# Patient Record
Sex: Male | Born: 1985
Health system: Southern US, Community
[De-identification: ages and names within clinical notes are randomized; demographics above are authoritative.]

## PROBLEM LIST (undated history)

## (undated) DIAGNOSIS — E669 Obesity, unspecified: Secondary | ICD-10-CM

## (undated) DIAGNOSIS — E7800 Pure hypercholesterolemia, unspecified: Secondary | ICD-10-CM

## (undated) DIAGNOSIS — G4733 Obstructive sleep apnea (adult) (pediatric): Secondary | ICD-10-CM

## (undated) DIAGNOSIS — F1910 Other psychoactive substance abuse, uncomplicated: Secondary | ICD-10-CM

## (undated) DIAGNOSIS — M1990 Unspecified osteoarthritis, unspecified site: Secondary | ICD-10-CM

## (undated) DIAGNOSIS — I10 Essential (primary) hypertension: Secondary | ICD-10-CM

## (undated) DIAGNOSIS — I509 Heart failure, unspecified: Secondary | ICD-10-CM

## (undated) DIAGNOSIS — J42 Unspecified chronic bronchitis: Secondary | ICD-10-CM

## (undated) DIAGNOSIS — Z9989 Dependence on other enabling machines and devices: Secondary | ICD-10-CM

## (undated) DIAGNOSIS — J189 Pneumonia, unspecified organism: Secondary | ICD-10-CM

## (undated) DIAGNOSIS — Z720 Tobacco use: Secondary | ICD-10-CM

## (undated) HISTORY — PX: NO PAST SURGERIES: SHX2092

## (undated) HISTORY — PX: OTHER SURGICAL HISTORY: SHX169

---
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
## 2004-01-04 ENCOUNTER — Emergency Department (HOSPITAL_COMMUNITY): Admission: EM | Admit: 2004-01-04 | Discharge: 2004-01-04 | Payer: Self-pay | Admitting: Emergency Medicine

## 2009-05-02 ENCOUNTER — Emergency Department (HOSPITAL_COMMUNITY): Admission: EM | Admit: 2009-05-02 | Discharge: 2009-05-02 | Payer: Self-pay | Admitting: Emergency Medicine

## 2009-05-04 ENCOUNTER — Emergency Department (HOSPITAL_COMMUNITY): Admission: EM | Admit: 2009-05-04 | Discharge: 2009-05-04 | Payer: Self-pay | Admitting: Emergency Medicine

## 2009-08-14 ENCOUNTER — Ambulatory Visit (HOSPITAL_BASED_OUTPATIENT_CLINIC_OR_DEPARTMENT_OTHER): Admission: RE | Admit: 2009-08-14 | Discharge: 2009-08-14 | Payer: Self-pay | Admitting: Internal Medicine

## 2009-08-22 ENCOUNTER — Ambulatory Visit: Payer: Self-pay | Admitting: Internal Medicine

## 2009-09-17 ENCOUNTER — Emergency Department (HOSPITAL_COMMUNITY): Admission: EM | Admit: 2009-09-17 | Discharge: 2009-09-17 | Payer: Self-pay | Admitting: Family Medicine

## 2010-04-14 ENCOUNTER — Emergency Department (HOSPITAL_COMMUNITY): Payer: Medicaid Other

## 2010-04-14 ENCOUNTER — Emergency Department (HOSPITAL_COMMUNITY)
Admission: EM | Admit: 2010-04-14 | Discharge: 2010-04-14 | Disposition: A | Discharge status: Home / Self Care | Payer: Medicaid Other | Attending: Emergency Medicine | Admitting: Emergency Medicine

## 2010-04-14 DIAGNOSIS — I10 Essential (primary) hypertension: Secondary | ICD-10-CM | POA: Insufficient documentation

## 2010-04-14 DIAGNOSIS — J40 Bronchitis, not specified as acute or chronic: Secondary | ICD-10-CM | POA: Insufficient documentation

## 2010-04-14 DIAGNOSIS — R062 Wheezing: Secondary | ICD-10-CM | POA: Insufficient documentation

## 2010-04-14 DIAGNOSIS — R059 Cough, unspecified: Secondary | ICD-10-CM | POA: Insufficient documentation

## 2010-04-14 DIAGNOSIS — R05 Cough: Secondary | ICD-10-CM | POA: Insufficient documentation

## 2011-09-25 DIAGNOSIS — J189 Pneumonia, unspecified organism: Secondary | ICD-10-CM

## 2011-09-25 HISTORY — DX: Pneumonia, unspecified organism: J18.9

## 2011-09-27 ENCOUNTER — Other Ambulatory Visit: Payer: Self-pay

## 2011-09-27 ENCOUNTER — Inpatient Hospital Stay (HOSPITAL_COMMUNITY)
Admission: EM | Admit: 2011-09-27 | Discharge: 2011-10-02 | DRG: 189 | Disposition: A | Discharge status: Home / Self Care | Payer: Medicaid Other | Attending: Internal Medicine | Admitting: Internal Medicine

## 2011-09-27 ENCOUNTER — Encounter (HOSPITAL_COMMUNITY): Payer: Self-pay

## 2011-09-27 ENCOUNTER — Emergency Department (HOSPITAL_COMMUNITY): Payer: Medicaid Other

## 2011-09-27 DIAGNOSIS — J9620 Acute and chronic respiratory failure, unspecified whether with hypoxia or hypercapnia: Principal | ICD-10-CM | POA: Diagnosis present

## 2011-09-27 DIAGNOSIS — F1910 Other psychoactive substance abuse, uncomplicated: Secondary | ICD-10-CM

## 2011-09-27 DIAGNOSIS — F17200 Nicotine dependence, unspecified, uncomplicated: Secondary | ICD-10-CM | POA: Diagnosis present

## 2011-09-27 DIAGNOSIS — R209 Unspecified disturbances of skin sensation: Secondary | ICD-10-CM | POA: Diagnosis present

## 2011-09-27 DIAGNOSIS — R0902 Hypoxemia: Secondary | ICD-10-CM

## 2011-09-27 DIAGNOSIS — D72829 Elevated white blood cell count, unspecified: Secondary | ICD-10-CM | POA: Diagnosis present

## 2011-09-27 DIAGNOSIS — Z6841 Body Mass Index (BMI) 40.0 and over, adult: Secondary | ICD-10-CM

## 2011-09-27 DIAGNOSIS — D751 Secondary polycythemia: Secondary | ICD-10-CM | POA: Diagnosis present

## 2011-09-27 DIAGNOSIS — E669 Obesity, unspecified: Secondary | ICD-10-CM

## 2011-09-27 DIAGNOSIS — J9622 Acute and chronic respiratory failure with hypercapnia: Secondary | ICD-10-CM

## 2011-09-27 DIAGNOSIS — I10 Essential (primary) hypertension: Secondary | ICD-10-CM | POA: Diagnosis present

## 2011-09-27 DIAGNOSIS — J209 Acute bronchitis, unspecified: Secondary | ICD-10-CM

## 2011-09-27 DIAGNOSIS — J189 Pneumonia, unspecified organism: Secondary | ICD-10-CM | POA: Diagnosis present

## 2011-09-27 DIAGNOSIS — R0602 Shortness of breath: Secondary | ICD-10-CM

## 2011-09-27 DIAGNOSIS — Z91199 Patient's noncompliance with other medical treatment and regimen due to unspecified reason: Secondary | ICD-10-CM

## 2011-09-27 DIAGNOSIS — J9600 Acute respiratory failure, unspecified whether with hypoxia or hypercapnia: Secondary | ICD-10-CM

## 2011-09-27 DIAGNOSIS — Z720 Tobacco use: Secondary | ICD-10-CM

## 2011-09-27 DIAGNOSIS — M129 Arthropathy, unspecified: Secondary | ICD-10-CM | POA: Diagnosis present

## 2011-09-27 DIAGNOSIS — F1410 Cocaine abuse, uncomplicated: Secondary | ICD-10-CM | POA: Diagnosis present

## 2011-09-27 DIAGNOSIS — Z9989 Dependence on other enabling machines and devices: Secondary | ICD-10-CM

## 2011-09-27 DIAGNOSIS — G4733 Obstructive sleep apnea (adult) (pediatric): Secondary | ICD-10-CM | POA: Diagnosis present

## 2011-09-27 DIAGNOSIS — J9601 Acute respiratory failure with hypoxia: Secondary | ICD-10-CM

## 2011-09-27 DIAGNOSIS — Z9119 Patient's noncompliance with other medical treatment and regimen: Secondary | ICD-10-CM

## 2011-09-27 DIAGNOSIS — Z79899 Other long term (current) drug therapy: Secondary | ICD-10-CM

## 2011-09-27 HISTORY — DX: Dependence on other enabling machines and devices: Z99.89

## 2011-09-27 HISTORY — DX: Other psychoactive substance abuse, uncomplicated: F19.10

## 2011-09-27 HISTORY — DX: Essential (primary) hypertension: I10

## 2011-09-27 HISTORY — DX: Unspecified osteoarthritis, unspecified site: M19.90

## 2011-09-27 HISTORY — DX: Obesity, unspecified: E66.9

## 2011-09-27 HISTORY — DX: Obstructive sleep apnea (adult) (pediatric): G47.33

## 2011-09-27 HISTORY — DX: Tobacco use: Z72.0

## 2011-09-27 LAB — CBC WITH DIFFERENTIAL/PLATELET
Basophils Absolute: 0.1 10*3/uL (ref 0.0–0.1)
Basophils Relative: 1 % (ref 0–1)
Eosinophils Absolute: 0.1 10*3/uL (ref 0.0–0.7)
HCT: 60 % — ABNORMAL HIGH (ref 39.0–52.0)
Hemoglobin: 19.8 g/dL — ABNORMAL HIGH (ref 13.0–17.0)
MCH: 31.5 pg (ref 26.0–34.0)
MCHC: 33 g/dL (ref 30.0–36.0)
Monocytes Absolute: 1.5 10*3/uL — ABNORMAL HIGH (ref 0.1–1.0)
Monocytes Relative: 12 % (ref 3–12)
Neutro Abs: 8.7 10*3/uL — ABNORMAL HIGH (ref 1.7–7.7)
Neutrophils Relative %: 68 % (ref 43–77)
RDW: 14.1 % (ref 11.5–15.5)

## 2011-09-27 LAB — BASIC METABOLIC PANEL
BUN: 11 mg/dL (ref 6–23)
Calcium: 9.3 mg/dL (ref 8.4–10.5)
Creatinine, Ser: 1.21 mg/dL (ref 0.50–1.35)
GFR calc Af Amer: >90 mL/min (ref >90)
GFR calc non Af Amer: 80 mL/min — ABNORMAL LOW (ref >90)

## 2011-09-27 LAB — POCT I-STAT 3, ART BLOOD GAS (G3+)
Acid-Base Excess: 6 mmol/L — ABNORMAL HIGH (ref 0.0–2.0)
Acid-Base Excess: 7 mmol/L — ABNORMAL HIGH (ref 0.0–2.0)
O2 Saturation: 84 %
TCO2: 39 mmol/L (ref 0–100)
TCO2: 40 mmol/L (ref 0–100)
pCO2 arterial: 72.2 mmHg (ref 35.0–45.0)
pH, Arterial: 7.306 — ABNORMAL LOW (ref 7.350–7.450)

## 2011-09-27 LAB — TROPONIN I: Troponin I: <0.3 ng/mL (ref <0.30)

## 2011-09-27 MED ORDER — DEXTROSE 5 % IV SOLN
1.0000 g | Freq: Once | INTRAVENOUS | Status: AC
  Administered 2011-09-27: 1 g via INTRAVENOUS
  Filled 2011-09-27: qty 10

## 2011-09-27 MED ORDER — IPRATROPIUM BROMIDE 0.02 % IN SOLN
0.5000 mg | Freq: Once | RESPIRATORY_TRACT | Status: AC
  Administered 2011-09-27: 0.5 mg via RESPIRATORY_TRACT
  Filled 2011-09-27: qty 2.5

## 2011-09-27 MED ORDER — ALBUTEROL SULFATE (5 MG/ML) 0.5% IN NEBU
5.0000 mg | INHALATION_SOLUTION | Freq: Once | RESPIRATORY_TRACT | Status: AC
  Administered 2011-09-27: 5 mg via RESPIRATORY_TRACT
  Filled 2011-09-27: qty 1

## 2011-09-27 MED ORDER — SODIUM CHLORIDE 0.9 % IV BOLUS (SEPSIS)
1000.0000 mL | Freq: Once | INTRAVENOUS | Status: AC
  Administered 2011-09-27: 1000 mL via INTRAVENOUS

## 2011-09-27 MED ORDER — DEXTROSE 5 % IV SOLN
500.0000 mg | Freq: Once | INTRAVENOUS | Status: AC
  Administered 2011-09-27: 500 mg via INTRAVENOUS
  Filled 2011-09-27: qty 500

## 2011-09-27 MED ORDER — IOHEXOL 350 MG/ML SOLN
100.0000 mL | Freq: Once | INTRAVENOUS | Status: AC | PRN
  Administered 2011-09-27: 100 mL via INTRAVENOUS

## 2011-09-27 MED ORDER — PREDNISONE 20 MG PO TABS
60.0000 mg | ORAL_TABLET | Freq: Once | ORAL | Status: AC
  Administered 2011-09-27: 60 mg via ORAL
  Filled 2011-09-27: qty 3

## 2011-09-27 MED ORDER — ALBUTEROL SULFATE (5 MG/ML) 0.5% IN NEBU
5.0000 mg | INHALATION_SOLUTION | RESPIRATORY_TRACT | Status: AC
  Administered 2011-09-27: 5 mg via RESPIRATORY_TRACT
  Filled 2011-09-27: qty 1

## 2011-09-27 NOTE — ED Notes (Signed)
Pt noted to be diaphoretic, vital WNL

## 2011-09-27 NOTE — ED Notes (Signed)
Orvan Falconer, Internal Medicine MD at bedside, phlebotomy drawing blood cultures

## 2011-09-27 NOTE — ED Provider Notes (Signed)
History     CSN: [id number]  Arrival date & time 09/27/11  1304   First MD Initiated Contact with Patient 09/27/11 1340      Chief Complaint  Patient presents with  . Shortness of Breath  . Hypertension    (Consider location/radiation/quality/duration/timing/severity/associated sxs/prior treatment) HPI Comments: Patient presents with cough chest congestion and shortness of breath has been going on for the last 2-3 days. He states the cough has been going on for about 2 weeks. Indeed he worsens then. He's noticed that his breathing is getting worse of the last 2 days. He has been taking albuterol inhalers but recently ran out of his inhaler this morning. He was previously on blood pressure medicine but had stopped taking it for about 2 months and just recently started taking it again about a week ago. He states he has subjective fever yesterday but denies any fever today. Denies any pain in his chest. Denies any leg pain or swelling. Denies any pleuritic-type pain. Denies any nausea vomiting or diarrhea.  Patient is a 26 y.o. male presenting with shortness of breath and hypertension.  Shortness of Breath  Associated symptoms include a fever, rhinorrhea, cough and shortness of breath. Pertinent negatives include no chest pain.  Hypertension Associated symptoms include shortness of breath. Pertinent negatives include no chest pain, no abdominal pain and no headaches.    Past Medical History  Diagnosis Date  . Arthritis   . Bronchitis   . Hypertension     History reviewed. No pertinent past surgical history.  No family history on file.  History  Substance Use Topics  . Smoking status: Former Games developer  . Smokeless tobacco: Not on file  . Alcohol Use: Yes     rarely      Review of Systems  Constitutional: Positive for fever and fatigue. Negative for chills and diaphoresis.  HENT: Positive for congestion and rhinorrhea. Negative for sneezing.   Eyes: Negative.   Respiratory:  Positive for cough and shortness of breath. Negative for chest tightness.   Cardiovascular: Negative for chest pain and leg swelling.  Gastrointestinal: Negative for nausea, vomiting, abdominal pain, diarrhea and blood in stool.  Genitourinary: Negative for frequency, hematuria, flank pain and difficulty urinating.  Musculoskeletal: Negative for back pain and arthralgias.  Skin: Negative for rash.  Neurological: Negative for dizziness, speech difficulty, weakness, numbness and headaches.    Allergies  Review of patient's allergies indicates no known allergies.  Home Medications   Current Outpatient Rx  Name Route Sig Dispense Refill  . AMITRIPTYLINE HCL 25 MG PO TABS Oral Take 25 mg by mouth at bedtime.    Marland Kitchen LISINOPRIL-HYDROCHLOROTHIAZIDE 20-12.5 MG PO TABS Oral Take 1 tablet by mouth daily.    Marland Kitchen METOPROLOL SUCCINATE ER 25 MG PO TB24 Oral Take 25 mg by mouth daily.      BP 149/100  Pulse 103  Temp 98.8 F (37.1 C) (Oral)  Resp 18  SpO2 93%  Physical Exam  Constitutional: He is oriented to person, place, and time. He appears well-developed and well-nourished.       The patient's initial oxygen saturation is 86% on room air in the mid 90s on nasal cannula 4 L per minute  HENT:  Head: Normocephalic and atraumatic.  Eyes: Pupils are equal, round, and reactive to light.  Neck: Normal range of motion. Neck supple.  Cardiovascular: Normal rate, regular rhythm and normal heart sounds.   Pulmonary/Chest: Effort normal. No respiratory distress. He has wheezes. He has no  rales. He exhibits no tenderness.       He has decreased breath sounds bilaterally with rhonchi and expiratory wheezing bilaterally  Abdominal: Soft. Bowel sounds are normal. There is no tenderness. There is no rebound and no guarding.  Musculoskeletal: Normal range of motion. He exhibits no edema and no tenderness.  Lymphadenopathy:    He has no cervical adenopathy.  Neurological: He is alert and oriented to person,  place, and time.  Skin: Skin is warm and dry. No rash noted.  Psychiatric: He has a normal mood and affect.    ED Course  Procedures (including critical care time)  Results for orders placed during the hospital encounter of 09/27/11  CBC WITH DIFFERENTIAL      Component Value Range   WBC 12.8 (*) 4.0 - 10.5 K/uL   RBC 6.28 (*) 4.22 - 5.81 MIL/uL   Hemoglobin 19.8 (*) 13.0 - 17.0 g/dL   HCT 09.8 (*) 11.9 - 14.7 %   MCV 95.5  78.0 - 100.0 fL   MCH 31.5  26.0 - 34.0 pg   MCHC 33.0  30.0 - 36.0 g/dL   RDW 82.9  56.2 - 13.0 %   Platelets 166  150 - 400 K/uL   Neutrophils Relative 68  43 - 77 %   Neutro Abs 8.7 (*) 1.7 - 7.7 K/uL   Lymphocytes Relative 19  12 - 46 %   Lymphs Abs 2.5  0.7 - 4.0 K/uL   Monocytes Relative 12  3 - 12 %   Monocytes Absolute 1.5 (*) 0.1 - 1.0 K/uL   Eosinophils Relative 1  0 - 5 %   Eosinophils Absolute 0.1  0.0 - 0.7 K/uL   Basophils Relative 1  0 - 1 %   Basophils Absolute 0.1  0.0 - 0.1 K/uL  BASIC METABOLIC PANEL      Component Value Range   Sodium 138  135 - 145 mEq/L   Potassium 4.9  3.5 - 5.1 mEq/L   Chloride 98  96 - 112 mEq/L   CO2 33 (*) 19 - 32 mEq/L   Glucose, Bld 107 (*) 70 - 99 mg/dL   BUN 11  6 - 23 mg/dL   Creatinine, Ser 8.65  0.50 - 1.35 mg/dL   Calcium 9.3  8.4 - 78.4 mg/dL   GFR calc non Af Amer 80 (*) >90 mL/min   GFR calc Af Amer >90  >90 mL/min  TROPONIN I      Component Value Range   Troponin I <0.30  <0.30 ng/mL   Dg Chest 2 View  09/27/2011  *RADIOLOGY REPORT*  Clinical Data: Respiratory distress.  High blood pressure.  Nausea and vomiting.  Weakness.  CHEST - 2 VIEW  Comparison: 04/14/2010.  Findings: Cardiopericardial silhouette upper limits of normal for projection.  No airspace disease.  No effusion.  Mediastinal contours appear within normal limits.  IMPRESSION: Borderline heart size.  No acute cardiopulmonary disease.   Original Report Authenticated By: Andreas Newport, M.D.       Date: 09/27/2011  Rate: 107   Rhythm: sinus tachycardia  QRS Axis: normal  Intervals: normal  ST/T Wave abnormalities: nonspecific ST/T changes  Conduction Disutrbances:none  Narrative Interpretation:   Old EKG Reviewed: none available   1. Shortness of breath       MDM  No evidence of pneumonia.  Pt improved after duoneb, but sats still drop into 70s off oxygen.  Will check CT chest to r/o PE.  Pt care turned  over to oncoming physician pending CT.  Pt will likely need to be admitted regardless given ongoing hypoxia.        Rolan Bucco, MD 09/27/11 [phone]

## 2011-09-27 NOTE — ED Notes (Addendum)
Per EMS, initial BP 220/140, wheezing to RUL, low O2 sats.  Pt placed on O2 4L via [place] with O2 sats increasing to 93%.  Per EMS, pt c/o recent cough and coughing up thick clear sputum.  Upon arrival to ED, O2 sats 67% on RA, 86% on 4L, Non-rebreather applied and sats to 99%.

## 2011-09-27 NOTE — ED Notes (Signed)
Patient transported to CT 

## 2011-09-27 NOTE — ED Notes (Signed)
Spoke with Douglas [name] re: O2 sat 88% on 6 L [place], will continue to monitor the pt, Respiratory to come to bedside to assess & complete ABG

## 2011-09-27 NOTE — H&P (Signed)
[facility] History and Physical  CAVAN BEARDEN  ZOX:[id number]  DOB: March 02, 1985   DOA: 09/27/2011   PCP:   Fleet Contras, [facility]   Chief Complaint:  Coughing for 2 weeks  HPI: Douglas [name] is an 26 y.o. male.   Morbidly obese African American gentleman with a history hypertension, asthma/bronchitis, obstructive sleep apnea on CPAP, presents to the emergency room with a two-week history of progressively worsening cough, or duct above clear to tan sputum, denies fever or chills. Denies any sick contacts. Denies any recent hospital or nursing home exposure.  In the emergency room patient required high flow oxygen to maintain his saturation, had a CT angiogram of the chest which revealed evidence of a pneumonia, a hospitalist service was called to assist with management.  She does a patient or use an inhaler for help with his breathing but does not didn't know the name of the inhaler. Denies recent use of an antibiotics.  He is sometimes noncompliant with his antihypertensive medication, and when that is so he tends to have lower extremity edema. He reports recent complaince.  Has been smoking regularly but says he is not quit as of 2 days ago.  Admits using cocaine 2 days ago, says this was a very first time.  Rewiew of Systems:  Other than noted above patient denies any problems on a complete review of  All systems negative except as marked bold or noted in the HPI;  Constitutional: Negative for malaise, fever and chills. ;  Eyes: Negative for eye pain, redness and discharge. ;  ENMT: Negative for ear pain, hoarseness, , sinus pressure and sore throat. ; nasal congestion Cardiovascular: Negative for chest pain, palpitations, diaphoresis, ;  Respiratory: Negative for , hemoptysis, and stridor. ;  Gastrointestinal: Negative for nausea, vomiting, diarrhea, constipation, abdominal pain, melena, blood in stool, hematemesis, jaundice and rectal bleeding. unusual  weight loss..   Genitourinary: Negative for frequency, dysuria, incontinence,flank pain and hematuria; Musculoskeletal: Negative for back pain and neck pain. Negative for swelling and trauma.;  Skin: . Negative for pruritus, rash, abrasions, bruising and skin lesion.; ulcerations Neuro: Negative for headache, lightheadedness and neck stiffness. Negative for weakness, altered level of consciousness , altered mental status, extremity weakness, burning feet, involuntary movement, seizure and syncope.  Psych: negative for anxiety, depression, insomnia, tearfulness, panic attacks, hallucinations, paranoia, suicidal or homicidal ideation    Past Medical History  Diagnosis Date  . Arthritis   . Bronchitis   . Hypertension   . Obesity   . OSA on CPAP   . Tobacco abuse   . Family history of asthma   . Substance abuse 8/31/213    cociane "first time use"    History reviewed. No pertinent past surgical history.  Medications:  HOME MEDS: Prior to Admission medications   Medication Sig Start Date End Date Taking? Authorizing Provider  amitriptyline (ELAVIL) 25 MG tablet Take 25 mg by mouth at bedtime.   Yes Historical Provider, MD  lisinopril-hydrochlorothiazide (PRINZIDE,ZESTORETIC) 20-12.5 MG per tablet Take 1 tablet by mouth daily.   Yes Historical Provider, MD  metoprolol succinate (TOPROL-XL) 25 MG 24 hr tablet Take 25 mg by mouth daily.   Yes Historical Provider, MD     Allergies:  No Known Allergies  Social History:   reports that he quit smoking 2 days ago. He does not have any smokeless tobacco history on file. He reports that he drinks about 7 ounces of alcohol per week. He reports that he  uses illicit drugs (Cocaine).  Family History: History reviewed. No pertinent family history.   Physical Exam: Filed Vitals:   09/27/11 1713 09/27/11 1745 09/27/11 1930 09/27/11 2000  BP: 153/110 137/97 147/93 151/102  Pulse: 88 95 96 95  Temp:      TempSrc:      Resp: 15 20 10  0    SpO2: 93% 90% 88% 92%   Blood pressure 151/102, pulse 95, temperature 98.8 F (37.1 C), temperature source Oral, resp. rate 0, SpO2 92.00%.  GEN:  Pleasant morbidly obese African American gentleman lying in the stretchee; tachypneic cooperative with exam PSYCH:  alert and oriented x4; does not appear anxious or depressed; affect is appropriate. HEENT: Mucous membranes pink and anicteric; PERRLA; EOM intact; no cervical lymphadenopathy nor thyromegaly or carotid bruit; no JVD; very thick neck Breasts:: Not examined CHEST WALL: No tenderness CHEST: Tachypneic, wheezing and rhonchi HEART: Regular rate and rhythm; no murmurs rubs or gallops BACK:; no CVA tenderness ABDOMEN: Obese, firm  non-tender; no masses, no organomegaly, normal abdominal bowel sounds;  no intertriginous candida. Rectal Exam: Not done EXTREMITIES: No bone or joint deformity;  no edema; no ulcerations. Genitalia: not examined PULSES: 2+ and symmetric SKIN: Normal hydration no rash or ulceration CNS: Cranial nerves 2-12 grossly intact no focal lateralizing neurologic deficit   Labs on Admission:  Basic Metabolic Panel:  Lab 09/27/11 9604 09/27/11 1355  NA -- 138  K 4.8 4.9  CL -- 98  CO2 -- 33*  GLUCOSE -- 107*  BUN -- 11  CREATININE -- 1.21  CALCIUM -- 9.3  MG 1.7 --  PHOS -- --   Liver Function Tests: No results found for this basename: AST:5,ALT:5,ALKPHOS:5,BILITOT:5,PROT:5,ALBUMIN:5 in the last 168 hours No results found for this basename: LIPASE:5,AMYLASE:5 in the last 168 hours No results found for this basename: AMMONIA:5 in the last 168 hours CBC:  Lab 09/27/11 1355  WBC 12.8*  NEUTROABS 8.7*  HGB 19.8*  HCT 60.0*  MCV 95.5  PLT 166   Cardiac Enzymes:  Lab 09/27/11 1356  CKTOTAL --  CKMB --  CKMBINDEX --  TROPONINI <0.30   BNP: No components found with this basename: POCBNP:5 CBG: No results found for this basename: GLUCAP:5 in the last 168 hours  Results for orders placed during  the hospital encounter of 09/27/11 (from the past 48 hour(s))  CBC WITH DIFFERENTIAL     Status: Abnormal   Collection Time   09/27/11  1:55 PM      Component Value Range Comment   WBC 12.8 (*) 4.0 - 10.5 K/uL    RBC 6.28 (*) 4.22 - 5.81 MIL/uL    Hemoglobin 19.8 (*) 13.0 - 17.0 g/dL    HCT 54.0 (*) 98.1 - 52.0 %    MCV 95.5  78.0 - 100.0 fL    MCH 31.5  26.0 - 34.0 pg    MCHC 33.0  30.0 - 36.0 g/dL    RDW 19.1  47.8 - 29.5 %    Platelets 166  150 - 400 K/uL    Neutrophils Relative 68  43 - 77 %    Neutro Abs 8.7 (*) 1.7 - 7.7 K/uL    Lymphocytes Relative 19  12 - 46 %    Lymphs Abs 2.5  0.7 - 4.0 K/uL    Monocytes Relative 12  3 - 12 %    Monocytes Absolute 1.5 (*) 0.1 - 1.0 K/uL    Eosinophils Relative 1  0 - 5 %  Eosinophils Absolute 0.1  0.0 - 0.7 K/uL    Basophils Relative 1  0 - 1 %    Basophils Absolute 0.1  0.0 - 0.1 K/uL   BASIC METABOLIC PANEL     Status: Abnormal   Collection Time   09/27/11  1:55 PM      Component Value Range Comment   Sodium 138  135 - 145 mEq/L    Potassium 4.9  3.5 - 5.1 mEq/L HEMOLYSIS AT THIS LEVEL MAY AFFECT RESULT   Chloride 98  96 - 112 mEq/L    CO2 33 (*) 19 - 32 mEq/L    Glucose, Bld 107 (*) 70 - 99 mg/dL    BUN 11  6 - 23 mg/dL    Creatinine, Ser 1.61  0.50 - 1.35 mg/dL    Calcium 9.3  8.4 - 09.6 mg/dL    GFR calc non Af Amer 80 (*) >90 mL/min    GFR calc Af Amer >90  >90 mL/min   TROPONIN I     Status: Normal   Collection Time   09/27/11  1:56 PM      Component Value Range Comment   Troponin I <0.30  <0.30 ng/mL   POTASSIUM     Status: Normal   Collection Time   09/27/11  4:46 PM      Component Value Range Comment   Potassium 4.8  3.5 - 5.1 mEq/L   MAGNESIUM     Status: Normal   Collection Time   09/27/11  4:46 PM      Component Value Range Comment   Magnesium 1.7  1.5 - 2.5 mg/dL   POCT I-STAT 3, BLOOD GAS (G3+)     Status: Abnormal   Collection Time   09/27/11  8:21 PM      Component Value Range Comment   pH, Arterial 7.310 (*)  7.350 - 7.450    pCO2 arterial 72.2 (*) 35.0 - 45.0 mmHg    pO2, Arterial 56.0 (*) 80.0 - 100.0 mmHg    Bicarbonate 36.4 (*) 20.0 - 24.0 mEq/L    TCO2 39  0 - 100 mmol/L    O2 Saturation 84.0      Acid-Base Excess 6.0 (*) 0.0 - 2.0 mmol/L    Collection site RADIAL, ALLEN'S TEST ACCEPTABLE      Drawn by Operator      Sample type ARTERIAL      Comment NOTIFIED PHYSICIAN        Radiological Exams on Admission: Dg Chest 2 View  09/27/2011  *RADIOLOGY REPORT*  Clinical Data: Respiratory distress.  High blood pressure.  Nausea and vomiting.  Weakness.  CHEST - 2 VIEW  Comparison: 04/14/2010.  Findings: Cardiopericardial silhouette upper limits of normal for projection.  No airspace disease.  No effusion.  Mediastinal contours appear within normal limits.  IMPRESSION: Borderline heart size.  No acute cardiopulmonary disease.   Original Report Authenticated By: Andreas Newport, M.D.    Ct Angio Chest Pe W/cm &/or Wo Cm  09/27/2011  *RADIOLOGY REPORT*  Clinical Data: Cough, short of breath, concern pulmonary embolism  CT ANGIOGRAPHY CHEST  Technique:  Multidetector CT imaging of the chest using the standard protocol during bolus administration of intravenous contrast. Multiplanar reconstructed images including MIPs were obtained and reviewed to evaluate the vascular anatomy.  Contrast: OMNIPAQUE IOHEXOL 350 MG/ML SOLN  Comparison: None  Findings: There are no filling defects with the pulmonary arteries to suggest acute pulmonary embolism.  No acute findings of the  aorta or great vessels.  No pericardial fluid.  Esophagus is normal.  No mediastinal lymphadenopathy.  Small amount residual thymus in the anterior mediastinum remains.  There is a branching nodular pattern in the right upper lobe (image 33).  There is a branching nodular pattern in the right lower lobe (image 60).  Similar findings the left lower lobe and lingula. Some of the largest nodules are in the medial lingula (image 46).  Limited view  of the upper abdomen is unremarkable. There is mild esophageal wall thickening distally.  Review of the skeleton is unremarkable.  IMPRESSION:  1.  Branching nodular pattern with the left and right upper and lower lobes most consistent with a multifocal infectious process. 2.  Recommend follow-up noncontrast thoracic CT after appropriate therapy to ensure resolution (6 to 8 weeks).  3.  No evidence of acute pulmonary embolism. 4.  Diffuse esophageal wall thickening could represent esophagitis.   Original Report Authenticated By: Genevive Bi, M.D.       Assessment/Plan Present on Admission:  .Acute respiratory failure .CAP (community acquired pneumonia) .Acute bronchitis .Substance abuse .OSA on CPAP, probable obesity hypoventilation syndrome  .Tobacco abuse .Obesity .Hypertension, uncontrolled  PLAN: Admit gentleman to step down unit for management of his CAP pneumonia, and respiratory support. Will also give nebulizers but will withhold steroids because of infection  Counsel on tobacco cessation; substance abuse; and weight management; Will avoid beta blockers for blood pressure control because of his wheezing and this cocaine use. Consider pulmonary consult if not improving. Will continue CPAP rather than BiPAP for the time being since his mentation is good.  Other plans as per orders.  Code Status: FULL CODE  Family Communication: Mother, no phone, f-Grandmother: [phone]  Disposition Plan:   Critical care time: 60 minutes.   Alonte Wulff Nocturnist [facility] Pager [phone]   09/27/2011, 8:51 PM

## 2011-09-27 NOTE — ED Notes (Signed)
Respiratory Therapy at bedside, pt vitals WNL, will continue to monitor, NAD

## 2011-09-27 NOTE — ED Notes (Signed)
Pt has had time to rest in bed.  Non-rebreather mask removed and [place] with 4L O2 applied.  O2 sats are stable at 98%.

## 2011-09-27 NOTE — ED Notes (Signed)
Spoke with Rhunette Croft, MD re: pt being diaphoretic, will continue to monitor O2 sat & breathing effort

## 2011-09-27 NOTE — ED Notes (Signed)
SaO2 87 to 88% on nasal cannula at 5 L/M.  Patient placed on a venturi mask at FiO2 0.50.  SaO2 is 94% with Venturi Mask.

## 2011-09-27 NOTE — ED Notes (Signed)
Spoke with Rhunette Croft, MD re: admin of antibiotics prior to blood cultures being drawn, awaiting blood cultures to be drawn prior to given Rocephin & Zithromax

## 2011-09-28 DIAGNOSIS — I517 Cardiomegaly: Secondary | ICD-10-CM

## 2011-09-28 DIAGNOSIS — J9620 Acute and chronic respiratory failure, unspecified whether with hypoxia or hypercapnia: Principal | ICD-10-CM

## 2011-09-28 DIAGNOSIS — J9600 Acute respiratory failure, unspecified whether with hypoxia or hypercapnia: Secondary | ICD-10-CM

## 2011-09-28 DIAGNOSIS — G4733 Obstructive sleep apnea (adult) (pediatric): Secondary | ICD-10-CM

## 2011-09-28 DIAGNOSIS — J209 Acute bronchitis, unspecified: Secondary | ICD-10-CM

## 2011-09-28 DIAGNOSIS — R0602 Shortness of breath: Secondary | ICD-10-CM

## 2011-09-28 LAB — CBC
MCH: 31.6 pg (ref 26.0–34.0)
MCHC: 32.2 g/dL (ref 30.0–36.0)
MCV: 98 fL (ref 78.0–100.0)
Platelets: 171 10*3/uL (ref 150–400)
RDW: 14.1 % (ref 11.5–15.5)

## 2011-09-28 LAB — BASIC METABOLIC PANEL
BUN: 9 mg/dL (ref 6–23)
CO2: 34 mEq/L — ABNORMAL HIGH (ref 19–32)
Calcium: 9 mg/dL (ref 8.4–10.5)
Creatinine, Ser: 0.95 mg/dL (ref 0.50–1.35)
Glucose, Bld: 103 mg/dL — ABNORMAL HIGH (ref 70–99)

## 2011-09-28 LAB — LEGIONELLA ANTIGEN, URINE

## 2011-09-28 LAB — RAPID URINE DRUG SCREEN, HOSP PERFORMED
Amphetamines: NOT DETECTED
Benzodiazepines: NOT DETECTED
Cocaine: POSITIVE — AB
Opiates: NOT DETECTED

## 2011-09-28 LAB — PRO B NATRIURETIC PEPTIDE: Pro B Natriuretic peptide (BNP): 751.5 pg/mL — ABNORMAL HIGH (ref 0–125)

## 2011-09-28 LAB — STREP PNEUMONIAE URINARY ANTIGEN: Strep Pneumo Urinary Antigen: NEGATIVE

## 2011-09-28 LAB — MRSA PCR SCREENING: MRSA by PCR: NEGATIVE

## 2011-09-28 MED ORDER — AMITRIPTYLINE HCL 25 MG PO TABS
25.0000 mg | ORAL_TABLET | Freq: Every day | ORAL | Status: DC
  Administered 2011-09-28 – 2011-10-01 (×5): 25 mg via ORAL
  Filled 2011-09-28 (×6): qty 1

## 2011-09-28 MED ORDER — SODIUM CHLORIDE 0.9 % IV SOLN
INTRAVENOUS | Status: DC
  Administered 2011-09-28: 100 mL via INTRAVENOUS
  Administered 2011-09-28: 22:00:00 via INTRAVENOUS

## 2011-09-28 MED ORDER — HYDRALAZINE HCL 20 MG/ML IJ SOLN
10.0000 mg | INTRAMUSCULAR | Status: DC | PRN
  Filled 2011-09-28: qty 0.5

## 2011-09-28 MED ORDER — BISACODYL 5 MG PO TBEC
5.0000 mg | DELAYED_RELEASE_TABLET | Freq: Every day | ORAL | Status: DC | PRN
  Filled 2011-09-28: qty 1

## 2011-09-28 MED ORDER — ALBUTEROL SULFATE (5 MG/ML) 0.5% IN NEBU: 2.5000 mg | INHALATION_SOLUTION | RESPIRATORY_TRACT | Status: DC | PRN

## 2011-09-28 MED ORDER — ONDANSETRON HCL 4 MG/2ML IJ SOLN: 4.0000 mg | INTRAMUSCULAR | Status: DC | PRN

## 2011-09-28 MED ORDER — CLONIDINE HCL 0.1 MG PO TABS
0.1000 mg | ORAL_TABLET | Freq: Three times a day (TID) | ORAL | Status: DC
  Administered 2011-09-28 – 2011-09-30 (×5): 0.1 mg via ORAL
  Filled 2011-09-28 (×7): qty 1

## 2011-09-28 MED ORDER — POTASSIUM CHLORIDE IN NACL 20-0.9 MEQ/L-% IV SOLN
INTRAVENOUS | Status: DC
  Administered 2011-09-28: 01:00:00 via INTRAVENOUS
  Filled 2011-09-28 (×3): qty 1000

## 2011-09-28 MED ORDER — LISINOPRIL 40 MG PO TABS
40.0000 mg | ORAL_TABLET | Freq: Every day | ORAL | Status: DC
  Administered 2011-09-28 – 2011-10-02 (×5): 40 mg via ORAL
  Filled 2011-09-28 (×6): qty 1

## 2011-09-28 MED ORDER — ENOXAPARIN SODIUM 100 MG/ML ~~LOC~~ SOLN
50.0000 mg | SUBCUTANEOUS | Status: DC
  Administered 2011-09-29: 50 mg via SUBCUTANEOUS
  Filled 2011-09-28 (×2): qty 1

## 2011-09-28 MED ORDER — DEXTROSE 5 % IV SOLN
1.0000 g | INTRAVENOUS | Status: DC
  Administered 2011-09-28 – 2011-10-01 (×4): 1 g via INTRAVENOUS
  Filled 2011-09-28 (×7): qty 10

## 2011-09-28 MED ORDER — IPRATROPIUM BROMIDE 0.02 % IN SOLN
0.5000 mg | RESPIRATORY_TRACT | Status: DC
  Administered 2011-09-28 (×4): 0.5 mg via RESPIRATORY_TRACT
  Filled 2011-09-28 (×3): qty 2.5

## 2011-09-28 MED ORDER — ALBUTEROL SULFATE (5 MG/ML) 0.5% IN NEBU
2.5000 mg | INHALATION_SOLUTION | Freq: Four times a day (QID) | RESPIRATORY_TRACT | Status: DC
  Administered 2011-09-28 – 2011-10-02 (×13): 2.5 mg via RESPIRATORY_TRACT
  Filled 2011-09-28 (×16): qty 0.5

## 2011-09-28 MED ORDER — IPRATROPIUM BROMIDE 0.02 % IN SOLN
0.5000 mg | RESPIRATORY_TRACT | Status: DC | PRN
  Filled 2011-09-28: qty 2.5

## 2011-09-28 MED ORDER — FLEET ENEMA 7-19 GM/118ML RE ENEM: 1.0000 | ENEMA | Freq: Every day | RECTAL | Status: DC | PRN

## 2011-09-28 MED ORDER — GUAIFENESIN ER 600 MG PO TB12
1200.0000 mg | ORAL_TABLET | Freq: Two times a day (BID) | ORAL | Status: DC
  Administered 2011-09-28 – 2011-10-02 (×10): 1200 mg via ORAL
  Filled 2011-09-28 (×11): qty 2

## 2011-09-28 MED ORDER — CLONIDINE HCL 0.1 MG PO TABS
0.1000 mg | ORAL_TABLET | Freq: Two times a day (BID) | ORAL | Status: DC
  Administered 2011-09-28 (×2): 0.1 mg via ORAL
  Filled 2011-09-28 (×3): qty 1

## 2011-09-28 MED ORDER — ACETAMINOPHEN 325 MG PO TABS
650.0000 mg | ORAL_TABLET | ORAL | Status: DC | PRN
  Administered 2011-09-29 – 2011-10-01 (×2): 650 mg via ORAL
  Filled 2011-09-28 (×2): qty 2

## 2011-09-28 MED ORDER — IPRATROPIUM BROMIDE 0.02 % IN SOLN
0.5000 mg | Freq: Four times a day (QID) | RESPIRATORY_TRACT | Status: DC
  Administered 2011-09-28 – 2011-10-02 (×13): 0.5 mg via RESPIRATORY_TRACT
  Filled 2011-09-28 (×16): qty 2.5

## 2011-09-28 MED ORDER — DEXTROSE 5 % IV SOLN: 500.0000 mg | INTRAVENOUS | Status: DC

## 2011-09-28 MED ORDER — ENOXAPARIN SODIUM 40 MG/0.4ML ~~LOC~~ SOLN
40.0000 mg | SUBCUTANEOUS | Status: DC
  Administered 2011-09-28: 40 mg via SUBCUTANEOUS
  Filled 2011-09-28: qty 0.4

## 2011-09-28 MED ORDER — ALBUTEROL SULFATE (5 MG/ML) 0.5% IN NEBU
2.5000 mg | INHALATION_SOLUTION | RESPIRATORY_TRACT | Status: DC
  Administered 2011-09-28 (×4): 2.5 mg via RESPIRATORY_TRACT
  Filled 2011-09-28 (×4): qty 0.5

## 2011-09-28 MED ORDER — DEXTROSE 5 % IV SOLN
500.0000 mg | INTRAVENOUS | Status: DC
  Administered 2011-09-29: 500 mg via INTRAVENOUS
  Filled 2011-09-28 (×2): qty 500

## 2011-09-28 NOTE — Progress Notes (Signed)
  Echocardiogram 2D Echocardiogram has been performed.  Douglas [name] 09/28/2011, 4:08 PM

## 2011-09-28 NOTE — Clinical Social Work Psychosocial (Signed)
     Clinical Social Work Department BRIEF PSYCHOSOCIAL ASSESSMENT 09/28/2011  Patient:  Douglas [name], Douglas [name]     Account Number:  [id number]     Admit date:  09/27/2011  Clinical Social Worker:  Margaree Mackintosh  Date/Time:  09/28/2011 11:00 AM  Referred by:  CSW  Date Referred:  09/28/2011 Referred for  Other - See comment   Other Referral:   Psychosocial Support.   Interview type:  Patient Other interview type:   With Mother, Father, and Sister present.    PSYCHOSOCIAL DATA Living Status:  FAMILY Admitted from facility:   Level of care:   Primary support name:   Primary support relationship to patient:   Degree of support available:    CURRENT CONCERNS Current Concerns  Other - See comment   Other Concerns:   Support.    SOCIAL WORK ASSESSMENT / PLAN Clinical Social Worker reviewed chart and staffed case during rounds.  CSW met with pt and family at bedside.  CSW introduced self, explained role, and provided support.  CSW provided opportunity for family to process difficult feelings-pt currently receiving a breathing treatment. Family shared that pt is "does not work" and has numerous members in pt's support system.   No needs identified at this time.  CSW updated RNCM.  CSW to sign off at this time.  Please re consult if needed.   Assessment/plan status:  No Further Intervention Required Other assessment/ plan:   Information/referral to community resources:   Potential HH.    PATIENTS/FAMILYS RESPONSE TO PLAN OF CARE: Pt currently recieving breathing treatment. Family was engaged and pleasant during conversation.  Family thanked CSW for intervention.

## 2011-09-28 NOTE — Progress Notes (Signed)
Pt to be transferred to room 2508.  Report called to Gerald Dexter, RN.  Will continue to monitor.

## 2011-09-28 NOTE — Progress Notes (Signed)
Pt placed on Nasal Auto CPAP. Pt wears at home but is not sure of settings.  O2 bleed in 10L now RT will continue to monitor and wean t/o night.

## 2011-09-28 NOTE — Progress Notes (Signed)
[facility] Progress Note [place] TEAM 1 - Stepdown/ICU TEAM   EASTYN DATTILO ZOX:[id number] DOB: [birth year] DOA: 09/27/2011 PCP: Dorrene German, MD  Brief narrative:  26 year old male patient with known severe obstructive sleep apnea confirmed on polysomnogram in 2011. He presented with a two-week history of progressive dyspnea and cough with productive sputum tan colored in nature. In the emergency department he underwent a CT of the chest which showed multilobar pneumonia but no evidence of PE. He was requiring noninvasive ventilatory support to maintain O2 saturations. Incidentally he admits to using cocaine for the first time 2 days prior. Because of the degree of respiratory failure requiring CPAP patient was admitted to the step down unit for further monitoring and treatment.  Assessment/Plan:  Acute respiratory failure with hypoxia 2/2: a) Acute bronchitis b) CAP (community acquired pneumonia)- multifocal changes on CT *Continue empiric antibiotics of Rocephin and Zithromax *Continue supportive care with oxygen, Lasix and nebulizer therapy *Make sure adequately hydrated  Acute and chronic respiratory failure with hypercapnia a) OSA on CPAP b) Obesity *2011 polysomnogram revealed severe sleep apnea with recommendations at that time for BiPAP. Unclear if patient on CPAP versus BiPAP at home but did not tolerate CPAP here *Continue BiPAP for now *No prior 2-D echocardiogram so we'll check to determine if has underlying cor pulmonale/right heart failure or pulmonary hypertension   Polycythemia secondary to hypoxia *Hemoglobin has decreased somewhat after hydration-baseline hemoglobin unknown at this time *Keep adequately hydrated and continue pharmacological DVT prophylaxis   Leukocytosis *Subtle decrease after admission and hydration - continue to monitor  Hypertension *Blood pressure well controlled - continue Catapres  Tobacco abuse/Substance abuse *Cessation  counseling  DVT prophylaxis: Lovenox subcutaneous daily Code Status: Full Family Communication: Directly with patient Disposition Plan: remain in step down  Consultants: None  Procedures: None  Antibiotics: Rocephin 9/3 >>> Zithromax 9/3 >>>  HPI/Subjective: Although he is awake he remains quite groggy but is able to answer questions. States breathing is better than prior to admission but not at baseline. No other complaints verbalized.   Objective: Blood pressure 152/88, pulse 75, temperature 97.8 F (36.6 C), temperature source Oral, resp. rate 19, height 5\' 4"  (1.626 m), weight 115.4 kg (254 lb 6.6 oz), SpO2 97.00%.  Intake/Output Summary (Last 24 hours) at 09/28/11 1250 Last data filed at 09/28/11 0900  Gross per 24 hour  Intake    800 ml  Output    400 ml  Net    400 ml     Exam: General: Mild respiratory distress, lethargic Lungs: Clear to auscultation bilaterally without wheezes or crackles, BiPAP Cardiovascular: Regular rate and rhythm without murmur gallop or rub normal S1 and S2, no peripheral edema Abdomen: Nontender, nondistended, soft, bowel sounds positive, no rebound, no ascites, no appreciable mass Musculoskeletal: No significant cyanosis, clubbing of extremities Neurological: Patient is sleepy but easily arousable and oriented, no focal neurological deficits appreciated  Data Reviewed: Basic Metabolic Panel:  Lab 09/28/11 8119 09/27/11 1646 09/27/11 1355  NA 140 -- 138  K 5.0 4.8 4.9  CL 99 -- 98  CO2 34* -- 33*  GLUCOSE 103* -- 107*  BUN 9 -- 11  CREATININE 0.95 -- 1.21  CALCIUM 9.0 -- 9.3  MG -- 1.7 --  PHOS -- -- --   CBC:  Lab 09/28/11 0720 09/27/11 1355  WBC 12.0* 12.8*  NEUTROABS -- 8.7*  HGB 18.6* 19.8*  HCT 57.7* 60.0*  MCV 98.0 95.5  PLT 171 166   Cardiac Enzymes:  Lab 09/27/11 1356  CKTOTAL --  CKMB --  CKMBINDEX --  TROPONINI <0.30   BNP (last 3 results)  Basename 09/28/11 0350  PROBNP 751.5*   CBG:  Lab  09/27/11 2355  GLUCAP 183*    Recent Results (from the past 240 hour(s))  MRSA PCR SCREENING     Status: Normal   Collection Time   09/28/11 12:20 AM      Component Value Range Status Comment   MRSA by PCR NEGATIVE  NEGATIVE Final      Studies:  Recent x-ray studies have been reviewed in detail by the Attending Physician  Scheduled Meds:  Reviewed in detail by the Attending Physician   Junious Silk, ANP [facility] Office  [phone] Pager [phone]  On-Call/Text Page:      [email or web address]      password [email or web address]  If 7PM-7AM, please contact night-coverage [email or web address] Password [email or web address] 09/28/2011, 12:50 PM   LOS: 1 day   I have personally examined this patient and reviewed the entire database. I have reviewed the above note, made any necessary editorial changes, and agree with its content.  Lonia Blood, MD [facility]

## 2011-09-28 NOTE — Progress Notes (Signed)
Non Invasive ventilation switched to BiPap due to decreasing sats on home type CPAP unit.  Patient tolerates well, will adjust pressures and FiO2 accordingly.

## 2011-09-28 NOTE — Progress Notes (Signed)
Unit RT at bedside assessing patient and Cpap machine

## 2011-09-28 NOTE — Progress Notes (Signed)
Pt sats dropping into 70's aand 80's on cpap. RT called to evaluate.

## 2011-09-29 LAB — CBC
MCH: 31.7 pg (ref 26.0–34.0)
MCHC: 31.3 g/dL (ref 30.0–36.0)
RDW: 14.2 % (ref 11.5–15.5)

## 2011-09-29 LAB — EXPECTORATED SPUTUM ASSESSMENT W GRAM STAIN, RFLX TO RESP C

## 2011-09-29 MED ORDER — AZITHROMYCIN 500 MG PO TABS
500.0000 mg | ORAL_TABLET | Freq: Every day | ORAL | Status: DC
  Administered 2011-09-29 – 2011-10-01 (×3): 500 mg via ORAL
  Filled 2011-09-29 (×4): qty 1

## 2011-09-29 MED ORDER — ENOXAPARIN SODIUM 60 MG/0.6ML ~~LOC~~ SOLN
50.0000 mg | SUBCUTANEOUS | Status: DC
  Administered 2011-09-29 – 2011-10-02 (×4): 50 mg via SUBCUTANEOUS
  Filled 2011-09-29 (×4): qty 0.6

## 2011-09-29 NOTE — Progress Notes (Signed)
Pt saturation dropped into 80's.  Upon assessment pt was  sleeping.  Pt requested to go back onto BIPAP while sleeping.  Pt was placed back on BIPAP.  Respiratory was contacted.  Nurse will continue to monitor.

## 2011-09-29 NOTE — Care Management Note (Signed)
  Page 1 of 1   09/29/2011     2:19:44 PM   CARE MANAGEMENT NOTE 09/29/2011  Patient:  Douglas [name], Douglas [name]   Account Number:  [id number]  Date Initiated:  09/28/2011  Documentation initiated by:  [facility]  Subjective/Objective Assessment:   Admitted to [place] with pneumonia - requiring Cpap.     DC Planning Services  CM consult      Comments:  ContactAngus, Amini Sister [phone]  09/29/11 1358 Wilkes Potvin RN MSN CCM Pt has home O2 and BiPAP through [employer].  Per pt, he is currently living with his dad and his assigned PCP is Dr. Fleet Contras.

## 2011-09-29 NOTE — Progress Notes (Signed)
PHARMACIST - PHYSICIAN COMMUNICATION DR:   Sharon Seller CONCERNING: Antibiotic IV to Oral Route Change Policy  RECOMMENDATION: This patient is receiving Azithromycin by the intravenous route.  Based on criteria approved by the Pharmacy and Therapeutics Committee, the antibiotic(s) is/are being converted to the equivalent oral dose form(s).   DESCRIPTION: These criteria include:  Patient being treated for a respiratory tract infection, urinary tract infection, or cellulitis  The patient is not neutropenic and does not exhibit a GI malabsorption state  The patient is eating (either orally or via tube) and/or has been taking other orally administered medications for a least 24 hours  The patient is improving clinically and has a Tmax < 100.5  If you have questions about this conversion, please contact the Pharmacy Department  []   [phone] )  [facility] [x]   [phone] )  [facility]  []   [phone] )  [facility] []   [phone] )  [facility]    Georgina Pillion, PharmD, BCPS Clinical Pharmacist Pager: [phone] 09/29/2011 3:51 PM

## 2011-09-29 NOTE — Progress Notes (Signed)
[facility] Progress Note [place] TEAM 1 - Stepdown/ICU TEAM   Naseem Varden Caridi JXB:[id number] DOB: [birth year] DOA: 09/27/2011 PCP: No primary provider on file.  Brief narrative:  26 year old male patient with known severe obstructive sleep apnea confirmed on polysomnogram in 2011. He presented with a two-week history of progressive dyspnea and cough with productive sputum tan colored in nature. In the emergency department he underwent a CT of the chest which showed multilobar pneumonia but no evidence of PE. He was requiring noninvasive ventilatory support to maintain O2 saturations. Incidentally he admits to using cocaine for the first time 2 days prior. Because of the degree of respiratory failure requiring CPAP patient was admitted to the step down unit for further monitoring and treatment.  Assessment/Plan:  Acute respiratory failure with hypoxia 2/2: a) Acute bronchitis b) CAP (community acquired pneumonia)- multifocal changes on CT *Cough has improved. Did not tolerate weaning of nasal cannula oxygen to 3 L per minute-this was associated with low oxygen saturations and dyspnea *Continue empiric antibiotics of Rocephin and Zithromax *Continue supportive care with oxygen, Lasix and nebulizer therapy *Make sure adequately hydrated  Acute and chronic respiratory failure with hypercapnia a) OSA on CPAP b) Obesity *2011 polysomnogram revealed severe sleep apnea with recommendations at that time for BiPAP. The patient is unclear whether he is on CPAP versus BiPAP at home -he also cannot remember home health agency-we'll ask case management to assist *Continue BiPAP for now *No prior 2-D echocardiogram so we'll check to determine if has underlying cor pulmonale/right heart failure or pulmonary hypertension   Polycythemia secondary to hypoxia *Hemoglobin has decreased somewhat after hydration-baseline hemoglobin unknown at this time *Keep adequately hydrated and continue  pharmacological DVT prophylaxis   Leukocytosis *Subtle decrease after admission and hydration - continue to monitor  Hypertension *Blood pressure well controlled - continue Catapres  Tobacco abuse/Substance abuse *Cessation counseling  DVT prophylaxis: Lovenox subcutaneous daily Code Status: Full Family Communication: Directly with patient Disposition Plan: remain in step down since requiring nocturnal BiPAP  Consultants: None  Procedures: None  Antibiotics: Rocephin 9/3 >>> Zithromax 9/3 >>>  HPI/Subjective: Much more alert today.  Unclear if on CPAP or BiPAP at home. Unable to recall the name of the home health agency. Endorsed shortness of breath when attempts were made to wean oxygen   Objective: Blood pressure 133/88, pulse 71, temperature 98.1 F (36.7 C), temperature source Oral, resp. rate 20, height 5\' 5"  (1.651 m), weight 117.209 kg (258 lb 6.4 oz), SpO2 93.00%.  Intake/Output Summary (Last 24 hours) at 09/29/11 1254 Last data filed at 09/29/11 1200  Gross per 24 hour  Intake   1850 ml  Output    800 ml  Net   1050 ml     Exam: General: Mild respiratory distress, lethargic Lungs: Clear to auscultation bilaterally without wheezes or crackles, BiPAP Cardiovascular: Regular rate and rhythm without murmur gallop or rub normal S1 and S2, no peripheral edema Abdomen: Nontender, nondistended, soft, bowel sounds positive, no rebound, no ascites, no appreciable mass Musculoskeletal: No significant cyanosis, clubbing of extremities Neurological: Patient is alert and oriented x3, no focal neurological deficits appreciated  Data Reviewed: Basic Metabolic Panel:  Lab 09/28/11 1308 09/27/11 1646 09/27/11 1355  NA 140 -- 138  K 5.0 4.8 4.9  CL 99 -- 98  CO2 34* -- 33*  GLUCOSE 103* -- 107*  BUN 9 -- 11  CREATININE 0.95 -- 1.21  CALCIUM 9.0 -- 9.3  MG -- 1.7 --  PHOS -- -- --  CBC:  Lab 09/29/11 0420 09/28/11 0720 09/27/11 1355  WBC 10.2 12.0* 12.8*    NEUTROABS -- -- 8.7*  HGB 17.7* 18.6* 19.8*  HCT 56.6* 57.7* 60.0*  MCV 101.3* 98.0 95.5  PLT 159 171 166   Cardiac Enzymes:  Lab 09/27/11 1356  CKTOTAL --  CKMB --  CKMBINDEX --  TROPONINI <0.30   BNP (last 3 results)  Basename 09/28/11 0350  PROBNP 751.5*   CBG:  Lab 09/27/11 2355  GLUCAP 183*    Recent Results (from the past 240 hour(s))  CULTURE, BLOOD (ROUTINE X 2)     Status: Normal (Preliminary result)   Collection Time   09/27/11  8:27 PM      Component Value Range Status Comment   Specimen Description BLOOD HAND LEFT   Final    Special Requests BOTTLES DRAWN AEROBIC ONLY 7CC   Final    Culture  Setup Time 09/28/2011 01:26   Final    Culture     Final    Value:        BLOOD CULTURE RECEIVED NO GROWTH TO DATE CULTURE WILL BE HELD FOR 5 DAYS BEFORE ISSUING A FINAL NEGATIVE REPORT   Report Status PENDING   Incomplete   CULTURE, BLOOD (ROUTINE X 2)     Status: Normal (Preliminary result)   Collection Time   09/27/11  8:52 PM      Component Value Range Status Comment   Specimen Description BLOOD LEFT HAND   Final    Special Requests BOTTLES DRAWN AEROBIC ONLY 5CC   Final    Culture  Setup Time 09/28/2011 01:25   Final    Culture     Final    Value:        BLOOD CULTURE RECEIVED NO GROWTH TO DATE CULTURE WILL BE HELD FOR 5 DAYS BEFORE ISSUING A FINAL NEGATIVE REPORT   Report Status PENDING   Incomplete   MRSA PCR SCREENING     Status: Normal   Collection Time   09/28/11 12:20 AM      Component Value Range Status Comment   MRSA by PCR NEGATIVE  NEGATIVE Final   CULTURE, EXPECTORATED SPUTUM-ASSESSMENT     Status: Normal   Collection Time   09/29/11 10:43 AM      Component Value Range Status Comment   Specimen Description SPUTUM   Final    Special Requests NONE   Final    Sputum evaluation     Final    Value: MICROSCOPIC FINDINGS SUGGEST THAT THIS SPECIMEN IS NOT REPRESENTATIVE OF LOWER RESPIRATORY SECRETIONS. PLEASE RECOLLECT.     CALLED TO B MCMILLAN ON 2500  09/29/11 1125 BY K SCHULTZ   Report Status 09/29/2011 FINAL   Final      Studies:  Recent x-ray studies have been reviewed in detail by the Attending Physician  Scheduled Meds:  Reviewed in detail by the Attending Physician   Junious Silk, ANP [facility] Office  [phone] Pager [phone]  On-Call/Text Page:      [email or web address]      password [email or web address]  If 7PM-7AM, please contact night-coverage [email or web address] Password [email or web address] 09/29/2011, 12:54 PM   LOS: 2 days   I have reviewed the chart, examined the patient and discussed the plan with the patient who is anxious to go home. I have modified the above note and agree with it.   Calvert Cantor, MD [phone]

## 2011-09-30 MED ORDER — HYDRALAZINE HCL 20 MG/ML IJ SOLN
10.0000 mg | Freq: Four times a day (QID) | INTRAMUSCULAR | Status: DC | PRN
  Administered 2011-09-30: 10 mg via INTRAVENOUS
  Filled 2011-09-30: qty 0.5

## 2011-09-30 MED ORDER — HYDRALAZINE HCL 20 MG/ML IJ SOLN
20.0000 mg | Freq: Four times a day (QID) | INTRAMUSCULAR | Status: DC | PRN
  Administered 2011-10-01 (×3): 20 mg via INTRAVENOUS
  Filled 2011-09-30 (×3): qty 1

## 2011-09-30 MED ORDER — HYDROCHLOROTHIAZIDE 12.5 MG PO CAPS
12.5000 mg | ORAL_CAPSULE | Freq: Every day | ORAL | Status: DC
  Administered 2011-09-30 – 2011-10-02 (×3): 12.5 mg via ORAL
  Filled 2011-09-30 (×3): qty 1

## 2011-09-30 MED ORDER — METOPROLOL SUCCINATE ER 25 MG PO TB24
25.0000 mg | ORAL_TABLET | Freq: Every day | ORAL | Status: DC
  Administered 2011-09-30 – 2011-10-02 (×3): 25 mg via ORAL
  Filled 2011-09-30 (×3): qty 1

## 2011-09-30 MED ORDER — BIOTENE DRY MOUTH MT LIQD
15.0000 mL | Freq: Two times a day (BID) | OROMUCOSAL | Status: DC
  Administered 2011-09-30 – 2011-10-01 (×4): 15 mL via OROMUCOSAL

## 2011-09-30 MED ORDER — HYDRALAZINE HCL 20 MG/ML IJ SOLN
10.0000 mg | Freq: Once | INTRAMUSCULAR | Status: DC
  Filled 2011-09-30: qty 0.5

## 2011-09-30 NOTE — Progress Notes (Signed)
Pt was transported to 6N28 at approximately 1600.  Nurse called report to [facility] and provided clarity about pt recent BP diastolic trends and interventions.  Pt was alert and oriented, 3 L [place], NS IVF infusing at 10, transported by way of wheelchair.  Nurse contacted family to inform of transfer. All pt belongings were transported with him during transfer.  At the time of transport there were no questions nor concerns that were unanswered.  Vitals prior to transfer are documented in doc flow sheets.

## 2011-09-30 NOTE — Progress Notes (Addendum)
1516:  Contacted MD to inform of BP since hydralazine administration.  Per MD; pt can be transferred now to Med Surg.   1330:  Nurse contacted MD to update on diastolic BP >100.  Pt is asymptomatic.  MD instructed nurse to administer PRN Hydralazine.  Nurse administered as ordered and will continue to monitor.

## 2011-09-30 NOTE — Progress Notes (Signed)
[facility] Progress Note [place] TEAM 1 - Stepdown/ICU TEAM   Douglas [name] WUX:[id number] DOB: [birth year] DOA: 09/27/2011 PCP: No primary provider on file.  Brief narrative:  26 year old male patient with known severe obstructive sleep apnea confirmed on polysomnogram in 2011. He presented with a two-week history of progressive dyspnea and cough with productive sputum tan colored in nature. In the emergency department he underwent a CT of the chest which showed multilobar pneumonia but no evidence of PE. He was requiring noninvasive ventilatory support to maintain O2 saturations. Incidentally he admits to using cocaine for the first time 2 days prior. Because of the degree of respiratory failure requiring CPAP patient was admitted to the step down unit for further monitoring and treatment.  Assessment/Plan:  Acute respiratory failure with hypoxia 2/2: a) Acute bronchitis b) ?CAP (community acquired pneumonia)- multifocal changes on CT *Cough has improved *Continues to require daytime oxygen which is not his baseline *Continue empiric antibiotics of Rocephin and Zithromax *Continue supportive care with oxygen and nebulizer therapy *Mobilize - attempt to wean O2  Acute and chronic respiratory failure with hypercapnia a) OSA on CPAP b) Obesity *2011 polysomnogram revealed severe sleep apnea with recommendations at that time for BiPAP. The patient is unclear whether he is on CPAP versus BiPAP at home -he also cannot remember home health agency-we'll ask case management to assist *Continue BiPAP for now QHS *2-D echocardiogram this admission demonstrates normal systolic function and moderate LVH but more importantly right ventricle was moderately dilated right atrium was mildly dilated consistent with sequelae of sleep apnea.   Polycythemia secondary to hypoxia *Hemoglobin has decreased somewhat after hydration-baseline hemoglobin unknown at this time *Keep adequately hydrated  and continue pharmacological DVT prophylaxis  Fingertip numbness *No true focal neurological deficits on exam *Old laboratory data reviewed and previous B12 level low normal so we'll repeat an anemia panel *Suspect patient may have peripheral neuropathy related to B12 deficiency   Leukocytosis *Subtle decrease after admission and hydration - continue to monitor  Hypertension *Blood pressure not well controlled including diastolic blood pressure *Plan to continue home lisinopril but add back hydrochlorothiazide and KVO IV fluid *Will discontinue Catapres patch that was started this admission and resume home metoprolol *Provide when necessary hydralazine  Tobacco abuse/Substance abuse *Cessation counseling  DVT prophylaxis: Lovenox subcutaneous daily Code Status: Full Family Communication: Directly with patient Disposition Plan: transfer to medical floor - continue to wean to room air O2 in prep for d/c home in 48+hrs? Consultants: None  Procedures: None  Antibiotics: Rocephin 9/3 >>> Zithromax 9/3 >>>  HPI/Subjective: Primary complaint at this point is related to numbness at tips of fingers. No other neurological symptoms. No other complaints.   Objective: Blood pressure 152/102, pulse 70, temperature 98 F (36.7 C), temperature source Oral, resp. rate 8, height 5\' 5"  (1.651 m), weight 118.7 kg (261 lb 11 oz), SpO2 91.00%.  Intake/Output Summary (Last 24 hours) at 09/30/11 1254 Last data filed at 09/30/11 1200  Gross per 24 hour  Intake   1480 ml  Output   1850 ml  Net   -370 ml     Exam: General: Mild respiratory distress but alert Lungs: Clear to auscultation bilaterally without wheezes or crackles, daytime oxygen averaging between 3 and 4 L per minute Cardiovascular: Regular rate and rhythm without murmur gallop or rub normal S1 and S2, no peripheral edema Abdomen: Nontender, nondistended, soft, bowel sounds positive, no rebound, no ascites, no appreciable  mass Musculoskeletal: No significant cyanosis, clubbing  of extremities Neurological: Patient is alert and oriented x3, no focal neurological deficits appreciated  Data Reviewed: Basic Metabolic Panel:  Lab 09/28/11 1610 09/27/11 1646 09/27/11 1355  NA 140 -- 138  K 5.0 4.8 4.9  CL 99 -- 98  CO2 34* -- 33*  GLUCOSE 103* -- 107*  BUN 9 -- 11  CREATININE 0.95 -- 1.21  CALCIUM 9.0 -- 9.3  MG -- 1.7 --  PHOS -- -- --   CBC:  Lab 09/29/11 0420 09/28/11 0720 09/27/11 1355  WBC 10.2 12.0* 12.8*  NEUTROABS -- -- 8.7*  HGB 17.7* 18.6* 19.8*  HCT 56.6* 57.7* 60.0*  MCV 101.3* 98.0 95.5  PLT 159 171 166   Cardiac Enzymes:  Lab 09/27/11 1356  CKTOTAL --  CKMB --  CKMBINDEX --  TROPONINI <0.30   BNP (last 3 results)  Basename 09/28/11 0350  PROBNP 751.5*   CBG:  Lab 09/27/11 2355  GLUCAP 183*    Recent Results (from the past 240 hour(s))  CULTURE, BLOOD (ROUTINE X 2)     Status: Normal (Preliminary result)   Collection Time   09/27/11  8:27 PM      Component Value Range Status Comment   Specimen Description BLOOD HAND LEFT   Final    Special Requests BOTTLES DRAWN AEROBIC ONLY 7CC   Final    Culture  Setup Time 09/28/2011 01:26   Final    Culture     Final    Value:        BLOOD CULTURE RECEIVED NO GROWTH TO DATE CULTURE WILL BE HELD FOR 5 DAYS BEFORE ISSUING A FINAL NEGATIVE REPORT   Report Status PENDING   Incomplete   CULTURE, BLOOD (ROUTINE X 2)     Status: Normal (Preliminary result)   Collection Time   09/27/11  8:52 PM      Component Value Range Status Comment   Specimen Description BLOOD LEFT HAND   Final    Special Requests BOTTLES DRAWN AEROBIC ONLY 5CC   Final    Culture  Setup Time 09/28/2011 01:25   Final    Culture     Final    Value:        BLOOD CULTURE RECEIVED NO GROWTH TO DATE CULTURE WILL BE HELD FOR 5 DAYS BEFORE ISSUING A FINAL NEGATIVE REPORT   Report Status PENDING   Incomplete   MRSA PCR SCREENING     Status: Normal   Collection Time    09/28/11 12:20 AM      Component Value Range Status Comment   MRSA by PCR NEGATIVE  NEGATIVE Final   CULTURE, EXPECTORATED SPUTUM-ASSESSMENT     Status: Normal   Collection Time   09/29/11 10:43 AM      Component Value Range Status Comment   Specimen Description SPUTUM   Final    Special Requests NONE   Final    Sputum evaluation     Final    Value: MICROSCOPIC FINDINGS SUGGEST THAT THIS SPECIMEN IS NOT REPRESENTATIVE OF LOWER RESPIRATORY SECRETIONS. PLEASE RECOLLECT.     CALLED TO B MCMILLAN ON 2500 09/29/11 1125 BY K SCHULTZ   Report Status 09/29/2011 FINAL   Final      Studies:  Recent x-ray studies have been reviewed in detail by the Attending Physician  Scheduled Meds:  Reviewed in detail by the Attending Physician   Junious Silk, ANP [facility] Office  [phone] Pager [phone]  On-Call/Text Page:      [email or web address]  password [email or web address]  If 7PM-7AM, please contact night-coverage [email or web address] Password [email or web address] 09/30/2011, 12:54 PM   LOS: 3 days   I have personally examined this patient and reviewed the entire database. I have reviewed the above note, made any necessary editorial changes, and agree with its content.  Lonia Blood, MD [facility]

## 2011-09-30 NOTE — Progress Notes (Signed)
Contacted MD regarding pt diastolic trend in recent BP's.  MD placed orders, nurse will administer orders as specified. Nurse will continue to monitor.

## 2011-10-01 DIAGNOSIS — I10 Essential (primary) hypertension: Secondary | ICD-10-CM

## 2011-10-01 LAB — BASIC METABOLIC PANEL
CO2: 39 mEq/L — ABNORMAL HIGH (ref 19–32)
Calcium: 9.5 mg/dL (ref 8.4–10.5)
Creatinine, Ser: 0.88 mg/dL (ref 0.50–1.35)
GFR calc Af Amer: >90 mL/min (ref >90)
GFR calc non Af Amer: >90 mL/min (ref >90)
Sodium: 139 mEq/L (ref 135–145)

## 2011-10-01 LAB — CBC
MCV: 97.3 fL (ref 78.0–100.0)
Platelets: 183 10*3/uL (ref 150–400)
RBC: 5.57 MIL/uL (ref 4.22–5.81)
RDW: 13.8 % (ref 11.5–15.5)
WBC: 9.4 10*3/uL (ref 4.0–10.5)

## 2011-10-01 NOTE — Progress Notes (Signed)
Pt wants to put himself on and off machuine and says he knows how to do so.  i told him he can call anytime for [place] to come back if he has any problems.

## 2011-10-01 NOTE — Progress Notes (Signed)
Placed pt. On Cpap via full face mask. Auto titrate settings (max-20.00 min- 13.00) per pt. Comformt.  Pt. O2 Saturation is dropping and maintaining in the mid 80;s.  RT added 2 lpm O2 due to desaturation episodes.

## 2011-10-01 NOTE — Progress Notes (Signed)
[facility] Progress Note    Douglas [name] JXB:[id number] DOB: 1985/08/30 DOA: 09/27/2011 PCP: No primary provider on file.  Brief narrative:  26 year old male patient with known severe obstructive sleep apnea confirmed on polysomnogram in 2011. He presented with a two-week history of progressive dyspnea and cough with productive sputum tan colored in nature. In the emergency department he underwent a CT of the chest which showed multilobar pneumonia but no evidence of PE. He was requiring noninvasive ventilatory support to maintain O2 saturations. Incidentally he admits to using cocaine for the first time 2 days prior. Because of the degree of respiratory failure requiring CPAP patient was admitted to the step down unit for further monitoring and treatment.  Assessment/Plan:  Acute respiratory failure with hypoxia 2/2: a) Acute bronchitis b) ?CAP (community acquired pneumonia)- multifocal changes on CT *Cough has improved *Continues to require daytime oxygen which is not his baseline *Continue empiric antibiotics of Rocephin and Zithromax *Continue supportive care with oxygen and nebulizer therapy *Mobilize - attempt to wean O2  Acute and chronic respiratory failure with hypercapnia a) OSA on CPAP b) Obesity *2011 polysomnogram revealed severe sleep apnea with recommendations at that time for BiPAP. The patient is unclear whether he is on CPAP versus BiPAP at home -he also cannot remember home health agency-we'll ask case management to assist *Continue BiPAP for now QHS *2-D echocardiogram this admission demonstrates normal systolic function and moderate LVH but more importantly right ventricle was moderately dilated right atrium was mildly dilated consistent with sequelae of sleep apnea.   Polycythemia secondary to hypoxia *Hemoglobin has decreased somewhat after hydration-baseline hemoglobin unknown at this time *Keep adequately hydrated and continue pharmacological DVT  prophylaxis  Fingertip numbness *No true focal neurological deficits on exam *Old laboratory data reviewed and previous B12 level low normal so we'll repeat an anemia panel *Suspect patient may have peripheral neuropathy related to B12 deficiency   Leukocytosis *Subtle decrease after admission and hydration - continue to monitor  Hypertension *Blood pressure not well controlled including diastolic blood pressure *Plan to continue home lisinopril but add back hydrochlorothiazide and KVO IV fluid *Will discontinue Catapres patch that was started this admission and resume home metoprolol *Provide when necessary hydralazine  Tobacco abuse/Substance abuse *Cessation counseling  DVT prophylaxis: Lovenox subcutaneous daily Code Status: Full Family Communication: Directly with patient Disposition Plan: transfer to medical floor - continue to wean to room air O2 in prep for d/c home in 48+hrs? Consultants: None  Procedures: None  Antibiotics: Rocephin 9/3 >>> Zithromax 9/3 >>>  HPI/Subjective: Primary complaint at this point is related to numbness at tips of fingers. No other neurological symptoms. No other complaints.   Objective: Blood pressure 158/92, pulse 86, temperature 98.1 F (36.7 C), temperature source Oral, resp. rate 18, height 5\' 5"  (1.651 m), weight 120.203 kg (265 lb), SpO2 94.00%.  Intake/Output Summary (Last 24 hours) at 10/01/11 2137 Last data filed at 10/01/11 1830  Gross per 24 hour  Intake    615 ml  Output      0 ml  Net    615 ml     Exam: General: Mild respiratory distress but alert Lungs: Clear to auscultation bilaterally without wheezes or crackles, daytime oxygen averaging between 3 and 4 L per minute Cardiovascular: Regular rate and rhythm without murmur gallop or rub normal S1 and S2, no peripheral edema Abdomen: Nontender, nondistended, soft, bowel sounds positive, no rebound, no ascites, no appreciable mass Musculoskeletal: No significant  cyanosis, clubbing of extremities Neurological:  Patient is alert and oriented x3, no focal neurological deficits appreciated  Data Reviewed: Basic Metabolic Panel:  Lab 10/01/11 9562 09/28/11 0350 09/27/11 1646 09/27/11 1355  NA 139 140 -- 138  K 3.8 5.0 4.8 4.9  CL 95* 99 -- 98  CO2 39* 34* -- 33*  GLUCOSE 86 103* -- 107*  BUN 9 9 -- 11  CREATININE 0.88 0.95 -- 1.21  CALCIUM 9.5 9.0 -- 9.3  MG -- -- 1.7 --  PHOS -- -- -- --   CBC:  Lab 10/01/11 0500 09/29/11 0420 09/28/11 0720 09/27/11 1355  WBC 9.4 10.2 12.0* 12.8*  NEUTROABS -- -- -- 8.7*  HGB 17.5* 17.7* 18.6* 19.8*  HCT 54.2* 56.6* 57.7* 60.0*  MCV 97.3 101.3* 98.0 95.5  PLT 183 159 171 166   Cardiac Enzymes:  Lab 09/27/11 1356  CKTOTAL --  CKMB --  CKMBINDEX --  TROPONINI <0.30   BNP (last 3 results)  Basename 09/28/11 0350  PROBNP 751.5*   CBG:  Lab 09/27/11 2355  GLUCAP 183*    Recent Results (from the past 240 hour(s))  CULTURE, BLOOD (ROUTINE X 2)     Status: Normal (Preliminary result)   Collection Time   09/27/11  8:27 PM      Component Value Range Status Comment   Specimen Description BLOOD HAND LEFT   Final    Special Requests BOTTLES DRAWN AEROBIC ONLY 7CC   Final    Culture  Setup Time 09/28/2011 01:26   Final    Culture     Final    Value:        BLOOD CULTURE RECEIVED NO GROWTH TO DATE CULTURE WILL BE HELD FOR 5 DAYS BEFORE ISSUING A FINAL NEGATIVE REPORT   Report Status PENDING   Incomplete   CULTURE, BLOOD (ROUTINE X 2)     Status: Normal (Preliminary result)   Collection Time   09/27/11  8:52 PM      Component Value Range Status Comment   Specimen Description BLOOD LEFT HAND   Final    Special Requests BOTTLES DRAWN AEROBIC ONLY 5CC   Final    Culture  Setup Time 09/28/2011 01:25   Final    Culture     Final    Value:        BLOOD CULTURE RECEIVED NO GROWTH TO DATE CULTURE WILL BE HELD FOR 5 DAYS BEFORE ISSUING A FINAL NEGATIVE REPORT   Report Status PENDING   Incomplete   MRSA PCR  SCREENING     Status: Normal   Collection Time   09/28/11 12:20 AM      Component Value Range Status Comment   MRSA by PCR NEGATIVE  NEGATIVE Final   CULTURE, EXPECTORATED SPUTUM-ASSESSMENT     Status: Normal   Collection Time   09/29/11 10:43 AM      Component Value Range Status Comment   Specimen Description SPUTUM   Final    Special Requests NONE   Final    Sputum evaluation     Final    Value: MICROSCOPIC FINDINGS SUGGEST THAT THIS SPECIMEN IS NOT REPRESENTATIVE OF LOWER RESPIRATORY SECRETIONS. PLEASE RECOLLECT.     CALLED TO B MCMILLAN ON 2500 09/29/11 1125 BY K SCHULTZ   Report Status 09/29/2011 FINAL   Final     Kathlen Mody, MD [facility] On-Call/Text Page:      [email or web address]      password [email or web address]  If 7PM-7AM, please contact night-coverage [email or web address] Password [facility] 10/01/2011, 9:37 PM  LOS: 4 days

## 2011-10-02 DIAGNOSIS — R0902 Hypoxemia: Secondary | ICD-10-CM

## 2011-10-02 DIAGNOSIS — J189 Pneumonia, unspecified organism: Secondary | ICD-10-CM

## 2011-10-02 MED ORDER — MOXIFLOXACIN HCL 400 MG PO TABS: 400.0000 mg | ORAL_TABLET | Freq: Every day | ORAL | Status: AC

## 2011-10-02 MED ORDER — ALBUTEROL SULFATE (5 MG/ML) 0.5% IN NEBU: 2.5000 mg | INHALATION_SOLUTION | RESPIRATORY_TRACT | Status: DC | PRN

## 2011-10-02 MED ORDER — METOPROLOL SUCCINATE ER 50 MG PO TB24: 50.0000 mg | ORAL_TABLET | Freq: Every day | ORAL | Status: DC

## 2011-10-02 MED ORDER — GUAIFENESIN ER 600 MG PO TB12: 1200.0000 mg | ORAL_TABLET | Freq: Two times a day (BID) | ORAL | Status: AC

## 2011-10-02 MED ORDER — METHYLPREDNISOLONE 4 MG PO KIT: PACK | ORAL | Status: AC

## 2011-10-02 MED ORDER — HYDROCHLOROTHIAZIDE 12.5 MG PO CAPS: 12.5000 mg | ORAL_CAPSULE | Freq: Every day | ORAL | Status: DC

## 2011-10-02 MED ORDER — BISACODYL 5 MG PO TBEC: 5.0000 mg | DELAYED_RELEASE_TABLET | Freq: Every day | ORAL | Status: AC | PRN

## 2011-10-02 MED ORDER — LISINOPRIL 40 MG PO TABS: 40.0000 mg | ORAL_TABLET | Freq: Every day | ORAL | Status: DC

## 2011-10-02 MED ORDER — IPRATROPIUM BROMIDE 0.02 % IN SOLN: 0.5000 mg | Freq: Four times a day (QID) | RESPIRATORY_TRACT | Status: DC

## 2011-10-02 NOTE — Discharge Summary (Signed)
Physician Discharge Summary  Douglas [name] YQM:[id number] DOB: [birth year] DOA: 09/27/2011  PCP: No primary provider on file.  Admit date: 09/27/2011 Discharge date: 10/02/2011  Recommendations for Outpatient Follow-up:  CT CHEST in 6 to 8 weeks to evaluate for resolution of the pneumonia.   Discharge Diagnoses:  Principal Problem:  *Acute respiratory failure with hypoxia Active Problems:  Hypertension  Obesity  OSA on CPAP  Tobacco abuse  Substance abuse  CAP (community acquired pneumonia)- multifocal changes on CT  Acute bronchitis  Acute and chronic respiratory failure with hypercapnia  Leukocytosis  Polycythemia secondary to hypoxia   Discharge Condition:stable  Diet recommendation: low sodium diet  Filed Weights   10/01/11 0416 10/01/11 0500 10/02/11 0621  Weight: 118.842 kg (262 lb) 120.203 kg (265 lb) 117.799 kg (259 lb 11.2 oz)    History of present illness:   26 year old male patient with known severe obstructive sleep apnea confirmed on polysomnogram in 2011. He presented with a two-week history of progressive dyspnea and cough with productive sputum tan colored in nature. In the emergency department he underwent a CT of the chest which showed multilobar pneumonia but no evidence of PE. He was requiring noninvasive ventilatory support to maintain O2 saturations. Incidentally he admits to using cocaine for the first time 2 days prior. Because of the degree of respiratory failure requiring CPAP patient was admitted to the step down unit for further monitoring and treatment.  Hospital Course:  Acute respiratory failure with hypoxia 2/2:   a) ?CAP (community acquired pneumonia)- multifocal changes on CT  *Cough has improved  Received 5 days of iv antibiotics and will be discharged on a week of oral antibiotics to complete the course.  Continue with nebs as needed at home. He also has some exp wheezing,. He is tobacco smoker and appears to have a component of COPD .  Will discharge him on medrol dose pack.  Recommend checking  A repeat CT chest in 6 to 8 months for resolution of the abnormal findings.   Acute and chronic respiratory failure with hypercapnia  a) OSA on CPAP  b) Obesity  *2011 polysomnogram revealed severe sleep apnea with recommendations at that time for BiPAP. *2-D echocardiogram this admission demonstrates normal systolic function and moderate LVH but more importantly right ventricle was moderately dilated right atrium was mildly dilated consistent with sequelae of sleep apnea. Recommend following up with pulmonology as outpatient with referral from PCP.  Polycythemia secondary to hypoxia  *Hemoglobin has decreased somewhat after hydration-baseline hemoglobin unknown at this time  RECOMMEND adequate hydration.  Fingertip numbness  *No true focal neurological deficits on exam  *Old laboratory data reviewed and previous B12 level low normal   Leukocytosis  Resolved.  Hypertension  *Blood pressure not well controlled including diastolic blood pressure  *Will discontinue Catapres patch that was started this admission and resume home metoprolol  Increased the dose of metoprolol and lisinopril. Resume home dose of HCTZ.  Tobacco abuse/Substance abuse  *Cessation counseling   Procedures:  Ct chest with contrast  Consultations:  none  Discharge Exam: Filed Vitals:   10/01/11 2146 10/02/11 0157 10/02/11 0621 10/02/11 1040  BP: 173/96 157/98 156/99 152/90  Pulse: 90 104 101 105  Temp: 98.7 F (37.1 C) 98.2 F (36.8 C) 97.7 F (36.5 C) 97.9 F (36.6 C)  TempSrc: Oral     Resp: 18 19 19 18   Height:      Weight:   117.799 kg (259 lb 11.2 oz)   SpO2: 95%  98% 100% 91%    General: NOT IN DISTRESS, all dressed up and ready to go home. Lungs: Clear to auscultation bilaterally with crackles, scattered esp wheezing present.   Cardiovascular: Regular rate and rhythm without murmur gallop or rub normal S1 and S2, no peripheral edema   Abdomen: Nontender, nondistended, soft, bowel sounds positive, no rebound, no ascites, no appreciable mass  Musculoskeletal: No significant cyanosis, clubbing of extremities  Neurological: Patient is alert and oriented x3, no focal neurological deficits appreciated *  Discharge Instructions  Discharge Orders    Future Orders Please Complete By Expires   Diet - low sodium heart healthy      Discharge instructions      Comments:   Follow up with PCP as recommended.   Activity as tolerated - No restrictions        Medication List  As of 10/02/2011  2:16 PM   STOP taking these medications         lisinopril-hydrochlorothiazide 20-12.5 MG per tablet         TAKE these medications         albuterol (5 MG/ML) 0.5% nebulizer solution   Commonly known as: PROVENTIL   Take 0.5 mLs (2.5 mg total) by nebulization every 2 (two) hours as needed for wheezing.      amitriptyline 25 MG tablet   Commonly known as: ELAVIL   Take 25 mg by mouth at bedtime.      bisacodyl 5 MG EC tablet   Commonly known as: DULCOLAX   Take 1 tablet (5 mg total) by mouth daily as needed.      guaiFENesin 600 MG 12 hr tablet   Commonly known as: MUCINEX   Take 2 tablets (1,200 mg total) by mouth 2 (two) times daily.      hydrochlorothiazide 12.5 MG capsule   Commonly known as: MICROZIDE   Take 1 capsule (12.5 mg total) by mouth daily.      ipratropium 0.02 % nebulizer solution   Commonly known as: ATROVENT   Take 2.5 mLs (0.5 mg total) by nebulization every 6 (six) hours.      lisinopril 40 MG tablet   Commonly known as: PRINIVIL,ZESTRIL   Take 1 tablet (40 mg total) by mouth daily.      methylPREDNISolone 4 MG tablet   Commonly known as: MEDROL DOSEPAK   follow package directions      metoprolol succinate 50 MG 24 hr tablet   Commonly known as: TOPROL-XL   Take 1 tablet (50 mg total) by mouth daily. Take with or immediately following a meal.      moxifloxacin 400 MG tablet   Commonly known as:  AVELOX   Take 1 tablet (400 mg total) by mouth daily.              The results of significant diagnostics from this hospitalization (including imaging, microbiology, ancillary and laboratory) are listed below for reference.    Significant Diagnostic Studies: Dg Chest 2 View  09/27/2011  *RADIOLOGY REPORT*  Clinical Data: Respiratory distress.  High blood pressure.  Nausea and vomiting.  Weakness.  CHEST - 2 VIEW  Comparison: 04/14/2010.  Findings: Cardiopericardial silhouette upper limits of normal for projection.  No airspace disease.  No effusion.  Mediastinal contours appear within normal limits.  IMPRESSION: Borderline heart size.  No acute cardiopulmonary disease.   Original Report Authenticated By: Andreas Newport, M.D.    Ct Angio Chest Pe W/cm &/or Wo Cm  09/27/2011  *RADIOLOGY  REPORT*  Clinical Data: Cough, short of breath, concern pulmonary embolism  CT ANGIOGRAPHY CHEST  Technique:  Multidetector CT imaging of the chest using the standard protocol during bolus administration of intravenous contrast. Multiplanar reconstructed images including MIPs were obtained and reviewed to evaluate the vascular anatomy.  Contrast: OMNIPAQUE IOHEXOL 350 MG/ML SOLN  Comparison: None  Findings: There are no filling defects with the pulmonary arteries to suggest acute pulmonary embolism.  No acute findings of the aorta or great vessels.  No pericardial fluid.  Esophagus is normal.  No mediastinal lymphadenopathy.  Small amount residual thymus in the anterior mediastinum remains.  There is a branching nodular pattern in the right upper lobe (image 33).  There is a branching nodular pattern in the right lower lobe (image 60).  Similar findings the left lower lobe and lingula. Some of the largest nodules are in the medial lingula (image 46).  Limited view of the upper abdomen is unremarkable. There is mild esophageal wall thickening distally.  Review of the skeleton is unremarkable.  IMPRESSION:  1.   Branching nodular pattern with the left and right upper and lower lobes most consistent with a multifocal infectious process. 2.  Recommend follow-up noncontrast thoracic CT after appropriate therapy to ensure resolution (6 to 8 weeks).  3.  No evidence of acute pulmonary embolism. 4.  Diffuse esophageal wall thickening could represent esophagitis.   Original Report Authenticated By: Genevive Bi, M.D.     Microbiology: Recent Results (from the past 240 hour(s))  CULTURE, BLOOD (ROUTINE X 2)     Status: Normal (Preliminary result)   Collection Time   09/27/11  8:27 PM      Component Value Range Status Comment   Specimen Description BLOOD HAND LEFT   Final    Special Requests BOTTLES DRAWN AEROBIC ONLY 7CC   Final    Culture  Setup Time 09/28/2011 01:26   Final    Culture     Final    Value:        BLOOD CULTURE RECEIVED NO GROWTH TO DATE CULTURE WILL BE HELD FOR 5 DAYS BEFORE ISSUING A FINAL NEGATIVE REPORT   Report Status PENDING   Incomplete   CULTURE, BLOOD (ROUTINE X 2)     Status: Normal (Preliminary result)   Collection Time   09/27/11  8:52 PM      Component Value Range Status Comment   Specimen Description BLOOD LEFT HAND   Final    Special Requests BOTTLES DRAWN AEROBIC ONLY 5CC   Final    Culture  Setup Time 09/28/2011 01:25   Final    Culture     Final    Value:        BLOOD CULTURE RECEIVED NO GROWTH TO DATE CULTURE WILL BE HELD FOR 5 DAYS BEFORE ISSUING A FINAL NEGATIVE REPORT   Report Status PENDING   Incomplete   MRSA PCR SCREENING     Status: Normal   Collection Time   09/28/11 12:20 AM      Component Value Range Status Comment   MRSA by PCR NEGATIVE  NEGATIVE Final   CULTURE, EXPECTORATED SPUTUM-ASSESSMENT     Status: Normal   Collection Time   09/29/11 10:43 AM      Component Value Range Status Comment   Specimen Description SPUTUM   Final    Special Requests NONE   Final    Sputum evaluation     Final    Value: MICROSCOPIC FINDINGS SUGGEST THAT THIS  SPECIMEN IS NOT  REPRESENTATIVE OF LOWER RESPIRATORY SECRETIONS. PLEASE RECOLLECT.     CALLED TO B MCMILLAN ON 2500 09/29/11 1125 BY K SCHULTZ   Report Status 09/29/2011 FINAL   Final      Labs: Basic Metabolic Panel:  Lab 10/01/11 8657 09/28/11 0350 09/27/11 1646 09/27/11 1355  NA 139 140 -- 138  K 3.8 5.0 4.8 4.9  CL 95* 99 -- 98  CO2 39* 34* -- 33*  GLUCOSE 86 103* -- 107*  BUN 9 9 -- 11  CREATININE 0.88 0.95 -- 1.21  CALCIUM 9.5 9.0 -- 9.3  MG -- -- 1.7 --  PHOS -- -- -- --   Liver Function Tests: No results found for this basename: AST:5,ALT:5,ALKPHOS:5,BILITOT:5,PROT:5,ALBUMIN:5 in the last 168 hours No results found for this basename: LIPASE:5,AMYLASE:5 in the last 168 hours No results found for this basename: AMMONIA:5 in the last 168 hours CBC:  Lab 10/01/11 0500 09/29/11 0420 09/28/11 0720 09/27/11 1355  WBC 9.4 10.2 12.0* 12.8*  NEUTROABS -- -- -- 8.7*  HGB 17.5* 17.7* 18.6* 19.8*  HCT 54.2* 56.6* 57.7* 60.0*  MCV 97.3 101.3* 98.0 95.5  PLT 183 159 171 166   Cardiac Enzymes:  Lab 09/27/11 1356  CKTOTAL --  CKMB --  CKMBINDEX --  TROPONINI <0.30   BNP: BNP (last 3 results)  Basename 09/28/11 0350  PROBNP 751.5*   CBG:  Lab 09/27/11 2355  GLUCAP 183*    Time coordinating discharge: 68 minutes  Signed:  Kamryn Gauthier  [facility] 10/02/2011, 2:16 PM

## 2011-10-02 NOTE — Progress Notes (Signed)
Assessment unchanged.  Scripts were given per MD order.  All questions pertaining to D/C we answered.  Pt was D/C'd on foot per pt request and accompanied by NT  Laikynn Pollio L .

## 2011-10-02 NOTE — Progress Notes (Signed)
Referral reviewed and spoke to Unit RN, pt has Bipap from home. No CM needs identified. Isidoro Donning RN CCM Case Mgmt phone [phone]

## 2011-10-04 LAB — CULTURE, BLOOD (ROUTINE X 2)
Culture: NO GROWTH
Culture: NO GROWTH

## 2011-10-11 LAB — MISCELLANEOUS TEST

## 2013-10-31 ENCOUNTER — Encounter (HOSPITAL_COMMUNITY): Payer: Self-pay | Admitting: Emergency Medicine

## 2013-10-31 ENCOUNTER — Emergency Department (HOSPITAL_COMMUNITY): Payer: Medicaid Other

## 2013-10-31 ENCOUNTER — Inpatient Hospital Stay (HOSPITAL_COMMUNITY)
Admission: EM | Admit: 2013-10-31 | Discharge: 2013-11-05 | DRG: 292 | Disposition: A | Discharge status: Home / Self Care | Payer: Medicaid Other | Attending: Internal Medicine | Admitting: Internal Medicine

## 2013-10-31 ENCOUNTER — Inpatient Hospital Stay (HOSPITAL_COMMUNITY): Payer: Medicaid Other

## 2013-10-31 DIAGNOSIS — I272 Other secondary pulmonary hypertension: Secondary | ICD-10-CM | POA: Diagnosis present

## 2013-10-31 DIAGNOSIS — E875 Hyperkalemia: Secondary | ICD-10-CM | POA: Diagnosis present

## 2013-10-31 DIAGNOSIS — F17210 Nicotine dependence, cigarettes, uncomplicated: Secondary | ICD-10-CM | POA: Diagnosis present

## 2013-10-31 DIAGNOSIS — Z6841 Body Mass Index (BMI) 40.0 and over, adult: Secondary | ICD-10-CM

## 2013-10-31 DIAGNOSIS — D72829 Elevated white blood cell count, unspecified: Secondary | ICD-10-CM

## 2013-10-31 DIAGNOSIS — Z9981 Dependence on supplemental oxygen: Secondary | ICD-10-CM

## 2013-10-31 DIAGNOSIS — Z8249 Family history of ischemic heart disease and other diseases of the circulatory system: Secondary | ICD-10-CM | POA: Diagnosis not present

## 2013-10-31 DIAGNOSIS — D751 Secondary polycythemia: Secondary | ICD-10-CM | POA: Diagnosis present

## 2013-10-31 DIAGNOSIS — E662 Morbid (severe) obesity with alveolar hypoventilation: Secondary | ICD-10-CM | POA: Diagnosis present

## 2013-10-31 DIAGNOSIS — I5031 Acute diastolic (congestive) heart failure: Principal | ICD-10-CM | POA: Diagnosis present

## 2013-10-31 DIAGNOSIS — E669 Obesity, unspecified: Secondary | ICD-10-CM

## 2013-10-31 DIAGNOSIS — J9611 Chronic respiratory failure with hypoxia: Secondary | ICD-10-CM | POA: Diagnosis present

## 2013-10-31 DIAGNOSIS — Z9114 Patient's other noncompliance with medication regimen: Secondary | ICD-10-CM | POA: Diagnosis present

## 2013-10-31 DIAGNOSIS — J449 Chronic obstructive pulmonary disease, unspecified: Secondary | ICD-10-CM | POA: Diagnosis present

## 2013-10-31 DIAGNOSIS — N189 Chronic kidney disease, unspecified: Secondary | ICD-10-CM | POA: Diagnosis present

## 2013-10-31 DIAGNOSIS — R609 Edema, unspecified: Secondary | ICD-10-CM | POA: Diagnosis present

## 2013-10-31 DIAGNOSIS — Z720 Tobacco use: Secondary | ICD-10-CM

## 2013-10-31 DIAGNOSIS — M1990 Unspecified osteoarthritis, unspecified site: Secondary | ICD-10-CM | POA: Diagnosis present

## 2013-10-31 DIAGNOSIS — I129 Hypertensive chronic kidney disease with stage 1 through stage 4 chronic kidney disease, or unspecified chronic kidney disease: Secondary | ICD-10-CM | POA: Diagnosis present

## 2013-10-31 DIAGNOSIS — E8809 Other disorders of plasma-protein metabolism, not elsewhere classified: Secondary | ICD-10-CM | POA: Diagnosis present

## 2013-10-31 DIAGNOSIS — Z79899 Other long term (current) drug therapy: Secondary | ICD-10-CM

## 2013-10-31 DIAGNOSIS — R601 Generalized edema: Secondary | ICD-10-CM

## 2013-10-31 DIAGNOSIS — F1910 Other psychoactive substance abuse, uncomplicated: Secondary | ICD-10-CM

## 2013-10-31 DIAGNOSIS — J9601 Acute respiratory failure with hypoxia: Secondary | ICD-10-CM

## 2013-10-31 DIAGNOSIS — Z9119 Patient's noncompliance with other medical treatment and regimen: Secondary | ICD-10-CM | POA: Diagnosis present

## 2013-10-31 DIAGNOSIS — N179 Acute kidney failure, unspecified: Secondary | ICD-10-CM

## 2013-10-31 DIAGNOSIS — F1410 Cocaine abuse, uncomplicated: Secondary | ICD-10-CM | POA: Diagnosis present

## 2013-10-31 DIAGNOSIS — R809 Proteinuria, unspecified: Secondary | ICD-10-CM | POA: Diagnosis present

## 2013-10-31 DIAGNOSIS — Z9989 Dependence on other enabling machines and devices: Secondary | ICD-10-CM

## 2013-10-31 DIAGNOSIS — I2781 Cor pulmonale (chronic): Secondary | ICD-10-CM | POA: Diagnosis present

## 2013-10-31 DIAGNOSIS — I10 Essential (primary) hypertension: Secondary | ICD-10-CM

## 2013-10-31 DIAGNOSIS — E780 Pure hypercholesterolemia: Secondary | ICD-10-CM | POA: Diagnosis present

## 2013-10-31 DIAGNOSIS — J9622 Acute and chronic respiratory failure with hypercapnia: Secondary | ICD-10-CM

## 2013-10-31 DIAGNOSIS — G4733 Obstructive sleep apnea (adult) (pediatric): Secondary | ICD-10-CM | POA: Diagnosis present

## 2013-10-31 HISTORY — DX: Heart failure, unspecified: I50.9

## 2013-10-31 HISTORY — DX: Pneumonia, unspecified organism: J18.9

## 2013-10-31 HISTORY — DX: Unspecified chronic bronchitis: J42

## 2013-10-31 HISTORY — DX: Pure hypercholesterolemia, unspecified: E78.00

## 2013-10-31 LAB — CBC WITH DIFFERENTIAL/PLATELET
BASOS ABS: 0 10*3/uL (ref 0.0–0.1)
BASOS PCT: 0 % (ref 0–1)
EOS PCT: 2 % (ref 0–5)
Eosinophils Absolute: 0.2 10*3/uL (ref 0.0–0.7)
HCT: 61.2 % — ABNORMAL HIGH (ref 39.0–52.0)
Hemoglobin: 18.3 g/dL — ABNORMAL HIGH (ref 13.0–17.0)
Lymphocytes Relative: 21 % (ref 12–46)
Lymphs Abs: 2.5 10*3/uL (ref 0.7–4.0)
MCH: 28.5 pg (ref 26.0–34.0)
MCHC: 29.9 g/dL — ABNORMAL LOW (ref 30.0–36.0)
MCV: 95.5 fL (ref 78.0–100.0)
Monocytes Absolute: 1.3 10*3/uL — ABNORMAL HIGH (ref 0.1–1.0)
Monocytes Relative: 11 % (ref 3–12)
NEUTROS ABS: 7.7 10*3/uL (ref 1.7–7.7)
Neutrophils Relative %: 65 % (ref 43–77)
PLATELETS: 185 10*3/uL (ref 150–400)
RBC: 6.41 MIL/uL — ABNORMAL HIGH (ref 4.22–5.81)
RDW: 15.3 % (ref 11.5–15.5)
WBC: 11.9 10*3/uL — AB (ref 4.0–10.5)

## 2013-10-31 LAB — URINALYSIS, ROUTINE W REFLEX MICROSCOPIC
Bilirubin Urine: NEGATIVE
Glucose, UA: NEGATIVE mg/dL
HGB URINE DIPSTICK: NEGATIVE
Ketones, ur: NEGATIVE mg/dL
LEUKOCYTES UA: NEGATIVE
NITRITE: NEGATIVE
PROTEIN: 30 mg/dL — AB
SPECIFIC GRAVITY, URINE: 1.005 (ref 1.005–1.030)
Urobilinogen, UA: 0.2 mg/dL (ref 0.0–1.0)
pH: 6.5 (ref 5.0–8.0)

## 2013-10-31 LAB — BASIC METABOLIC PANEL
Anion gap: 6 (ref 5–15)
BUN: 17 mg/dL (ref 6–23)
CALCIUM: 8.6 mg/dL (ref 8.4–10.5)
CO2: 43 mEq/L (ref 19–32)
Chloride: 92 mEq/L — ABNORMAL LOW (ref 96–112)
Creatinine, Ser: 1.34 mg/dL (ref 0.50–1.35)
GFR, EST AFRICAN AMERICAN: 81 mL/min — AB (ref >90)
GFR, EST NON AFRICAN AMERICAN: 70 mL/min — AB (ref >90)
Glucose, Bld: 122 mg/dL — ABNORMAL HIGH (ref 70–99)
POTASSIUM: 4.7 meq/L (ref 3.7–5.3)
SODIUM: 141 meq/L (ref 137–147)

## 2013-10-31 LAB — COMPREHENSIVE METABOLIC PANEL
ALBUMIN: 2.4 g/dL — AB (ref 3.5–5.2)
ALT: 31 U/L (ref 0–53)
AST: 42 U/L — AB (ref 0–37)
Alkaline Phosphatase: 46 U/L (ref 39–117)
Anion gap: 8 (ref 5–15)
BUN: 19 mg/dL (ref 6–23)
CALCIUM: 8.6 mg/dL (ref 8.4–10.5)
CO2: 35 meq/L — AB (ref 19–32)
Chloride: 94 mEq/L — ABNORMAL LOW (ref 96–112)
Creatinine, Ser: 1.46 mg/dL — ABNORMAL HIGH (ref 0.50–1.35)
GFR calc Af Amer: 73 mL/min — ABNORMAL LOW (ref >90)
GFR, EST NON AFRICAN AMERICAN: 63 mL/min — AB (ref >90)
Glucose, Bld: 115 mg/dL — ABNORMAL HIGH (ref 70–99)
Potassium: 5.4 mEq/L — ABNORMAL HIGH (ref 3.7–5.3)
Sodium: 137 mEq/L (ref 137–147)
Total Bilirubin: 0.7 mg/dL (ref 0.3–1.2)
Total Protein: 5.5 g/dL — ABNORMAL LOW (ref 6.0–8.3)

## 2013-10-31 LAB — URINE MICROSCOPIC-ADD ON

## 2013-10-31 LAB — CREATININE, URINE, RANDOM: Creatinine, Urine: 61.02 mg/dL

## 2013-10-31 LAB — PRO B NATRIURETIC PEPTIDE: PRO B NATRI PEPTIDE: 2527 pg/mL — AB (ref 0–125)

## 2013-10-31 LAB — PROTEIN, URINE, RANDOM: Total Protein, Urine: 148.2 mg/dL

## 2013-10-31 LAB — I-STAT TROPONIN, ED: TROPONIN I, POC: 0.03 ng/mL (ref 0.00–0.08)

## 2013-10-31 MED ORDER — PNEUMOCOCCAL VAC POLYVALENT 25 MCG/0.5ML IJ INJ
0.5000 mL | INJECTION | INTRAMUSCULAR | Status: AC
  Administered 2013-11-01: 0.5 mL via INTRAMUSCULAR
  Filled 2013-10-31: qty 0.5

## 2013-10-31 MED ORDER — ENOXAPARIN SODIUM 40 MG/0.4ML ~~LOC~~ SOLN
40.0000 mg | SUBCUTANEOUS | Status: DC
  Administered 2013-10-31 – 2013-11-04 (×5): 40 mg via SUBCUTANEOUS
  Filled 2013-10-31 (×6): qty 0.4

## 2013-10-31 MED ORDER — METOPROLOL SUCCINATE ER 50 MG PO TB24: 50.0000 mg | ORAL_TABLET | Freq: Every day | ORAL | Status: DC

## 2013-10-31 MED ORDER — FUROSEMIDE 10 MG/ML IJ SOLN
60.0000 mg | Freq: Two times a day (BID) | INTRAMUSCULAR | Status: DC
Start: 2013-10-31 — End: 2013-11-01
  Administered 2013-10-31: 60 mg via INTRAVENOUS
  Filled 2013-10-31 (×3): qty 6

## 2013-10-31 MED ORDER — ONDANSETRON HCL 4 MG PO TABS: 4.0000 mg | ORAL_TABLET | Freq: Four times a day (QID) | ORAL | Status: DC | PRN

## 2013-10-31 MED ORDER — FUROSEMIDE 10 MG/ML IJ SOLN
60.0000 mg | Freq: Once | INTRAMUSCULAR | Status: AC
Start: 2013-10-31 — End: 2013-10-31
  Administered 2013-10-31: 60 mg via INTRAVENOUS
  Filled 2013-10-31: qty 6

## 2013-10-31 MED ORDER — ACETAMINOPHEN 650 MG RE SUPP: 650.0000 mg | Freq: Four times a day (QID) | RECTAL | Status: DC | PRN

## 2013-10-31 MED ORDER — ONDANSETRON HCL 4 MG/2ML IJ SOLN: 4.0000 mg | Freq: Four times a day (QID) | INTRAMUSCULAR | Status: DC | PRN

## 2013-10-31 MED ORDER — INFLUENZA VAC SPLIT QUAD 0.5 ML IM SUSY
0.5000 mL | PREFILLED_SYRINGE | INTRAMUSCULAR | Status: AC
  Administered 2013-11-01: 0.5 mL via INTRAMUSCULAR
  Filled 2013-10-31: qty 0.5

## 2013-10-31 MED ORDER — ALBUTEROL SULFATE (2.5 MG/3ML) 0.083% IN NEBU
5.0000 mg | INHALATION_SOLUTION | Freq: Once | RESPIRATORY_TRACT | Status: AC
  Administered 2013-10-31: 5 mg via RESPIRATORY_TRACT
  Filled 2013-10-31: qty 6

## 2013-10-31 MED ORDER — ACETAMINOPHEN 325 MG PO TABS: 650.0000 mg | ORAL_TABLET | Freq: Four times a day (QID) | ORAL | Status: DC | PRN

## 2013-10-31 MED ORDER — ALBUTEROL SULFATE (2.5 MG/3ML) 0.083% IN NEBU: 2.5000 mg | INHALATION_SOLUTION | Freq: Four times a day (QID) | RESPIRATORY_TRACT | Status: DC | PRN

## 2013-10-31 NOTE — ED Notes (Signed)
Respiratory at bedside.

## 2013-10-31 NOTE — ED Provider Notes (Signed)
CSN: [id number]     Arrival date & time 10/31/13  1112 History   First MD Initiated Contact with Patient 10/31/13 1124     Chief Complaint  Patient presents with  . Abdominal Pain  . abdominal swelling   . Eye Problem     (Consider location/radiation/quality/duration/timing/severity/associated sxs/prior Treatment) HPI Douglas [name] is a 28 y.o. morbidly obese male with history of obstructive sleep apnea, hypertension, COPD, who presents to emergency department with complaint of swelling. States for the last month he has been waking up every morning with swelling of his legs, abdomen, face. He states that approximately 3 weeks ago he went to his primary care Dr. because he had flulike symptoms, he was placed on antibiotic and was given a "fluid pill". Patient states that he only took it for about a week which all but his Dr. given him. States swelling did not improve much. States it's beginning to get worse. He does report some shortness of breath on exertion, denies any chest pain. She denies any pain in his abdomen. No pain in his legs. He states everything just feels tight. Patient has severe obstructive sleep apnea, wear CPAP daily. History of COPD, hypertension, tobacco abuse.   Past Medical History  Diagnosis Date  . Arthritis   . Bronchitis   . Hypertension   . Obesity   . OSA on CPAP   . Tobacco abuse   . Family history of asthma   . Substance abuse 8/31/213    cociane "first time use"   History reviewed. No pertinent past surgical history. No family history on file. History  Substance Use Topics  . Smoking status: Current Every Day Smoker -- 0.30 packs/day for 7 years    Types: Cigarettes  . Smokeless tobacco: Not on file  . Alcohol Use: 7.0 oz/week    14 drink(s) per week     Comment: rarely    Review of Systems  Constitutional: Negative for fever and chills.  Respiratory: Positive for shortness of breath. Negative for cough and chest tightness.    Cardiovascular: Positive for leg swelling. Negative for chest pain and palpitations.  Gastrointestinal: Negative for nausea, vomiting, abdominal pain, diarrhea and abdominal distention.  Genitourinary: Negative for dysuria, urgency, frequency and hematuria.  Musculoskeletal: Negative for arthralgias, myalgias, neck pain and neck stiffness.  Skin: Negative for rash.  Allergic/Immunologic: Negative for immunocompromised state.  Neurological: Negative for dizziness, weakness, light-headedness, numbness and headaches.  All other systems reviewed and are negative.     Allergies  Review of patient's allergies indicates no known allergies.  Home Medications   Prior to Admission medications   Medication Sig Start Date End Date Taking? Authorizing Provider  albuterol (PROVENTIL HFA;VENTOLIN HFA) 108 (90 BASE) MCG/ACT inhaler Inhale into the lungs every 6 (six) hours as needed for wheezing or shortness of breath.   Yes Historical Provider, MD  lisinopril-hydrochlorothiazide (PRINZIDE,ZESTORETIC) 20-12.5 MG per tablet Take 1 tablet by mouth daily.   Yes Historical Provider, MD  albuterol (PROVENTIL) (5 MG/ML) 0.5% nebulizer solution Take 0.5 mLs (2.5 mg total) by nebulization every 2 (two) hours as needed for wheezing. 10/02/11 10/01/12  Hosie Poisson, MD  hydrochlorothiazide (MICROZIDE) 12.5 MG capsule Take 1 capsule (12.5 mg total) by mouth daily. 10/02/11 10/01/12  Hosie Poisson, MD  ipratropium (ATROVENT) 0.02 % nebulizer solution Take 2.5 mLs (0.5 mg total) by nebulization every 6 (six) hours. 10/02/11 10/01/12  Hosie Poisson, MD  lisinopril (PRINIVIL,ZESTRIL) 40 MG tablet Take 1 tablet (40 mg total)  by mouth daily. 10/02/11 10/01/12  Hosie Poisson, MD  metoprolol succinate (TOPROL-XL) 50 MG 24 hr tablet Take 1 tablet (50 mg total) by mouth daily. Take with or immediately following a meal. 10/02/11 10/01/12  Hosie Poisson, MD   BP 143/93  Pulse 96  Temp(Src) 98 F (36.7 C) (Oral)  Resp 20  Ht 5\' 5"  (1.651 m)  Wt  270 lb (122.471 kg)  BMI 44.93 kg/m2  SpO2 97% Physical Exam  Nursing note and vitals reviewed. Constitutional: He appears well-developed and well-nourished. No distress.  HENT:  Head: Normocephalic and atraumatic.  Eyes: Conjunctivae are normal.  Neck: Neck supple.  Cardiovascular: Normal rate, regular rhythm and normal heart sounds.   Pulmonary/Chest: Effort normal. No respiratory distress. He has wheezes. He has no rales.  Expiratory wheezes bilaterally  Abdominal: Soft. Bowel sounds are normal. He exhibits no distension. There is no tenderness. There is no rebound.  Pitting edema up to the umbilicus  Musculoskeletal: He exhibits no edema.  3+ pitting edema of lower extremities bilaterally  Neurological: He is alert.  Skin: Skin is warm and dry.    ED Course  Procedures (including critical care time) Labs Review Labs Reviewed  CBC WITH DIFFERENTIAL - Abnormal; Notable for the following:    WBC 11.9 (*)    RBC 6.41 (*)    Hemoglobin 18.3 (*)    HCT 61.2 (*)    MCHC 29.9 (*)    Monocytes Absolute 1.3 (*)    All other components within normal limits  COMPREHENSIVE METABOLIC PANEL - Abnormal; Notable for the following:    Potassium 5.4 (*)    Chloride 94 (*)    CO2 35 (*)    Glucose, Bld 115 (*)    Creatinine, Ser 1.46 (*)    Total Protein 5.5 (*)    Albumin 2.4 (*)    AST 42 (*)    GFR calc non Af Amer 63 (*)    GFR calc Af Amer 73 (*)    All other components within normal limits  PRO B NATRIURETIC PEPTIDE - Abnormal; Notable for the following:    Pro B Natriuretic peptide (BNP) 2527.0 (*)    All other components within normal limits  I-STAT TROPOININ, ED    Imaging Review Dg Chest 2 View  10/31/2013   CLINICAL DATA:  Acute chest pain.  EXAM: CHEST  2 VIEW  COMPARISON:  September 27, 2011.  FINDINGS: The heart size and mediastinal contours are within normal limits. Both lungs are clear. No pneumothorax or pleural effusion is noted. The visualized skeletal  structures are unremarkable.  IMPRESSION: No acute cardiopulmonary abnormality seen.   Electronically Signed   By: Sabino Dick M.D.   On: 10/31/2013 13:27     EKG Interpretation None      MDM   Final diagnoses:  Anasarca    Patient with swelling lower extremities, abdomen, face. Echo in 2013 showed normal left ventricular function, EF of 50-55%, moderately dilated right ventricle, moderate LVH. Will get labs, chest x-ray, Lasix IV ordered. Patient was noted to be hypoxic in the room, with oxygen saturation in the low 80s. Patient states he is supposed to be on home oxygen, but unable to tell me, should states he has not been wearing it daily. Is a very poor historian  1:55 PM Patient received 60 mg of Lasix IV. He has now had an output of 1100 mL of urine. Also check patient's weight is up 5 kg in the last month.  He is feeling better. Chest x-ray negative. Patient did have some wheezing, neb treatment ordered. Oxygen saturation is now up to low 90s on 2 L. Will admit for further diuresis.   Filed Vitals:   10/31/13 1230 10/31/13 1245 10/31/13 1300 10/31/13 1325  BP: 144/99 137/91 124/95 137/95  Pulse: 94 92 88 91  Temp:      TempSrc:      Resp: 26 16 15 15   Height:      Weight:      SpO2: 92% 95% 95% 90%      Renold Genta, PA-C 10/31/13 1632

## 2013-10-31 NOTE — ED Notes (Signed)
Hospitalist at bedside 

## 2013-10-31 NOTE — Progress Notes (Signed)
Notified Baltazar Najjar, NP that pt having abdominal cramps when he lies down. Will continue to monitor pt. Ranelle Oyster, RN

## 2013-10-31 NOTE — ED Notes (Signed)
Patient states abdominal pain and swelling.  Unsure of date of onset.  Patient complains of bilateral eye swelling also.

## 2013-10-31 NOTE — ED Notes (Signed)
Tatyana at bedside

## 2013-10-31 NOTE — Progress Notes (Signed)
[name] [id number] Code Status: full Admission Data: 10/31/2013 4:23 PM Attending Provider: Verdell Carmine DGL:OVFIEPP,IRJJO A, MD Consults/ Treatment Team:    Douglas [name] is a 28 y.o. male patient admitted from ED awake, alert - oriented  X 3 - no acute distress noted.  VSS - Blood pressure 158/97, pulse 88, temperature 98 F (36.7 C), temperature source Oral, resp. rate 18, height 5\' 5"  (1.651 m), weight 133.947 kg (295 lb 4.8 oz), SpO2 88.00%.    IV in place, occlusive dsg intact without redness.  Orientation to room, and floor completed with information packet given to patient/family.  Patient declined safety video at this time.  Admission INP armband ID verified with patient/family, and in place.   SR up x 2, fall assessment complete, with patient and family able to verbalize understanding of risk associated with falls, and verbalized understanding to call nsg before up out of bed.  Call light within reach, patient able to voice, and demonstrate understanding.  Skin, clean-dry- intact without evidence of bruising, or skin tears.   No evidence of skin break down noted on exam.     Will cont to eval and treat per MD orders.  [name], RN 10/31/2013 4:23 PM

## 2013-10-31 NOTE — Progress Notes (Signed)
Pt states that the abdominal cramps are gone now. Gave pt heating packs to put on his abdomen. Will continue to monitor pt. Ranelle Oyster, RN

## 2013-10-31 NOTE — Progress Notes (Signed)
CRITICAL VALUE ALERT  Critical value received:  CO2 43  Date of notification:  10/31/13  Time of notification:  2116  Critical value read back:Yes.    Nurse who received alert:  Ranelle Oyster, RN  MD notified (1st page): Baltazar Najjar, NP   Time of first page: 2124  MD notified (2nd page): Baltazar Najjar, NP  Time of second page:2139.  Paged Baltazar Najjar, NP again at 2200.   Responding MD: Baltazar Najjar, NP  Time MD responded:  2233, no new orders given.

## 2013-10-31 NOTE — Progress Notes (Signed)
Report received from [place] ,[place] for admission to [phone]

## 2013-10-31 NOTE — ED Notes (Signed)
ATTEMPTED TO CALL REPORT

## 2013-10-31 NOTE — ED Notes (Signed)
Patient transported to X-ray 

## 2013-10-31 NOTE — H&P (Addendum)
[facility] History and Physical  Douglas [name] LFY:[id number] DOB: [birth year] DOA: 10/31/2013  Referring physician: EDP PCP: Philis Fendt, MD   Chief Complaint: swelling of abd/puffiness around eyes  HPI: Douglas [name] is a 28 y.o. male with PMH of HTN, Morbid obesity, Severe OSA, ho non compliance, former smoker presents to the ER with the above complaints. He is a very poor historian and reports long standing leg swelling, which he has noticed for weeks to months, mostly when he doesn't take his BP meds per patient report. In the last few days he also noticed swelling of his abdominal wall and puffiness around his eyes. He reports for the last month he has been compliant with his medications but has been unable to use his CPAP for 61month due to need for a water chamber. In addition, also has noticed some dyspnea on exertion for few weeks, he quit smoking 23month ago. In ER, noted to have AKi, mild hyperkalemia, hypoalbuminemia, elevated BNP, and polycythemia   Review of Systems: positives bolded Constitutional:  No weight loss, night sweats, Fevers, chills, fatigue.  HEENT:  No headaches, Difficulty swallowing,Tooth/dental problems,Sore throat,  No sneezing, itching, ear ache, nasal congestion, post nasal drip,  Cardio-vascular:  No chest pain, Orthopnea, PND, swelling in lower extremities, anasarca, dizziness, palpitations  GI:  No heartburn, indigestion, abdominal pain, nausea, vomiting, diarrhea, change in bowel habits, loss of appetite  Resp:  No shortness of breath with exertion or at rest. No excess mucus, no productive cough, No non-productive cough, No coughing up of blood.No change in color of mucus.No wheezing.No chest wall deformity  Skin:  no rash or lesions.  GU:  no dysuria, change in color of urine, no urgency or frequency. No flank pain.  Musculoskeletal:  No joint pain or swelling. No decreased range of motion. No back pain.  Psych:  No  change in mood or affect. No depression or anxiety. No memory loss.   Past Medical History  Diagnosis Date  . Arthritis   . Bronchitis   . Hypertension   . Obesity   . OSA on CPAP   . Tobacco abuse   . Family history of asthma   . Substance abuse 8/31/213    cociane "first time use"   History reviewed. No pertinent past surgical history. Social History:  reports that he has been smoking Cigarettes.  He has a 2.1 pack-year smoking history. He does not have any smokeless tobacco history on file. He reports that he drinks about 7 ounces of alcohol per week. He reports that he uses illicit drugs (Cocaine).  No Known Allergies  No family history on file.   Prior to Admission medications   Medication Sig Start Date End Date Taking? Authorizing Provider  albuterol (PROVENTIL HFA;VENTOLIN HFA) 108 (90 BASE) MCG/ACT inhaler Inhale into the lungs every 6 (six) hours as needed for wheezing or shortness of breath.   Yes Historical Provider, MD  lisinopril-hydrochlorothiazide (PRINZIDE,ZESTORETIC) 20-12.5 MG per tablet Take 1 tablet by mouth daily.   Yes Historical Provider, MD  albuterol (PROVENTIL) (5 MG/ML) 0.5% nebulizer solution Take 0.5 mLs (2.5 mg total) by nebulization every 2 (two) hours as needed for wheezing. 10/02/11 10/01/12  Hosie Poisson, MD  hydrochlorothiazide (MICROZIDE) 12.5 MG capsule Take 1 capsule (12.5 mg total) by mouth daily. 10/02/11 10/01/12  Hosie Poisson, MD  ipratropium (ATROVENT) 0.02 % nebulizer solution Take 2.5 mLs (0.5 mg total) by nebulization every 6 (six) hours. 10/02/11 10/01/12  Hosie Poisson, MD  lisinopril (  PRINIVIL,ZESTRIL) 40 MG tablet Take 1 tablet (40 mg total) by mouth daily. 10/02/11 10/01/12  Hosie Poisson, MD  metoprolol succinate (TOPROL-XL) 50 MG 24 hr tablet Take 1 tablet (50 mg total) by mouth daily. Take with or immediately following a meal. 10/02/11 10/01/12  Hosie Poisson, MD   Physical Exam: Filed Vitals:   10/31/13 1230 10/31/13 1245 10/31/13 1300 10/31/13 1325    BP: 144/99 137/91 124/95 137/95  Pulse: 94 92 88 91  Temp:      TempSrc:      Resp: 26 16 15 15   Height:      Weight:      SpO2: 92% 95% 95% 90%    Wt Readings from Last 3 Encounters:  10/31/13 122.471 kg (270 lb)  10/02/11 117.799 kg (259 lb 11.2 oz)    General:  Appears calm and comfortable Eyes: PERRL, normal lids, irises & conjunctiva ENT: grossly normal lips & tongue Neck: no LAD, masses or thyromegaly Cardiovascular: RRR, no m/r/g. No LE edema. Respiratory: CTA bilaterally, no w/r/r. Normal respiratory effort. Abdomen: soft, nt, nd, BS present, abd wall edema-pitting Skin: no rash or induration seen on limited exam Musculoskeletal: grossly normal tone BUE/BLE, 1-2plus edema Psychiatric: grossly normal mood and affect, speech fluent and appropriate Neurologic: grossly non-focal.          Labs on Admission:  Basic Metabolic Panel:  Recent Labs Lab 10/31/13 1200  NA 137  K 5.4*  CL 94*  CO2 35*  GLUCOSE 115*  BUN 19  CREATININE 1.46*  CALCIUM 8.6   Liver Function Tests:  Recent Labs Lab 10/31/13 1200  AST 42*  ALT 31  ALKPHOS 46  BILITOT 0.7  PROT 5.5*  ALBUMIN 2.4*   No results found for this basename: LIPASE, AMYLASE,  in the last 168 hours No results found for this basename: AMMONIA,  in the last 168 hours CBC:  Recent Labs Lab 10/31/13 1200  WBC 11.9*  NEUTROABS 7.7  HGB 18.3*  HCT 61.2*  MCV 95.5  PLT 185   Cardiac Enzymes: No results found for this basename: CKTOTAL, CKMB, CKMBINDEX, TROPONINI,  in the last 168 hours  BNP (last 3 results)  Recent Labs  10/31/13 1200  PROBNP 2527.0*   CBG: No results found for this basename: GLUCAP,  in the last 168 hours  Radiological Exams on Admission: Dg Chest 2 View  10/31/2013   CLINICAL DATA:  Acute chest pain.  EXAM: CHEST  2 VIEW  COMPARISON:  September 27, 2011.  FINDINGS: The heart size and mediastinal contours are within normal limits. Both lungs are clear. No pneumothorax or  pleural effusion is noted. The visualized skeletal structures are unremarkable.  IMPRESSION: No acute cardiopulmonary abnormality seen.   Electronically Signed   By: Sabino Dick M.D.   On: 10/31/2013 13:27    EKG: Independently reviewed. NSR, RVH  Assessment/Plan    1. Anasarca -suspect related to R heart failure-ECHO 2013 with Dilated RV, LVH and EF of 50%, repeat ECHO -check UA, also spot Urine Protein and creatinine to eval for nephrotic range proteinuria -check Renal US -IV lasix 60mg  q12 -monitor I/Os, weights  2. Hypertension -hold ACE and HCTZ due to AKI -will likely need ACE resumed   3. Severe OSA on CPAP -Needs help with getting his CPAP machine at home  4. Polycythemia secondary to chronic hypoxia  5. Morbid obesity -needs life style modification  6. Hypoalbuminemia -check SPEP, UPEP, Also check UA and r/o nephrotic range proteinuria  7. Mild hyperkalemia -expect this to correct with diuresis -bmet this afternoon  DVT proph: lovenox  Code Status: Full Code  Family Communication:none at bedside Disposition Plan: inpatient  Time spent: 39min  [facility] Pager [phone]

## 2013-11-01 ENCOUNTER — Encounter (HOSPITAL_COMMUNITY): Payer: Self-pay | Admitting: Cardiology

## 2013-11-01 ENCOUNTER — Inpatient Hospital Stay (HOSPITAL_COMMUNITY): Payer: Medicaid Other

## 2013-11-01 DIAGNOSIS — R601 Generalized edema: Secondary | ICD-10-CM

## 2013-11-01 DIAGNOSIS — I5031 Acute diastolic (congestive) heart failure: Principal | ICD-10-CM

## 2013-11-01 DIAGNOSIS — E8809 Other disorders of plasma-protein metabolism, not elsewhere classified: Secondary | ICD-10-CM

## 2013-11-01 DIAGNOSIS — D751 Secondary polycythemia: Secondary | ICD-10-CM

## 2013-11-01 DIAGNOSIS — R809 Proteinuria, unspecified: Secondary | ICD-10-CM

## 2013-11-01 DIAGNOSIS — I10 Essential (primary) hypertension: Secondary | ICD-10-CM

## 2013-11-01 DIAGNOSIS — I270 Primary pulmonary hypertension: Secondary | ICD-10-CM

## 2013-11-01 DIAGNOSIS — I379 Nonrheumatic pulmonary valve disorder, unspecified: Secondary | ICD-10-CM

## 2013-11-01 LAB — BASIC METABOLIC PANEL
Anion gap: 8 (ref 5–15)
BUN: 15 mg/dL (ref 6–23)
CHLORIDE: 92 meq/L — AB (ref 96–112)
CO2: 41 mEq/L (ref 19–32)
Calcium: 8.4 mg/dL (ref 8.4–10.5)
Creatinine, Ser: 1.3 mg/dL (ref 0.50–1.35)
GFR calc Af Amer: 84 mL/min — ABNORMAL LOW (ref >90)
GFR, EST NON AFRICAN AMERICAN: 72 mL/min — AB (ref >90)
GLUCOSE: 98 mg/dL (ref 70–99)
Potassium: 4.3 mEq/L (ref 3.7–5.3)
Sodium: 141 mEq/L (ref 137–147)

## 2013-11-01 LAB — RAPID URINE DRUG SCREEN, HOSP PERFORMED
AMPHETAMINES: NOT DETECTED
Barbiturates: NOT DETECTED
Benzodiazepines: NOT DETECTED
Cocaine: POSITIVE — AB
OPIATES: NOT DETECTED
TETRAHYDROCANNABINOL: NOT DETECTED

## 2013-11-01 MED ORDER — TECHNETIUM TO 99M ALBUMIN AGGREGATED
6.0000 | Freq: Once | INTRAVENOUS | Status: AC | PRN
Start: 2013-11-01 — End: 2013-11-01
  Administered 2013-11-01: 6 via INTRAVENOUS

## 2013-11-01 MED ORDER — TECHNETIUM TC 99M DIETHYLENETRIAME-PENTAACETIC ACID: 40.0000 | Freq: Once | INTRAVENOUS | Status: AC | PRN

## 2013-11-01 MED ORDER — METOPROLOL TARTRATE 12.5 MG HALF TABLET
12.5000 mg | ORAL_TABLET | Freq: Two times a day (BID) | ORAL | Status: DC
  Administered 2013-11-01 – 2013-11-03 (×6): 12.5 mg via ORAL
  Filled 2013-11-01 (×8): qty 1

## 2013-11-01 MED ORDER — FUROSEMIDE 10 MG/ML IJ SOLN
60.0000 mg | Freq: Two times a day (BID) | INTRAMUSCULAR | Status: DC
  Administered 2013-11-01 (×2): 60 mg via INTRAVENOUS
  Filled 2013-11-01 (×3): qty 6

## 2013-11-01 NOTE — Consult Note (Signed)
Primary cardiologist: new  HPI: 28 year old male with past medical history of pulmonary hypertension, obstructive sleep apnea, obesity and substance abuse for evaluation of congestive heart failure. Patient had not taken any of his blood pressure medications in approximately one year. He resumed these in August. He presented with complaints of increased dyspnea on exertion, pedal edema, increased abdominal girth and facial swelling. No chest pain, palpitations or syncope. He has not used his CPAP and continues to smoke. He used cocaine approximately one month ago. Echocardiogram shows normal LV function, severe left ventricular hypertrophy, right atrial/right ventricular enlargement and findings suggestive of cor pulmonale.  Medications Prior to Admission  Medication Sig Dispense Refill  . albuterol (PROVENTIL HFA;VENTOLIN HFA) 108 (90 BASE) MCG/ACT inhaler Inhale 1-2 puffs into the lungs every 6 (six) hours as needed for wheezing or shortness of breath.       . lisinopril-hydrochlorothiazide (PRINZIDE,ZESTORETIC) 20-12.5 MG per tablet Take 1 tablet by mouth daily.        No Known Allergies  Past Medical History  Diagnosis Date  . Hypertension   . Obesity   . Tobacco abuse   . Substance abuse 8/31/213    cociane "first time use"  . High cholesterol   . CHF (congestive heart failure)   . Chronic bronchitis   . OSA on CPAP   . Arthritis     "hands, feet" (10/31/2013)  . Pneumonia 09/2011    Past Surgical History  Procedure Laterality Date  . No past surgeries      History   Social History  . Marital Status: Single    Spouse Name: N/A    Number of Children: 2  . Years of Education: N/A   Occupational History  .     Social History Main Topics  . Smoking status: Current Every Day Smoker -- 0.50 packs/day for 9 years    Types: Cigarettes  . Smokeless tobacco: Never Used  . Alcohol Use: 0.6 oz/week    1 Cans of beer per week  . Drug Use: Yes    Special: Cocaine   Comment: 10/31/2013 "might use cocaine once/month"  . Sexual Activity: No   Other Topics Concern  . Not on file   Social History Narrative  . No narrative on file    Family History  Problem Relation Age of Onset  . Heart disease Mother     Pacemaker    ROS:  Cough productive of white sputum but no fevers or chills, hemoptysis, dysphasia, odynophagia, melena, hematochezia, dysuria, hematuria, rash, seizure activity, orthopnea, PND, pedal edema, claudication. Remaining systems are negative.  Physical Exam:   Blood pressure 155/98, pulse 89, temperature 98.2 F (36.8 C), temperature source Axillary, resp. rate 18, height '5\' 5"'  (1.651 m), weight 295 lb 6.4 oz (133.993 kg), SpO2 97.00%.  General:  Well developed/obese in NAD Skin warm/dry, tattoos Patient not depressed No peripheral clubbing Back-normal HEENT-normal/normal eyelids Neck supple/normal carotid upstroke bilaterally; no bruits; no JVD; no thyromegaly chest - CTA/ normal expansion CV - RRR/normal S1 and S2; no murmurs, rubs or gallops;  PMI nondisplaced Abdomen -NT/ND, no HSM, no mass, + bowel sounds, no bruit 2+ femoral pulses, no bruits Ext-1+ edema, no chords, 2+ DP Neuro-grossly nonfocal  ECG Sinus rhythm, right axis deviation, nonspecific ST changes  Results for orders placed during the hospital encounter of 10/31/13 (from the past 48 hour(s))  CBC WITH DIFFERENTIAL     Status: Abnormal   Collection Time    10/31/13 12:00 PM  Result Value Ref Range   WBC 11.9 (*) 4.0 - 10.5 K/uL   RBC 6.41 (*) 4.22 - 5.81 MIL/uL   Hemoglobin 18.3 (*) 13.0 - 17.0 g/dL   HCT 61.2 (*) 39.0 - 52.0 %   MCV 95.5  78.0 - 100.0 fL   MCH 28.5  26.0 - 34.0 pg   MCHC 29.9 (*) 30.0 - 36.0 g/dL   RDW 15.3  11.5 - 15.5 %   Platelets 185  150 - 400 K/uL   Neutrophils Relative % 65  43 - 77 %   Neutro Abs 7.7  1.7 - 7.7 K/uL   Lymphocytes Relative 21  12 - 46 %   Lymphs Abs 2.5  0.7 - 4.0 K/uL   Monocytes Relative 11  3 - 12 %     Monocytes Absolute 1.3 (*) 0.1 - 1.0 K/uL   Eosinophils Relative 2  0 - 5 %   Eosinophils Absolute 0.2  0.0 - 0.7 K/uL   Basophils Relative 0  0 - 1 %   Basophils Absolute 0.0  0.0 - 0.1 K/uL  COMPREHENSIVE METABOLIC PANEL     Status: Abnormal   Collection Time    10/31/13 12:00 PM      Result Value Ref Range   Sodium 137  137 - 147 mEq/L   Potassium 5.4 (*) 3.7 - 5.3 mEq/L   Chloride 94 (*) 96 - 112 mEq/L   CO2 35 (*) 19 - 32 mEq/L   Glucose, Bld 115 (*) 70 - 99 mg/dL   BUN 19  6 - 23 mg/dL   Creatinine, Ser 1.46 (*) 0.50 - 1.35 mg/dL   Calcium 8.6  8.4 - 10.5 mg/dL   Total Protein 5.5 (*) 6.0 - 8.3 g/dL   Albumin 2.4 (*) 3.5 - 5.2 g/dL   AST 42 (*) 0 - 37 U/L   Comment: HEMOLYSIS AT THIS LEVEL MAY AFFECT RESULT   ALT 31  0 - 53 U/L   Alkaline Phosphatase 46  39 - 117 U/L   Total Bilirubin 0.7  0.3 - 1.2 mg/dL   GFR calc non Af Amer 63 (*) >90 mL/min   GFR calc Af Amer 73 (*) >90 mL/min   Comment: (NOTE)     The eGFR has been calculated using the CKD EPI equation.     This calculation has not been validated in all clinical situations.     eGFR's persistently <90 mL/min signify possible Chronic Kidney     Disease.   Anion gap 8  5 - 15  PRO B NATRIURETIC PEPTIDE     Status: Abnormal   Collection Time    10/31/13 12:00 PM      Result Value Ref Range   Pro B Natriuretic peptide (BNP) 2527.0 (*) 0 - 125 pg/mL  I-STAT TROPOININ, ED     Status: None   Collection Time    10/31/13 12:12 PM      Result Value Ref Range   Troponin i, poc 0.03  0.00 - 0.08 ng/mL   Comment 3            Comment: Due to the release kinetics of cTnI,     a negative result within the first hours     of the onset of symptoms does not rule out     myocardial infarction with certainty.     If myocardial infarction is still suspected,     repeat the test at appropriate intervals.  URINALYSIS, ROUTINE W REFLEX  MICROSCOPIC     Status: Abnormal   Collection Time    10/31/13  2:26 PM      Result Value  Ref Range   Color, Urine YELLOW  YELLOW   APPearance CLEAR  CLEAR   Specific Gravity, Urine 1.005  1.005 - 1.030   pH 6.5  5.0 - 8.0   Glucose, UA NEGATIVE  NEGATIVE mg/dL   Hgb urine dipstick NEGATIVE  NEGATIVE   Bilirubin Urine NEGATIVE  NEGATIVE   Ketones, ur NEGATIVE  NEGATIVE mg/dL   Protein, ur 30 (*) NEGATIVE mg/dL   Urobilinogen, UA 0.2  0.0 - 1.0 mg/dL   Nitrite NEGATIVE  NEGATIVE   Leukocytes, UA NEGATIVE  NEGATIVE  URINE MICROSCOPIC-ADD ON     Status: None   Collection Time    10/31/13  2:26 PM      Result Value Ref Range   Squamous Epithelial / LPF RARE  RARE   WBC, UA 0-2  <3 WBC/hpf   RBC / HPF 0-2  <3 RBC/hpf   Bacteria, UA RARE  RARE  PROTEIN, URINE, RANDOM     Status: None   Collection Time    10/31/13  6:14 PM      Result Value Ref Range   Total Protein, Urine 148.2     Comment: NO NORMAL RANGE ESTABLISHED FOR THIS TEST  CREATININE, URINE, RANDOM     Status: None   Collection Time    10/31/13  6:14 PM      Result Value Ref Range   Creatinine, Urine 17.61    BASIC METABOLIC PANEL     Status: Abnormal   Collection Time    10/31/13  8:30 PM      Result Value Ref Range   Sodium 141  137 - 147 mEq/L   Potassium 4.7  3.7 - 5.3 mEq/L   Comment: HEMOLYSIS AT THIS LEVEL MAY AFFECT RESULT   Chloride 92 (*) 96 - 112 mEq/L   CO2 43 (*) 19 - 32 mEq/L   Comment: CRITICAL RESULT CALLED TO, READ BACK BY AND VERIFIED WITH:     J THATCHER,RN [id number] 10/31/13 WBOND   Glucose, Bld 122 (*) 70 - 99 mg/dL   BUN 17  6 - 23 mg/dL   Creatinine, Ser 1.34  0.50 - 1.35 mg/dL   Calcium 8.6  8.4 - 10.5 mg/dL   GFR calc non Af Amer 70 (*) >90 mL/min   GFR calc Af Amer 81 (*) >90 mL/min   Comment: (NOTE)     The eGFR has been calculated using the CKD EPI equation.     This calculation has not been validated in all clinical situations.     eGFR's persistently <90 mL/min signify possible Chronic Kidney     Disease.   Anion gap 6  5 - 15  BASIC METABOLIC PANEL     Status: Abnormal     Collection Time    11/01/13  5:00 AM      Result Value Ref Range   Sodium 141  137 - 147 mEq/L   Potassium 4.3  3.7 - 5.3 mEq/L   Chloride 92 (*) 96 - 112 mEq/L   CO2 41 (*) 19 - 32 mEq/L   Comment: CRITICAL RESULT CALLED TO, READ BACK BY AND VERIFIED WITH:     L'ESPERANCE RN [id number] 11/01/13 E.GADDY   Glucose, Bld 98  70 - 99 mg/dL   BUN 15  6 - 23 mg/dL   Creatinine, Ser 1.30  0.50 -  1.35 mg/dL   Calcium 8.4  8.4 - 10.5 mg/dL   GFR calc non Af Amer 72 (*) >90 mL/min   GFR calc Af Amer 84 (*) >90 mL/min   Comment: (NOTE)     The eGFR has been calculated using the CKD EPI equation.     This calculation has not been validated in all clinical situations.     eGFR's persistently <90 mL/min signify possible Chronic Kidney     Disease.   Anion gap 8  5 - 15    Dg Chest 2 View  10/31/2013   CLINICAL DATA:  Acute chest pain.  EXAM: CHEST  2 VIEW  COMPARISON:  September 27, 2011.  FINDINGS: The heart size and mediastinal contours are within normal limits. Both lungs are clear. No pneumothorax or pleural effusion is noted. The visualized skeletal structures are unremarkable.  IMPRESSION: No acute cardiopulmonary abnormality seen.   Electronically Signed   By: Sabino Dick M.D.   On: 10/31/2013 13:27   US Renal  10/31/2013   CLINICAL DATA:  Elevated creatinine.  Hypertension, obesity, CHF.  EXAM: RENAL/URINARY TRACT ULTRASOUND COMPLETE  COMPARISON:  None.  FINDINGS: Right Kidney:  Length: 12.0 cm in sagittal length. Right kidney is diffusely increased in echogenicity, compared to the adjacent liver. Cortical thickness appears within normal limits. Negative for hydronephrosis. No renal mass is identified.  Left Kidney:  Length: 12.6 cm in sagittal length. The cortex of the left kidney is diffusely increased in echogenicity compared to the adjacent spleen. Cortical thickness appears preserved. Negative for hydronephrosis. No renal mass is identified.  Bladder:  Appears normal for degree of bladder  distention. Calculated bladder volume is 227 cc.  IMPRESSION: Markedly increased echogenicity of both kidneys. This finding can be seen in the setting of chronic medical renal disease.  Negative for hydronephrosis.   Electronically Signed   By: Curlene Dolphin M.D.   On: 10/31/2013 18:44   Echocardiogram shows normal LV function, severe left ventricular hypertrophy, moderate right atrial and right ventricular enlargement and findings suggestive of cor pulmonale.  Assessment/Plan 1 Pulmonary hypertension/right-sided heart failure-patient presents with predominantly right-sided heart failure. His pulmonary hypertension is most likely secondary to obesity hypoventilation syndrome, severe obstructive sleep apnea, and tobacco abuse. He also has severe hypertension and there is most likely a contribution from pulmonary venous hypertension. Long discussion today concerning need for weight loss, compliance with home oxygen and compliance with CPAP. His prognosis will be poor otherwise. Would continue Lasix 60 mg IV twice a day and follow renal function closely. Note the patient had a CTA in September of 2013 that showed no pulmonary embolus. Would perform VQ scan for completeness to exclude chronic thromboembolic disease. Lower extremity Dopplers are pending. 2 hypertension-patient has been noncompliant with medications. I stressed importance of compliance. Continue metoprolol for now. Given baseline renal insufficiency he may benefit from an ACE inhibitor in the future once renal function stable on diuretic regimen. 3 Polycythemia-most likely related to chronic hypoxia from obstructive sleep apnea and obesity hypoventilation syndrome. 4 acute kidney failure-follow renal function closely with diuresis. Patient is also being evaluated for nephrotic syndrome. 5 history of tobacco abuse and cocaine use-patient counseled on discontinuing. 6 Morbid obesity-patient counseled on importance of weight loss.  Patient may  benefit from being followed in CHF clinic in the future. Once medical regimen stable consideration could be given to right heart catheterization.  Kirk Ruths MD 11/01/2013, 12:54 PM

## 2013-11-01 NOTE — Progress Notes (Signed)
Utilization review completed.  

## 2013-11-01 NOTE — Progress Notes (Signed)
Pt has voided. Pt has been education and 24hr urine collection has been started.

## 2013-11-01 NOTE — Progress Notes (Signed)
CRITICAL VALUE ALERT  Critical value received:  C02 41  Date of notification:  11/01/13  Time of notification:  0731  Critical value read back:Yes.    Nurse who received alert: Merrily Pew  MD notified (1st page):  Dr. Sloan Leiter  Time of first page:  0732  MD notified (2nd page):  Time of second page:  Responding MD:  Dr. Sloan Leiter  Time MD responded: [phone]

## 2013-11-01 NOTE — Progress Notes (Signed)
  Echocardiogram 2D Echocardiogram has been performed.  Clorine Swing 11/01/2013, 10:51 AM

## 2013-11-01 NOTE — Care Management Note (Signed)
    Page 1 of 2   11/05/2013     6:40:07 PM CARE MANAGEMENT NOTE 11/05/2013  Patient:  Douglas [name], Douglas [name]   Account Number:  [id number]  Date Initiated:  11/01/2013  Documentation initiated by:  Tomi Bamberger  Subjective/Objective Assessment:   dx anasarca , osa  admit- lives with sister. pta indep.  patient has cpap at home and uses oxygen at night only.     Action/Plan:   Anticipated DC Date:  11/05/2013   Anticipated DC Plan:  [place]  CM consult      Choice offered to / List presented to:             Status of service:  Completed, signed off Medicare Important Message given?  NO (If response is "NO", the following Medicare IM given date fields will be blank) Date Medicare IM given:   Medicare IM given by:   Date Additional Medicare IM given:   Additional Medicare IM given by:    Discharge Disposition:  HOME/SELF CARE  Per UR Regulation:  Reviewed for med. necessity/level of care/duration of stay  If discussed at Box of Stay Meetings, dates discussed:    Comments:  11/05/13 Ken Caryl, BSN [phone] patient did not qualify for home continous oxygen, his sats stayed in the 90's per Staff RN.  NCM informed patient again to call [employer] about his watershed, he will need to purchase another watershed from them for his cpap per [employer].  11/04/2013 1320 Received call from [place], [employer] rep and pt has nocturnal oxygen at 1L and Bipap at home. States pt will need to be continuous at home for portable. Will need to requalify pt for continuous oxygen for home to receive a portable tank at dc. NCM spoke to pt and feels he does need oxygen continuously. Jonnie Finner RN CCM Jonnie Finner RN CCM Case Mgmt phone [phone]  11/04/2013 1300 NCM left message for [employer] rep for portable tank for home. Jonnie Finner RN CCM Case Mgmt phone [phone]  11/01/13 [place], BSN [phone] NCM spoke with patient,  and he states he has no problem affording his medications at dc.  Patient has CPAP through [place]. He states he needs another watershed.  NCM called [facility], spoke with Jeneen Rinks and he states medicaid does not cover the watershed device on the cpap, patient will need to call the 800 number on the device and speak with customer service and he will have to purchase this through [facility].  Jeneen Rinks with [employer] states that patient may need a script from Dr. Jeanie Cooks as well but not sure.  Patient states he will call the number and purchase it from [employer].

## 2013-11-01 NOTE — Progress Notes (Addendum)
PROGRESS NOTE  Douglas [name] HQI:[id number] DOB: 05-Apr-1985 DOA: 10/31/2013 PCP: Philis Fendt, MD  HPI/Subjective: The patient is a 28 yo male with a history of hypertension, morbid obesity, and severe OSA who was admitted for anasarca.    On exam on 10/9, the patient states he is feeling better and reports decreased swelling in his legs and abdomen.    Assessment/Plan: Anasarca  - Suspected related to R heart failure and/or Nephrotic syndrome (see below) - Patient reports swelling has decreased.  Patient was net -3739 on 10/8.   - Maintain negative fluid balance. Continue IV lasix 60mg  q12 hours - Monitor I/Os, and daily weights   Right Heart Failure - Last ECHO 2013 with Dilated RV, LVH and EF of 50% - Repeat ECHO -  Possible cor pulmonale.  Results listed below.Will get VQ Scan/Lower ext Dopplers - Consulted Cardiology. - Heart Healthy diet.  Possible Nephrotic Syndrome - UA shows proteinuria.  Spot Urine Protein is 148.2 and Urine Creatinine shows 61.02. - Renal US showed increased echogenicity - suggestive of chronic medical renal disease - See hypoalbuminemia workup below.  Hypertension  - elevated.  On high dose lasix. - Held ACE and HCTZ due to AKI  - Will start low dose metoprolol and monitor.  Severe OSA on CPAP  - CPAP ordered QHS - Needs help with getting his CPAP machine at home   Polycythemia secondary to chronic hypoxia   Morbid obesity  - Needs life style modification - education.  - Consult dietician.  Hypoalbuminemia  - SPEP, UPEP - pending.   - UA shows proteinuria.  Spot Urine Protein is 148.2 and Urine Creatinine shows 61.02. - Collect 24 hour urine protein and 24 hour urine creatinine. - Nutrition consult requested.  Mild hyperkalemia  - 5.4 on admission.  Corrected with diuresis.  4.3 on 10/9.    History of Cocaine use. -UDS pending.  DVT Prophylaxis:  Lovenox  Code Status: Full Family Communication: No family at bedside.   Patient is awake and alert and understands the plan. Disposition Plan: Remain inpatient.   Procedures:  2D Echo  Venous Doppler with US   Objective: Filed Vitals:   10/31/13 1547 10/31/13 1613 10/31/13 2214 11/01/13 0531  BP: 129/78 158/97 156/95 155/98  Pulse: 88 88 91 89  Temp:  98 F (36.7 C) 99.2 F (37.3 C) 98.2 F (36.8 C)  TempSrc:  Oral Oral Axillary  Resp: 17 18 18 18   Height:  5\' 5"  (1.651 m)    Weight:  133.947 kg (295 lb 4.8 oz)  133.993 kg (295 lb 6.4 oz)  SpO2: 96% 88% 97% 97%    Intake/Output Summary (Last 24 hours) at 11/01/13 1204 Last data filed at 11/01/13 1106  Gross per 24 hour  Intake    634 ml  Output   4075 ml  Net  -3441 ml   Filed Weights   10/31/13 1119 10/31/13 1613 11/01/13 0531  Weight: 122.471 kg (270 lb) 133.947 kg (295 lb 4.8 oz) 133.993 kg (295 lb 6.4 oz)    Exam: General: Morbidly obese male in NAD, tearful on exam, appears stated age  HEENT:  Anicteic Sclera, MMM.  Neck is supple, no masses, JVD present with hepatojugular reflux.  Cardiovascular: RRR, S1 S2 auscultated, no rubs, murmurs or gallops.   Respiratory: Diminished breath sounds bilaterally with equal chest rise  Abdomen: Obese, Soft, nontender, nondistended, + bowel sounds  Extremities: Warm dry without cyanosis or clubbing.  1+ pedal edema.  2+  distal pulses.  Neuro: AAOx3, cranial nerves grossly intact. Strength 5/5 in upper and lower extremities  Psych: Normal affect and demeanor with intact judgement and insight  Data Reviewed: Basic Metabolic Panel:  Recent Labs Lab 10/31/13 1200 10/31/13 2030 11/01/13 0500  NA 137 141 141  K 5.4* 4.7 4.3  CL 94* 92* 92*  CO2 35* 43* 41*  GLUCOSE 115* 122* 98  BUN 19 17 15   CREATININE 1.46* 1.34 1.30  CALCIUM 8.6 8.6 8.4   Liver Function Tests:  Recent Labs Lab 10/31/13 1200  AST 42*  ALT 31  ALKPHOS 46  BILITOT 0.7  PROT 5.5*  ALBUMIN 2.4*   CBC:  Recent Labs Lab 10/31/13 1200  WBC 11.9*  NEUTROABS  7.7  HGB 18.3*  HCT 61.2*  MCV 95.5  PLT 185   BNP (last 3 results)  Recent Labs  10/31/13 1200  PROBNP 2527.0*   Studies:  2D Echo Study Conclusions:  - Left ventricle: The cavity size was normal. Wall thickness was increased in a pattern of severe LVH. Systolic function was normal. The estimated ejection fraction was in the range of 55% to 60%. - Right ventricle: The cavity size was moderately dilated. - Right atrium: The atrium was moderately dilated. - Atrial septum: No defect or patent foramen ovale was identified. - Impressions: RV dilatation with septal flattening suggests cor pulmonale and significantly elevated PA pressure  Impressions: - RV dilatation with septal flattening suggests cor pulmonale and significantly elevated PA pressure     Dg Chest 2 View  10/31/2013   CLINICAL DATA:  Acute chest pain.  EXAM: CHEST  2 VIEW  COMPARISON:  September 27, 2011.  FINDINGS: The heart size and mediastinal contours are within normal limits. Both lungs are clear. No pneumothorax or pleural effusion is noted. The visualized skeletal structures are unremarkable.  IMPRESSION: No acute cardiopulmonary abnormality seen.   Electronically Signed   By: Sabino Dick M.D.   On: 10/31/2013 13:27   US Renal  10/31/2013   CLINICAL DATA:  Elevated creatinine.  Hypertension, obesity, CHF.  EXAM: RENAL/URINARY TRACT ULTRASOUND COMPLETE  COMPARISON:  None.  FINDINGS: Right Kidney:  Length: 12.0 cm in sagittal length. Right kidney is diffusely increased in echogenicity, compared to the adjacent liver. Cortical thickness appears within normal limits. Negative for hydronephrosis. No renal mass is identified.  Left Kidney:  Length: 12.6 cm in sagittal length. The cortex of the left kidney is diffusely increased in echogenicity compared to the adjacent spleen. Cortical thickness appears preserved. Negative for hydronephrosis. No renal mass is identified.  Bladder:  Appears normal for degree of bladder  distention. Calculated bladder volume is 227 cc.  IMPRESSION: Markedly increased echogenicity of both kidneys. This finding can be seen in the setting of chronic medical renal disease.  Negative for hydronephrosis.   Electronically Signed   By: Curlene Dolphin M.D.   On: 10/31/2013 18:44    Scheduled Meds: . enoxaparin (LOVENOX) injection  40 mg Subcutaneous Q24H  . furosemide  60 mg Intravenous Q12H  . Influenza vac split quadrivalent PF  0.5 mL Intramuscular Tomorrow-1000  . metoprolol tartrate  12.5 mg Oral BID  . pneumococcal 23 valent vaccine  0.5 mL Intramuscular Tomorrow-1000   Continuous Infusions:   Principal Problem:   Anasarca Active Problems:   Hypertension   OSA on CPAP   Tobacco abuse   Polycythemia secondary to hypoxia   Morbid obesity   Hypoalbuminemia   Acute diastolic heart failure   Proteinuria  Rockwell Germany, PA-S Imogene Burn, PA-C  [facility] Pager [phone]. If 7PM-7AM, please contact night-coverage at [email or web address], password [facility] 11/01/2013, 12:04 PM  LOS: 1 day   Attending Patient was seen, examined,treatment plan was discussed with the  Advance Practice Provider.  I have directly reviewed the clinical findings, lab, imaging studies and management of this patient in detail. I have made the necessary changes to the above noted documentation, and agree with the documentation, as recorded by the Advance Practice Provider.   In short-obese male-with pul HTN presenting with anasarca-likely secondary to Right heart failure. Rest as above  Nena Alexander MD [facility].

## 2013-11-02 DIAGNOSIS — F1910 Other psychoactive substance abuse, uncomplicated: Secondary | ICD-10-CM

## 2013-11-02 DIAGNOSIS — N179 Acute kidney failure, unspecified: Secondary | ICD-10-CM

## 2013-11-02 DIAGNOSIS — R609 Edema, unspecified: Secondary | ICD-10-CM

## 2013-11-02 DIAGNOSIS — G4733 Obstructive sleep apnea (adult) (pediatric): Secondary | ICD-10-CM

## 2013-11-02 DIAGNOSIS — J9601 Acute respiratory failure with hypoxia: Secondary | ICD-10-CM

## 2013-11-02 LAB — PROTEIN, URINE, 24 HOUR
COLLECTION INTERVAL-UPROT: 24 h
Protein, 24H Urine: 2808 mg/d — ABNORMAL HIGH (ref 50–100)
Protein, Urine: 72 mg/dL
URINE TOTAL VOLUME-UPROT: 3900 mL

## 2013-11-02 LAB — BASIC METABOLIC PANEL
ANION GAP: 10 (ref 5–15)
BUN: 18 mg/dL (ref 6–23)
CALCIUM: 9.1 mg/dL (ref 8.4–10.5)
CO2: 39 mEq/L — ABNORMAL HIGH (ref 19–32)
Chloride: 89 mEq/L — ABNORMAL LOW (ref 96–112)
Creatinine, Ser: 1.42 mg/dL — ABNORMAL HIGH (ref 0.50–1.35)
GFR calc non Af Amer: 65 mL/min — ABNORMAL LOW (ref >90)
GFR, EST AFRICAN AMERICAN: 76 mL/min — AB (ref >90)
Glucose, Bld: 101 mg/dL — ABNORMAL HIGH (ref 70–99)
Potassium: 4.1 mEq/L (ref 3.7–5.3)
Sodium: 138 mEq/L (ref 137–147)

## 2013-11-02 LAB — CBC
HEMATOCRIT: 63.9 % — AB (ref 39.0–52.0)
Hemoglobin: 19.7 g/dL — ABNORMAL HIGH (ref 13.0–17.0)
MCH: 28.6 pg (ref 26.0–34.0)
MCHC: 30.8 g/dL (ref 30.0–36.0)
MCV: 92.6 fL (ref 78.0–100.0)
PLATELETS: 188 10*3/uL (ref 150–400)
RBC: 6.9 MIL/uL — ABNORMAL HIGH (ref 4.22–5.81)
RDW: 15.5 % (ref 11.5–15.5)
WBC: 12.4 10*3/uL — AB (ref 4.0–10.5)

## 2013-11-02 LAB — CREATININE, URINE, 24 HOUR
CREATININE 24H UR: 1654 mg/d (ref 800–2000)
CREATININE, URINE: 42.42 mg/dL
Collection Interval-UCRE24: 24 hours
URINE TOTAL VOLUME-UCRE24: 3900 mL

## 2013-11-02 MED ORDER — ASPIRIN EC 325 MG PO TBEC
325.0000 mg | DELAYED_RELEASE_TABLET | Freq: Every day | ORAL | Status: DC
  Administered 2013-11-02 – 2013-11-05 (×4): 325 mg via ORAL
  Filled 2013-11-02 (×4): qty 1

## 2013-11-02 MED ORDER — FUROSEMIDE 10 MG/ML IJ SOLN
40.0000 mg | Freq: Two times a day (BID) | INTRAMUSCULAR | Status: DC
  Administered 2013-11-02 – 2013-11-04 (×6): 40 mg via INTRAVENOUS
  Filled 2013-11-02 (×7): qty 4

## 2013-11-02 MED ORDER — HYDROCODONE-ACETAMINOPHEN 5-325 MG PO TABS
2.0000 | ORAL_TABLET | Freq: Once | ORAL | Status: AC
  Administered 2013-11-02: 2 via ORAL
  Filled 2013-11-02: qty 2

## 2013-11-02 NOTE — Progress Notes (Signed)
Notified Rogue Bussing, NP that pt's left upper arm is swollen where pt received flu and pneumonia vaccines yesterday. Pt rating pain as a 10 with soreness. NP put in order for Vicodin x 1dose. Will continue to monitor pt. Ranelle Oyster, RN

## 2013-11-02 NOTE — ED Provider Notes (Signed)
Medical screening examination/treatment/procedure(s) were conducted as a shared visit with non-physician practitioner(s) and myself.  I personally evaluated the patient during the encounter.   EKG Interpretation   Date/Time:  Thursday October 31 2013 13:28:00 EDT Ventricular Rate:  91 PR Interval:  156 QRS Duration: 79 QT Interval:  382 QTC Calculation: 470 R Axis:   -172 Text Interpretation:  Sinus rhythm Probable right ventricular hypertrophy  Borderline prolonged QT interval Confirmed by [name]  MD, Ovid Curd  [phone]) on 10/31/2013 2:07:16 PM     Patient with shortness of breath. Appears to be CHF exacerbation. Weight is up. It is mostly on home oxygen also. Will admit to internal medicine for his hypoxia  Douglas [name]. [name], MD 11/02/13 1030

## 2013-11-02 NOTE — Progress Notes (Signed)
Pt stated that he is able to put himself on CPAP at night. He has our machine at bedside. RT filled humidity chamber with water and made sure machine was operating correctly. RN aware. RT will continue to monitor.

## 2013-11-02 NOTE — Progress Notes (Signed)
PATIENT DETAILS Name: Douglas [name] Age: 28 y.o. Sex: male Date of Birth: 1985-12-27 Admit Date: 10/31/2013 Admitting Physician Domenic Polite, MD SJG:[id number],UTMLY A, MD  Subjective: No major complaints  Assessment/Plan: Principal Problem:   Anasarca - Secondary to right heart failure- may have a small component of nephrotic syndrome as well. - Admitted and started on IV Lasix, weight decreased to 279 pounds (295 pounds on admission), negative balance of around 9 L so far. - Cardiology following, because of slight bump in creatinine-IV Lasix decreased to 40 mg twice a day. Await 24 urine protein  Active Problems: Cor pulmonale with pulmonary hypertension in the setting of OSA, obesity/hypoventilation syndrome - I have counseled the importance of losing weight, and compliance with CPAP - IV Lasix as above, VQ scan negative for pulmonary embolism.  Proteinuria - Await 24 urine protein. Renal ultrasound showed evidence of chronic medical disease. - Could have focal segmental glomerulosclerosis secondary to obesity  Polycythemia - Chronic issue-suspect secondary to chronic hypoxia - Will start aspirin- Monitor closely may need therapeutic phlebotomy  Hypertension - Controlled with metoprolol and IV Lasix.  Obstructive sleep apnea - Continue with CPAP  Morbid obesity - Counseled regarding importance of weight loss  History of chronic hypoxic respiratory failure - Secondary to obesity hypoventilation syndrome-on home O2 1-2 L per minute  Cocaine use - Urine drug screen positive for cocaine-already on metoprolol-continue cautiously. - Counseled extensively  Noncompliance with CPAP   Disposition: Remain inpatient  DVT Prophylaxis: Prophylactic Lovenox  Code Status: Full code   Family Communication None at bedside  Procedures:  None  CONSULTS:  cardiology  Time spent 40 minutes-which includes 50% of the time with face-to-face with patient/  family and coordinating care related to the above assessment and plan.    MEDICATIONS: Scheduled Meds: . enoxaparin (LOVENOX) injection  40 mg Subcutaneous Q24H  . furosemide  40 mg Intravenous Q12H  . metoprolol tartrate  12.5 mg Oral BID   Continuous Infusions:  PRN Meds:.acetaminophen, acetaminophen, albuterol, ondansetron (ZOFRAN) IV, ondansetron  Antibiotics: Anti-infectives   None       PHYSICAL EXAM: Vital signs in last 24 hours: Filed Vitals:   11/01/13 2224 11/02/13 0537 11/02/13 0641 11/02/13 1537  BP: 152/95 133/96  140/89  Pulse: 81 85  80  Temp: 98.8 F (37.1 C) 98.2 F (36.8 C)    TempSrc: Oral Axillary    Resp: 17 20  20   Height:      Weight:   126.916 kg (279 lb 12.8 oz)   SpO2: 90% 95%  92%    Weight change: 4.445 kg (9 lb 12.8 oz) Filed Weights   10/31/13 1613 11/01/13 0531 11/02/13 0641  Weight: 133.947 kg (295 lb 4.8 oz) 133.993 kg (295 lb 6.4 oz) 126.916 kg (279 lb 12.8 oz)   Body mass index is 46.56 kg/(m^2).   Gen Exam: Awake and alert with clear speech.   Neck: Supple, No JVD.   Chest: B/L Clear.   CVS: S1 S2 Regular, no murmurs.  Abdomen: soft, BS +, non tender, non distended.  Extremities: +edema, lower extremities warm to touch. Neurologic: Non Focal.   Skin: No Rash.   Wounds: N/A.    Intake/Output from previous day:  Intake/Output Summary (Last 24 hours) at 11/02/13 1549 Last data filed at 11/02/13 0641  Gross per 24 hour  Intake      0 ml  Output   2650 ml  Net  -2650 ml  LAB RESULTS: CBC  Recent Labs Lab 10/31/13 1200 11/02/13 0333  WBC 11.9* 12.4*  HGB 18.3* 19.7*  HCT 61.2* 63.9*  PLT 185 188  MCV 95.5 92.6  MCH 28.5 28.6  MCHC 29.9* 30.8  RDW 15.3 15.5  LYMPHSABS 2.5  --   MONOABS 1.3*  --   EOSABS 0.2  --   BASOSABS 0.0  --     Chemistries   Recent Labs Lab 10/31/13 1200 10/31/13 2030 11/01/13 0500 11/02/13 0333  NA 137 141 141 138  K 5.4* 4.7 4.3 4.1  CL 94* 92* 92* 89*  CO2 35* 43*  41* 39*  GLUCOSE 115* 122* 98 101*  BUN 19 17 15 18   CREATININE 1.46* 1.34 1.30 1.42*  CALCIUM 8.6 8.6 8.4 9.1    CBG: No results found for this basename: GLUCAP,  in the last 168 hours  GFR Estimated Creatinine Clearance: 94.4 ml/min (by C-G formula based on Cr of 1.42).  Coagulation profile No results found for this basename: INR, PROTIME,  in the last 168 hours  Cardiac Enzymes No results found for this basename: CK, CKMB, TROPONINI, MYOGLOBIN,  in the last 168 hours  No components found with this basename: POCBNP,  No results found for this basename: DDIMER,  in the last 72 hours No results found for this basename: HGBA1C,  in the last 72 hours No results found for this basename: CHOL, HDL, LDLCALC, TRIG, CHOLHDL, LDLDIRECT,  in the last 72 hours No results found for this basename: TSH, T4TOTAL, FREET3, T3FREE, THYROIDAB,  in the last 72 hours No results found for this basename: VITAMINB12, FOLATE, FERRITIN, TIBC, IRON, RETICCTPCT,  in the last 72 hours No results found for this basename: LIPASE, AMYLASE,  in the last 72 hours  Urine Studies No results found for this basename: UACOL, UAPR, USPG, UPH, UTP, UGL, UKET, UBIL, UHGB, UNIT, UROB, ULEU, UEPI, UWBC, URBC, UBAC, CAST, CRYS, UCOM, BILUA,  in the last 72 hours  MICROBIOLOGY: No results found for this or any previous visit (from the past 240 hour(s)).  RADIOLOGY STUDIES/RESULTS: Dg Chest 2 View  10/31/2013   CLINICAL DATA:  Acute chest pain.  EXAM: CHEST  2 VIEW  COMPARISON:  September 27, 2011.  FINDINGS: The heart size and mediastinal contours are within normal limits. Both lungs are clear. No pneumothorax or pleural effusion is noted. The visualized skeletal structures are unremarkable.  IMPRESSION: No acute cardiopulmonary abnormality seen.   Electronically Signed   By: Sabino Dick M.D.   On: 10/31/2013 13:27   US Renal  10/31/2013   CLINICAL DATA:  Elevated creatinine.  Hypertension, obesity, CHF.  EXAM:  RENAL/URINARY TRACT ULTRASOUND COMPLETE  COMPARISON:  None.  FINDINGS: Right Kidney:  Length: 12.0 cm in sagittal length. Right kidney is diffusely increased in echogenicity, compared to the adjacent liver. Cortical thickness appears within normal limits. Negative for hydronephrosis. No renal mass is identified.  Left Kidney:  Length: 12.6 cm in sagittal length. The cortex of the left kidney is diffusely increased in echogenicity compared to the adjacent spleen. Cortical thickness appears preserved. Negative for hydronephrosis. No renal mass is identified.  Bladder:  Appears normal for degree of bladder distention. Calculated bladder volume is 227 cc.  IMPRESSION: Markedly increased echogenicity of both kidneys. This finding can be seen in the setting of chronic medical renal disease.  Negative for hydronephrosis.   Electronically Signed   By: Curlene Dolphin M.D.   On: 10/31/2013 18:44   Nm Pulmonary Perf  And Vent  11/01/2013   CLINICAL DATA:  28 year old male with shortness of breath and chest pain.  EXAM: NUCLEAR MEDICINE VENTILATION - PERFUSION LUNG SCAN  TECHNIQUE: Ventilation images were obtained in multiple projections using inhaled aerosol technetium 99 M DTPA. Perfusion images were obtained in multiple projections after intravenous injection of Tc-35m MAA.  RADIOPHARMACEUTICALS:  Forty mCi Tc-59m DTPA aerosol and 6 mCi Tc-83m MAA  COMPARISON:  Chest x-ray 10/31/2013  FINDINGS: Ventilation: Areas of air trapping an anterior upper lobes bilaterally.  Perfusion: No wedge shaped peripheral perfusion defects to suggest acute pulmonary embolism.  IMPRESSION: 1. Negative for pulmonary embolism. The perfusion portion of the examination is normal. 2. Small areas of air trapping noted in the anterior aspect of the upper lobes bilaterally.   Electronically Signed   By: Jacqulynn Cadet M.D.   On: 11/01/2013 14:36    Oren Binet, MD  [facility] Pager:[phone]  If 7PM-7AM, please contact  night-coverage [email or web address] Password [email or web address] 11/02/2013, 3:49 PM   LOS: 2 days

## 2013-11-02 NOTE — Progress Notes (Signed)
Nutrition Brief Note  Consult received for education on CHF and obesity.  Pt out of room most of the day 10/9. Pt in bathroom at RD visit. RD came back to room and pt remains in the bathroom. Left pt reading materials to review on diet.  Will follow up on Monday for questions.   Wt Readings from Last 15 Encounters:  11/02/13 279 lb 12.8 oz (126.916 kg)  10/02/11 259 lb 11.2 oz (117.799 kg)    Body mass index is 46.56 kg/(m^2). Patient meets criteria for severe obesity, class III based on current BMI.   Current diet order is Heart Healthy. Labs and medications reviewed.   No nutrition interventions warranted at this time. If nutrition issues arise, please consult RD.   [place], [place], [place] Pager [phone] After Hours Pager

## 2013-11-02 NOTE — Progress Notes (Signed)
*  PRELIMINARY RESULTS* Vascular Ultrasound Lower extremity venous duplex has been completed.  Preliminary findings: no evidence of DVT.  Landry Mellow, RDMS, RVT  11/02/2013, 4:00 PM

## 2013-11-02 NOTE — Progress Notes (Signed)
Consulting cardiologist: Dr. Kirk Ruths  Subjective:   Patient resting this morning on CPAP.   Objective:   Temp:  [98.2 F (36.8 C)-98.8 F (37.1 C)] 98.2 F (36.8 C) (10/10 0537) Pulse Rate:  [81-98] 85 (10/10 0537) Resp:  [17-20] 20 (10/10 0537) BP: (119-152)/(82-96) 133/96 mmHg (10/10 0537) SpO2:  [90 %-98 %] 95 % (10/10 0537) Weight:  [279 lb 12.8 oz (126.916 kg)] 279 lb 12.8 oz (126.916 kg) (10/10 0641) Last BM Date: 11/01/13  Filed Weights   10/31/13 1613 11/01/13 0531 11/02/13 0641  Weight: 295 lb 4.8 oz (133.947 kg) 295 lb 6.4 oz (133.993 kg) 279 lb 12.8 oz (126.916 kg)    Intake/Output Summary (Last 24 hours) at 11/02/13 0847 Last data filed at 11/02/13 0641  Gross per 24 hour  Intake    498 ml  Output   6000 ml  Net  -5502 ml    Exam:  General: Morbidly obese.  Lungs: Decreased breath sounds, no rales.  Cardiac: RRR, indistinct PMI without gallop.  Abdomen: Protuberant/obese.  Extremities: Chronic appearing mild edema.   Lab Results:  Basic Metabolic Panel:  Recent Labs Lab 10/31/13 2030 11/01/13 0500 11/02/13 0333  NA 141 141 138  K 4.7 4.3 4.1  CL 92* 92* 89*  CO2 43* 41* 39*  GLUCOSE 122* 98 101*  BUN 17 15 18   CREATININE 1.34 1.30 1.42*  CALCIUM 8.6 8.4 9.1    Liver Function Tests:  Recent Labs Lab 10/31/13 1200  AST 42*  ALT 31  ALKPHOS 46  BILITOT 0.7  PROT 5.5*  ALBUMIN 2.4*    CBC:  Recent Labs Lab 10/31/13 1200 11/02/13 0333  WBC 11.9* 12.4*  HGB 18.3* 19.7*  HCT 61.2* 63.9*  MCV 95.5 92.6  PLT 185 188    Echocardiogram (11/01/13):  Study Conclusions  - Left ventricle: The cavity size was normal. Wall thickness was increased in a pattern of severe LVH. Systolic function was normal. The estimated ejection fraction was in the range of 55% to 60%. - Right ventricle: The cavity size was moderately dilated. - Right atrium: The atrium was moderately dilated. - Atrial septum: No defect or patent  foramen ovale was identified. - Impressions: RV dilatation with septal flattening suggests cor pulmonale and significantly elevated PA pressure  Impressions:  - RV dilatation with septal flattening suggests cor pulmonale and significantly elevated PA pressure  Imaging:  EXAM:  NUCLEAR MEDICINE VENTILATION - PERFUSION LUNG SCAN  TECHNIQUE:  Ventilation images were obtained in multiple projections using  inhaled aerosol technetium 99 M DTPA. Perfusion images were obtained  in multiple projections after intravenous injection of Tc-62m MAA.  RADIOPHARMACEUTICALS: Forty mCi Tc-40m DTPA aerosol and 6 mCi  Tc-80m MAA  COMPARISON: Chest x-ray 10/31/2013  FINDINGS:  Ventilation: Areas of air trapping an anterior upper lobes  bilaterally.  Perfusion: No wedge shaped peripheral perfusion defects to suggest  acute pulmonary embolism.  IMPRESSION:  1. Negative for pulmonary embolism. The perfusion portion of the  examination is normal.  2. Small areas of air trapping noted in the anterior aspect of the  upper lobes bilaterally.   Medications:   Scheduled Medications: . enoxaparin (LOVENOX) injection  40 mg Subcutaneous Q24H  . furosemide  60 mg Intravenous Q12H  . metoprolol tartrate  12.5 mg Oral BID      PRN Medications:  acetaminophen, acetaminophen, albuterol, ondansetron (ZOFRAN) IV, ondansetron   Assessment:   1. Cor pulmonale with pulmonary hypertension in the setting of OSA,  obesity/hypoventilation syndrome. 5 L out more than in last 24 hours on IV Lasix.  2. Noncompliance with medical therapy and CPAP.  3. Essential hypertension, blood pressure not controlled in the setting of medication noncompliance.  4. Acute on chronic renal insufficiency, creatinine up from 1.3 1.4.  5. Substance abuse including tobacco and cocaine. UDS positive for cocaine.   Plan/Discussion:    VQ scan negative for obvious chronic thromboembolic disease. He has had significant diuresis  so far, creatinine bumping up slightly. Reduce dose of Lasix for now. 24 hour urine collection noted to assess for nephrotic syndrome. Eventually consider adding ACE inhibitor or Norvasc once renal function stabilizes.   Satira Sark, M.D., F.A.C.C.

## 2013-11-03 LAB — BASIC METABOLIC PANEL
ANION GAP: 18 — AB (ref 5–15)
BUN: 15 mg/dL (ref 6–23)
CO2: 28 mEq/L (ref 19–32)
CREATININE: 1.12 mg/dL (ref 0.50–1.35)
Calcium: 9 mg/dL (ref 8.4–10.5)
Chloride: 90 mEq/L — ABNORMAL LOW (ref 96–112)
GFR calc non Af Amer: 87 mL/min — ABNORMAL LOW (ref >90)
Glucose, Bld: 99 mg/dL (ref 70–99)
Potassium: 5 mEq/L (ref 3.7–5.3)
Sodium: 136 mEq/L — ABNORMAL LOW (ref 137–147)

## 2013-11-03 LAB — CBC
HEMATOCRIT: 63.9 % — AB (ref 39.0–52.0)
Hemoglobin: 19.8 g/dL — ABNORMAL HIGH (ref 13.0–17.0)
MCH: 28.7 pg (ref 26.0–34.0)
MCHC: 31 g/dL (ref 30.0–36.0)
MCV: 92.5 fL (ref 78.0–100.0)
Platelets: 164 10*3/uL (ref 150–400)
RBC: 6.91 MIL/uL — ABNORMAL HIGH (ref 4.22–5.81)
RDW: 15.6 % — ABNORMAL HIGH (ref 11.5–15.5)
WBC: 12 10*3/uL — ABNORMAL HIGH (ref 4.0–10.5)

## 2013-11-03 MED ORDER — LISINOPRIL 5 MG PO TABS
5.0000 mg | ORAL_TABLET | Freq: Every day | ORAL | Status: DC
  Administered 2013-11-03 – 2013-11-05 (×3): 5 mg via ORAL
  Filled 2013-11-03 (×3): qty 1

## 2013-11-03 NOTE — Progress Notes (Signed)
Patient states that someone spoke with him earlier today about cutting back on his lasix. He states that he is not suppose to have another one today. RN stated to patient that he does have one due tonight and educated him on the benefits of taking it. Patient asked that I call NP to see if dose still needs to be given. RN spoke with NP Rogue Bussing about medication. NP stated that dose was just cut back yesterday but nothing new was added to the order today. Patient educated on the importance of taking the medication- he has agreed.

## 2013-11-03 NOTE — Progress Notes (Addendum)
PATIENT DETAILS Name: Douglas [name] Age: 28 y.o. Sex: male Date of Birth: 1985/06/19 Admit Date: 10/31/2013 Admitting Physician Domenic Polite, MD OMV:[id number],SJGGE A, MD  Subjective: No major complaints  Assessment/Plan: Principal Problem:   Anasarca - Secondary to right heart failure- may have a small component of nephrotic syndrome (?FSGS from obesity) as well-24 hour urine protein 2.8 gm - Admitted and started on IV Lasix, weight decreased to 277 pounds (295 pounds on admission), negative balance of around 10.7 L so far. - Cardiology following, on IV Lasix decreased to 40 mg twice a day.   Active Problems: Cor pulmonale with pulmonary hypertension in the setting of OSA, obesity/hypoventilation syndrome - I have counseled the importance of losing weight, and compliance with CPAP - IV Lasix as above, VQ scan negative for pulmonary embolism, Lower ext dopplers neg for DVT  ARF -secondary to IV lasix-dose adjusted-creatinine back to normal  Proteinuria -24 urine protein around 2.8 gm . Renal ultrasound showed evidence of chronic medical disease. Could have focal segmental glomerulosclerosis secondary to obesity. Will benefit from starting  ACEI-suspect renal evaluation can be done in the outpatient setting.Check HIV, ANA, Hep B/C for completeness.  Polycythemia - Chronic issue-suspect secondary to chronic hypoxia - Will start aspirin- Monitor closely may need therapeutic phlebotomy  Hypertension - Controlled with metoprolol and IV Lasix.  Obstructive sleep apnea - Continue with CPAP  Morbid obesity - Counseled regarding importance of weight loss  History of chronic hypoxic respiratory failure - Secondary to obesity hypoventilation syndrome-on home O2 1-2 L per minute  Cocaine use - Urine drug screen positive for cocaine-already on metoprolol-continue cautiously. - Counseled extensively  Noncompliance with CPAP   Disposition: Remain inpatient-suspect  home in am  DVT Prophylaxis: Prophylactic Lovenox  Code Status: Full code   Family Communication None at bedside  Procedures:  None  CONSULTS:  cardiology  MEDICATIONS: Scheduled Meds: . aspirin EC  325 mg Oral Daily  . enoxaparin (LOVENOX) injection  40 mg Subcutaneous Q24H  . furosemide  40 mg Intravenous Q12H  . metoprolol tartrate  12.5 mg Oral BID   Continuous Infusions:  PRN Meds:.acetaminophen, acetaminophen, albuterol, ondansetron (ZOFRAN) IV, ondansetron  Antibiotics: Anti-infectives   None       PHYSICAL EXAM: Vital signs in last 24 hours: Filed Vitals:   11/02/13 2348 11/03/13 0040 11/03/13 0500 11/03/13 0601  BP: 153/102 144/97  138/89  Pulse: 80   88  Temp:    98.2 F (36.8 C)  TempSrc:    Oral  Resp:    20  Height:      Weight:   126.055 kg (277 lb 14.4 oz)   SpO2:    93%    Weight change: -0.862 kg (-1 lb 14.4 oz) Filed Weights   11/01/13 0531 11/02/13 0641 11/03/13 0500  Weight: 133.993 kg (295 lb 6.4 oz) 126.916 kg (279 lb 12.8 oz) 126.055 kg (277 lb 14.4 oz)   Body mass index is 46.25 kg/(m^2).   Gen Exam: Awake and alert with clear speech.  Not in any distress Neck: Supple, No JVD.   Chest: B/L Clear.  No rales or rhonchi. CVS: S1 S2 Regular, no murmurs.  Abdomen: soft, BS +, non tender, non distended.  Extremities: +edema, lower extremities warm to touch. Neurologic: Non Focal.   Skin: No Rash.   Wounds: N/A.    Intake/Output from previous day:  Intake/Output Summary (Last 24 hours) at 11/03/13 1108 Last data filed at  11/03/13 0806  Gross per 24 hour  Intake    118 ml  Output   1600 ml  Net  -1482 ml     LAB RESULTS: CBC  Recent Labs Lab 10/31/13 1200 11/02/13 0333  WBC 11.9* 12.4*  HGB 18.3* 19.7*  HCT 61.2* 63.9*  PLT 185 188  MCV 95.5 92.6  MCH 28.5 28.6  MCHC 29.9* 30.8  RDW 15.3 15.5  LYMPHSABS 2.5  --   MONOABS 1.3*  --   EOSABS 0.2  --   BASOSABS 0.0  --     Chemistries   Recent Labs Lab  10/31/13 1200 10/31/13 2030 11/01/13 0500 11/02/13 0333 11/03/13 0520  NA 137 141 141 138 136*  K 5.4* 4.7 4.3 4.1 5.0  CL 94* 92* 92* 89* 90*  CO2 35* 43* 41* 39* 28  GLUCOSE 115* 122* 98 101* 99  BUN 19 17 15 18 15   CREATININE 1.46* 1.34 1.30 1.42* 1.12  CALCIUM 8.6 8.6 8.4 9.1 9.0    CBG: No results found for this basename: GLUCAP,  in the last 168 hours  GFR Estimated Creatinine Clearance: 119.1 ml/min (by C-G formula based on Cr of 1.12).  Coagulation profile No results found for this basename: INR, PROTIME,  in the last 168 hours  Cardiac Enzymes No results found for this basename: CK, CKMB, TROPONINI, MYOGLOBIN,  in the last 168 hours  No components found with this basename: POCBNP,  No results found for this basename: DDIMER,  in the last 72 hours No results found for this basename: HGBA1C,  in the last 72 hours No results found for this basename: CHOL, HDL, LDLCALC, TRIG, CHOLHDL, LDLDIRECT,  in the last 72 hours No results found for this basename: TSH, T4TOTAL, FREET3, T3FREE, THYROIDAB,  in the last 72 hours No results found for this basename: VITAMINB12, FOLATE, FERRITIN, TIBC, IRON, RETICCTPCT,  in the last 72 hours No results found for this basename: LIPASE, AMYLASE,  in the last 72 hours  Urine Studies No results found for this basename: UACOL, UAPR, USPG, UPH, UTP, UGL, UKET, UBIL, UHGB, UNIT, UROB, ULEU, UEPI, UWBC, URBC, UBAC, CAST, CRYS, UCOM, BILUA,  in the last 72 hours  MICROBIOLOGY: No results found for this or any previous visit (from the past 240 hour(s)).  RADIOLOGY STUDIES/RESULTS: Dg Chest 2 View  10/31/2013   CLINICAL DATA:  Acute chest pain.  EXAM: CHEST  2 VIEW  COMPARISON:  September 27, 2011.  FINDINGS: The heart size and mediastinal contours are within normal limits. Both lungs are clear. No pneumothorax or pleural effusion is noted. The visualized skeletal structures are unremarkable.  IMPRESSION: No acute cardiopulmonary abnormality seen.    Electronically Signed   By: Sabino Dick M.D.   On: 10/31/2013 13:27   US Renal  10/31/2013   CLINICAL DATA:  Elevated creatinine.  Hypertension, obesity, CHF.  EXAM: RENAL/URINARY TRACT ULTRASOUND COMPLETE  COMPARISON:  None.  FINDINGS: Right Kidney:  Length: 12.0 cm in sagittal length. Right kidney is diffusely increased in echogenicity, compared to the adjacent liver. Cortical thickness appears within normal limits. Negative for hydronephrosis. No renal mass is identified.  Left Kidney:  Length: 12.6 cm in sagittal length. The cortex of the left kidney is diffusely increased in echogenicity compared to the adjacent spleen. Cortical thickness appears preserved. Negative for hydronephrosis. No renal mass is identified.  Bladder:  Appears normal for degree of bladder distention. Calculated bladder volume is 227 cc.  IMPRESSION: Markedly increased echogenicity of both  kidneys. This finding can be seen in the setting of chronic medical renal disease.  Negative for hydronephrosis.   Electronically Signed   By: Curlene Dolphin M.D.   On: 10/31/2013 18:44   Nm Pulmonary Perf And Vent  11/01/2013   CLINICAL DATA:  28 year old male with shortness of breath and chest pain.  EXAM: NUCLEAR MEDICINE VENTILATION - PERFUSION LUNG SCAN  TECHNIQUE: Ventilation images were obtained in multiple projections using inhaled aerosol technetium 99 M DTPA. Perfusion images were obtained in multiple projections after intravenous injection of Tc-29m MAA.  RADIOPHARMACEUTICALS:  Forty mCi Tc-67m DTPA aerosol and 6 mCi Tc-83m MAA  COMPARISON:  Chest x-ray 10/31/2013  FINDINGS: Ventilation: Areas of air trapping an anterior upper lobes bilaterally.  Perfusion: No wedge shaped peripheral perfusion defects to suggest acute pulmonary embolism.  IMPRESSION: 1. Negative for pulmonary embolism. The perfusion portion of the examination is normal. 2. Small areas of air trapping noted in the anterior aspect of the upper lobes bilaterally.    Electronically Signed   By: Jacqulynn Cadet M.D.   On: 11/01/2013 14:36    Oren Binet, MD  [facility] Pager:[phone]  If 7PM-7AM, please contact night-coverage [email or web address] Password [email or web address] 11/03/2013, 11:08 AM   LOS: 3 days

## 2013-11-03 NOTE — Progress Notes (Signed)
Consulting cardiologist: Dr. Kirk Ruths  Subjective:   Awake this AM on CPAP. States tolerating. No chest pain.   Objective:   Temp:  [98.2 F (36.8 C)-98.5 F (36.9 C)] 98.2 F (36.8 C) (10/11 0601) Pulse Rate:  [77-88] 88 (10/11 0601) Resp:  [20] 20 (10/11 0601) BP: (136-153)/(89-102) 138/89 mmHg (10/11 0601) SpO2:  [92 %-96 %] 93 % (10/11 0601) Weight:  [277 lb 14.4 oz (126.055 kg)] 277 lb 14.4 oz (126.055 kg) (10/11 0500) Last BM Date: 11/01/13  Filed Weights   11/01/13 0531 11/02/13 0641 11/03/13 0500  Weight: 295 lb 6.4 oz (133.993 kg) 279 lb 12.8 oz (126.916 kg) 277 lb 14.4 oz (126.055 kg)    Intake/Output Summary (Last 24 hours) at 11/03/13 0717 Last data filed at 11/03/13 0630  Gross per 24 hour  Intake      0 ml  Output   1600 ml  Net  -1600 ml    Exam:  General: Morbidly obese.  Lungs: Decreased breath sounds, no rales.  Cardiac: RRR, indistinct PMI without gallop.  Abdomen: Protuberant/obese.  Extremities: Chronic appearing mild edema.   Lab Results:  Basic Metabolic Panel:  Recent Labs Lab 10/31/13 2030 11/01/13 0500 11/02/13 0333  NA 141 141 138  K 4.7 4.3 4.1  CL 92* 92* 89*  CO2 43* 41* 39*  GLUCOSE 122* 98 101*  BUN 17 15 18   CREATININE 1.34 1.30 1.42*  CALCIUM 8.6 8.4 9.1    Liver Function Tests:  Recent Labs Lab 10/31/13 1200  AST 42*  ALT 31  ALKPHOS 46  BILITOT 0.7  PROT 5.5*  ALBUMIN 2.4*    CBC:  Recent Labs Lab 10/31/13 1200 11/02/13 0333  WBC 11.9* 12.4*  HGB 18.3* 19.7*  HCT 61.2* 63.9*  MCV 95.5 92.6  PLT 185 188    Echocardiogram (11/01/13):  Study Conclusions  - Left ventricle: The cavity size was normal. Wall thickness was increased in a pattern of severe LVH. Systolic function was normal. The estimated ejection fraction was in the range of 55% to 60%. - Right ventricle: The cavity size was moderately dilated. - Right atrium: The atrium was moderately dilated. - Atrial septum: No  defect or patent foramen ovale was identified. - Impressions: RV dilatation with septal flattening suggests cor pulmonale and significantly elevated PA pressure  Impressions:  - RV dilatation with septal flattening suggests cor pulmonale and significantly elevated PA pressure  Imaging:  EXAM:  NUCLEAR MEDICINE VENTILATION - PERFUSION LUNG SCAN  TECHNIQUE:  Ventilation images were obtained in multiple projections using  inhaled aerosol technetium 99 M DTPA. Perfusion images were obtained  in multiple projections after intravenous injection of Tc-19m MAA.  RADIOPHARMACEUTICALS: Forty mCi Tc-26m DTPA aerosol and 6 mCi  Tc-2m MAA  COMPARISON: Chest x-ray 10/31/2013  FINDINGS:  Ventilation: Areas of air trapping an anterior upper lobes  bilaterally.  Perfusion: No wedge shaped peripheral perfusion defects to suggest  acute pulmonary embolism.  IMPRESSION:  1. Negative for pulmonary embolism. The perfusion portion of the  examination is normal.  2. Small areas of air trapping noted in the anterior aspect of the  upper lobes bilaterally.   Medications:   Scheduled Medications: . aspirin EC  325 mg Oral Daily  . enoxaparin (LOVENOX) injection  40 mg Subcutaneous Q24H  . furosemide  40 mg Intravenous Q12H  . metoprolol tartrate  12.5 mg Oral BID     PRN Medications: acetaminophen, acetaminophen, albuterol, ondansetron (ZOFRAN) IV, ondansetron   Assessment:  1. Cor pulmonale with pulmonary hypertension in the setting of OSA, obesity/hypoventilation syndrome. 1.6 L out more than in last 24 hours - IV Lasix cut back yesterday.  2. Noncompliance with medical therapy and CPAP.  3. Essential hypertension.  4. Acute on chronic renal insufficiency, creatinine up from 1.3 - 1.4.  5. Substance abuse including tobacco and cocaine. UDS positive for cocaine.   Plan/Discussion:    Continue Lasix at 40 mg IV BID for now. Weight down 2 more pounds - watch creatinine with mild  bump yesterday. May be nearing transition to PO. 24 hour urine collection noted to assess for nephrotic syndrome. Eventually consider adding ACE inhibitor or Norvasc once renal function stabilizes.   Satira Sark, M.D., F.A.C.C.

## 2013-11-04 DIAGNOSIS — E669 Obesity, unspecified: Secondary | ICD-10-CM

## 2013-11-04 LAB — PROTEIN ELECTROPHORESIS, SERUM
ALPHA-2-GLOBULIN: 10.5 % (ref 7.1–11.8)
Albumin ELP: 52.3 % — ABNORMAL LOW (ref 55.8–66.1)
Alpha-1-Globulin: 6.3 % — ABNORMAL HIGH (ref 2.9–4.9)
BETA 2: 5.7 % (ref 3.2–6.5)
Beta Globulin: 7.3 % — ABNORMAL HIGH (ref 4.7–7.2)
Gamma Globulin: 17.9 % (ref 11.1–18.8)
M-SPIKE, %: NOT DETECTED g/dL
Total Protein ELP: 6 g/dL (ref 6.0–8.3)

## 2013-11-04 LAB — BASIC METABOLIC PANEL
ANION GAP: 7 (ref 5–15)
BUN: 13 mg/dL (ref 6–23)
CALCIUM: 8.7 mg/dL (ref 8.4–10.5)
CO2: 41 mEq/L (ref 19–32)
Chloride: 90 mEq/L — ABNORMAL LOW (ref 96–112)
Creatinine, Ser: 1.24 mg/dL (ref 0.50–1.35)
GFR calc non Af Amer: 77 mL/min — ABNORMAL LOW (ref >90)
GFR, EST AFRICAN AMERICAN: 89 mL/min — AB (ref >90)
GLUCOSE: 86 mg/dL (ref 70–99)
POTASSIUM: 4.1 meq/L (ref 3.7–5.3)
SODIUM: 138 meq/L (ref 137–147)

## 2013-11-04 LAB — HIV ANTIBODY (ROUTINE TESTING W REFLEX): HIV: NONREACTIVE

## 2013-11-04 LAB — UIFE/LIGHT CHAINS/TP QN, 24-HR UR
ALBUMIN, U: DETECTED
ALPHA 2 UR: DETECTED — AB
Alpha 1, Urine: DETECTED — AB
Beta, Urine: DETECTED — AB
Gamma Globulin, Urine: DETECTED — AB
TOTAL PROTEIN, URINE-UPE24: 160 mg/dL — AB (ref 5–25)

## 2013-11-04 LAB — HEPATITIS B SURFACE ANTIGEN: Hepatitis B Surface Ag: NEGATIVE

## 2013-11-04 LAB — ANA: Anti Nuclear Antibody(ANA): NEGATIVE

## 2013-11-04 LAB — HEPATITIS C ANTIBODY (REFLEX): HCV Ab: NEGATIVE

## 2013-11-04 MED ORDER — AMLODIPINE BESYLATE 10 MG PO TABS
10.0000 mg | ORAL_TABLET | Freq: Every day | ORAL | Status: DC
  Administered 2013-11-04 – 2013-11-05 (×2): 10 mg via ORAL
  Filled 2013-11-04 (×2): qty 1

## 2013-11-04 NOTE — Progress Notes (Signed)
Refused labs this morning. States that lab may be able to come by later but not now. Will continue to monitor.

## 2013-11-04 NOTE — Progress Notes (Addendum)
CARE MANAGEMENT NOTE 11/04/2013  Patient:  Douglas [name], Douglas [name]   Account Number:  [id number]  Date Initiated:  11/01/2013  Documentation initiated by:  Tomi Bamberger  Subjective/Objective Assessment:   dx anasarca , osa  admit- lives with sister. pta indep.     Action/Plan:   Anticipated DC Date:  11/03/2013   Anticipated DC Plan:  [place]  CM consult      Choice offered to / List presented to:             Status of service:  In process, will continue to follow Medicare Important Message given?  NO (If response is "NO", the following Medicare IM given date fields will be blank) Date Medicare IM given:   Medicare IM given by:   Date Additional Medicare IM given:   Additional Medicare IM given by:    Discharge Disposition:    Per UR Regulation:  Reviewed for med. necessity/level of care/duration of stay  If discussed at [place] of Stay Meetings, dates discussed:    Comments:  11/04/2013 1320 Received call from [place], [employer] rep and pt has nocturnal oxygen at 1L and Bipap at home. States pt will need to be continuous at home for portable. Will need to requalify pt for continuous oxygen for home to receive a portable tank at dc. NCM spoke to pt and feels he does need oxygen continuously. Jonnie Finner RN CCM Jonnie Finner RN CCM Case Mgmt phone [phone]  11/04/2013 1300 NCM left message for [employer] rep for portable tank for home. Jonnie Finner RN CCM Case Mgmt phone [phone]  11/01/13 [place], BSN [phone] NCM spoke with patient, and he states he has no problem affording his medications at dc.  Patient has CPAP through [place]. He states he needs another watershed.  NCM called [facility], spoke with Jeneen Rinks and he states medicaid does not cover the watershed device on the cpap, patient will need to call the 800 number on the device and speak with customer service and he will have to purchase this through [place].  Jeneen Rinks with [facility]  states that patient may need a script from Dr. Jeanie Cooks as well but not sure.  Patient states he will call the number and purchase it from [place].

## 2013-11-04 NOTE — Progress Notes (Signed)
PATIENT DETAILS Name: Douglas [name] Age: 28 y.o. Sex: male Date of Birth: 09-08-1985 Admit Date: 10/31/2013 Admitting Physician Domenic Polite, MD ZOX:[id number],UJWJX A, MD  Subjective: Patient has no complaints.  Assessment/Plan: Principal Problem:   Anasarca - Secondary to right heart failure- may have a small component of nephrotic syndrome (?FSGS from obesity) as well-24 hour urine protein 2.8 gm - Admitted and started on IV Lasix, weight decreased to 273 pounds (295 pounds on admission), negative balance of around 12.2 L so far. -Significantly improved lower extremity edema. Very mild edema on 10/12, approx 1+.  - Cardiology following, on IV Lasix decreased to 40 mg twice a day.   Active Problems: Cor pulmonale with pulmonary hypertension in the setting of OSA, obesity/hypoventilation syndrome - I have counseled the importance of losing weight, and compliance with CPAP - IV Lasix as above, VQ scan negative for pulmonary embolism, Lower ext dopplers neg for DVT  ARF -secondary to IV lasix-dose adjusted-creatinine back to normal  Proteinuria -24 urine protein around 2.8 gm . Renal ultrasound showed evidence of chronic medical disease. Could have focal segmental glomerulosclerosis secondary to obesity.-HIV results- non-reactive, ANA and Hep B/C are negative. Started 5 mg lisinopril. Spoke with Dr Sheilah Pigeon reviewed his chart, no further workup recommended at this point apart from outpatient follow up with nephrology for further evaluation.  Polycythemia - Chronic issue-suspect secondary to chronic hypoxia - Continue with aspirin, discussed case with Dr. Inda Merlin- recommended therapeutic phlebotomy with removal of 250 cc of blood. Repeat CBC tomorrow.  Hypertension - Controlled with metoprolol and IV Lasix.  Obstructive sleep apnea - Continue with CPAP  Morbid obesity - Counseled regarding importance of weight loss  History of chronic hypoxic respiratory  failure - Secondary to obesity hypoventilation syndrome-on home O2 1-2 L per minute -Continue home O2 protocol on discharge  Cocaine use - Urine drug screen positive for cocaine-already on metoprolol-continue cautiously. - Counseled extensively  Noncompliance with CPAP   Disposition: Remain inpatient-suspect home in am on 10/13.  DVT Prophylaxis: Prophylactic Lovenox  Code Status: Full code   Family Communication None at bedside  Procedures:  None  CONSULTS:  cardiology  MEDICATIONS: Scheduled Meds: . amLODipine  10 mg Oral Daily  . aspirin EC  325 mg Oral Daily  . enoxaparin (LOVENOX) injection  40 mg Subcutaneous Q24H  . furosemide  40 mg Intravenous Q12H  . lisinopril  5 mg Oral Daily   Continuous Infusions:  PRN Meds:.acetaminophen, acetaminophen, albuterol, ondansetron (ZOFRAN) IV, ondansetron  Antibiotics: Anti-infectives   None       PHYSICAL EXAM: Vital signs in last 24 hours: Filed Vitals:   11/03/13 2148 11/03/13 2352 11/04/13 0330 11/04/13 0514  BP: 140/95   144/87  Pulse: 80 78  80  Temp: 98.5 F (36.9 C)   97.6 F (36.4 C)  TempSrc: Oral   Oral  Resp: 20 18  18   Height:      Weight:   123.832 kg (273 lb)   SpO2: 96% 97%  100%    Weight change: -2.223 kg (-4 lb 14.4 oz) Filed Weights   11/02/13 0641 11/03/13 0500 11/04/13 0330  Weight: 126.916 kg (279 lb 12.8 oz) 126.055 kg (277 lb 14.4 oz) 123.832 kg (273 lb)   Body mass index is 45.43 kg/(m^2).   Gen Exam: Awake and alert with clear speech.  Not in any distress Neck: Supple, No JVD.   Chest: B/L Clear.  No rales or rhonchi.  CVS: S1 S2 Regular, no murmurs.  Abdomen: soft, BS +, non tender, non distended.  Extremities: 1+edema, lower extremities warm to touch. Neurologic: Non Focal.   Skin: No Rash.   Wounds: N/A.    Intake/Output from previous day:  Intake/Output Summary (Last 24 hours) at 11/04/13 1136 Last data filed at 11/04/13 1100  Gross per 24 hour  Intake    740  ml  Output   2300 ml  Net  -1560 ml     LAB RESULTS: CBC  Recent Labs Lab 10/31/13 1200 11/02/13 0333 11/03/13 1000  WBC 11.9* 12.4* 12.0*  HGB 18.3* 19.7* 19.8*  HCT 61.2* 63.9* 63.9*  PLT 185 188 164  MCV 95.5 92.6 92.5  MCH 28.5 28.6 28.7  MCHC 29.9* 30.8 31.0  RDW 15.3 15.5 15.6*  LYMPHSABS 2.5  --   --   MONOABS 1.3*  --   --   EOSABS 0.2  --   --   BASOSABS 0.0  --   --     Chemistries   Recent Labs Lab 10/31/13 1200 10/31/13 2030 11/01/13 0500 11/02/13 0333 11/03/13 0520  NA 137 141 141 138 136*  K 5.4* 4.7 4.3 4.1 5.0  CL 94* 92* 92* 89* 90*  CO2 35* 43* 41* 39* 28  GLUCOSE 115* 122* 98 101* 99  BUN 19 17 15 18 15   CREATININE 1.46* 1.34 1.30 1.42* 1.12  CALCIUM 8.6 8.6 8.4 9.1 9.0    GFR Estimated Creatinine Clearance: 117.9 ml/min (by C-G formula based on Cr of 1.12).    RADIOLOGY STUDIES/RESULTS: Dg Chest 2 View  10/31/2013   CLINICAL DATA:  Acute chest pain.  EXAM: CHEST  2 VIEW  COMPARISON:  September 27, 2011.  FINDINGS: The heart size and mediastinal contours are within normal limits. Both lungs are clear. No pneumothorax or pleural effusion is noted. The visualized skeletal structures are unremarkable.  IMPRESSION: No acute cardiopulmonary abnormality seen.   Electronically Signed   By: Sabino Dick M.D.   On: 10/31/2013 13:27   US Renal  10/31/2013   CLINICAL DATA:  Elevated creatinine.  Hypertension, obesity, CHF.  EXAM: RENAL/URINARY TRACT ULTRASOUND COMPLETE  COMPARISON:  None.  FINDINGS: Right Kidney:  Length: 12.0 cm in sagittal length. Right kidney is diffusely increased in echogenicity, compared to the adjacent liver. Cortical thickness appears within normal limits. Negative for hydronephrosis. No renal mass is identified.  Left Kidney:  Length: 12.6 cm in sagittal length. The cortex of the left kidney is diffusely increased in echogenicity compared to the adjacent spleen. Cortical thickness appears preserved. Negative for hydronephrosis.  No renal mass is identified.  Bladder:  Appears normal for degree of bladder distention. Calculated bladder volume is 227 cc.  IMPRESSION: Markedly increased echogenicity of both kidneys. This finding can be seen in the setting of chronic medical renal disease.  Negative for hydronephrosis.   Electronically Signed   By: Curlene Dolphin M.D.   On: 10/31/2013 18:44   Nm Pulmonary Perf And Vent  11/01/2013   CLINICAL DATA:  28 year old male with shortness of breath and chest pain.  EXAM: NUCLEAR MEDICINE VENTILATION - PERFUSION LUNG SCAN  TECHNIQUE: Ventilation images were obtained in multiple projections using inhaled aerosol technetium 99 M DTPA. Perfusion images were obtained in multiple projections after intravenous injection of Tc-59m MAA.  RADIOPHARMACEUTICALS:  Forty mCi Tc-53m DTPA aerosol and 6 mCi Tc-53m MAA  COMPARISON:  Chest x-ray 10/31/2013  FINDINGS: Ventilation: Areas of air trapping an anterior upper lobes  bilaterally.  Perfusion: No wedge shaped peripheral perfusion defects to suggest acute pulmonary embolism.  IMPRESSION: 1. Negative for pulmonary embolism. The perfusion portion of the examination is normal. 2. Small areas of air trapping noted in the anterior aspect of the upper lobes bilaterally.   Electronically Signed   By: Jacqulynn Cadet M.D.   On: 11/01/2013 14:36    Darlyne Russian, MD  [facility] Pager:[phone]  If 7PM-7AM, please contact night-coverage [email or web address] Password [email or web address] 11/04/2013, 11:36 AM   LOS: 4 days   Attending  Patient was seen, examined,treatment plan was discussed with the  Advance Practice Provider.  I have directly reviewed the clinical findings, lab, imaging studies and management of this patient in detail. I have made the necessary changes to the above noted documentation, and agree with the documentation, as recorded by the Advance Practice Provider.   Anasarca from right-sided heart failure, and presumed focal segmental glomerular  sclerosis. Continue with IV Lasix. Developed significant polycythemia-after discussion with Dr. Hale Drone order therapeutic phlebotomy today. Await chemistries today-started on ACE inhibitor yesterday- follow potassium closely.  Nena Alexander MD [facility].

## 2013-11-04 NOTE — Progress Notes (Signed)
SATURATION QUALIFICATIONS: (This note is used to comply with regulatory documentation for home oxygen)  Patient Saturations on Room Air at Rest = 90%  Patient Saturations on Room Air while Ambulating = 89%  Patient Saturations on 2L Liters of oxygen while Ambulating = 93%  Please briefly explain why patient needs home oxygen:

## 2013-11-04 NOTE — Plan of Care (Signed)
Problem: Food- and Nutrition-Related Knowledge Deficit (NB-1.1) Goal: Nutrition education Formal process to instruct or train a patient/client in a skill or to impart knowledge to help patients/clients voluntarily manage or modify food choices and eating behavior to maintain or improve health. Outcome: Adequate for Discharge Nutrition Education Note  RD consulted for nutrition education regarding new onset CHF and weight loss.  RD provided "Low Sodium Nutrition Therapy" handout from the Academy of [employer]etics. Reviewed patient's dietary recall. Provided examples on ways to decrease sodium intake in diet. Discouraged intake of processed foods and use of salt shaker. Encouraged fresh fruits and vegetables as well as whole grain sources of carbohydrates to maximize fiber intake.   RD discussed why it is important for patient to adhere to diet recommendations, and emphasized the role of fluids, foods to avoid, and importance of weighing self daily.   RD provided "Weight Loss Tips" handout from the Academy of [employer] and [employer]etics. Emphasized the importance of serving sizes and provided examples of correct portions of common foods. Discussed importance of controlled and consistent intake throughout the day. Provided examples of ways to balance meals/snacks and encouraged intake of high-fiber, whole grain complex carbohydrates. Emphasized the importance of hydration with calorie-free beverages and limiting sugar-sweetened beverages. Encouraged pt to discuss physical activity options with physician. Teach back method used.  Expect fair to good compliance.  Body mass index is 45.43 kg/(m^2). Pt meets criteria for  based on current BMI.  Current diet order is Heart Healthy, patient is consuming approximately 100% of meals at this time. Labs and medications reviewed. No further nutrition interventions warranted at this time. RD contact information provided. If additional nutrition issues arise,  please re-consult RD.   Dorean Daniello A. Jimmye Norman, RD, LDN Pager: [phone] After hours Pager: [phone]

## 2013-11-04 NOTE — Progress Notes (Signed)
Patient ID: Douglas [name], male   DOB: 1985/07/20, 28 y.o.   MRN: [id number]    Consulting cardiologist: Dr. Kirk Ruths  Subjective:   No chest pain wearing CPAP  Stressed compliance issues    Objective:   Temp:  [97.6 F (36.4 C)-98.5 F (36.9 C)] 97.6 F (36.4 C) (10/12 0514) Pulse Rate:  [78-80] 80 (10/12 0514) Resp:  [18-20] 18 (10/12 0514) BP: (140-144)/(87-95) 144/87 mmHg (10/12 0514) SpO2:  [96 %-100 %] 100 % (10/12 0514) Weight:  [273 lb (123.832 kg)] 273 lb (123.832 kg) (10/12 0330) Last BM Date: 11/01/13  Filed Weights   11/02/13 0641 11/03/13 0500 11/04/13 0330  Weight: 279 lb 12.8 oz (126.916 kg) 277 lb 14.4 oz (126.055 kg) 273 lb (123.832 kg)    Intake/Output Summary (Last 24 hours) at 11/04/13 0926 Last data filed at 11/04/13 0900  Gross per 24 hour  Intake    740 ml  Output   2300 ml  Net  -1560 ml    Exam:  General: Morbidly obese.  Lungs: Decreased breath sounds, no rales.  Cardiac: RRR, indistinct PMI without gallop.  Abdomen: Protuberant/obese.  Extremities: Chronic appearing mild edema.   Lab Results:  Basic Metabolic Panel:  Recent Labs Lab 11/01/13 0500 11/02/13 0333 11/03/13 0520  NA 141 138 136*  K 4.3 4.1 5.0  CL 92* 89* 90*  CO2 41* 39* 28  GLUCOSE 98 101* 99  BUN 15 18 15   CREATININE 1.30 1.42* 1.12  CALCIUM 8.4 9.1 9.0    Liver Function Tests:  Recent Labs Lab 10/31/13 1200  AST 42*  ALT 31  ALKPHOS 46  BILITOT 0.7  PROT 5.5*  ALBUMIN 2.4*    CBC:  Recent Labs Lab 10/31/13 1200 11/02/13 0333 11/03/13 1000  WBC 11.9* 12.4* 12.0*  HGB 18.3* 19.7* 19.8*  HCT 61.2* 63.9* 63.9*  MCV 95.5 92.6 92.5  PLT 185 188 164    Echocardiogram (11/01/13):  Study Conclusions  - Left ventricle: The cavity size was normal. Wall thickness was increased in a pattern of severe LVH. Systolic function was normal. The estimated ejection fraction was in the range of 55% to 60%. - Right ventricle: The cavity  size was moderately dilated. - Right atrium: The atrium was moderately dilated. - Atrial septum: No defect or patent foramen ovale was identified. - Impressions: RV dilatation with septal flattening suggests cor pulmonale and significantly elevated PA pressure  Impressions:  - RV dilatation with septal flattening suggests cor pulmonale and significantly elevated PA pressure  Imaging:  EXAM:  NUCLEAR MEDICINE VENTILATION - PERFUSION LUNG SCAN  TECHNIQUE:  Ventilation images were obtained in multiple projections using  inhaled aerosol technetium 99 M DTPA. Perfusion images were obtained  in multiple projections after intravenous injection of Tc-56m MAA.  RADIOPHARMACEUTICALS: Forty mCi Tc-72m DTPA aerosol and 6 mCi  Tc-71m MAA  COMPARISON: Chest x-ray 10/31/2013  FINDINGS:  Ventilation: Areas of air trapping an anterior upper lobes  bilaterally.  Perfusion: No wedge shaped peripheral perfusion defects to suggest  acute pulmonary embolism.  IMPRESSION:  1. Negative for pulmonary embolism. The perfusion portion of the  examination is normal.  2. Small areas of air trapping noted in the anterior aspect of the  upper lobes bilaterally.   Medications:   Scheduled Medications: . amLODipine  10 mg Oral Daily  . aspirin EC  325 mg Oral Daily  . enoxaparin (LOVENOX) injection  40 mg Subcutaneous Q24H  . furosemide  40 mg Intravenous Q12H  .  lisinopril  5 mg Oral Daily     PRN Medications: acetaminophen, acetaminophen, albuterol, ondansetron (ZOFRAN) IV, ondansetron   Assessment:   1. Cor pulmonale with pulmonary hypertension in the setting of OSA, obesity/hypoventilation syndrome. 1.6 L out more than in last 24 hours - IV Lasix cut back yesterday.  2. Noncompliance with medical therapy and CPAP.  3. Essential hypertension.  4. Acute on chronic renal insufficiency, creatinine up from 1.3 - 1.4.  5. Substance abuse including tobacco and cocaine. UDS positive for  cocaine.   Plan/Discussion:    Continue Lasix at 40 mg IV BID for now. Weight down 1.5 liters more out  -  CR ok  Continue with diuresis    Jenkins Rouge

## 2013-11-05 LAB — BASIC METABOLIC PANEL
ANION GAP: 12 (ref 5–15)
BUN: 13 mg/dL (ref 6–23)
CO2: 33 mEq/L — ABNORMAL HIGH (ref 19–32)
Calcium: 8.9 mg/dL (ref 8.4–10.5)
Chloride: 91 mEq/L — ABNORMAL LOW (ref 96–112)
Creatinine, Ser: 1.05 mg/dL (ref 0.50–1.35)
Glucose, Bld: 85 mg/dL (ref 70–99)
POTASSIUM: 4.5 meq/L (ref 3.7–5.3)
Sodium: 136 mEq/L — ABNORMAL LOW (ref 137–147)

## 2013-11-05 LAB — CBC
HCT: 60.9 % — ABNORMAL HIGH (ref 39.0–52.0)
HEMOGLOBIN: 18.8 g/dL — AB (ref 13.0–17.0)
MCH: 28.4 pg (ref 26.0–34.0)
MCHC: 30.9 g/dL (ref 30.0–36.0)
MCV: 91.9 fL (ref 78.0–100.0)
PLATELETS: 164 10*3/uL (ref 150–400)
RBC: 6.63 MIL/uL — ABNORMAL HIGH (ref 4.22–5.81)
RDW: 15.6 % — AB (ref 11.5–15.5)
WBC: 8.4 10*3/uL (ref 4.0–10.5)

## 2013-11-05 MED ORDER — ALBUTEROL SULFATE HFA 108 (90 BASE) MCG/ACT IN AERS: 1.0000 | INHALATION_SPRAY | Freq: Four times a day (QID) | RESPIRATORY_TRACT | Status: DC | PRN

## 2013-11-05 MED ORDER — AMLODIPINE BESYLATE 10 MG PO TABS: 10.0000 mg | ORAL_TABLET | Freq: Every day | ORAL | Status: DC

## 2013-11-05 MED ORDER — ASPIRIN 325 MG PO TBEC: 325.0000 mg | DELAYED_RELEASE_TABLET | Freq: Every day | ORAL | Status: DC

## 2013-11-05 MED ORDER — LISINOPRIL 5 MG PO TABS
5.0000 mg | ORAL_TABLET | Freq: Every day | ORAL | Status: DC
Start: 2013-11-05 — End: 2014-03-25

## 2013-11-05 MED ORDER — FUROSEMIDE 40 MG PO TABS: 40.0000 mg | ORAL_TABLET | Freq: Two times a day (BID) | ORAL | Status: DC

## 2013-11-05 MED ORDER — FUROSEMIDE 40 MG PO TABS
40.0000 mg | ORAL_TABLET | Freq: Two times a day (BID) | ORAL | Status: DC
  Administered 2013-11-05: 40 mg via ORAL
  Filled 2013-11-05 (×4): qty 1

## 2013-11-05 NOTE — Progress Notes (Signed)
Heart Failure Navigator Consult Note  Presentation:  Douglas [name] s a 28 y.o. male with PMH of HTN, Morbid obesity, Severe OSA, ho non compliance, former smoker presents to the ER.  He is a very poor historian and reports long standing leg swelling, which he has noticed for weeks to months, mostly when he doesn't take his BP meds per patient report.   Past Medical History  Diagnosis Date  . Hypertension   . Obesity   . Tobacco abuse   . Substance abuse 8/31/213    cociane "first time use"  . High cholesterol   . CHF (congestive heart failure)   . Chronic bronchitis   . OSA on CPAP   . Arthritis     "hands, feet" (10/31/2013)  . Pneumonia 09/2011    History   Social History  . Marital Status: Single    Spouse Name: N/A    Number of Children: 2  . Years of Education: N/A   Occupational History  .     Social History Main Topics  . Smoking status: Current Every Day Smoker -- 0.50 packs/day for 9 years    Types: Cigarettes  . Smokeless tobacco: Never Used  . Alcohol Use: 0.6 oz/week    1 Cans of beer per week  . Drug Use: Yes    Special: Cocaine     Comment: 10/31/2013 "might use cocaine once/month"  . Sexual Activity: No   Other Topics Concern  . None   Social History Narrative  . None    ECHO:Study Conclusions--11/01/13  - Left ventricle: The cavity size was normal. Wall thickness was increased in a pattern of severe LVH. Systolic function was normal. The estimated ejection fraction was in the range of 55% to 60%. - Right ventricle: The cavity size was moderately dilated. - Right atrium: The atrium was moderately dilated. - Atrial septum: No defect or patent foramen ovale was identified. - Impressions: RV dilatation with septal flattening suggests cor pulmonale and significantly elevated PA pressure  Impressions:  - RV dilatation with septal flattening suggests cor pulmonale and significantly elevated PA pressure  Transthoracic echocardiography.  M-mode, complete 2D, spectral Doppler, and color Doppler. Birthdate: Patient birthdate: 07-09-1985. Age: Patient is 28 yr old. Sex: Gender: male. BMI: 49.2 kg/m^2. Blood pressure: 155/98 Patient status: Inpatient. Study date: Study date: 11/01/2013. Study time: 08:31 AM. Location: Echo laboratory.   BNP    Component Value Date/Time   PROBNP 2527.0* 10/31/2013 1200    Education Assessment and Provision:  Detailed education and instructions provided on heart failure disease management including the following:  Signs and symptoms of Heart Failure When to call the physician Importance of daily weights Low sodium diet Fluid restriction Medication management Anticipated future follow-up appointments  Patient education given on each of the above topics.  Patient acknowledges understanding and acceptance of all instructions.  I spoke briefly to Douglas [name] related to his HF as he is waiting to be discharged to home (his ride is here).  I did provide a scale -noted that he needed one in Cardiology note.  I reviewed the importance of daily weights and when to call the physician.  Douglas [name] does admit that he has issues related to affording his medications.   He says that he can get most meds today at discharge and the rest at the end of the week.   I reviewed his discharge medications and their actions (which would be considered necessary today).  He says that he can  get and begin those today.  I also clarified that he is to only take those medications listed on his discharge medication list --not those "BP" medications that he has remaining at home.    Education Materials:  "Living Better With Heart Failure" Booklet, Daily Weight Tracker Tool   High Risk Criteria for Readmission and/or Poor Patient Outcomes:    EF <30%- No 55-60%  2 or more admissions in 6 months- No  Difficult social situation- No  Demonstrates medication noncompliance- No   Barriers of Care:  Knowledge of  HF, Health Literacy, Compliance related to previous  Discharge Planning:   Plans to discharge to home.  He has a follow- up appt with Dr Jeanie Cooks within 1 week and Dr Johnsie Cancel.

## 2013-11-05 NOTE — Progress Notes (Signed)
Patient ID: KASEN ADDUCI, male   DOB: 09/17/1985, 28 y.o.   MRN: [id number] Patient ID: HAMZA EMPSON, male   DOB: [birth year], 27 y.o.   MRN: [id number]    Consulting cardiologist: Dr. Kirk Ruths  Subjective:   No chest pain wearing CPAP  Stressed compliance issues    Objective:   Temp:  [97.8 F (36.6 C)-98.5 F (36.9 C)] 97.8 F (36.6 C) (10/13 0524) Pulse Rate:  [77-102] 102 (10/13 0524) Resp:  [18-20] 18 (10/13 0524) BP: (110-146)/(67-95) 110/67 mmHg (10/13 0524) SpO2:  [90 %-95 %] 93 % (10/13 0524) Weight:  [268 lb 4.8 oz (121.7 kg)] 268 lb 4.8 oz (121.7 kg) (10/13 0524) Last BM Date: 11/01/13  Filed Weights   11/03/13 0500 11/04/13 0330 11/05/13 0524  Weight: 277 lb 14.4 oz (126.055 kg) 273 lb (123.832 kg) 268 lb 4.8 oz (121.7 kg)    Intake/Output Summary (Last 24 hours) at 11/05/13 0928 Last data filed at 11/05/13 0529  Gross per 24 hour  Intake    720 ml  Output   4275 ml  Net  -3555 ml    Exam:  General: Morbidly obese.  Lungs: Decreased breath sounds, no rales.  Cardiac: RRR, indistinct PMI without gallop.  Abdomen: Protuberant/obese.  Extremities: Chronic appearing mild edema.   Lab Results:  Basic Metabolic Panel:  Recent Labs Lab 11/03/13 0520 11/04/13 1018 11/05/13 0623  NA 136* 138 136*  K 5.0 4.1 4.5  CL 90* 90* 91*  CO2 28 41* 33*  GLUCOSE 99 86 85  BUN 15 13 13   CREATININE 1.12 1.24 1.05  CALCIUM 9.0 8.7 8.9    Liver Function Tests:  Recent Labs Lab 10/31/13 1200  AST 42*  ALT 31  ALKPHOS 46  BILITOT 0.7  PROT 5.5*  ALBUMIN 2.4*    CBC:  Recent Labs Lab 11/02/13 0333 11/03/13 1000 11/05/13 0623  WBC 12.4* 12.0* 8.4  HGB 19.7* 19.8* 18.8*  HCT 63.9* 63.9* 60.9*  MCV 92.6 92.5 91.9  PLT 188 164 164    Echocardiogram (11/01/13):  Study Conclusions  - Left ventricle: The cavity size was normal. Wall thickness was increased in a pattern of severe LVH. Systolic function was normal. The estimated  ejection fraction was in the range of 55% to 60%. - Right ventricle: The cavity size was moderately dilated. - Right atrium: The atrium was moderately dilated. - Atrial septum: No defect or patent foramen ovale was identified. - Impressions: RV dilatation with septal flattening suggests cor pulmonale and significantly elevated PA pressure  Impressions:  - RV dilatation with septal flattening suggests cor pulmonale and significantly elevated PA pressure  Imaging:  EXAM:  NUCLEAR MEDICINE VENTILATION - PERFUSION LUNG SCAN  TECHNIQUE:  Ventilation images were obtained in multiple projections using  inhaled aerosol technetium 99 M DTPA. Perfusion images were obtained  in multiple projections after intravenous injection of Tc-56m MAA.  RADIOPHARMACEUTICALS: Forty mCi Tc-32m DTPA aerosol and 6 mCi  Tc-45m MAA  COMPARISON: Chest x-ray 10/31/2013  FINDINGS:  Ventilation: Areas of air trapping an anterior upper lobes  bilaterally.  Perfusion: No wedge shaped peripheral perfusion defects to suggest  acute pulmonary embolism.  IMPRESSION:  1. Negative for pulmonary embolism. The perfusion portion of the  examination is normal.  2. Small areas of air trapping noted in the anterior aspect of the  upper lobes bilaterally.   Medications:   Scheduled Medications: . amLODipine  10 mg Oral Daily  . aspirin EC  325 mg Oral  Daily  . enoxaparin (LOVENOX) injection  40 mg Subcutaneous Q24H  . furosemide  40 mg Oral BID  . lisinopril  5 mg Oral Daily     PRN Medications: acetaminophen, acetaminophen, albuterol, ondansetron (ZOFRAN) IV, ondansetron   Assessment:   1. Cor pulmonale with pulmonary hypertension in the setting of OSA, obesity/hypoventilation syndrome. 1.6 L out more than in last 24 hours - IV Lasix cut back yesterday.  2. Noncompliance with medical therapy and CPAP.  3. Essential hypertension.  4. Acute on chronic renal insufficiency, creatinine up from 1.3 - 1.4.  5.  Substance abuse including tobacco and cocaine. UDS positive for cocaine.   Plan/Discussion:    Continue Lasix at 40 mg PO BID for now. Weight down 3.5 liters more out  -  CR ok   Ok to d/c home F/U pulmonary for CPAP ( needs a new machine) and secondary pulmonary hypertension.  Will forward Note to CHF clinic to see if he can get a home scale    Jenkins Rouge

## 2013-11-05 NOTE — Progress Notes (Signed)
Pt given discharge information, prescriptions, and heart failure packet.  PIV removed.  Pt taken to discharge location by wheelchair.

## 2013-11-05 NOTE — Discharge Summary (Signed)
Douglas [name] DJM:[id number] DOB: 1985/06/26 DOA: 10/31/2013  PCP: Philis Fendt, MD  Admit date: 10/31/2013 Discharge date: 11/05/2013  Time spent: 60 minutes  Recommendations for Outpatient Follow-up:  1. Note-patient needs a referral to nephrology for workup of presumed  2. Note-the patient will need followup with cardiology-for right-sided heart failure  3. Patient has significant polycythemia-likely secondary-may need periodic therapeutic phlebotomy, please refer to hematology.  4. Will need periodic followup with pulmonology for titration of CPAP.  5. Please check chemistries in 7 days  6. Please counsel regarding compliance to medications/O2/CPAP, and avoidance of cocaine  Discharge Diagnoses:  Principal Problem:   Anasarca Active Problems:   Hypertension   OSA on CPAP   Tobacco abuse   Polycythemia secondary to hypoxia   Morbid obesity   Hypoalbuminemia   Acute diastolic heart failure   Proteinuria   Pulmonary hypertension   Discharge Condition: Stable  Diet recommendation: Heart healthy, low sodium.  Filed Weights   11/03/13 0500 11/04/13 0330 11/05/13 0524  Weight: 126.055 kg (277 lb 14.4 oz) 123.832 kg (273 lb) 121.7 kg (268 lb 4.8 oz)    History of present illness:  Douglas [name] is an obese 28 yo AA with a history of obstructive sleep apnea, non-compliance, hypertension, with cocaine and tobacco use, who arrived on 10/8 to the ED due to swelling and tightness throughout his body and increased shortness of breath. He is a very poor historian and reports long standing leg swelling, which he has noticed for weeks to months, mostly when he doesn't take his BP meds per patient report. In the last few days he also noticed swelling of his abdominal wall and puffiness around his eyes. He reports for the last month he has been compliant with his medications but has been unable to use his CPAP for 77month due to need for a water chamber. In addition, also has noticed  some dyspnea on exertion for few weeks, he quit smoking  month ago.    Hospital Course:   Principal Problem:  Anasarca  - Secondary to right heart failure- may have a small component of nephrotic syndrome (?FSGS from obesity) as 24 hour urine protein with 2.8 gm  - Admitted and started on IV Lasix, weight decreased to 268 pounds (295 pounds on admission), negative balance of around 16.2 L so far.  -Significantly improved lower extremity edema. Mild edema on 10/13, approx 1+ - Cardiology consulted and followed patient during his stay here, current recommendations are to discharge on 40 mg of Lasix twice a day  Active Problems:  Cor pulmonale with pulmonary hypertension in the setting of OSA, obesity/hypoventilation syndrome -Evidenced by fluid accumulation in belly, extremities. Echo showed right ventricular dilatation with septal flattening, suggesting cor pulmonale and significantly elevated pulmonary artery pressure. -Counseled patient on importance of losing weight, and compliance with CPAP and oxygen -  VQ scan negative for pulmonary embolism, Lower ext dopplers neg for DVT -Cardiology recommendation: Follow-up with outpatient pulmonology to assess need for new cpap and to help with treatment of pulmonary hypertension.   ARF -secondary to IV lasix-dose adjusted-creatinine back to normal - Resolved, creatinine normal on day of discharge.  Proteinuria  -24 urine protein around 2.8 gm . Renal ultrasound showed evidence of chronic medical disease. Could have focal segmental glomerulosclerosis secondary to obesity.-HIV results- non-reactive, ANA and Hep B/C are negative. Started 5 mg lisinopril.Dr Sloan Leiter spoke with Dr Sheilah Pigeon reviewed his chart, no further workup recommended at this point apart from outpatient follow up  with nephrology for further evaluation. Will defer to PCP for referral to nephrology  Polycythemia  -Chronic issue-suspect secondary to chronic hypoxia  -  Therapeutic phlebotomy performed while inpatient, started on aspirin. -Follow-up with hematology to determine etiology and potential treatment. Will defer to PCP to refer to hematology  Hypertension  -Continue Lasix 40 mg BID, amlodipine and lisinopril on discharge on discharge. Follow-up with PCP in 1-2 weeks for further optimization  Obstructive sleep apnea  - Continue with CPAP home regimen upon discharge- case management range for CPAP water chamber.  Morbid obesity  - Counseled regarding importance of weight loss- specially in light of cor pulmonale and presumed focal segmental glomerulosclerosis from obesity  History of chronic hypoxic respiratory failure  -Secondary to obesity hypoventilation syndrome-on home O2 1-2 L per minute  -Continue home O2 protocol on discharge   Cocaine use  - Urine drug screen positive for cocaine- Counseled extensively   Noncompliance with CPAP  -Partially due to need for new water chamber -Case management will arrange prior to discharge   Procedures:  None  Consultations:  Cardiology  Discharge Exam: Filed Vitals:   11/05/13 0524  BP: 110/67  Pulse: 102  Temp: 97.8 F (36.6 C)  Resp: 18    Gen Exam: Obese, awake and alert with clear speech. Not in any distress. Cooperative Neck: Supple, No JVD.  Chest: B/L Clear. No rales or rhonchi.  CVS: S1 S2 Regular, no murmurs.  Abdomen: soft, BS +, non tender, non distended.  Extremities: 1+edema, 5/5 strength in extremities Neurologic: Non Focal.  Skin: No Rash.  Wounds: N/A.    Discharge Instructions  Discharge Instructions   (HEART FAILURE PATIENTS) Call MD:  Anytime you have any of the following symptoms: 1) 3 pound weight gain in 24 hours or 5 pounds in 1 week 2) shortness of breath, with or without a dry hacking cough 3) swelling in the hands, feet or stomach 4) if you have to sleep on extra pillows at night in order to breathe.    Complete by:  As directed      Diet - low sodium  heart healthy    Complete by:  As directed      Increase activity slowly    Complete by:  As directed           Current Discharge Medication List    START taking these medications   Details  amLODipine (NORVASC) 10 MG tablet Take 1 tablet (10 mg total) by mouth daily. Qty: 30 tablet, Refills: 0    aspirin EC 325 MG EC tablet Take 1 tablet (325 mg total) by mouth daily. Qty: 30 tablet, Refills: 0    furosemide (LASIX) 40 MG tablet Take 1 tablet (40 mg total) by mouth 2 (two) times daily. Qty: 60 tablet, Refills: 0    lisinopril (PRINIVIL,ZESTRIL) 5 MG tablet Take 1 tablet (5 mg total) by mouth daily. Qty: 30 tablet, Refills: 0      CONTINUE these medications which have CHANGED   Details  albuterol (PROVENTIL HFA;VENTOLIN HFA) 108 (90 BASE) MCG/ACT inhaler Inhale 1-2 puffs into the lungs every 6 (six) hours as needed for wheezing or shortness of breath. Qty: 1 Inhaler, Refills: 0      STOP taking these medications     lisinopril-hydrochlorothiazide (PRINZIDE,ZESTORETIC) 20-12.5 MG per tablet        No Known Allergies Follow-up Information   Follow up with AVBUERE,EDWIN A, MD. Schedule an appointment as soon as possible for a visit  in 1 week.   Specialty:  Internal Medicine   Contact information:   [place] [address] [phone]       Follow up with [facility]. Schedule an appointment as soon as possible for a visit in 2 weeks. (with a MD of your choice)    Contact information:   [place]  [address] [phone]       Follow up with Jenkins Rouge, MD. Schedule an appointment as soon as possible for a visit in 2 weeks.   Specialty:  Cardiology   Contact information:   [address] [place] [address] [phone]        The results of significant diagnostics from this hospitalization (including imaging, microbiology, ancillary and laboratory) are listed below for reference.    Significant Diagnostic  Studies: Dg Chest 2 View  10/31/2013   CLINICAL DATA:  Acute chest pain.  EXAM: CHEST  2 VIEW  COMPARISON:  September 27, 2011.  FINDINGS: The heart size and mediastinal contours are within normal limits. Both lungs are clear. No pneumothorax or pleural effusion is noted. The visualized skeletal structures are unremarkable.  IMPRESSION: No acute cardiopulmonary abnormality seen.   Electronically Signed   By: Sabino Dick M.D.   On: 10/31/2013 13:27   US Renal  10/31/2013   CLINICAL DATA:  Elevated creatinine.  Hypertension, obesity, CHF.  EXAM: RENAL/URINARY TRACT ULTRASOUND COMPLETE  COMPARISON:  None.  FINDINGS: Right Kidney:  Length: 12.0 cm in sagittal length. Right kidney is diffusely increased in echogenicity, compared to the adjacent liver. Cortical thickness appears within normal limits. Negative for hydronephrosis. No renal mass is identified.  Left Kidney:  Length: 12.6 cm in sagittal length. The cortex of the left kidney is diffusely increased in echogenicity compared to the adjacent spleen. Cortical thickness appears preserved. Negative for hydronephrosis. No renal mass is identified.  Bladder:  Appears normal for degree of bladder distention. Calculated bladder volume is 227 cc.  IMPRESSION: Markedly increased echogenicity of both kidneys. This finding can be seen in the setting of chronic medical renal disease.  Negative for hydronephrosis.   Electronically Signed   By: Curlene Dolphin M.D.   On: 10/31/2013 18:44   Nm Pulmonary Perf And Vent  11/01/2013   CLINICAL DATA:  28 year old male with shortness of breath and chest pain.  EXAM: NUCLEAR MEDICINE VENTILATION - PERFUSION LUNG SCAN  TECHNIQUE: Ventilation images were obtained in multiple projections using inhaled aerosol technetium 99 M DTPA. Perfusion images were obtained in multiple projections after intravenous injection of Tc-53m MAA.  RADIOPHARMACEUTICALS:  Forty mCi Tc-53m DTPA aerosol and 6 mCi Tc-80m MAA  COMPARISON:  Chest x-ray  10/31/2013  FINDINGS: Ventilation: Areas of air trapping an anterior upper lobes bilaterally.  Perfusion: No wedge shaped peripheral perfusion defects to suggest acute pulmonary embolism.  IMPRESSION: 1. Negative for pulmonary embolism. The perfusion portion of the examination is normal. 2. Small areas of air trapping noted in the anterior aspect of the upper lobes bilaterally.   Electronically Signed   By: Jacqulynn Cadet M.D.   On: 11/01/2013 14:36      Labs: Basic Metabolic Panel:  Recent Labs Lab 11/01/13 0500 11/02/13 0333 11/03/13 0520 11/04/13 1018 11/05/13 0623  NA 141 138 136* 138 136*  K 4.3 4.1 5.0 4.1 4.5  CL 92* 89* 90* 90* 91*  CO2 41* 39* 28 41* 33*  GLUCOSE 98 101* 99 86 85  BUN 15 18 15 13  13  CREATININE 1.30 1.42* 1.12 1.24 1.05  CALCIUM 8.4 9.1 9.0 8.7 8.9   Liver Function Tests:  Recent Labs Lab 10/31/13 1200  AST 42*  ALT 31  ALKPHOS 46  BILITOT 0.7  PROT 5.5*  ALBUMIN 2.4*   CBC:  Recent Labs Lab 10/31/13 1200 11/02/13 0333 11/03/13 1000 11/05/13 0623  WBC 11.9* 12.4* 12.0* 8.4  NEUTROABS 7.7  --   --   --   HGB 18.3* 19.7* 19.8* 18.8*  HCT 61.2* 63.9* 63.9* 60.9*  MCV 95.5 92.6 92.5 91.9  PLT 185 188 164 164   BNP: BNP (last 3 results)  Recent Labs  10/31/13 1200  PROBNP 2527.0*   SignedOren Binet  [facility] 11/05/2013, 12:02 PM

## 2013-11-05 NOTE — Clinical Social Work Note (Signed)
Patient declined to speak to CSW regarding SA Hx. Patient states that he has not used in over a month. Patient did accept treatment facility list. No other CSW needs noted. CSW signing off at this time.  Liz Beach MSW, [place], [place], [phone]

## 2013-11-05 NOTE — Progress Notes (Addendum)
Pt ambulated in the hallway with NT while on room air.  Oxygen sats 95% while resting and 92% while ambulating.  Will continue to monitor.

## 2013-11-15 ENCOUNTER — Encounter: Payer: Self-pay | Admitting: *Deleted

## 2013-11-26 ENCOUNTER — Encounter: Payer: Self-pay | Admitting: Physician Assistant

## 2013-11-26 ENCOUNTER — Ambulatory Visit (INDEPENDENT_AMBULATORY_CARE_PROVIDER_SITE_OTHER): Payer: Medicaid Other | Admitting: Physician Assistant

## 2013-11-26 VITALS — BP 100/70 | HR 95 | Ht 65.0 in | Wt 264.0 lb

## 2013-11-26 DIAGNOSIS — Z9989 Dependence on other enabling machines and devices: Secondary | ICD-10-CM

## 2013-11-26 DIAGNOSIS — F1910 Other psychoactive substance abuse, uncomplicated: Secondary | ICD-10-CM

## 2013-11-26 DIAGNOSIS — G4733 Obstructive sleep apnea (adult) (pediatric): Secondary | ICD-10-CM

## 2013-11-26 DIAGNOSIS — I2781 Cor pulmonale (chronic): Secondary | ICD-10-CM

## 2013-11-26 DIAGNOSIS — I10 Essential (primary) hypertension: Secondary | ICD-10-CM

## 2013-11-26 DIAGNOSIS — D751 Secondary polycythemia: Secondary | ICD-10-CM

## 2013-11-26 NOTE — Progress Notes (Signed)
Cardiology Office Note   Date:  11/26/2013   ID:  RUSH SALCE, DOB 1985-04-28, MRN [id number]  PCP:  Philis Fendt, MD  Cardiologist:  Dr. Kirk Ruths     History of Present Illness: LYNK MARTI is a 28 y.o. male with a history of pulmonary hypertension, OSA, obesity, substance abuse (+ cocaine, + cigs), HTN, CKD and non-adherence to medical Rx. Patient was admitted 10/8-10/13 with volume overload/anasarca secondary to right heart failure in the setting of cor pulmonale secondary to OSA, obesity/hypoventilation syndrome. He was diuresed with IV Lasix and lost 27 pounds. He was seen by cardiology. VQ scan was negative for chronic pulmonary embolism. Lower extremity Dopplers were negative for DVT. Patient had worsening creatinine with diuresis. This improved prior to discharge. Patient was noted have proteinuria. Workup was unremarkable.  He was started on ACEI and outpatient follow-up with nephrology was recommended. He was also noted to have polycythemia. He underwent therapeutic phlebotomy. Outpatient follow-up with hematology was recommended.  OP FU with pulmonary was also recommended for control of OSA.    He is here today with his sister. He admits to adherence to all his medications. He has stopped smoking cigarettes. He denies drug abuse. He has been feeling well. He denies chest pain or significant dyspnea. He has been walking daily. He is NYHA 2. He denies orthopnea, PND or edema. He denies syncope. His weights have been stable. He has a scale at home through [place].  Studies:  - Echo (10/15):  Severe LVH, EF 55-60%, moderate RVE with septal flattening suggesting cor pulmonale and significantly elevated PA pressure, mild RAE   Recent Labs/Images:  10/31/2013: ALT 31; Pro B Natriuretic peptide (BNP) 2527.0* 11/05/2013: BUN 13; Creatinine 1.05; Hemoglobin 18.8*; Potassium 4.5; Sodium 136*    Nm Pulmonary Perf And Vent  11/01/2013   IMPRESSION: 1. Negative  for pulmonary embolism. The perfusion portion of the examination is normal. 2. Small areas of air trapping noted in the anterior aspect of the upper lobes bilaterally.   Electronically Signed   By: Jacqulynn Cadet M.D.   On: 11/01/2013 14:36     Wt Readings from Last 3 Encounters:  11/26/13 264 lb (119.75 kg)  11/05/13 268 lb 4.8 oz (121.7 kg)  10/02/11 259 lb 11.2 oz (117.799 kg)     Past Medical History  Diagnosis Date  . Hypertension   . Obesity   . Tobacco abuse   . Substance abuse 8/31/213    cociane "first time use"  . High cholesterol   . CHF (congestive heart failure)   . Chronic bronchitis   . OSA on CPAP   . Arthritis     "hands, feet" (10/31/2013)  . Pneumonia 09/2011    Current Outpatient Prescriptions  Medication Sig Dispense Refill  . albuterol (PROVENTIL HFA;VENTOLIN HFA) 108 (90 BASE) MCG/ACT inhaler Inhale 1-2 puffs into the lungs every 6 (six) hours as needed for wheezing or shortness of breath. 1 Inhaler 0  . amLODipine (NORVASC) 10 MG tablet Take 1 tablet (10 mg total) by mouth daily. 30 tablet 0  . aspirin EC 325 MG EC tablet Take 1 tablet (325 mg total) by mouth daily. 30 tablet 0  . furosemide (LASIX) 40 MG tablet Take 1 tablet (40 mg total) by mouth 2 (two) times daily. 60 tablet 0  . lisinopril (PRINIVIL,ZESTRIL) 5 MG tablet Take 1 tablet (5 mg total) by mouth daily. 30 tablet 0   No current facility-administered medications for this  visit.     Allergies:   Review of patient's allergies indicates no known allergies.   Social History:  The patient  reports that he has been smoking Cigarettes.  He has a 4.5 pack-year smoking history. He has never used smokeless tobacco. He reports that he drinks about 0.6 oz of alcohol per week. He reports that he uses illicit drugs (Cocaine).   Family History:  The patient's family history includes Cancer in his paternal grandfather; Heart disease in his mother; Hypertension in his father. There is no history of Heart  attack or Stroke.   ROS:  Please see the history of present illness.       All other systems reviewed and negative.    PHYSICAL EXAM: VS:  BP 100/70 mmHg  Pulse 95  Ht 5\' 5"  (1.651 m)  Wt 264 lb (119.75 kg)  BMI 43.93 kg/m2 Well nourished, well developed, in no acute distress HEENT: normal Neck:  no JVD Cardiac:  normal S1, S2;  RRR; no murmur Lungs:   clear to auscultation bilaterally, no wheezing, rhonchi or rales Abd: soft, nontender, no hepatomegaly Ext:  no edema Skin: warm and dry Neuro:  CNs 2-12 intact, no focal abnormalities noted  EKG:  NSR, right axis deviation, nonspecific ST-T wave changes      ASSESSMENT AND PLAN:  1.  Cor pulmonale:  Volume remains stable. His primary care physician has recently checked a BMET. I will obtain his results. Continue current dose of Lasix. 2.  OSA (obstructive sleep apnea):  He has an element of obesity hypoventilation syndrome. He has pulmonary hypertension in the setting of cor pulmonale and sleep apnea.      -  Referred to pulmonology.     -  Continue CPAP. 3.  Essential hypertension:  Controlled. 4.  Polycythemia secondary to hypoxia:  Follow-up with primary care. Consider referral to hematology. 5.  Substance abuse:  He admits to cessation. 6.  Proteinuria:  Follow-up with primary care. Consider follow-up with nephrology.  Disposition:   FU with me in one month   Signed, Versie Starks, MHS 11/26/2013 4:09 PM    [facility] [place] [place], [place], [place]  [address] Phone: [phone]; Fax: [phone]

## 2013-11-26 NOTE — Patient Instructions (Signed)
Your physician recommends that you continue on your current medications as directed. Please refer to the Current Medication list given to you today.  Please have your pcp draw your lab and the the results to our office. Please take the written script you were given today with you  You have been referred to Pulmonology [place] or [place]  Your physician recommends that you schedule a follow-up appointment in: 1 month with [facility]

## 2013-12-27 ENCOUNTER — Ambulatory Visit (INDEPENDENT_AMBULATORY_CARE_PROVIDER_SITE_OTHER): Payer: Medicaid Other | Admitting: Physician Assistant

## 2013-12-27 ENCOUNTER — Encounter: Payer: Self-pay | Admitting: Physician Assistant

## 2013-12-27 VITALS — BP 148/102 | HR 99 | Ht 65.0 in | Wt 288.8 lb

## 2013-12-27 DIAGNOSIS — G4730 Sleep apnea, unspecified: Secondary | ICD-10-CM

## 2013-12-27 DIAGNOSIS — N189 Chronic kidney disease, unspecified: Secondary | ICD-10-CM

## 2013-12-27 DIAGNOSIS — F1910 Other psychoactive substance abuse, uncomplicated: Secondary | ICD-10-CM

## 2013-12-27 DIAGNOSIS — D751 Secondary polycythemia: Secondary | ICD-10-CM

## 2013-12-27 DIAGNOSIS — I10 Essential (primary) hypertension: Secondary | ICD-10-CM

## 2013-12-27 DIAGNOSIS — I2781 Cor pulmonale (chronic): Secondary | ICD-10-CM

## 2013-12-27 MED ORDER — FUROSEMIDE 40 MG PO TABS: 40.0000 mg | ORAL_TABLET | Freq: Two times a day (BID) | ORAL | Status: DC

## 2013-12-27 NOTE — Patient Instructions (Addendum)
LAB WORK TODAY; BMET, CBC  REPEAT BMET IN 1 WEEK  [facility] WAS CALLED TO HAVE A NEW RX FILLED FOR THE LASIX 40 MG TWICE DAILY PER SCOTT WEAVER, [facility]  Your physician recommends that you schedule a follow-up appointment in: [place]

## 2013-12-27 NOTE — Progress Notes (Addendum)
Cardiology Office Note   Date:  12/27/2013   ID:  Douglas [name], DOB May 04, 1985, MRN [id number]  PCP:  Philis Fendt, MD  Cardiologist:  Dr. Kirk Ruths     History of Present Illness: Douglas [name] is a 28 y.o. male with a history of pulmonary hypertension, OSA, obesity, substance abuse (+ cocaine, + cigs), HTN, CKD and non-adherence to medical Rx. Patient was admitted 10/2013 with volume overload/anasarca secondary to right heart failure in the setting of cor pulmonale secondary to OSA, obesity/hypoventilation syndrome. He was diuresed with IV Lasix and lost 27 pounds.  VQ scan was negative for chronic pulmonary embolism. Lower extremity Dopplers were negative for DVT.  Patient was noted have proteinuria. Workup was unremarkable.  He was started on ACEI and outpatient follow-up with nephrology was recommended. He was also noted to have polycythemia. He underwent therapeutic phlebotomy. Outpatient follow-up with hematology was recommended.  OP FU with pulmonary was also recommended for control of OSA.    I saw him 11/3 in FU. He was referred to Pulmonary.    He returns for FU.  He has not been referred to nephrology or hematology. He ran out of his medications about 10-15 days ago. He has not taken anything yet today. He did get his medications refilled and started back on them yesterday. He does tell me that he feels better after resuming his medications last night. His weight is up about 20 pounds. He has not been able to get his BiPAP repaired. He sees pulmonology in 2 weeks. He denies chest pain or significant dyspnea. He is NYHA 2. He sleeps on 2 pillows chronically. He denies LE edema. He denies syncope. He denies cough.  Studies:  - Echo (10/15):  Severe LVH, EF 55-60%, moderate RVE with septal flattening suggesting cor pulmonale and significantly elevated PA pressure, mild RAE   Recent Labs/Images:  10/31/2013: ALT 31; Pro B Natriuretic peptide (BNP)  2527.0* 11/05/2013: BUN 13; Creatinine 1.05; Hemoglobin 18.8*; Potassium 4.5; Sodium 136*    Nm Pulmonary Perf And Vent  11/01/2013   IMPRESSION: 1. Negative for pulmonary embolism. The perfusion portion of the examination is normal. 2. Small areas of air trapping noted in the anterior aspect of the upper lobes bilaterally.   Electronically Signed   By: Jacqulynn Cadet M.D.   On: 11/01/2013 14:36     Wt Readings from Last 3 Encounters:  12/27/13 288 lb 12.8 oz (130.999 kg)  11/26/13 264 lb (119.75 kg)  11/05/13 268 lb 4.8 oz (121.7 kg)     Past Medical History  Diagnosis Date  . Hypertension   . Obesity   . Tobacco abuse   . Substance abuse 8/31/213    cociane "first time use"  . High cholesterol   . CHF (congestive heart failure)   . Chronic bronchitis   . OSA on CPAP   . Arthritis     "hands, feet" (10/31/2013)  . Pneumonia 09/2011    Current Outpatient Prescriptions  Medication Sig Dispense Refill  . albuterol (PROVENTIL HFA;VENTOLIN HFA) 108 (90 BASE) MCG/ACT inhaler Inhale 1-2 puffs into the lungs every 6 (six) hours as needed for wheezing or shortness of breath. 1 Inhaler 0  . amLODipine (NORVASC) 10 MG tablet Take 1 tablet (10 mg total) by mouth daily. 30 tablet 0  . aspirin EC 325 MG EC tablet Take 1 tablet (325 mg total) by mouth daily. 30 tablet 0  . furosemide (LASIX) 40 MG tablet Take 1 tablet (40  mg total) by mouth 2 (two) times daily. 60 tablet 0  . lisinopril (PRINIVIL,ZESTRIL) 5 MG tablet Take 1 tablet (5 mg total) by mouth daily. 30 tablet 0   No current facility-administered medications for this visit.     Allergies:   Review of patient's allergies indicates no known allergies.   Social History:  The patient  reports that he quit smoking about 8 weeks ago. His smoking use included Cigarettes. He has a 4.5 pack-year smoking history. He has never used smokeless tobacco. He reports that he does not drink alcohol or use illicit drugs.   Family History:  The  patient's family history includes Cancer in his paternal grandfather; Heart disease in his mother; Hypertension in his father. There is no history of Heart attack or Stroke.   ROS:  Please see the history of present illness.        All other systems reviewed and negative.    PHYSICAL EXAM: VS:  BP 148/102 mmHg  Pulse 99  Ht 5\' 5"  (1.651 m)  Wt 288 lb 12.8 oz (130.999 kg)  BMI 48.06 kg/m2 Well nourished, well developed, in no acute distress HEENT: normal Neck:  I cannot appreciate JVD Cardiac:  normal S1, S2;  RRR; no murmur Lungs:    Decreased breath sounds bilaterally, no wheezing, rhonchi or rales Abd: soft, nontender, no hepatomegaly Ext:  no edema Skin: warm and dry Neuro:  CNs 2-12 intact, no focal abnormalities noted  EKG:  NSR, HR 99, right axis deviation, nonspecific ST-T wave changes      ASSESSMENT AND PLAN:  1.  Cor pulmonale:  He ran out of his medications for about 2 weeks. His weight is up 20 pounds. However, he does not report significant dyspnea or LE edema. He does feel better on Lasix 40 mg twice a day. However, his prescription was refilled yesterday for 40 mg once a day.    -  Change Lasix back to 40 mg twice a day.    -  Check BMET today. Repeat in 1 week. 2.  OSA (obstructive sleep apnea):  Follow-up with pulmonology as planned. 3.  Essential hypertension:  Uncontrolled.  Medications were just resumed yesterday.    -  Continue current Rx.  Plan early FU.  4.  Polycythemia secondary to hypoxia:      -  Check CBC today.    -  Consider referral to hematology. 5.  Substance abuse:   He admits to cessation. 6.  Proteinuria:  Check BMET today.  Consider referral to Nephrology.  Disposition:   FU me in 2 weeks.    Signed, Versie Starks, MHS 12/27/2013 11:52 AM    [facility] [place] [place], [place], [place]  [address] Phone: [phone]; Fax: [phone]

## 2013-12-29 LAB — CBC
Hemoglobin: 17.7 g/dL — ABNORMAL HIGH (ref 13.0–17.0)
MCHC: 29.4 g/dL — ABNORMAL LOW (ref 30.0–36.0)
MCV: 90.7 fl (ref 78.0–100.0)
Platelets: 180 10*3/uL (ref 150.0–400.0)
RBC: 6.65 Mil/uL — ABNORMAL HIGH (ref 4.22–5.81)
RDW: 16 % — AB (ref 11.5–15.5)
WBC: 6.9 10*3/uL (ref 4.0–10.5)

## 2013-12-29 LAB — BASIC METABOLIC PANEL
BUN: 13 mg/dL (ref 6–23)
CHLORIDE: 97 meq/L (ref 96–112)
CO2: 35 meq/L — AB (ref 19–32)
CREATININE: 1.1 mg/dL (ref 0.4–1.5)
Calcium: 8.9 mg/dL (ref 8.4–10.5)
GFR: 98.79 mL/min (ref >60.00)
Glucose, Bld: 84 mg/dL (ref 70–99)
Potassium: 4.6 mEq/L (ref 3.5–5.1)
Sodium: 140 mEq/L (ref 135–145)

## 2013-12-30 ENCOUNTER — Telehealth: Payer: Self-pay | Admitting: Physician Assistant

## 2013-12-30 NOTE — Telephone Encounter (Signed)
New problem   Pt returning call. Please call pt.

## 2013-12-30 NOTE — Telephone Encounter (Signed)
Patient called with lab results.

## 2014-01-09 ENCOUNTER — Encounter: Payer: Self-pay | Admitting: Pulmonary Disease

## 2014-01-09 ENCOUNTER — Ambulatory Visit (INDEPENDENT_AMBULATORY_CARE_PROVIDER_SITE_OTHER): Payer: Medicaid Other | Admitting: Pulmonary Disease

## 2014-01-09 VITALS — BP 154/82 | HR 97 | Temp 98.8°F | Ht 65.0 in | Wt 291.4 lb

## 2014-01-09 DIAGNOSIS — Z720 Tobacco use: Secondary | ICD-10-CM

## 2014-01-09 DIAGNOSIS — G4733 Obstructive sleep apnea (adult) (pediatric): Secondary | ICD-10-CM

## 2014-01-09 DIAGNOSIS — E662 Morbid (severe) obesity with alveolar hypoventilation: Secondary | ICD-10-CM

## 2014-01-09 NOTE — Progress Notes (Signed)
Chief Complaint  Patient presents with  . Follow-up    Referred by Dr Kathlen Mody. Sleep Study 2011. Has CPAP(not using-broken) and wears 1-2L qhs. Pt arrived on RA with sats at 76%. Placed on 4L O2- recovered to 97%. Pt maintained at 94% on 3L. Epworth Score: 20    History of Present Illness: Douglas [name] is a 28 y.o. male for evaluation of sleep problems.  He has history of severe OSA.  He has been using CPAP but his machine broke.  He also uses oxygen at night.  He was seen recently by cardiology for cor pulmonale.  He was advised to have further pulmonary assessment.  On arrival to office today his oxygen level was 76% at rest on room air.  This improved with addition of 4 liters oxygen.  W/o CPAP his sleep is very disrupted.  He snores, and wakes up because he stops breathing.  He gets dry mouth, and has to use the bathroom frequently at night.  He gets occasional cough and clear sputum.  He denies chest pain.  He gets swelling in his legs.  His breathing and sleep were better when he lost weight, but got worse once he regained weight.  He goes to sleep at 10 pm.  He falls asleep quickly.  He wakes up several times to use the bathroom.  He gets out of bed at noon.  He feels tired in the morning.  He gets morning headache.  He does not use anything to help him fall sleep or stay awake.  He denies sleep walking, sleep talking, bruxism, or nightmares.  There is no history of restless legs.  He denies sleep hallucinations, sleep paralysis, or cataplexy.  The Epworth score is 20 out of 24.  Tests: PSG 08/14/09 >> AHI 105.4, SaO2 low 56%, BiPAP 21/17 cm H2O Echo 11/01/13 >> severe LVH, EF 55 to 60%, mod RA/RV dilation V/Q scan 11/01/13 >> upper lobe airtrapping b/l  PMHx >> HTN, CKD, Substance abuse, HLD  Corbitt D Bacigalupi  has past surgical history that includes No past surgeries.  Prior to Admission medications   Medication Sig Start Date End Date Taking? Authorizing Provider   albuterol (PROVENTIL HFA;VENTOLIN HFA) 108 (90 BASE) MCG/ACT inhaler Inhale 1-2 puffs into the lungs every 6 (six) hours as needed for wheezing or shortness of breath. 11/05/13  Yes Shanker Kristeen Mans, MD  amLODipine (NORVASC) 10 MG tablet Take 1 tablet (10 mg total) by mouth daily. 11/05/13  Yes Shanker Kristeen Mans, MD  aspirin EC 325 MG EC tablet Take 1 tablet (325 mg total) by mouth daily. 11/05/13  Yes Shanker Kristeen Mans, MD  furosemide (LASIX) 40 MG tablet Take 1 tablet (40 mg total) by mouth 2 (two) times daily. 12/27/13  Yes Scott T Kathlen Mody, PA-C  lisinopril (PRINIVIL,ZESTRIL) 5 MG tablet Take 1 tablet (5 mg total) by mouth daily. 11/05/13  Yes Shanker Kristeen Mans, MD    No Known Allergies  His family history includes Cancer in his paternal grandfather; Heart disease in his mother; Hypertension in his father. There is no history of Heart attack or Stroke.  He  reports that he quit smoking about 2 months ago. His smoking use included Cigarettes. He has a 4.5 pack-year smoking history. He has never used smokeless tobacco. He reports that he uses illicit drugs (Cocaine). He reports that he does not drink alcohol.   Review of Systems  Constitutional: Positive for unexpected weight change. Negative for fever.  HENT: Negative for  congestion, dental problem, ear pain, nosebleeds, postnasal drip, rhinorrhea, sinus pressure, sneezing, sore throat and trouble swallowing.   Eyes: Negative for redness and itching.  Respiratory: Negative for cough, chest tightness, shortness of breath and wheezing.   Cardiovascular: Negative for palpitations and leg swelling.  Gastrointestinal: Negative for nausea and vomiting.  Genitourinary: Negative for dysuria.  Musculoskeletal: Negative for joint swelling.  Skin: Positive for rash ( itching).  Neurological: Negative for headaches.  Hematological: Does not bruise/bleed easily.  Psychiatric/Behavioral: Negative for dysphoric mood. The patient is not nervous/anxious.      Physical Exam: Blood pressure 154/82, pulse 97, temperature 98.8 F (37.1 C), temperature source Oral, height 5\' 5"  (1.651 m), weight 291 lb 6.4 oz (132.178 kg), SpO2 94 %. Body mass index is 48.49 kg/(m^2).  General - No distress ENT - No sinus tenderness, no oral exudate, no LAN, no thyromegaly, TM clear, pupils equal/reactive, MP 4 Cardiac - s1s2 regular, no murmur, pulses symmetric Chest - No wheeze/rales/dullness, good air entry, normal respiratory excursion Back - No focal tenderness Abd - Soft, non-tender, no organomegaly, + bowel sounds Ext - 1+ edema Neuro - Normal strength, cranial nerves intact Skin - No rashes Psych - Normal mood, and behavior   CMP Latest Ref Rng 12/27/2013 11/05/2013 11/04/2013  Glucose 70 - 99 mg/dL 84 85 86  BUN 6 - 23 mg/dL 13 13 13   Creatinine 0.4 - 1.5 mg/dL 1.1 1.05 1.24  Sodium 135 - 145 mEq/L 140 136(L) 138  Potassium 3.5 - 5.1 mEq/L 4.6 4.5 4.1  Chloride 96 - 112 mEq/L 97 91(L) 90(L)  CO2 19 - 32 mEq/L 35(H) 33(H) 41(HH)  Calcium 8.4 - 10.5 mg/dL 8.9 8.9 8.7  Total Protein 6.0 - 8.3 g/dL - - -  Total Bilirubin 0.3 - 1.2 mg/dL - - -  Alkaline Phos 39 - 117 U/L - - -  AST 0 - 37 U/L - - -  ALT 0 - 53 U/L - - -    CBC Latest Ref Rng 12/27/2013 11/05/2013 11/03/2013  WBC 4.0 - 10.5 K/uL 6.9 8.4 12.0(H)  Hemoglobin 13.0 - 17.0 g/dL 17.7(H) 18.8(H) 19.8(H)  Hematocrit 39.0 - 52.0 % 59.8 Repeated and verified X2.(H) 60.9(H) 63.9(H)  Platelets 150.0 - 400.0 K/uL 180.0 164 164    BNP (last 3 results)  Recent Labs  10/31/13 1200  PROBNP 2527.0*    ABG    Component Value Date/Time   PHART 7.306* 09/27/2011 2259   PCO2ART 75.7* 09/27/2011 2259   PO2ART 69.0* 09/27/2011 2259   HCO3 37.8* 09/27/2011 2259   TCO2 40 09/27/2011 2259   O2SAT 91.0 09/27/2011 2259      Discussion: He is morbidly obese.  He has hx of severe sleep apnea.  He has daytime hypoxemia, previous blood gas showed elevated PaCO2, polycythemia and recent BMET  showed elevated HCO3.  He has history of tobacco abuse and intermittent cocaine use.  He has cor pulmonale likely related to OSA/OHS, and possible obstructive lung disease.  Assessment/plan:  Obstructive sleep apnea. Plan: - will arrange for CPAP titration study, and have him start study using supplemental oxygen.   - transition to BiPAP if needed  Obesity hypoventilation syndrome. Plan: - he will need to use 4 liters supplemental oxygen 24/7 for now  Hx of tobacco abuse. Plan: - will arrange for PFT's to assess for obstructive lung disease  Morbid obesity. Discussed how his weight is affecting his breathing and his health. Plan: - discussed options to assist with weight  loss  Cocaine abuse. Plan: - explained the absolute need to not using illicit substances   [name], M.D. Pager [phone]

## 2014-01-09 NOTE — Progress Notes (Deleted)
   Subjective:    Patient ID: Douglas [name], male    DOB: [birth year], 28 y.o.   MRN: [id number]  HPI    Review of Systems  Constitutional: Positive for unexpected weight change. Negative for fever.  HENT: Negative for congestion, dental problem, ear pain, nosebleeds, postnasal drip, rhinorrhea, sinus pressure, sneezing, sore throat and trouble swallowing.   Eyes: Negative for redness and itching.  Respiratory: Negative for cough, chest tightness, shortness of breath and wheezing.   Cardiovascular: Negative for palpitations and leg swelling.  Gastrointestinal: Negative for nausea and vomiting.  Genitourinary: Negative for dysuria.  Musculoskeletal: Negative for joint swelling.  Skin: Positive for rash ( itching).  Neurological: Negative for headaches.  Hematological: Does not bruise/bleed easily.  Psychiatric/Behavioral: Negative for dysphoric mood. The patient is not nervous/anxious.        Objective:   Physical Exam        Assessment & Plan:

## 2014-01-09 NOTE — Patient Instructions (Signed)
Use 4 liters oxygen 24 hours per day Will arrange CPAP titration sleep study Will arrange for breathing test (PFT) Follow up in 3 months

## 2014-01-27 ENCOUNTER — Telehealth: Payer: Self-pay

## 2014-01-27 ENCOUNTER — Telehealth: Payer: Self-pay | Admitting: *Deleted

## 2014-01-27 ENCOUNTER — Emergency Department (HOSPITAL_COMMUNITY): Payer: Medicaid Other

## 2014-01-27 ENCOUNTER — Encounter (HOSPITAL_COMMUNITY): Payer: Self-pay | Admitting: Cardiology

## 2014-01-27 ENCOUNTER — Emergency Department (HOSPITAL_COMMUNITY)
Admission: EM | Admit: 2014-01-27 | Discharge: 2014-01-27 | Disposition: A | Discharge status: Home / Self Care | Payer: Medicaid Other | Attending: Emergency Medicine | Admitting: Emergency Medicine

## 2014-01-27 DIAGNOSIS — M19041 Primary osteoarthritis, right hand: Secondary | ICD-10-CM | POA: Insufficient documentation

## 2014-01-27 DIAGNOSIS — Z79899 Other long term (current) drug therapy: Secondary | ICD-10-CM | POA: Diagnosis not present

## 2014-01-27 DIAGNOSIS — G4733 Obstructive sleep apnea (adult) (pediatric): Secondary | ICD-10-CM | POA: Insufficient documentation

## 2014-01-27 DIAGNOSIS — Z8639 Personal history of other endocrine, nutritional and metabolic disease: Secondary | ICD-10-CM | POA: Insufficient documentation

## 2014-01-27 DIAGNOSIS — E669 Obesity, unspecified: Secondary | ICD-10-CM | POA: Diagnosis not present

## 2014-01-27 DIAGNOSIS — R609 Edema, unspecified: Secondary | ICD-10-CM | POA: Insufficient documentation

## 2014-01-27 DIAGNOSIS — Z8709 Personal history of other diseases of the respiratory system: Secondary | ICD-10-CM | POA: Diagnosis not present

## 2014-01-27 DIAGNOSIS — R109 Unspecified abdominal pain: Secondary | ICD-10-CM | POA: Diagnosis present

## 2014-01-27 DIAGNOSIS — Z9981 Dependence on supplemental oxygen: Secondary | ICD-10-CM | POA: Insufficient documentation

## 2014-01-27 DIAGNOSIS — M19079 Primary osteoarthritis, unspecified ankle and foot: Secondary | ICD-10-CM | POA: Diagnosis not present

## 2014-01-27 DIAGNOSIS — I509 Heart failure, unspecified: Secondary | ICD-10-CM | POA: Diagnosis not present

## 2014-01-27 DIAGNOSIS — I2781 Cor pulmonale (chronic): Secondary | ICD-10-CM | POA: Diagnosis not present

## 2014-01-27 DIAGNOSIS — Z7982 Long term (current) use of aspirin: Secondary | ICD-10-CM | POA: Insufficient documentation

## 2014-01-27 DIAGNOSIS — I10 Essential (primary) hypertension: Secondary | ICD-10-CM | POA: Diagnosis not present

## 2014-01-27 DIAGNOSIS — Z87891 Personal history of nicotine dependence: Secondary | ICD-10-CM | POA: Diagnosis not present

## 2014-01-27 DIAGNOSIS — R0602 Shortness of breath: Secondary | ICD-10-CM | POA: Insufficient documentation

## 2014-01-27 DIAGNOSIS — Z8701 Personal history of pneumonia (recurrent): Secondary | ICD-10-CM | POA: Insufficient documentation

## 2014-01-27 DIAGNOSIS — M19042 Primary osteoarthritis, left hand: Secondary | ICD-10-CM | POA: Insufficient documentation

## 2014-01-27 LAB — CBC
HEMATOCRIT: 61.6 % — AB (ref 39.0–52.0)
Hemoglobin: 17.8 g/dL — ABNORMAL HIGH (ref 13.0–17.0)
MCH: 25.8 pg — AB (ref 26.0–34.0)
MCHC: 28.9 g/dL — ABNORMAL LOW (ref 30.0–36.0)
MCV: 89.3 fL (ref 78.0–100.0)
Platelets: 140 10*3/uL — ABNORMAL LOW (ref 150–400)
RBC: 6.9 MIL/uL — ABNORMAL HIGH (ref 4.22–5.81)
RDW: 15.9 % — ABNORMAL HIGH (ref 11.5–15.5)
WBC: 8.8 10*3/uL (ref 4.0–10.5)

## 2014-01-27 LAB — BASIC METABOLIC PANEL
Anion gap: 7 (ref 5–15)
BUN: 12 mg/dL (ref 6–23)
CO2: 40 mmol/L (ref 19–32)
Calcium: 8.5 mg/dL (ref 8.4–10.5)
Chloride: 92 mEq/L — ABNORMAL LOW (ref 96–112)
Creatinine, Ser: 1.11 mg/dL (ref 0.50–1.35)
GFR, EST NON AFRICAN AMERICAN: 88 mL/min — AB (ref >90)
GLUCOSE: 68 mg/dL — AB (ref 70–99)
Potassium: 4.3 mmol/L (ref 3.5–5.1)
SODIUM: 139 mmol/L (ref 135–145)

## 2014-01-27 LAB — BRAIN NATRIURETIC PEPTIDE: B NATRIURETIC PEPTIDE 5: 290.1 pg/mL — AB (ref 0.0–100.0)

## 2014-01-27 MED ORDER — FUROSEMIDE 10 MG/ML IJ SOLN
40.0000 mg | Freq: Once | INTRAMUSCULAR | Status: AC
  Administered 2014-01-27: 40 mg via INTRAVENOUS
  Filled 2014-01-27: qty 4

## 2014-01-27 MED ORDER — FUROSEMIDE 40 MG PO TABS: 40.0000 mg | ORAL_TABLET | Freq: Two times a day (BID) | ORAL | Status: DC

## 2014-01-27 NOTE — Telephone Encounter (Signed)
This patient walked in the office today, with symptoms of abdominal swelling and feeling "tight" in abdomen. Was here 12/04 to see Richardson Dopp. Lasix was increased to 40 mg bid, however patient is only taking one daily. Slight Sob, no lower extrem edema. Patient does not know his weight. BP 120/96, pulse 76. Per DOD Dr. Meda Coffee, he can either increase Lasix to 40 mg bid, or go directly to ED for evaluation. He has chosen to go the ED.

## 2014-01-27 NOTE — Telephone Encounter (Signed)
Pt came in office to drop off our oxygen tank.  Also c/o swelling in abdomen for past 2 weeks.  Denies increase in sob.  No swelling noted in feet and legs.  Pt states he is wearing oxygen most all the time.  Still not wearing cpap until he has cpap titration until 02/05/14.  Advised pt that Dr Halford Chessman is not in office until 02/05/14 and we could work him in to see another physician.  Pt states he is going to see if his cardiologist will see him today.  Advised pt to call PCP office.  Pt verbalized understanding.

## 2014-01-27 NOTE — ED Notes (Signed)
Pt reports lower abd swelling that started a couple of days ago. Pt with some SOB also. Pt placed on 3L [place] at triage. Sats increased to 96%.

## 2014-01-27 NOTE — ED Provider Notes (Signed)
CSN: [id number]     Arrival date & time 01/27/14  1230 History   First MD Initiated Contact with Patient 01/27/14 1244     Chief Complaint  Patient presents with  . Abdominal Pain     (Consider location/radiation/quality/duration/timing/severity/associated sxs/prior Treatment) Patient is a 29 y.o. male presenting with abdominal pain and shortness of breath. The history is provided by the patient.  Abdominal Pain Associated symptoms: shortness of breath   Associated symptoms: no chest pain, no chills, no cough, no fever and no vomiting   Shortness of Breath Severity:  Mild Onset quality:  Gradual Timing:  Constant Progression:  Worsening Chronicity:  Recurrent Context: not URI   Relieved by:  Nothing Worsened by:  Nothing tried Associated symptoms: no abdominal pain, no chest pain, no cough, no fever and no vomiting     Past Medical History  Diagnosis Date  . Hypertension   . Obesity   . Tobacco abuse   . Substance abuse 8/31/213    cociane "first time use"  . High cholesterol   . CHF (congestive heart failure)   . Chronic bronchitis   . OSA on CPAP   . Arthritis     "hands, feet" (10/31/2013)  . Pneumonia 09/2011   Past Surgical History  Procedure Laterality Date  . No past surgeries     Family History  Problem Relation Age of Onset  . Heart disease Mother     Pacemaker  . Heart attack Neg Hx   . Stroke Neg Hx   . Hypertension Father   . Cancer Paternal Grandfather    History  Substance Use Topics  . Smoking status: Former Smoker -- 0.50 packs/day for 9 years    Types: Cigarettes    Quit date: 10/31/2013  . Smokeless tobacco: Never Used  . Alcohol Use: No    Review of Systems  Constitutional: Negative for fever and chills.  Respiratory: Positive for shortness of breath. Negative for cough.   Cardiovascular: Negative for chest pain and leg swelling.  Gastrointestinal: Negative for vomiting and abdominal pain.  All other systems reviewed and are  negative.     Allergies  Review of patient's allergies indicates no known allergies.  Home Medications   Prior to Admission medications   Medication Sig Start Date End Date Taking? Authorizing Provider  albuterol (PROVENTIL HFA;VENTOLIN HFA) 108 (90 BASE) MCG/ACT inhaler Inhale 1-2 puffs into the lungs every 6 (six) hours as needed for wheezing or shortness of breath. 11/05/13   Shanker Kristeen Mans, MD  amLODipine (NORVASC) 10 MG tablet Take 1 tablet (10 mg total) by mouth daily. 11/05/13   Shanker Kristeen Mans, MD  aspirin EC 325 MG EC tablet Take 1 tablet (325 mg total) by mouth daily. 11/05/13   Shanker Kristeen Mans, MD  furosemide (LASIX) 40 MG tablet Take 1 tablet (40 mg total) by mouth 2 (two) times daily. 12/27/13   Liliane Shi, PA-C  lisinopril (PRINIVIL,ZESTRIL) 5 MG tablet Take 1 tablet (5 mg total) by mouth daily. 11/05/13   Shanker Kristeen Mans, MD   BP 131/93 mmHg  Pulse 87  Temp(Src) 98.5 F (36.9 C) (Oral)  Resp 14  SpO2 99% Physical Exam  Constitutional: He is oriented to person, place, and time. He appears well-developed and well-nourished. No distress.  HENT:  Head: Normocephalic and atraumatic.  Mouth/Throat: No oropharyngeal exudate.  Eyes: EOM are normal. Pupils are equal, round, and reactive to light.  Neck: Normal range of motion. Neck supple.  Cardiovascular:  Normal rate and regular rhythm.  Exam reveals no friction rub.   No murmur heard. Pulmonary/Chest: Effort normal and breath sounds normal. No respiratory distress. He has no wheezes. He has no rales.  Abdominal: He exhibits no distension. There is no tenderness. There is no rebound.  Musculoskeletal: Normal range of motion. He exhibits edema. Tenderness: 1+ pitting.  Neurological: He is alert and oriented to person, place, and time. No cranial nerve deficit. He exhibits normal muscle tone.  Skin: No rash noted. He is not diaphoretic.  Nursing note and vitals reviewed.   ED Course  Procedures (including  critical care time) Labs Review Labs Reviewed  CBC - Abnormal; Notable for the following:    RBC 6.90 (*)    Hemoglobin 17.8 (*)    HCT 61.6 (*)    MCH 25.8 (*)    MCHC 28.9 (*)    RDW 15.9 (*)    Platelets 140 (*)    All other components within normal limits  BASIC METABOLIC PANEL - Abnormal; Notable for the following:    Chloride 92 (*)    CO2 40 (*)    Glucose, Bld 68 (*)    GFR calc non Af Amer 88 (*)    All other components within normal limits  BRAIN NATRIURETIC PEPTIDE - Abnormal; Notable for the following:    B Natriuretic Peptide 290.1 (*)    All other components within normal limits    Imaging Review Dg Chest 2 View  01/27/2014   CLINICAL DATA:  Shortness of breath.  EXAM: CHEST  2 VIEW  COMPARISON:  PA and lateral chest 10/31/2013.  CT chest 09/27/2011.  FINDINGS: There is cardiomegaly without pulmonary edema. The lungs are clear. No pneumothorax or pleural effusion.  IMPRESSION: Cardiomegaly without acute disease.   Electronically Signed   By: Inge Rise M.D.   On: 01/27/2014 13:45     EKG Interpretation None      MDM   Final diagnoses:  Shortness of breath  Cor pulmonale    38M here today with leg and abdominal swelling. Hx of cor pulmonale, on 2 L of oxygen chronically. Noticed worsening swelling in legs and abdomen so went to his PCP's office. PCP wasn't in the clinic, so he presented to the ED. Noted to be hypoxic here, but that was on room air. Wears O2 all the time. States recently normal 2L of oxygen hasn't felt like enough, so he's turned it to 3 or 4 which helped. Also one month ago was supposed to increase his lasix to BID, but that wasn't written on the Rx bottle, so he has still only been taking one at a time. BNP ok. CXR shows cardiomegaly, but no effusions. O2 sats ok here.  Patient appears well, stable vitals and normal CXR for him. Given 40 mg of lasix and instructed to take BID lasix. Stable for discharge.  Evelina Bucy, MD 01/27/14  [phone]

## 2014-01-27 NOTE — ED Notes (Signed)
Lab called w/critical value, co2 of 40,.  Dr Mingo Amber notified

## 2014-01-27 NOTE — Discharge Instructions (Signed)
Please take your Lasix twice daily. Please continue to wear your oxygen therapy.  Shortness of Breath Shortness of breath means you have trouble breathing. It could also mean that you have a medical problem. You should get immediate medical care for shortness of breath. CAUSES   Not enough oxygen in the air such as with high altitudes or a smoke-filled room.  Certain lung diseases, infections, or problems.  Heart disease or conditions, such as angina or heart failure.  Low red blood cells (anemia).  Poor physical fitness, which can cause shortness of breath when you exercise.  Chest or back injuries or stiffness.  Being overweight.  Smoking.  Anxiety, which can make you feel like you are not getting enough air. DIAGNOSIS  Serious medical problems can often be found during your physical exam. Tests may also be done to determine why you are having shortness of breath. Tests may include:  Chest X-rays.  Lung function tests.  Blood tests.  An electrocardiogram (ECG).  An ambulatory electrocardiogram. An ambulatory ECG records your heartbeat patterns over a 24-hour period.  Exercise testing.  A transthoracic echocardiogram (TTE). During echocardiography, sound waves are used to evaluate how blood flows through your heart.  A transesophageal echocardiogram (TEE).  Imaging scans. Your health care provider may not be able to find a cause for your shortness of breath after your exam. In this case, it is important to have a follow-up exam with your health care provider as directed.  TREATMENT  Treatment for shortness of breath depends on the cause of your symptoms and can vary greatly. HOME CARE INSTRUCTIONS   Do not smoke. Smoking is a common cause of shortness of breath. If you smoke, ask for help to quit.  Avoid being around chemicals or things that may bother your breathing, such as paint fumes and dust.  Rest as needed. Slowly resume your usual activities.  If  medicines were prescribed, take them as directed for the full length of time directed. This includes oxygen and any inhaled medicines.  Keep all follow-up appointments as directed by your health care provider. SEEK MEDICAL CARE IF:   Your condition does not improve in the time expected.  You have a hard time doing your normal activities even with rest.  You have any new symptoms. SEEK IMMEDIATE MEDICAL CARE IF:   Your shortness of breath gets worse.  You feel light-headed, faint, or develop a cough not controlled with medicines.  You start coughing up blood.  You have pain with breathing.  You have chest pain or pain in your arms, shoulders, or abdomen.  You have a fever.  You are unable to walk up stairs or exercise the way you normally do. MAKE SURE YOU:  Understand these instructions.  Will watch your condition.  Will get help right away if you are not doing well or get worse. Document Released: 10/05/2000 Document Revised: 01/15/2013 Document Reviewed: 03/28/2011 [facility] Patient Information 2015 [place], [place]. This information is not intended to replace advice given to you by your health care provider. Make sure you discuss any questions you have with your health care provider.

## 2014-02-05 ENCOUNTER — Ambulatory Visit (HOSPITAL_BASED_OUTPATIENT_CLINIC_OR_DEPARTMENT_OTHER): Payer: Medicaid Other | Attending: Internal Medicine | Admitting: Radiology

## 2014-02-05 VITALS — Ht 65.0 in | Wt 270.0 lb

## 2014-02-05 DIAGNOSIS — Z720 Tobacco use: Secondary | ICD-10-CM | POA: Insufficient documentation

## 2014-02-05 DIAGNOSIS — E662 Morbid (severe) obesity with alveolar hypoventilation: Secondary | ICD-10-CM | POA: Insufficient documentation

## 2014-02-05 DIAGNOSIS — G4733 Obstructive sleep apnea (adult) (pediatric): Secondary | ICD-10-CM | POA: Diagnosis not present

## 2014-02-09 ENCOUNTER — Telehealth: Payer: Self-pay | Admitting: Pulmonary Disease

## 2014-02-09 ENCOUNTER — Ambulatory Visit (HOSPITAL_BASED_OUTPATIENT_CLINIC_OR_DEPARTMENT_OTHER): Payer: Medicaid Other | Admitting: Pulmonary Disease

## 2014-02-09 DIAGNOSIS — Z9989 Dependence on other enabling machines and devices: Principal | ICD-10-CM

## 2014-02-09 DIAGNOSIS — G4733 Obstructive sleep apnea (adult) (pediatric): Secondary | ICD-10-CM

## 2014-02-09 NOTE — Telephone Encounter (Signed)
Split 02/05/14 >> AHI 78.2, SaO2 low 52%.  CPAP 13 cm H2O >> AHI 5.4, still had low SaO2 >> centrals with higher CPAP/EPAP.  Will have my nurse inform pt that sleep study shows severe obstructive sleep apnea.  He needs to be set up with CPAP 13 cm H2O with heated humidity and mask of choice.  He needs to use 5 liters oxygen at night with CPAP.  Please arrange for ONO with patient using CPAP 13 cm H2O and 5 liters supplemental oxygen.    He needs to continue wearing 4 to 5 liters oxygen during the day.  Please arrange for ROV 2 months after setting up CPAP.

## 2014-02-09 NOTE — Sleep Study (Signed)
[place]  NAME: Douglas [name] DATE OF BIRTH:  Oct 24, 1985 MEDICAL RECORD NUMBER [id number]  LOCATION: [facility]  PHYSICIAN: [name], M.D. DATE OF STUDY: 02/05/2014  SLEEP STUDY TYPE: Split night sleep study               REFERRING PHYSICIAN: Philis Fendt, MD  INDICATION FOR STUDY:  Douglas [name] is a 29 y.o. male who presents to the sleep lab for evaluation of hypersomnia with obstructive sleep apnea.  He reports snoring, sleep disruption, apnea, and daytime sleepiness.  He has hx of severe OSA, polycythemia, and hypercapnia.  He had sleep study from 08/14/09 showing AHI of 105.4 and SaO2 low of 56%.  EPWORTH SLEEPINESS SCORE: 16. HEIGHT: 5\' 5"  (165.1 cm)  WEIGHT: 270 lb (122.471 kg)    Body mass index is 44.93 kg/(m^2).  NECK SIZE: 18.5 in.  MEDICATIONS:  Current Outpatient Prescriptions on File Prior to Visit  Medication Sig Dispense Refill  . albuterol (PROVENTIL HFA;VENTOLIN HFA) 108 (90 BASE) MCG/ACT inhaler Inhale 1-2 puffs into the lungs every 6 (six) hours as needed for wheezing or shortness of breath. 1 Inhaler 0  . amLODipine (NORVASC) 10 MG tablet Take 1 tablet (10 mg total) by mouth daily. 30 tablet 0  . aspirin EC 325 MG EC tablet Take 1 tablet (325 mg total) by mouth daily. 30 tablet 0  . furosemide (LASIX) 40 MG tablet Take 1 tablet (40 mg total) by mouth 2 (two) times daily. 60 tablet 6  . lisinopril (PRINIVIL,ZESTRIL) 5 MG tablet Take 1 tablet (5 mg total) by mouth daily. 30 tablet 0   No current facility-administered medications on file prior to visit.    SLEEP ARCHITECTURE:  Diagnostic portion: Total recording time: 132 minutes.  Total sleep time was: 123.5 minutes.  Sleep efficiency: 93.6%.  Sleep latency: 1.5 minutes.  REM latency: 82.5 minutes.  Stage N1: 9.7%.  Stage N2: 83%.  Stage N3: 0%.  Stage R:  7.3%.  Supine sleep: 123.5 minutes.  Non-supine sleep: 0 minutes.  Titration portion: Total  recording time: 244 minutes.  Total sleep time was: 232 minutes.  Sleep efficiency: 95.1%.  Sleep latency: 4.5 minutes.  REM latency: 53.5 minutes.  Stage N1: 2.4%.  Stage N2: 65.1%.  Stage N3: 0%.  Stage R:  32.5%.  Supine sleep: 232 minutes.  Non-supine sleep: 0 minutes  CARDIAC DATA:  Average heart rate: 98 beats per minute. Rhythm strip: sinus rhythm with PACs.  RESPIRATORY DATA: Average respiratory rate: 25. Snoring: loud.  Diagnostic portion: Average AHI: 78.2.   Apnea index: 6.3.  Hypopnea index: 71.9. Obstructive apnea index: 6.3.  Central apnea index: 0.  Mixed apnea index: 0. REM AHI: 60.  NREM AHI: 79.7. Supine AHI: 78.2. Non-supine AHI: N/A.  Titration portion: He was started on CPAP 5 an increased to 15 cm H2O.  He was also tried on BiPAP 17/13 and increased to 23/19 cm H2O.  With CPAP or EPAP settings above 13 cm H2O he developed central apneas, and there was no improvement for this with transition from CPAP to BiPAP.  With CPAP at 13 cm H2O he appeared to have good control of his obstructive sleep apnea with an AHI of 5.4.  At this pressure setting he was observed in REM sleep and supine sleep.  MOVEMENT/PARASOMNIA:  Periodic limb movement: 0.  Period limb movements with arousals: 0. Restroom trips: 0.  OXYGEN DATA:  Baseline oxygenation: 95%. Lowest SaO2:  52%. Time spent below SaO2 90%: 108.6 minutes. Supplemental oxygen used: 3 liters.  He has baseline daytime hypoxemia.  He started the study using 2 liters oxygen.  He had good control of his sleep apnea with CPAP of 13 cm H2O.  He had continued oxygen desaturation in spite of good control of his sleep apnea with CPAP.  He was increased to 3 liters oxygen with CPAP.  He did not have any improvement in his oxygenation with BiPAP.  IMPRESSION/ RECOMMENDATION:   This study shows severe obstructive sleep apnea with an AHI of 78.2 and SaO2 low of 52%.  He had significant oxygen desaturation in the absence of other  respiratory events.  This is consistent with his history of sleep related hypoxia/hypoventilation.  He appeared to have optimal control of his obstructive sleep apnea with CPAP 13 cm H2O.  At this pressure setting he was observed in REM sleep and supine sleep.  He was fitted for a Fisher Paykel medium size Simplus full face mask.  He developed central apneas with CPAP/EPAP settings above 13 cm H2O, and this was associated with more sleep disruption.  He had continued oxygen desaturation in spite of being on adequate CPAP therapy to control his sleep apnea for greater than 5 minutes.  His oxygenation was not improved with trial on BiPAP.  He was titrated up to 3 liters oxygen with some improvement, but still had oxygen desaturation.  I would recommend that the patient be started on CPAP 13 cm H2O with 5 liters oxygen at night.  He should then have an overnight oximetry as an outpatient to determine if he needs further adjustments to his set up.    [name], M.D. Diplomate, Tax adviser of Sleep Medicine  ELECTRONICALLY SIGNED ON:  02/09/2014, 11:07 AM [place] PH: [phone]   FX: [phone] [place]

## 2014-02-13 NOTE — Telephone Encounter (Signed)
Results have been explained to patient, pt expressed understanding. Orders placed for ONO and CPAP start. Pt also aware to continue wearing O2 at night and during daytime. Recall entered for appt in 2 months. Nothing further needed.

## 2014-02-17 ENCOUNTER — Telehealth: Payer: Self-pay | Admitting: Pulmonary Disease

## 2014-02-17 NOTE — Telephone Encounter (Signed)
Spoke with pt, wants to know when he will have his ONO study performed.  I advised pt that the ONO won't be scheduled until he has his cpap machine, which he has not received yet.  I also advised him that his DME homecare company will contact him to set up a time for him to get his cpap and that we don't have those in the office here.  Forwarding to pcc's to follow up on, as pt has not heard anything about his cpap yet.  Thank you!

## 2014-02-17 NOTE — Telephone Encounter (Signed)
Order given to ahc Sally E Ottinger ° °

## 2014-03-18 ENCOUNTER — Telehealth: Payer: Self-pay | Admitting: Pulmonary Disease

## 2014-03-18 NOTE — Telephone Encounter (Signed)
ONO with 5 liters 02/27/14 >> test time 58 min.  Basal SpO2 66%, low SpO2 50%.  Spent 55 min with SpO2 < 88%.  Will have my nurse call to schedule next available ROV with either me or [name] [name] to discuss his regimen for OSA, OHS, and chronic respiratory failure.    Concerned that if he is not able to get appropriate set up at home, that he might need hospital admission to assess for tracheostomy and nocturnal vent therapy.

## 2014-03-18 NOTE — Telephone Encounter (Signed)
Results have been explained to patient, pt expressed understanding. Scheduled for OV with VS 03/19/14 at 3pm. Nothing further needed.

## 2014-03-19 ENCOUNTER — Inpatient Hospital Stay (HOSPITAL_COMMUNITY)
Admission: AD | Admit: 2014-03-19 | Discharge: 2014-03-25 | DRG: 175 | Disposition: A | Discharge status: Home Health | Payer: Medicaid Other | Source: Ambulatory Visit | Attending: Pulmonary Disease | Admitting: Pulmonary Disease

## 2014-03-19 ENCOUNTER — Ambulatory Visit (INDEPENDENT_AMBULATORY_CARE_PROVIDER_SITE_OTHER): Payer: Medicaid Other | Admitting: Pulmonary Disease

## 2014-03-19 ENCOUNTER — Ambulatory Visit (HOSPITAL_COMMUNITY)
Admission: RE | Admit: 2014-03-19 | Discharge: 2014-03-19 | Disposition: A | Discharge status: Home / Self Care | Payer: Medicaid Other | Source: Ambulatory Visit | Attending: Pulmonary Disease | Admitting: Pulmonary Disease

## 2014-03-19 ENCOUNTER — Encounter: Payer: Self-pay | Admitting: Pulmonary Disease

## 2014-03-19 ENCOUNTER — Encounter (HOSPITAL_COMMUNITY): Payer: Self-pay

## 2014-03-19 VITALS — BP 132/84 | HR 94 | Temp 98.0°F | Ht 64.0 in | Wt 333.8 lb

## 2014-03-19 DIAGNOSIS — Z87891 Personal history of nicotine dependence: Secondary | ICD-10-CM | POA: Diagnosis not present

## 2014-03-19 DIAGNOSIS — D751 Secondary polycythemia: Secondary | ICD-10-CM

## 2014-03-19 DIAGNOSIS — F1490 Cocaine use, unspecified, uncomplicated: Secondary | ICD-10-CM | POA: Diagnosis present

## 2014-03-19 DIAGNOSIS — Z9989 Dependence on other enabling machines and devices: Secondary | ICD-10-CM

## 2014-03-19 DIAGNOSIS — M1990 Unspecified osteoarthritis, unspecified site: Secondary | ICD-10-CM | POA: Diagnosis present

## 2014-03-19 DIAGNOSIS — Z7982 Long term (current) use of aspirin: Secondary | ICD-10-CM

## 2014-03-19 DIAGNOSIS — E662 Morbid (severe) obesity with alveolar hypoventilation: Secondary | ICD-10-CM

## 2014-03-19 DIAGNOSIS — I5033 Acute on chronic diastolic (congestive) heart failure: Secondary | ICD-10-CM | POA: Diagnosis present

## 2014-03-19 DIAGNOSIS — N390 Urinary tract infection, site not specified: Secondary | ICD-10-CM | POA: Diagnosis present

## 2014-03-19 DIAGNOSIS — D72829 Elevated white blood cell count, unspecified: Secondary | ICD-10-CM

## 2014-03-19 DIAGNOSIS — I5031 Acute diastolic (congestive) heart failure: Secondary | ICD-10-CM

## 2014-03-19 DIAGNOSIS — J9621 Acute and chronic respiratory failure with hypoxia: Secondary | ICD-10-CM | POA: Diagnosis present

## 2014-03-19 DIAGNOSIS — E669 Obesity, unspecified: Secondary | ICD-10-CM

## 2014-03-19 DIAGNOSIS — J9622 Acute and chronic respiratory failure with hypercapnia: Secondary | ICD-10-CM | POA: Diagnosis present

## 2014-03-19 DIAGNOSIS — I2609 Other pulmonary embolism with acute cor pulmonale: Secondary | ICD-10-CM

## 2014-03-19 DIAGNOSIS — G9340 Encephalopathy, unspecified: Secondary | ICD-10-CM | POA: Diagnosis present

## 2014-03-19 DIAGNOSIS — E780 Pure hypercholesterolemia: Secondary | ICD-10-CM | POA: Diagnosis present

## 2014-03-19 DIAGNOSIS — Z9119 Patient's noncompliance with other medical treatment and regimen: Secondary | ICD-10-CM | POA: Diagnosis present

## 2014-03-19 DIAGNOSIS — E875 Hyperkalemia: Secondary | ICD-10-CM | POA: Diagnosis not present

## 2014-03-19 DIAGNOSIS — I272 Pulmonary hypertension, unspecified: Secondary | ICD-10-CM

## 2014-03-19 DIAGNOSIS — J9601 Acute respiratory failure with hypoxia: Secondary | ICD-10-CM

## 2014-03-19 DIAGNOSIS — Z720 Tobacco use: Secondary | ICD-10-CM

## 2014-03-19 DIAGNOSIS — I10 Essential (primary) hypertension: Secondary | ICD-10-CM | POA: Diagnosis present

## 2014-03-19 DIAGNOSIS — I2781 Cor pulmonale (chronic): Secondary | ICD-10-CM | POA: Diagnosis present

## 2014-03-19 DIAGNOSIS — Z79899 Other long term (current) drug therapy: Secondary | ICD-10-CM | POA: Diagnosis not present

## 2014-03-19 DIAGNOSIS — F1910 Other psychoactive substance abuse, uncomplicated: Secondary | ICD-10-CM

## 2014-03-19 DIAGNOSIS — Z6841 Body Mass Index (BMI) 40.0 and over, adult: Secondary | ICD-10-CM

## 2014-03-19 DIAGNOSIS — E8809 Other disorders of plasma-protein metabolism, not elsewhere classified: Secondary | ICD-10-CM

## 2014-03-19 DIAGNOSIS — R809 Proteinuria, unspecified: Secondary | ICD-10-CM

## 2014-03-19 DIAGNOSIS — G4733 Obstructive sleep apnea (adult) (pediatric): Secondary | ICD-10-CM

## 2014-03-19 DIAGNOSIS — R601 Generalized edema: Secondary | ICD-10-CM

## 2014-03-19 LAB — BRAIN NATRIURETIC PEPTIDE: B NATRIURETIC PEPTIDE 5: 329 pg/mL — AB (ref 0.0–100.0)

## 2014-03-19 LAB — COMPREHENSIVE METABOLIC PANEL
ALT: 304 U/L — AB (ref 0–53)
AST: 115 U/L — AB (ref 0–37)
Albumin: 2.5 g/dL — ABNORMAL LOW (ref 3.5–5.2)
Alkaline Phosphatase: 86 U/L (ref 39–117)
Anion gap: 10 (ref 5–15)
BUN: 21 mg/dL (ref 6–23)
CALCIUM: 8.2 mg/dL — AB (ref 8.4–10.5)
CO2: 39 mmol/L — AB (ref 19–32)
Chloride: 87 mmol/L — ABNORMAL LOW (ref 96–112)
Creatinine, Ser: 1.15 mg/dL (ref 0.50–1.35)
GFR calc Af Amer: >90 mL/min (ref >90)
GFR calc non Af Amer: 84 mL/min — ABNORMAL LOW (ref >90)
Glucose, Bld: 81 mg/dL (ref 70–99)
Potassium: 4.5 mmol/L (ref 3.5–5.1)
Sodium: 136 mmol/L (ref 135–145)
Total Bilirubin: 2.4 mg/dL — ABNORMAL HIGH (ref 0.3–1.2)
Total Protein: 5.9 g/dL — ABNORMAL LOW (ref 6.0–8.3)

## 2014-03-19 LAB — CBC WITH DIFFERENTIAL/PLATELET
Basophils Absolute: 0 10*3/uL (ref 0.0–0.1)
Basophils Relative: 0 % (ref 0–1)
Eosinophils Absolute: 0.1 10*3/uL (ref 0.0–0.7)
Eosinophils Relative: 1 % (ref 0–5)
HCT: 60.1 % — ABNORMAL HIGH (ref 39.0–52.0)
Hemoglobin: 16 g/dL (ref 13.0–17.0)
LYMPHS PCT: 29 % (ref 12–46)
Lymphs Abs: 2.7 10*3/uL (ref 0.7–4.0)
MCH: 25 pg — AB (ref 26.0–34.0)
MCHC: 26.6 g/dL — AB (ref 30.0–36.0)
MCV: 93.9 fL (ref 78.0–100.0)
MONO ABS: 1.5 10*3/uL — AB (ref 0.1–1.0)
Monocytes Relative: 15 % — ABNORMAL HIGH (ref 3–12)
Neutro Abs: 5.3 10*3/uL (ref 1.7–7.7)
Neutrophils Relative %: 55 % (ref 43–77)
PLATELETS: 134 10*3/uL — AB (ref 150–400)
RBC: 6.4 MIL/uL — ABNORMAL HIGH (ref 4.22–5.81)
RDW: 18.3 % — AB (ref 11.5–15.5)
WBC: 9.6 10*3/uL (ref 4.0–10.5)

## 2014-03-19 LAB — BLOOD GAS, ARTERIAL
ACID-BASE EXCESS: 13.5 mmol/L — AB (ref 0.0–2.0)
BICARBONATE: 43.2 meq/L — AB (ref 20.0–24.0)
DRAWN BY: 307971
O2 Content: 4 L/min
O2 SAT: 91.7 %
PATIENT TEMPERATURE: 98.6
TCO2: 37.1 mmol/L (ref 0–100)
pCO2 arterial: 76.1 mmHg (ref 35.0–45.0)
pH, Arterial: 7.373 (ref 7.350–7.450)
pO2, Arterial: 69.4 mmHg — ABNORMAL LOW (ref 80.0–100.0)

## 2014-03-19 LAB — URINE MICROSCOPIC-ADD ON

## 2014-03-19 LAB — TSH: TSH: 1.532 u[IU]/mL (ref 0.350–4.500)

## 2014-03-19 LAB — URINALYSIS, ROUTINE W REFLEX MICROSCOPIC
Glucose, UA: NEGATIVE mg/dL
Ketones, ur: >80 mg/dL — AB
NITRITE: POSITIVE — AB
PH: 5.5 (ref 5.0–8.0)
Protein, ur: 100 mg/dL — AB
Specific Gravity, Urine: 1.022 (ref 1.005–1.030)
Urobilinogen, UA: 2 mg/dL — ABNORMAL HIGH (ref 0.0–1.0)

## 2014-03-19 LAB — RAPID URINE DRUG SCREEN, HOSP PERFORMED
Amphetamines: NOT DETECTED
BARBITURATES: NOT DETECTED
Benzodiazepines: NOT DETECTED
COCAINE: NOT DETECTED
Opiates: NOT DETECTED
Tetrahydrocannabinol: NOT DETECTED

## 2014-03-19 LAB — TROPONIN I
Troponin I: 0.13 ng/mL — ABNORMAL HIGH (ref <0.031)
Troponin I: 0.17 ng/mL — ABNORMAL HIGH (ref <0.031)

## 2014-03-19 LAB — MRSA PCR SCREENING: MRSA BY PCR: NEGATIVE

## 2014-03-19 LAB — MAGNESIUM: MAGNESIUM: 1.4 mg/dL — AB (ref 1.5–2.5)

## 2014-03-19 MED ORDER — ACETAMINOPHEN 325 MG PO TABS: 650.0000 mg | ORAL_TABLET | Freq: Four times a day (QID) | ORAL | Status: DC | PRN

## 2014-03-19 MED ORDER — ASPIRIN 81 MG PO CHEW
81.0000 mg | CHEWABLE_TABLET | Freq: Every day | ORAL | Status: DC
  Administered 2014-03-19 – 2014-03-25 (×7): 81 mg via ORAL
  Filled 2014-03-19 (×7): qty 1

## 2014-03-19 MED ORDER — CHLORHEXIDINE GLUCONATE 0.12 % MT SOLN
15.0000 mL | Freq: Two times a day (BID) | OROMUCOSAL | Status: DC
  Administered 2014-03-19 – 2014-03-25 (×12): 15 mL via OROMUCOSAL
  Filled 2014-03-19 (×15): qty 15

## 2014-03-19 MED ORDER — PANTOPRAZOLE SODIUM 40 MG PO TBEC
40.0000 mg | DELAYED_RELEASE_TABLET | Freq: Every day | ORAL | Status: DC
  Administered 2014-03-19 – 2014-03-25 (×7): 40 mg via ORAL
  Filled 2014-03-19 (×7): qty 1

## 2014-03-19 MED ORDER — AMLODIPINE BESYLATE 10 MG PO TABS
10.0000 mg | ORAL_TABLET | Freq: Every day | ORAL | Status: DC
  Administered 2014-03-20 – 2014-03-25 (×6): 10 mg via ORAL
  Filled 2014-03-19 (×7): qty 1

## 2014-03-19 MED ORDER — FUROSEMIDE 10 MG/ML IJ SOLN
40.0000 mg | Freq: Two times a day (BID) | INTRAMUSCULAR | Status: DC
  Administered 2014-03-19 – 2014-03-20 (×2): 40 mg via INTRAVENOUS
  Filled 2014-03-19 (×2): qty 4

## 2014-03-19 MED ORDER — CETYLPYRIDINIUM CHLORIDE 0.05 % MT LIQD
7.0000 mL | Freq: Two times a day (BID) | OROMUCOSAL | Status: DC
  Administered 2014-03-20 – 2014-03-25 (×7): 7 mL via OROMUCOSAL

## 2014-03-19 MED ORDER — HEPARIN SODIUM (PORCINE) 5000 UNIT/ML IJ SOLN
5000.0000 [IU] | Freq: Three times a day (TID) | INTRAMUSCULAR | Status: DC
  Administered 2014-03-20 – 2014-03-25 (×16): 5000 [IU] via SUBCUTANEOUS
  Filled 2014-03-19 (×20): qty 1

## 2014-03-19 NOTE — H&P (Signed)
PULMONARY / CRITICAL CARE MEDICINE   Name: Douglas [name] MRN: [id number] DOB: 09-Feb-1985    ADMISSION DATE:  03/19/2014  CHIEF COMPLAINT:  Hypoxia  INITIAL PRESENTATION:  29 yo male former smoker sent from pulmonary office with mental status change, hypoxia, wt gain from acute cor pulmonale in setting of OSA/OHS.  STUDIES:    SIGNIFICANT EVENTS: 12/24 Admit   HISTORY OF PRESENT ILLNESS:   29 yo male followed in pulmonary office for OSA/OHS.  He has not been able to get set up with CPAP/BiPAP.  I am not sure how much he has been using his oxygen.  His family member reports that he has been very confused and sleeping all the time.  He has gained about 45 lbs over the past 2 months.  He is getting more leg swelling and feels like his abdomen is tight.  He gets winded with minimal activity.  He denies chest pain, fever, or cough.    PAST MEDICAL HISTORY :   has a past medical history of Hypertension; Obesity; Tobacco abuse; Substance abuse (8/31/213); High cholesterol; CHF (congestive heart failure); Chronic bronchitis; OSA on CPAP; Arthritis; and Pneumonia (09/2011).  has past surgical history that includes No past surgeries. Prior to Admission medications   Medication Sig Start Date End Date Taking? Authorizing Provider  albuterol (PROVENTIL HFA;VENTOLIN HFA) 108 (90 BASE) MCG/ACT inhaler Inhale 1-2 puffs into the lungs every 6 (six) hours as needed for wheezing or shortness of breath. 11/05/13   Shanker Kristeen Mans, MD  amLODipine (NORVASC) 10 MG tablet Take 1 tablet (10 mg total) by mouth daily. 11/05/13   Shanker Kristeen Mans, MD  aspirin EC 325 MG EC tablet Take 1 tablet (325 mg total) by mouth daily. Patient not taking: Reported on 03/19/2014 11/05/13   Jonetta Osgood, MD  furosemide (LASIX) 40 MG tablet Take 1 tablet (40 mg total) by mouth 2 (two) times daily. 01/27/14   Evelina Bucy, MD  lisinopril (PRINIVIL,ZESTRIL) 5 MG tablet Take 1 tablet (5 mg total) by mouth daily. 11/05/13    Shanker Kristeen Mans, MD   No Known Allergies  FAMILY HISTORY:  indicated that his mother is deceased. He indicated that his father is deceased.  SOCIAL HISTORY:  reports that he quit smoking about 4 months ago. His smoking use included Cigarettes. He has a 4.5 pack-year smoking history. He has never used smokeless tobacco. He reports that he uses illicit drugs (Cocaine). He reports that he does not drink alcohol.  REVIEW OF SYSTEMS:   Negative except above.  SUBJECTIVE:   VITAL SIGNS: Temp:  [98 F (36.7 C)] 98 F (36.7 C) (02/24 1514) Pulse Rate:  [71-94] 94 (02/24 1515) BP: (132)/(84) 132/84 mmHg (02/24 1514) SpO2:  [71 %-95 %] 95 % (02/24 1515) Weight:  [333 lb 12.8 oz (151.411 kg)] 333 lb 12.8 oz (151.411 kg) (02/24 1514) INTAKE / OUTPUT: No intake or output data in the 24 hours ending 03/19/14 1703  PHYSICAL EXAMINATION: General:  Somnolent Neuro:  Confused prior to applying oxygen, difficulty with memory, moves extremities HEENT:  Pupils reactive, MP 4, no LAN Cardiovascular:  Regular, no murmur Lungs:  Decreased breath sounds, basilar faint crackles Abdomen:  Obese, firm, non tender, decreased bowel sounds Musculoskeletal:  2+ edema Skin:  Venous stasis changes  LABS:  CBC No results for input(s): WBC, HGB, HCT, PLT in the last 168 hours. Coag's No results for input(s): APTT, INR in the last 168 hours. BMET No results for input(s): NA, K,  CL, CO2, BUN, CREATININE, GLUCOSE in the last 168 hours. Electrolytes No results for input(s): CALCIUM, MG, PHOS in the last 168 hours. Sepsis Markers No results for input(s): LATICACIDVEN, PROCALCITON, O2SATVEN in the last 168 hours. ABG No results for input(s): PHART, PCO2ART, PO2ART in the last 168 hours. Liver Enzymes No results for input(s): AST, ALT, ALKPHOS, BILITOT, ALBUMIN in the last 168 hours. Cardiac Enzymes No results for input(s): TROPONINI, PROBNP in the last 168 hours. Glucose No results for input(s):  GLUCAP in the last 168 hours.  Imaging No results found.  Labs, CXR, ECG pending  ASSESSMENT / PLAN:  PULMONARY A: Acute cor pulmonale 2nd to untx OSA/OHS. P:   Admit to ICU Supplemental oxygen to keep SpO2 > 90% BiPAP qhs and prn F/u CXR, ABG  CARDIOVASCULAR A:  Acute on chronic diastolic CHF. P:  Lasix IV  Check cardiac enzymes, BNP, ECG Continue amlodipine  RENAL A:   No acute issues. P:   Check BMET, U/A  GASTROINTESTINAL A:   Morbid obesity P:   Heart healthy diet  HEMATOLOGIC A:   No acute issues. P:  Check CBC SQ heparin for DVT prevention  INFECTIOUS A:   No evidence for infection. P:   Monitor clinically  ENDOCRINE A:   No acute issues. P:   Check blood sugar on BMET Check TSH  NEUROLOGIC A:   Acute encephalopathy 2nd to hypoxic respiratory failure. P:   Monitor   [name], MD [place] 03/19/2014, 5:03 PM Pager:  [phone] After 3pm call: [phone]

## 2014-03-19 NOTE — Progress Notes (Signed)
Chief Complaint  Patient presents with  . Follow-up    Review ONO. Pt O2 ranging 68-71% at rest upon arrival- placed on 2 liters O2, recovered to 95%    History of Present Illness: Douglas [name] is a 29 y.o. male with chronic respiratory failure from OSA, OHS, cor pulmonale, and diastolic CHF.  He presented to office with hypoxia.  He was confused and had trouble maintaining alertness.  His family reports he can't remember anything, he has been swelling, and his abdomen is getting blown up.  TESTS: PSG 08/14/09 >> AHI 105.4, SaO2 low 56%, BiPAP 21/17 cm H2O Echo 11/01/13 >> severe LVH, EF 55 to 60%, mod RA/RV dilation V/Q scan 11/01/13 >> upper lobe airtrapping b/l Split 02/05/14 >> AHI 78.2, SaO2 low 52%. CPAP 13 cm H2O >> AHI 5.4, still had low SaO2 >> centrals with higher CPAP/EPAP. ONO with 5 liters 02/27/14 >> test time 58 min. Basal SpO2 66%, low SpO2 50%. Spent 55 min with SpO2 < 88%.  Past medical hx, Past surgical hx, Medications, Allergies, Family hx, Social hx all reviewed.   Physical Exam: Blood pressure 132/84, pulse 94, temperature 98 F (36.7 C), temperature source Oral, height 5\' 4"  (1.626 m), weight 333 lb 12.8 oz (151.411 kg), SpO2 95 %. Body mass index is 57.27 kg/(m^2).  General - ill appearing ENT - No sinus tenderness, no oral exudate, no LAN, MP 4 Cardiac - s1s2 regular, no murmur Chest - decreased BS, faint basilar crackles Back - No focal tenderness Abd - firm, decreased BS Ext - 2+ edema Neuro - Normal strength, CN intact, poor focus/memory, somnolent Skin - venous stasis changes Psych - somnolent   Assessment/Plan:  Acute cor pulmonale. Plan: - admit to hospital >> see hospital admission note for further details  Obstructive sleep apnea. Obesity hypoventilation syndrome. Morbid obesity Diastolic heart failure   Douglas Mires, MD [place] [facility]Critical [facility]/Sleep Pager:  [phone]

## 2014-03-20 DIAGNOSIS — I509 Heart failure, unspecified: Secondary | ICD-10-CM

## 2014-03-20 DIAGNOSIS — J9622 Acute and chronic respiratory failure with hypercapnia: Secondary | ICD-10-CM

## 2014-03-20 DIAGNOSIS — J9601 Acute respiratory failure with hypoxia: Secondary | ICD-10-CM

## 2014-03-20 DIAGNOSIS — I319 Disease of pericardium, unspecified: Secondary | ICD-10-CM

## 2014-03-20 LAB — CBC
HCT: 61 % — ABNORMAL HIGH (ref 39.0–52.0)
HEMOGLOBIN: 15.7 g/dL (ref 13.0–17.0)
MCH: 24.5 pg — ABNORMAL LOW (ref 26.0–34.0)
MCHC: 25.7 g/dL — ABNORMAL LOW (ref 30.0–36.0)
MCV: 95.3 fL (ref 78.0–100.0)
Platelets: 133 10*3/uL — ABNORMAL LOW (ref 150–400)
RBC: 6.4 MIL/uL — AB (ref 4.22–5.81)
RDW: 18.4 % — ABNORMAL HIGH (ref 11.5–15.5)
WBC: 7.6 10*3/uL (ref 4.0–10.5)

## 2014-03-20 LAB — BASIC METABOLIC PANEL
Anion gap: 8 (ref 5–15)
BUN: 19 mg/dL (ref 6–23)
CHLORIDE: 82 mmol/L — AB (ref 96–112)
CO2: 44 mmol/L (ref 19–32)
Calcium: 8.3 mg/dL — ABNORMAL LOW (ref 8.4–10.5)
Creatinine, Ser: 1.12 mg/dL (ref 0.50–1.35)
GFR calc non Af Amer: 87 mL/min — ABNORMAL LOW (ref >90)
Glucose, Bld: 101 mg/dL — ABNORMAL HIGH (ref 70–99)
POTASSIUM: 4 mmol/L (ref 3.5–5.1)
Sodium: 134 mmol/L — ABNORMAL LOW (ref 135–145)

## 2014-03-20 LAB — TROPONIN I: Troponin I: 0.15 ng/mL — ABNORMAL HIGH (ref <0.031)

## 2014-03-20 MED ORDER — CEFTRIAXONE SODIUM 1 G IJ SOLR
1.0000 g | INTRAMUSCULAR | Status: DC
  Administered 2014-03-20 – 2014-03-23 (×4): 1 g via INTRAVENOUS
  Filled 2014-03-20 (×4): qty 10

## 2014-03-20 MED ORDER — POTASSIUM CHLORIDE CRYS ER 20 MEQ PO TBCR
40.0000 meq | EXTENDED_RELEASE_TABLET | Freq: Two times a day (BID) | ORAL | Status: DC
  Administered 2014-03-20 – 2014-03-23 (×8): 40 meq via ORAL
  Filled 2014-03-20 (×10): qty 2

## 2014-03-20 MED ORDER — FUROSEMIDE 10 MG/ML IJ SOLN
80.0000 mg | Freq: Three times a day (TID) | INTRAMUSCULAR | Status: DC
  Administered 2014-03-20: 40 mg via INTRAVENOUS
  Administered 2014-03-20 – 2014-03-24 (×13): 80 mg via INTRAVENOUS
  Filled 2014-03-20 (×18): qty 8

## 2014-03-20 MED ORDER — MAGNESIUM SULFATE 2 GM/50ML IV SOLN
2.0000 g | Freq: Once | INTRAVENOUS | Status: AC
  Administered 2014-03-20: 2 g via INTRAVENOUS
  Filled 2014-03-20: qty 50

## 2014-03-20 NOTE — Care Management Note (Signed)
CARE MANAGEMENT NOTE 03/20/2014  Patient:  Douglas [name], Douglas [name]   Account Number:  [id number]  Date Initiated:  03/20/2014  Documentation initiated by:  DAVIS,RHONDA  Subjective/Objective Assessment:   29 yo male former smoker sent from pulmonary office with mental status change, hypoxia, wt gain from acute cor pulmonale in setting of OSA/OHS.     Action/Plan:   home when stable may nee c -pap or bipap   Anticipated DC Date:  03/23/2014   Anticipated DC Plan:  [place]  In-house referral  NA      DC Planning Services  NA      [facility] Choice  NA   Choice offered to / List presented to:  NA   DME arranged  NA      DME agency  NA     [place] arranged  NA      [place] agency  NA   Status of service:  In process, will continue to follow Medicare Important Message given?   (If response is "NO", the following Medicare IM given date fields will be blank) Date Medicare IM given:   Medicare IM given by:   Date Additional Medicare IM given:   Additional Medicare IM given by:    Discharge Disposition:    Per UR Regulation:  Reviewed for med. necessity/level of care/duration of stay  If discussed at [place] of Stay Meetings, dates discussed:    Comments:  Feb. 25 2016/Rhonda L. Rosana Hoes, RN, BSN, CCM. Case Management Page [phone] No discharge needs present of time of review.

## 2014-03-20 NOTE — Progress Notes (Signed)
ANTIBIOTIC CONSULT NOTE - INITIAL  Pharmacy Consult for Rocephin Indication: UTI  No Known Allergies  Patient Measurements:   Total body weight 151.4kg  Vital Signs: Temp: 98.5 F (36.9 C) (02/25 0800) Temp Source: Oral (02/25 0800) BP: 122/84 mmHg (02/25 0600) Pulse Rate: 80 (02/25 0600) Intake/Output from previous day: 02/24 0701 - 02/25 0700 In: 240 [P.O.:240] Out: 900 [Urine:900] Intake/Output from this shift:    Labs:  Recent Labs  03/19/14 1645 03/20/14 0403  WBC 9.6 7.6  HGB 16.0 15.7  PLT 134* 133*  CREATININE 1.15 1.12   Estimated Creatinine Clearance: 131.1 mL/min (by C-G formula based on Cr of 1.12). No results for input(s): VANCOTROUGH, VANCOPEAK, VANCORANDOM, GENTTROUGH, GENTPEAK, GENTRANDOM, TOBRATROUGH, TOBRAPEAK, TOBRARND, AMIKACINPEAK, AMIKACINTROU, AMIKACIN in the last 72 hours.   Microbiology: Recent Results (from the past 720 hour(s))  MRSA PCR Screening     Status: None   Collection Time: 03/19/14  4:40 PM  Result Value Ref Range Status   MRSA by PCR NEGATIVE NEGATIVE Final    Comment:        The GeneXpert MRSA Assay (FDA approved for NASAL specimens only), is one component of a comprehensive MRSA colonization surveillance program. It is not intended to diagnose MRSA infection nor to guide or monitor treatment for MRSA infections.    Medical History: Past Medical History  Diagnosis Date  . Hypertension   . Obesity   . Tobacco abuse   . Substance abuse 8/31/213    cociane "first time use"  . High cholesterol   . CHF (congestive heart failure)   . Chronic bronchitis   . OSA on CPAP   . Arthritis     "hands, feet" (10/31/2013)  . Pneumonia 09/2011   Medications:  Scheduled:  . amLODipine  10 mg Oral Daily  . antiseptic oral rinse  7 mL Mouth Rinse q12n4p  . aspirin  81 mg Oral Daily  . cefTRIAXone (ROCEPHIN)  IV  1 g Intravenous Q24H  . chlorhexidine  15 mL Mouth Rinse BID  . furosemide  80 mg Intravenous 3 times per day   . heparin subcutaneous  5,000 Units Subcutaneous 3 times per day  . magnesium sulfate 1 - 4 g bolus IVPB  2 g Intravenous Once  . pantoprazole  40 mg Oral Daily  . potassium chloride  40 mEq Oral BID   Anti-infectives    Start     Dose/Rate Route Frequency Ordered Stop   03/20/14 1000  cefTRIAXone (ROCEPHIN) 1 g in dextrose 5 % 50 mL IVPB     1 g 100 mL/hr over 30 Minutes Intravenous Every 24 hours 03/20/14 0840       Assessment: 70 yoM, direct admit 2/24 from [place] office with hypoxia, acute Cor pulmonale with OSA/OHS. Weight gain of 45 lb over 2 months with LE edema. Stopped tobacco ~ 4 months ago & states cocaine use.  U/A positive for nitrites, moderate leukocytosis  Begin Rocephin for probable UTI, no urine culture ordered  Goal of Therapy:  Antibiotic, dose and schedule appropriate for treatment, renal function  Plan:   Rocephin 1gm q24  Minda Ditto PharmD Pager [phone] 03/20/2014, 9:02 AM

## 2014-03-20 NOTE — Progress Notes (Signed)
Douglas [name] is Bipap dependent when he is not awake. It is unclear whether he has a CPAP/BIPAP at home. He states he reclines  with legs stretched out on the sofa at home most of the day.

## 2014-03-20 NOTE — Progress Notes (Signed)
PULMONARY / CRITICAL CARE MEDICINE   Name: Douglas [name] MRN: [id number] DOB: 04-18-1985    ADMISSION DATE:  03/19/2014  CHIEF COMPLAINT:  Hypoxia  INITIAL PRESENTATION:  29 y/o male former smoker sent from pulmonary office with mental status change, hypoxia, wt gain from acute cor pulmonale in setting of OSA/OHS.  STUDIES:    SIGNIFICANT EVENTS: 2/24 Admit w decompensated OSA/OHS    SUBJECTIVE: Pt reports feeling much better after use of BiPAP overnight.  Indicates he does not have a machine at home anymore, has missed several doses of lasix and only intermittently wears his oxygen.   VITAL SIGNS: Temp:  [97.4 F (36.3 C)-99 F (37.2 C)] 98.5 F (36.9 C) (02/25 0800) Pulse Rate:  [71-105] 80 (02/25 0600) Resp:  [7-24] 7 (02/25 0600) BP: (107-140)/(66-97) 122/84 mmHg (02/25 0600) SpO2:  [71 %-99 %] 99 % (02/25 0600) Weight:  [333 lb 12.8 oz (151.411 kg)] 333 lb 12.8 oz (151.411 kg) (02/24 1514)   INTAKE / OUTPUT:  Intake/Output Summary (Last 24 hours) at 03/20/14 0819 Last data filed at 03/19/14 2100  Gross per 24 hour  Intake    240 ml  Output    900 ml  Net   -660 ml    PHYSICAL EXAMINATION: General:  Morbidly obese male in NAD Neuro:  AAOx4, speech clear, MAE  HEENT:  Pupils reactive, no LAN Cardiovascular:  Regular, no murmur Lungs:  Even/non-labored, decreased breath sounds, basilar faint crackles Abdomen:  Obese, firm, non tender, decreased bowel sounds Musculoskeletal:  2+ edema Skin:  Venous stasis changes  LABS:  CBC  Recent Labs Lab 03/19/14 1645 03/20/14 0403  WBC 9.6 7.6  HGB 16.0 15.7  HCT 60.1* 61.0*  PLT 134* 133*   BMET  Recent Labs Lab 03/19/14 1645 03/20/14 0403  NA 136 134*  K 4.5 4.0  CL 87* 82*  CO2 39* 44*  BUN 21 19  CREATININE 1.15 1.12  GLUCOSE 81 101*   Electrolytes  Recent Labs Lab 03/19/14 1645 03/20/14 0403  CALCIUM 8.2* 8.3*  MG 1.4*  --    ABG  Recent Labs Lab 03/19/14 1748  PHART 7.373   PCO2ART 76.1*  PO2ART 69.4*   Liver Enzymes  Recent Labs Lab 03/19/14 1645  AST 115*  ALT 304*  ALKPHOS 86  BILITOT 2.4*  ALBUMIN 2.5*   Cardiac Enzymes  Recent Labs Lab 03/19/14 1645 03/19/14 2216 03/20/14 0401  TROPONINI 0.13* 0.17* 0.15*   Glucose No results for input(s): GLUCAP in the last 168 hours.  Imaging No results found.  Labs, CXR, ECG pending  ASSESSMENT / PLAN:  PULMONARY A: Acute cor pulmonale 2nd to untx OSA/OHS. Acute on Chronic Hypercarbic Respiratory Failure P:   Supplemental oxygen to keep SpO2 > 90% BiPAP qhs and prn F/u CXR, ABG Change to SDU status Will need home BiPAP arranged prior to discharge  CARDIOVASCULAR A:  Acute on chronic diastolic CHF - elevated BNP, minimal rise in troponin, EKG with RV hypertrophy P:  Lasix IV, 40 BID Continue amlodipine Stress compliance with home regimen  RENAL A:   No acute issues. P:   Trend BMET  GASTROINTESTINAL A:   Morbid obesity P:   Heart healthy diet  HEMATOLOGIC A:   No acute issues. P:  Follow CBC SQ heparin for DVT prevention  INFECTIOUS A:   UTI  P:   UA 2/25 >> nitrite pos, mod leuks, 21-50 wbc, many bacteria  Rocephin 2/25, D1/x   ENDOCRINE A:   No  acute issues. P:   Follow blood sugar on BMET Check TSH  NEUROLOGIC A:   Acute encephalopathy 2nd to hypoxic respiratory failure - resolving  P:   Monitor / supportive care   Noe Gens, NP-C [facility] Pgr: [phone] or [phone]

## 2014-03-20 NOTE — Progress Notes (Signed)
Pt sitting in cardiac chair. o2 sat 68 ,skin sweaty,slow to arouse. Awoke in few seconds,o2 sat increased to 89 on 3l nasl cannula.

## 2014-03-20 NOTE — Progress Notes (Signed)
  Echocardiogram 2D Echocardiogram has been performed.  Diamond Nickel 03/20/2014, 3:31 PM

## 2014-03-20 NOTE — Progress Notes (Signed)
VASCULAR LAB PRELIMINARY  PRELIMINARY  PRELIMINARY  PRELIMINARY  Bilateral lower extremity venous Dopplers completed.    Preliminary report:  There is no DVT or SVT noted in the bilateral lower extremities.  There is interstitial fluid noted throughout the bilateral lower extremities.  Waveforms are pulsatile, suggestive of fluid overload.  Koltyn Kelsay, RVT 03/20/2014, 9:24 AM

## 2014-03-20 NOTE — Plan of Care (Signed)
Problem: Food- and Nutrition-Related Knowledge Deficit (NB-1.1) Goal: Nutrition education Formal process to instruct or train a patient/client in a skill or to impart knowledge to help patients/clients voluntarily manage or modify food choices and eating behavior to maintain or improve health. Outcome: Completed/Met Date Met:  03/20/14 RD consulted     Wt Readings from Last 3 Encounters:  03/20/14 332 lb 0.2 oz (150.6 kg)  03/19/14 333 lb 12.8 oz (151.411 kg)  02/05/14 270 lb (122.471 kg)   Pt has significant weight gain in past 2 months due to fluid accumulation.  Pt reports no changes in appetite prior to admission.    Dietetic Intern provided "Low Sodium Nutrition Therapy" handout from the Academy of [employer]etics.  Pt states he reads nutrition labels and stays away from as much sodium as he can.  Discussed a few food that might have hidden sodium. Clarified meats that are low in sodium and which meat to avoid.    Pt states his problem is with sugary drinks and he drinks a lot through out the day.  Recommended cutting back on intake of fluids.  Pt states he will just drink water for a while when he gets home.    Expect good compliance.   Body mass index is 55.25 kg/(m^2). Pt meets the criteria for Obesity Class III based on body mass index.   Pt currently on Heart Healthy diet and consuming 100% of meals.  Labs and charts reviewed. No further nutrition interventions warranted at this time.  Please re-consult RD if further nutrition issues arise.    Douglas Picker MS Dietetic Intern Pager Number [phone]

## 2014-03-21 DIAGNOSIS — I5031 Acute diastolic (congestive) heart failure: Secondary | ICD-10-CM

## 2014-03-21 LAB — BASIC METABOLIC PANEL
Anion gap: 11 (ref 5–15)
BUN: 15 mg/dL (ref 6–23)
CALCIUM: 8.5 mg/dL (ref 8.4–10.5)
CO2: 45 mmol/L (ref 19–32)
Chloride: 85 mmol/L — ABNORMAL LOW (ref 96–112)
Creatinine, Ser: 1.04 mg/dL (ref 0.50–1.35)
Glucose, Bld: 74 mg/dL (ref 70–99)
Potassium: 4.4 mmol/L (ref 3.5–5.1)
SODIUM: 141 mmol/L (ref 135–145)

## 2014-03-21 LAB — CBC
HCT: 63.8 % — ABNORMAL HIGH (ref 39.0–52.0)
Hemoglobin: 16.6 g/dL (ref 13.0–17.0)
MCH: 25 pg — AB (ref 26.0–34.0)
MCHC: 26 g/dL — ABNORMAL LOW (ref 30.0–36.0)
MCV: 95.9 fL (ref 78.0–100.0)
Platelets: 143 10*3/uL — ABNORMAL LOW (ref 150–400)
RBC: 6.65 MIL/uL — ABNORMAL HIGH (ref 4.22–5.81)
RDW: 18.4 % — ABNORMAL HIGH (ref 11.5–15.5)
WBC: 5.5 10*3/uL (ref 4.0–10.5)

## 2014-03-21 LAB — PHOSPHORUS: Phosphorus: 3.9 mg/dL (ref 2.3–4.6)

## 2014-03-21 LAB — BRAIN NATRIURETIC PEPTIDE: B NATRIURETIC PEPTIDE 5: 163.7 pg/mL — AB (ref 0.0–100.0)

## 2014-03-21 LAB — MAGNESIUM: Magnesium: 1.5 mg/dL (ref 1.5–2.5)

## 2014-03-21 MED ORDER — MAGNESIUM SULFATE 2 GM/50ML IV SOLN
2.0000 g | Freq: Once | INTRAVENOUS | Status: AC
  Administered 2014-03-21: 2 g via INTRAVENOUS
  Filled 2014-03-21: qty 50

## 2014-03-21 NOTE — Progress Notes (Signed)
PULMONARY / CRITICAL CARE MEDICINE   Name: Douglas [name] MRN: [id number] DOB: 1985-07-25    ADMISSION DATE:  03/19/2014  CHIEF COMPLAINT:  Hypoxia  INITIAL PRESENTATION:  29 y/o male former smoker sent from pulmonary office with mental status change, hypoxia, wt gain from acute cor pulmonale in setting of OSA/OHS.  STUDIES:    SIGNIFICANT EVENTS: 2/24  Admit w decompensated OSA/OHS 2/25  Improved. Indicates he does not have a machine at home anymore, has missed several doses of lasix and only intermittently wears his oxygen.     SUBJECTIVE:  No acute complaints.  Worries about getting a new machine at home.   VITAL SIGNS: Temp:  [97.6 F (36.4 C)-98.6 F (37 C)] 98.3 F (36.8 C) (02/26 0800) Pulse Rate:  [75-94] 91 (02/26 0610) Resp:  [0-23] 16 (02/26 0610) BP: (108-138)/(61-94) 121/88 mmHg (02/26 0610) SpO2:  [77 %-99 %] 96 % (02/26 0610) Weight:  [332 lb 0.2 oz (150.6 kg)] 332 lb 0.2 oz (150.6 kg) (02/25 1100)   INTAKE / OUTPUT:  Intake/Output Summary (Last 24 hours) at 03/21/14 1043 Last data filed at 03/21/14 0931  Gross per 24 hour  Intake   1110 ml  Output   6555 ml  Net  -5445 ml    PHYSICAL EXAMINATION: General:  Morbidly obese male in NAD Neuro:  AAOx4, speech clear, MAE  HEENT:  Pupils reactive, no LAN Cardiovascular:  Regular, no murmur Lungs:  Even/non-labored, decreased breath sounds Abdomen:  Obese, firm, non tender, decreased bowel sounds Musculoskeletal:  2+ edema Skin:  Venous stasis changes  LABS:  CBC  Recent Labs Lab 03/19/14 1645 03/20/14 0403 03/21/14 0400  WBC 9.6 7.6 5.5  HGB 16.0 15.7 16.6  HCT 60.1* 61.0* 63.8*  PLT 134* 133* 143*   BMET  Recent Labs Lab 03/19/14 1645 03/20/14 0403 03/21/14 0400  NA 136 134* 141  K 4.5 4.0 4.4  CL 87* 82* 85*  CO2 39* 44* 45*  BUN 21 19 15   CREATININE 1.15 1.12 1.04  GLUCOSE 81 101* 74   Electrolytes  Recent Labs Lab 03/19/14 1645 03/20/14 0403 03/21/14 0400   CALCIUM 8.2* 8.3* 8.5  MG 1.4*  --  1.5  PHOS  --   --  3.9   ABG  Recent Labs Lab 03/19/14 1748  PHART 7.373  PCO2ART 76.1*  PO2ART 69.4*   Liver Enzymes  Recent Labs Lab 03/19/14 1645  AST 115*  ALT 304*  ALKPHOS 86  BILITOT 2.4*  ALBUMIN 2.5*   Cardiac Enzymes  Recent Labs Lab 03/19/14 1645 03/19/14 2216 03/20/14 0401  TROPONINI 0.13* 0.17* 0.15*   Glucose No results for input(s): GLUCAP in the last 168 hours.  Imaging No results found.    ASSESSMENT / PLAN:  PULMONARY A: Acute cor pulmonale 2nd to untx OSA/OHS. Acute on Chronic Hypercarbic Respiratory Failure P:   Supplemental oxygen to keep SpO2 > 90% BiPAP qhs and prn Transfer to floor Will need home BiPAP arranged prior to discharge  CARDIOVASCULAR A:  Acute on chronic diastolic CHF - elevated BNP, minimal rise in troponin, EKG with RV hypertrophy P:  Lasix IV, 80 mg Q8  Consider change to PO 2/27  Continue amlodipine Stress compliance with home regimen ASA  RENAL A:   No acute issues. P:   Trend BMET  GASTROINTESTINAL A:   Morbid obesity P:   Heart healthy diet PPI  HEMATOLOGIC A:   No acute issues. P:  Follow CBC SQ heparin for DVT prevention  Follow up LE Duplex >>  INFECTIOUS A:   UTI  P:   UA 2/25 >> nitrite pos, mod leuks, 21-50 wbc, many bacteria  Rocephin 2/25, D2/x   ENDOCRINE A:   No acute issues. P:   Follow blood sugar on BMET Check TSH  NEUROLOGIC A:   Acute encephalopathy 2nd to hypoxic respiratory failure - resolving  P:   Monitor / supportive care Mobilize / PT efforts    GLOBAL:  Transfer to medical floor.  Arrange home bipap.  Will need ambulatory O2 assessment.  Final lasix dosing for home to be determined.  Await LE doppler.  Continue ABX for UTI.   Noe Gens, NP-C [facility] Pgr: [phone] or [phone]

## 2014-03-22 DIAGNOSIS — N390 Urinary tract infection, site not specified: Secondary | ICD-10-CM

## 2014-03-22 LAB — CBC
HCT: 60.9 % — ABNORMAL HIGH (ref 39.0–52.0)
HEMOGLOBIN: 16.3 g/dL (ref 13.0–17.0)
MCH: 25 pg — ABNORMAL LOW (ref 26.0–34.0)
MCHC: 26.8 g/dL — ABNORMAL LOW (ref 30.0–36.0)
MCV: 93.4 fL (ref 78.0–100.0)
Platelets: 124 10*3/uL — ABNORMAL LOW (ref 150–400)
RBC: 6.52 MIL/uL — AB (ref 4.22–5.81)
RDW: 18.2 % — ABNORMAL HIGH (ref 11.5–15.5)
WBC: 9.1 10*3/uL (ref 4.0–10.5)

## 2014-03-22 LAB — BASIC METABOLIC PANEL
Anion gap: 10 (ref 5–15)
BUN: 14 mg/dL (ref 6–23)
CHLORIDE: 83 mmol/L — AB (ref 96–112)
CO2: 44 mmol/L — AB (ref 19–32)
Calcium: 8.9 mg/dL (ref 8.4–10.5)
Creatinine, Ser: 1.08 mg/dL (ref 0.50–1.35)
GFR calc Af Amer: >90 mL/min (ref >90)
Glucose, Bld: 82 mg/dL (ref 70–99)
Potassium: 4.4 mmol/L (ref 3.5–5.1)
Sodium: 137 mmol/L (ref 135–145)

## 2014-03-22 NOTE — Progress Notes (Signed)
PULMONARY / CRITICAL CARE MEDICINE   Name: Douglas [name] MRN: [id number] DOB: February 14, 1985    ADMISSION DATE:  03/19/2014  CHIEF COMPLAINT:  Hypoxia  INITIAL PRESENTATION:  29 y/o male former smoker sent from pulmonary office with mental status change, hypoxia, wt gain from acute cor pulmonale in setting of OSA/OHS.  STUDIES:    SIGNIFICANT EVENTS: 2/24  Admit w decompensated OSA/OHS 2/25  Improved. Indicates he does not have a machine at home anymore, has missed several doses of lasix and only intermittently wears his oxygen.     SUBJECTIVE:   C/o pressure on bilevel being too low. No mask issues  VITAL SIGNS: Temp:  [98.1 F (36.7 C)-98.5 F (36.9 C)] 98.1 F (36.7 C) (02/27 0705) Pulse Rate:  [83-96] 83 (02/27 1359) Resp:  [16-20] 20 (02/27 1359) BP: (133-140)/(83-93) 133/83 mmHg (02/27 1359) SpO2:  [91 %-100 %] 100 % (02/27 1359) Weight:  [130.5 kg (287 lb 11.2 oz)-134.718 kg (297 lb)] 130.5 kg (287 lb 11.2 oz) (02/27 0705)   INTAKE / OUTPUT:  Intake/Output Summary (Last 24 hours) at 03/22/14 1421 Last data filed at 03/22/14 0917  Gross per 24 hour  Intake    480 ml  Output      0 ml  Net    480 ml    PHYSICAL EXAMINATION: General:  Morbidly obese male in NAD Neuro:  Alert, moves all 4  HEENT:  Nose without purulence,  No skin breakdown or pressure necrosis from cpap mask Cardiovascular:  Regular, no murmur Lungs:  Decreased depth of inspiration, no wheezing Abdomen:  Obese, firm, non tender, decreased bowel sounds Musculoskeletal:  2+ edema Skin:  Venous stasis changes  LABS:  CBC  Recent Labs Lab 03/20/14 0403 03/21/14 0400 03/22/14 0431  WBC 7.6 5.5 9.1  HGB 15.7 16.6 16.3  HCT 61.0* 63.8* 60.9*  PLT 133* 143* 124*   BMET  Recent Labs Lab 03/20/14 0403 03/21/14 0400 03/22/14 0431  NA 134* 141 137  K 4.0 4.4 4.4  CL 82* 85* 83*  CO2 44* 45* 44*  BUN 19 15 14   CREATININE 1.12 1.04 1.08  GLUCOSE 101* 74 82    Electrolytes  Recent Labs Lab 03/19/14 1645 03/20/14 0403 03/21/14 0400 03/22/14 0431  CALCIUM 8.2* 8.3* 8.5 8.9  MG 1.4*  --  1.5  --   PHOS  --   --  3.9  --    ABG  Recent Labs Lab 03/19/14 1748  PHART 7.373  PCO2ART 76.1*  PO2ART 69.4*   Liver Enzymes  Recent Labs Lab 03/19/14 1645  AST 115*  ALT 304*  ALKPHOS 86  BILITOT 2.4*  ALBUMIN 2.5*   Cardiac Enzymes  Recent Labs Lab 03/19/14 1645 03/19/14 2216 03/20/14 0401  TROPONINI 0.13* 0.17* 0.15*   Glucose No results for input(s): GLUCAP in the last 168 hours.  Imaging No results found.    ASSESSMENT / PLAN:  PULMONARY A: Acute cor pulmonale 2nd to untx OSA/OHS. Acute on Chronic Hypercarbic Respiratory Failure P:   Will put his bilevel device on auto to see if this is more comfortable.  CARDIOVASCULAR A:  Acute on chronic diastolic CHF - elevated BNP, minimal rise in troponin, EKG with RV hypertrophy P:  -continue aggressive diuresis while in hospital.  Renal function is stable today.   INFECTIOUS A:   UTI  P:   UA 2/25 >> nitrite pos, mod leuks, 21-50 wbc, many bacteria  Rocephin 2/25, D2/x but no urine culture ever sent.  ?  d/c soon.

## 2014-03-23 LAB — BASIC METABOLIC PANEL
Anion gap: 10 (ref 5–15)
BUN: 12 mg/dL (ref 6–23)
CHLORIDE: 83 mmol/L — AB (ref 96–112)
CO2: 43 mmol/L (ref 19–32)
Calcium: 9.3 mg/dL (ref 8.4–10.5)
Creatinine, Ser: 1.02 mg/dL (ref 0.50–1.35)
GFR calc Af Amer: >90 mL/min (ref >90)
GFR calc non Af Amer: >90 mL/min (ref >90)
Glucose, Bld: 75 mg/dL (ref 70–99)
Potassium: 4.7 mmol/L (ref 3.5–5.1)
SODIUM: 136 mmol/L (ref 135–145)

## 2014-03-23 MED ORDER — SULFAMETHOXAZOLE-TRIMETHOPRIM 800-160 MG PO TABS
1.0000 | ORAL_TABLET | Freq: Two times a day (BID) | ORAL | Status: DC
  Administered 2014-03-23 – 2014-03-25 (×5): 1 via ORAL
  Filled 2014-03-23 (×6): qty 1

## 2014-03-23 NOTE — Clinical Social Work Psychosocial (Signed)
Clinical Social Work Department BRIEF PSYCHOSOCIAL ASSESSMENT 03/23/2014  Patient:  Douglas [name], Douglas [name]     Account Number:  [id number]     [place] date:  03/19/2014  Clinical Social Worker:  Dede Query, CLINICAL SOCIAL WORKER  Date/Time:  03/23/2014 04:44 PM  Referred by:  Physician  Date Referred:  03/23/2014  Other Referral:   psychosocial needs   Interview type:  Patient Other interview type:    PSYCHOSOCIAL DATA Living Status:  FAMILY Admitted from facility:   Level of care:   Primary support name:  Aleshia Primary support relationship to patient:  SIBLING Degree of support available:   high/she is supportive    CURRENT CONCERNS  Other Concerns:    SOCIAL WORK ASSESSMENT / PLAN CSW met with pt at bedside.  CSW introduced herself and described role of CSW.  CSW prompted pt to discuss history and needs.  CSW provided supportive listening and helped pt explore services he may need when he discharges to home. CSW assessed that pt has difficulty with medication compliance in home due to not having a system in place. CSW will ask Md to order in home social work/medical services for pt when he discharges.  CSW provided information and application for [employer] transportation services to help pt get to his medical appointment as he currently has no transportation.  CSW explored with pt whether he feels safe in his home environment.   Assessment/plan status:  No Further Intervention Required Other assessment/ plan:   Information/referral to community resources:    PATIENT'S/FAMILY'S RESPONSE TO PLAN OF CARE: Pt stated that he came to the hospital due to his breathing and fluid buildup.  Pt stated that he had a cpap machine at home but didn't take it seriously so he didn't use it and now knows this has led to his hospitilization.  Pt also stated that he has trouble with organizing his medications and this also has led to his hospitalizations.  Pt greatful to have some in home RN and CSW  services when discharged home to help with managing his medications. Pt stated that the cpap he had at home is broken and hospital staff are assisting him with obtaining a new one. Pt stated that he is safe in his home environment but it is "hard to live with my dad when I am used to living on my own.".  Pt applied to housing 6 months ago and looks forward to being on his own again.    Dede Query, LCSW [place] Worker - Weekend Coverage cell #: [phone]

## 2014-03-23 NOTE — Progress Notes (Signed)
ANTIBIOTIC CONSULT NOTE - follow up  Pharmacy Consult for Rocephin Indication: UTI  No Known Allergies  Patient Measurements: Height: 5' 4.5" (163.8 cm) Weight: 287 lb 11.2 oz (130.5 kg) IBW/kg (Calculated) : 60.35 Total body weight 151.4kg  Vital Signs:   Intake/Output from previous day: 02/27 0701 - 02/28 0700 In: 480 [P.O.:480] Out: -  Intake/Output from this shift:    Labs:  Recent Labs  03/21/14 0400 03/22/14 0431 03/23/14 0725  WBC 5.5 9.1  --   HGB 16.6 16.3  --   PLT 143* 124*  --   CREATININE 1.04 1.08 1.02   Estimated Creatinine Clearance: 132.4 mL/min (by C-G formula based on Cr of 1.02). No results for input(s): VANCOTROUGH, VANCOPEAK, VANCORANDOM, GENTTROUGH, GENTPEAK, GENTRANDOM, TOBRATROUGH, TOBRAPEAK, TOBRARND, AMIKACINPEAK, AMIKACINTROU, AMIKACIN in the last 72 hours.   Microbiology: Recent Results (from the past 720 hour(s))  MRSA PCR Screening     Status: None   Collection Time: 03/19/14  4:40 PM  Result Value Ref Range Status   MRSA by PCR NEGATIVE NEGATIVE Final    Comment:        The GeneXpert MRSA Assay (FDA approved for NASAL specimens only), is one component of a comprehensive MRSA colonization surveillance program. It is not intended to diagnose MRSA infection nor to guide or monitor treatment for MRSA infections.    Medical History: Past Medical History  Diagnosis Date  . Hypertension   . Obesity   . Tobacco abuse   . Substance abuse 8/31/213    cociane "first time use"  . High cholesterol   . CHF (congestive heart failure)   . Chronic bronchitis   . OSA on CPAP   . Arthritis     "hands, feet" (10/31/2013)  . Pneumonia 09/2011   Medications:  Scheduled:  . amLODipine  10 mg Oral Daily  . antiseptic oral rinse  7 mL Mouth Rinse q12n4p  . aspirin  81 mg Oral Daily  . cefTRIAXone (ROCEPHIN)  IV  1 g Intravenous Q24H  . chlorhexidine  15 mL Mouth Rinse BID  . furosemide  80 mg Intravenous 3 times per day  . heparin  subcutaneous  5,000 Units Subcutaneous 3 times per day  . pantoprazole  40 mg Oral Daily  . potassium chloride  40 mEq Oral BID   Anti-infectives    Start     Dose/Rate Route Frequency Ordered Stop   03/20/14 1000  cefTRIAXone (ROCEPHIN) 1 g in dextrose 5 % 50 mL IVPB     1 g 100 mL/hr over 30 Minutes Intravenous Every 24 hours 03/20/14 0840       Assessment: 66 yoM, direct admit 2/24 from [place] office with hypoxia, acute Cor pulmonale with OSA/OHS. Weight gain of 45 lb over 2 months with LE edema. Stopped tobacco ~ 4 months ago & states cocaine use.  U/A positive for nitrites, moderate leukocytosis  Begin Rocephin for probable UTI, no urine culture ordered  Goal of Therapy:  Antibiotic, dose and schedule appropriate for treatment, renal function  Plan:   Continue Rocephin 1gm q24  CrCl >113mls/min, pharmacy will follow at a distance  [place] 03/23/2014, 11:26 AM Pager [phone]

## 2014-03-23 NOTE — Progress Notes (Signed)
Clarified with Respiratory therapist Lattie Haw [id number] order. Lattie Haw said patient can be on med surg floor if bipap machine is the black box., which is what the patient is on.

## 2014-03-23 NOTE — Progress Notes (Addendum)
Offered pt assistance with bipap.  Pt stated he is not ready for bed at this time and can/will place it on himself when ready.  Bipap settings are on autobilevel mode, minimum epap=5cm, max ipap=20cm h2o with 5l O2 bleedin.  Sterile water added to humidity chamber.  Pt was advised that RT is available all night should he need further assistance.

## 2014-03-23 NOTE — Progress Notes (Signed)
PULMONARY / CRITICAL CARE MEDICINE   Name: Douglas [name] MRN: [id number] DOB: 1985/03/12    ADMISSION DATE:  03/19/2014  CHIEF COMPLAINT:  Hypoxia  INITIAL PRESENTATION:  29 y/o male former smoker sent from pulmonary office with mental status change, hypoxia, wt gain from acute cor pulmonale in setting of OSA/OHS.  STUDIES:    SIGNIFICANT EVENTS: 2/24  Admit w decompensated OSA/OHS 2/25  Improved. Indicates he does not have a machine at home anymore, has missed several doses of lasix and only intermittently wears his oxygen.     SUBJECTIVE:    Feels better on PP device with higher setting.  Continues to go to BR a lot, but I/O data is inaccurate.  VITAL SIGNS: Temp:  [87 F (30.6 C)-98.2 F (36.8 C)] 98.2 F (36.8 C) (02/27 2035) Pulse Rate:  [16-90] 90 (02/27 2035) Resp:  [20] 20 (02/27 2035) BP: (138)/(94) 138/94 mmHg (02/27 2035) SpO2:  [93 %-98 %] 93 % (02/27 2035)   INTAKE / OUTPUT:  Intake/Output Summary (Last 24 hours) at 03/23/14 1359 Last data filed at 03/22/14 1700  Gross per 24 hour  Intake    240 ml  Output      0 ml  Net    240 ml    PHYSICAL EXAMINATION: General:  Morbidly obese male in NAD Neuro:  Alert, moves all 4  HEENT:  Nose without purulence,  No skin breakdown or pressure necrosis from cpap mask Cardiovascular:  Regular, no murmur Lungs:  Decreased depth of inspiration, no wheezing Abdomen:  Obese, firm, non tender, decreased bowel sounds Musculoskeletal:  2+ edema Skin:  Venous stasis changes  LABS:   BMET  Recent Labs Lab 03/21/14 0400 03/22/14 0431 03/23/14 0725  NA 141 137 136  K 4.4 4.4 4.7  CL 85* 83* 83*  CO2 45* 44* 43*  BUN 15 14 12   CREATININE 1.04 1.08 1.02  GLUCOSE 74 82 75     ASSESSMENT / PLAN:  PULMONARY A: Acute cor pulmonale 2nd to untx OSA/OHS. Acute on Chronic Hypercarbic Respiratory Failure P:   -pt more comfortable on auto bipap. -continue aggressive diuresis, monitor  lytes  CARDIOVASCULAR A:  Acute on chronic diastolic CHF - elevated BNP, minimal rise in troponin, EKG with RV hypertrophy P:     INFECTIOUS A:   UTI  P:   -change to po bactrim

## 2014-03-24 DIAGNOSIS — J9601 Acute respiratory failure with hypoxia: Secondary | ICD-10-CM

## 2014-03-24 LAB — BASIC METABOLIC PANEL
ANION GAP: 7 (ref 5–15)
BUN: 11 mg/dL (ref 6–23)
CALCIUM: 8.9 mg/dL (ref 8.4–10.5)
CO2: 40 mmol/L — AB (ref 19–32)
Chloride: 88 mmol/L — ABNORMAL LOW (ref 96–112)
Creatinine, Ser: 1.09 mg/dL (ref 0.50–1.35)
GFR calc Af Amer: >90 mL/min (ref >90)
GFR calc non Af Amer: 90 mL/min — ABNORMAL LOW (ref >90)
Glucose, Bld: 85 mg/dL (ref 70–99)
Potassium: 5.9 mmol/L — ABNORMAL HIGH (ref 3.5–5.1)
Sodium: 135 mmol/L (ref 135–145)

## 2014-03-24 MED ORDER — FUROSEMIDE 10 MG/ML IJ SOLN
80.0000 mg | Freq: Two times a day (BID) | INTRAMUSCULAR | Status: DC
  Administered 2014-03-25: 80 mg via INTRAVENOUS
  Filled 2014-03-24 (×3): qty 8

## 2014-03-24 NOTE — Progress Notes (Signed)
PULMONARY / CRITICAL CARE MEDICINE   Name: Douglas [name] MRN: [id number] DOB: May 07, 1985    ADMISSION DATE:  03/19/2014  CHIEF COMPLAINT:  Hypoxia  INITIAL PRESENTATION:  29 y/o male former smoker sent from pulmonary office with mental status change, hypoxia, wt gain from acute cor pulmonale in setting of OSA/OHS.  STUDIES:    SIGNIFICANT EVENTS: 2/24  Admit w decompensated OSA/OHS 2/25  Improved. Indicates he does not have a machine at home anymore, has missed several doses of lasix and only intermittently wears his oxygen.     SUBJECTIVE:    Feels better on PP device with higher setting VITAL SIGNS: Temp:  [96.6 F (35.9 C)-98.5 F (36.9 C)] 98.5 F (36.9 C) (02/29 1454) Pulse Rate:  [85-100] 85 (02/29 1454) Resp:  [16-20] 18 (02/29 1454) BP: (131-134)/(69-88) 133/69 mmHg (02/29 1454) SpO2:  [89 %-98 %] 89 % (02/29 1454) Weight:  [121.8 kg (268 lb 8.3 oz)] 121.8 kg (268 lb 8.3 oz) (02/29 0549)   INTAKE / OUTPUT:  Intake/Output Summary (Last 24 hours) at 03/24/14 1456 Last data filed at 03/23/14 2018  Gross per 24 hour  Intake    480 ml  Output    200 ml  Net    280 ml    PHYSICAL EXAMINATION: General:  Morbidly obese male in NAD Neuro:  Alert, moves all 4  HEENT:  Nose without purulence,  No skin breakdown or pressure necrosis from cpap mask Cardiovascular:  Regular, no murmur Lungs:  Decreased depth of inspiration, no wheezing Abdomen:  Obese, firm, non tender, decreased bowel sounds Musculoskeletal:  2+ edema Skin:  Venous stasis changes  LABS:   BMET  Recent Labs Lab 03/22/14 0431 03/23/14 0725 03/24/14 0448  NA 137 136 135  K 4.4 4.7 5.9*  CL 83* 83* 88*  CO2 44* 43* 40*  BUN 14 12 11   CREATININE 1.08 1.02 1.09  GLUCOSE 82 75 85     ASSESSMENT / PLAN:   Acute cor pulmonale 2nd to untx OSA/OHS. Acute on Chronic Hypercarbic Respiratory Failure P:   -pt more comfortable on auto bipap. -continue aggressive diuresis, monitor  lytes -can't d/c until we have a BIPAP for him -needs walking oximetry prior to dc   Acute on chronic diastolic CHF - elevated BNP, minimal rise in troponin, EKG with RV hypertrophy P:  Cont lasix   Hyperkalemia  P: D/c K Decrease lasix F/u chem in am   UTI  P:   -change to po bactrim, complete 2 more days    Still working on dc needs. Concerned about living situation. Currently trying to set up BIPAP for home. Can't go home until this is done.   Erick Colace ACNP-BC [place] Pager # [phone] OR # [phone] if no answer   Attending Note:  I have examined patient, reviewed labs, studies and notes. I have discussed the case with Jerrye Bushy, and I agree with the data and plans as amended above.  Baltazar Apo, MD, PhD 03/24/2014, 5:30 PM [facility] [phone] or if no answer [phone]

## 2014-03-24 NOTE — Care Management Note (Signed)
CARE MANAGEMENT NOTE 03/24/2014  Patient:  Douglas [name], Douglas [name]   Account Number:  [id number]  Date Initiated:  03/20/2014  Documentation initiated by:  DAVIS,RHONDA  Subjective/Objective Assessment:   29 yo male former smoker sent from pulmonary office with mental status change, hypoxia, wt gain from acute cor pulmonale in setting of OSA/OHS.     Action/Plan:   home when stable may nee c -pap or bipap   Anticipated DC Date:  03/25/2014   Anticipated DC Plan:  [place]  In-house referral  NA      DC Planning Services  CM consult      [facility] Choice  HOME HEALTH   Choice offered to / List presented to:  C-1 Patient   DME arranged  BIPAP      DME agency  [place] arranged  [place] RN      [place].   Status of service:  In process, will continue to follow Medicare Important Message given?   (If response is "NO", the following Medicare IM given date fields will be blank) Date Medicare IM given:   Medicare IM given by:   Date Additional Medicare IM given:   Additional Medicare IM given by:    Discharge Disposition:    Per UR Regulation:  Reviewed for med. necessity/level of care/duration of stay  If discussed at [place] of Stay Meetings, dates discussed:    Comments:  03/24/14 Marney Doctor RN,BSN,NCM Spoke with Arbie Cookey from [place] again who states they will deliver pt a new bipap tomorrow here to the hospital. Pt and Laurey Arrow NP informed.  CM will continue to follow. Referral given to [facility] for [facility] services.  03/24/14 Marney Doctor RN,BSN,NCM [phone] Pt with Bipap at home prior to admission.  Per pt bipap is not working.  Bipap is through [employer].  This CM spoke with Counselling psychologist who states that pt has had 2 bipaps and now that this one is broken they will not be fixing it for him.  Pt states that he has only had one machine and when he took this one in to be fixed  [place] states they  can't fix it because it is bug infested.  Pt states he has moved since the bipap was bug infested so where he lives now there are no bugs.  [employer] branch manager called back to see what else can be done to fix his current bipap.  Arbie Cookey Programmer, multimedia) is calling her manager to figure out what else can be done.  CM will continue to follow.  Feb. 25 2016/Rhonda L. Rosana Hoes, RN, BSN, CCM. Case Management [place] [phone] No discharge needs present of time of review.

## 2014-03-24 NOTE — Care Management Note (Signed)
CARE MANAGEMENT NOTE 03/24/2014  Patient:  Douglas [name], Douglas [name]   Account Number:  [id number]  Date Initiated:  03/20/2014  Documentation initiated by:  DAVIS,RHONDA  Subjective/Objective Assessment:   29 yo male former smoker sent from pulmonary office with mental status change, hypoxia, wt gain from acute cor pulmonale in setting of OSA/OHS.     Action/Plan:   home when stable may nee c -pap or bipap   Anticipated DC Date:  03/25/2014   Anticipated DC Plan:  [place]  In-house referral  NA      DC Planning Services  CM consult      [facility] Choice  HOME HEALTH   Choice offered to / List presented to:  C-1 Patient   DME arranged  BIPAP      DME agency  [place] arranged  [place] RN      [place].   Status of service:  In process, will continue to follow Medicare Important Message given?   (If response is "NO", the following Medicare IM given date fields will be blank) Date Medicare IM given:   Medicare IM given by:   Date Additional Medicare IM given:   Additional Medicare IM given by:    Discharge Disposition:    Per UR Regulation:  Reviewed for med. necessity/level of care/duration of stay  If discussed at [place] of Stay Meetings, dates discussed:    Comments:  03/24/14 Marney Doctor RN,BSN,NCM [phone] Pt with Bipap at home prior to admission.  Per pt bipap is not working.  Bipap is through [employer].  This CM spoke with Counselling psychologist who states that pt has had 2 bipaps and now that this one is broken they will not be fixing it for him.  Pt states that he has only had one machine and when he took this one in to be fixed  [place] states they can't fix it because it is bug infested.  Pt states he has moved since the bipap was bug infested so where he lives now there are no bugs.  [employer] branch manager called back to see what else can be done to fix his current bipap.  Arbie Cookey  Programmer, multimedia) is calling her manager to figure out what else can be done.  CM will continue to follow.  Feb. 25 2016/Rhonda L. Rosana Hoes, RN, BSN, CCM. Case Management [place] [phone] No discharge needs present of time of review.

## 2014-03-24 NOTE — Progress Notes (Addendum)
CRITICAL VALUE ALERT  Critical value received:  CO2 40  Date of notification:  03/24/2014  Time of notification:  0521  Critical value read back:Yes.    Nurse who received alert:  Hortencia Conradi RN  MD notified (1st page): Nelda Marseille  Time of first page: 0539  MD notified (2nd page):  Time of second page:  Responding MD:  Nelda Marseille  Time MD responded: [phone]

## 2014-03-25 DIAGNOSIS — R601 Generalized edema: Secondary | ICD-10-CM

## 2014-03-25 LAB — BASIC METABOLIC PANEL
ANION GAP: 10 (ref 5–15)
BUN: 11 mg/dL (ref 6–23)
CO2: 40 mmol/L (ref 19–32)
Calcium: 9.2 mg/dL (ref 8.4–10.5)
Chloride: 86 mmol/L — ABNORMAL LOW (ref 96–112)
Creatinine, Ser: 1.43 mg/dL — ABNORMAL HIGH (ref 0.50–1.35)
GFR calc Af Amer: 75 mL/min — ABNORMAL LOW (ref >90)
GFR, EST NON AFRICAN AMERICAN: 65 mL/min — AB (ref >90)
GLUCOSE: 86 mg/dL (ref 70–99)
Potassium: 4.4 mmol/L (ref 3.5–5.1)
Sodium: 136 mmol/L (ref 135–145)

## 2014-03-25 MED ORDER — SULFAMETHOXAZOLE-TRIMETHOPRIM 800-160 MG PO TABS: 1.0000 | ORAL_TABLET | Freq: Two times a day (BID) | ORAL | Status: DC

## 2014-03-25 NOTE — Progress Notes (Signed)
RT went to place patient on CPAP. Patient stated that he would self administer when ready .

## 2014-03-25 NOTE — Progress Notes (Signed)
Patient d/c home,stable,denies and and SOB. Went home with his equipments BIPAP and oxygen. Discharge instructions given, verbalized understanding,teach back utilized.

## 2014-03-25 NOTE — Progress Notes (Signed)
Lab called patient's CO2 level was 40, the same as yesterday's results,encouraged patient to use  BIPAP in the rrom. RN willcontinue to monitor.

## 2014-03-25 NOTE — Care Management Note (Signed)
CARE MANAGEMENT NOTE 03/25/2014  Patient:  Douglas [name], Douglas [name]   Account Number:  [id number]  Date Initiated:  03/20/2014  Documentation initiated by:  DAVIS,RHONDA  Subjective/Objective Assessment:   29 yo male former smoker sent from pulmonary office with mental status change, hypoxia, wt gain from acute cor pulmonale in setting of OSA/OHS.     Action/Plan:   home when stable may nee c -pap or bipap   Anticipated DC Date:  03/25/2014   Anticipated DC Plan:  [place]  In-house referral  NA      DC Planning Services  CM consult      [facility] Choice  HOME HEALTH   Choice offered to / List presented to:  C-1 Patient   DME arranged  BIPAP      DME agency  [place] arranged  [place] RN      [place].   Status of service:  In process, will continue to follow Medicare Important Message given?   (If response is "NO", the following Medicare IM given date fields will be blank) Date Medicare IM given:   Medicare IM given by:   Date Additional Medicare IM given:   Additional Medicare IM given by:    Discharge Disposition:    Per UR Regulation:  Reviewed for med. necessity/level of care/duration of stay  If discussed at [place] of Stay Meetings, dates discussed:    Comments:  03/25/14 Marney Doctor RN,BSN,NCM Pt is requesting a portable 02 tank to get home.  Pt has current 02 orders with [employer] so they will deliver a portable 02 tank with the bipap machine today. No new order or qualifying saturations need to be sent per Arbie Cookey Programmer, multimedia).  03/24/14 Marney Doctor RN,BSN,NCM Spoke with Arbie Cookey from [place] again who states they will deliver pt a new bipap tomorrow here to the hospital. Pt and Laurey Arrow NP informed.  CM will continue to follow. Referral given to [facility] for [facility] services.  03/24/14 Marney Doctor RN,BSN,NCM [phone] Pt with Bipap at home prior to admission.  Per pt bipap is not working.  Bipap is  through [employer].  This CM spoke with Counselling psychologist who states that pt has had 2 bipaps and now that this one is broken they will not be fixing it for him.  Pt states that he has only had one machine and when he took this one in to be fixed  [place] states they can't fix it because it is bug infested.  Pt states he has moved since the bipap was bug infested so where he lives now there are no bugs.  [employer] branch manager called back to see what else can be done to fix his current bipap.  Arbie Cookey Programmer, multimedia) is calling her manager to figure out what else can be done.  CM will continue to follow.  Feb. 25 2016/Rhonda L. Rosana Hoes, RN, BSN, CCM. Case Management [place] [phone] No discharge needs present of time of review.

## 2014-03-25 NOTE — Discharge Summary (Signed)
Physician Discharge Summary       Patient ID: Douglas [name] MRN: [id number] DOB/AGE: 10-21-1985 29 y.o.  Admit date: 03/19/2014 Discharge date: 03/25/2014  Discharge Diagnoses:  Acute on chronic hypercarbic respiratory failure Acute cor pulmonale due to poorly controlled OSA/OHS Decompensated diastolic dysfunction  UTI  Detailed Hospital Course:  29 year old male w/ known h/o OHS and OSA. Was admitted 2/24 from our office w/ cc: confusion and 45 lb weight gain over 2 months. Reported increased LE swelling and abd bloating. Admitted for decompensated OHS/OSA/cor pulmonale. Treated in usual fashion which included: IV diuresis and supplemental oxygen. We determined one of the reasons he was not compliant w/ his BIPAP is that his no longer was working correctly. We made adjustments while in the hospital and also determined that he did best w/ auto-titration so a new auto-titration device was ordered. At time of discharge he is able to ambulate in the hall w/ out desaturation on room air. He is now ready for d/c w/ plan as outlined below.    Discharge Plan by active problems   Acute cor pulmonale 2nd to untx OSA/OHS. Acute on Chronic Hypercarbic Respiratory Failure Plan:  Home w/ O2 at 4 liters w/ activity and 5 liters bleed into BIPAP Resume home lasix Cont norvasc   Acute on chronic diastolic CHF - elevated BNP, minimal rise in troponin, EKG with RV hypertrophy Plan:  Cont lasix  Hold ace-i given rise in creatinine. Will need this re-addressed in cards clinic  UTI  P:  -change to po bactrim, complete 1 more days    Discharge Exam: BP 120/90 mmHg  Pulse 98  Temp(Src) 98.3 F (36.8 C) (Oral)  Resp 18  Ht 5' 4.5" (1.638 m)  Wt 121.11 kg (267 lb)  BMI 45.14 kg/m2  SpO2 96%  General: Morbidly obese male in NAD Neuro: Alert, moves all 4  HEENT: Nose without purulence, No skin breakdown or pressure necrosis from cpap mask Cardiovascular: Regular, no  murmur Lungs: Decreased depth of inspiration, no wheezing Abdomen: Obese, firm, non tender, decreased bowel sounds Musculoskeletal: 2+ edema Skin: Venous stasis changes  Labs at discharge Lab Results  Component Value Date   CREATININE 1.43* 03/25/2014   BUN 11 03/25/2014   NA 136 03/25/2014   K 4.4 03/25/2014   CL 86* 03/25/2014   CO2 40* 03/25/2014   Lab Results  Component Value Date   WBC 9.1 03/22/2014   HGB 16.3 03/22/2014   HCT 60.9* 03/22/2014   MCV 93.4 03/22/2014   PLT 124* 03/22/2014   Lab Results  Component Value Date   ALT 304* 03/19/2014   AST 115* 03/19/2014   ALKPHOS 86 03/19/2014   BILITOT 2.4* 03/19/2014   No results found for: INR, PROTIME  Current radiology studies No results found.  Disposition:  01-Home or Self Care      Discharge Instructions    For home use only DME oxygen    Complete by:  As directed   4 liters w/ exertion and 5 liters at night w/ bipap  Mode or (Route):  Nasal cannula  Frequency:  Continuous (stationary and portable oxygen unit needed)  Oxygen conserving device:  No  Oxygen delivery system:  Gas            Medication List    STOP taking these medications        lisinopril 5 MG tablet  Commonly known as:  PRINIVIL,ZESTRIL      TAKE these medications  albuterol 108 (90 BASE) MCG/ACT inhaler  Commonly known as:  PROVENTIL HFA;VENTOLIN HFA  Inhale 1-2 puffs into the lungs every 6 (six) hours as needed for wheezing or shortness of breath.     amLODipine 10 MG tablet  Commonly known as:  NORVASC  Take 1 tablet (10 mg total) by mouth daily.     aspirin 325 MG EC tablet  Take 1 tablet (325 mg total) by mouth daily.     furosemide 40 MG tablet  Commonly known as:  LASIX  Take 1 tablet (40 mg total) by mouth 2 (two) times daily.     sulfamethoxazole-trimethoprim 800-160 MG per tablet  Commonly known as:  BACTRIM DS,SEPTRA DS  Take 1 tablet by mouth every 12 (twelve) hours.       Follow-up  Information    Follow up with [name],[name], NP On 04/07/2014.   Specialty:  Nurse Practitioner   Why:  1130 am    Contact information:   [address] [place] [address] [phone]       Follow up with Richardson Dopp, PA-C On 04/02/2014.   Specialty:  Physician Assistant   Why:  240pm    Contact information:   [address] [address] [phone]       Discharged Condition: good  Physician Statement:   The Patient was personally examined, the discharge assessment and plan has been personally reviewed and I agree with ACNP Babcock's assessment and plan. > 30 minutes of time have been dedicated to discharge assessment, planning and discharge instructions.   SignedMarni Griffon 03/25/2014, 11:42 AM   Baltazar Apo, MD, PhD 03/26/2014, 9:10 AM [facility] [phone] or if no answer [phone]

## 2014-04-02 ENCOUNTER — Encounter: Payer: Self-pay | Admitting: Physician Assistant

## 2014-04-02 ENCOUNTER — Ambulatory Visit (INDEPENDENT_AMBULATORY_CARE_PROVIDER_SITE_OTHER): Payer: Medicaid Other | Admitting: Physician Assistant

## 2014-04-02 VITALS — BP 101/58 | HR 83 | Ht 64.5 in | Wt 267.0 lb

## 2014-04-02 DIAGNOSIS — I270 Primary pulmonary hypertension: Secondary | ICD-10-CM

## 2014-04-02 DIAGNOSIS — I5032 Chronic diastolic (congestive) heart failure: Secondary | ICD-10-CM | POA: Diagnosis not present

## 2014-04-02 DIAGNOSIS — I10 Essential (primary) hypertension: Secondary | ICD-10-CM | POA: Diagnosis not present

## 2014-04-02 DIAGNOSIS — I2781 Cor pulmonale (chronic): Secondary | ICD-10-CM

## 2014-04-02 DIAGNOSIS — G4733 Obstructive sleep apnea (adult) (pediatric): Secondary | ICD-10-CM

## 2014-04-02 DIAGNOSIS — I272 Pulmonary hypertension, unspecified: Secondary | ICD-10-CM

## 2014-04-02 DIAGNOSIS — Z9989 Dependence on other enabling machines and devices: Secondary | ICD-10-CM

## 2014-04-02 DIAGNOSIS — R7989 Other specified abnormal findings of blood chemistry: Secondary | ICD-10-CM

## 2014-04-02 DIAGNOSIS — R778 Other specified abnormalities of plasma proteins: Secondary | ICD-10-CM

## 2014-04-02 NOTE — Progress Notes (Addendum)
Cardiology Office Note   Date:  04/02/2014   ID:  Douglas [name], DOB July 04, 1985, MRN [id number]  PCP:  Philis Fendt, MD  Cardiologist:  Dr. Kirk Ruths     Chief Complaint  Patient presents with  . HIGH BLOOD PRESSURE     History of Present Illness: Douglas [name] is a 29 y.o. male with a hx of pulmonary hypertension, OSA, obesity, substance abuse (+ cocaine, + cigs), HTN, CKD and non-adherence to medical Rx.   Admitted 10/2013 with volume overload/anasarca secondary to right heart failure in the setting of cor pulmonale secondary to OSA, obesity/hypoventilation syndrome. He was diuresed with IV Lasix and lost 27 pounds. VQ scan was negative for chronic pulmonary embolism. Lower extremity Dopplers were negative for DVT. Patient was noted have proteinuria. Workup was unremarkable. He was started on ACEI and outpatient follow-up with nephrology was recommended. He was also noted to have polycythemia. He underwent therapeutic phlebotomy. Outpatient follow-up with hematology was recommended. OP FU with pulmonary was also recommended for control of OSA.   Last seen here 12/2013.  He had not been referred to nephrology or hematology. He had run out of his medications.  He was to FU in 2 weeks.  He has not been seen since.    Admitted 2/24-3/1 for hypoxic respiratory failure and acute encephalopathy due to decompensated cor pulmonale/diastolic CHF from untreated OHS/OSA.   He was diuresed and followed by CCM service.  He had been off BiPap.  Adjustments were made to his device as it was not working properly.  ACEI was held due to increased creatinine.  He had minimal rise in troponins felt to be from demand ischemia.  Echo demonstrated normal LVF, no RWMA, mod reduced RVSF.      Doing well since DC. The patient denies chest pain, shortness of breath, syncope, orthopnea, PND or significant pedal edema. Wearing BiPap nightly.  He is NYHA 2-2b.  Weights stable since DC.      Studies: - Echo (10/15): Severe LVH, EF 55-60%, moderate RVE with septal flattening suggesting cor pulmonale and significantly elevated PA pressure, mild RAE  Echo 03/20/14 - Mild LVH. EF 60-65%.  Wall motion was normal - Left atrium: The atrium was mildly dilated. - Right ventricle: The cavity size was moderately dilated. Wall thickness was normal. Systolic function was moderately reduced. - Right atrium: The atrium was severely dilated. - Pericardium, extracardiac: A trivial pericardial effusion was identified. There was no evidence of hemodynamic compromise.  Impressions: - When compared to 10/2013, right sided pressures have increased. Right ventricle remains dilated.   Past Medical History  Diagnosis Date  . Hypertension   . Obesity   . Tobacco abuse   . Substance abuse 8/31/213    cociane "first time use"  . High cholesterol   . CHF (congestive heart failure)   . Chronic bronchitis   . OSA on CPAP   . Arthritis     "hands, feet" (10/31/2013)  . Pneumonia 09/2011    Past Surgical History  Procedure Laterality Date  . No past surgeries       Current Outpatient Prescriptions  Medication Sig Dispense Refill  . albuterol (PROVENTIL HFA;VENTOLIN HFA) 108 (90 BASE) MCG/ACT inhaler Inhale 1-2 puffs into the lungs every 6 (six) hours as needed for wheezing or shortness of breath. 1 Inhaler 0  . amLODipine (NORVASC) 10 MG tablet Take 1 tablet (10 mg total) by mouth daily. 30 tablet 0  . aspirin EC 325 MG EC  tablet Take 1 tablet (325 mg total) by mouth daily. 30 tablet 0  . furosemide (LASIX) 40 MG tablet Take 1 tablet (40 mg total) by mouth 2 (two) times daily. 60 tablet 6   No current facility-administered medications for this visit.    Allergies:   Review of patient's allergies indicates no known allergies.    Social History:  The patient  reports that he quit smoking about 5 months ago. His smoking use included Cigarettes. He has a 4.5 pack-year smoking history.  He has never used smokeless tobacco. He reports that he uses illicit drugs (Cocaine). He reports that he does not drink alcohol.   Family History:  The patient's family history includes Cancer in his paternal grandfather; Heart disease in his mother; Hypertension in his father. There is no history of Heart attack or Stroke.    ROS:   Please see the history of present illness.   Review of Systems  Constitution: Negative for fever.  HENT: Positive for sore throat.   Respiratory: Positive for cough.        Non-productive  All other systems reviewed and are negative.    PHYSICAL EXAM: VS:  BP 101/58 mmHg  Pulse 83  Ht 5' 4.5" (1.638 m)  Wt 267 lb (121.11 kg)  BMI 45.14 kg/m2    Wt Readings from Last 3 Encounters:  04/02/14 267 lb (121.11 kg)  03/25/14 267 lb (121.11 kg)  03/19/14 333 lb 12.8 oz (151.411 kg)     GEN: Well nourished, well developed, in no acute distress HEENT: OP pink; poor dentition, no adenopathy Neck: no JVD, no masses Cardiac:  Normal S1/S2, RRR; no murmur, no rubs or gallops, trace edema  Respiratory:  Decreased breath sounds bilaterally, no wheezing, rhonchi or rales. GI: soft, nontender, nondistended, + BS MS: no deformity or atrophy Skin: warm and dry  Neuro:  CNs II-XII intact, Strength and sensation are intact Psych: Normal affect   EKG:  EKG is ordered today.  It demonstrates:   NSR, HR 83, rightward axis, RVH, no change from prior tracing.   Recent Labs: 10/31/2013: Pro B Natriuretic peptide (BNP) 2527.0* 03/19/2014: ALT 304*; TSH 1.532 03/21/2014: B Natriuretic Peptide 163.7*; Magnesium 1.5 03/22/2014: Hemoglobin 16.3; Platelets 124* 03/25/2014: BUN 11; Creatinine 1.43*; Potassium 4.4; Sodium 136    Recent Labs  10/31/13 1212 03/19/14 1645 03/19/14 2216 03/20/14 0401  TROPONINI  --  0.13* 0.17* 0.15*  TROPIPOC 0.03  --   --   --     Lipid Panel No results found for: CHOL, TRIG, HDL, CHOLHDL, VLDL, LDLCALC, LDLDIRECT    ASSESSMENT AND  PLAN:  Chronic diastolic CHF (congestive heart failure):  Volume stable.  Continue current dose of diuretic.  Check BMET today.  He is now off ACEI.  BP too low to resume.  Consider in the future.   Cor pulmonale:  As noted, volume stable.  Check BMET today.  Pulmonary hypertension:  FU with pulmonary.  OSA on CPAP:  FU with Pulmonary.   Essential hypertension:   BP controlled.   Elevated troponin:  There was minimal elevation in Troponins with a flat trend.  Echo demonstrated no RWMA.  I am not convinced he needs a stress test for risk stratification..  I will review with Dr. Kirk Ruths.    Current medicines are reviewed at length with the patient today.  The patient does not have concerns regarding medicines.  The following changes have been made:  no change  Labs/ tests ordered  today include:   Orders Placed This Encounter  Procedures  . Basic Metabolic Panel (BMET)  . Basic Metabolic Panel (BMET)  . EKG 12-Lead    Disposition:   FU with Dr. Kirk Ruths or me in 1 month.    Signed, Versie Starks, MHS 04/02/2014 2:59 PM    [facility] [address], [place], [place]  [address] Phone: [phone]; Fax: [phone]    ADDENDUM: Reviewed with Dr. Kirk Ruths.  Nuclear stress test is not recommended. Richardson Dopp, PA-C   04/04/2014 1:33 PM

## 2014-04-02 NOTE — Patient Instructions (Signed)
LAB WORK TODAY; BMET  FOLLOW UP WITH DR. CRENSHAW IN 1 MONTH IN THE [facility] OFFICE IF NOT AVAILABLE THEN WITH SCOTT WEAVER, PAC IN 1 MONTH AT THE [address] OFFICE

## 2014-04-03 ENCOUNTER — Other Ambulatory Visit: Payer: Medicaid Other

## 2014-04-07 ENCOUNTER — Other Ambulatory Visit: Payer: Medicaid Other

## 2014-04-07 ENCOUNTER — Encounter: Payer: Self-pay | Admitting: Adult Health

## 2014-04-07 ENCOUNTER — Ambulatory Visit (INDEPENDENT_AMBULATORY_CARE_PROVIDER_SITE_OTHER): Payer: Medicaid Other | Admitting: Adult Health

## 2014-04-07 VITALS — BP 126/86 | HR 92 | Temp 98.1°F | Ht 64.0 in | Wt 270.0 lb

## 2014-04-07 DIAGNOSIS — J9611 Chronic respiratory failure with hypoxia: Secondary | ICD-10-CM

## 2014-04-07 DIAGNOSIS — I2781 Cor pulmonale (chronic): Secondary | ICD-10-CM

## 2014-04-07 DIAGNOSIS — Z9989 Dependence on other enabling machines and devices: Secondary | ICD-10-CM

## 2014-04-07 DIAGNOSIS — G4733 Obstructive sleep apnea (adult) (pediatric): Secondary | ICD-10-CM

## 2014-04-07 NOTE — Patient Instructions (Signed)
BIPAP download.  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  Wear BIPAP At bedtime -all night  Wear Oxygen 4l/m with activity .  follow up Dr. Halford Chessman  In 6- 8 weeks and As needed

## 2014-04-07 NOTE — Progress Notes (Signed)
Chief Complaint  Patient presents with  . Follow-up    Hosp f/u; feels better since d/c; no SOB/cough/chest tightness    History of Present Illness: Douglas [name] is a 29 y.o. male with chronic respiratory failure from OSA, OHS, cor pulmonale, and diastolic CHF.  TESTS: PSG 08/14/09 >> AHI 105.4, SaO2 low 56%, BiPAP 21/17 cm H2O Echo 11/01/13 >> severe LVH, EF 55 to 60%, mod RA/RV dilation V/Q scan 11/01/13 >> upper lobe airtrapping b/l Split 02/05/14 >> AHI 78.2, SaO2 low 52%. CPAP 13 cm H2O >> AHI 5.4, still had low SaO2 >> centrals with higher CPAP/EPAP. ONO with 5 liters 02/27/14 >> test time 58 min. Basal SpO2 66%, low SpO2 50%. Spent 55 min with SpO2 < 88%.  04/07/2014 [facility] follow up  Returns for follow up from recent hosp stay.  Admitted 2/24-3/1 for acute on chronic hyercarbic/hypoxic RF with acute cor pulmonale due to poorly controlled OHS/OSA. +deconmpensated Diastolic Dysfunction.  Was not compliant with BIPAP PTA , wt was up 45lbs.  tx w/ aggressive diuresis , and BIPAP  Says he is feeling better with less dyspnea  Wt is down 60lbs since admission.  Says he is wearing BIPAP .  No chest pain,orthopnea, edema , fever. Or hemoptysis .     ROS:  Constitutional:   No  weight loss, night sweats,  Fevers, chills, + fatigue, or  lassitude.  HEENT:   No headaches,  Difficulty swallowing,  Tooth/dental problems, or  Sore throat,                No sneezing, itching, ear ache, nasal congestion, post nasal drip,   CV:  No chest pain,  Orthopnea, PND, swelling in lower extremities, anasarca, dizziness, palpitations, syncope.   GI  No heartburn, indigestion, abdominal pain, nausea, vomiting, diarrhea, change in bowel habits, loss of appetite, bloody stools.   Resp:  .  No chest wall deformity  Skin: no rash or lesions.  GU: no dysuria, change in color of urine, no urgency or frequency.  No flank pain, no hematuria   MS:  No joint pain or swelling.  No decreased  range of motion.  No back pain.  Psych:  No change in mood or affect. No depression or anxiety.  No memory loss.     EXAM :  GEN: A/Ox3; pleasant , NAD, obese  HEENT:  [place]/AT,  EACs-clear, TMs-wnl, NOSE-clear, THROAT-clear, no lesions, no postnasal drip or exudate noted.   NECK:  Supple w/ fair ROM; no JVD; normal carotid impulses w/o bruits; no thyromegaly or nodules palpated; no lymphadenopathy.  RESP  Decreased BS ,  w/o, wheezes/ rales/ or rhonchi.no accessory muscle use, no dullness to percussion  CARD:  RRR, no m/r/g  , tr-1+ peripheral edema, pulses intact, no cyanosis or clubbing.  GI:   Soft & nt; nml bowel sounds; no organomegaly or masses detected.  Musco: Warm bil, no deformities or joint swelling noted.   Neuro: alert, no focal deficits noted.    Skin: Warm, no lesions or rashes

## 2014-04-10 ENCOUNTER — Telehealth: Payer: Self-pay | Admitting: Cardiology

## 2014-04-10 NOTE — Telephone Encounter (Signed)
I left a message with Douglas [name] that that would be great to keep an eye on his fluid status.  I gave the verbal order.

## 2014-04-10 NOTE — Telephone Encounter (Signed)
New message        Pt is starting to pick up fluid in his legs again   currently on fluid pill   The nurse would like to know if she can have some more orders to go out and see the pt for a few more weeks

## 2014-04-14 NOTE — Assessment & Plan Note (Signed)
BIPAP download.  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  Wear BIPAP At bedtime -all night  Wear Oxygen 4l/m with activity .  follow up Dr. Halford Chessman  In 6- 8 weeks and As needed

## 2014-04-14 NOTE — Assessment & Plan Note (Signed)
Order sent for portable concentrator evaluation .   Wear Oxygen 4l/m with activity .  follow up Dr. Halford Chessman  In 6- 8 weeks and As needed

## 2014-04-14 NOTE — Assessment & Plan Note (Signed)
Recent decompensated -resolved with diureiss  Encouraged on medication and BIPAP compliance

## 2014-04-15 ENCOUNTER — Encounter: Payer: Self-pay | Admitting: Pulmonary Disease

## 2014-04-16 ENCOUNTER — Telehealth: Payer: Self-pay | Admitting: Cardiology

## 2014-04-16 NOTE — Telephone Encounter (Signed)
okay

## 2014-04-16 NOTE — Telephone Encounter (Signed)
Douglas [name] called wanting Dr. Stanford Breed to know that the pt was unable to be seen this week and home care will resume on next week. Pt denied any issues. Please call if need to  Thanks

## 2014-04-29 ENCOUNTER — Telehealth: Payer: Self-pay | Admitting: Cardiology

## 2014-04-29 NOTE — Telephone Encounter (Signed)
Agree with plan as outlined Haddy Mullinax  

## 2014-04-29 NOTE — Telephone Encounter (Addendum)
Beth, [facility] w/ [facility] called in to report on visit w/ pt this afternoon.   Pt wears 4L o2 PRN for SOB (usually w/ exertion). She reports pt sats were in low 80s at rest. -> 89-90% on 4L O2. Pt denies feeling short of breath.  Nurse noted cough & congestion on assessment, adventitious lung sounds.  She reports weight OK, no findings indicative of fluid overload.   I advised ER if worsening SOB, f/u today or soonest available w/ PCP or Pulmonology - he is followed by Dr. Halford Chessman at [facility].  He sees Best boy next week at [employer].

## 2014-05-05 ENCOUNTER — Encounter: Payer: Medicaid Other | Admitting: Physician Assistant

## 2014-05-05 NOTE — Progress Notes (Signed)
This encounter was created in error - please disregard.

## 2014-05-05 NOTE — Progress Notes (Deleted)
Cardiology Office Note   Date:  05/05/2014   ID:  Douglas [name], DOB 24-Jul-1985, MRN [id number]  PCP:  Douglas Fendt, MD  Cardiologist:  Dr. Kirk Ruths     Chief Complaint  Patient presents with  . Congestive Heart Failure    Follow up     History of Present Illness: Douglas [name] is a 29 y.o. male with a hx of pulmonary hypertension, OSA, obesity, substance abuse (+ cocaine, + cigs), HTN, CKD and non-adherence to medical Rx.   Admitted 10/2013 with volume overload/anasarca secondary to right sided HF in the setting of cor pulmonale secondary to OSA, obesity/hypoventilation syndrome. VQ scan was negative for chronic pulmonary embolism. Lower extremity Dopplers were negative for DVT. Patient was noted have proteinuria. Workup was unremarkable. He was started on ACEI and outpatient follow-up with nephrology was recommended. He was also noted to have polycythemia. He underwent therapeutic phlebotomy. Outpatient follow-up with hematology was recommended. OP FU with pulmonary was also recommended for control of OSA. He has never seen Nephrology or Hematology.    Admitted 2/24-3/1 for hypoxic respiratory failure and acute encephalopathy due to decompensated cor pulmonale/diastolic CHF from untreated OHS/OSA. He was diuresed and followed by CCM service. He had been off BiPap. Adjustments were made to his device as it was not working properly. ACEI was held due to increased creatinine. He had minimal rise in troponins felt to be from demand ischemia. Echo demonstrated normal LVF, no RWMA, mod reduced RVSF.No further workup was recommended.    Last seen by me 04/02/14.  He was overall stable.  He returns for FU.  ***   Studies/Reports Reviewed Today:  Echo 03/20/14 - Mild LVH. EF 60-65%. Wall motion was normal - mild LAE - Mod RVE.  Mod reduced RVSF.  - Severe RAE. - Trivial Eff  Impressions: When compared to 10/2013, right sided pressures have increased.  Right ventricle remains dilated    Past Medical History  Diagnosis Date  . Hypertension   . Obesity   . Tobacco abuse   . Substance abuse 8/31/213    cociane "first time use"  . High cholesterol   . CHF (congestive heart failure)   . Chronic bronchitis   . OSA on CPAP   . Arthritis     "hands, feet" (10/31/2013)  . Pneumonia 09/2011    Past Surgical History  Procedure Laterality Date  . No past surgeries       Current Outpatient Prescriptions  Medication Sig Dispense Refill  . albuterol (PROVENTIL HFA;VENTOLIN HFA) 108 (90 BASE) MCG/ACT inhaler Inhale 1-2 puffs into the lungs every 6 (six) hours as needed for wheezing or shortness of breath. 1 Inhaler 0  . amLODipine (NORVASC) 10 MG tablet Take 1 tablet (10 mg total) by mouth daily. 30 tablet 0  . aspirin EC 325 MG EC tablet Take 1 tablet (325 mg total) by mouth daily. 30 tablet 0  . furosemide (LASIX) 40 MG tablet Take 1 tablet (40 mg total) by mouth 2 (two) times daily. 60 tablet 6   No current facility-administered medications for this visit.    Allergies:   Review of patient's allergies indicates no known allergies.    Social History:  The patient  reports that he quit smoking about 6 months ago. His smoking use included Cigarettes. He has a 4.5 pack-year smoking history. He has never used smokeless tobacco. He reports that he uses illicit drugs (Cocaine). He reports that he does not drink alcohol.  Family History:  The patient's ***family history includes Cancer in his paternal grandfather; Heart disease in his mother; Hypertension in his father. There is no history of Heart attack or Stroke.    ROS:   Please see the history of present illness.   ROS    PHYSICAL EXAM: VS:  There were no vitals taken for this visit.    Wt Readings from Last 3 Encounters:  04/07/14 270 lb (122.471 kg)  04/02/14 267 lb (121.11 kg)  03/25/14 267 lb (121.11 kg)     GEN: Well nourished, well developed, in no acute  distress HEENT: normal Neck: *** JVD, ***carotid bruits, no masses Cardiac:  Normal S1/S2, ***RRR; *** murmur ***, *** no rubs or gallops, {NUMBERS; 1+ TO 4+, TRACE/RARE:14493} edema  Respiratory:  ***clear to auscultation bilaterally, no wheezing, rhonchi or rales. GI: ***soft, nontender, nondistended, + BS MS: no deformity or atrophy Skin: warm and dry  Neuro:  CNs II-XII intact, Strength and sensation are intact Psych: Normal affect   EKG:  EKG {ACTION; IS/IS TLX:72620355} ordered today.  It demonstrates:   ***   Recent Labs: 10/31/2013: Pro B Natriuretic peptide (BNP) 2527.0* 03/19/2014: ALT 304*; TSH 1.532 03/21/2014: B Natriuretic Peptide 163.7*; Magnesium 1.5 03/22/2014: Hemoglobin 16.3; Platelets 124* 03/25/2014: BUN 11; Creatinine 1.43*; Potassium 4.4; Sodium 136    Lipid Panel No results found for: CHOL, TRIG, HDL, CHOLHDL, VLDL, LDLCALC, LDLDIRECT    ASSESSMENT AND PLAN:  Cor pulmonale  Chronic diastolic CHF (congestive heart failure)  Pulmonary hypertension  OSA on CPAP  Essential hypertension  CKD (chronic kidney disease), unspecified stage ***   Current medicines are reviewed at length with the patient today.  The patient {ACTIONS; HAS/DOES NOT HAVE:19233} concerns regarding medicines.  The following changes have been made:  {PLAN; NO CHANGE:13088:s}  Labs/ tests ordered today include: *** No orders of the defined types were placed in this encounter.     Disposition:   FU with ***   Signed, Richardson Dopp, PA-C, MHS 05/05/2014 8:13 AM    [facility] [place], [place], [place]  [address] Phone: [phone]; Fax: [phone]

## 2014-05-12 ENCOUNTER — Telehealth: Payer: Self-pay | Admitting: Cardiology

## 2014-05-12 NOTE — Telephone Encounter (Signed)
Needs paov with Richardson Dopp Otherwise plan as outlined. Kirk Ruths

## 2014-05-12 NOTE — Telephone Encounter (Signed)
Beth, [facility] from [facility] called to notify on patient condition.  She notes weight up 10 lbs since last week, pt at 276 lbs. He reported to her 2 missed doses of Lasix over weekend.  Pt missed appt w/ Richardson Dopp - did not specify a reason. I asked pt if he could come to a new appt if offered, he states yes.  No noted change in shortness of breath, but pt is O2 dependent - Beth notes this is hard to assess. He does have observable swelling in abdomen & LE's.  Advised I would defer to Dr. Stanford Breed, see if pt needs to be seen in office soon;  suggested making up for 2 missed Lasix doses & getting BMET but will check w/ Dr. Stanford Breed for [place].

## 2014-05-12 NOTE — Telephone Encounter (Signed)
Spoke with beth, Aware of dr Jacalyn Lefevre recommendations.  Message sent to admin for scheduling.

## 2014-05-22 ENCOUNTER — Encounter: Payer: Self-pay | Admitting: Adult Health

## 2014-05-22 ENCOUNTER — Telehealth: Payer: Self-pay | Admitting: Cardiology

## 2014-05-22 NOTE — Telephone Encounter (Signed)
New Message    [facility] nurse is wanted to know if home health care is needed in the home anymore. Please give nurse a call.

## 2014-06-02 ENCOUNTER — Ambulatory Visit: Payer: Medicaid Other | Admitting: Pulmonary Disease

## 2014-06-04 NOTE — Telephone Encounter (Signed)
Error

## 2014-06-08 NOTE — Progress Notes (Signed)
Cardiology Office Note   Date:  06/09/2014   ID:  Douglas Madura., DOB Oct 01, 1985, MRN [id number]  PCP:  Philis Fendt, MD  Cardiologist:  Dr. Kirk Ruths     Chief Complaint  Patient presents with  . Congestive Heart Failure     History of Present Illness: Douglas [name]. is a 29 y.o. male with a hx of pulmonary hypertension, OSA, obesity, substance abuse (+ cocaine, + cigs), HTN, CKD and non-adherence to medical Rx.   Admitted 10/2013 with volume overload/anasarca secondary to right heart failure in the setting of cor pulmonale secondary to OSA, obesity/hypoventilation syndrome. He was diuresed with IV Lasix and lost 27 pounds. VQ scan was negative for chronic pulmonary embolism. Lower extremity Dopplers were negative for DVT. Patient was noted have proteinuria. Workup was unremarkable. He was started on ACEI and outpatient follow-up with nephrology was recommended. He was also noted to have polycythemia. He underwent therapeutic phlebotomy. Outpatient follow-up with hematology was recommended. OP FU with pulmonary was also recommended for control of OSA.   Admitted 02/2014 for hypoxic respiratory failure and acute encephalopathy due to decompensated cor pulmonale/diastolic CHF from untreated OHS/OSA.   He was diuresed and followed by CCM service.  He had been off BiPap.  Adjustments were made to his device as it was not working properly.  ACEI was held due to increased creatinine.  He had minimal rise in troponins felt to be from demand ischemia.  Echo demonstrated normal LVF, no RWMA, mod reduced RVSF.      Last seen here 04/02/14.  I reviewed his case with Dr. Kirk Ruths.  We did not feel that he needed a nuclear stress test.  He returns for FU.    The patient tells me that over the last couple weeks he's started to feel more confused. He says that his home health agency recently contacted him and told him that his CPAP may be leaking. He denies increasing  shortness of breath. He denies chest pain. His biggest complaint is significant fatigue. He states "I am sleeping a lot." He denies orthopnea or LE edema. He denies coughing or wheezing. He denies syncope. His sister is with him today and she notes that his mentation is slowed.   Studies: - Echo (10/15): Severe LVH, EF 55-60%, moderate RVE with septal flattening suggesting cor pulmonale and significantly elevated PA pressure, mild RAE  Echo 03/20/14 - Mild LVH. EF 60-65%.  Wall motion was normal - Left atrium: The atrium was mildly dilated. - Right ventricle: The cavity size was moderately dilated. Wall thickness was normal. Systolic function was moderately reduced. - Right atrium: The atrium was severely dilated. - Pericardium, extracardiac: A trivial pericardial effusion was identified. There was no evidence of hemodynamic compromise.  Impressions: - When compared to 10/2013, right sided pressures have increased. Right ventricle remains dilated.   Past Medical History  Diagnosis Date  . Hypertension   . Obesity   . Tobacco abuse   . Substance abuse 8/31/213    cociane "first time use"  . High cholesterol   . CHF (congestive heart failure)   . Chronic bronchitis   . OSA on CPAP   . Arthritis     "hands, feet" (10/31/2013)  . Pneumonia 09/2011    Past Surgical History  Procedure Laterality Date  . No past surgeries       Current Outpatient Prescriptions  Medication Sig Dispense Refill  . albuterol (PROVENTIL HFA;VENTOLIN HFA) 108 (90 BASE) MCG/ACT  inhaler Inhale 1-2 puffs into the lungs every 6 (six) hours as needed for wheezing or shortness of breath. 1 Inhaler 0  . amLODipine (NORVASC) 10 MG tablet Take 1 tablet (10 mg total) by mouth daily. 30 tablet 0  . aspirin EC 325 MG EC tablet Take 1 tablet (325 mg total) by mouth daily. 30 tablet 0  . furosemide (LASIX) 40 MG tablet Take 1 tablet (40 mg total) by mouth 2 (two) times daily. 60 tablet 6   No current  facility-administered medications for this visit.    Allergies:   Review of patient's allergies indicates no known allergies.    Social History:  The patient  reports that he quit smoking about 7 months ago. His smoking use included Cigarettes. He has a 4.5 pack-year smoking history. He has never used smokeless tobacco. He reports that he uses illicit drugs (Cocaine). He reports that he does not drink alcohol.   Family History:  The patient's family history includes Cancer in his paternal grandfather; Heart disease in his mother; Hypertension in his father. There is no history of Heart attack or Stroke.    ROS:   Please see the history of present illness.   Review of Systems  Constitution: Positive for chills, decreased appetite, diaphoresis, malaise/fatigue and weight gain.  Cardiovascular: Positive for dyspnea on exertion.  Neurological: Positive for dizziness.  All other systems reviewed and are negative.    PHYSICAL EXAM: VS:  BP 130/82 mmHg  Pulse 103  Ht 5\' 4"  (1.626 m)  Wt 287 lb 1.9 oz (130.237 kg)  BMI 49.26 kg/m2  SpO2 83%    Wt Readings from Last 3 Encounters:  06/09/14 287 lb 1.9 oz (130.237 kg)  04/07/14 270 lb (122.471 kg)  04/02/14 267 lb (121.11 kg)     GEN: Well nourished, well developed, in no acute distress Neck: no JVD, no masses Cardiac:  Normal S1/S2, RRR; no murmur, no rubs or gallops, very trace bilateral LE edema  Respiratory:  Decreased breath sounds bilaterally, no wheezing, rhonchi or rales. GI: soft, nontender, nondistended, + BS MS: no deformity or atrophy Skin: warm and dry  Neuro:  Speech is slowed and he is difficult to understand at times; no focal deficits.  He is AAOx3. Psych: Normal affect   EKG:  EKG is ordered today.  It demonstrates:   Sinus tachycardia, right axis deviation, HR 103, no change from prior tracing   Recent Labs: 10/31/2013: Pro B Natriuretic peptide (BNP) 2527.0* 03/19/2014: ALT 304*; TSH 1.532 03/21/2014: B  Natriuretic Peptide 163.7*; Magnesium 1.5 03/22/2014: Hemoglobin 16.3; Platelets 124* 03/25/2014: BUN 11; Creatinine 1.43*; Potassium 4.4; Sodium 136    Lipid Panel No results found for: CHOL, TRIG, HDL, CHOLHDL, VLDL, LDLCALC, LDLDIRECT    ASSESSMENT AND PLAN:  Acute Hypoxic Respiratory Failure in the setting of OSA/OHS, Pulmonary HTN, Cor Pulmonale: I am concerned that his hypoxia is being driven be inadequate BiPAP.  He was 83% in our office on 4 LPM.  He is having symptoms of encephalopathy and has presented to the hospital like this in the past.  I called Dr. Halford Chessman to discuss his case.  He questions if the patient has significant hypercarbia. We are concerned that the patient's respiratory drive may be depressed enough that he would experience apnea. Therefore, we have recommended admission to the hospital. He will be sent to the emergency room now to be seen by the ER physician and then admitted by the hospitalist service.  I discussed this with  the patient. His sister, who is with him today, will drive him to the emergency room now.  Chronic diastolic CHF (congestive heart failure):   His weight is up. He does not look particularly volume overloaded. I suspect he will require diuresis as well. Our service can certainly follow along if needed.  Essential hypertension:   Controlled.   Current medicines are reviewed at length with the patient today.  Any concerns are as outlined above.  The following changes have been made:    None    Labs/ tests ordered today include:   No orders of the defined types were placed in this encounter.    Disposition:   Patient will go to the ED now for admission.   Signed, Versie Starks, MHS 06/09/2014 10:11 AM    [facility] [address], [place], [place]  [address] Phone: [phone]; Fax: [phone]

## 2014-06-09 ENCOUNTER — Encounter (HOSPITAL_COMMUNITY): Payer: Self-pay | Admitting: Emergency Medicine

## 2014-06-09 ENCOUNTER — Ambulatory Visit (INDEPENDENT_AMBULATORY_CARE_PROVIDER_SITE_OTHER): Payer: Medicaid Other | Admitting: Physician Assistant

## 2014-06-09 ENCOUNTER — Inpatient Hospital Stay (HOSPITAL_COMMUNITY)
Admission: EM | Admit: 2014-06-09 | Discharge: 2014-06-12 | DRG: 291 | Disposition: A | Discharge status: Home / Self Care | Payer: Medicaid Other | Attending: Internal Medicine | Admitting: Internal Medicine

## 2014-06-09 ENCOUNTER — Emergency Department (HOSPITAL_COMMUNITY): Payer: Medicaid Other

## 2014-06-09 ENCOUNTER — Encounter: Payer: Self-pay | Admitting: Physician Assistant

## 2014-06-09 VITALS — BP 130/82 | HR 103 | Ht 64.0 in | Wt 287.1 lb

## 2014-06-09 DIAGNOSIS — E780 Pure hypercholesterolemia: Secondary | ICD-10-CM | POA: Diagnosis present

## 2014-06-09 DIAGNOSIS — I10 Essential (primary) hypertension: Secondary | ICD-10-CM

## 2014-06-09 DIAGNOSIS — Z8701 Personal history of pneumonia (recurrent): Secondary | ICD-10-CM | POA: Diagnosis not present

## 2014-06-09 DIAGNOSIS — G9340 Encephalopathy, unspecified: Secondary | ICD-10-CM | POA: Diagnosis present

## 2014-06-09 DIAGNOSIS — I270 Primary pulmonary hypertension: Secondary | ICD-10-CM | POA: Diagnosis not present

## 2014-06-09 DIAGNOSIS — M1990 Unspecified osteoarthritis, unspecified site: Secondary | ICD-10-CM | POA: Diagnosis present

## 2014-06-09 DIAGNOSIS — J9601 Acute respiratory failure with hypoxia: Secondary | ICD-10-CM | POA: Diagnosis not present

## 2014-06-09 DIAGNOSIS — Z7982 Long term (current) use of aspirin: Secondary | ICD-10-CM

## 2014-06-09 DIAGNOSIS — J9622 Acute and chronic respiratory failure with hypercapnia: Secondary | ICD-10-CM | POA: Diagnosis present

## 2014-06-09 DIAGNOSIS — R0902 Hypoxemia: Secondary | ICD-10-CM | POA: Diagnosis not present

## 2014-06-09 DIAGNOSIS — Z6841 Body Mass Index (BMI) 40.0 and over, adult: Secondary | ICD-10-CM | POA: Diagnosis not present

## 2014-06-09 DIAGNOSIS — Z9981 Dependence on supplemental oxygen: Secondary | ICD-10-CM

## 2014-06-09 DIAGNOSIS — I272 Other secondary pulmonary hypertension: Secondary | ICD-10-CM | POA: Diagnosis present

## 2014-06-09 DIAGNOSIS — R002 Palpitations: Secondary | ICD-10-CM | POA: Diagnosis present

## 2014-06-09 DIAGNOSIS — I5032 Chronic diastolic (congestive) heart failure: Secondary | ICD-10-CM

## 2014-06-09 DIAGNOSIS — J9602 Acute respiratory failure with hypercapnia: Secondary | ICD-10-CM

## 2014-06-09 DIAGNOSIS — G4733 Obstructive sleep apnea (adult) (pediatric): Secondary | ICD-10-CM

## 2014-06-09 DIAGNOSIS — I248 Other forms of acute ischemic heart disease: Secondary | ICD-10-CM | POA: Diagnosis present

## 2014-06-09 DIAGNOSIS — E662 Morbid (severe) obesity with alveolar hypoventilation: Secondary | ICD-10-CM | POA: Diagnosis present

## 2014-06-09 DIAGNOSIS — F1910 Other psychoactive substance abuse, uncomplicated: Secondary | ICD-10-CM | POA: Diagnosis present

## 2014-06-09 DIAGNOSIS — I5033 Acute on chronic diastolic (congestive) heart failure: Secondary | ICD-10-CM | POA: Diagnosis present

## 2014-06-09 DIAGNOSIS — J811 Chronic pulmonary edema: Secondary | ICD-10-CM

## 2014-06-09 DIAGNOSIS — I2781 Cor pulmonale (chronic): Secondary | ICD-10-CM

## 2014-06-09 DIAGNOSIS — D751 Secondary polycythemia: Secondary | ICD-10-CM | POA: Diagnosis present

## 2014-06-09 DIAGNOSIS — J42 Unspecified chronic bronchitis: Secondary | ICD-10-CM | POA: Diagnosis present

## 2014-06-09 DIAGNOSIS — Z87891 Personal history of nicotine dependence: Secondary | ICD-10-CM | POA: Diagnosis not present

## 2014-06-09 DIAGNOSIS — J9621 Acute and chronic respiratory failure with hypoxia: Secondary | ICD-10-CM | POA: Diagnosis present

## 2014-06-09 DIAGNOSIS — Z9989 Dependence on other enabling machines and devices: Secondary | ICD-10-CM

## 2014-06-09 LAB — BASIC METABOLIC PANEL
ANION GAP: 9 (ref 5–15)
BUN: 14 mg/dL (ref 6–20)
CALCIUM: 8.7 mg/dL — AB (ref 8.9–10.3)
CHLORIDE: 85 mmol/L — AB (ref 101–111)
CO2: 42 mmol/L — ABNORMAL HIGH (ref 22–32)
CREATININE: 1.16 mg/dL (ref 0.61–1.24)
GFR calc non Af Amer: >60 mL/min (ref >60)
Glucose, Bld: 93 mg/dL (ref 65–99)
Potassium: 4.5 mmol/L (ref 3.5–5.1)
SODIUM: 136 mmol/L (ref 135–145)

## 2014-06-09 LAB — CBC
HEMATOCRIT: 65 % — AB (ref 39.0–52.0)
HEMOGLOBIN: 18 g/dL — AB (ref 13.0–17.0)
MCH: 25.4 pg — ABNORMAL LOW (ref 26.0–34.0)
MCHC: 27.7 g/dL — ABNORMAL LOW (ref 30.0–36.0)
MCV: 91.8 fL (ref 78.0–100.0)
Platelets: 192 10*3/uL (ref 150–400)
RBC: 7.08 MIL/uL — AB (ref 4.22–5.81)
RDW: 19.9 % — ABNORMAL HIGH (ref 11.5–15.5)
WBC: 8.6 10*3/uL (ref 4.0–10.5)

## 2014-06-09 LAB — RAPID URINE DRUG SCREEN, HOSP PERFORMED
AMPHETAMINES: NOT DETECTED
Barbiturates: NOT DETECTED
Benzodiazepines: NOT DETECTED
Cocaine: NOT DETECTED
Opiates: NOT DETECTED
Tetrahydrocannabinol: NOT DETECTED

## 2014-06-09 LAB — HEPATIC FUNCTION PANEL
ALBUMIN: 2.5 g/dL — AB (ref 3.5–5.0)
ALT: 16 U/L — ABNORMAL LOW (ref 17–63)
AST: 21 U/L (ref 15–41)
Alkaline Phosphatase: 55 U/L (ref 38–126)
BILIRUBIN TOTAL: 1.1 mg/dL (ref 0.3–1.2)
Bilirubin, Direct: 0.3 mg/dL (ref 0.1–0.5)
Indirect Bilirubin: 0.8 mg/dL (ref 0.3–0.9)
TOTAL PROTEIN: 5.9 g/dL — AB (ref 6.5–8.1)

## 2014-06-09 LAB — I-STAT ARTERIAL BLOOD GAS, ED
Acid-Base Excess: 13 mmol/L — ABNORMAL HIGH (ref 0.0–2.0)
Bicarbonate: 45.9 mEq/L — ABNORMAL HIGH (ref 20.0–24.0)
O2 SAT: 92 %
PO2 ART: 73 mmHg — AB (ref 80.0–100.0)
Patient temperature: 98.6
TCO2: 48 mmol/L (ref 0–100)
pCO2 arterial: 85 mmHg (ref 35.0–45.0)
pH, Arterial: 7.34 — ABNORMAL LOW (ref 7.350–7.450)

## 2014-06-09 LAB — URINALYSIS, ROUTINE W REFLEX MICROSCOPIC
GLUCOSE, UA: NEGATIVE mg/dL
KETONES UR: NEGATIVE mg/dL
Leukocytes, UA: NEGATIVE
Nitrite: NEGATIVE
Specific Gravity, Urine: 1.025 (ref 1.005–1.030)
Urobilinogen, UA: 1 mg/dL (ref 0.0–1.0)
pH: 5.5 (ref 5.0–8.0)

## 2014-06-09 LAB — D-DIMER, QUANTITATIVE (NOT AT ARMC): D-Dimer, Quant: <0.27 ug/mL-FEU (ref 0.00–0.48)

## 2014-06-09 LAB — URINE MICROSCOPIC-ADD ON

## 2014-06-09 LAB — TROPONIN I: Troponin I: 0.03 ng/mL (ref <0.031)

## 2014-06-09 LAB — TSH: TSH: 1.44 u[IU]/mL (ref 0.350–4.500)

## 2014-06-09 LAB — AMMONIA: AMMONIA: 78 umol/L — AB (ref 9–35)

## 2014-06-09 LAB — BRAIN NATRIURETIC PEPTIDE: B Natriuretic Peptide: 347.5 pg/mL — ABNORMAL HIGH (ref 0.0–100.0)

## 2014-06-09 MED ORDER — SODIUM CHLORIDE 0.9 % IJ SOLN
3.0000 mL | Freq: Two times a day (BID) | INTRAMUSCULAR | Status: DC
  Administered 2014-06-09 – 2014-06-12 (×5): 3 mL via INTRAVENOUS

## 2014-06-09 MED ORDER — ACETAMINOPHEN 650 MG RE SUPP: 650.0000 mg | Freq: Four times a day (QID) | RECTAL | Status: DC | PRN

## 2014-06-09 MED ORDER — ONDANSETRON HCL 4 MG/2ML IJ SOLN: 4.0000 mg | Freq: Four times a day (QID) | INTRAMUSCULAR | Status: DC | PRN

## 2014-06-09 MED ORDER — ONDANSETRON HCL 4 MG PO TABS: 4.0000 mg | ORAL_TABLET | Freq: Four times a day (QID) | ORAL | Status: DC | PRN

## 2014-06-09 MED ORDER — FUROSEMIDE 10 MG/ML IJ SOLN
40.0000 mg | Freq: Two times a day (BID) | INTRAMUSCULAR | Status: AC
  Administered 2014-06-09 – 2014-06-10 (×2): 40 mg via INTRAVENOUS

## 2014-06-09 MED ORDER — ACETAMINOPHEN 325 MG PO TABS: 650.0000 mg | ORAL_TABLET | Freq: Four times a day (QID) | ORAL | Status: DC | PRN

## 2014-06-09 MED ORDER — ENOXAPARIN SODIUM 40 MG/0.4ML ~~LOC~~ SOLN
40.0000 mg | SUBCUTANEOUS | Status: DC
  Administered 2014-06-09 – 2014-06-11 (×3): 40 mg via SUBCUTANEOUS
  Filled 2014-06-09 (×5): qty 0.4

## 2014-06-09 MED ORDER — ASPIRIN EC 325 MG PO TBEC
325.0000 mg | DELAYED_RELEASE_TABLET | Freq: Every day | ORAL | Status: DC
  Administered 2014-06-09 – 2014-06-12 (×4): 325 mg via ORAL
  Filled 2014-06-09 (×5): qty 1

## 2014-06-09 MED ORDER — ALBUTEROL SULFATE (2.5 MG/3ML) 0.083% IN NEBU
2.5000 mg | INHALATION_SOLUTION | Freq: Four times a day (QID) | RESPIRATORY_TRACT | Status: DC | PRN
Start: 2014-06-09 — End: 2014-06-12

## 2014-06-09 NOTE — ED Provider Notes (Signed)
CSN: [id number]     Arrival date & time 06/09/14  1102 History   First MD Initiated Contact with Patient 06/09/14 1126     Chief Complaint  Patient presents with  . Respiratory Distress     (Consider location/radiation/quality/duration/timing/severity/associated sxs/prior Treatment) HPI complains of shortness of breath and generalized weakness for 2 weeks, progressively worsening. Nothing makes symptoms better or worse. Patient seen at pulmonary office this morning. Sent here for evaluation and admission. Noted to be hypoxic on his home oxygen of 4 L nasal cannula this morning while at pulmonary office. Denies pain denies cough denies fever no other associated symptoms  Past Medical History  Diagnosis Date  . Hypertension   . Obesity   . Tobacco abuse   . Substance abuse 8/31/213    cociane "first time use"  . High cholesterol   . CHF (congestive heart failure)   . Chronic bronchitis   . OSA on CPAP   . Arthritis     "hands, feet" (10/31/2013)  . Pneumonia 09/2011   Past Surgical History  Procedure Laterality Date  . No past surgeries     Family History  Problem Relation Age of Onset  . Heart disease Mother     Pacemaker  . Heart attack Neg Hx   . Stroke Neg Hx   . Hypertension Father   . Cancer Paternal Grandfather    History  Substance Use Topics  . Smoking status: Former Smoker -- 0.50 packs/day for 9 years    Types: Cigarettes    Quit date: 10/31/2013  . Smokeless tobacco: Never Used  . Alcohol Use: No  No drug use   Review of Systems  Respiratory: Positive for shortness of breath.   Neurological: Positive for weakness.       Generalized weakness      Allergies  Review of patient's allergies indicates no known allergies.  Home Medications   Prior to Admission medications   Medication Sig Start Date End Date Taking? Authorizing Provider  albuterol (PROVENTIL HFA;VENTOLIN HFA) 108 (90 BASE) MCG/ACT inhaler Inhale 1-2 puffs into the lungs every 6 (six)  hours as needed for wheezing or shortness of breath. 11/05/13   Shanker Kristeen Mans, MD  amLODipine (NORVASC) 10 MG tablet Take 1 tablet (10 mg total) by mouth daily. 11/05/13   Shanker Kristeen Mans, MD  aspirin EC 325 MG EC tablet Take 1 tablet (325 mg total) by mouth daily. 11/05/13   Shanker Kristeen Mans, MD  furosemide (LASIX) 40 MG tablet Take 1 tablet (40 mg total) by mouth 2 (two) times daily. 01/27/14   Evelina Bucy, MD   BP 123/87 mmHg  Pulse 97  Temp(Src) 98.8 F (37.1 C) (Oral)  Resp 18  Ht 5\' 4"  (1.626 m)  Wt 287 lb 2 oz (130.239 kg)  BMI 49.26 kg/m2  SpO2 95% Physical Exam  Constitutional:  Chronically ill-appearing  HENT:  Head: Normocephalic and atraumatic.  Eyes: Conjunctivae are normal. Pupils are equal, round, and reactive to light.  Neck: Neck supple. No tracheal deviation present. No thyromegaly present.  Cardiovascular: Normal rate and regular rhythm.   No murmur heard. Pulmonary/Chest: Effort normal and breath sounds normal.  Abdominal: Soft. Bowel sounds are normal. He exhibits no distension. There is no tenderness.  Musculoskeletal: Normal range of motion. He exhibits no edema or tenderness.  Neurological: He is alert. Coordination normal.  Skin: Skin is warm and dry. No rash noted.  Psychiatric: He has a normal mood and affect.  Nursing note  and vitals reviewed.   ED Course  Procedures (including critical care time) Labs Review Labs Reviewed  BASIC METABOLIC PANEL  CBC  TROPONIN I    Imaging Review No results found.   EKG Interpretation   Date/Time:  Monday Jun 09 2014 11:30:46 EDT Ventricular Rate:  94 PR Interval:  167 QRS Duration: 83 QT Interval:  374 QTC Calculation: 468 R Axis:   166 Text Interpretation:  Sinus rhythm Low voltage, precordial leads Right  ventricular hypertrophy Borderline T abnormalities, diffuse leads No  significant change since last tracing Confirmed by Winfred Leeds  MD, Adalyna Godbee  [phone]) on 06/09/2014 11:40:00 AM     chest  x-ray viewed by me . Results for orders placed or performed during the hospital encounter of 03/88/82  Basic metabolic panel  (if pt has PMH of COPD)  Result Value Ref Range   Sodium 136 135 - 145 mmol/L   Potassium 4.5 3.5 - 5.1 mmol/L   Chloride 85 (L) 101 - 111 mmol/L   CO2 42 (H) 22 - 32 mmol/L   Glucose, Bld 93 65 - 99 mg/dL   BUN 14 6 - 20 mg/dL   Creatinine, Ser 1.16 0.61 - 1.24 mg/dL   Calcium 8.7 (L) 8.9 - 10.3 mg/dL   GFR calc non Af Amer >60 >60 mL/min   GFR calc Af Amer >60 >60 mL/min   Anion gap 9 5 - 15  CBC  (if pt has PMH of COPD)  Result Value Ref Range   WBC 8.6 4.0 - 10.5 K/uL   RBC 7.08 (H) 4.22 - 5.81 MIL/uL   Hemoglobin 18.0 (H) 13.0 - 17.0 g/dL   HCT 65.0 (H) 39.0 - 52.0 %   MCV 91.8 78.0 - 100.0 fL   MCH 25.4 (L) 26.0 - 34.0 pg   MCHC 27.7 (L) 30.0 - 36.0 g/dL   RDW 19.9 (H) 11.5 - 15.5 %   Platelets 192 150 - 400 K/uL  Troponin I  (if patient has PMH of COPD)  Result Value Ref Range   Troponin I <0.03 <0.031 ng/mL  D-dimer, quantitative  Result Value Ref Range   D-Dimer, Quant <0.27 0.00 - 0.48 ug/mL-FEU  I-Stat arterial blood gas, ED  Result Value Ref Range   pH, Arterial 7.340 (L) 7.350 - 7.450   pCO2 arterial 85.0 (HH) 35.0 - 45.0 mmHg   pO2, Arterial 73.0 (L) 80.0 - 100.0 mmHg   Bicarbonate 45.9 (H) 20.0 - 24.0 mEq/L   TCO2 48 0 - 100 mmol/L   O2 Saturation 92.0 %   Acid-Base Excess 13.0 (H) 0.0 - 2.0 mmol/L   Patient temperature 98.6 F    Collection site RADIAL, ALLEN'S TEST ACCEPTABLE    Drawn by Operator    Sample type ARTERIAL    Comment MD NOTIFIED, REPEAT TEST    Dg Chest 2 View (if Patient Has Fever And/or Copd)  06/09/2014   CLINICAL DATA:  Respiratory distress.  Shortness of breath.  EXAM: CHEST  2 VIEW  COMPARISON:  03/19/2014  FINDINGS: Bilateral diffuse interstitial thickening. No pleural effusion or pneumothorax. No focal consolidation. Stable cardiomegaly. Enlargement of the central pulmonary vasculature. No acute osseous  abnormality.  IMPRESSION: Findings most consistent with mild CHF.   Electronically Signed   By: Kathreen Devoid   On: 06/09/2014 12:00     1:10 PM patient is resting comfortably on oxygen 5 L via nasal cannula. ABG was obtained on 5 L oxygen  MDM  Patient does have increased oxygen  requirement. I spoke with Dr. Carles Collet who will arrange for inpatient stay. Patient not exhibiting signs of acute encephalopathy. Respiratory failure appears to be chronic from today's blood gas  Diagnosis #1 hypoxemia  #2 congestive heart failure  Final diagnoses:  None        Orlie Dakin, MD 06/09/14 1313

## 2014-06-09 NOTE — Patient Instructions (Signed)
YOU HAVE BEEN INSTRUCTED TO GO TO [facility] ED FOR ADMISSION

## 2014-06-09 NOTE — Progress Notes (Signed)
BiPAP/CPAP is set up in pt's room, RT informed pt to call for RT when ready to go on for the evening

## 2014-06-09 NOTE — ED Notes (Addendum)
Pt continues to remove nasal cannula bc says burning; respiratory called to add humidity. Pt's sats drop to 78% when removed; pt encouraged to place back in and informed going to help him.

## 2014-06-09 NOTE — ED Notes (Signed)
Patient transported to X-ray 

## 2014-06-09 NOTE — ED Notes (Signed)
Ready for transport, preparing to leave the ED for 3E; Pulmonary MD's came in room and requested to assess prior to transporting to floor.

## 2014-06-09 NOTE — Consult Note (Signed)
Name: Douglas [name]. MRN: [id number] DOB: [birth year]    ADMISSION DATE:  06/09/2014 CONSULTATION DATE:  5/16  REFERRING MD :  Dr. Carles Collet [facility]  CHIEF COMPLAINT:  Dyspnea  BRIEF PATIENT DESCRIPTION: 29 year old male with PMH CHF and OSA/OHS (VS patient) presented [facility] ED 5/16 with somnolence and dyspnea. Found to by hypoxemic in ED. Weights have been increased recently with no acute findings on CXR. Started on nasal canula O2, lasix and admitted to [facility]. PCCM consulted for further eval.   SIGNIFICANT EVENTS  2/24-3/1 admitted for acute on chronic hyercarbic/hypoxic RF with acute cor pulmonale due to poorly controlled OHS/OSA, dCHF.  STUDIES:  PSG 08/14/09 >> AHI 105.4, SaO2 low 56%, BiPAP 21/17 cm H2O Echo 11/01/13 >> severe LVH, EF 55 to 60%, mod RA/RV dilation V/Q scan 11/01/13 >> upper lobe airtrapping b/l Split 02/05/14 >> AHI 78.2, SaO2 low 52%. CPAP 13 cm H2O >> AHI 5.4, still had low SaO2 >> centrals with higher CPAP/EPAP. ONO with 5 liters 02/27/14 >> test time 58 min. Basal SpO2 66%, low SpO2 50%. Spent 55 min with SpO2 < 88%.  HISTORY OF PRESENT ILLNESS:  29 year old male with PMH as below, which is significant for dCHF, chronic respiratory failure due to OSA/PHS (VS patient, on home O2, BiPAP), HTN, and substance abuse. He was recently admitted to [facility] for decompensated OHS/OSA, and diastolic CHF exacerbation. Since that time he has followed up in pulmonary office and was doing well. He had significant weight loss and reports compliance with his BiPAP, which he had not been in the past. 5/16 he presented to cardiology office for routine OV and was found to be hypoxemic (83%) and was somnolent. He was sent to emergency department for further evaluation. In ED he was found to be somnolent and hypoxemic. CXR with vascular congestion. He was placed on low flow nasal cannula with improvement of sats into high 90s. Patient reported that he does not weigh himself or follow a diet. ABG with  slight resp acidosis. PCCM consulted for further eval.   PAST MEDICAL HISTORY :   has a past medical history of Hypertension; Obesity; Tobacco abuse; Substance abuse (8/31/213); High cholesterol; CHF (congestive heart failure); Chronic bronchitis; OSA on CPAP; Arthritis; and Pneumonia (09/2011).  has past surgical history that includes No past surgeries. Prior to Admission medications   Medication Sig Start Date End Date Taking? Authorizing Provider  albuterol (PROVENTIL HFA;VENTOLIN HFA) 108 (90 BASE) MCG/ACT inhaler Inhale 1-2 puffs into the lungs every 6 (six) hours as needed for wheezing or shortness of breath. 11/05/13  Yes Shanker Kristeen Mans, MD  amLODipine (NORVASC) 10 MG tablet Take 1 tablet (10 mg total) by mouth daily. 11/05/13  Yes Shanker Kristeen Mans, MD  aspirin EC 325 MG EC tablet Take 1 tablet (325 mg total) by mouth daily. 11/05/13  Yes Shanker Kristeen Mans, MD  furosemide (LASIX) 40 MG tablet Take 1 tablet (40 mg total) by mouth 2 (two) times daily. 01/27/14  Yes Evelina Bucy, MD   No Known Allergies  FAMILY HISTORY:  family history includes Cancer in his paternal grandfather; Heart disease in his mother; Hypertension in his father. There is no history of Heart attack or Stroke. SOCIAL HISTORY:  reports that he quit smoking about 7 months ago. His smoking use included Cigarettes. He has a 4.5 pack-year smoking history. He has never used smokeless tobacco. He reports that he uses illicit drugs (Cocaine). He reports that he does not drink  alcohol.  REVIEW OF SYSTEMS:   Bolds are positive  Constitutional: weight loss, gain, night sweats, Fevers, chills, fatigue .  HEENT: headaches, Sore throat, sneezing, nasal congestion, post nasal drip, Difficulty swallowing, Tooth/dental problems, visual complaints visual changes, ear ache CV:  chest pain, radiates: ,Orthopnea, PND, swelling in lower extremities, dizziness, palpitations, syncope.  GI  heartburn, indigestion, abdominal pain, nausea,  vomiting, diarrhea, change in bowel habits, loss of appetite, bloody stools.  Resp: cough, productive: , hemoptysis, dyspnea, chest pain, pleuritic.  Skin: rash or itching or icterus GU: dysuria, change in color of urine, urgency or frequency. flank pain, hematuria  MS: joint pain or swelling. decreased range of motion  Psych: change in mood or affect. depression or anxiety.  Neuro: difficulty with speech, weakness, numbness, ataxia   SUBJECTIVE:   VITAL SIGNS: Temp:  [98.8 F (37.1 C)] 98.8 F (37.1 C) (05/16 1119) Pulse Rate:  [89-103] 94 (05/16 1400) Resp:  [18-19] 19 (05/16 1400) BP: (106-130)/(58-87) 106/75 mmHg (05/16 1400) SpO2:  [83 %-96 %] 96 % (05/16 1400) FiO2 (%):  [40 %] 40 % (05/16 1127) Weight:  [130.237 kg (287 lb 1.9 oz)-130.239 kg (287 lb 2 oz)] 130.239 kg (287 lb 2 oz) (05/16 1119)  PHYSICAL EXAMINATION: General:  Obese male in NAD Neuro:  Alert, oriented, non-focal HEENT:  [place]/AT, no JVD noted, PERRL Cardiovascular:  RRR, no MRG Lungs:  Clear bilateral breath sounds Abdomen:  Soft, non-tender, obese Musculoskeletal:  No acute deformity or ROM limitation. Trace edema. Skin:  Grossly intact   Recent Labs Lab 06/09/14 1143  NA 136  K 4.5  CL 85*  CO2 42*  BUN 14  CREATININE 1.16  GLUCOSE 93    Recent Labs Lab 06/09/14 1143  HGB 18.0*  HCT 65.0*  WBC 8.6  PLT 192   Dg Chest 2 View (if Patient Has Fever And/or Copd)  06/09/2014   CLINICAL DATA:  Respiratory distress.  Shortness of breath.  EXAM: CHEST  2 VIEW  COMPARISON:  03/19/2014  FINDINGS: Bilateral diffuse interstitial thickening. No pleural effusion or pneumothorax. No focal consolidation. Stable cardiomegaly. Enlargement of the central pulmonary vasculature. No acute osseous abnormality.  IMPRESSION: Findings most consistent with mild CHF.   Electronically Signed   By: Kathreen Devoid   On: 06/09/2014 12:00    ASSESSMENT / PLAN:  Acute on chronic respiratory failure. Multifactorial in  setting of below OSA/OHS acute decompensation Acute on chronic diastolic CHF - Supplemental O2 to keep SpO2 88-94% (on 4 L/min at home at rest) - BiPAP QHS as at home - Agree with aggressive diuresis - Daily weights, strict I&O - Incentive spirometry - Consider echocardiogram to r/o pericarditis with low voltage ECG - Will need pulmonary eval as outpatient for re-titration of BiPAP (Dr. Halford Chessman)  Georgann Housekeeper, AGACNP-BC [facility] Pulmonology/Critical Care Pager [phone] or [phone]  06/09/2014 2:53 PM    STAFF NOTE: Linwood Dibbles, MD FACP have personally reviewed patient's available data, including medical history, events of note, physical examination and test results as part of my evaluation. I have discussed with resident/NP and other care providers such as pharmacist, RN and RRT. In addition, I personally evaluated patient and elicited key findings of: pt now awake, coarse BS, crackles, pcxr c/w edema, lasix needed to neg balance, abg reviewed, no need BIPAP at this stage, follow for resp drive or somnolence, will need noctrunal BIPAP, will need follow up with Dr Halford Chessman and possible restudy of titration adequacy, will need repeat pcxr in  am , low voltage on ecg, assess echo for pericardial effusion, O 2 to sats 90% goal  Lavon Paganini. Titus Mould, MD, [place] Pgr: [facility] 06/09/2014 3:45 PM

## 2014-06-09 NOTE — H&P (Signed)
History and Physical  Douglas D Lightsey Jr. ZDG:[id number] DOB: 1985/09/11 DOA: 06/09/2014   PCP: Philis Fendt, MD  Referring Physician: ED/ Dr. Winfred Leeds  Chief Complaint: Dyspnea and somnolence  HPI:  29 year old male with history of cor pulmonale and chronic CO2 retention, OHS, and hypertension presented to his cardiologist office for routine visit today. Apparently, the patient was noted to be hypoxemic in the office with action saturation of 83%. As a result, the patient was sent to the emergency department for evaluation. The patient is not a good historian. This history is supplemented by the patient's sister who is at the bedside. Apparently, for the past 1-2 weeks she has noted that the patient has had some worsening confusion and somnolence. The patient is still able to perform all his activities of daily living. However, she notes that he has been sleeping more and more during the daytime, but he is easily arousable and able to carry on a conversation. The patient was discharged from the hospital on 03/25/2014 after suffering acute respiratory failure from cor pulmonale exacerbation. The patient does not weigh himself daily, nor does he follow a strict diet. Presently, the patient complains of some mild shortness of breath, but denies any chest pain, coughing, hemoptysis, fevers, chills, nausea, vomiting, diarrhea, abdominal pain, dysuria, hematuria, hematochezia. He does note some increasing abdominal girth. He denies any PND or orthopnea. The patient denies any illegal drug use. He denies any tobacco use. In the emergency department, the patient was noted to have oxygen saturation in the low 80s. ABG on 5 L showed 7.34/85/73/45. The patient was placed on 5 L nasal cannula with improvement of his oxygen saturation to 95-97 percent.  BMP was essentially unremarkable as well as his CBC. Chest x-ray showed some mild increased pulmonary vascular congestion. D-dimer was negative. EKG  showed sinus rhythm with diffusely flattened T waves.  Assessment/Plan: Acute respiratory with failure with hypoxemia and hypercarbia. -Multifactorial including hypercarbia causing hypoventilation to the point of causing hypoxemia as well as poor compliance with CPAP, cor pulmonale, and OHS.  -Clinically, the patient does not appear to be floridly fluid overloaded (no peripheral edema, no JVD), but his weights are of questionable accuracy--there has been wide variations.  -03/25/2014 weight 267 pounds  -06/09/2014 weight in cardiology office 287 pounds  -I have consulted pulmonary for an opinion  -give pt lasix 40mg  IV bid x 2 doses and reassess in am 5/17--patient is normally on Lasix 40 mg po bid -Bronchodilators when necessary shortness of breath Acute encephalopathy -Likely due to his hypercarbia and hypoxemia -Chek UA and urine culture -Urine drug screen -Ammonia -TSH -He is awake and alert and conversant during my examination  -finish cycling troponins -No focal neurologic deficits Hypertension  -Hold amlodipine as BP is soft  Cor pulmonale  -Again, question accuracy of his weights  -Not floridly fluid overloaded on exam  -03/20/2014 echo cardiac event with EF 60-55 percent, moderate decreased RV function  -Daily weights  -Furosemide as discussed above Polycythemia  -Secondary to his cor pulmonale and hypoventilation/hypoxemia OSA/OHS -place back on CPAP at night     Past Medical History  Diagnosis Date  . Hypertension   . Obesity   . Tobacco abuse   . Substance abuse 8/31/213    cociane "first time use"  . High cholesterol   . CHF (congestive heart failure)   . Chronic bronchitis   . OSA on CPAP   . Arthritis     "hands, feet" (  10/31/2013)  . Pneumonia 09/2011   Past Surgical History  Procedure Laterality Date  . No past surgeries     Social History:  reports that he quit smoking about 7 months ago. His smoking use included Cigarettes. He has a 4.5  pack-year smoking history. He has never used smokeless tobacco. He reports that he uses illicit drugs (Cocaine). He reports that he does not drink alcohol.   Family History  Problem Relation Age of Onset  . Heart disease Mother     Pacemaker  . Heart attack Neg Hx   . Stroke Neg Hx   . Hypertension Father   . Cancer Paternal Grandfather      No Known Allergies    Prior to Admission medications   Medication Sig Start Date End Date Taking? Authorizing Provider  albuterol (PROVENTIL HFA;VENTOLIN HFA) 108 (90 BASE) MCG/ACT inhaler Inhale 1-2 puffs into the lungs every 6 (six) hours as needed for wheezing or shortness of breath. 11/05/13  Yes Shanker Kristeen Mans, MD  amLODipine (NORVASC) 10 MG tablet Take 1 tablet (10 mg total) by mouth daily. 11/05/13  Yes Shanker Kristeen Mans, MD  aspirin EC 325 MG EC tablet Take 1 tablet (325 mg total) by mouth daily. 11/05/13  Yes Shanker Kristeen Mans, MD  furosemide (LASIX) 40 MG tablet Take 1 tablet (40 mg total) by mouth 2 (two) times daily. 01/27/14  Yes Evelina Bucy, MD    Review of Systems:  Constitutional:  No weight loss, night sweats, Fevers, chills, fatigue.  Head&Eyes: No headache.  No vision loss.  No eye pain or scotoma ENT:  No Difficulty swallowing,Tooth/dental problems,Sore throat,  No ear ache, post nasal drip,  Cardio-vascular:  No chest pain, Orthopnea, PND, swelling in lower extremities,  dizziness, palpitations  GI:  No  abdominal pain, nausea, vomiting, diarrhea, loss of appetite, hematochezia, melena, heartburn, indigestion, Resp:  No shortness of breath with exertion or at rest. No cough. No coughing up of blood .No wheezing.No chest wall deformity  Skin:  no rash or lesions.  GU:  no dysuria, change in color of urine, no urgency or frequency. No flank pain.  Musculoskeletal:  No joint pain or swelling. No decreased range of motion. No back pain.  Psych:  No change in mood or affect. No depression or  anxiety. Neurologic: No headache, no dysesthesia, no focal weakness, no vision loss. No syncope  Physical Exam: Filed Vitals:   06/09/14 1127 06/09/14 1230 06/09/14 1233 06/09/14 1300  BP:    113/58  Pulse:  91 91 89  Temp:      TempSrc:      Resp:    18  Height:      Weight:      SpO2: 92% 92% 86% 96%   General:  A&O x 3, NAD, nontoxic, pleasant/cooperative Head/Eye: No conjunctival hemorrhage, no icterus, [place]/AT, No nystagmus ENT:  No icterus,  No thrush, good dentition, no pharyngeal exudate Neck:  No masses, no lymphadenpathy, no bruits CV:  RRR, no rub, no gallop, no S3 Lung:  Bibasilar crackles. No wheezing Abdomen: soft/NT, +BS, nondistended, no peritoneal signs Ext: No cyanosis, No rashes, No petechiae, No lymphangitis, trace LE edema Neuro: CNII-XII intact, strength 4/5 in bilateral upper and lower extremities, no dysmetria  Labs on Admission:  Basic Metabolic Panel:  Recent Labs Lab 06/09/14 1143  NA 136  K 4.5  CL 85*  CO2 42*  GLUCOSE 93  BUN 14  CREATININE 1.16  CALCIUM 8.7*   Liver  Function Tests: No results for input(s): AST, ALT, ALKPHOS, BILITOT, PROT, ALBUMIN in the last 168 hours. No results for input(s): LIPASE, AMYLASE in the last 168 hours. No results for input(s): AMMONIA in the last 168 hours. CBC:  Recent Labs Lab 06/09/14 1143  WBC 8.6  HGB 18.0*  HCT 65.0*  MCV 91.8  PLT 192   Cardiac Enzymes:  Recent Labs Lab 06/09/14 1143  TROPONINI <0.03   BNP: Invalid input(s): POCBNP CBG: No results for input(s): GLUCAP in the last 168 hours.  Radiological Exams on Admission: Dg Chest 2 View (if Patient Has Fever And/or Copd)  06/09/2014   CLINICAL DATA:  Respiratory distress.  Shortness of breath.  EXAM: CHEST  2 VIEW  COMPARISON:  03/19/2014  FINDINGS: Bilateral diffuse interstitial thickening. No pleural effusion or pneumothorax. No focal consolidation. Stable cardiomegaly. Enlargement of the central pulmonary vasculature. No  acute osseous abnormality.  IMPRESSION: Findings most consistent with mild CHF.   Electronically Signed   By: Kathreen Devoid   On: 06/09/2014 12:00    EKG: Independently reviewed. Sinus rhythm, flat T waves diffusely.    Time spent:60 minutes Code Status:   FULL Family Communication:   Sister updated at bedside   Douglas Reitano, DO  [facility] Pager [phone]  If 7PM-7AM, please contact night-coverage [email or web address] Password [email or web address] 06/09/2014, 1:31 PM

## 2014-06-09 NOTE — Progress Notes (Signed)
Placed pt on cpap for the night, pt tolerating well at this time, informed pt to call for RT if needs help during the night

## 2014-06-09 NOTE — ED Notes (Addendum)
Went to MD this morning for regular appointment; at MD office oxygen saturation was 84% on his usual home O2 of 4L. Patient denies SOB or changes with breathing; just feels weak and tired. Currently arrives to ED on 4L home tank at 88%; increased to 94% on 5L. Denies pain. Denies any changes in health recently.

## 2014-06-10 ENCOUNTER — Encounter (HOSPITAL_COMMUNITY): Payer: Self-pay | Admitting: General Practice

## 2014-06-10 LAB — BASIC METABOLIC PANEL
ANION GAP: 14 (ref 5–15)
BUN: 15 mg/dL (ref 6–20)
CHLORIDE: 84 mmol/L — AB (ref 101–111)
CO2: 36 mmol/L — ABNORMAL HIGH (ref 22–32)
Calcium: 8.8 mg/dL — ABNORMAL LOW (ref 8.9–10.3)
Creatinine, Ser: 1.06 mg/dL (ref 0.61–1.24)
GFR calc non Af Amer: >60 mL/min (ref >60)
Glucose, Bld: 81 mg/dL (ref 65–99)
POTASSIUM: 5 mmol/L (ref 3.5–5.1)
Sodium: 134 mmol/L — ABNORMAL LOW (ref 135–145)

## 2014-06-10 LAB — URINE CULTURE
Colony Count: NO GROWTH
Culture: NO GROWTH

## 2014-06-10 LAB — HIV ANTIBODY (ROUTINE TESTING W REFLEX): HIV Screen 4th Generation wRfx: NONREACTIVE

## 2014-06-10 LAB — TROPONIN I: Troponin I: 0.05 ng/mL — ABNORMAL HIGH (ref <0.031)

## 2014-06-10 LAB — CBC
HCT: 62.1 % — ABNORMAL HIGH (ref 39.0–52.0)
Hemoglobin: 16.8 g/dL (ref 13.0–17.0)
MCH: 25.1 pg — AB (ref 26.0–34.0)
MCHC: 27.1 g/dL — AB (ref 30.0–36.0)
MCV: 92.8 fL (ref 78.0–100.0)
PLATELETS: 212 10*3/uL (ref 150–400)
RBC: 6.69 MIL/uL — ABNORMAL HIGH (ref 4.22–5.81)
RDW: 19.9 % — ABNORMAL HIGH (ref 11.5–15.5)
WBC: 8.2 10*3/uL (ref 4.0–10.5)

## 2014-06-10 MED ORDER — FUROSEMIDE 10 MG/ML IJ SOLN
40.0000 mg | Freq: Two times a day (BID) | INTRAMUSCULAR | Status: DC
  Administered 2014-06-10 – 2014-06-11 (×2): 40 mg via INTRAVENOUS
  Filled 2014-06-10 (×3): qty 4

## 2014-06-10 NOTE — Progress Notes (Signed)
[facility] PROGRESS NOTE  Douglas [name]. WUJ:[id number] DOB: 10/27/1985 DOA: 06/09/2014 PCP: Philis Fendt, MD  Brief narrative: 29 year old male with PMH CHF and OSA/OHS (VS patient) presented [facility] ED 5/16 with somnolence and dyspnea. Found to by hypoxemic in ED  Assessment/Plan: Active Problems:   OSA on CPAP/ Pulmonary hypertension/ Cor pulmonale/ Acute respiratory failure with hypoxia and hypercarbia - Pulmonology on board and assisting with management. - Continue current Lasix regimen. Patient improving on current regimen  Code Status: Full Family Communication: Discussed directly with patient Disposition Plan: Pending improvement in respiratory condition   Consultants:  Pulmonology  Procedures:  None  Antibiotics:  None  HPI/Subjective: Pt has no new complaints. No acute issues reported overnight. Reports his breathing is improved and he was able to sleep well  Objective: Filed Vitals:   06/10/14 1417  BP: 119/74  Pulse: 94  Temp: 98.4 F (36.9 C)  Resp: 20    Intake/Output Summary (Last 24 hours) at 06/10/14 1458 Last data filed at 06/10/14 1245  Gross per 24 hour  Intake   1120 ml  Output   1875 ml  Net   -755 ml   Filed Weights   06/09/14 1119 06/09/14 1522 06/10/14 0540  Weight: 287 lb 2 oz (130.239 kg) 285 lb 11.2 oz (129.593 kg) 284 lb 14.4 oz (129.23 kg)    Exam:   General:  Pt in NAD, alert and awake  Cardiovascular: rrr, no mrg  Respiratory: decreased breath sounds at bases, equal chest rise, breathing comfortably with nasal cannula on supplemental oxygen  Abdomen: Soft, obese, no guarding  Musculoskeletal: Normal muscle tone bilaterally   Data Reviewed: Basic Metabolic Panel:  Recent Labs Lab 06/09/14 1143 06/10/14 0259  NA 136 134*  K 4.5 5.0  CL 85* 84*  CO2 42* 36*  GLUCOSE 93 81  BUN 14 15  CREATININE 1.16 1.06  CALCIUM 8.7* 8.8*   Liver Function Tests:  Recent Labs Lab 06/09/14 1413  AST 21   ALT 16*  ALKPHOS 55  BILITOT 1.1  PROT 5.9*  ALBUMIN 2.5*   No results for input(s): LIPASE, AMYLASE in the last 168 hours.  Recent Labs Lab 06/09/14 1413  AMMONIA 78*   CBC:  Recent Labs Lab 06/09/14 1143 06/10/14 0259  WBC 8.6 8.2  HGB 18.0* 16.8  HCT 65.0* 62.1*  MCV 91.8 92.8  PLT 192 212   Cardiac Enzymes:  Recent Labs Lab 06/09/14 1143 06/09/14 1710 06/09/14 2323  TROPONINI <0.03 0.03 0.05*   BNP (last 3 results)  Recent Labs  03/19/14 1645 03/21/14 0400 06/09/14 1133  BNP 329.0* 163.7* 347.5*    ProBNP (last 3 results)  Recent Labs  10/31/13 1200  PROBNP 2527.0*    CBG: No results for input(s): GLUCAP in the last 168 hours.  Recent Results (from the past 240 hour(s))  Urine culture     Status: None   Collection Time: 06/09/14  3:27 PM  Result Value Ref Range Status   Specimen Description URINE, RANDOM  Final   Special Requests NONE  Final   Colony Count NO GROWTH Performed at [facility]   Final   Culture NO GROWTH Performed at [facility]   Final   Report Status 06/10/2014 FINAL  Final     Studies: Dg Chest 2 View (if Patient Has Fever And/or Copd)  06/09/2014   CLINICAL DATA:  Respiratory distress.  Shortness of breath.  EXAM: CHEST  2 VIEW  COMPARISON:  03/19/2014  FINDINGS: Bilateral diffuse interstitial thickening. No pleural effusion or pneumothorax. No focal consolidation. Stable cardiomegaly. Enlargement of the central pulmonary vasculature. No acute osseous abnormality.  IMPRESSION: Findings most consistent with mild CHF.   Electronically Signed   By: Kathreen Devoid   On: 06/09/2014 12:00    Scheduled Meds: . aspirin EC  325 mg Oral Daily  . enoxaparin (LOVENOX) injection  40 mg Subcutaneous Q24H  . furosemide  40 mg Intravenous BID  . sodium chloride  3 mL Intravenous Q12H   Continuous Infusions:    Time spent: > 30 minutes    Velvet Bathe  [facility] Pager [phone] If 7PM-7AM, please  contact night-coverage at [email or web address], password [facility] 06/10/2014, 2:58 PM  LOS: 1 day

## 2014-06-10 NOTE — Care Management Note (Signed)
Case Management Note  Patient Details  Name: Douglas [name]. MRN: [id number] Date of Birth: September 24, 1985  Subjective/Objective:                 Pt admitted on 06/09/14 with acute hypoxic respiratory failure related to CHF exacerbation.  PTA, pt resides at home with sister.  He is on chronic home oxygen at 4L/[place].    Action/Plan: Will follow for discharge planning as pt progresses.    Expected Discharge Date:                  Expected Discharge Plan:  [place]  In-House Referral:     Discharge planning Services  CM Consult  Post Acute Care Choice:    Choice offered to:     DME Arranged:    DME Agency:     HH Arranged:    [place] Agency:     Status of Service:  In process, will continue to follow  Medicare Important Message Given:    Date Medicare IM Given:    Medicare IM give by:    Date Additional Medicare IM Given:    Additional Medicare Important Message give by:     If discussed at [place] of Stay Meetings, dates discussed:    Additional Comments:  Ella Bodo, RN 06/10/2014, 4:21 PM Phone [phone]

## 2014-06-10 NOTE — Progress Notes (Signed)
Name: Douglas [name]. MRN: [id number] DOB: 1985/01/31    ADMISSION DATE:  06/09/2014 CONSULTATION DATE:  5/16  REFERRING MD :  Dr. Carles Collet TRH  CHIEF COMPLAINT:  Dyspnea  BRIEF PATIENT DESCRIPTION: 29 year old male with PMH CHF and OSA/OHS (VS patient) presented [facility] ED 5/16 with somnolence and dyspnea. Found to by hypoxemic in ED. Weights have been increased recently with no acute findings on CXR. Started on nasal canula O2, lasix and admitted to [facility]. PCCM consulted for further eval.   SIGNIFICANT EVENTS  2/24-3/1 admitted for acute on chronic hyercarbic/hypoxic RF with acute cor pulmonale due to poorly controlled OHS/OSA, dCHF.  STUDIES:  PSG 08/14/09 >> AHI 105.4, SaO2 low 56%, BiPAP 21/17 cm H2O Echo 11/01/13 >> severe LVH, EF 55 to 60%, mod RA/RV dilation V/Q scan 11/01/13 >> upper lobe airtrapping b/l Split 02/05/14 >> AHI 78.2, SaO2 low 52%. CPAP 13 cm H2O >> AHI 5.4, still had low SaO2 >> centrals with higher CPAP/EPAP. ONO with 5 liters 02/27/14 >> test time 58 min. Basal SpO2 66%, low SpO2 50%. Spent 55 min with SpO2 < 88%.  SUBJECTIVE:   VITAL SIGNS: Temp:  [97.9 F (36.6 C)-98.8 F (37.1 C)] 97.9 F (36.6 C) (05/17 0540) Pulse Rate:  [87-99] 96 (05/17 0540) Resp:  [16-21] 21 (05/17 0540) BP: (106-136)/(58-96) 120/73 mmHg (05/17 0540) SpO2:  [86 %-99 %] 95 % (05/17 0540) FiO2 (%):  [40 %] 40 % (05/16 1127) Weight:  [129.23 kg (284 lb 14.4 oz)-130.239 kg (287 lb 2 oz)] 129.23 kg (284 lb 14.4 oz) (05/17 0540)  PHYSICAL EXAMINATION: General:  Obese male in NAD Neuro:  Alert, oriented, non-focal Head: [place]/AT EENT:  No JVD noted, PERRL Cardiovascular:  RRR, no MRG Lungs:  Clear bilateral breath sounds, wearing BiPAP for nap Abdomen:  Soft, non-tender, obese Musculoskeletal:  No acute deformity or ROM limitation. Trace edema Skin:  Grossly intact  Recent Labs Lab 06/09/14 1143 06/10/14 0259  NA 136 134*  K 4.5 5.0  CL 85* 84*  CO2 42* 36*  BUN 14 15    CREATININE 1.16 1.06  GLUCOSE 93 81   Recent Labs Lab 06/09/14 1143 06/10/14 0259  HGB 18.0* 16.8  HCT 65.0* 62.1*  WBC 8.6 8.2  PLT 192 212   Dg Chest 2 View (if Patient Has Fever And/or Copd)  06/09/2014   CLINICAL DATA:  Respiratory distress.  Shortness of breath.  EXAM: CHEST  2 VIEW  COMPARISON:  03/19/2014  FINDINGS: Bilateral diffuse interstitial thickening. No pleural effusion or pneumothorax. No focal consolidation. Stable cardiomegaly. Enlargement of the central pulmonary vasculature. No acute osseous abnormality.  IMPRESSION: Findings most consistent with mild CHF.   Electronically Signed   By: Kathreen Devoid   On: 06/09/2014 12:00   ASSESSMENT / PLAN:  Acute on chronic respiratory failure. Multifactorial in setting of below OSA/OHS acute decompensation Acute on chronic diastolic CHF - Supplemental O2 to keep SpO2 88-94% (on 4 L/min at home at rest) - BiPAP QHS as at home - CXR in AM - Diurese again today, IV lasix 40mg  q 12 hours. (40 PO BID at home) - Daily weights, strict I&O - Incentive spirometry - Consider echocardiogram to r/o pericarditis with low voltage ECG - Will need pulmonary eval as outpatient for re-titration of BiPAP (Dr. Halford Chessman)  Elevated troponin - per cards.  Georgann Housekeeper, AGACNP-BC [place] [facility] Pager [phone] or [phone]  06/10/2014 2:62 AM  29 year old male with CHF, pulmonary  HTN, OSA and OHV presenting to PCCM with hypoxemia and respiratory failure.  I reviewed the CXR myself Clear evidence of pulmonary edema.  Discussed with patient and NP.  Hypoxemia: secondary to CHF and pulmonary HTN.  - Diureses as able.  - Supplemental O2.  - Titrate O2 for sat of 88-92%.  - Treat CHF.  Pulmonary edema: due to CHF.  - Aggressive diureses as ordered.  - Treat PHTN.  Pulmonary HTN: secondary to obesity hypoventilation.  - Nutrition consult.  - Advised regarding wt loss.  - No indication for pulmonary vascular  dilatation for now given that it is secondary pulmonary HTN.  Hypercarbia: due to obesity.  - Wt loss.  OSA and hypercarbia:  - Nocturnal BiPAP and O2.  Patient seen and examined, agree with above note.  I dictated the care and orders written for this patient under my direction.  Rush Farmer, MD [phone]

## 2014-06-11 ENCOUNTER — Inpatient Hospital Stay (HOSPITAL_COMMUNITY): Payer: Medicaid Other

## 2014-06-11 DIAGNOSIS — I270 Primary pulmonary hypertension: Secondary | ICD-10-CM

## 2014-06-11 LAB — CBC
HCT: 60.5 % — ABNORMAL HIGH (ref 39.0–52.0)
HEMOGLOBIN: 16.4 g/dL (ref 13.0–17.0)
MCH: 25.3 pg — ABNORMAL LOW (ref 26.0–34.0)
MCHC: 27.1 g/dL — ABNORMAL LOW (ref 30.0–36.0)
MCV: 93.2 fL (ref 78.0–100.0)
Platelets: 181 10*3/uL (ref 150–400)
RBC: 6.49 MIL/uL — ABNORMAL HIGH (ref 4.22–5.81)
RDW: 19.2 % — ABNORMAL HIGH (ref 11.5–15.5)
WBC: 6.8 10*3/uL (ref 4.0–10.5)

## 2014-06-11 LAB — BASIC METABOLIC PANEL
ANION GAP: 10 (ref 5–15)
BUN: 15 mg/dL (ref 6–20)
CO2: 43 mmol/L — AB (ref 22–32)
CREATININE: 1.08 mg/dL (ref 0.61–1.24)
Calcium: 8.5 mg/dL — ABNORMAL LOW (ref 8.9–10.3)
Chloride: 83 mmol/L — ABNORMAL LOW (ref 101–111)
GFR calc Af Amer: >60 mL/min (ref >60)
GFR calc non Af Amer: >60 mL/min (ref >60)
Glucose, Bld: 86 mg/dL (ref 65–99)
Potassium: 5 mmol/L (ref 3.5–5.1)
SODIUM: 136 mmol/L (ref 135–145)

## 2014-06-11 LAB — GLUCOSE, CAPILLARY: GLUCOSE-CAPILLARY: 93 mg/dL (ref 65–99)

## 2014-06-11 LAB — TROPONIN I: TROPONIN I: 0.03 ng/mL (ref <0.031)

## 2014-06-11 MED ORDER — FUROSEMIDE 10 MG/ML IJ SOLN
20.0000 mg | Freq: Two times a day (BID) | INTRAMUSCULAR | Status: DC
  Administered 2014-06-11 – 2014-06-12 (×2): 20 mg via INTRAVENOUS
  Filled 2014-06-11 (×2): qty 2

## 2014-06-11 NOTE — Progress Notes (Signed)
Name: Douglas [name]. MRN: [id number] DOB: [birth year]    ADMISSION DATE:  06/09/2014 CONSULTATION DATE:  5/16  REFERRING MD :  Dr. Carles Collet TRH  CHIEF COMPLAINT:  Dyspnea  BRIEF PATIENT DESCRIPTION: 29 year old male with PMH CHF and OSA/OHS (VS patient) presented [facility] ED 5/16 with somnolence and dyspnea. Found to by hypoxemic in ED. Weights have been increased recently with no acute findings on CXR. Started on nasal canula O2, lasix and admitted to [facility]. PCCM consulted for further eval.   SIGNIFICANT EVENTS  2/24-3/1 admitted for acute on chronic hyercarbic/hypoxic RF with acute cor pulmonale due to poorly controlled OHS/OSA, dCHF.  STUDIES:  PSG 08/14/09 >> AHI 105.4, SaO2 low 56%, BiPAP 21/17 cm H2O Echo 11/01/13 >> severe LVH, EF 55 to 60%, mod RA/RV dilation V/Q scan 11/01/13 >> upper lobe airtrapping b/l Split 02/05/14 >> AHI 78.2, SaO2 low 52%. CPAP 13 cm H2O >> AHI 5.4, still had low SaO2 >> centrals with higher CPAP/EPAP. ONO with 5 liters 02/27/14 >> test time 58 min. Basal SpO2 66%, low SpO2 50%. Spent 55 min with SpO2 < 88%.     SUBJECTIVE:  Pt reports he feels better, close to going home.   VITAL SIGNS: Temp:  [97.7 F (36.5 C)-98.7 F (37.1 C)] 98.7 F (37.1 C) (05/18 0840) Pulse Rate:  [81-103] 92 (05/18 0840) Resp:  [18-20] 18 (05/18 0840) BP: (112-119)/(70-77) 117/73 mmHg (05/18 0840) SpO2:  [93 %-96 %] 96 % (05/18 0840) Weight:  [283 lb 8 oz (128.595 kg)] 283 lb 8 oz (128.595 kg) (05/18 0707)  PHYSICAL EXAMINATION: General:  Obese male in NAD Neuro:  Alert, oriented, non-focal Head: [place]/AT EENT:  No JVD noted, PERRL Cardiovascular:  RRR, no MRG Lungs:  Resp's even/non-labored, lungs clear bilaterally Abdomen:  Soft, non-tender, obese Musculoskeletal:  No acute deformity or ROM limitation. Trace edema Skin:  Grossly intact  Recent Labs Lab 06/09/14 1143 06/10/14 0259 06/11/14 0757  NA 136 134* 136  K 4.5 5.0 5.0  CL 85* 84* 83*  CO2 42* 36* 43*   BUN 14 15 15   CREATININE 1.16 1.06 1.08  GLUCOSE 93 81 86    Recent Labs Lab 06/09/14 1143 06/10/14 0259 06/11/14 0757  HGB 18.0* 16.8 16.4  HCT 65.0* 62.1* 60.5*  WBC 8.6 8.2 PENDING  PLT 192 212 PENDING   Dg Chest 2 View (if Patient Has Fever And/or Copd)  06/09/2014   CLINICAL DATA:  Respiratory distress.  Shortness of breath.  EXAM: CHEST  2 VIEW  COMPARISON:  03/19/2014  FINDINGS: Bilateral diffuse interstitial thickening. No pleural effusion or pneumothorax. No focal consolidation. Stable cardiomegaly. Enlargement of the central pulmonary vasculature. No acute osseous abnormality.  IMPRESSION: Findings most consistent with mild CHF.   Electronically Signed   By: Kathreen Devoid   On: 06/09/2014 12:00   Dg Chest Port 1 View  06/11/2014   CLINICAL DATA:  Pulmonary edema.  EXAM: PORTABLE CHEST - 1 VIEW  COMPARISON:  06/09/2014.  02/06/2014.  01/27/2014.  FINDINGS: Mediastinum hilar structures are stable. Stable cardiomegaly. Stable mild interstitial prominence. These findings are consistent with mild congestive heart failure. Slight nodularity in the upper lobes noted most likely overlapping shadows. No pleural effusion or pneumothorax. Left costophrenic angle not imaged.  IMPRESSION: 1. Stable severe cardiomegaly. 2. Stable mild bilateral interstitial prominence suggesting mild CHF.   Electronically Signed   By: Marcello Moores  Register   On: 06/11/2014 07:51     ASSESSMENT / PLAN:  Acute  on chronic respiratory failure - multifactorial in setting of decompensated CHF, OSA/OHS OSA/OHS acute decompensation Acute on chronic diastolic CHF Secondary Pulmonary Hypertension   Plan: - Supplemental O2 to keep SpO2 88-94% (on 4 L/min at home at rest, 5L at night) - BiPAP QHS as at home - CXR in AM - Diurese again today, IV lasix 40mg  q 12 hours. (40 PO BID at home) - Daily weights, strict I&O - Incentive spirometry - Consider echocardiogram to r/o pericardial effusion with low voltage ECG -  Follow up with Dr. Halford Chessman as arranged, will need to consider re-titration of BiPAP (Dr. Halford Chessman) [facility] follow up arranged with Pulmonary    Elevated troponin   Plan: - per cards.   PCCM will be available PRN.  Please call if new needs arise.   Noe Gens, NP-C [place] [facility] Pgr: [phone] or [phone]  06/11/2014 51:70 AM  29 year old male with CHF, pulmonary HTN, OSA and OHV presenting to PCCM with hypoxemia and respiratory failure.  I reviewed the CXR myself Clear evidence of pulmonary edema.  Discussed with patient and NP.  Hypoxemia: secondary to CHF and pulmonary HTN. - Diureses with lasix 40 mg IV BID. - Supplemental O2. - Titrate O2 for sat of 88-92%. - Treat CHF.  Pulmonary edema: due to CHF. - Aggressive diureses as ordered.  Pulmonary HTN: secondary to obesity hypoventilation. - Nutrition consult. - Advised regarding wt loss. - No indication for pulmonary vascular dilatation for now given that it is secondary pulmonary HTN.  Hypercarbia: due to obesity. - Wt loss.  OSA and hypercarbia: - Nocturnal CPAP and O2.  Discussed with NP, need to continue diureses, maybe worthwhile to have cardiology evaluate patient and repeat an echo but will defer to primary.  PCCM will be available PRN, please call back if needed.  Patient seen and examined, agree with above note. I dictated the care and orders written for this patient under my direction.  Rush Farmer, MD [phone]

## 2014-06-11 NOTE — Progress Notes (Signed)
[facility]                                                                              Patient Demographics  Douglas [name], is a 29 y.o. male, DOB - [birth year], FXT:[id number]  Admit date - 06/09/2014   Admitting Physician Orson Eva, MD  Outpatient Primary MD for the patient is Philis Fendt, MD  LOS - 2   Chief Complaint  Patient presents with  . Respiratory Distress      HPI on 06/09/2014 by [name] 29 year old male with history of cor pulmonale and chronic CO2 retention, OHS, and hypertension presented to his cardiologist office for routine visit today. Apparently, the patient was noted to be hypoxemic in the office with action saturation of 83%. As a result, the patient was sent to the emergency department for evaluation. The patient is not a good historian. This history is supplemented by the patient's sister who is at the bedside. Apparently, for the past 1-2 weeks she has noted that the patient has had some worsening confusion and somnolence. The patient is still able to perform all his activities of daily living. However, she notes that he has been sleeping more and more during the daytime, but he is easily arousable and able to carry on a conversation. The patient was discharged from the hospital on 03/25/2014 after suffering acute respiratory failure from cor pulmonale exacerbation. The patient does not weigh himself daily, nor does he follow a strict diet. Presently, the patient complains of some mild shortness of breath, but denies any chest pain, coughing, hemoptysis, fevers, chills, nausea, vomiting, diarrhea, abdominal pain, dysuria, hematuria, hematochezia. He does note some increasing abdominal girth. He denies any PND or orthopnea. The patient denies any illegal drug use. He denies any tobacco use. In the emergency department, the patient was noted to have oxygen saturation in the low 80s. ABG on 5 L showed 7.34/85/73/45. The patient was placed on 5 L nasal  cannula with improvement of his oxygen saturation to 95-97 percent. BMP was essentially unremarkable as well as his CBC. Chest x-ray showed some mild increased pulmonary vascular congestion. D-dimer was negative. EKG showed sinus rhythm with diffusely flattened T waves.  Assessment & Plan   Acute on chronic respiratory failure -Factorial in failure decompensated CHF, obstructive sleep apnea, OHS -Pulmonology consulted and appreciated -Continue supplemental oxygen  Acute on chronic diastolic heart failure -Continue diuresis -Monitor intake and output, daily weights -Echocardiogram 03/20/2014 showed EF of 60-65%  OSA/OHS, acute decompensation -Continue supplemental oxygen, 4 L during the day, 5 L at night -Continue CPAP QHS  Elevated troponin -Likely secondary to demand ischemia -No complaints of chest pain -Troponins trending downward  Pulmonary hypertension -Secondary to obesity and OHS -Patient to discuss with his primary care physician (modifications including weight loss and diet -Patient will need to follow-up with pulmonology as an outpatient  Code Status: Full  Family Communication: None at bedside  Disposition Plan: Admitted, will continue diuresis. Likely discharge within the next 24-48 hours.  Time Spent in minutes   30 minutes  Procedures  None  Consults   Pulmonology  DVT Prophylaxis  Lovenox  Lab Results  Component Value Date  PLT 181 06/11/2014    Medications  Scheduled Meds: . aspirin EC  325 mg Oral Daily  . enoxaparin (LOVENOX) injection  40 mg Subcutaneous Q24H  . furosemide  20 mg Intravenous BID  . sodium chloride  3 mL Intravenous Q12H   Continuous Infusions:  PRN Meds:.acetaminophen **OR** acetaminophen, albuterol, ondansetron **OR** ondansetron (ZOFRAN) IV  Antibiotics    Anti-infectives    None      Subjective:   Douglas [name] seen and examined today.  Patient feels his breathing is back at baseline. He denies any chest  pain. He does state he has been up walking and has not felt any shortness of breath. Patient denies any chest pain, dizziness, headache, abdominal pain.  Objective:   Filed Vitals:   06/10/14 2120 06/11/14 0153 06/11/14 0707 06/11/14 0840  BP: 112/75 115/70 115/77 117/73  Pulse: 81 95 103 92  Temp: 98.1 F (36.7 C) 97.7 F (36.5 C) 97.8 F (36.6 C) 98.7 F (37.1 C)  TempSrc: Oral Axillary Oral Oral  Resp: 20 20 18 18   Height:      Weight:   128.595 kg (283 lb 8 oz)   SpO2: 96% 95% 93% 96%    Wt Readings from Last 3 Encounters:  06/11/14 128.595 kg (283 lb 8 oz)  06/09/14 130.237 kg (287 lb 1.9 oz)  04/07/14 122.471 kg (270 lb)     Intake/Output Summary (Last 24 hours) at 06/11/14 1257 Last data filed at 06/11/14 1100  Gross per 24 hour  Intake   1208 ml  Output   2100 ml  Net   -892 ml    Exam  General: Well developed, well nourished, NAD, appears stated age  HEENT: NCAT, mucous membranes moist.   Cardiovascular: S1 S2 auscultated, no rubs, murmurs or gallops. Regular rate and rhythm.  Respiratory: Clear to auscultation bilaterally with equal chest rise  Abdomen: Soft, obese, nontender, nondistended, + bowel sounds  Extremities: warm dry without cyanosis clubbing or edema  Neuro: AAOx3, nonfocal  Psych: Normal affect and demeanor with intact judgement and insight    Data Review   Micro Results Recent Results (from the past 240 hour(s))  Urine culture     Status: None   Collection Time: 06/09/14  3:27 PM  Result Value Ref Range Status   Specimen Description URINE, RANDOM  Final   Special Requests NONE  Final   Colony Count NO GROWTH Performed at [facility]   Final   Culture NO GROWTH Performed at [facility]   Final   Report Status 06/10/2014 FINAL  Final    Radiology Reports Dg Chest 2 View (if Patient Has Fever And/or Copd)  06/09/2014   CLINICAL DATA:  Respiratory distress.  Shortness of breath.  EXAM: CHEST  2 VIEW   COMPARISON:  03/19/2014  FINDINGS: Bilateral diffuse interstitial thickening. No pleural effusion or pneumothorax. No focal consolidation. Stable cardiomegaly. Enlargement of the central pulmonary vasculature. No acute osseous abnormality.  IMPRESSION: Findings most consistent with mild CHF.   Electronically Signed   By: Kathreen Devoid   On: 06/09/2014 12:00   Dg Chest Port 1 View  06/11/2014   CLINICAL DATA:  Pulmonary edema.  EXAM: PORTABLE CHEST - 1 VIEW  COMPARISON:  06/09/2014.  02/06/2014.  01/27/2014.  FINDINGS: Mediastinum hilar structures are stable. Stable cardiomegaly. Stable mild interstitial prominence. These findings are consistent with mild congestive heart failure. Slight nodularity in the upper lobes noted most likely overlapping shadows. No pleural effusion or  pneumothorax. Left costophrenic angle not imaged.  IMPRESSION: 1. Stable severe cardiomegaly. 2. Stable mild bilateral interstitial prominence suggesting mild CHF.   Electronically Signed   By: Marcello Moores  Register   On: 06/11/2014 07:51    CBC  Recent Labs Lab 06/09/14 1143 06/10/14 0259 06/11/14 0757  WBC 8.6 8.2 6.8  HGB 18.0* 16.8 16.4  HCT 65.0* 62.1* 60.5*  PLT 192 212 181  MCV 91.8 92.8 93.2  MCH 25.4* 25.1* 25.3*  MCHC 27.7* 27.1* 27.1*  RDW 19.9* 19.9* 19.2*    Chemistries   Recent Labs Lab 06/09/14 1143 06/09/14 1413 06/10/14 0259 06/11/14 0757  NA 136  --  134* 136  K 4.5  --  5.0 5.0  CL 85*  --  84* 83*  CO2 42*  --  36* 43*  GLUCOSE 93  --  81 86  BUN 14  --  15 15  CREATININE 1.16  --  1.06 1.08  CALCIUM 8.7*  --  8.8* 8.5*  AST  --  21  --   --   ALT  --  16*  --   --   ALKPHOS  --  55  --   --   BILITOT  --  1.1  --   --    ------------------------------------------------------------------------------------------------------------------ estimated creatinine clearance is 123.1 mL/min (by C-G formula based on Cr of  1.08). ------------------------------------------------------------------------------------------------------------------ No results for input(s): HGBA1C in the last 72 hours. ------------------------------------------------------------------------------------------------------------------ No results for input(s): CHOL, HDL, LDLCALC, TRIG, CHOLHDL, LDLDIRECT in the last 72 hours. ------------------------------------------------------------------------------------------------------------------  Recent Labs  06/09/14 1413  TSH 1.440   ------------------------------------------------------------------------------------------------------------------ No results for input(s): VITAMINB12, FOLATE, FERRITIN, TIBC, IRON, RETICCTPCT in the last 72 hours.  Coagulation profile No results for input(s): INR, PROTIME in the last 168 hours.   Recent Labs  06/09/14 1133  DDIMER <0.27    Cardiac Enzymes  Recent Labs Lab 06/09/14 1710 06/09/14 2323 06/11/14 0757  TROPONINI 0.03 0.05* 0.03   ------------------------------------------------------------------------------------------------------------------ Invalid input(s): POCBNP    Destani Wamser D.O. on 06/11/2014 at 12:57 PM  Between 7am to 7pm - Pager - [phone]  After 7pm go to [email or web address] - password [email or web address]  And look for the night coverage person covering for me after hours  [facility] Office  [phone]

## 2014-06-11 NOTE — Progress Notes (Signed)
Alert oriented, denies pain or shortness of breath, patient on 4L oxygen, SR on the monitor. Will continue to monitor patient.

## 2014-06-12 MED ORDER — AMLODIPINE BESYLATE 5 MG PO TABS: 5.0000 mg | ORAL_TABLET | Freq: Every day | ORAL | Status: DC

## 2014-06-12 NOTE — Discharge Summary (Signed)
Physician Discharge Summary  Douglas [name]. [name] DOB: 09-29-1985 DOA: 06/09/2014  PCP: Philis Fendt, MD  Admit date: 06/09/2014 Discharge date: 06/12/2014  Time spent: 45 minutes  Recommendations for Outpatient Follow-up:  Patient will be discharged to home.  Patient will need to follow up with primary care provider within one week of discharge.  He will also need to follow up with Dr. Halford Chessman, pulmonology.  Patient should continue medications as prescribed.  Patient should follow a heart healthy diet.  Discharge Diagnoses:  Acute on chronic respiratory failure Acute on chronic diastolic heart failure OSA/OHS, acute decompensation Elevated troponin Pulmonary hypertension  Discharge Condition: Stable  Diet recommendation: Heart healthy  Filed Weights   06/10/14 0540 06/11/14 0707 06/12/14 0451  Weight: 129.23 kg (284 lb 14.4 oz) 128.595 kg (283 lb 8 oz) 128.8 kg (283 lb 15.2 oz)    History of present illness:  on 06/09/2014 by [name] 29 year old male with history of cor pulmonale and chronic CO2 retention, OHS, and hypertension presented to his cardiologist office for routine visit today. Apparently, the patient was noted to be hypoxemic in the office with action saturation of 83%. As a result, the patient was sent to the emergency department for evaluation. The patient is not a good historian. This history is supplemented by the patient's sister who is at the bedside. Apparently, for the past 1-2 weeks she has noted that the patient has had some worsening confusion and somnolence. The patient is still able to perform all his activities of daily living. However, she notes that he has been sleeping more and more during the daytime, but he is easily arousable and able to carry on a conversation. The patient was discharged from the hospital on 03/25/2014 after suffering acute respiratory failure from cor pulmonale exacerbation. The patient does not weigh himself daily,  nor does he follow a strict diet. Presently, the patient complains of some mild shortness of breath, but denies any chest pain, coughing, hemoptysis, fevers, chills, nausea, vomiting, diarrhea, abdominal pain, dysuria, hematuria, hematochezia. He does note some increasing abdominal girth. He denies any PND or orthopnea. The patient denies any illegal drug use. He denies any tobacco use. In the emergency department, the patient was noted to have oxygen saturation in the low 80s. ABG on 5 L showed 7.34/85/73/45. The patient was placed on 5 L nasal cannula with improvement of his oxygen saturation to 95-97 percent. BMP was essentially unremarkable as well as his CBC. Chest x-ray showed some mild increased pulmonary vascular congestion. D-dimer was negative. EKG showed sinus rhythm with diffusely flattened T waves.  Hospital Course:  Acute on chronic respiratory failure -Factorial in failure decompensated CHF, obstructive sleep apnea, OHS -Appears to be back at baseline -Pulmonology consulted and appreciated -Continue supplemental oxygen  Acute on chronic diastolic heart failure -Continue diuresis -Monitor intake and output, daily weights -Urine output 1970mL over past 24 hours -Echocardiogram 03/20/2014 showed EF of 60-65%  OSA/OHS, acute decompensation -Continue supplemental oxygen, 4 L during the day, 5 L at night -Continue CPAP QHS   Elevated troponin -Likely secondary to demand ischemia -No complaints of chest pain -Troponins trending downward  Pulmonary hypertension -Secondary to obesity and OHS -Patient to discuss with his primary care physician (modifications including weight loss and diet) -Patient will need to follow-up with pulmonology as an outpatient  Procedures  None  Consults  Pulmonology  Discharge Exam: Filed Vitals:   06/12/14 1011  BP: 130/64  Pulse: 96  Temp: 98.9 F (  37.2 C)  Resp: 20   Exam  General: Well developed, well nourished, No  distress  HEENT: NCAT, mucous membranes moist.   Cardiovascular: S1 S2 auscultated, no rubs, murmurs or gallops. Regular rate and rhythm.  Respiratory: Clear to auscultation  Abdomen: Soft, obese, nontender, nondistended, + bowel sounds  Extremities: warm dry without cyanosis clubbing or edema  Neuro: AAOx3, nonfocal  Psych: Normal affect and demeanor  Discharge Instructions      Discharge Instructions    Discharge instructions    Complete by:  As directed   Patient will be discharged to home.  Patient will need to follow up with primary care provider within one week of discharge.  He will also need to follow up with Dr. Halford Chessman, pulmonology.  Patient should continue medications as prescribed.  Patient should follow a heart healthy diet.            Medication List    TAKE these medications        albuterol 108 (90 BASE) MCG/ACT inhaler  Commonly known as:  PROVENTIL HFA;VENTOLIN HFA  Inhale 1-2 puffs into the lungs every 6 (six) hours as needed for wheezing or shortness of breath.     amLODipine 5 MG tablet  Commonly known as:  NORVASC  Take 1 tablet (5 mg total) by mouth daily.     aspirin 325 MG EC tablet  Take 1 tablet (325 mg total) by mouth daily.     furosemide 40 MG tablet  Commonly known as:  LASIX  Take 1 tablet (40 mg total) by mouth 2 (two) times daily.       No Known Allergies Follow-up Information    Follow up with [name],[name], NP On 06/19/2014.   Specialty:  Nurse Practitioner   Why:  Appt at 10:45 for hospital follow up    Contact information:   [address] [place] [address] [phone]       Follow up with [name], MD On 07/29/2014.   Specialty:  Pulmonary Disease   Why:  Appt at 10:45    Contact information:   [address] [place] [address] [phone]       Follow up with [name], MD. Schedule an appointment as soon as possible for a visit in 1 week.   Specialty:  Internal Medicine   Why:  Hospital  followup   Contact information:   [place] [address] [phone]        The results of significant diagnostics from this hospitalization (including imaging, microbiology, ancillary and laboratory) are listed below for reference.    Significant Diagnostic Studies: Dg Chest 2 View (if Patient Has Fever And/or Copd)  06/09/2014   CLINICAL DATA:  Respiratory distress.  Shortness of breath.  EXAM: CHEST  2 VIEW  COMPARISON:  03/19/2014  FINDINGS: Bilateral diffuse interstitial thickening. No pleural effusion or pneumothorax. No focal consolidation. Stable cardiomegaly. Enlargement of the central pulmonary vasculature. No acute osseous abnormality.  IMPRESSION: Findings most consistent with mild CHF.   Electronically Signed   By: Kathreen Devoid   On: 06/09/2014 12:00   Dg Chest Port 1 View  06/11/2014   CLINICAL DATA:  Pulmonary edema.  EXAM: PORTABLE CHEST - 1 VIEW  COMPARISON:  06/09/2014.  02/06/2014.  01/27/2014.  FINDINGS: Mediastinum hilar structures are stable. Stable cardiomegaly. Stable mild interstitial prominence. These findings are consistent with mild congestive heart failure. Slight nodularity in the upper lobes noted most likely overlapping shadows. No pleural effusion  or pneumothorax. Left costophrenic angle not imaged.  IMPRESSION: 1. Stable severe cardiomegaly. 2. Stable mild bilateral interstitial prominence suggesting mild CHF.   Electronically Signed   By: Marcello Moores  Register   On: 06/11/2014 07:51    Microbiology: Recent Results (from the past 240 hour(s))  Urine culture     Status: None   Collection Time: 06/09/14  3:27 PM  Result Value Ref Range Status   Specimen Description URINE, RANDOM  Final   Special Requests NONE  Final   Colony Count NO GROWTH Performed at [facility]   Final   Culture NO GROWTH Performed at [facility]   Final   Report Status 06/10/2014 FINAL  Final     Labs: Basic Metabolic Panel:  Recent Labs Lab  06/09/14 1143 06/10/14 0259 06/11/14 0757  NA 136 134* 136  K 4.5 5.0 5.0  CL 85* 84* 83*  CO2 42* 36* 43*  GLUCOSE 93 81 86  BUN 14 15 15   CREATININE 1.16 1.06 1.08  CALCIUM 8.7* 8.8* 8.5*   Liver Function Tests:  Recent Labs Lab 06/09/14 1413  AST 21  ALT 16*  ALKPHOS 55  BILITOT 1.1  PROT 5.9*  ALBUMIN 2.5*   No results for input(s): LIPASE, AMYLASE in the last 168 hours.  Recent Labs Lab 06/09/14 1413  AMMONIA 78*   CBC:  Recent Labs Lab 06/09/14 1143 06/10/14 0259 06/11/14 0757  WBC 8.6 8.2 6.8  HGB 18.0* 16.8 16.4  HCT 65.0* 62.1* 60.5*  MCV 91.8 92.8 93.2  PLT 192 212 181   Cardiac Enzymes:  Recent Labs Lab 06/09/14 1143 06/09/14 1710 06/09/14 2323 06/11/14 0757  TROPONINI <0.03 0.03 0.05* 0.03   BNP: BNP (last 3 results)  Recent Labs  03/19/14 1645 03/21/14 0400 06/09/14 1133  BNP 329.0* 163.7* 347.5*    ProBNP (last 3 results)  Recent Labs  10/31/13 1200  PROBNP 2527.0*    CBG:  Recent Labs Lab 06/11/14 0656  GLUCAP 93       Signed:  Analisia Kingsford  [facility] 06/12/2014, 10:12 AM

## 2014-06-12 NOTE — Progress Notes (Addendum)
Pt was asleep in recliner at initial visit to put on CPAP. RN woke patient to move to bed and pt told RT he wasn't ready for CPAP. He asked RT to come back at 2330. RT returned and Pt stated he was not ready. RT left and was called by RN at 2345 stating Pt was not ready to be put on. RT explained it would be about 30 minutes before they could return. Upon returning at [place] pt seemed upset. PT was placed on CPAP 5cm H2O (home setting) with 4L O2 bled in. Upon reviewing MD notes it appears patient should be wearing BIPAP QHS 21/17. This is not the setting pt stated. RT will return to question pt and make change. Water added to humidity chamber. Pt resting when RT left. RT will monitor.

## 2014-06-12 NOTE — Progress Notes (Signed)
Reviewed all discharge instructions, including meds and prescription - pt stated he has all meds at home.  Aware prescription called to pharmacy when he needs refill for Norvasc.

## 2014-06-12 NOTE — Progress Notes (Signed)
Telemetry monitor d/c'd per MD order.  CCMD notified and pt informed

## 2014-06-12 NOTE — Discharge Instructions (Signed)
Acute Respiratory Failure °Respiratory failure is when your lungs are not working well and your breathing (respiratory) system fails. When respiratory failure occurs, it is difficult for your lungs to get enough oxygen, get rid of carbon dioxide, or both. Respiratory failure can be life threatening.  °Respiratory failure can be acute or chronic. Acute respiratory failure is sudden, severe, and requires emergency medical treatment. Chronic respiratory failure is less severe, happens over time, and requires ongoing treatment.  °WHAT ARE THE CAUSES OF ACUTE RESPIRATORY FAILURE?  °Any problem affecting the heart or lungs can cause acute respiratory failure. Some of these causes include the following: °· Chronic bronchitis and emphysema (COPD).   °· Blood clot going to a lung (pulmonary embolism).   °· Having water in the lungs caused by heart failure, lung injury, or infection (pulmonary edema).   °· Collapsed lung (pneumothorax).   °· Pneumonia.   °· Pulmonary fibrosis.   °· Obesity.   °· Asthma.   °· Heart failure.   °· Any type of trauma to the chest that can make breathing difficult.   °· Nerve or muscle diseases making chest movements difficult. °WHAT SYMPTOMS SHOULD YOU WATCH FOR?  °If you have any of these signs or symptoms, you should seek immediate medical care:  °· You have shortness of breath (dyspnea) with or without activity.   °· You have rapid, fast breathing (tachypnea).   °· You are wheezing. °· You are unable to say more than a few words without having to catch your breath. °· You find it very difficult to function normally. °· You have a fast heart rate.   °· You have a bluish color to your finger or toe nail beds.   °· You have confusion or drowsiness or both.   °HOW WILL MY ACUTE RESPIRATORY FAILURE BE TREATED?  °Treatment of acute respiratory failure depends on the cause of the respiratory failure. Usually, you will stay in the intensive care unit so your breathing can be watched closely. Treatment  can include the following: °· Oxygen. Oxygen can be delivered through the following: °¨ Nasal cannula. This is small tubing that goes in your nose to give you oxygen. °¨ Face mask. A face mask covers your nose and mouth to give you oxygen. °· Medicine. Different medicines can be given to help with breathing. These can include: °¨ Nebulizers. Nebulizers deliver medicines to open the air passages (bronchodilators). These medicines help to open or relax the airways in the lungs so you can breathe better. They can also help loosen mucus from your lungs. °¨ Diuretics. Diuretic medicines can help you breathe better by getting rid of extra water in your body. °¨ Steroids. Steroid medicines can help decrease swelling (inflammation) in your lungs. °¨ Antibiotics. °· Chest tube. If you have a collapsed lung (pneumothorax), a chest tube is placed to help reinflate the lung. °· Non-invasive positive pressure ventilation (NPPV). This is a tight-fitting mask that goes over your nose and mouth. The mask has tubing that is attached to a machine. The machine blows air into the tubing, which helps to keep the tiny air sacs (alveoli) in your lungs open. This machine allows you to breathe on your own. °· Ventilator. A ventilator is a breathing machine. When on a ventilator, a breathing tube is put into the lungs. A ventilator is used when you can no longer breathe well enough on your own. You may have low oxygen levels or high carbon dioxide (CO2) levels in your blood. When you are on a ventilator, sedation and pain medicines are given to make you sleep   so your lungs can heal. Document Released: 01/15/2013 Document Revised: 05/27/2013 Document Reviewed: 01/15/2013 [facility] Patient Information 2015 [place], [place]. This information is not intended to replace advice given to you by your health care provider. Make sure you discuss any questions you have with your health care provider.     Work Note: To whom it concern:   Mr.  Douglas [name]  was admitted to the Hospital from 06/09/2014 to 06/12/2014 and was discharged on 06/12/2014. Mr./ Ms. Douglas [name] should be excused from work starting5/16/2016 and may return to work without any restrictions on 06/16/2014.  Call [facility] office [phone] for any questions.  Sincerely, Cristal Ford, DO [facility] 06/12/2014, 10:13 AM

## 2014-06-19 ENCOUNTER — Inpatient Hospital Stay: Payer: Medicaid Other | Admitting: Adult Health

## 2014-06-30 ENCOUNTER — Encounter: Payer: Self-pay | Admitting: Adult Health

## 2014-06-30 ENCOUNTER — Ambulatory Visit (INDEPENDENT_AMBULATORY_CARE_PROVIDER_SITE_OTHER): Payer: Medicaid Other | Admitting: Adult Health

## 2014-06-30 VITALS — BP 126/70 | HR 99 | Temp 98.3°F | Ht 65.0 in | Wt 296.8 lb

## 2014-06-30 DIAGNOSIS — J9601 Acute respiratory failure with hypoxia: Secondary | ICD-10-CM | POA: Diagnosis not present

## 2014-06-30 DIAGNOSIS — G4733 Obstructive sleep apnea (adult) (pediatric): Secondary | ICD-10-CM

## 2014-06-30 DIAGNOSIS — I272 Pulmonary hypertension, unspecified: Secondary | ICD-10-CM

## 2014-06-30 DIAGNOSIS — Z9989 Dependence on other enabling machines and devices: Secondary | ICD-10-CM

## 2014-06-30 DIAGNOSIS — J9622 Acute and chronic respiratory failure with hypercapnia: Secondary | ICD-10-CM

## 2014-06-30 DIAGNOSIS — I270 Primary pulmonary hypertension: Secondary | ICD-10-CM | POA: Diagnosis not present

## 2014-06-30 DIAGNOSIS — J9602 Acute respiratory failure with hypercapnia: Secondary | ICD-10-CM

## 2014-06-30 NOTE — Assessment & Plan Note (Signed)
BIPAP download.  Work on [employer] loss Will call [employer] to check on Oxygen tanks   Wear BIPAP At bedtime -all night  Follow up Dr. Halford Chessman  In 6- 8 weeks and As needed   Please contact office for sooner follow up if symptoms do not improve or worsen or seek emergency care

## 2014-06-30 NOTE — Patient Instructions (Signed)
BIPAP download.  Work on [employer] loss Will call [employer] to check on Oxygen tanks   Wear BIPAP At bedtime -all night  Follow up Dr. Halford Chessman  In 6- 8 weeks and As needed   Please contact office for sooner follow up if symptoms do not improve or worsen or seek emergency care

## 2014-06-30 NOTE — Progress Notes (Signed)
Chief Complaint  Patient presents with  . Follow-up    Hosp f/u; feels better since d/c; no SOB/cough/chest tightness    History of Present Illness: Douglas [name] is a 30 y.o. male with chronic respiratory failure from OSA, OHS, cor pulmonale, and diastolic CHF.  TESTS: PSG 08/14/09 >> AHI 105.4, SaO2 low 56%, BiPAP 21/17 cm H2O Echo 11/01/13 >> severe LVH, EF 55 to 60%, mod RA/RV dilation V/Q scan 11/01/13 >> upper lobe airtrapping b/l Split 02/05/14 >> AHI 78.2, SaO2 low 52%. CPAP 13 cm H2O >> AHI 5.4, still had low SaO2 >> centrals with higher CPAP/EPAP. ONO with 5 liters 02/27/14 >> test time 58 min. Basal SpO2 66%, low SpO2 50%. Spent 55 min with SpO2 < 88%.  04/07/2014 [facility] follow up  Returns for follow up from recent hosp stay.  Admitted 2/24-3/1 for acute on chronic hyercarbic/hypoxic RF with acute cor pulmonale due to poorly controlled OHS/OSA. +deconmpensated Diastolic Dysfunction.  Was not compliant with BIPAP PTA , wt was up 45lbs.  tx w/ aggressive diuresis , and BIPAP  Says he is feeling better with less dyspnea  Wt is down 60lbs since admission.  Says he is wearing BIPAP .  No chest pain,orthopnea, edema , fever. Or hemoptysis .  >>BIPAP download   06/30/2014 [facility] follow up  Patient presents for a post hospital follow-up Patient was recently readmitted May 16 through May 19 for acute on chronic hypoxic and hypercarbic respiratory failure with decompensated diastolic heart failure. Patient was diuresed with improvement. Continued on oxygen 4 L during the daytime of 5 L at nighttime with his BiPAP machine. He says he is improved with decreased dyspnea.  Today did not bring his O2 with him, says he is out of portable tanks.  Sats very low at 65% on RA. Placed on 4l/m of O2 with sats up to 92% . We discussed O2 compliance and dangers of hypoxia.  We callled [employer] with delivery of O2 to office so he could have tank to go home with and to have  additional tanks delivered to him.  Says he wears BIPAP everynight Download requested.  Feels rested with no sign daytime sleepiness or confusion.  No chest pain, orthopnea, fever or hemoptysis       ROS:  Constitutional:   No  weight loss, night sweats,  Fevers, chills, + fatigue, or  lassitude.  HEENT:   No headaches,  Difficulty swallowing,  Tooth/dental problems, or  Sore throat,                No sneezing, itching, ear ache, nasal congestion, post nasal drip,   CV:  No chest pain,  Orthopnea, PND, swelling in lower extremities, anasarca, dizziness, palpitations, syncope.   GI  No heartburn, indigestion, abdominal pain, nausea, vomiting, diarrhea, change in bowel habits, loss of appetite, bloody stools.   Resp:  .  No chest wall deformity  Skin: no rash or lesions.  GU: no dysuria, change in color of urine, no urgency or frequency.  No flank pain, no hematuria   MS:  No joint pain or swelling.  No decreased range of motion.  No back pain.  Psych:  No change in mood or affect. No depression or anxiety.  No memory loss.     EXAM :  GEN: A/Ox3; pleasant , NAD, obese  HEENT:  [place]/AT,  EACs-clear, TMs-wnl, NOSE-clear, THROAT-clear, no lesions, no postnasal drip or exudate noted.   NECK:  Supple w/ fair  ROM; no JVD; normal carotid impulses w/o bruits; no thyromegaly or nodules palpated; no lymphadenopathy.  RESP  Decreased BS ,  w/o, wheezes/ rales/ or rhonchi.no accessory muscle use, no dullness to percussion  CARD:  RRR, no m/r/g  , tr-1+ peripheral edema, pulses intact, no cyanosis or clubbing.  GI:   Soft & nt; nml bowel sounds; no organomegaly or masses detected.  Musco: Warm bil, no deformities or joint swelling noted.   Neuro: alert, no focal deficits noted.    Skin: Warm, no lesions or rashes

## 2014-06-30 NOTE — Assessment & Plan Note (Signed)
O2 noncompliance-pt encouraged on O2 usage.  DME contacted for support.  Pt to wear 4l/m rest and 5l/m At bedtime

## 2014-07-01 NOTE — Progress Notes (Signed)
Reviewed and agree with assessment/plan. 

## 2014-07-20 ENCOUNTER — Encounter: Payer: Self-pay | Admitting: Pulmonary Disease

## 2014-07-29 ENCOUNTER — Encounter: Payer: Self-pay | Admitting: Pulmonary Disease

## 2014-07-29 ENCOUNTER — Ambulatory Visit (INDEPENDENT_AMBULATORY_CARE_PROVIDER_SITE_OTHER): Payer: Medicaid Other | Admitting: Pulmonary Disease

## 2014-07-29 VITALS — BP 122/82 | HR 96 | Ht 64.0 in | Wt 290.0 lb

## 2014-07-29 DIAGNOSIS — G4733 Obstructive sleep apnea (adult) (pediatric): Secondary | ICD-10-CM | POA: Diagnosis not present

## 2014-07-29 DIAGNOSIS — E662 Morbid (severe) obesity with alveolar hypoventilation: Secondary | ICD-10-CM | POA: Diagnosis not present

## 2014-07-29 DIAGNOSIS — I270 Primary pulmonary hypertension: Secondary | ICD-10-CM | POA: Diagnosis not present

## 2014-07-29 DIAGNOSIS — I2781 Cor pulmonale (chronic): Secondary | ICD-10-CM

## 2014-07-29 DIAGNOSIS — I272 Pulmonary hypertension, unspecified: Secondary | ICD-10-CM

## 2014-07-29 DIAGNOSIS — Z9989 Dependence on other enabling machines and devices: Secondary | ICD-10-CM

## 2014-07-29 NOTE — Patient Instructions (Signed)
Use oxygen 24 hours per day Use BiPAP machine whenever you are asleep, and use this with your oxygen when asleep Will try to arrange for more portable home oxygen set up Follow up in 6 months

## 2014-07-29 NOTE — Progress Notes (Signed)
Chief Complaint  Patient presents with  . Follow-up    Wears BiPAP nightly. States that he is still waking up with puffy eyes and some stomach pains in AM.  Denies any breathing issues. Pt did not bring O2 with him today, O2 sats 68% on RA, recovered to 93% on 3L.     History of Present Illness: Douglas [name]. is a 29 y.o. male former smoker with chronic respiratory failure from OSA, OHS, cor pulmonale, and diastolic CHF.  He has been trying to use BiPAP on a more regular basis.  He cut his hair and shaved his beard >> his mask fits better.  He did not have his oxygen with him today.  He says it is difficult for him to carry bigger tanks.  His SpO2 was 68% on room air >> improved with 3 liters oxygen.  He denies chest pain, cough, or leg swelling.  TESTS: PSG 08/14/09 >> AHI 105.4, SaO2 low 56%, BiPAP 21/17 cm H2O Echo 11/01/13 >> severe LVH, EF 55 to 60%, mod RA/RV dilation V/Q scan 11/01/13 >> upper lobe airtrapping b/l Split 02/05/14 >> AHI 78.2, SaO2 low 52%. CPAP 13 cm H2O >> AHI 5.4, still had low SaO2 >> centrals with higher CPAP/EPAP. ONO with 5 liters 02/27/14 >> test time 58 min. Basal SpO2 66%, low SpO2 50%. Spent 55 min with SpO2 < 88%. Echo 03/20/14 >> mild LVH, EF 60 to 59%, mod RV systolic dysfx  Past medical hx >> HTN, HLD  Past surgical hx, Medications, Allergies, Family hx, Social hx all reviewed.   Physical Exam: Blood pressure 132/84, pulse 94, temperature 98 F (36.7 C), temperature source Oral, height 5\' 4"  (1.626 m), weight 333 lb 12.8 oz (151.411 kg), SpO2 95 %. Body mass index is 49.75 kg/(m^2).  General - no distress ENT - No sinus tenderness, no oral exudate, no LAN, MP 4 Cardiac - s1s2 regular, no murmur Chest - no wheeze/crackles Back - No focal tenderness Abd - soft, non tender Ext - no edema Neuro - Normal strength Skin - venous stasis changes Psych - pleasant   Assessment/Plan:  Obstructive sleep apnea. Plan: - he is using BiPAP  21/17 cm  - advised him to use BiPAP whenever he is asleep  Obesity hypoventilation syndrome. Plan: - explained importance of using oxygen 24/7 - he is to continue 3 liters oxygen - will try to arrange for more portable oxygen set up - safe driving practices were discussed  Morbid obesity Plan: - reviewed importance of weight loss and discussed techniques to help with this  Diastolic heart failure Plan: - f/u with PCP and cardiology   [name], MD [place] Pager:  [phone]

## 2014-09-10 ENCOUNTER — Telehealth: Payer: Self-pay | Admitting: Cardiology

## 2014-09-10 NOTE — Telephone Encounter (Signed)
Spoke to patient.  He reports weight up, fluid in legs and ankles. No dyspnea beyond "normal" per patient.  He reports gap loss of medicaid, was w/o medications also for about 1 week. PCP recently renewed meds.  Was instructed at that time to take furosemide BID, 80mg  in AM & 40mg  in PM x 2 days, then resume 40mg  BID.  He is currently taking 40mg  BID. Advised extra 40mg  x next 2 days, call on Friday if still problems or new concerns.   He would like to be seen in office if not better - advised we could do PA OV - can discuss Friday.  Advised if he runs low on meds to contact us to prevent interruption to daily administration. Pt voiced understanding.

## 2014-09-10 NOTE — Telephone Encounter (Signed)
Msg sent to scheduling

## 2014-09-10 NOTE — Telephone Encounter (Signed)
New problem    Pt stated he has fluid around his heart and need to speak to nurse.

## 2014-09-10 NOTE — Telephone Encounter (Signed)
Needs PAOV Kirk [place]s

## 2014-09-13 ENCOUNTER — Inpatient Hospital Stay (HOSPITAL_COMMUNITY)
Admission: EM | Admit: 2014-09-13 | Discharge: 2014-09-21 | DRG: 291 | Disposition: A | Discharge status: Home Health | Payer: Medicaid Other | Attending: Internal Medicine | Admitting: Internal Medicine

## 2014-09-13 ENCOUNTER — Encounter (HOSPITAL_COMMUNITY): Payer: Self-pay | Admitting: Vascular Surgery

## 2014-09-13 ENCOUNTER — Emergency Department (HOSPITAL_COMMUNITY): Payer: Medicaid Other

## 2014-09-13 DIAGNOSIS — N182 Chronic kidney disease, stage 2 (mild): Secondary | ICD-10-CM | POA: Diagnosis present

## 2014-09-13 DIAGNOSIS — J9602 Acute respiratory failure with hypercapnia: Secondary | ICD-10-CM

## 2014-09-13 DIAGNOSIS — E662 Morbid (severe) obesity with alveolar hypoventilation: Secondary | ICD-10-CM | POA: Diagnosis present

## 2014-09-13 DIAGNOSIS — E872 Acidosis: Secondary | ICD-10-CM | POA: Diagnosis present

## 2014-09-13 DIAGNOSIS — G4733 Obstructive sleep apnea (adult) (pediatric): Secondary | ICD-10-CM | POA: Diagnosis not present

## 2014-09-13 DIAGNOSIS — Z9114 Patient's other noncompliance with medication regimen: Secondary | ICD-10-CM | POA: Diagnosis present

## 2014-09-13 DIAGNOSIS — Z9981 Dependence on supplemental oxygen: Secondary | ICD-10-CM

## 2014-09-13 DIAGNOSIS — I272 Other secondary pulmonary hypertension: Secondary | ICD-10-CM | POA: Diagnosis present

## 2014-09-13 DIAGNOSIS — E669 Obesity, unspecified: Secondary | ICD-10-CM | POA: Diagnosis not present

## 2014-09-13 DIAGNOSIS — E876 Hypokalemia: Secondary | ICD-10-CM | POA: Diagnosis present

## 2014-09-13 DIAGNOSIS — H1030 Unspecified acute conjunctivitis, unspecified eye: Secondary | ICD-10-CM | POA: Diagnosis present

## 2014-09-13 DIAGNOSIS — I5030 Unspecified diastolic (congestive) heart failure: Secondary | ICD-10-CM

## 2014-09-13 DIAGNOSIS — Z8249 Family history of ischemic heart disease and other diseases of the circulatory system: Secondary | ICD-10-CM | POA: Diagnosis not present

## 2014-09-13 DIAGNOSIS — R601 Generalized edema: Secondary | ICD-10-CM | POA: Diagnosis not present

## 2014-09-13 DIAGNOSIS — Z7982 Long term (current) use of aspirin: Secondary | ICD-10-CM

## 2014-09-13 DIAGNOSIS — Z6841 Body Mass Index (BMI) 40.0 and over, adult: Secondary | ICD-10-CM | POA: Diagnosis not present

## 2014-09-13 DIAGNOSIS — J449 Chronic obstructive pulmonary disease, unspecified: Secondary | ICD-10-CM | POA: Diagnosis present

## 2014-09-13 DIAGNOSIS — I2781 Cor pulmonale (chronic): Secondary | ICD-10-CM | POA: Diagnosis present

## 2014-09-13 DIAGNOSIS — J9622 Acute and chronic respiratory failure with hypercapnia: Secondary | ICD-10-CM | POA: Diagnosis present

## 2014-09-13 DIAGNOSIS — I10 Essential (primary) hypertension: Secondary | ICD-10-CM

## 2014-09-13 DIAGNOSIS — J9621 Acute and chronic respiratory failure with hypoxia: Secondary | ICD-10-CM | POA: Diagnosis present

## 2014-09-13 DIAGNOSIS — Z79899 Other long term (current) drug therapy: Secondary | ICD-10-CM | POA: Diagnosis not present

## 2014-09-13 DIAGNOSIS — Z87891 Personal history of nicotine dependence: Secondary | ICD-10-CM | POA: Diagnosis not present

## 2014-09-13 DIAGNOSIS — I5033 Acute on chronic diastolic (congestive) heart failure: Secondary | ICD-10-CM | POA: Diagnosis present

## 2014-09-13 DIAGNOSIS — I129 Hypertensive chronic kidney disease with stage 1 through stage 4 chronic kidney disease, or unspecified chronic kidney disease: Secondary | ICD-10-CM | POA: Diagnosis present

## 2014-09-13 DIAGNOSIS — J9601 Acute respiratory failure with hypoxia: Secondary | ICD-10-CM | POA: Diagnosis not present

## 2014-09-13 DIAGNOSIS — E780 Pure hypercholesterolemia: Secondary | ICD-10-CM | POA: Diagnosis present

## 2014-09-13 DIAGNOSIS — R32 Unspecified urinary incontinence: Secondary | ICD-10-CM | POA: Diagnosis not present

## 2014-09-13 DIAGNOSIS — I5032 Chronic diastolic (congestive) heart failure: Secondary | ICD-10-CM | POA: Diagnosis present

## 2014-09-13 LAB — CBC WITH DIFFERENTIAL/PLATELET
Basophils Absolute: 0 10*3/uL (ref 0.0–0.1)
Basophils Relative: 0 % (ref 0–1)
EOS PCT: 1 % (ref 0–5)
Eosinophils Absolute: 0.1 10*3/uL (ref 0.0–0.7)
HCT: 54.6 % — ABNORMAL HIGH (ref 39.0–52.0)
Hemoglobin: 14.5 g/dL (ref 13.0–17.0)
LYMPHS ABS: 1.4 10*3/uL (ref 0.7–4.0)
LYMPHS PCT: 23 % (ref 12–46)
MCH: 25 pg — AB (ref 26.0–34.0)
MCHC: 26.6 g/dL — AB (ref 30.0–36.0)
MCV: 94.3 fL (ref 78.0–100.0)
MONO ABS: 1 10*3/uL (ref 0.1–1.0)
Monocytes Relative: 17 % — ABNORMAL HIGH (ref 3–12)
Neutro Abs: 3.5 10*3/uL (ref 1.7–7.7)
Neutrophils Relative %: 59 % (ref 43–77)
PLATELETS: 195 10*3/uL (ref 150–400)
RBC: 5.79 MIL/uL (ref 4.22–5.81)
RDW: 20.4 % — AB (ref 11.5–15.5)
WBC: 6 10*3/uL (ref 4.0–10.5)

## 2014-09-13 LAB — BASIC METABOLIC PANEL
Anion gap: 9 (ref 5–15)
BUN: 9 mg/dL (ref 6–20)
CALCIUM: 8.2 mg/dL — AB (ref 8.9–10.3)
CO2: 48 mmol/L — ABNORMAL HIGH (ref 22–32)
CREATININE: 0.98 mg/dL (ref 0.61–1.24)
Chloride: 79 mmol/L — ABNORMAL LOW (ref 101–111)
GFR calc Af Amer: >60 mL/min (ref >60)
GLUCOSE: 85 mg/dL (ref 65–99)
Potassium: 5.1 mmol/L (ref 3.5–5.1)
Sodium: 136 mmol/L (ref 135–145)

## 2014-09-13 LAB — BRAIN NATRIURETIC PEPTIDE: B Natriuretic Peptide: 364.6 pg/mL — ABNORMAL HIGH (ref 0.0–100.0)

## 2014-09-13 MED ORDER — LISINOPRIL 2.5 MG PO TABS
2.5000 mg | ORAL_TABLET | Freq: Every day | ORAL | Status: DC
  Administered 2014-09-14 – 2014-09-21 (×8): 2.5 mg via ORAL
  Filled 2014-09-13 (×9): qty 1

## 2014-09-13 MED ORDER — FUROSEMIDE 10 MG/ML IJ SOLN
80.0000 mg | Freq: Once | INTRAMUSCULAR | Status: DC
  Filled 2014-09-13: qty 8

## 2014-09-13 MED ORDER — POTASSIUM CHLORIDE CRYS ER 10 MEQ PO TBCR
10.0000 meq | EXTENDED_RELEASE_TABLET | Freq: Every day | ORAL | Status: DC
  Filled 2014-09-13: qty 1

## 2014-09-13 MED ORDER — AMLODIPINE BESYLATE 5 MG PO TABS
5.0000 mg | ORAL_TABLET | Freq: Every day | ORAL | Status: DC
  Filled 2014-09-13: qty 1

## 2014-09-13 MED ORDER — ONDANSETRON HCL 4 MG/2ML IJ SOLN: 4.0000 mg | Freq: Four times a day (QID) | INTRAMUSCULAR | Status: DC | PRN

## 2014-09-13 MED ORDER — FUROSEMIDE 10 MG/ML IJ SOLN
80.0000 mg | Freq: Two times a day (BID) | INTRAMUSCULAR | Status: DC
  Administered 2014-09-14 (×2): 80 mg via INTRAVENOUS
  Filled 2014-09-13 (×4): qty 8

## 2014-09-13 MED ORDER — FUROSEMIDE 10 MG/ML IJ SOLN
40.0000 mg | Freq: Once | INTRAMUSCULAR | Status: AC
  Administered 2014-09-13: 40 mg via INTRAVENOUS

## 2014-09-13 MED ORDER — SODIUM CHLORIDE 0.9 % IV SOLN
250.0000 mL | INTRAVENOUS | Status: DC | PRN
  Administered 2014-09-16: 250 mL via INTRAVENOUS

## 2014-09-13 MED ORDER — ENOXAPARIN SODIUM 40 MG/0.4ML ~~LOC~~ SOLN
40.0000 mg | SUBCUTANEOUS | Status: DC
  Administered 2014-09-14 – 2014-09-18 (×4): 40 mg via SUBCUTANEOUS
  Filled 2014-09-13 (×6): qty 0.4

## 2014-09-13 MED ORDER — ACETAMINOPHEN 325 MG PO TABS
650.0000 mg | ORAL_TABLET | ORAL | Status: DC | PRN
  Administered 2014-09-15 – 2014-09-19 (×2): 650 mg via ORAL
  Filled 2014-09-13 (×2): qty 2

## 2014-09-13 MED ORDER — FUROSEMIDE 10 MG/ML IJ SOLN
40.0000 mg | Freq: Once | INTRAMUSCULAR | Status: AC
  Administered 2014-09-13: 40 mg via INTRAMUSCULAR

## 2014-09-13 MED ORDER — MORPHINE SULFATE (PF) 2 MG/ML IV SOLN: 1.0000 mg | INTRAVENOUS | Status: DC | PRN

## 2014-09-13 MED ORDER — CARVEDILOL 3.125 MG PO TABS
3.1250 mg | ORAL_TABLET | Freq: Two times a day (BID) | ORAL | Status: DC
  Administered 2014-09-14 – 2014-09-21 (×15): 3.125 mg via ORAL
  Filled 2014-09-13 (×17): qty 1

## 2014-09-13 MED ORDER — SODIUM CHLORIDE 0.9 % IJ SOLN: 3.0000 mL | INTRAMUSCULAR | Status: DC | PRN

## 2014-09-13 MED ORDER — SODIUM CHLORIDE 0.9 % IJ SOLN
3.0000 mL | Freq: Two times a day (BID) | INTRAMUSCULAR | Status: DC
  Administered 2014-09-13 – 2014-09-20 (×12): 3 mL via INTRAVENOUS

## 2014-09-13 MED ORDER — ASPIRIN EC 325 MG PO TBEC
325.0000 mg | DELAYED_RELEASE_TABLET | Freq: Every day | ORAL | Status: DC
  Administered 2014-09-14 – 2014-09-17 (×4): 325 mg via ORAL
  Filled 2014-09-13 (×5): qty 1

## 2014-09-13 NOTE — H&P (Addendum)
[facility] Admission History and Physical       Douglas [name]. FMB:[id number] DOB: 07/26/1985 DOA: 09/13/2014  Referring physician: EDP PCP: Philis Fendt, MD  Specialists:   Chief Complaint:  Increased SOB and Swelling of Both Legs  HPI: Douglas [name]. is a 29 y.o. male with a history of Chronic Diastolic CHF EF = 70-17%, Chronic Respiratory Failure on 3 liters NCO2 continuously, HTN, OSA who presents to the ED with complaints of worsening SOB and Edema extending from both of his legs into his ABD over the past 3 days.  He reports not taking his Lasix Rx x 1 week, and he restarted a few days ago.   He denies any Chest pain or Cough or Fevers or Chills.   EMS was called to his home and he was found to have hypoxia to 88% and his oxygen was increased to 4 liters with an increase in his O2 sats to 92%.   He was found to have Vascular Congestion and Cardiomegaly on Chest X-Ray and was administered 80 mg of IV Lasix x 1 and referred for medical admission   Review of Systems:  Constitutional: No Weight Loss, No Weight Gain, Night Sweats, Fevers, Chills, Dizziness, Light Headedness, Fatigue, or Generalized Weakness HEENT: No Headaches, Difficulty Swallowing,Tooth/Dental Problems,Sore Throat,  No Sneezing, Rhinitis, Ear Ache, Nasal Congestion, or Post Nasal Drip,  Cardio-vascular:  No Chest pain, +Orthopnea, PND, +Edema in Lower Extremities, Anasarca, Dizziness, Palpitations  Resp: +Dyspnea, No DOE, No Productive Cough, No Non-Productive Cough, No Hemoptysis, No Wheezing.    GI: No Heartburn, Indigestion, Abdominal Pain, Nausea, Vomiting, Diarrhea, Constipation, Hematemesis, Hematochezia, Melena, Change in Bowel Habits,  Loss of Appetite  GU: No Dysuria, No Change in Color of Urine, No Urgency or Urinary Frequency, No Flank pain.  Musculoskeletal: No Joint Pain or Swelling, No Decreased Range of Motion, No Back Pain.  Neurologic: No Syncope, No Seizures, Muscle Weakness,  Paresthesia, Vision Disturbance or Loss, No Diplopia, No Vertigo, No Difficulty Walking,  Skin: No Rash or Lesions. Psych: No Change in Mood or Affect, No Depression or Anxiety, No Memory loss, No Confusion, or Hallucinations   Past Medical History  Diagnosis Date  . Hypertension   . Obesity   . Tobacco abuse   . Substance abuse 8/31/213    cociane "first time use"  . High cholesterol   . CHF (congestive heart failure)   . Chronic bronchitis   . OSA on CPAP   . Arthritis     "hands, feet" (10/31/2013)  . Pneumonia 09/2011     Past Surgical History  Procedure Laterality Date  . No past surgeries    . Tubes in ears        Prior to Admission medications   Medication Sig Start Date End Date Taking? Authorizing Provider  albuterol (PROVENTIL HFA;VENTOLIN HFA) 108 (90 BASE) MCG/ACT inhaler Inhale 1-2 puffs into the lungs every 6 (six) hours as needed for wheezing or shortness of breath. 11/05/13  Yes Shanker Kristeen Mans, MD  amLODipine (NORVASC) 5 MG tablet Take 1 tablet (5 mg total) by mouth daily. 06/12/14  Yes Maryann Mikhail, DO  furosemide (LASIX) 40 MG tablet Take 1 tablet (40 mg total) by mouth 2 (two) times daily. 01/27/14  Yes Evelina Bucy, MD  aspirin EC 325 MG EC tablet Take 1 tablet (325 mg total) by mouth daily. 11/05/13   Shanker Kristeen Mans, MD     No Known Allergies    Social History:  reports that he quit smoking about 10 months ago. His smoking use included Cigarettes. He has a 4.5 pack-year smoking history. He has never used smokeless tobacco. He reports that he uses illicit drugs (Cocaine). He reports that he does not drink alcohol.     Family History  Problem Relation Age of Onset  . Heart disease Mother     Pacemaker  . Heart attack Neg Hx   . Stroke Neg Hx   . Hypertension Father   . Cancer Paternal Grandfather        Physical Exam:  GEN:  Pleasant Morbidly Obese  29 y.o. African American male examined and in no acute distress; cooperative with  exam Filed Vitals:   09/13/14 2030 09/13/14 2115 09/13/14 2120 09/13/14 2123  BP: 113/68 117/78    Pulse: 108 98    Temp:      TempSrc:      Resp: 15 16    SpO2: 91% 90% 92% 98%   Blood pressure 117/78, pulse 98, temperature 98.1 F (36.7 C), temperature source Oral, resp. rate 16, SpO2 98 %. PSYCH: He is alert and oriented x4; does not appear anxious does not appear depressed; affect is normal HEENT: Normocephalic and Atraumatic, Mucous membranes pink; PERRLA; EOM intact; Fundi:  Benign;  No scleral icterus, Nares: Patent, Oropharynx: Clear, Fair Dentition,    Neck:  FROM, No Cervical Lymphadenopathy nor Thyromegaly or Carotid Bruit; No JVD; Breasts:: Not examined CHEST WALL: No tenderness CHEST: Normal respiration, clear to auscultation bilaterally HEART: Regular rate and rhythm; no murmurs rubs or gallops BACK: No kyphosis or scoliosis; No CVA tenderness ABDOMEN: Positive Bowel Sounds, Obese, Soft Non-Tender, No Rebound or Guarding; No Masses, No Organomegaly. Rectal Exam: Not done EXTREMITIES: No Cyanosis, Clubbing, 2+Edema to BLEs; No Ulcerations. Genitalia: not examined PULSES: 2+ and symmetric SKIN: Normal hydration no rash or ulceration CNS:  Alert and Oriented x 4, No Focal Deficits Vascular: pulses palpable throughout    Labs on Admission:  Basic Metabolic Panel:  Recent Labs Lab 09/13/14 1856  NA 136  K 5.1  CL 79*  CO2 48*  GLUCOSE 85  BUN 9  CREATININE 0.98  CALCIUM 8.2*   Liver Function Tests: No results for input(s): AST, ALT, ALKPHOS, BILITOT, PROT, ALBUMIN in the last 168 hours. No results for input(s): LIPASE, AMYLASE in the last 168 hours. No results for input(s): AMMONIA in the last 168 hours. CBC:  Recent Labs Lab 09/13/14 1856  WBC 6.0  NEUTROABS 3.5  HGB 14.5  HCT 54.6*  MCV 94.3  PLT 195   Cardiac Enzymes: No results for input(s): CKTOTAL, CKMB, CKMBINDEX, TROPONINI in the last 168 hours.  BNP (last 3 results)  Recent Labs   03/21/14 0400 06/09/14 1133 09/13/14 1856  BNP 163.7* 347.5* 364.6*    ProBNP (last 3 results)  Recent Labs  10/31/13 1200  PROBNP 2527.0*    CBG: No results for input(s): GLUCAP in the last 168 hours.  Radiological Exams on Admission: Dg Chest 2 View  09/13/2014   CLINICAL DATA:  Acute onset of shortness of breath and lower extremity and abdominal soft tissue swelling. Initial encounter.  EXAM: CHEST  2 VIEW  COMPARISON:  Chest radiograph performed 06/11/2014  FINDINGS: The lungs are well-aerated. Vascular congestion is noted, with mildly increased interstitial markings, raising question for mild interstitial edema. There is no evidence of pleural effusion or pneumothorax.  The heart is enlarged.  No acute osseous abnormalities are seen.  IMPRESSION: Vascular congestion and cardiomegaly,  with mildly increased interstitial markings, raising question for mild interstitial edema.   Electronically Signed   By: Garald Balding M.D.   On: 09/13/2014 20:03     EKG: Independently reviewed. Sinus Tachycardia Rate =102, No acute S-T Changes   Assessment/Plan:   29 y.o. male with  Principal Problem:   1.    Acute on chronic diastolic CHF (congestive heart failure), NYHA class 3   Acute CHF Protocol and Diurese with IV Lasix 80 mg   2D ECHO done in  03/20/2014   Active Problems:   2.    Acute on chronic respiratory failure with hypoxia   NCO2 at 4 liters     3.    Hypertension   Continue Amlodipine and Lasix Rx   Monitor BPs     4.     High cholesterol    Not on cholesterol       5.     OSA treated with BiPAP   BIPAP qhs     6.    DVT Prophylaxis   Lovenox          Code Status:     FULL CODE        Family Communication:    No Family Present    Disposition Plan:    Inpatient  Status        Time spent:  [facility] Pager [phone]   If [place] Please Contact the Day Rounding Team MD for [facility]  If 7PM-7AM,  Please Contact Night-Floor Coverage  [email or web address] Password [email or web address] 09/13/2014, 9:30 PM     ADDENDUM:   Patient was seen and examined on 09/13/2014

## 2014-09-13 NOTE — ED Provider Notes (Signed)
CSN: [id number]     Arrival date & time 09/13/14  19 History   First MD Initiated Contact with Patient 09/13/14 1558     Chief Complaint  Patient presents with  . Shortness of Breath  . Congestive Heart Failure     (Consider location/radiation/quality/duration/timing/severity/associated sxs/prior Treatment) Patient is a 29 y.o. male presenting with shortness of breath and CHF. The history is provided by the patient (the pt states he missed his lasix for one week. he is back on his lasix but he has increased swelling in legs and abd).  Shortness of Breath Severity:  Mild Onset quality:  Gradual Timing:  Constant Progression:  Unchanged Chronicity:  Recurrent Context: not animal exposure   Associated symptoms: no abdominal pain, no chest pain, no cough, no headaches and no rash   Congestive Heart Failure Associated symptoms include shortness of breath. Pertinent negatives include no chest pain, no abdominal pain and no headaches.    Past Medical History  Diagnosis Date  . Hypertension   . Obesity   . Tobacco abuse   . Substance abuse 8/31/213    cociane "first time use"  . High cholesterol   . CHF (congestive heart failure)   . Chronic bronchitis   . OSA on CPAP   . Arthritis     "hands, feet" (10/31/2013)  . Pneumonia 09/2011   Past Surgical History  Procedure Laterality Date  . No past surgeries    . Tubes in ears     Family History  Problem Relation Age of Onset  . Heart disease Mother     Pacemaker  . Heart attack Neg Hx   . Stroke Neg Hx   . Hypertension Father   . Cancer Paternal Grandfather    Social History  Substance Use Topics  . Smoking status: Former Smoker -- 0.50 packs/day for 9 years    Types: Cigarettes    Quit date: 10/31/2013  . Smokeless tobacco: Never Used  . Alcohol Use: No    Review of Systems  Constitutional: Negative for appetite change and fatigue.  HENT: Negative for congestion, ear discharge and sinus pressure.   Eyes: Negative  for discharge.  Respiratory: Positive for shortness of breath. Negative for cough.   Cardiovascular: Negative for chest pain.  Gastrointestinal: Negative for abdominal pain and diarrhea.  Genitourinary: Negative for frequency and hematuria.  Musculoskeletal: Negative for back pain.       Edema in legs and abd  Skin: Negative for rash.  Neurological: Negative for seizures and headaches.  Psychiatric/Behavioral: Negative for hallucinations.      Allergies  Review of patient's allergies indicates no known allergies.  Home Medications   Prior to Admission medications   Medication Sig Start Date End Date Taking? Authorizing Provider  albuterol (PROVENTIL HFA;VENTOLIN HFA) 108 (90 BASE) MCG/ACT inhaler Inhale 1-2 puffs into the lungs every 6 (six) hours as needed for wheezing or shortness of breath. 11/05/13  Yes Shanker Kristeen Mans, MD  amLODipine (NORVASC) 5 MG tablet Take 1 tablet (5 mg total) by mouth daily. 06/12/14  Yes Maryann Mikhail, DO  furosemide (LASIX) 40 MG tablet Take 1 tablet (40 mg total) by mouth 2 (two) times daily. 01/27/14  Yes Evelina Bucy, MD  aspirin EC 325 MG EC tablet Take 1 tablet (325 mg total) by mouth daily. 11/05/13   Shanker Kristeen Mans, MD   BP 109/80 mmHg  Pulse 102  Temp(Src) 98.1 F (36.7 C) (Oral)  Resp 12  SpO2 88% Physical Exam  Constitutional: He is oriented to person, place, and time. He appears well-developed.  HENT:  Head: Normocephalic.  Eyes: Conjunctivae and EOM are normal. No scleral icterus.  Neck: Neck supple. No thyromegaly present.  Cardiovascular: Normal rate and regular rhythm.  Exam reveals no gallop and no friction rub.   No murmur heard. Pulmonary/Chest: No stridor. He has no wheezes. He has no rales. He exhibits no tenderness.  Abdominal: He exhibits no distension. There is no tenderness. There is no rebound.  Edema up to umbilicus  Musculoskeletal: He exhibits no edema.  3 plus edema through out is legs  Lymphadenopathy:    He  has no cervical adenopathy.  Neurological: He is oriented to person, place, and time. He exhibits normal muscle tone. Coordination normal.  Skin: No rash noted. No erythema.  Psychiatric: He has a normal mood and affect. His behavior is normal.    ED Course  Procedures (including critical care time) Labs Review Labs Reviewed  CBC WITH DIFFERENTIAL/PLATELET - Abnormal; Notable for the following:    HCT 54.6 (*)    MCH 25.0 (*)    MCHC 26.6 (*)    RDW 20.4 (*)    Monocytes Relative 17 (*)    All other components within normal limits  BASIC METABOLIC PANEL - Abnormal; Notable for the following:    Chloride 79 (*)    CO2 48 (*)    Calcium 8.2 (*)    All other components within normal limits  BRAIN NATRIURETIC PEPTIDE - Abnormal; Notable for the following:    B Natriuretic Peptide 364.6 (*)    All other components within normal limits    Imaging Review Dg Chest 2 View  09/13/2014   CLINICAL DATA:  Acute onset of shortness of breath and lower extremity and abdominal soft tissue swelling. Initial encounter.  EXAM: CHEST  2 VIEW  COMPARISON:  Chest radiograph performed 06/11/2014  FINDINGS: The lungs are well-aerated. Vascular congestion is noted, with mildly increased interstitial markings, raising question for mild interstitial edema. There is no evidence of pleural effusion or pneumothorax.  The heart is enlarged.  No acute osseous abnormalities are seen.  IMPRESSION: Vascular congestion and cardiomegaly, with mildly increased interstitial markings, raising question for mild interstitial edema.   Electronically Signed   By: Garald Balding M.D.   On: 09/13/2014 20:03   I have personally reviewed and evaluated these images and lab results as part of my medical decision-making.   EKG Interpretation   Date/Time:  Saturday September 13 2014 16:02:29 EDT Ventricular Rate:  102 PR Interval:  165 QRS Duration: 73 QT Interval:  364 QTC Calculation: 474 R Axis:   150 Text Interpretation:   Sinus tachycardia Right axis deviation Low voltage,  precordial leads Borderline prolonged QT interval Baseline wander in  lead(s) V1 V2 Confirmed by Favio Moder  MD, Broadus John ([id number]) on 09/13/2014 4:42:21  PM Also confirmed by Thia Olesen  MD, Broadus John [phone])  on 09/13/2014 8:21:01 PM      MDM   Final diagnoses:  Diastolic congestive heart failure, unspecified congestive heart failure chronicity    Anasarca,  Admit to diuresis   Milton Ferguson, MD 09/13/14 2049

## 2014-09-13 NOTE — Progress Notes (Signed)
Pt was c/o s/s of a eye infection wanted to see a eye doctor for this, thanks, Arvella Nigh RN

## 2014-09-13 NOTE — ED Notes (Signed)
IV team at bedside 

## 2014-09-13 NOTE — ED Notes (Signed)
Pt report to the ED via [facility] from home for eval of increased SOB and fluid retention. Pt has a hx of CHF and has been non-complaint with his Lasix per parents. Pt was found to be 84% on RA he was placed on 4 L and his sats improved to 91%. 12 lead en route unremarkable. Pt denies any CP at this time. Pt A&Ox4, resp e/u, and skin warm and dry.

## 2014-09-13 NOTE — ED Notes (Addendum)
2 RNs attempted to obtain IV access on patient. Both unsuccessful. IV team paged.

## 2014-09-14 LAB — BASIC METABOLIC PANEL
ANION GAP: 10 (ref 5–15)
BUN: 9 mg/dL (ref 6–20)
CHLORIDE: 79 mmol/L — AB (ref 101–111)
CO2: 48 mmol/L — AB (ref 22–32)
Calcium: 8.4 mg/dL — ABNORMAL LOW (ref 8.9–10.3)
Creatinine, Ser: 1.02 mg/dL (ref 0.61–1.24)
GFR calc non Af Amer: >60 mL/min (ref >60)
Glucose, Bld: 95 mg/dL (ref 65–99)
Potassium: 3.4 mmol/L — ABNORMAL LOW (ref 3.5–5.1)
Sodium: 137 mmol/L (ref 135–145)

## 2014-09-14 MED ORDER — POTASSIUM CHLORIDE CRYS ER 20 MEQ PO TBCR
40.0000 meq | EXTENDED_RELEASE_TABLET | Freq: Once | ORAL | Status: AC
  Administered 2014-09-14: 40 meq via ORAL

## 2014-09-14 MED ORDER — ERYTHROMYCIN 5 MG/GM OP OINT
TOPICAL_OINTMENT | Freq: Two times a day (BID) | OPHTHALMIC | Status: DC
Start: 2014-09-14 — End: 2014-09-14
  Filled 2014-09-14: qty 3.5

## 2014-09-14 MED ORDER — CIPROFLOXACIN HCL 0.3 % OP SOLN
1.0000 [drp] | OPHTHALMIC | Status: DC
  Administered 2014-09-14 – 2014-09-20 (×35): 1 [drp] via OPHTHALMIC
  Filled 2014-09-14 (×2): qty 2.5

## 2014-09-14 MED ORDER — ALBUTEROL SULFATE (2.5 MG/3ML) 0.083% IN NEBU
2.5000 mg | INHALATION_SOLUTION | Freq: Four times a day (QID) | RESPIRATORY_TRACT | Status: DC | PRN
Start: 2014-09-14 — End: 2014-09-21
  Administered 2014-09-14: 2.5 mg via RESPIRATORY_TRACT
  Filled 2014-09-14: qty 3

## 2014-09-14 NOTE — Progress Notes (Signed)
Utilization Review Completed.Douglas [name] T8/21/2016  

## 2014-09-14 NOTE — Progress Notes (Signed)
Placed patient on CPAP 5cmH20 with 7L O2 bleed in via large full face mask.  Patient is tolerating well at this time.

## 2014-09-14 NOTE — Progress Notes (Signed)
[facility] PROGRESS NOTE  Douglas [name]. KDX:[id number] DOB: 08/11/1985 DOA: 09/13/2014 PCP: Philis Fendt, MD  Assessment/Plan: Acute on chronic diastolic CHF (congestive heart failure), NYHA class 3; Last ECHO Ef 65 %. Presents with SOB, chest x ray with vascular congestion, mild interstitial edema. Was not taking lasix for 1 week. He ran out of lasix.  Urine out put 1.05L. Weight: 140. His usual weight is 265 pounds.  Continue with lasix 80 mg IV BID.   Acute on chronic respiratory failure with hypoxia; Secondary to HF, cor pulmonale, OSA.   Hypertension; will hold Norvasc to avoid hypotension.   OSA treated with BiPAP;  Hypokalemia; replaced.   Conjunctivitis: start ciprofloxacin eye drop.    Code Status: Full code.  Family Communication: care discussed with patient.  Disposition Plan: remain inpatient for treatment of HF.    Consultants:  none  Procedures:  none  Antibiotics:  none  HPI/Subjective: Breathing better. He ran out of lasix for 1 week.  He is complaining of bilateral eye drainage, blurry vision in am.   Objective: Filed Vitals:   09/14/14 0548  BP: 120/83  Pulse: 95  Temp: 98.8 F (37.1 C)  Resp: 20    Intake/Output Summary (Last 24 hours) at 09/14/14 0854 Last data filed at 09/13/14 2328  Gross per 24 hour  Intake      3 ml  Output   1050 ml  Net  -1047 ml   Filed Weights   09/13/14 2151 09/14/14 0548  Weight: 140.706 kg (310 lb 3.2 oz) 140.252 kg (309 lb 3.2 oz)    Exam:   General:  Alert in no distress.   Cardiovascular: S 1, S 2 RRR  Respiratory: Bilateral crackles, no wheezing.   Abdomen: BS present, soft, nt  Musculoskeletal: plus 2 edema.   Data Reviewed: Basic Metabolic Panel:  Recent Labs Lab 09/13/14 1856 09/14/14 0414  NA 136 137  K 5.1 3.4*  CL 79* 79*  CO2 48* 48*  GLUCOSE 85 95  BUN 9 9  CREATININE 0.98 1.02  CALCIUM 8.2* 8.4*   Liver Function Tests: No results for input(s):  AST, ALT, ALKPHOS, BILITOT, PROT, ALBUMIN in the last 168 hours. No results for input(s): LIPASE, AMYLASE in the last 168 hours. No results for input(s): AMMONIA in the last 168 hours. CBC:  Recent Labs Lab 09/13/14 1856  WBC 6.0  NEUTROABS 3.5  HGB 14.5  HCT 54.6*  MCV 94.3  PLT 195   Cardiac Enzymes: No results for input(s): CKTOTAL, CKMB, CKMBINDEX, TROPONINI in the last 168 hours. BNP (last 3 results)  Recent Labs  03/21/14 0400 06/09/14 1133 09/13/14 1856  BNP 163.7* 347.5* 364.6*    ProBNP (last 3 results)  Recent Labs  10/31/13 1200  PROBNP 2527.0*    CBG: No results for input(s): GLUCAP in the last 168 hours.  No results found for this or any previous visit (from the past 240 hour(s)).   Studies: Dg Chest 2 View  09/13/2014   CLINICAL DATA:  Acute onset of shortness of breath and lower extremity and abdominal soft tissue swelling. Initial encounter.  EXAM: CHEST  2 VIEW  COMPARISON:  Chest radiograph performed 06/11/2014  FINDINGS: The lungs are well-aerated. Vascular congestion is noted, with mildly increased interstitial markings, raising question for mild interstitial edema. There is no evidence of pleural effusion or pneumothorax.  The heart is enlarged.  No acute osseous abnormalities are seen.  IMPRESSION: Vascular congestion and cardiomegaly, with mildly increased interstitial  markings, raising question for mild interstitial edema.   Electronically Signed   By: Garald Balding M.D.   On: 09/13/2014 20:03    Scheduled Meds: . amLODipine  5 mg Oral Daily  . aspirin EC  325 mg Oral Daily  . carvedilol  3.125 mg Oral BID WC  . enoxaparin (LOVENOX) injection  40 mg Subcutaneous Q24H  . erythromycin   Both Eyes BID  . furosemide  80 mg Intravenous Q12H  . lisinopril  2.5 mg Oral Daily  . potassium chloride  10 mEq Oral Daily  . sodium chloride  3 mL Intravenous Q12H   Continuous Infusions:   Principal Problem:   Acute on chronic diastolic CHF  (congestive heart failure), NYHA class 3 Active Problems:   Hypertension   OSA treated with BiPAP   Acute on chronic respiratory failure with hypoxia   High cholesterol    Time spent: 35 minutes.     Niel Hummer A  [facility] Pager [phone]. If 7PM-7AM, please contact night-coverage at [email or web address], password [facility] 09/14/2014, 8:54 AM  LOS: 1 day

## 2014-09-15 DIAGNOSIS — J9621 Acute and chronic respiratory failure with hypoxia: Secondary | ICD-10-CM

## 2014-09-15 DIAGNOSIS — I5033 Acute on chronic diastolic (congestive) heart failure: Principal | ICD-10-CM

## 2014-09-15 LAB — CBC
HEMATOCRIT: 56.9 % — AB (ref 39.0–52.0)
HEMOGLOBIN: 14.8 g/dL (ref 13.0–17.0)
MCH: 25 pg — AB (ref 26.0–34.0)
MCHC: 26 g/dL — ABNORMAL LOW (ref 30.0–36.0)
MCV: 96.3 fL (ref 78.0–100.0)
Platelets: 245 10*3/uL (ref 150–400)
RBC: 5.91 MIL/uL — AB (ref 4.22–5.81)
RDW: 20.3 % — ABNORMAL HIGH (ref 11.5–15.5)
WBC: 6.1 10*3/uL (ref 4.0–10.5)

## 2014-09-15 LAB — BASIC METABOLIC PANEL
ANION GAP: 10 (ref 5–15)
BUN: 12 mg/dL (ref 6–20)
CHLORIDE: 80 mmol/L — AB (ref 101–111)
CO2: 44 mmol/L — AB (ref 22–32)
Calcium: 8.1 mg/dL — ABNORMAL LOW (ref 8.9–10.3)
Creatinine, Ser: 1.1 mg/dL (ref 0.61–1.24)
GFR calc non Af Amer: >60 mL/min (ref >60)
Glucose, Bld: 95 mg/dL (ref 65–99)
Potassium: 4.2 mmol/L (ref 3.5–5.1)
Sodium: 134 mmol/L — ABNORMAL LOW (ref 135–145)

## 2014-09-15 MED ORDER — FUROSEMIDE 10 MG/ML IJ SOLN
80.0000 mg | Freq: Three times a day (TID) | INTRAMUSCULAR | Status: DC
  Administered 2014-09-15 – 2014-09-16 (×3): 80 mg via INTRAVENOUS
  Filled 2014-09-15 (×4): qty 8

## 2014-09-15 NOTE — Progress Notes (Signed)
[facility] PROGRESS NOTE  [name] [id number] DOB: 07-Jun-1985 DOA: 09/13/2014 PCP: Philis Fendt, MD  Brief narrative 29 year old morbidly obese male with history of diastolic CHF, OSA on CPAP, hypertension, chronic respiratory failure on 3 L. Nasal cannula at home, presented to the ED with worsening shortness of breath and anasarca in the setting of acute on chronic diastolic CHF. Patient was hypoxic when EMS arrived at home satting at 88% on his home O2 and oxygen was increased to 4 L (O2 sat improved to 92%). Chest X it done in the ED showed vascular congestion with cardiomegaly.  Assessment/Plan: Acute on chronic respiratory failure secondary to acute on chronic diastolic CHF (NYHA class III) Patient on IV Lasix 80 mg twice daily without much improvement. He still is also about 40 pounds above his baseline weight. Increase IV Lasix dose to 80 mg 3 times a day. Will consult cardiology if not diuresing adequately. Monitor strict I/O and daily weight. -Continue aspirin, Coreg and lisinopril.  Acute on chronic hypoxic respiratory failure Due to combination of acute diastolic CHF, sleep apnea and cor pulmonale. Monitor with diuresis.  OSA Continue nighttime CPAP  Hypokalemia Replenished  Acute conjunctivitis Appears to have improved. Continue Cipro eyedrops  Diet: Heart healthy Code Status: Full code Family Communication: None at bedside Disposition Plan: Continue telemetry monitoring   Consultants:  None  Procedures:  None  Antibiotics:  None  HPI/Subjective: Patient seen and examined. He reports his breathing to be slightly better.  Objective: Filed Vitals:   09/15/14 1024  BP: 127/83  Pulse: 100  Temp:   Resp:     Intake/Output Summary (Last 24 hours) at 09/15/14 1324 Last data filed at 09/15/14 1024  Gross per 24 hour  Intake   1615 ml  Output   1350 ml  Net    265 ml   Filed Weights   09/13/14 2151 09/14/14 0548 09/15/14  0649  Weight: 140.706 kg (310 lb 3.2 oz) 140.252 kg (309 lb 3.2 oz) 140.203 kg (309 lb 1.5 oz)    Exam:   General: Middle aged morbidly obese male in no acute distress  HEENT: No pallor, moist oral mucosa,  Cardiovascular: S1 and S2, no murmurs rub or gallop  Respiratory: Diminished bibasilar breath sounds  Abdomen: pitting abdominal wall edema nontender, nondistended  Musculoskeletal: 2+ pitting edema upto the thighs  Data Reviewed: Basic Metabolic Panel:  Recent Labs Lab 09/13/14 1856 09/14/14 0414 09/15/14 0340  NA 136 137 134*  K 5.1 3.4* 4.2  CL 79* 79* 80*  CO2 48* 48* 44*  GLUCOSE 85 95 95  BUN 9 9 12   CREATININE 0.98 1.02 1.10  CALCIUM 8.2* 8.4* 8.1*   Liver Function Tests: No results for input(s): AST, ALT, ALKPHOS, BILITOT, PROT, ALBUMIN in the last 168 hours. No results for input(s): LIPASE, AMYLASE in the last 168 hours. No results for input(s): AMMONIA in the last 168 hours. CBC:  Recent Labs Lab 09/13/14 1856 09/15/14 0340  WBC 6.0 6.1  NEUTROABS 3.5  --   HGB 14.5 14.8  HCT 54.6* 56.9*  MCV 94.3 96.3  PLT 195 245   Cardiac Enzymes: No results for input(s): CKTOTAL, CKMB, CKMBINDEX, TROPONINI in the last 168 hours. BNP (last 3 results)  Recent Labs  03/21/14 0400 06/09/14 1133 09/13/14 1856  BNP 163.7* 347.5* 364.6*    ProBNP (last 3 results)  Recent Labs  10/31/13 1200  PROBNP 2527.0*    CBG: No results for input(s): GLUCAP in the  last 168 hours.  No results found for this or any previous visit (from the past 240 hour(s)).   Studies: Dg Chest 2 View  09/13/2014   CLINICAL DATA:  Acute onset of shortness of breath and lower extremity and abdominal soft tissue swelling. Initial encounter.  EXAM: CHEST  2 VIEW  COMPARISON:  Chest radiograph performed 06/11/2014  FINDINGS: The lungs are well-aerated. Vascular congestion is noted, with mildly increased interstitial markings, raising question for mild interstitial edema. There  is no evidence of pleural effusion or pneumothorax.  The heart is enlarged.  No acute osseous abnormalities are seen.  IMPRESSION: Vascular congestion and cardiomegaly, with mildly increased interstitial markings, raising question for mild interstitial edema.   Electronically Signed   By: Garald Balding M.D.   On: 09/13/2014 20:03    Scheduled Meds: . aspirin EC  325 mg Oral Daily  . carvedilol  3.125 mg Oral BID WC  . ciprofloxacin  1 drop Both Eyes Q2H while awake  . enoxaparin (LOVENOX) injection  40 mg Subcutaneous Q24H  . furosemide  80 mg Intravenous 3 times per day  . lisinopril  2.5 mg Oral Daily  . sodium chloride  3 mL Intravenous Q12H   Continuous Infusions:     Time spent: [place] [place] [facility] Pager [phone] If 7PM-7AM, please contact night-coverage at [email or web address], password [facility] 09/15/2014, 1:24 PM  LOS: 2 days

## 2014-09-15 NOTE — Progress Notes (Signed)
Patient refused CPAP at this time.  Unit set up at bedside.  Patient states he will call if he decides to use the CPAP tonight.

## 2014-09-15 NOTE — Care Management Note (Signed)
Case Management Note  Patient Details  Name: Douglas [name]. MRN: [id number] Date of Birth: February 05, 1985  Subjective/Objective:  Admitted with CHF                  Action/Plan: Patient lives at home with his father. He states that he eats a heart health diet, does the cooking and does not eat out often except when his kids come by. PCP - Philis Fendt, MD / pharmacy of choice is [facility] and does not have any problems getting his medication except at times he does not have any money to cover the cost of his prescriptions. Insurance provider is Medicaid with prescription drug coverage ( cost of prescriptions are usually $3.00 or less). CM asked patient could his father help with the cost- pt not sure if he can but he said that he is on Disability and is working out a plan to have his medication at all times. CM also informed patient to notify his PCP is he does not have his medication. He has scales but do not weight himself daily but knows that he needs to. Very little physical activity, patient states that he is tired and sleeps a lot but wants to exercise. He has home 02 and use it every night. He does not have a car and is trying to get approved for [employer]. Difficult situation, patient could benefit from a Disease Management program for ongoing teaching/ education about his disease process. CM will continue to follow for DCP.  Expected Discharge Date:   possibly 09/19/2014               Expected Discharge Plan:  Home/Self Care  Discharge planning Services  CM Consult  Status of Service:  In process, will continue to follow  Sherrilyn Rist [phone] 09/15/2014, 10:48 AM

## 2014-09-15 NOTE — Progress Notes (Signed)
Patient can definitely benefit from a Disease Management Program for CHF; [place] choice offered, patient chose [facility]. Mary with [facility] called for arrangements. Attending MD please enter the face to face document for [facility] services. Mindi Slicker [facility] [phone]

## 2014-09-16 DIAGNOSIS — R601 Generalized edema: Secondary | ICD-10-CM

## 2014-09-16 LAB — BLOOD GAS, ARTERIAL
ACID-BASE EXCESS: 24.9 mmol/L — AB (ref 0.0–2.0)
BICARBONATE: 52.3 meq/L — AB (ref 20.0–24.0)
Drawn by: 441371
O2 CONTENT: 3 L/min
O2 Saturation: 89.9 %
PATIENT TEMPERATURE: 97.8
PCO2 ART: 100 mmHg — AB (ref 35.0–45.0)
PH ART: 7.335 — AB (ref 7.350–7.450)
PO2 ART: 64.6 mmHg — AB (ref 80.0–100.0)
TCO2: 55.4 mmol/L (ref 0–100)

## 2014-09-16 LAB — BASIC METABOLIC PANEL
BUN: 12 mg/dL (ref 6–20)
CHLORIDE: 78 mmol/L — AB (ref 101–111)
CREATININE: 1.18 mg/dL (ref 0.61–1.24)
Calcium: 8.7 mg/dL — ABNORMAL LOW (ref 8.9–10.3)
GFR calc Af Amer: >60 mL/min (ref >60)
GFR calc non Af Amer: >60 mL/min (ref >60)
Glucose, Bld: 90 mg/dL (ref 65–99)
Potassium: 3.3 mmol/L — ABNORMAL LOW (ref 3.5–5.1)
Sodium: 137 mmol/L (ref 135–145)

## 2014-09-16 LAB — MRSA PCR SCREENING: MRSA by PCR: NEGATIVE

## 2014-09-16 LAB — MAGNESIUM: MAGNESIUM: 1.4 mg/dL — AB (ref 1.7–2.4)

## 2014-09-16 MED ORDER — FUROSEMIDE 10 MG/ML IJ SOLN
12.0000 mg/h | INTRAMUSCULAR | Status: DC
  Administered 2014-09-16: 10 mg/h via INTRAVENOUS
  Administered 2014-09-17 – 2014-09-20 (×3): 12 mg/h via INTRAVENOUS
  Filled 2014-09-16 (×10): qty 25

## 2014-09-16 MED ORDER — POTASSIUM CHLORIDE CRYS ER 20 MEQ PO TBCR
40.0000 meq | EXTENDED_RELEASE_TABLET | Freq: Every day | ORAL | Status: DC
  Administered 2014-09-16: 40 meq via ORAL
  Filled 2014-09-16: qty 2

## 2014-09-16 MED ORDER — ACETAZOLAMIDE 250 MG PO TABS
250.0000 mg | ORAL_TABLET | Freq: Two times a day (BID) | ORAL | Status: DC
  Administered 2014-09-16 – 2014-09-20 (×9): 250 mg via ORAL
  Filled 2014-09-16 (×13): qty 1

## 2014-09-16 MED ORDER — POTASSIUM CHLORIDE CRYS ER 20 MEQ PO TBCR
40.0000 meq | EXTENDED_RELEASE_TABLET | Freq: Three times a day (TID) | ORAL | Status: AC
  Administered 2014-09-16 (×2): 40 meq via ORAL
  Filled 2014-09-16 (×2): qty 2

## 2014-09-16 NOTE — Progress Notes (Signed)
After receiving results of ABG, MD placed orders to place pt on continuous BiPap, and transfer pt to [facility] Room 18.  Attempted to call sister, and message stated that her phone was not accepting calls at this time.  Was able to get in touch with father of pt to inform him of pt's change in condition and room change.

## 2014-09-16 NOTE — Progress Notes (Signed)
[facility] PROGRESS NOTE  [name] [id number] DOB: 1985-08-08 DOA: 09/13/2014 PCP: Philis Fendt, MD  Brief narrative 29 year old morbidly obese male with history of diastolic CHF, OSA on CPAP, hypertension, chronic respiratory failure on 3 L Nasal cannula at home, presented to the ED with worsening shortness of breath and anasarca in the setting of acute on chronic diastolic CHF. Patient was hypoxic when EMS arrived at home satting at 88% on his home O2 and oxygen was increased to 4 L (O2 sat improved to 92%). Chest X it done in the ED showed vascular congestion with cardiomegaly.  Assessment/Plan: Acute on chronic respiratory failure secondary to acute on chronic diastolic CHF (NYHA class III) -Showed good diuresis after Lasix dose increased to 80 mg 3 times a day. ( almost 2.5 L output past 24 hrs). Still is 40 pounds above his baseline weight. -co2 level increasing and >50 in am lab. Heart failure team consulted for further management. Monitor strict I/O and daily weight. -Continue aspirin, Coreg and lisinopril. Replenished low k.    OSA Continue nighttime CPAP  Hypokalemia Replenished  Acute conjunctivitis Improving. Continue Cipro eyedrops  Diet: Heart healthy Code Status: Full code Family Communication: None at bedside Disposition Plan: Continue telemetry monitoring   Consultants:  None  Procedures:  None  Antibiotics:  None  HPI/Subjective: Patient seen and examined. Refused CPAP last night stating he was not comfortable using the mask. Reports breathing to be better.  Objective: Filed Vitals:   09/16/14 1001  BP: 122/69  Pulse: 98  Temp:   Resp:     Intake/Output Summary (Last 24 hours) at 09/16/14 1112 Last data filed at 09/16/14 1001  Gross per 24 hour  Intake   2203 ml  Output   2525 ml  Net   -322 ml   Filed Weights   09/14/14 0548 09/15/14 0649 09/16/14 0553  Weight: 140.252 kg (309 lb 3.2 oz) 140.203 kg (309 lb  1.5 oz) 139.753 kg (308 lb 1.6 oz)    Exam:   General: no acute distress  HEENT:  moist oral mucosa,  Cardiovascular: S1 and S2, no murmurs rub or gallop  Respiratory: Diminished bibasilar breath sounds  Abdomen: pitting abdominal wall edema,  nontender, nondistended  Musculoskeletal: 2+ pitting edema upto the thighs  Data Reviewed: Basic Metabolic Panel:  Recent Labs Lab 09/13/14 1856 09/14/14 0414 09/15/14 0340 09/16/14 0353  NA 136 137 134* 137  K 5.1 3.4* 4.2 3.3*  CL 79* 79* 80* 78*  CO2 48* 48* 44* >50*  GLUCOSE 85 95 95 90  BUN 9 9 12 12   CREATININE 0.98 1.02 1.10 1.18  CALCIUM 8.2* 8.4* 8.1* 8.7*   Liver Function Tests: No results for input(s): AST, ALT, ALKPHOS, BILITOT, PROT, ALBUMIN in the last 168 hours. No results for input(s): LIPASE, AMYLASE in the last 168 hours. No results for input(s): AMMONIA in the last 168 hours. CBC:  Recent Labs Lab 09/13/14 1856 09/15/14 0340  WBC 6.0 6.1  NEUTROABS 3.5  --   HGB 14.5 14.8  HCT 54.6* 56.9*  MCV 94.3 96.3  PLT 195 245   Cardiac Enzymes: No results for input(s): CKTOTAL, CKMB, CKMBINDEX, TROPONINI in the last 168 hours. BNP (last 3 results)  Recent Labs  03/21/14 0400 06/09/14 1133 09/13/14 1856  BNP 163.7* 347.5* 364.6*    ProBNP (last 3 results)  Recent Labs  10/31/13 1200  PROBNP 2527.0*    CBG: No results for input(s): GLUCAP in the last 168 hours.  No results found for this or any previous visit (from the past 240 hour(s)).   Studies: No results found.  Scheduled Meds: . aspirin EC  325 mg Oral Daily  . carvedilol  3.125 mg Oral BID WC  . ciprofloxacin  1 drop Both Eyes Q2H while awake  . enoxaparin (LOVENOX) injection  40 mg Subcutaneous Q24H  . furosemide  80 mg Intravenous 3 times per day  . lisinopril  2.5 mg Oral Daily  . potassium chloride  40 mEq Oral Daily  . sodium chloride  3 mL Intravenous Q12H   Continuous Infusions:     Time spent: [place] [place] [facility] Pager [phone] If 7PM-7AM, please contact night-coverage at [email or web address], password [facility] 09/16/2014, 11:12 AM  LOS: 3 days

## 2014-09-16 NOTE — Consult Note (Signed)
Advanced Heart Failure Team Consult Note  Referring Physician: Dhungel Primary Physician: Avbuere Primary Cardiologist:  Stanford Breed  Reason for Consultation: Diastolic CHF, Increase SOB and Edema  HPI:    Douglas [name]. is a 29 y.o. male with a hx of Diastolic CHF EF 80-99%, pulmonary HTN with cor pulmonale, OSA, obesity, hx of substance abuse, HTN, CKD, and non-adherence to medical Rx.  He was last seen by his general cardiologist 06/09/14. It was noted that his weight was up to 287 from 270 in March and he had worsening hypoxia with 83% on 4 lpm of 02.  He was admitted the hospital for acute hypoxia.  His discharge weight was 283.  Follow ups with pulmonary showed him not using his chronic 02, resulting in 02 sats of 65% and 68% on room air, corrected to low 90s with 4 lpm.   He spoke with Dr Lonia Skinner office on 09/10/14 c/o fluid in legs and ankle and worsening dyspnea.  He was told to increase his Lasix from 40 mg BID to 80 mg AM and 40 mg PM for 2 days and to follow up via phone on Friday. He continued to have problems and called [facility] to transport him to the hospital on 09/13/14.  Pertinent admit vitals and labs include weight of 310, BNP 364.6, Cr 0.98, K 5.1, and CXR with vascular congestion and mildly increase interstitial markings consistent with interstitial edema.  He was referred to the HF team by Dr [name] for assistance with HF management.  He is a difficult historian and gives only terse one word answers unless prompted multiple times. He states his fluid has been increasing over the past few weeks.  He states he has been taking all of his medications as directed, despite telling the Admitting MD he had been off of his lasix x 1 week and only recently restarted.  He says he is not currently SOB or having CP.  He denies lightheadedness or dizziness. He states he walked the halls earlier without difficulty.  He refused CPAP last night because the mask if different from his at  home. He denies any substance abuse, and states the last time he used cocaine was last year. He is on chronic 02 at 4 lpm.  Unimpressive output so far on 80 mg IV lasix TID. Pt also has ++ oral fluid intake.  Weight stable over past several days only down 1 lb. Overall deficit only 0.744 ml this admission.  Echo 03/20/14 EF 60-65%, RV moderately dilated with moderately reduced systolic function. D-shaped septum. "right sided pressures have increased"  No PFTs found. No Cath found.  Review of Systems: [y] = yes, [ ]  = no   General: Weight gain [y]; Weight loss [ ] ; Anorexia [ ] ; Fatigue [y]; Fever [ ] ; Chills [ ] ; Weakness [ ]   Cardiac: Chest pain/pressure [ ] ; Resting SOB [ ] ; Exertional SOB [y]; Orthopnea [y]; Pedal Edema [y]; Palpitations [ ] ; Syncope [ ] ; Presyncope [ ] ; Paroxysmal nocturnal dyspnea[ ]   Pulmonary: Cough [ ] ; Wheezing[ ] ; Hemoptysis[ ] ; Sputum [ ] ; Snoring [ ]   GI: Vomiting[ ] ; Dysphagia[ ] ; Melena[ ] ; Hematochezia [ ] ; Heartburn[ ] ; Abdominal pain [ ] ; Constipation [ ] ; Diarrhea [ ] ; BRBPR [ ]   GU: Hematuria[ ] ; Dysuria [ ] ; Nocturia[ ]   Vascular: Pain in legs with walking [ ] ; Pain in feet with lying flat [ ] ; Non-healing sores [ ] ; Stroke [ ] ; TIA [ ] ; Slurred speech [ ] ;  Neuro: Headaches[ ] ;  Vertigo[ ] ; Seizures[ ] ; Paresthesias[ ] ;Blurred vision [ ] ; Diplopia [ ] ; Vision changes [ ]   Ortho/Skin: Arthritis [ ] ; Joint pain [ ] ; Muscle pain [ ] ; Joint swelling [ ] ; Back Pain [ ] ; Rash [ ]   Psych: Depression[ ] ; Anxiety[ ]   Heme: Bleeding problems [ ] ; Clotting disorders [ ] ; Anemia [ ]   Endocrine: Diabetes [ ] ; Thyroid dysfunction[ ]   Home Medications Prior to Admission medications   Medication Sig Start Date End Date Taking? Authorizing Provider  albuterol (PROVENTIL HFA;VENTOLIN HFA) 108 (90 BASE) MCG/ACT inhaler Inhale 1-2 puffs into the lungs every 6 (six) hours as needed for wheezing or shortness of breath. 11/05/13  Yes Shanker Kristeen Mans, MD  amLODipine (NORVASC)  5 MG tablet Take 1 tablet (5 mg total) by mouth daily. 06/12/14  Yes Maryann Mikhail, DO  furosemide (LASIX) 40 MG tablet Take 1 tablet (40 mg total) by mouth 2 (two) times daily. 01/27/14  Yes Evelina Bucy, MD  aspirin EC 325 MG EC tablet Take 1 tablet (325 mg total) by mouth daily. 11/05/13   Shanker Kristeen Mans, MD    Past Medical History: Past Medical History  Diagnosis Date  . Hypertension   . Obesity   . Tobacco abuse   . Substance abuse 8/31/213    cociane "first time use"  . High cholesterol   . CHF (congestive heart failure)   . Chronic bronchitis   . OSA on CPAP   . Arthritis     "hands, feet" (10/31/2013)  . Pneumonia 09/2011    Past Surgical History: Past Surgical History  Procedure Laterality Date  . No past surgeries    . Tubes in ears      Family History: Family History  Problem Relation Age of Onset  . Heart disease Mother     Pacemaker  . Heart attack Neg Hx   . Stroke Neg Hx   . Hypertension Father   . Cancer Paternal Grandfather     Social History: Social History   Social History  . Marital Status: Single    Spouse Name: N/A  . Number of Children: 2  . Years of Education: N/A   Occupational History  . N/A    Social History Main Topics  . Smoking status: Former Smoker -- 0.50 packs/day for 9 years    Types: Cigarettes    Quit date: 10/31/2013  . Smokeless tobacco: Never Used  . Alcohol Use: No  . Drug Use: Yes    Special: Cocaine     Comment: 10/31/2013 "might use cocaine once/month" - Has not used since 2015.   Marland Kitchen Sexual Activity: No   Other Topics Concern  . None   Social History Narrative    Allergies:  No Known Allergies  Objective:    Vital Signs:   Temp:  [97.7 F (36.5 C)-98 F (36.7 C)] 97.8 F (36.6 C) (08/23 0553) Pulse Rate:  [88-98] 98 (08/23 1001) Resp:  [18] 18 (08/23 0553) BP: (101-126)/(55-82) 122/69 mmHg (08/23 1001) SpO2:  [94 %-96 %] 95 % (08/23 0553) Weight:  [308 lb 1.6 oz (139.753 kg)] 308 lb 1.6 oz  (139.753 kg) (08/23 0553) Last BM Date: 09/15/14  Weight change: Filed Weights   09/14/14 0548 09/15/14 0649 09/16/14 0553  Weight: 309 lb 3.2 oz (140.252 kg) 309 lb 1.5 oz (140.203 kg) 308 lb 1.6 oz (139.753 kg)    Intake/Output:   Intake/Output Summary (Last 24 hours) at 09/16/14 1033 Last data filed at 09/16/14 1001  Gross  per 24 hour  Intake   2203 ml  Output   2525 ml  Net   -322 ml     Physical Exam: General:  Snoring loudly on bed with apparent apneic episodes, awakens easily. NAD HEENT: [place] in place Neck: supple. JVP to ear . Carotids 2+ bilat; no bruits. No lymphadenopathy or thryomegaly appreciated. Cor: PMI nondisplaced. Regular rate & rhythm. No rubs, gallops or murmurs. Lungs: Diminished with crackles to mid lung. Abdomen: Obese, nontender,  ++distended and mildly tight. No HSM appreciated. No bruits or masses. +BS Extremities: no cyanosis, clubbing, rash. 2-3 tight edema to thighs.  Neuro: alert & orientedx3, cranial nerves grossly intact. moves all 4 extremities w/o difficulty. Affect flat  Telemetry: NSR to tach- 90-100s  Labs: Basic Metabolic Panel:  Recent Labs Lab 09/13/14 1856 09/14/14 0414 09/15/14 0340 09/16/14 0353  NA 136 137 134* 137  K 5.1 3.4* 4.2 3.3*  CL 79* 79* 80* 78*  CO2 48* 48* 44* >50*  GLUCOSE 85 95 95 90  BUN 9 9 12 12   CREATININE 0.98 1.02 1.10 1.18  CALCIUM 8.2* 8.4* 8.1* 8.7*    Liver Function Tests: No results for input(s): AST, ALT, ALKPHOS, BILITOT, PROT, ALBUMIN in the last 168 hours. No results for input(s): LIPASE, AMYLASE in the last 168 hours. No results for input(s): AMMONIA in the last 168 hours.  CBC:  Recent Labs Lab 09/13/14 1856 09/15/14 0340  WBC 6.0 6.1  NEUTROABS 3.5  --   HGB 14.5 14.8  HCT 54.6* 56.9*  MCV 94.3 96.3  PLT 195 245    Cardiac Enzymes: No results for input(s): CKTOTAL, CKMB, CKMBINDEX, TROPONINI in the last 168 hours.  BNP: BNP (last 3 results)  Recent Labs   03/21/14 0400 06/09/14 1133 09/13/14 1856  BNP 163.7* 347.5* 364.6*    ProBNP (last 3 results)  Recent Labs  10/31/13 1200  PROBNP 2527.0*     CBG: No results for input(s): GLUCAP in the last 168 hours.  Coagulation Studies: No results for input(s): LABPROT, INR in the last 72 hours.  Other results: EKG: Sinus tach 99, RAD, borderline prolonged QT (QTc 475)  09/15/14  Imaging:  No results found.   Medications:     Current Medications: . aspirin EC  325 mg Oral Daily  . carvedilol  3.125 mg Oral BID WC  . ciprofloxacin  1 drop Both Eyes Q2H while awake  . enoxaparin (LOVENOX) injection  40 mg Subcutaneous Q24H  . furosemide  80 mg Intravenous 3 times per day  . lisinopril  2.5 mg Oral Daily  . potassium chloride  40 mEq Oral Daily  . sodium chloride  3 mL Intravenous Q12H     Infusions:      Assessment/Plan   1. Acute on chronic diastolic CHF - Likely in the setting of medical, fluid, and dietary non-compliance - Is significantly volume overloaded on exam. - Switch from TID lasix to continuous infusion starting at 12 mg/hr - Continue current meds otherwise. - Increase K supp. Check Mg. - Educated on importance of fluid restriction.  1800L limit added to diet order 2. Pulmonary HTN/ Cor Pulmonale -Sees Dr. Halford Chessman - No PFTs noted - Chronic 02 at home - CPAP at night.  Refused due to different mask.  Encouraged to try tonight and stressed importance. 3. CKD stage 2 - Follow closely with diuresis 4. Hypokalemia - Will increase K supp with lasix gtt 5. HTN - Stable currently 6. Morbid obesity 7. Substance abuse -  No cocaine since 2015 - Former smoker, No ETOH. 8. Medical non compliance  Dispo: Will need several days of aggressive diuresis.  End of week would likely be earliest d/c time if responds well to diuresis. PT to assess.  Length of Stay: 3  Shirley Friar PA-C 09/16/2014, 10:33 AM  Advanced Heart Failure Team Pager [phone]  (M-F; 7a - 4p)  Please contact [facility] for night-coverage after hours (4p -7a ) and weekends on [email or web address]  Patient seen with PA, agree with the above note.  Severe OHS/OSA.  He is very drowsy/lethargic currently but awakens. HCO3 > 50, suspect PaCO2 is very high.   - ABG now => suspect he will need Bipap.   Cor pulmonale from OHS/OSA.  He is very volume overloaded with JVD, has not diuresed well.  Agree with Lasix gtt at 12 mg/hr and would add acetazolamide 250 mg bid.   Suspect, as above, that he needs to be on Bipap and moved to step-down.   [name] 09/16/2014 1:03 PM

## 2014-09-17 DIAGNOSIS — J9622 Acute and chronic respiratory failure with hypercapnia: Secondary | ICD-10-CM

## 2014-09-17 DIAGNOSIS — G4733 Obstructive sleep apnea (adult) (pediatric): Secondary | ICD-10-CM

## 2014-09-17 LAB — BASIC METABOLIC PANEL
Anion gap: 13 (ref 5–15)
BUN: 12 mg/dL (ref 6–20)
CHLORIDE: 81 mmol/L — AB (ref 101–111)
CO2: 42 mmol/L — AB (ref 22–32)
CREATININE: 1.07 mg/dL (ref 0.61–1.24)
Calcium: 8.9 mg/dL (ref 8.9–10.3)
GFR calc Af Amer: >60 mL/min (ref >60)
GFR calc non Af Amer: >60 mL/min (ref >60)
GLUCOSE: 80 mg/dL (ref 65–99)
POTASSIUM: 4.9 mmol/L (ref 3.5–5.1)
Sodium: 136 mmol/L (ref 135–145)

## 2014-09-17 LAB — RAPID URINE DRUG SCREEN, HOSP PERFORMED
AMPHETAMINES: NOT DETECTED
BARBITURATES: NOT DETECTED
BENZODIAZEPINES: NOT DETECTED
COCAINE: NOT DETECTED
Opiates: NOT DETECTED
Tetrahydrocannabinol: NOT DETECTED

## 2014-09-17 LAB — POCT I-STAT 3, ART BLOOD GAS (G3+)
Acid-Base Excess: 18 mmol/L — ABNORMAL HIGH (ref 0.0–2.0)
BICARBONATE: 51 meq/L — AB (ref 20.0–24.0)
O2 Saturation: 94 %
PCO2 ART: 89 mmHg — AB (ref 35.0–45.0)
PO2 ART: 79 mmHg — AB (ref 80.0–100.0)
TCO2: >50 mmol/L (ref 0–100)
pH, Arterial: 7.366 (ref 7.350–7.450)

## 2014-09-17 LAB — MAGNESIUM: MAGNESIUM: 1.5 mg/dL — AB (ref 1.7–2.4)

## 2014-09-17 MED ORDER — MAGNESIUM SULFATE 4 GM/100ML IV SOLN
4.0000 g | Freq: Once | INTRAVENOUS | Status: AC
  Administered 2014-09-17: 4 g via INTRAVENOUS
  Filled 2014-09-17: qty 100

## 2014-09-17 MED ORDER — METOLAZONE 2.5 MG PO TABS: 2.5000 mg | ORAL_TABLET | Freq: Once | ORAL | Status: DC

## 2014-09-17 MED ORDER — POTASSIUM CHLORIDE CRYS ER 20 MEQ PO TBCR
20.0000 meq | EXTENDED_RELEASE_TABLET | Freq: Three times a day (TID) | ORAL | Status: DC
  Administered 2014-09-17 (×3): 20 meq via ORAL
  Filled 2014-09-17 (×3): qty 1

## 2014-09-17 NOTE — Progress Notes (Signed)
[facility] PROGRESS NOTE  Douglas [name]. RJJ:[id number] DOB: 30-Mar-1985 DOA: 09/13/2014 PCP: Philis Fendt, MD  Brief narrative 29 year old morbidly obese male with history of diastolic CHF, OSA on CPAP, hypertension, chronic respiratory failure on 3 L Nasal cannula at home, presented to the ED with worsening shortness of breath and anasarca in the setting of acute on chronic diastolic CHF. Patient was hypoxic when EMS arrived at home satting at 88% on his home O2 and oxygen was increased to 4 L (O2 sat improved to 92%). Chest X ray done in the ED showed vascular congestion with cardiomegaly. Hospital course prolonged due to anasarca and hypercarbic respiratory failure. Pt became lethargic with hypercarbia on 8/23 . Heart failure team consulted, who placed him on lasix drip and acetazolamide and transferred pt to step down on BiPAP.   Assessment/Plan: Acute on chronic respiratory failure secondary to acute on chronic diastolic CHF (NYHA class III) with hypercarbia and AMS Now on lasix drip + acetazolamide. Diuresing well. Has lost 6-7 lbs already. contineu current regimen. Patient alert and oriented today . repat BMET nd ABG pending. Will place back on BiPAP if still hypercapnic. -appreciate heart failure team follow up. Monitor strict I/O and daily weight. -Continue aspirin, Coreg and lisinopril. Replenish low k.    OSA/OHS Continue nighttime BiPAP. Sees pulmonary as outpt,. Reports being compliant. counseled improvtance of conitnuous o2 use and nightly BiPAP at home.   Hypokalemia Replenished  Acute conjunctivitis Resolved. . Continue Cipro eyedrops  Diet: Heart healthy Code Status: Full code Family Communication: None at bedside Disposition Plan: Continue stepdowen monitoring   Consultants:  Heart failure  Procedures:  None  Antibiotics:  None  HPI/Subjective: Patient seen and examined. Placed on BiPAP and transferred to stepdown for hypercapnic resp  failure and lethargy. Started on lasix drip. Was on BiPAP until this am. Diuresed well.  Objective: Filed Vitals:   09/17/14 0811  BP: 95/62  Pulse: 81  Temp: 97.9 F (36.6 C)  Resp: 15    Intake/Output Summary (Last 24 hours) at 09/17/14 0833 Last data filed at 09/17/14 0800  Gross per 24 hour  Intake 1002.87 ml  Output   2563 ml  Net -1560.13 ml   Filed Weights   09/15/14 0649 09/16/14 0553 09/17/14 0600  Weight: 140.203 kg (309 lb 1.5 oz) 139.753 kg (308 lb 1.6 oz) 136 kg (299 lb 13.2 oz)    Exam:   General: no acute distress  HEENT:  moist oral mucosa, JVD+  Cardiovascular: S1 and S2, no murmurs rub or gallop  Respiratory: Diminished bibasilar breath sounds  Abdomen: pitting abdominal wall edema ( Improved)   nontender, nondistended  Musculoskeletal: 2+ pitting edema upto the thighs  Data Reviewed: Basic Metabolic Panel:  Recent Labs Lab 09/13/14 1856 09/14/14 0414 09/15/14 0340 09/16/14 0353  NA 136 137 134* 137  K 5.1 3.4* 4.2 3.3*  CL 79* 79* 80* 78*  CO2 48* 48* 44* >50*  GLUCOSE 85 95 95 90  BUN 9 9 12 12   CREATININE 0.98 1.02 1.10 1.18  CALCIUM 8.2* 8.4* 8.1* 8.7*  MG  --   --   --  1.4*   Liver Function Tests: No results for input(s): AST, ALT, ALKPHOS, BILITOT, PROT, ALBUMIN in the last 168 hours. No results for input(s): LIPASE, AMYLASE in the last 168 hours. No results for input(s): AMMONIA in the last 168 hours. CBC:  Recent Labs Lab 09/13/14 1856 09/15/14 0340  WBC 6.0 6.1  NEUTROABS 3.5  --  HGB 14.5 14.8  HCT 54.6* 56.9*  MCV 94.3 96.3  PLT 195 245   Cardiac Enzymes: No results for input(s): CKTOTAL, CKMB, CKMBINDEX, TROPONINI in the last 168 hours. BNP (last 3 results)  Recent Labs  03/21/14 0400 06/09/14 1133 09/13/14 1856  BNP 163.7* 347.5* 364.6*    ProBNP (last 3 results)  Recent Labs  10/31/13 1200  PROBNP 2527.0*    CBG: No results for input(s): GLUCAP in the last 168 hours.  Recent Results  (from the past 240 hour(s))  MRSA PCR Screening     Status: None   Collection Time: 09/16/14  4:22 PM  Result Value Ref Range Status   MRSA by PCR NEGATIVE NEGATIVE Final    Comment:        The GeneXpert MRSA Assay (FDA approved for NASAL specimens only), is one component of a comprehensive MRSA colonization surveillance program. It is not intended to diagnose MRSA infection nor to guide or monitor treatment for MRSA infections.      Studies: No results found.  Scheduled Meds: . acetaZOLAMIDE  250 mg Oral BID  . aspirin EC  325 mg Oral Daily  . carvedilol  3.125 mg Oral BID WC  . ciprofloxacin  1 drop Both Eyes Q2H while awake  . enoxaparin (LOVENOX) injection  40 mg Subcutaneous Q24H  . lisinopril  2.5 mg Oral Daily  . sodium chloride  3 mL Intravenous Q12H   Continuous Infusions: . furosemide (LASIX) infusion 12 mg/hr (09/17/14 0800)      Time spent: [place], [place]  [facility] Pager [phone] If 7PM-7AM, please contact night-coverage at [email or web address], password [facility] 09/17/2014, 8:33 AM  LOS: 4 days

## 2014-09-17 NOTE — Progress Notes (Signed)
ABG done, CO2 down from previous gas with normal pH. Pt eating breakfast at this time therefore unable to go on Bipap now.

## 2014-09-17 NOTE — Progress Notes (Signed)
Attempted ABG. Pt refuses at this time.

## 2014-09-17 NOTE — Consult Note (Signed)
PULMONARY / CRITICAL CARE MEDICINE   Name: Douglas [name]. MRN: [id number] DOB: 02-Aug-1985    ADMISSION DATE:  09/13/2014 CONSULTATION DATE: 8/24  REFERRING MD :  Douglas [name]  CHIEF COMPLAINT: SOB  INITIAL PRESENTATION: SOB  STUDIES:    SIGNIFICANT EVENTS:    HISTORY OF PRESENT ILLNESS:  29 yo AAM with known OSA/cor pulmonale O2 and bipap dependent who ran out of lasix one week prior to admit . He has an EF 60-65%. He reports increased lower ext edema and was 310 lbs on admit, currently down 2 lbs with negative 3 litres since admit.  His weight was 290 in June 2016. He is on  bipap  dependent  With settings 21/17 with 3 l O2 bleed in. He is currently hypercarbic with normal PH but he should be on bipap any time he sleeps. He is working towards his baseline which is O2/Bipap dependent. He denies fever, chills , sweats, purulent sputum. WBC is 6.1, temp 97.1. PCCM asked to evaluate.   PAST MEDICAL HISTORY :   has a past medical history of Hypertension; Obesity; Tobacco abuse; Substance abuse (8/31/213); High cholesterol; CHF (congestive heart failure); Chronic bronchitis; OSA on CPAP; Arthritis; and Pneumonia (09/2011).  has past surgical history that includes No past surgeries and TUBES IN EARS. Prior to Admission medications   Medication Sig Start Date End Date Taking? Authorizing Provider  albuterol (PROVENTIL HFA;VENTOLIN HFA) 108 (90 BASE) MCG/ACT inhaler Inhale 1-2 puffs into the lungs every 6 (six) hours as needed for wheezing or shortness of breath. 11/05/13  Yes Douglas Kristeen Mans, MD  amLODipine (NORVASC) 5 MG tablet Take 1 tablet (5 mg total) by mouth daily. 06/12/14  Yes Douglas Mikhail, DO  furosemide (LASIX) 40 MG tablet Take 1 tablet (40 mg total) by mouth 2 (two) times daily. 01/27/14  Yes Douglas Bucy, MD  aspirin EC 325 MG EC tablet Take 1 tablet (325 mg total) by mouth daily. 11/05/13   Douglas Kristeen Mans, MD   No Known Allergies  FAMILY HISTORY:  indicated that his  mother is alive. He indicated that his father is alive.  SOCIAL HISTORY:  reports that he quit smoking about 10 months ago. His smoking use included Cigarettes. He has a 4.5 pack-year smoking history. He has never used smokeless tobacco. He reports that he uses illicit drugs (Cocaine). He reports that he does not drink alcohol.  REVIEW OF SYSTEMS:   10 point review of system taken, please see HPI for positives and negatives.   SUBJECTIVE:   VITAL SIGNS: Temp:  [97.4 F (36.3 C)-98.7 F (37.1 C)] 97.9 F (36.6 C) (08/24 0811) Pulse Rate:  [69-91] 81 (08/24 0811) Resp:  [11-18] 15 (08/24 0811) BP: (95-140)/(62-94) 95/62 mmHg (08/24 0811) SpO2:  [73 %-100 %] 100 % (08/24 0811) FiO2 (%):  [40 %] 40 % (08/24 0400) Weight:  [299 lb 13.2 oz (136 kg)] 299 lb 13.2 oz (136 kg) (08/24 0600) HEMODYNAMICS:   VENTILATOR SETTINGS: Vent Mode:  [-]  FiO2 (%):  [40 %] 40 % INTAKE / OUTPUT:  Intake/Output Summary (Last 24 hours) at 09/17/14 1206 Last data filed at 09/17/14 1129  Gross per 24 hour  Intake 587.87 ml  Output   3363 ml  Net -2775.13 ml    PHYSICAL EXAMINATION: General:  MOAAF awake and interactive Neuro:  Intact HEENT: Poor dentition  Cardiovascular: HSR RRR  Lungs:  Decreased air movement Abdomen:  Obsese, firm, + bs Musculoskeletal: intact Skin:  +++ le edema  LABS:  CBC  Recent Labs Lab 09/13/14 1856 09/15/14 0340  WBC 6.0 6.1  HGB 14.5 14.8  HCT 54.6* 56.9*  PLT 195 245   Coag's No results for input(s): APTT, INR in the last 168 hours. BMET  Recent Labs Lab 09/15/14 0340 09/16/14 0353 09/17/14 0750  NA 134* 137 136  K 4.2 3.3* 4.9  CL 80* 78* 81*  CO2 44* >50* 42*  BUN 12 12 12   CREATININE 1.10 1.18 1.07  GLUCOSE 95 90 80   Electrolytes  Recent Labs Lab 09/15/14 0340 09/16/14 0353 09/17/14 0750  CALCIUM 8.1* 8.7* 8.9  MG  --  1.4* 1.5*   Sepsis Markers No results for input(s): LATICACIDVEN, PROCALCITON, O2SATVEN in the last 168  hours. ABG  Recent Labs Lab 09/16/14 1345 09/17/14 0846  PHART 7.335* 7.366  PCO2ART 100* 89.0*  PO2ART 64.6* 79.0*   Liver Enzymes No results for input(s): AST, ALT, ALKPHOS, BILITOT, ALBUMIN in the last 168 hours. Cardiac Enzymes No results for input(s): TROPONINI, PROBNP in the last 168 hours. Glucose No results for input(s): GLUCAP in the last 168 hours.  Imaging No results found.   ASSESSMENT / PLAN:  PULMONARY OETT A: OSA/OHS  Cor pulmonale COPD PAH Hypercarbia with metabolic compensation  P:   Bipap nocturnal and when every asleep, 21/17 home settings O2 24/7 No narcotics or sedatives Aggressive diuresis as tolerated  CARDIOVASCULAR CVL A:  CHF, PAH P:  Diuresis as tolerated Establish dry weight   RENAL A: No acute issue P:     GASTROINTESTINAL A:  MO P:   Wt loss  HEMATOLOGIC A:   No acute issue P:    INFECTIOUS A:   No acute issue P:     ENDOCRINE A:   No acute issue P:     NEUROLOGIC A:  Intact despite high Pco2 P:   RASS goal: 1 No sedation Bipap when asleep Check UDS   FAMILY  - Updates: Pt up dated  - Inter-disciplinary family meet or Palliative Care meeting due by:  day 7   Intake/Output Summary (Last 24 hours) at 09/17/14 1212 Last data filed at 09/17/14 1129  Gross per 24 hour  Intake 587.87 ml  Output   3363 ml  Net -2775.13 ml     TODAY'S SUMMARY:  29 yo AAM with known OSA/cor pulmonale O2 and bipap dependent who ran out of lasix one week prior to admit . He has an EF 60-65%. He reports increased lower ext edema and was 310 lbs on admit, currently down 2 lbs with negative 3 litres since admit.  His weight was 290 in June 2016. He is on  bipap  dependent  With settings 21/17 with 3 l O2 bleed in. He is currently hypercarbic with normal PH but he should be on bipap any time he sleeps. He is working towards his baseline which is O2/Bipap dependent. He denies fever, chills , sweats, purulent sputum.  WBC is 6.1, temp 97.1. PCCM asked to evaluate.   Douglas [name] ACNP Douglas [name] PCCM Pager [phone] till 3 pm If no answer page [phone] 09/17/2014, 12:18 PM

## 2014-09-17 NOTE — Progress Notes (Signed)
Patient ID: [name], male   DOB: [birth year], 29 y.o.   MRN: [id number]   SUBJECTIVE: Patient sent to step down yesterday with marked hypercarbia, started on Bipap. Back on nasal cannula this morning.  Much more alert.  Good diuresis yesterday and weight down considerably.  I/Os not accurate due to incontinence.   Scheduled Meds: . acetaZOLAMIDE  250 mg Oral BID  . aspirin EC  325 mg Oral Daily  . carvedilol  3.125 mg Oral BID WC  . ciprofloxacin  1 drop Both Eyes Q2H while awake  . enoxaparin (LOVENOX) injection  40 mg Subcutaneous Q24H  . lisinopril  2.5 mg Oral Daily  . sodium chloride  3 mL Intravenous Q12H   Continuous Infusions: . furosemide (LASIX) infusion 12 mg/hr (09/17/14 0720)   PRN Meds:.sodium chloride, acetaminophen, albuterol, ondansetron (ZOFRAN) IV, sodium chloride     Filed Vitals:   09/17/14 0400 09/17/14 0500 09/17/14 0600 09/17/14 0609  BP: 105/73   125/90  Pulse: 73 69  85  Temp:      TempSrc:      Resp: 14 12    Height:      Weight:   299 lb 13.2 oz (136 kg)   SpO2: 97% 100%      Intake/Output Summary (Last 24 hours) at 09/17/14 0752 Last data filed at 09/17/14 0500  Gross per 24 hour  Intake 958.87 ml  Output   2163 ml  Net -1204.13 ml    LABS: Basic Metabolic Panel:  Recent Labs  09/15/14 0340 09/16/14 0353  NA 134* 137  K 4.2 3.3*  CL 80* 78*  CO2 44* >50*  GLUCOSE 95 90  BUN 12 12  CREATININE 1.10 1.18  CALCIUM 8.1* 8.7*  MG  --  1.4*   Liver Function Tests: No results for input(s): AST, ALT, ALKPHOS, BILITOT, PROT, ALBUMIN in the last 72 hours. No results for input(s): LIPASE, AMYLASE in the last 72 hours. CBC:  Recent Labs  09/15/14 0340  WBC 6.1  HGB 14.8  HCT 56.9*  MCV 96.3  PLT 245   Cardiac Enzymes: No results for input(s): CKTOTAL, CKMB, CKMBINDEX, TROPONINI in the last 72 hours. BNP: Invalid input(s): POCBNP D-Dimer: No results for input(s): DDIMER in the last 72 hours. Hemoglobin A1C: No  results for input(s): HGBA1C in the last 72 hours. Fasting Lipid Panel: No results for input(s): CHOL, HDL, LDLCALC, TRIG, CHOLHDL, LDLDIRECT in the last 72 hours. Thyroid Function Tests: No results for input(s): TSH, T4TOTAL, T3FREE, THYROIDAB in the last 72 hours.  Invalid input(s): FREET3 Anemia Panel: No results for input(s): VITAMINB12, FOLATE, FERRITIN, TIBC, IRON, RETICCTPCT in the last 72 hours.  RADIOLOGY: Dg Chest 2 View  09/13/2014   CLINICAL DATA:  Acute onset of shortness of breath and lower extremity and abdominal soft tissue swelling. Initial encounter.  EXAM: CHEST  2 VIEW  COMPARISON:  Chest radiograph performed 06/11/2014  FINDINGS: The lungs are well-aerated. Vascular congestion is noted, with mildly increased interstitial markings, raising question for mild interstitial edema. There is no evidence of pleural effusion or pneumothorax.  The heart is enlarged.  No acute osseous abnormalities are seen.  IMPRESSION: Vascular congestion and cardiomegaly, with mildly increased interstitial markings, raising question for mild interstitial edema.   Electronically Signed   By: Garald Balding M.D.   On: 09/13/2014 20:03    PHYSICAL EXAM General: NAD Neck: JVP 16 cm, no thyromegaly or thyroid nodule.  Lungs: Decreased breath sounds bilaterally.  CV:  Nondisplaced PMI.  Heart regular S1/S2, no S3/S4, no murmur.  2+ edema to knees.  No carotid bruit.  Normal pedal pulses.  Abdomen: Soft, nontender, no hepatosplenomegaly, no distention.  Neurologic: Alert and oriented x 3.  Psych: Normal affect. Extremities: No clubbing or cyanosis.   TELEMETRY: Reviewed telemetry pt in NSR  ASSESSMENT AND PLAN: 29 yo with OHS/OSA with OHS/OSA, morbid obesity, diastolic CHF with prominent RV failure was admitted with acute/chronic diastolic CHF. 1. Acute/chronic diastolic CHF with prominent RV failure: EF normal on last echo with moderate RV dilation/moderate RV systolic dysfunction.  He is markedly volume  overloaded.  Incontinent so I/Os inaccurate but weight down. Refused labs initially today.  - Will get labs now.  - Continue Lasix gtt at current rate + acetazolamide.  2. OHS/OSA: Marked hypercarbia yesterday with altered mental status => Bipap started.  On Bipap until this morning.  Much more alert.  Will repeat ABG today. Oxygen saturation in high 80s on nasal cannula.  May need to go back on Bipap, may need pulmonary appt.  3. CKD: Needs BMET today.   [name] 09/17/2014 8:02 AM

## 2014-09-18 LAB — BASIC METABOLIC PANEL
Anion gap: 6 (ref 5–15)
BUN: 12 mg/dL (ref 6–20)
CALCIUM: 8.9 mg/dL (ref 8.9–10.3)
CHLORIDE: 83 mmol/L — AB (ref 101–111)
CO2: 46 mmol/L — AB (ref 22–32)
CREATININE: 1.09 mg/dL (ref 0.61–1.24)
GFR calc non Af Amer: >60 mL/min (ref >60)
Glucose, Bld: 83 mg/dL (ref 65–99)
Potassium: 5.1 mmol/L (ref 3.5–5.1)
SODIUM: 135 mmol/L (ref 135–145)

## 2014-09-18 LAB — CBC
HCT: 57.6 % — ABNORMAL HIGH (ref 39.0–52.0)
Hemoglobin: 15.4 g/dL (ref 13.0–17.0)
MCH: 25.2 pg — AB (ref 26.0–34.0)
MCHC: 26.7 g/dL — ABNORMAL LOW (ref 30.0–36.0)
MCV: 94.1 fL (ref 78.0–100.0)
PLATELETS: 238 10*3/uL (ref 150–400)
RBC: 6.12 MIL/uL — AB (ref 4.22–5.81)
RDW: 20.4 % — AB (ref 11.5–15.5)
WBC: 5.7 10*3/uL (ref 4.0–10.5)

## 2014-09-18 LAB — MAGNESIUM: Magnesium: 1.9 mg/dL (ref 1.7–2.4)

## 2014-09-18 LAB — LIPID PANEL
CHOLESTEROL: 144 mg/dL (ref 0–200)
HDL: 35 mg/dL — ABNORMAL LOW (ref >40)
LDL CALC: 97 mg/dL (ref 0–99)
TRIGLYCERIDES: 62 mg/dL (ref <150)
Total CHOL/HDL Ratio: 4.1 RATIO
VLDL: 12 mg/dL (ref 0–40)

## 2014-09-18 MED ORDER — POTASSIUM CHLORIDE CRYS ER 20 MEQ PO TBCR
20.0000 meq | EXTENDED_RELEASE_TABLET | Freq: Two times a day (BID) | ORAL | Status: DC
  Administered 2014-09-18 – 2014-09-21 (×7): 20 meq via ORAL
  Filled 2014-09-18 (×7): qty 1

## 2014-09-18 MED ORDER — SODIUM CHLORIDE 0.9 % IJ SOLN
10.0000 mL | INTRAMUSCULAR | Status: DC | PRN
Start: 2014-09-18 — End: 2014-09-21

## 2014-09-18 MED ORDER — SODIUM CHLORIDE 0.9 % IJ SOLN
10.0000 mL | Freq: Two times a day (BID) | INTRAMUSCULAR | Status: DC
  Administered 2014-09-18 – 2014-09-20 (×3): 10 mL

## 2014-09-18 NOTE — Evaluation (Signed)
SATURATION QUALIFICATIONS: (This note is used to comply with regulatory documentation for home oxygen)  Patient Saturations on Room Air at Rest = 90%  Patient Saturations on Room Air while Ambulating = 81%  Patient Saturations on 6 Liters of oxygen while Ambulating = 95%  Patient Saturations on 4 Liters of oxygen while Ambulating = 90-93%  Please briefly explain why patient needs home oxygen: Patient requires home oxygen due to rapid desat during ambulation, as well as high level of oxygen necessary to stay >90% during ambulation.  Roanna Epley, SPT [phone] I have read, reviewed and agree with student's note.   [facility] [phone] (pager)

## 2014-09-18 NOTE — Progress Notes (Signed)
Patient ID: [name], male   DOB: [birth year], 29 y.o.   MRN: [id number]   SUBJECTIVE: Good diuresis again yesterday, weight down 10 lbs.  Difficulty getting blood draws per patient.   Scheduled Meds: . acetaZOLAMIDE  250 mg Oral BID  . carvedilol  3.125 mg Oral BID WC  . ciprofloxacin  1 drop Both Eyes Q2H while awake  . enoxaparin (LOVENOX) injection  40 mg Subcutaneous Q24H  . lisinopril  2.5 mg Oral Daily  . potassium chloride  20 mEq Oral BID  . sodium chloride  3 mL Intravenous Q12H   Continuous Infusions: . furosemide (LASIX) infusion 12 mg/hr (09/17/14 2000)   PRN Meds:.sodium chloride, acetaminophen, albuterol, ondansetron (ZOFRAN) IV, sodium chloride     Filed Vitals:   09/18/14 0400 09/18/14 0402 09/18/14 0500 09/18/14 0630  BP: 110/79 110/79  118/74  Pulse:  77  83  Temp:  98.3 F (36.8 C)    TempSrc:  Oral    Resp: 14 10    Height:      Weight:   289 lb 11 oz (131.4 kg)   SpO2:  95%      Intake/Output Summary (Last 24 hours) at 09/18/14 0734 Last data filed at 09/18/14 2841  Gross per 24 hour  Intake   2086 ml  Output   6575 ml  Net  -4489 ml    LABS: Basic Metabolic Panel:  Recent Labs  09/16/14 0353 09/17/14 0750 09/18/14 0514  NA 137 136 135  K 3.3* 4.9 5.1  CL 78* 81* 83*  CO2 >50* 42* 46*  GLUCOSE 90 80 83  BUN 12 12 12   CREATININE 1.18 1.07 1.09  CALCIUM 8.7* 8.9 8.9  MG 1.4* 1.5*  --    Liver Function Tests: No results for input(s): AST, ALT, ALKPHOS, BILITOT, PROT, ALBUMIN in the last 72 hours. No results for input(s): LIPASE, AMYLASE in the last 72 hours. CBC:  Recent Labs  09/18/14 0514  WBC 5.7  HGB 15.4  HCT 57.6*  MCV 94.1  PLT PENDING   Cardiac Enzymes: No results for input(s): CKTOTAL, CKMB, CKMBINDEX, TROPONINI in the last 72 hours. BNP: Invalid input(s): POCBNP D-Dimer: No results for input(s): DDIMER in the last 72 hours. Hemoglobin A1C: No results for input(s): HGBA1C in the last 72  hours. Fasting Lipid Panel:  Recent Labs  09/18/14 0514  CHOL 144  HDL 35*  LDLCALC 97  TRIG 62  CHOLHDL 4.1   Thyroid Function Tests: No results for input(s): TSH, T4TOTAL, T3FREE, THYROIDAB in the last 72 hours.  Invalid input(s): FREET3 Anemia Panel: No results for input(s): VITAMINB12, FOLATE, FERRITIN, TIBC, IRON, RETICCTPCT in the last 72 hours.  RADIOLOGY: Dg Chest 2 View  09/13/2014   CLINICAL DATA:  Acute onset of shortness of breath and lower extremity and abdominal soft tissue swelling. Initial encounter.  EXAM: CHEST  2 VIEW  COMPARISON:  Chest radiograph performed 06/11/2014  FINDINGS: The lungs are well-aerated. Vascular congestion is noted, with mildly increased interstitial markings, raising question for mild interstitial edema. There is no evidence of pleural effusion or pneumothorax.  The heart is enlarged.  No acute osseous abnormalities are seen.  IMPRESSION: Vascular congestion and cardiomegaly, with mildly increased interstitial markings, raising question for mild interstitial edema.   Electronically Signed   By: Garald Balding M.D.   On: 09/13/2014 20:03    PHYSICAL EXAM General: NAD Neck: JVP 16 cm, no thyromegaly or thyroid nodule.  Lungs: Decreased breath sounds  bilaterally.  CV: Nondisplaced PMI.  Heart regular S1/S2, no S3/S4, no murmur.  2+ edema to knees.  No carotid bruit.  Normal pedal pulses.  Abdomen: Soft, nontender, no hepatosplenomegaly, no distention.  Neurologic: Alert and oriented x 3.  Psych: Normal affect. Extremities: No clubbing or cyanosis.   TELEMETRY: Reviewed telemetry pt in NSR  ASSESSMENT AND PLAN: 29 yo with OHS/OSA, morbid obesity, diastolic CHF with prominent RV failure was admitted with acute/chronic diastolic CHF. 1. Acute/chronic diastolic CHF with prominent RV failure: EF normal on last echo with moderate RV dilation/moderate RV systolic dysfunction.  He remains volume overloaded though weight is coming down nicely with  diuresis.  - Continue Lasix gtt at current rate + acetazolamide.  2. OHS/OSA: Marked hypercarbia 8/23 with altered mental status => Bipap started.  Yesterday's ABG showed compensated respiratory acidosis.  Pulmonary has seen.   [name] 09/18/2014 7:34 AM

## 2014-09-18 NOTE — Progress Notes (Signed)
[facility] PROGRESS NOTE  Douglas [name]. QQV:[id number] DOB: [birth year] DOA: 09/13/2014 PCP: Philis Fendt, MD  Brief narrative 29 year old morbidly obese male with history of diastolic CHF, OSA/OHS on CPAP, hypertension, chronic respiratory failure on 3 L Nasal cannula at home, presented to the ED with worsening shortness of breath and anasarca in the setting of acute on chronic diastolic CHF. Patient was hypoxic when EMS arrived at home satting at 88% on his home O2 and oxygen was increased to 4 L (O2 sat improved to 92%). Chest X ray done in the ED showed vascular congestion with cardiomegaly. Hospital course prolonged due to anasarca and hypercarbic respiratory failure. Pt became lethargic with hypercarbia on 8/23 . Heart failure team consulted, who placed him on lasix drip and acetazolamide and transferred pt to step down on BiPAP.   Assessment/Plan: Acute on chronic respiratory failure secondary to acute on chronic diastolic CHF (NYHA class III) with hypercarbia and AMS Now on lasix drip + acetazolamide. Diuresing well. Has lost 16 LBS lbs already in 2 days ( still almost 25 lb over).  -Follow-up ABG showing compensated respiratory acidosis. -Appreciate heart failure team and pulmonary follow-up. Requiring 4-5 L oxygen at present. -Continue aspirin, Coreg and lisinopril. Replenish low k.    OSA/OHS Continue nighttime BiPAP (21/17 home settings). Sees pulmonary as outpt,. Reports being compliant. counseled improvtance of conitnuous o2 use and nightly BiPAP at home.   Hypokalemia Replenished  Acute conjunctivitis Resolved. . Continue Cipro eyedrops  Diet: Heart healthy Code Status: Full code Family Communication: None at bedside Disposition Plan: Continue stepdowen monitoring for today. Will transfer to telemetry if continues to improve.   Consultants:  Heart failure  Procedures:  None  Antibiotics:  None  HPI/Subjective: Patient seen and examined.  Tolerated BiPAP during the night. Has main alert and oriented and diuresing very well. (diuresed almost 7L past 24 hrs)  Objective: Filed Vitals:   09/18/14 0800  BP:   Pulse:   Temp: 97.7 F (36.5 C)  Resp:     Intake/Output Summary (Last 24 hours) at 09/18/14 1107 Last data filed at 09/18/14 0933  Gross per 24 hour  Intake   1434 ml  Output   7345 ml  Net  -5911 ml   Filed Weights   09/16/14 0553 09/17/14 0600 09/18/14 0500  Weight: 139.753 kg (308 lb 1.6 oz) 136 kg (299 lb 13.2 oz) 131.4 kg (289 lb 11 oz)    Exam:   General: no acute distress  HEENT:  moist oral mucosa,   Cardiovascular: S1 and S2, no murmurs rub or gallop  Respiratory: Improved bibasilar breath sounds  Abdomen: pitting abdominal wall edema ( Improved)   nontender, nondistended  Musculoskeletal: 2+ pitting edema (improving)  Data Reviewed: Basic Metabolic Panel:  Recent Labs Lab 09/14/14 0414 09/15/14 0340 09/16/14 0353 09/17/14 0750 09/18/14 0514  NA 137 134* 137 136 135  K 3.4* 4.2 3.3* 4.9 5.1  CL 79* 80* 78* 81* 83*  CO2 48* 44* >50* 42* 46*  GLUCOSE 95 95 90 80 83  BUN 9 12 12 12 12   CREATININE 1.02 1.10 1.18 1.07 1.09  CALCIUM 8.4* 8.1* 8.7* 8.9 8.9  MG  --   --  1.4* 1.5* 1.9   Liver Function Tests: No results for input(s): AST, ALT, ALKPHOS, BILITOT, PROT, ALBUMIN in the last 168 hours. No results for input(s): LIPASE, AMYLASE in the last 168 hours. No results for input(s): AMMONIA in the last 168 hours. CBC:  Recent Labs  Lab 09/13/14 1856 09/15/14 0340 09/18/14 0514  WBC 6.0 6.1 5.7  NEUTROABS 3.5  --   --   HGB 14.5 14.8 15.4  HCT 54.6* 56.9* 57.6*  MCV 94.3 96.3 94.1  PLT 195 245 238   Cardiac Enzymes: No results for input(s): CKTOTAL, CKMB, CKMBINDEX, TROPONINI in the last 168 hours. BNP (last 3 results)  Recent Labs  03/21/14 0400 06/09/14 1133 09/13/14 1856  BNP 163.7* 347.5* 364.6*    ProBNP (last 3 results)  Recent Labs  10/31/13 1200   PROBNP 2527.0*    CBG: No results for input(s): GLUCAP in the last 168 hours.  Recent Results (from the past 240 hour(s))  MRSA PCR Screening     Status: None   Collection Time: 09/16/14  4:22 PM  Result Value Ref Range Status   MRSA by PCR NEGATIVE NEGATIVE Final    Comment:        The GeneXpert MRSA Assay (FDA approved for NASAL specimens only), is one component of a comprehensive MRSA colonization surveillance program. It is not intended to diagnose MRSA infection nor to guide or monitor treatment for MRSA infections.      Studies: No results found.  Scheduled Meds: . acetaZOLAMIDE  250 mg Oral BID  . carvedilol  3.125 mg Oral BID WC  . ciprofloxacin  1 drop Both Eyes Q2H while awake  . enoxaparin (LOVENOX) injection  40 mg Subcutaneous Q24H  . lisinopril  2.5 mg Oral Daily  . potassium chloride  20 mEq Oral BID  . sodium chloride  3 mL Intravenous Q12H   Continuous Infusions: . furosemide (LASIX) infusion 12 mg/hr (09/17/14 2000)      Time spent: [place] Pager [phone] If 7PM-7AM, please contact night-coverage at [email or web address], password [facility] 09/18/2014, 11:07 AM  LOS: 5 days

## 2014-09-18 NOTE — Evaluation (Signed)
Physical Therapy Evaluation and Discharge Patient Details Name: Douglas [name]. MRN: [id number] DOB: October 08, 1985 Today's Date: 09/18/2014   History of Present Illness  29 y.o. male with a history of Chronic Diastolic CHF EF = 55-97%, Chronic Respiratory Failure on 3 liters O2 via [place] continuously, HTN, OSA who presents to the ED with complaints of worsening SOB and Edema extending from both of his legs into his ABD over the past 3 days. He reports not taking his Lasix Rx x 1 week, and he restarted a few days ago. He denies any Chest pain or Cough or Fevers or Chills. EMS was called to his home and he was found to have hypoxia to 88% and his oxygen was increased to 4 liters with an increase in his O2 sats to 92%.He was found to have Vascular Congestion and Cardiomegaly on Chest X-Ray.  Clinical Impression  Patient seated in recliner and agreeable to participate in PT today. Patient was able to ambulate and transfer as described below. See Vitals below to see pertinent vitals during session. At this time, patient is independent with all transfers and ambulation, he was educated on continuing exercise with nursing staff while in the hospital at least 2x each day as well as at home, and has no further PT needs. PT will sign off, thank you.    Follow Up Recommendations No PT follow up    Equipment Recommendations  None recommended by PT    Recommendations for Other Services       Precautions / Restrictions Restrictions Weight Bearing Restrictions: No      Mobility  Bed Mobility               General bed mobility comments: Patient found in recliner upon PT arrival.  Transfers Overall transfer level: Independent Equipment used: None             General transfer comment: Patient able to stand independently.  Ambulation/Gait Ambulation/Gait assistance: Independent Ambulation Distance (Feet): 550 Feet Assistive device: None Gait Pattern/deviations: WFL(Within Functional  Limits);Wide base of support   Gait velocity interpretation: at or above normal speed for age/gender General Gait Details: Patient able to walk independently. Feels he is at baseline. See vitals for O2 sats.  Stairs            Wheelchair Mobility    Modified Rankin (Stroke Patients Only)       Balance Overall balance assessment: Independent (Able to use urinal independently.)                                           Pertinent Vitals/Pain Pain Assessment: No/denies pain  Patient Saturations on Room Air at Rest = 90% Patient Saturations on Room Air while Ambulating = 81% Patient Saturations on 6 Liters of oxygen while Ambulating = 95% Patient Saturations on 4 Liters of oxygen while Ambulating = 90-93%      Home Living Family/patient expects to be discharged to:: Private residence Living Arrangements: Parent Available Help at Discharge: Family;Available 24 hours/day Type of Home: Apartment Home Access: Level entry     Home Layout: One level Home Equipment: Other (comment) (Home oxygen)      Prior Function Level of Independence: Independent               Hand Dominance        Extremity/Trunk Assessment  Lower Extremity Assessment: Overall WFL for tasks assessed         Communication   Communication: No difficulties  Cognition Arousal/Alertness: Awake/alert Behavior During Therapy: WFL for tasks assessed/performed Overall Cognitive Status: Within Functional Limits for tasks assessed                      General Comments      Exercises        Assessment/Plan    PT Assessment Patent does not need any further PT services  PT Diagnosis Other (comment) (Acute cardiopulmonary exacerbation.)   PT Problem List    PT Treatment Interventions     PT Goals (Current goals can be found in the Care Plan section) Acute Rehab PT Goals Patient Stated Goal: Go home, lose weight, discontinue oxygen use. PT  Goal Formulation: With patient Time For Goal Achievement: 10/02/14 Potential to Achieve Goals: Good    Frequency     Barriers to discharge        Co-evaluation               End of Session Equipment Utilized During Treatment: Oxygen Activity Tolerance: Patient tolerated treatment well Patient left: in chair;with call bell/phone within reach Nurse Communication: Mobility status         Time: 7262-0355 PT Time Calculation (min) (ACUTE ONLY): 26 min   Charges:   PT Evaluation $Initial PT Evaluation Tier I: 1 Procedure PT Treatments $Gait Training: 8-22 mins   PT G CodesRoanna Epley, SPT [phone] 09/18/2014, 10:59 AM  I have read, reviewed and agree with student's note.   [place] [phone] (pager)

## 2014-09-18 NOTE — Progress Notes (Signed)
Peripherally Inserted Central Catheter/Midline Placement  The IV Nurse has discussed with the patient and/or persons authorized to consent for the patient, the purpose of this procedure and the potential benefits and risks involved with this procedure.  The benefits include less needle sticks, lab draws from the catheter and patient may be discharged home with the catheter.  Risks include, but not limited to, infection, bleeding, blood clot (thrombus formation), and puncture of an artery; nerve damage and irregular heat beat.  Alternatives to this procedure were also discussed.  PICC/Midline Placement Documentation  PICC / Midline Double Lumen 09/18/14 PICC Right Brachial 42 cm 0 cm (Active)  Indication for Insertion or Continuance of Line Vasoactive infusions;Prolonged intravenous therapies;Limited venous access - need for IV therapy >5 days (PICC only) 09/18/2014  3:57 PM  Exposed Catheter (cm) 0 cm 09/18/2014  3:57 PM  Site Assessment Clean;Dry;Intact 09/18/2014  3:57 PM  Lumen #1 Status Flushed;Saline locked;Blood return noted 09/18/2014  3:57 PM  Lumen #2 Status Flushed;Saline locked;Blood return noted 09/18/2014  3:57 PM  Dressing Type Transparent 09/18/2014  3:57 PM  Dressing Status Clean;Dry;Antimicrobial disc in place;Intact 09/18/2014  3:57 PM  Line Care Connections checked and tightened 09/18/2014  3:57 PM  Line Adjustment (NICU/IV Team Only) No 09/18/2014  3:57 PM  Dressing Intervention New dressing 09/18/2014  3:57 PM  Dressing Change Due 09/25/14 09/18/2014  3:57 PM       Rolena Infante 09/18/2014, 3:59 PM

## 2014-09-18 NOTE — Consult Note (Addendum)
PULMONARY / CRITICAL CARE MEDICINE   Name: Douglas [name]. MRN: [id number] DOB: August 24, 1985    ADMISSION DATE:  09/13/2014 CONSULTATION DATE: 8/24  REFERRING MD :  [facility]  CHIEF COMPLAINT: SOB  INITIAL PRESENTATION: SOB  STUDIES:    SIGNIFICANT EVENTS:  SUBJECTIVE: no distress in chair, use BIPAP last night, was neg 4.4 liters  VITAL SIGNS: Temp:  [97.7 F (36.5 C)-98.8 F (37.1 C)] 97.7 F (36.5 C) (08/25 0800) Pulse Rate:  [77-87] 83 (08/25 0630) Resp:  [10-18] 10 (08/25 0402) BP: (110-122)/(64-85) 118/74 mmHg (08/25 0630) SpO2:  [94 %-100 %] 95 % (08/25 0402) Weight:  [131.4 kg (289 lb 11 oz)] 131.4 kg (289 lb 11 oz) (08/25 0500) HEMODYNAMICS:   VENTILATOR SETTINGS:   INTAKE / OUTPUT:  Intake/Output Summary (Last 24 hours) at 09/18/14 1029 Last data filed at 09/18/14 0933  Gross per 24 hour  Intake   1456 ml  Output   7345 ml  Net  -5889 ml    PHYSICAL EXAMINATION: General:  No distress in chair Neuro:  Intact nonfocal HEENT: jvd noted Cardiovascular: HSR RRR  Lungs:  Decreased air movement, less coarse Abdomen:  Obsese, firm, + bs Musculoskeletal: intact Skin:  +++ le edema  LABS:  CBC  Recent Labs Lab 09/13/14 1856 09/15/14 0340 09/18/14 0514  WBC 6.0 6.1 5.7  HGB 14.5 14.8 15.4  HCT 54.6* 56.9* 57.6*  PLT 195 245 238   Coag's No results for input(s): APTT, INR in the last 168 hours. BMET  Recent Labs Lab 09/16/14 0353 09/17/14 0750 09/18/14 0514  NA 137 136 135  K 3.3* 4.9 5.1  CL 78* 81* 83*  CO2 >50* 42* 46*  BUN 12 12 12   CREATININE 1.18 1.07 1.09  GLUCOSE 90 80 83   Electrolytes  Recent Labs Lab 09/16/14 0353 09/17/14 0750 09/18/14 0514  CALCIUM 8.7* 8.9 8.9  MG 1.4* 1.5* 1.9   Sepsis Markers No results for input(s): LATICACIDVEN, PROCALCITON, O2SATVEN in the last 168 hours. ABG  Recent Labs Lab 09/16/14 1345 09/17/14 0846  PHART 7.335* 7.366  PCO2ART 100* 89.0*  PO2ART 64.6* 79.0*   Liver  Enzymes No results for input(s): AST, ALT, ALKPHOS, BILITOT, ALBUMIN in the last 168 hours. Cardiac Enzymes No results for input(s): TROPONINI, PROBNP in the last 168 hours. Glucose No results for input(s): GLUCAP in the last 168 hours.  Imaging No results found.   ASSESSMENT / PLAN:  PULMONARY OETT A: OSA/OHS  Cor pulmonale COPD PAH Hypercarbia with metabolic compensation  P:   Bipap nocturnal and when every asleep, 21/17 home settings O2 24/7, 4 vs 5 liters baseline needed Was neg 4.4 liters and did well,continue neg balance per cards ABG reviewed, at baseline co2, no repeat needed May need diamox, follow bicarb daily, follow crt, may need to reduce lasix Last pcxr reviewed from 20Th, no repeat needed unless clinical change noted  CARDIOVASCULAR CVL A:  CHF, PAH P:  Diuresis as tolerated, was neg 4.4 liters, may need slight reduction  RENAL A: Mild K P:   Repeat K in am  May need slight reduction lasix, follow crt  FAMILY  - Updates: Pt up dated  - Inter-disciplinary family meet or Palliative Care meeting due by:  day 7   Intake/Output Summary (Last 24 hours) at 09/18/14 1029 Last data filed at 09/18/14 0933  Gross per 24 hour  Intake   1456 ml  Output   7345 ml  Net  -5889 ml  TODAY'S SUMMARY: 4.4 liters neg, maintain current bipap home settings   Lavon Paganini. Titus Mould, MD, FACP Pgr: [facility]   Will sign off , cal if needed Can follow up with Dr Halford Chessman upon DC

## 2014-09-19 DIAGNOSIS — E669 Obesity, unspecified: Secondary | ICD-10-CM

## 2014-09-19 LAB — BASIC METABOLIC PANEL
Anion gap: 6 (ref 5–15)
BUN: 13 mg/dL (ref 6–20)
CALCIUM: 9 mg/dL (ref 8.9–10.3)
CO2: 43 mmol/L — AB (ref 22–32)
CREATININE: 1.17 mg/dL (ref 0.61–1.24)
Chloride: 82 mmol/L — ABNORMAL LOW (ref 101–111)
GFR calc non Af Amer: >60 mL/min (ref >60)
GLUCOSE: 138 mg/dL — AB (ref 65–99)
Potassium: 4.1 mmol/L (ref 3.5–5.1)
Sodium: 131 mmol/L — ABNORMAL LOW (ref 135–145)

## 2014-09-19 LAB — CBC
HCT: 55.9 % — ABNORMAL HIGH (ref 39.0–52.0)
Hemoglobin: 15 g/dL (ref 13.0–17.0)
MCH: 24.8 pg — AB (ref 26.0–34.0)
MCHC: 26.8 g/dL — ABNORMAL LOW (ref 30.0–36.0)
MCV: 92.4 fL (ref 78.0–100.0)
PLATELETS: 229 10*3/uL (ref 150–400)
RBC: 6.05 MIL/uL — ABNORMAL HIGH (ref 4.22–5.81)
RDW: 19.8 % — AB (ref 11.5–15.5)
WBC: 5.6 10*3/uL (ref 4.0–10.5)

## 2014-09-19 LAB — MAGNESIUM: Magnesium: 1.8 mg/dL (ref 1.7–2.4)

## 2014-09-19 MED ORDER — POTASSIUM CHLORIDE CRYS ER 20 MEQ PO TBCR
20.0000 meq | EXTENDED_RELEASE_TABLET | Freq: Once | ORAL | Status: AC
  Administered 2014-09-19: 20 meq via ORAL
  Filled 2014-09-19: qty 1

## 2014-09-19 MED ORDER — ENOXAPARIN SODIUM 60 MG/0.6ML ~~LOC~~ SOLN
60.0000 mg | SUBCUTANEOUS | Status: DC
  Administered 2014-09-19 – 2014-09-20 (×2): 60 mg via SUBCUTANEOUS
  Filled 2014-09-19 (×3): qty 0.6

## 2014-09-19 MED ORDER — MAGNESIUM SULFATE 2 GM/50ML IV SOLN
2.0000 g | Freq: Once | INTRAVENOUS | Status: AC
  Administered 2014-09-19: 2 g via INTRAVENOUS
  Filled 2014-09-19: qty 50

## 2014-09-19 NOTE — Progress Notes (Signed)
[facility] PROGRESS NOTE  Douglas [name]. GGY:[id number] DOB: Jan 19, 1986 DOA: 09/13/2014 PCP: Philis Fendt, MD  Brief narrative 29 year old morbidly obese male with history of diastolic CHF, OSA/OHS on CPAP, hypertension, chronic respiratory failure on 3 L Nasal cannula at home, presented to the ED with worsening shortness of breath and anasarca in the setting of acute on chronic diastolic CHF. Patient was hypoxic when EMS arrived at home satting at 88% on his home O2 and oxygen was increased to 4 L (O2 sat improved to 92%). Chest X ray done in the ED showed vascular congestion with cardiomegaly. Hospital course prolonged due to anasarca and hypercarbic respiratory failure. Pt became lethargic with hypercarbia on 8/23 . Heart failure team consulted, who placed him on lasix drip and acetazolamide and transferred pt to step down on BiPAP.   Assessment/Plan: Acute on chronic respiratory failure secondary to acute on chronic diastolic CHF (NYHA class III) with hypercarbia and AMS Now on lasix drip + acetazolamide. Diuresing very well. Has lost 28 lbs lbs already in 3 days ( still almost 25 lb over). Could possibly switch to IV lasix today. -Follow-up ABG showing compensated respiratory acidosis. -Appreciate heart failure team and pulmonary follow-up. Requiring 4-5 L oxygen at present. -Continue aspirin, Coreg and lisinopril. Replenish low k.    OSA/OHS Continue nighttime BiPAP (21/17 home settings). Sees pulmonary as outpt,. Reports being compliant. counseled improvtance of conitnuous o2 use and nightly BiPAP at home.   Hypokalemia Replenished  Acute conjunctivitis Resolved with cipro eye drops.  Diet: Heart healthy Code Status: Full code Family Communication: None at bedside Disposition Plan: transfer to tele   Consultants:  Heart failure  Procedures:  None  Antibiotics:  None  HPI/Subjective: Patient seen and examined. Improving with  diuresis.  Objective: Filed Vitals:   09/19/14 0753  BP: 107/70  Pulse:   Temp: 98.3 F (36.8 C)  Resp: 14    Intake/Output Summary (Last 24 hours) at 09/19/14 0938 Last data filed at 09/19/14 0800  Gross per 24 hour  Intake    720 ml  Output   9100 ml  Net  -8380 ml   Filed Weights   09/17/14 0600 09/18/14 0500 09/19/14 0200  Weight: 136 kg (299 lb 13.2 oz) 131.4 kg (289 lb 11 oz) 126.145 kg (278 lb 1.6 oz)    Exam:   General: no acute distress  HEENT:  moist oral mucosa,   Cardiovascular: S1 and S2, no murmurs  Respiratory: Improved bibasilar breath sounds  Abdomen: pitting abdominal wall edema ( Improved)   nontender, nondistended  Musculoskeletal: 2+ pitting edema (improving)  Data Reviewed: Basic Metabolic Panel:  Recent Labs Lab 09/15/14 0340 09/16/14 0353 09/17/14 0750 09/18/14 0514 09/19/14 0600  NA 134* 137 136 135 131*  K 4.2 3.3* 4.9 5.1 4.1  CL 80* 78* 81* 83* 82*  CO2 44* >50* 42* 46* 43*  GLUCOSE 95 90 80 83 138*  BUN 12 12 12 12 13   CREATININE 1.10 1.18 1.07 1.09 1.17  CALCIUM 8.1* 8.7* 8.9 8.9 9.0  MG  --  1.4* 1.5* 1.9 1.8   Liver Function Tests: No results for input(s): AST, ALT, ALKPHOS, BILITOT, PROT, ALBUMIN in the last 168 hours. No results for input(s): LIPASE, AMYLASE in the last 168 hours. No results for input(s): AMMONIA in the last 168 hours. CBC:  Recent Labs Lab 09/13/14 1856 09/15/14 0340 09/18/14 0514 09/19/14 0600  WBC 6.0 6.1 5.7 5.6  NEUTROABS 3.5  --   --   --  HGB 14.5 14.8 15.4 15.0  HCT 54.6* 56.9* 57.6* 55.9*  MCV 94.3 96.3 94.1 92.4  PLT 195 245 238 229   Cardiac Enzymes: No results for input(s): CKTOTAL, CKMB, CKMBINDEX, TROPONINI in the last 168 hours. BNP (last 3 results)  Recent Labs  03/21/14 0400 06/09/14 1133 09/13/14 1856  BNP 163.7* 347.5* 364.6*    ProBNP (last 3 results)  Recent Labs  10/31/13 1200  PROBNP 2527.0*    CBG: No results for input(s): GLUCAP in the last  168 hours.  Recent Results (from the past 240 hour(s))  MRSA PCR Screening     Status: None   Collection Time: 09/16/14  4:22 PM  Result Value Ref Range Status   MRSA by PCR NEGATIVE NEGATIVE Final    Comment:        The GeneXpert MRSA Assay (FDA approved for NASAL specimens only), is one component of a comprehensive MRSA colonization surveillance program. It is not intended to diagnose MRSA infection nor to guide or monitor treatment for MRSA infections.      Studies: No results found.  Scheduled Meds: . acetaZOLAMIDE  250 mg Oral BID  . carvedilol  3.125 mg Oral BID WC  . ciprofloxacin  1 drop Both Eyes Q2H while awake  . enoxaparin (LOVENOX) injection  60 mg Subcutaneous Q24H  . lisinopril  2.5 mg Oral Daily  . magnesium sulfate 1 - 4 g bolus IVPB  2 g Intravenous Once  . potassium chloride  20 mEq Oral BID  . sodium chloride  10-40 mL Intracatheter Q12H  . sodium chloride  3 mL Intravenous Q12H   Continuous Infusions: . furosemide (LASIX) infusion 12 mg/hr (09/19/14 0401)      Time spent: [place], [place] [facility] Pager [phone] If 7PM-7AM, please contact night-coverage at [email or web address], password [facility] 09/19/2014, 9:38 AM  LOS: 6 days

## 2014-09-19 NOTE — Progress Notes (Addendum)
Patient ID: Douglas [name]., male   DOB: Mar 12, 1985, 29 y.o.   MRN: [id number]   SUBJECTIVE: Excellent diuresis yesterday, weight down another 11 lbs.   PICC placed yesterday.  CO2 46 -> 43.   Scheduled Meds: . acetaZOLAMIDE  250 mg Oral BID  . carvedilol  3.125 mg Oral BID WC  . ciprofloxacin  1 drop Both Eyes Q2H while awake  . enoxaparin (LOVENOX) injection  40 mg Subcutaneous Q24H  . lisinopril  2.5 mg Oral Daily  . potassium chloride  20 mEq Oral BID  . sodium chloride  10-40 mL Intracatheter Q12H  . sodium chloride  3 mL Intravenous Q12H   Continuous Infusions: . furosemide (LASIX) infusion 12 mg/hr (09/19/14 0401)   PRN Meds:.sodium chloride, acetaminophen, albuterol, ondansetron (ZOFRAN) IV, sodium chloride, sodium chloride     Filed Vitals:   09/18/14 2323 09/19/14 0030 09/19/14 0200 09/19/14 0359  BP: 111/67   120/84  Pulse:  82    Temp: 98 F (36.7 C)   98.3 F (36.8 C)  TempSrc: Oral   Oral  Resp: 16 14  14   Height:      Weight:   278 lb 1.6 oz (126.145 kg)   SpO2: 96% 100%  96%    Intake/Output Summary (Last 24 hours) at 09/19/14 0719 Last data filed at 09/19/14 0650  Gross per 24 hour  Intake    504 ml  Output   8750 ml  Net  -8246 ml    LABS: Basic Metabolic Panel:  Recent Labs  09/18/14 0514 09/19/14 0600  NA 135 131*  K 5.1 4.1  CL 83* 82*  CO2 46* 43*  GLUCOSE 83 138*  BUN 12 13  CREATININE 1.09 1.17  CALCIUM 8.9 9.0  MG 1.9 1.8   Liver Function Tests: No results for input(s): AST, ALT, ALKPHOS, BILITOT, PROT, ALBUMIN in the last 72 hours. No results for input(s): LIPASE, AMYLASE in the last 72 hours. CBC:  Recent Labs  09/18/14 0514 09/19/14 0600  WBC 5.7 5.6  HGB 15.4 15.0  HCT 57.6* 55.9*  MCV 94.1 92.4  PLT 238 229   Cardiac Enzymes: No results for input(s): CKTOTAL, CKMB, CKMBINDEX, TROPONINI in the last 72 hours. BNP: Invalid input(s): POCBNP D-Dimer: No results for input(s): DDIMER in the last 72  hours. Hemoglobin A1C: No results for input(s): HGBA1C in the last 72 hours. Fasting Lipid Panel:  Recent Labs  09/18/14 0514  CHOL 144  HDL 35*  LDLCALC 97  TRIG 62  CHOLHDL 4.1   Thyroid Function Tests: No results for input(s): TSH, T4TOTAL, T3FREE, THYROIDAB in the last 72 hours.  Invalid input(s): FREET3 Anemia Panel: No results for input(s): VITAMINB12, FOLATE, FERRITIN, TIBC, IRON, RETICCTPCT in the last 72 hours.  RADIOLOGY: Dg Chest 2 View  09/13/2014   CLINICAL DATA:  Acute onset of shortness of breath and lower extremity and abdominal soft tissue swelling. Initial encounter.  EXAM: CHEST  2 VIEW  COMPARISON:  Chest radiograph performed 06/11/2014  FINDINGS: The lungs are well-aerated. Vascular congestion is noted, with mildly increased interstitial markings, raising question for mild interstitial edema. There is no evidence of pleural effusion or pneumothorax.  The heart is enlarged.  No acute osseous abnormalities are seen.  IMPRESSION: Vascular congestion and cardiomegaly, with mildly increased interstitial markings, raising question for mild interstitial edema.   Electronically Signed   By: Garald Balding M.D.   On: 09/13/2014 20:03    PHYSICAL EXAM General: NAD Neck: JVP  14 cm, no thyromegaly or thyroid nodule. No carotid bruit. Lungs: Decreased bilaterally.  CV: Nondisplaced PMI.  Heart regular S1/S2, no S3/S4, no murmur appreciated.  1-2+ edema to knees.  Abdomen: Soft, NT, ND, no HSM. Neurologic: Alert and oriented x 3.  Psych: Normal affect. Extremities: No clubbing or cyanosis.  Pedal pulses 2+.  Moves all 4 extremities without difficulty.  Telemetry: NSR 80s  ASSESSMENT AND PLAN: 29 yo with OHS/OSA, morbid obesity, diastolic CHF with prominent RV failure was admitted with acute/chronic diastolic CHF. 1. Acute/chronic diastolic CHF with prominent RV failure: EF normal on last echo with moderate RV dilation/moderate RV systolic dysfunction.   - His fluid  status remains elevated although he continues to do well with diuresis.  - Remains ~ 10 lbs from baseline of 267-270lbs. - Continue Lasix gtt at current rate + acetazolamide.  2. OHS/OSA: Marked hypercarbia 8/23 with altered mental status => Bipap started.   - ABG 09/17/14 showed compensated respiratory acidosis.   - Pulmonary following   Dispo: May be stable to go home later this weekend vs Monday depending on his continued diuresis.  Shirley Friar PA-C 09/19/2014 7:19 AM  Patient seen with PA, agree with the above note.  He is diuresing very well.  Creatinine and HCO3 stable.  Continue current meds today, still volume overloaded.  He has PICC so we'll follow CVPs.   [name] 09/19/2014 1:40 PM

## 2014-09-20 LAB — BASIC METABOLIC PANEL
ANION GAP: 7 (ref 5–15)
BUN: 13 mg/dL (ref 6–20)
CALCIUM: 8.9 mg/dL (ref 8.9–10.3)
CO2: 40 mmol/L — ABNORMAL HIGH (ref 22–32)
CREATININE: 1.01 mg/dL (ref 0.61–1.24)
Chloride: 84 mmol/L — ABNORMAL LOW (ref 101–111)
Glucose, Bld: 180 mg/dL — ABNORMAL HIGH (ref 65–99)
Potassium: 3.9 mmol/L (ref 3.5–5.1)
Sodium: 131 mmol/L — ABNORMAL LOW (ref 135–145)

## 2014-09-20 LAB — CARBOXYHEMOGLOBIN
CARBOXYHEMOGLOBIN: 2.5 % — AB (ref 0.5–1.5)
Methemoglobin: 1.2 % (ref 0.0–1.5)
O2 SAT: 76.2 %
Total hemoglobin: 16.2 g/dL (ref 13.5–18.0)

## 2014-09-20 MED ORDER — FUROSEMIDE 80 MG PO TABS
80.0000 mg | ORAL_TABLET | Freq: Two times a day (BID) | ORAL | Status: DC
  Administered 2014-09-20 – 2014-09-21 (×2): 80 mg via ORAL
  Filled 2014-09-20 (×2): qty 1

## 2014-09-20 NOTE — Progress Notes (Signed)
[facility] PROGRESS NOTE  Douglas [name]. FAO:[id number] DOB: August 29, 1985 DOA: 09/13/2014 PCP: Philis Fendt, MD  Brief narrative 29 year old morbidly obese male with history of diastolic CHF, OSA/OHS on CPAP, hypertension, chronic respiratory failure on 3 L Nasal cannula at home, presented to the ED with worsening shortness of breath and anasarca in the setting of acute on chronic diastolic CHF. Patient was hypoxic when EMS arrived at home satting at 88% on his home O2 and oxygen was increased to 4 L (O2 sat improved to 92%). Chest X ray done in the ED showed vascular congestion with cardiomegaly. Hospital course prolonged due to anasarca and hypercarbic respiratory failure. Pt became lethargic with hypercarbia on 8/23 . Heart failure team consulted, who placed him on lasix drip and acetazolamide and transferred pt to step down on BiPAP.   Assessment/Plan: Acute on chronic respiratory failure secondary to acute on chronic diastolic CHF (NYHA class III) with hypercarbia and AMS - on lasix drip + acetazolamide. Diuresed very well and down to baseline weight ( lost almost 45 lbs since admission). Compensatory respiratory acidosis on follow-up ABG. -Appreciate heart failure team and pulmonary follow-up. Requiring 4-5 L oxygen at present. -Continue aspirin, Coreg and lisinopril. -Pending cardiology follow up today. Can possibly transition to IV Lasix and discharged him in next 24 hours.    OSA/OHS Continue nighttime BiPAP (21/17 home settings). Sees pulmonary as outpt,. Reports being compliant. counseled improvtance of conitnuous o2 use and nightly BiPAP at home.   Hypokalemia Replenished  Acute conjunctivitis Resolved with cipro eye drops.  Diet: Heart healthy Code Status: Full code Family Communication: None at bedside Disposition Plan: transfer to tele   Consultants:  Heart failure  Procedures:  None  Antibiotics:  None  HPI/Subjective: Patient seen and  examined. Diuresing very well.  Objective: Filed Vitals:   09/20/14 0900  BP: 115/65  Pulse: 87  Temp: 98.1 F (36.7 C)  Resp: 15    Intake/Output Summary (Last 24 hours) at 09/20/14 1240 Last data filed at 09/20/14 1100  Gross per 24 hour  Intake   1836 ml  Output   7725 ml  Net  -5889 ml   Filed Weights   09/18/14 0500 09/19/14 0200 09/20/14 0416  Weight: 131.4 kg (289 lb 11 oz) 126.145 kg (278 lb 1.6 oz) 118.978 kg (262 lb 4.8 oz)    Exam:   General: no acute distress  HEENT:  moist oral mucosa, no JVD  Cardiovascular: S1 and S2, no murmurs  Respiratory: Improved bibasilar breath sounds  Abdomen: improved abdominal wall pitting edema. nontender, nondistended  Musculoskeletal: 1+ pitting edema (improved)  Data Reviewed: Basic Metabolic Panel:  Recent Labs Lab 09/16/14 0353 09/17/14 0750 09/18/14 0514 09/19/14 0600 09/20/14 0306  NA 137 136 135 131* 131*  K 3.3* 4.9 5.1 4.1 3.9  CL 78* 81* 83* 82* 84*  CO2 >50* 42* 46* 43* 40*  GLUCOSE 90 80 83 138* 180*  BUN 12 12 12 13 13   CREATININE 1.18 1.07 1.09 1.17 1.01  CALCIUM 8.7* 8.9 8.9 9.0 8.9  MG 1.4* 1.5* 1.9 1.8  --    Liver Function Tests: No results for input(s): AST, ALT, ALKPHOS, BILITOT, PROT, ALBUMIN in the last 168 hours. No results for input(s): LIPASE, AMYLASE in the last 168 hours. No results for input(s): AMMONIA in the last 168 hours. CBC:  Recent Labs Lab 09/13/14 1856 09/15/14 0340 09/18/14 0514 09/19/14 0600  WBC 6.0 6.1 5.7 5.6  NEUTROABS 3.5  --   --   --  HGB 14.5 14.8 15.4 15.0  HCT 54.6* 56.9* 57.6* 55.9*  MCV 94.3 96.3 94.1 92.4  PLT 195 245 238 229   Cardiac Enzymes: No results for input(s): CKTOTAL, CKMB, CKMBINDEX, TROPONINI in the last 168 hours. BNP (last 3 results)  Recent Labs  03/21/14 0400 06/09/14 1133 09/13/14 1856  BNP 163.7* 347.5* 364.6*    ProBNP (last 3 results)  Recent Labs  10/31/13 1200  PROBNP 2527.0*    CBG: No results for  input(s): GLUCAP in the last 168 hours.  Recent Results (from the past 240 hour(s))  MRSA PCR Screening     Status: None   Collection Time: 09/16/14  4:22 PM  Result Value Ref Range Status   MRSA by PCR NEGATIVE NEGATIVE Final    Comment:        The GeneXpert MRSA Assay (FDA approved for NASAL specimens only), is one component of a comprehensive MRSA colonization surveillance program. It is not intended to diagnose MRSA infection nor to guide or monitor treatment for MRSA infections.      Studies: No results found.  Scheduled Meds: . acetaZOLAMIDE  250 mg Oral BID  . carvedilol  3.125 mg Oral BID WC  . ciprofloxacin  1 drop Both Eyes Q2H while awake  . enoxaparin (LOVENOX) injection  60 mg Subcutaneous Q24H  . lisinopril  2.5 mg Oral Daily  . potassium chloride  20 mEq Oral BID  . sodium chloride  10-40 mL Intracatheter Q12H  . sodium chloride  3 mL Intravenous Q12H   Continuous Infusions: . furosemide (LASIX) infusion 12 mg/hr (09/20/14 0800)      Time spent: [place], [place]  [facility] Pager [phone] If 7PM-7AM, please contact night-coverage at [email or web address], password [facility] 09/20/2014, 12:40 PM  LOS: 7 days

## 2014-09-20 NOTE — Progress Notes (Signed)
Patient asked to be put on BIPAP around 0030. RT placed patient on BIPAP at 0040. Patient is resting comfortably in chair. RT will continue to monitor.

## 2014-09-21 DIAGNOSIS — J9601 Acute respiratory failure with hypoxia: Secondary | ICD-10-CM

## 2014-09-21 LAB — BASIC METABOLIC PANEL
Anion gap: 7 (ref 5–15)
BUN: 15 mg/dL (ref 6–20)
CALCIUM: 9 mg/dL (ref 8.9–10.3)
CHLORIDE: 87 mmol/L — AB (ref 101–111)
CO2: 38 mmol/L — AB (ref 22–32)
CREATININE: 1.03 mg/dL (ref 0.61–1.24)
GFR calc non Af Amer: >60 mL/min (ref >60)
GLUCOSE: 79 mg/dL (ref 65–99)
Potassium: 3.9 mmol/L (ref 3.5–5.1)
Sodium: 132 mmol/L — ABNORMAL LOW (ref 135–145)

## 2014-09-21 LAB — MAGNESIUM: Magnesium: 2.1 mg/dL (ref 1.7–2.4)

## 2014-09-21 MED ORDER — FUROSEMIDE 40 MG PO TABS: 80.0000 mg | ORAL_TABLET | Freq: Two times a day (BID) | ORAL | Status: DC

## 2014-09-21 MED ORDER — CARVEDILOL 3.125 MG PO TABS: 3.1250 mg | ORAL_TABLET | Freq: Two times a day (BID) | ORAL | Status: DC

## 2014-09-21 MED ORDER — LISINOPRIL 2.5 MG PO TABS: 2.5000 mg | ORAL_TABLET | Freq: Every day | ORAL | Status: DC

## 2014-09-21 MED ORDER — ASPIRIN 325 MG PO TBEC: 325.0000 mg | DELAYED_RELEASE_TABLET | Freq: Every day | ORAL | Status: DC

## 2014-09-21 MED ORDER — POTASSIUM CHLORIDE CRYS ER 20 MEQ PO TBCR: 20.0000 meq | EXTENDED_RELEASE_TABLET | Freq: Every day | ORAL | Status: DC

## 2014-09-21 NOTE — Progress Notes (Signed)
DC orders received.  Patient stable with no S/S of distress.  Medication and discharge information reviewed with patient.  Patient DC home with family. [place], Ardeth Sportsman

## 2014-09-21 NOTE — Progress Notes (Signed)
RT placed patient on BIPAP. Patient is resting comfortably, RT will continue to monitor.

## 2014-09-21 NOTE — Discharge Summary (Signed)
Physician Discharge Summary  Douglas [name]. OFB:[id number] DOB: Nov 27, 1985 DOA: 09/13/2014  PCP: Philis Fendt, MD  Admit date: 09/13/2014 Discharge date: 09/21/2014  Time spent: 35 minutes  Recommendations for Outpatient Follow-up:  1. Discharge home with outpatient follow-up with PCP and cardiology/ Heart failure clinic.   Discharge weight:  257 pounds  Discharge Diagnoses:  Principal Problem:   Acute on chronic diastolic CHF (congestive heart failure), NYHA class 3   Active Problems:   Acute respiratory failure with hypoxia and hypercarbia   Hypertension   Obesity   OSA treated with BiPAP   Anasarca   Morbid obesity   High cholesterol   Discharge Condition: Fair  Diet recommendation: Heart healthy  Filed Weights   09/19/14 0200 09/20/14 0416 09/21/14 0323  Weight: 126.145 kg (278 lb 1.6 oz) 118.978 kg (262 lb 4.8 oz) 116.711 kg (257 lb 4.8 oz)    History of present illness:  Please refer to H&P for details, in brief,29 year old morbidly obese male with history of diastolic CHF, OSA/OHS on CPAP, hypertension, chronic respiratory failure on 3 L Nasal cannula at home, presented to the ED with worsening shortness of breath and anasarca in the setting of acute on chronic diastolic CHF. Patient was hypoxic when EMS arrived at home satting at 88% on his home O2 and oxygen was increased to 4 L (O2 sat improved to 92%). Chest X ray done in the ED showed vascular congestion with cardiomegaly. Hospital course prolonged due to anasarca and hypercarbic respiratory failure. Pt became lethargic with hypercarbia on 8/23 . Heart failure team consulted, who placed him on lasix drip and acetazolamide and transferred pt to step down on BiPAP.  Hospital Course:  Acute on chronic respiratory failure secondary to acute on chronic diastolic CHF (NYHA class III) with hypercarbia and AMS -Patient noted improved with IV Lasix and placed on IV Lasix drip along with acetazolamide, BiPAP  and transfer to stepdown unit.  -Diuresed very well and lost about 50 pounds since admission  -Appreciate heart failure team and pulmonary follow-up. Requiring 4-5 L oxygen at baseline. -Continue aspirin. -Added Coreg and lisinopril. -Patient clinically stable and can be discharged home. Will increase his Lasix to 80 mg twice daily. Has follow-up appointment with cardiology on 9/1. Heart failure instructions and dietary counseling provided.    OSA/OHS Continue nighttime BiPAP (21/17 home settings). Sees pulmonary as outpt,. Reports being compliant. counseled on importance of conitnuous o2 use and nightly BiPAP at home.   Hypokalemia Replenished  Acute conjunctivitis Resolved with cipro eye drops.  Diet: Heart healthy  Code Status: Full code Family Communication: None at bedside Disposition Plan: Home with outpatient follow-up   Consultants:  Heart failure  Procedures:  None  Antibiotics:  None    Discharge Exam: Filed Vitals:   09/21/14 0800  BP:   Pulse:   Temp:   Resp: 20     General: Middle aged obese male no acute distress  HEENT: moist oral mucosa, no JVD  Cardiovascular: S1 and S2, no murmurs  Respiratory: Breath sounds bilaterally  Abdomen: Resolved abdominal wall pitting edema. nontender, nondistended  Musculoskeletal: trace pitting edema bilaterally   CNS: Alert and oriented   Discharge Instructions    Current Discharge Medication List    START taking these medications   Details  carvedilol (COREG) 3.125 MG tablet Take 1 tablet (3.125 mg total) by mouth 2 (two) times daily with a meal. Qty: 60 tablet, Refills: 0    lisinopril (PRINIVIL,ZESTRIL) 2.5 MG tablet Take  1 tablet (2.5 mg total) by mouth daily. Qty: 30 tablet, Refills: 0    potassium chloride SA (K-DUR,KLOR-CON) 20 MEQ tablet Take 1 tablet (20 mEq total) by mouth daily. Qty: 30 tablet, Refills: 0      CONTINUE these medications which have CHANGED   Details  aspirin  325 MG EC tablet Take 1 tablet (325 mg total) by mouth daily. Qty: 30 tablet, Refills: 0    furosemide (LASIX) 40 MG tablet Take 2 tablets (80 mg total) by mouth 2 (two) times daily. Qty: 120 tablet, Refills: 3   Associated Diagnoses: Essential hypertension      CONTINUE these medications which have NOT CHANGED   Details  albuterol (PROVENTIL HFA;VENTOLIN HFA) 108 (90 BASE) MCG/ACT inhaler Inhale 1-2 puffs into the lungs every 6 (six) hours as needed for wheezing or shortness of breath. Qty: 1 Inhaler, Refills: 0      STOP taking these medications     amLODipine (NORVASC) 5 MG tablet        No Known Allergies Follow-up Information    Follow up with AVBUERE,EDWIN A, MD. Schedule an appointment as soon as possible for a visit in 1 week.   Specialty:  Internal Medicine   Contact information:   [address] [place] [address] [phone]       Follow up with Tarri Fuller, PA-C On 09/25/2014.   Specialties:  Physician Assistant, Radiology, Interventional Cardiology   Why:  at 9 AM   Contact information:   [address] [place] [address] [phone]        The results of significant diagnostics from this hospitalization (including imaging, microbiology, ancillary and laboratory) are listed below for reference.    Significant Diagnostic Studies: Dg Chest 2 View  09/13/2014   CLINICAL DATA:  Acute onset of shortness of breath and lower extremity and abdominal soft tissue swelling. Initial encounter.  EXAM: CHEST  2 VIEW  COMPARISON:  Chest radiograph performed 06/11/2014  FINDINGS: The lungs are well-aerated. Vascular congestion is noted, with mildly increased interstitial markings, raising question for mild interstitial edema. There is no evidence of pleural effusion or pneumothorax.  The heart is enlarged.  No acute osseous abnormalities are seen.  IMPRESSION: Vascular congestion and cardiomegaly, with mildly increased interstitial markings, raising  question for mild interstitial edema.   Electronically Signed   By: Garald Balding M.D.   On: 09/13/2014 20:03    Microbiology: Recent Results (from the past 240 hour(s))  MRSA PCR Screening     Status: None   Collection Time: 09/16/14  4:22 PM  Result Value Ref Range Status   MRSA by PCR NEGATIVE NEGATIVE Final    Comment:        The GeneXpert MRSA Assay (FDA approved for NASAL specimens only), is one component of a comprehensive MRSA colonization surveillance program. It is not intended to diagnose MRSA infection nor to guide or monitor treatment for MRSA infections.      Labs: Basic Metabolic Panel:  Recent Labs Lab 09/16/14 0353 09/17/14 0750 09/18/14 0514 09/19/14 0600 09/20/14 0306 09/21/14 0713  NA 137 136 135 131* 131* 132*  K 3.3* 4.9 5.1 4.1 3.9 3.9  CL 78* 81* 83* 82* 84* 87*  CO2 >50* 42* 46* 43* 40* 38*  GLUCOSE 90 80 83 138* 180* 79  BUN 12 12 12 13 13 15   CREATININE 1.18 1.07 1.09 1.17 1.01 1.03  CALCIUM 8.7* 8.9 8.9 9.0 8.9 9.0  MG 1.4* 1.5* 1.9  1.8  --  2.1   Liver Function Tests: No results for input(s): AST, ALT, ALKPHOS, BILITOT, PROT, ALBUMIN in the last 168 hours. No results for input(s): LIPASE, AMYLASE in the last 168 hours. No results for input(s): AMMONIA in the last 168 hours. CBC:  Recent Labs Lab 09/15/14 0340 09/18/14 0514 09/19/14 0600  WBC 6.1 5.7 5.6  HGB 14.8 15.4 15.0  HCT 56.9* 57.6* 55.9*  MCV 96.3 94.1 92.4  PLT 245 238 229   Cardiac Enzymes: No results for input(s): CKTOTAL, CKMB, CKMBINDEX, TROPONINI in the last 168 hours. BNP: BNP (last 3 results)  Recent Labs  03/21/14 0400 06/09/14 1133 09/13/14 1856  BNP 163.7* 347.5* 364.6*    ProBNP (last 3 results)  Recent Labs  10/31/13 1200  PROBNP 2527.0*    CBG: No results for input(s): GLUCAP in the last 168 hours.     SignedLouellen Molder  [facility] 09/21/2014, 8:07 AM

## 2014-09-21 NOTE — Discharge Instructions (Signed)

## 2014-09-25 ENCOUNTER — Ambulatory Visit (INDEPENDENT_AMBULATORY_CARE_PROVIDER_SITE_OTHER): Payer: Medicaid Other | Admitting: Physician Assistant

## 2014-09-25 ENCOUNTER — Encounter: Payer: Self-pay | Admitting: Physician Assistant

## 2014-09-25 VITALS — BP 148/98 | HR 80 | Ht 64.0 in | Wt 253.5 lb

## 2014-09-25 DIAGNOSIS — I5032 Chronic diastolic (congestive) heart failure: Secondary | ICD-10-CM | POA: Diagnosis not present

## 2014-09-25 DIAGNOSIS — I10 Essential (primary) hypertension: Secondary | ICD-10-CM

## 2014-09-25 DIAGNOSIS — G4733 Obstructive sleep apnea (adult) (pediatric): Secondary | ICD-10-CM

## 2014-09-25 DIAGNOSIS — E669 Obesity, unspecified: Secondary | ICD-10-CM

## 2014-09-25 DIAGNOSIS — I2781 Cor pulmonale (chronic): Secondary | ICD-10-CM

## 2014-09-25 NOTE — Patient Instructions (Signed)
Schedule follow up with Dr.Crenshaw in 4 weeks

## 2014-09-25 NOTE — Progress Notes (Signed)
Patient ID: Douglas [name]., male   DOB: May 12, 1985, 29 y.o.   MRN: [id number]    Date:  09/25/2014   ID:  Douglas [name]., DOB 1985/09/29, MRN [id number]  PCP:  Philis Fendt, MD  Primary Cardiologist:  Stanford Breed  Chief Complaint  Patient presents with  . Follow-up    fluid gain     History of Present Illness: Douglas [name]. is a 29 y.o. male with a hx of pulmonary hypertension, OSA, obesity, substance abuse (+ cocaine, + cigs), HTN, CKD and non-adherence to medical Rx.   Admitted 10/2013 with volume overload/anasarca secondary to right heart failure in the setting of cor pulmonale secondary to OSA, obesity/hypoventilation syndrome. He was diuresed with IV Lasix and lost 27 pounds. VQ scan was negative for chronic pulmonary embolism. Lower extremity Dopplers were negative for DVT. Patient was noted have proteinuria. Workup was unremarkable. He was started on ACEI and outpatient follow-up with nephrology was recommended. He was also noted to have polycythemia. He underwent therapeutic phlebotomy. Outpatient follow-up with hematology was recommended. OP FU with pulmonary was also recommended for control of OSA.   Admitted 02/2014 for hypoxic respiratory failure and acute encephalopathy due to decompensated cor pulmonale/diastolic CHF from untreated OHS/OSA. He was diuresed and followed by CCM service. He had been off BiPap. Adjustments were made to his device as it was not working properly. ACEI was held due to increased creatinine. He had minimal rise in troponins felt to be from demand ischemia. Echo demonstrated normal LVF, no RWMA, mod reduced RVSF.   Patient was discharged from the hospital on 09/21/2014 where he was treated for acute on chronic diastolic heart failure, acute respiratory failure with hypoxia and hypercarbia. His discharge weight was 257 pounds.  Today his weight is 253 pounds.  Patient reports she's feeling much better. He is breathing a lot  better.  He does have some lower extremity edema which is greater on the right than the left.  He said his weight before diuresis was over 300 pounds.  The patient currently denies nausea, vomiting, fever, chest pain, shortness of breath, orthopnea, dizziness, PND, cough, congestion, abdominal pain, hematochezia, melena, lower extremity edema, claudication.  Wt Readings from Last 3 Encounters:  09/25/14 253 lb 8 oz (114.987 kg)  09/21/14 257 lb 4.8 oz (116.711 kg)  07/29/14 290 lb (131.543 kg)     Past Medical History  Diagnosis Date  . Hypertension   . Obesity   . Tobacco abuse   . Substance abuse 8/31/213    cociane "first time use"  . High cholesterol   . CHF (congestive heart failure)   . Chronic bronchitis   . OSA on CPAP   . Arthritis     "hands, feet" (10/31/2013)  . Pneumonia 09/2011    Current Outpatient Prescriptions  Medication Sig Dispense Refill  . albuterol (PROVENTIL HFA;VENTOLIN HFA) 108 (90 BASE) MCG/ACT inhaler Inhale 1-2 puffs into the lungs every 6 (six) hours as needed for wheezing or shortness of breath. 1 Inhaler 0  . aspirin 325 MG EC tablet Take 1 tablet (325 mg total) by mouth daily. 30 tablet 0  . carvedilol (COREG) 3.125 MG tablet Take 1 tablet (3.125 mg total) by mouth 2 (two) times daily with a meal. 60 tablet 0  . furosemide (LASIX) 40 MG tablet Take 2 tablets (80 mg total) by mouth 2 (two) times daily. 120 tablet 3  . lisinopril (PRINIVIL,ZESTRIL) 2.5 MG tablet Take 1 tablet (2.5 mg total)  by mouth daily. 30 tablet 0  . potassium chloride SA (K-DUR,KLOR-CON) 20 MEQ tablet Take 1 tablet (20 mEq total) by mouth daily. 30 tablet 0   No current facility-administered medications for this visit.    Allergies:   No Known Allergies  Social History:  The patient  reports that he quit smoking about 10 months ago. His smoking use included Cigarettes. He has a 4.5 pack-year smoking history. He has never used smokeless tobacco. He reports that he uses illicit  drugs (Cocaine). He reports that he does not drink alcohol.   Family history:   Family History  Problem Relation Age of Onset  . Heart disease Mother     Pacemaker  . Heart attack Neg Hx   . Stroke Neg Hx   . Hypertension Father   . Cancer Paternal Grandfather     ROS:  Please see the history of present illness.  All other systems reviewed and negative.   PHYSICAL EXAM: VS:  BP 148/98 mmHg  Pulse 80  Ht 5\' 4"  (1.626 m)  Wt 253 lb 8 oz (114.987 kg)  BMI 43.49 kg/m2 Overly obese, well developed, in no acute distress HEENT: Pupils are equal round react to light accommodation extraocular movements are intact.  Neck: no JVDNo cervical lymphadenopathy. Cardiac: Regular rate and rhythm without murmurs rubs or gallops. Lungs:  clear to auscultation bilaterally, no wheezing, rhonchi or rales Abd: soft, albeit tender left lower quadrant, positive bowel sounds all quadrants, Ext: 1+ right greater than left lower extremity edema.  2+ radial and dorsalis pedis pulses. Skin: warm and dry Neuro:  Grossly normal, does not appear confused    ASSESSMENT AND PLAN:  Problem List Items Addressed This Visit    OSA treated with BiPAP   Obesity   Hypertension - Primary   Cor pulmonale   Chronic diastolic CHF (congestive heart failure)      Patient's here for posthospital follow-up weight is 253 pounds in the office which is stable from his discharge 3 days ago.  I tried to explain the importance of daily weight monitoring and low-sodium diet.  I told him twice that he should call the office if he gains 3 pounds in 24 hours or 5 pounds in a week.  He has not taken his blood pressure medications this morning he says he did not because he was coming here. I'm not going to make any changes.  His blood pressure the day before discharge was 115/65 on current regimen.   We'll bring him back in 30 days for follow-up since I don't think he has a very good understanding of how to manage his own  health.

## 2014-09-26 ENCOUNTER — Telehealth: Payer: Self-pay | Admitting: Physician Assistant

## 2014-09-26 NOTE — Telephone Encounter (Signed)
Per Answering Service: Needs an order for Compression Stockings.

## 2014-09-26 NOTE — Telephone Encounter (Signed)
Returned call to home health - no voice mail box set up - unable to leave message.

## 2014-09-26 NOTE — Telephone Encounter (Signed)
Home health needing order for compression stockings - what mmhg would you indicate?

## 2014-09-26 NOTE — Telephone Encounter (Signed)
20-19mmHg.  Thanks Ovid Curd!  Tarri Fuller, [facility]

## 2014-10-10 ENCOUNTER — Telehealth: Payer: Self-pay | Admitting: Cardiology

## 2014-10-10 MED ORDER — CARVEDILOL 3.125 MG PO TABS: 3.1250 mg | ORAL_TABLET | Freq: Two times a day (BID) | ORAL | Status: DC

## 2014-10-10 NOTE — Telephone Encounter (Signed)
New Prob   1. Which medications need to be refilled? Carvedilol 3.125 mg   2. Which pharmacy is medication to be sent to? [facility]  3. Do they need a 30 day or 90 day supply? 90 Day  4. Would they like a call back once the medication has been sent to the pharmacy? No

## 2014-10-10 NOTE — Telephone Encounter (Signed)
Rx(s) sent to pharmacy electronically.  

## 2014-10-14 NOTE — Telephone Encounter (Signed)
Left message for home health.

## 2014-10-15 ENCOUNTER — Inpatient Hospital Stay (HOSPITAL_COMMUNITY): Admission: RE | Admit: 2014-10-15 | Payer: Medicaid Other | Source: Ambulatory Visit

## 2014-10-30 ENCOUNTER — Ambulatory Visit (HOSPITAL_COMMUNITY)
Admission: RE | Admit: 2014-10-30 | Discharge: 2014-10-30 | Disposition: A | Discharge status: Home / Self Care | Payer: Medicaid Other | Source: Ambulatory Visit | Attending: Cardiology | Admitting: Cardiology

## 2014-10-30 VITALS — BP 160/100 | HR 80 | Wt 252.0 lb

## 2014-10-30 DIAGNOSIS — Z87891 Personal history of nicotine dependence: Secondary | ICD-10-CM | POA: Insufficient documentation

## 2014-10-30 DIAGNOSIS — I159 Secondary hypertension, unspecified: Secondary | ICD-10-CM

## 2014-10-30 DIAGNOSIS — I10 Essential (primary) hypertension: Secondary | ICD-10-CM | POA: Insufficient documentation

## 2014-10-30 DIAGNOSIS — G4733 Obstructive sleep apnea (adult) (pediatric): Secondary | ICD-10-CM | POA: Diagnosis not present

## 2014-10-30 DIAGNOSIS — Z8249 Family history of ischemic heart disease and other diseases of the circulatory system: Secondary | ICD-10-CM | POA: Diagnosis not present

## 2014-10-30 DIAGNOSIS — Z7982 Long term (current) use of aspirin: Secondary | ICD-10-CM | POA: Diagnosis not present

## 2014-10-30 DIAGNOSIS — Z79899 Other long term (current) drug therapy: Secondary | ICD-10-CM | POA: Insufficient documentation

## 2014-10-30 DIAGNOSIS — E7800 Pure hypercholesterolemia, unspecified: Secondary | ICD-10-CM | POA: Insufficient documentation

## 2014-10-30 DIAGNOSIS — I5032 Chronic diastolic (congestive) heart failure: Secondary | ICD-10-CM | POA: Diagnosis not present

## 2014-10-30 MED ORDER — LISINOPRIL 5 MG PO TABS: 5.0000 mg | ORAL_TABLET | Freq: Two times a day (BID) | ORAL | Status: DC

## 2014-10-30 NOTE — Progress Notes (Signed)
Advanced Heart Failure Medication Review by a Pharmacist  Does the patient  feel that his/her medications are working for him/her?  yes  Has the patient been experiencing any side effects to the medications prescribed?  no  Does the patient measure his/her own blood pressure or blood glucose at home?  no   Does the patient have any problems obtaining medications due to transportation or finances?   no  Understanding of regimen: good Understanding of indications: good Potential of compliance: good Patient understands to avoid NSAIDs. Patient understands to avoid decongestants.  Issues to address at subsequent visits: None   Pharmacist comments:  Douglas [name] is a 29 yo M presenting without a medication list. He reports excellent compliance with all of his medications. He did not have any specific medication-related questions or concerns for me at this time.   [name], PharmD, BCPS, CPP Clinical Pharmacist Pager: [phone] Phone: [phone] 10/30/2014 11:24 AM     Time with patient: 4 minutes Preparation and documentation time: 2 minutes Total time: 6 minutes

## 2014-10-30 NOTE — Patient Instructions (Signed)
Increase Lisinopril 5 mg twice a day  Labs and BP check next week  Follow up in 2 months with [name]  Do the following things EVERYDAY: 1. Weigh yourself in the morning before breakfast. Write it down and keep it in a log. 2. Take your medicines as prescribed 3. Eat low salt foods-Limit salt (sodium) to 2000 mg per day.  4. Stay as active as you can everyday 5. Limit all fluids for the day to less than 2 liters

## 2014-10-30 NOTE — Progress Notes (Addendum)
Patient ID: Douglas [name]., male   DOB: March 02, 1985, 29 y.o.   MRN: [id number] PCP: Primary Cardiologist:  HPI: 29 yo with OHS/OSA, morbid obesity, HTN, diastolic CHF with prominent RV failure.   Admitted to [facility] with volume overload. Diuresed with lasix drip and diamox. Transitioned to lasix 80 mg twice a day. Discharge weight 257 pounds.   He returns for post hospital follow up. Denies SOB/Orthopnea. Walking at home Weight at home 247-248 pounds. Using Bipap at nightly.   Taking all medications. Currently not working. Not smoking or drinking alcohol.   ECHO 03/20/2014 EF 60-65%  Labs 09/21/2014: K 3.9 Creatinine 1.03     ROS: All systems negative except as listed in HPI, PMH and Problem List.  SH:  Social History   Social History  . Marital Status: Single    Spouse Name: N/A  . Number of Children: 2  . Years of Education: N/A   Occupational History  . N/A    Social History Main Topics  . Smoking status: Former Smoker -- 0.50 packs/day for 9 years    Types: Cigarettes    Quit date: 10/31/2013  . Smokeless tobacco: Never Used  . Alcohol Use: No  . Drug Use: Yes    Special: Cocaine     Comment: 10/31/2013 "might use cocaine once/month" - Has not used since 2015.   Marland Kitchen Sexual Activity: No   Other Topics Concern  . Not on file   Social History Narrative    FH:  Family History  Problem Relation Age of Onset  . Heart disease Mother     Pacemaker  . Heart attack Neg Hx   . Stroke Neg Hx   . Hypertension Father   . Cancer Paternal Grandfather     Past Medical History  Diagnosis Date  . Hypertension   . Obesity   . Tobacco abuse   . Substance abuse 8/31/213    cociane "first time use"  . High cholesterol   . CHF (congestive heart failure)   . Chronic bronchitis   . OSA on CPAP   . Arthritis     "hands, feet" (10/31/2013)  . Pneumonia 09/2011    Current Outpatient Prescriptions  Medication Sig Dispense Refill  . albuterol (PROVENTIL HFA;VENTOLIN HFA)  108 (90 BASE) MCG/ACT inhaler Inhale 1-2 puffs into the lungs every 6 (six) hours as needed for wheezing or shortness of breath. 1 Inhaler 0  . aspirin 325 MG EC tablet Take 1 tablet (325 mg total) by mouth daily. 30 tablet 0  . carvedilol (COREG) 3.125 MG tablet Take 1 tablet (3.125 mg total) by mouth 2 (two) times daily with a meal. 60 tablet 11  . furosemide (LASIX) 40 MG tablet Take 2 tablets (80 mg total) by mouth 2 (two) times daily. 120 tablet 3  . lisinopril (PRINIVIL,ZESTRIL) 2.5 MG tablet Take 2.5 mg by mouth 2 (two) times daily.    . potassium chloride SA (K-DUR,KLOR-CON) 20 MEQ tablet Take 1 tablet (20 mEq total) by mouth daily. 30 tablet 0   No current facility-administered medications for this encounter.    Filed Vitals:   10/30/14 1117  BP: 160/100  Pulse: 80  Weight: 252 lb (114.306 kg)  SpO2: 97%    PHYSICAL EXAM:  General:  Well appearing. No resp difficulty. Ambulated in clinic without difficulty.  HEENT: normal Neck: supple. JVP 5-6 . Carotids 2+ bilaterally; no bruits. No lymphadenopathy or thryomegaly appreciated. Cor: PMI normal. Regular rate & rhythm. No rubs, gallops  or murmurs. Lungs: clear Abdomen: obese, soft, nontender, nondistended. No hepatosplenomegaly. No bruits or masses. Good bowel sounds. Extremities: no cyanosis, clubbing, rash, edema Neuro: alert & orientedx3, cranial nerves grossly intact. Moves all 4 extremities w/o difficulty. Affect pleasant.    ASSESSMENT & PLAN: 29 yo with OHS/OSA, morbid obesity, diastolic CHF with prominent RV failure.   1.Chronic Diastolic CHF with prominent RV failure: EF normal on last echo 02/2014 with moderate RV dilation/moderate RV systolic dysfunction.NYHA II.  Volume status stable. Continue lasix 80 mg twice a day. Provided with a scale and weight chart.  Reinforced daily weights, low salt diet, and limiting fluid intake to < 2 liters per day.   2. OHS/OSA: Continue Bipap nightly. Will need follow up with  pulmonary.   3. HTN- Elevated but just had medications.  Continue carvedilol at current dose. Increase lisinopril 5 mg twice a day. Check BMET next week with BP check.   Follow up in 2 months to reassess volume status.   CLEGG,[name] NP-C  11:36 AM  -

## 2014-11-06 ENCOUNTER — Telehealth (HOSPITAL_COMMUNITY): Payer: Self-pay

## 2014-11-06 ENCOUNTER — Other Ambulatory Visit (HOSPITAL_COMMUNITY): Payer: Medicaid Other

## 2014-11-06 NOTE — Telephone Encounter (Signed)
Pharmacy called,  Confirmed new Lisinopril dose

## 2014-11-11 ENCOUNTER — Telehealth (HOSPITAL_COMMUNITY): Payer: Self-pay

## 2014-11-11 NOTE — Telephone Encounter (Signed)
Returned call to [place].  LMTCB

## 2014-11-12 ENCOUNTER — Telehealth (HOSPITAL_COMMUNITY): Payer: Self-pay | Admitting: *Deleted

## 2014-11-12 ENCOUNTER — Other Ambulatory Visit (HOSPITAL_COMMUNITY): Payer: Medicaid Other

## 2014-11-12 NOTE — Telephone Encounter (Signed)
Douglas [name] called from the vocational center she is trying to get him a job Needs paper work filled out on how much he can lift and do.  She will fax over paper work for [place] to fill out and send back .

## 2014-11-12 NOTE — Telephone Encounter (Signed)
Lynder Parents called and left message to call her back  Tried to call no answer and left a message to call back

## 2014-11-14 ENCOUNTER — Telehealth (HOSPITAL_COMMUNITY): Payer: Self-pay

## 2014-11-14 NOTE — Telephone Encounter (Signed)
Patient calling to see if he missed his lab draw.  Missed lab appointment.  Patient request November the first of the month.

## 2014-11-23 NOTE — Progress Notes (Signed)
HPI: FU pulmonary hypertension, cor pulmonale, diastolic CHF, OSA, obesity, substance abuse (+ cocaine, + cigs), HTN, CKD and non-adherence to medical Rx. Also with h/o proteinuria and polycythemia. VQ 10/16 negative. Lower ext venous dopplers neg. Since last seen,   Studies: - Echo (10/15): Severe LVH, EF 55-60%, moderate RVE with septal flattening suggesting cor pulmonale and significantly elevated PA pressure, mild RAE  Echo 03/20/14 - Mild LVH. EF 60-65%. Wall motion was normal - Left atrium: The atrium was mildly dilated. - Right ventricle: The cavity size was moderately dilated. Wall thickness was normal. Systolic function was moderately reduced. - Right atrium: The atrium was severely dilated. - Pericardium, extracardiac: A trivial pericardial effusion was identified. There was no evidence of hemodynamic compromise.  Current Outpatient Prescriptions  Medication Sig Dispense Refill  . albuterol (PROVENTIL HFA;VENTOLIN HFA) 108 (90 BASE) MCG/ACT inhaler Inhale 1-2 puffs into the lungs every 6 (six) hours as needed for wheezing or shortness of breath. 1 Inhaler 0  . aspirin 325 MG EC tablet Take 1 tablet (325 mg total) by mouth daily. 30 tablet 0  . carvedilol (COREG) 3.125 MG tablet Take 1 tablet (3.125 mg total) by mouth 2 (two) times daily with a meal. 60 tablet 11  . furosemide (LASIX) 40 MG tablet Take 2 tablets (80 mg total) by mouth 2 (two) times daily. 120 tablet 3  . lisinopril (PRINIVIL,ZESTRIL) 5 MG tablet Take 1 tablet (5 mg total) by mouth 2 (two) times daily. 180 tablet 3  . potassium chloride SA (K-DUR,KLOR-CON) 20 MEQ tablet Take 1 tablet (20 mEq total) by mouth daily. 30 tablet 0   No current facility-administered medications for this visit.     Past Medical History  Diagnosis Date  . Hypertension   . Obesity   . Tobacco abuse   . Substance abuse 8/31/213    cociane "first time use"  . High cholesterol   . CHF (congestive heart failure)   . Chronic  bronchitis   . OSA on CPAP   . Arthritis     "hands, feet" (10/31/2013)  . Pneumonia 09/2011    Past Surgical History  Procedure Laterality Date  . No past surgeries    . Tubes in ears      Social History   Social History  . Marital Status: Single    Spouse Name: N/A  . Number of Children: 2  . Years of Education: N/A   Occupational History  . N/A    Social History Main Topics  . Smoking status: Former Smoker -- 0.50 packs/day for 9 years    Types: Cigarettes    Quit date: 10/31/2013  . Smokeless tobacco: Never Used  . Alcohol Use: No  . Drug Use: Yes    Special: Cocaine     Comment: 10/31/2013 "might use cocaine once/month" - Has not used since 2015.   Marland Kitchen Sexual Activity: No   Other Topics Concern  . Not on file   Social History Narrative    ROS: no fevers or chills, productive cough, hemoptysis, dysphasia, odynophagia, melena, hematochezia, dysuria, hematuria, rash, seizure activity, orthopnea, PND, pedal edema, claudication. Remaining systems are negative.  Physical Exam: Well-developed well-nourished in no acute distress.  Skin is warm and dry.  HEENT is normal.  Neck is supple.  Chest is clear to auscultation with normal expansion.  Cardiovascular exam is regular rate and rhythm.  Abdominal exam nontender or distended. No masses palpated. Extremities show no edema. neuro grossly intact  ECG     This encounter was created in error - please disregard.

## 2014-11-24 ENCOUNTER — Encounter: Payer: Medicaid Other | Admitting: Cardiology

## 2014-11-26 ENCOUNTER — Other Ambulatory Visit (HOSPITAL_COMMUNITY): Payer: Self-pay | Admitting: Cardiology

## 2014-11-26 ENCOUNTER — Ambulatory Visit (HOSPITAL_COMMUNITY)
Admission: RE | Admit: 2014-11-26 | Discharge: 2014-11-26 | Disposition: A | Discharge status: Home / Self Care | Payer: Medicaid Other | Source: Ambulatory Visit | Attending: Cardiology | Admitting: Cardiology

## 2014-11-26 VITALS — BP 156/108 | HR 76 | Wt 256.2 lb

## 2014-11-26 DIAGNOSIS — I272 Pulmonary hypertension, unspecified: Secondary | ICD-10-CM

## 2014-11-26 DIAGNOSIS — I445 Left posterior fascicular block: Secondary | ICD-10-CM | POA: Diagnosis not present

## 2014-11-26 DIAGNOSIS — I509 Heart failure, unspecified: Secondary | ICD-10-CM | POA: Diagnosis not present

## 2014-11-26 DIAGNOSIS — R9431 Abnormal electrocardiogram [ECG] [EKG]: Secondary | ICD-10-CM | POA: Diagnosis not present

## 2014-11-26 DIAGNOSIS — I5022 Chronic systolic (congestive) heart failure: Secondary | ICD-10-CM

## 2014-11-26 DIAGNOSIS — R42 Dizziness and giddiness: Secondary | ICD-10-CM

## 2014-11-26 DIAGNOSIS — R002 Palpitations: Secondary | ICD-10-CM

## 2014-11-26 LAB — BASIC METABOLIC PANEL
ANION GAP: 11 (ref 5–15)
BUN: 14 mg/dL (ref 6–20)
CALCIUM: 9.3 mg/dL (ref 8.9–10.3)
CO2: 29 mmol/L (ref 22–32)
Chloride: 97 mmol/L — ABNORMAL LOW (ref 101–111)
Creatinine, Ser: 1.08 mg/dL (ref 0.61–1.24)
GLUCOSE: 98 mg/dL (ref 65–99)
POTASSIUM: 4.2 mmol/L (ref 3.5–5.1)
SODIUM: 137 mmol/L (ref 135–145)

## 2014-11-26 NOTE — Addendum Note (Signed)
Encounter addended by: Kerry Dory, CMA on: 11/26/2014 11:53 AM<BR>     Documentation filed: Visit Diagnoses, Orders, Dx Association, Notes Section, Patient Instructions Section

## 2014-11-26 NOTE — Progress Notes (Addendum)
Pt presents to office for routine labs, however on 11/25/14 pt c/o an episode of increased dizzynes and chest flutters "pounding". Pt reports these symptoms are new for him and concerning  Per VO Dr.McLean EKG, vitals and 2 week event monitor Patient should be sure to take meds daily and keep follow up as scheduled

## 2014-11-26 NOTE — Addendum Note (Signed)
Encounter addended by: Kerry Dory, CMA on: 11/26/2014 11:36 AM<BR>     Documentation filed: Dx Association, Notes Section, Vitals Section, Orders

## 2014-11-26 NOTE — Patient Instructions (Signed)
Be sure to take your medications as prescribed EVERY DAY!  Your physician has recommended that you wear an event monitor. Event monitors are medical devices that record the heart's electrical activity. Doctors most often [place] these monitors to diagnose arrhythmias. Arrhythmias are problems with the speed or rhythm of the heartbeat. The monitor is a small, portable device. You can wear one while you do your normal daily activities. This is usually used to diagnose what is causing palpitations/syncope (passing out).  Your physician recommends that you schedule a follow-up appointment as scheduled next month

## 2014-11-27 ENCOUNTER — Ambulatory Visit (INDEPENDENT_AMBULATORY_CARE_PROVIDER_SITE_OTHER): Payer: Medicaid Other

## 2014-11-27 ENCOUNTER — Telehealth (HOSPITAL_COMMUNITY): Payer: Self-pay

## 2014-11-27 DIAGNOSIS — I272 Other secondary pulmonary hypertension: Secondary | ICD-10-CM | POA: Diagnosis not present

## 2014-11-27 DIAGNOSIS — R42 Dizziness and giddiness: Secondary | ICD-10-CM

## 2014-11-27 DIAGNOSIS — R002 Palpitations: Secondary | ICD-10-CM | POA: Diagnosis not present

## 2014-11-27 NOTE — Telephone Encounter (Signed)
Faxed form to [employer]

## 2014-12-29 NOTE — Progress Notes (Signed)
Patient ID: Douglas [name]., male   DOB: 1985-12-04, 29 y.o.   MRN: [id number] PCP: Primary Cardiologist:  HPI: Douglas [name] is a 29 yo with OHS/OSA, morbid obesity, HTN, diastolic CHF with prominent RV failure.   Admitted in August 2016  to [facility] with volume overload. Diuresed with lasix drip and diamox. Transitioned to lasix 80 mg twice a day. Discharge weight 257 pounds.   He returns for  follow up. Last visit lisinopril was increased to 5 mg twice daily. Denies SOB/Orthopnea. Does admit to mild dyspnea with steps. Not exercising. Weight at home trending to 270 pounds. Has gained 20 pounds in 2 months.  Using Bipap at nightly.  Taking all medications but was only taking lasix  40 mg twice a day.  Currently not working. Not smoking or drinking alcohol.   ECHO 03/20/2014 EF 60-65%  Labs 09/21/2014: K 3.9 Creatinine 1.03  Labs 11/26/2014: K 4.2 Creatinine 1.08   ROS: All systems negative except as listed in HPI, PMH and Problem List.  SH:  Social History   Social History  . Marital Status: Single    Spouse Name: N/A  . Number of Children: 2  . Years of Education: N/A   Occupational History  . N/A    Social History Main Topics  . Smoking status: Former Smoker -- 0.50 packs/day for 9 years    Types: Cigarettes    Quit date: 10/31/2013  . Smokeless tobacco: Never Used  . Alcohol Use: No  . Drug Use: Yes    Special: Cocaine     Comment: 10/31/2013 "might use cocaine once/month" - Has not used since 2015.   Marland Kitchen Sexual Activity: No   Other Topics Concern  . Not on file   Social History Narrative    FH:  Family History  Problem Relation Age of Onset  . Heart disease Mother     Pacemaker  . Heart attack Neg Hx   . Stroke Neg Hx   . Hypertension Father   . Cancer Paternal Grandfather     Past Medical History  Diagnosis Date  . Hypertension   . Obesity   . Tobacco abuse   . Substance abuse 8/31/213    cociane "first time use"  . High cholesterol   . CHF  (congestive heart failure)   . Chronic bronchitis   . OSA on CPAP   . Arthritis     "hands, feet" (10/31/2013)  . Pneumonia 09/2011    Current Outpatient Prescriptions  Medication Sig Dispense Refill  . albuterol (PROVENTIL HFA;VENTOLIN HFA) 108 (90 BASE) MCG/ACT inhaler Inhale 1-2 puffs into the lungs every 6 (six) hours as needed for wheezing or shortness of breath. 1 Inhaler 0  . aspirin 325 MG EC tablet Take 1 tablet (325 mg total) by mouth daily. 30 tablet 0  . carvedilol (COREG) 3.125 MG tablet Take 1 tablet (3.125 mg total) by mouth 2 (two) times daily with a meal. 60 tablet 11  . furosemide (LASIX) 40 MG tablet Take 2 tablets (80 mg total) by mouth 2 (two) times daily. 120 tablet 3  . lisinopril (PRINIVIL,ZESTRIL) 5 MG tablet Take 1 tablet (5 mg total) by mouth 2 (two) times daily. 180 tablet 3  . potassium chloride SA (K-DUR,KLOR-CON) 20 MEQ tablet Take 1 tablet (20 mEq total) by mouth daily. 30 tablet 0   No current facility-administered medications for this encounter.    Filed Vitals:   12/30/14 1105  BP: 122/94  Pulse: 94  Weight:  272 lb 6.4 oz (123.56 kg)  SpO2: 99%    PHYSICAL EXAM:  General:  Well appearing. No resp difficulty. Ambulated in clinic without difficulty.  HEENT: normal Neck: supple. JVP ~10 . Carotids 2+ bilaterally; no bruits. No lymphadenopathy or thryomegaly appreciated. Cor: PMI normal. Regular rate & rhythm. No rubs, gallops or murmurs. Lungs: clear Abdomen: obese, soft, nontender, + distended. No hepatosplenomegaly. No bruits or masses. Good bowel sounds. Extremities: no cyanosis, clubbing, rash, edema Neuro: alert & orientedx3, cranial nerves grossly intact. Moves all 4 extremities w/o difficulty. Affect pleasant.    ASSESSMENT & PLAN: 29 yo with OHS/OSA, morbid obesity, diastolic CHF with prominent RV failure.   1.Chronic Diastolic CHF with prominent RV failure: EF normal on last echo 02/2014 with moderate RV dilation/moderate RV systolic  dysfunction.NYHA II.   Volume status elelvated likely due to in part to increased fluid intake and he was only taking 40 mg of lasix twice a day instead of 80 mg twice a day. Weight up 10 to 15 pounds from baseline.  Today I have asked him to take lasix 80 mg twice a day.  Check BMET today.   Reinforced daily weights, low salt diet, and limiting fluid intake to < 2 liters per day.   2. OHS/OSA: Continue Bipap nightly. Will need follow up with pulmonary.   3. HTN-  Better but still a little high. Continue current regimen and increase lasix as above. 4. Morbid Obesity- Discussed portion control. Encouraged him to weight.    Follow up next week to reassess volume status.  Check BMET today and next week.   [name] Clegg NP-C  11:33 AM  -

## 2014-12-30 ENCOUNTER — Ambulatory Visit (HOSPITAL_COMMUNITY)
Admission: RE | Admit: 2014-12-30 | Discharge: 2014-12-30 | Disposition: A | Discharge status: Home / Self Care | Payer: Medicaid Other | Source: Ambulatory Visit | Attending: Cardiology | Admitting: Cardiology

## 2014-12-30 VITALS — BP 122/94 | HR 94 | Wt 272.4 lb

## 2014-12-30 DIAGNOSIS — I10 Essential (primary) hypertension: Secondary | ICD-10-CM

## 2014-12-30 DIAGNOSIS — I110 Hypertensive heart disease with heart failure: Secondary | ICD-10-CM | POA: Diagnosis not present

## 2014-12-30 DIAGNOSIS — I5032 Chronic diastolic (congestive) heart failure: Secondary | ICD-10-CM | POA: Diagnosis not present

## 2014-12-30 DIAGNOSIS — I509 Heart failure, unspecified: Secondary | ICD-10-CM | POA: Diagnosis not present

## 2014-12-30 DIAGNOSIS — G4733 Obstructive sleep apnea (adult) (pediatric): Secondary | ICD-10-CM | POA: Insufficient documentation

## 2014-12-30 LAB — BASIC METABOLIC PANEL
Anion gap: 9 (ref 5–15)
BUN: 17 mg/dL (ref 6–20)
CALCIUM: 8.9 mg/dL (ref 8.9–10.3)
CO2: 33 mmol/L — ABNORMAL HIGH (ref 22–32)
CREATININE: 1.05 mg/dL (ref 0.61–1.24)
Chloride: 97 mmol/L — ABNORMAL LOW (ref 101–111)
Glucose, Bld: 91 mg/dL (ref 65–99)
Potassium: 4.7 mmol/L (ref 3.5–5.1)
SODIUM: 139 mmol/L (ref 135–145)

## 2014-12-30 MED ORDER — FUROSEMIDE 40 MG PO TABS: 80.0000 mg | ORAL_TABLET | Freq: Two times a day (BID) | ORAL | Status: DC

## 2014-12-30 NOTE — Progress Notes (Signed)
Advanced Heart Failure Medication Review by a Pharmacist  Does the patient  feel that his/her medications are working for him/her?  yes  Has the patient been experiencing any side effects to the medications prescribed?  no  Does the patient measure his/her own blood pressure or blood glucose at home?  no   Does the patient have any problems obtaining medications due to transportation or finances?   no  Understanding of regimen: good Understanding of indications: good Potential of compliance: good Patient understands to avoid NSAIDs. Patient understands to avoid decongestants.  Issues to address at subsequent visits: None   Pharmacist comments:  Mr. Deshazer is a pleasant 29 yo M presenting without a medication list but with good recall of his regimen including dosages. He reports good compliance with his medications and did not have any specific medication-related questions or concerns for me at this time.   [name], PharmD, BCPS, CPP Clinical Pharmacist Pager: [phone] Phone: [phone] 12/30/2014 11:27 AM      Time with patient: 4 minutes Preparation and documentation time: 2 minutes Total time: 6 minutes

## 2014-12-30 NOTE — Patient Instructions (Signed)
INCREASE Lasix to 80 mg ( 2 tabs) twice a day  Labs today  Your physician recommends that you schedule a follow-up appointment in: 1 week in the NP clinic  Do the following things EVERYDAY: 1) Weigh yourself in the morning before breakfast. Write it down and keep it in a log. 2) Take your medicines as prescribed 3) Eat low salt foods-Limit salt (sodium) to 2000 mg per day.  4) Stay as active as you can everyday 5) Limit all fluids for the day to less than 2 liters 6)

## 2015-01-08 ENCOUNTER — Ambulatory Visit (HOSPITAL_COMMUNITY)
Admission: RE | Admit: 2015-01-08 | Discharge: 2015-01-08 | Disposition: A | Discharge status: Home / Self Care | Payer: Medicaid Other | Source: Ambulatory Visit | Attending: Internal Medicine | Admitting: Internal Medicine

## 2015-01-08 VITALS — BP 104/86 | HR 85 | Wt 267.8 lb

## 2015-01-08 DIAGNOSIS — G4733 Obstructive sleep apnea (adult) (pediatric): Secondary | ICD-10-CM | POA: Insufficient documentation

## 2015-01-08 DIAGNOSIS — E669 Obesity, unspecified: Secondary | ICD-10-CM

## 2015-01-08 DIAGNOSIS — I5032 Chronic diastolic (congestive) heart failure: Secondary | ICD-10-CM

## 2015-01-08 DIAGNOSIS — I10 Essential (primary) hypertension: Secondary | ICD-10-CM | POA: Insufficient documentation

## 2015-01-08 LAB — BASIC METABOLIC PANEL
Anion gap: 12 (ref 5–15)
BUN: 16 mg/dL (ref 6–20)
CALCIUM: 9.2 mg/dL (ref 8.9–10.3)
CHLORIDE: 92 mmol/L — AB (ref 101–111)
CO2: 33 mmol/L — AB (ref 22–32)
CREATININE: 1.18 mg/dL (ref 0.61–1.24)
GFR calc Af Amer: >60 mL/min (ref >60)
GFR calc non Af Amer: >60 mL/min (ref >60)
GLUCOSE: 88 mg/dL (ref 65–99)
Potassium: 5 mmol/L (ref 3.5–5.1)
Sodium: 137 mmol/L (ref 135–145)

## 2015-01-08 NOTE — Patient Instructions (Signed)
Follow up in 2 months.   Limits fluid to < 2 liters per day.   Do the following things EVERYDAY: 1) Weigh yourself in the morning before breakfast. Write it down and keep it in a log. 2) Take your medicines as prescribed 3) Eat low salt foods-Limit salt (sodium) to 2000 mg per day.  4) Stay as active as you can everyday 5) Limit all fluids for the day to less than 2 liters

## 2015-01-08 NOTE — Progress Notes (Signed)
Patient ID: [name], male   DOB: 03/28/1985, 29 y.o.   MRN: [id number]  PCP: Dr Rich Reining Primary Cardiologist:Dr Aundra Dubin   HPI: Mr Bywaters is a 29 yo with OHS/OSA, morbid obesity, HTN, diastolic CHF with prominent RV failure.   Admitted in August 2016  to [facility] with volume overload. Diuresed with lasix drip and diamox. Transitioned to lasix 80 mg twice a day. Discharge weight 257 pounds.   He returns for  follow up. Last visit lasix was increased to 80 mg twice a day. Overall feeling much better. Denies SOB/PND/Orthopnea. Weight at home 264-265 pounds.  Using Bipap at nightly.  Currently not working. Not smoking or drinking alcohol. Drinking > 2 liters of water.   ECHO 03/20/2014 EF 60-65%  Labs 09/21/2014: K 3.9 Creatinine 1.03  Labs 11/26/2014: K 4.2 Creatinine 1.08  Labs 12/30/2014: K 4.7 Creatinine 1.05   ROS: All systems negative except as listed in HPI, PMH and Problem List.  SH:  Social History   Social History  . Marital Status: Single    Spouse Name: N/A  . Number of Children: 2  . Years of Education: N/A   Occupational History  . N/A    Social History Main Topics  . Smoking status: Former Smoker -- 0.50 packs/day for 9 years    Types: Cigarettes    Quit date: 10/31/2013  . Smokeless tobacco: Never Used  . Alcohol Use: No  . Drug Use: Yes    Special: Cocaine     Comment: 10/31/2013 "might use cocaine once/month" - Has not used since 2015.   Marland Kitchen Sexual Activity: No   Other Topics Concern  . Not on file   Social History Narrative    FH:  Family History  Problem Relation Age of Onset  . Heart disease Mother     Pacemaker  . Heart attack Neg Hx   . Stroke Neg Hx   . Hypertension Father   . Cancer Paternal Grandfather     Past Medical History  Diagnosis Date  . Hypertension   . Obesity   . Tobacco abuse   . Substance abuse 8/31/213    cociane "first time use"  . High cholesterol   . CHF (congestive heart failure)   . Chronic bronchitis   .  OSA on CPAP   . Arthritis     "hands, feet" (10/31/2013)  . Pneumonia 09/2011    Current Outpatient Prescriptions  Medication Sig Dispense Refill  . albuterol (PROVENTIL HFA;VENTOLIN HFA) 108 (90 BASE) MCG/ACT inhaler Inhale 1-2 puffs into the lungs every 6 (six) hours as needed for wheezing or shortness of breath. 1 Inhaler 0  . aspirin 325 MG EC tablet Take 1 tablet (325 mg total) by mouth daily. 30 tablet 0  . carvedilol (COREG) 3.125 MG tablet Take 1 tablet (3.125 mg total) by mouth 2 (two) times daily with a meal. 60 tablet 11  . furosemide (LASIX) 40 MG tablet Take 2 tablets (80 mg total) by mouth 2 (two) times daily. 120 tablet 2  . lisinopril (PRINIVIL,ZESTRIL) 5 MG tablet Take 1 tablet (5 mg total) by mouth 2 (two) times daily. 180 tablet 3  . potassium chloride SA (K-DUR,KLOR-CON) 20 MEQ tablet Take 1 tablet (20 mEq total) by mouth daily. 30 tablet 0   No current facility-administered medications for this encounter.    Filed Vitals:   01/08/15 1458  BP: 104/86  Pulse: 85  Weight: 267 lb 12.8 oz (121.473 kg)  SpO2: 94%  PHYSICAL EXAM:  General:  Well appearing. No resp difficulty. Ambulated in clinic without difficulty.  HEENT: normal Neck: supple. JVP ~5-6 . Carotids 2+ bilaterally; no bruits. No lymphadenopathy or thryomegaly appreciated. Cor: PMI normal. Regular rate & rhythm. No rubs, gallops or murmurs. Lungs: clear Abdomen: obese, soft, nontender, nonistended. No hepatosplenomegaly. No bruits or masses. Good bowel sounds. Extremities: no cyanosis, clubbing, rash, edema Neuro: alert & orientedx3, cranial nerves grossly intact. Moves all 4 extremities w/o difficulty. Affect pleasant.    ASSESSMENT & PLAN: 29 yo with OHS/OSA, morbid obesity, diastolic CHF with prominent RV failure. with OHS/OSA, morbid obesity, diastolic CHF with prominent RV failure.   1.Chronic Diastolic CHF with prominent RV failure: EF normal on last echo 02/2014 with moderate RV dilation/moderate RV systolic dysfunction.NYHA II.   Volume status improved.  Continue lasix 80 mg twice a day.  Weight down 5 pounds. He needs to cut back the amount fluids he is drinking.  Check BMET today.---> K 5.0 Creatinine 1.18   Reinforced daily weights, low salt diet, and limiting fluid intake to < 2 liters per day.   2. OHS/OSA: Continue Bipap nightly. Will need follow up with pulmonary.   3. HTN-  Stable. Continue current regimen.  4. Morbid Obesity- Discussed portion control. Encouraged him to lose weight.    Follow up in 2 months.    [name] Clegg NP-C  3:14 PM  -

## 2015-01-08 NOTE — Progress Notes (Signed)
Advanced Heart Failure Medication Review by a Pharmacist  Does the patient  feel that his/her medications are working for him/her?  yes  Has the patient been experiencing any side effects to the medications prescribed?  no  Does the patient measure his/her own blood pressure or blood glucose at home?  no   Does the patient have any problems obtaining medications due to transportation or finances?   no  Understanding of regimen: good Understanding of indications: good Potential of compliance: good Patient understands to avoid NSAIDs. Patient understands to avoid decongestants.  Issues to address at subsequent visits: None   Pharmacist comments:  Douglas [name] is a pleasant 29 yo M presenting without a medication list. He reports good compliance with his medications and states that he has been taking his medications correctly since his last visit. He did not have any other specific medication-related questions or concerns for me at this time.   [name], PharmD, BCPS, CPP Clinical Pharmacist Pager: [phone] Phone: [phone] 01/08/2015 2:58 PM      Time with patient: 4 minutes Preparation and documentation time: 2 minutes Total time: 6 minutes

## 2015-01-29 ENCOUNTER — Ambulatory Visit: Payer: Medicaid Other | Admitting: Pulmonary Disease

## 2015-04-14 ENCOUNTER — Emergency Department (HOSPITAL_COMMUNITY): Payer: Medicaid Other

## 2015-04-14 ENCOUNTER — Inpatient Hospital Stay (HOSPITAL_COMMUNITY): Payer: Medicaid Other

## 2015-04-14 ENCOUNTER — Inpatient Hospital Stay (HOSPITAL_COMMUNITY)
Admission: EM | Admit: 2015-04-14 | Discharge: 2015-04-28 | DRG: 291 | Disposition: A | Discharge status: Home Health | Payer: Medicaid Other | Attending: Internal Medicine | Admitting: Internal Medicine

## 2015-04-14 ENCOUNTER — Inpatient Hospital Stay (HOSPITAL_COMMUNITY): Admission: RE | Admit: 2015-04-14 | Payer: Medicaid Other | Source: Ambulatory Visit

## 2015-04-14 ENCOUNTER — Encounter (HOSPITAL_COMMUNITY): Payer: Self-pay

## 2015-04-14 DIAGNOSIS — J9601 Acute respiratory failure with hypoxia: Secondary | ICD-10-CM

## 2015-04-14 DIAGNOSIS — Z452 Encounter for adjustment and management of vascular access device: Secondary | ICD-10-CM

## 2015-04-14 DIAGNOSIS — J9602 Acute respiratory failure with hypercapnia: Secondary | ICD-10-CM

## 2015-04-14 DIAGNOSIS — Z9119 Patient's noncompliance with other medical treatment and regimen: Secondary | ICD-10-CM

## 2015-04-14 DIAGNOSIS — D751 Secondary polycythemia: Secondary | ICD-10-CM | POA: Diagnosis present

## 2015-04-14 DIAGNOSIS — E7800 Pure hypercholesterolemia, unspecified: Secondary | ICD-10-CM | POA: Diagnosis present

## 2015-04-14 DIAGNOSIS — E7220 Disorder of urea cycle metabolism, unspecified: Secondary | ICD-10-CM | POA: Diagnosis present

## 2015-04-14 DIAGNOSIS — Z7982 Long term (current) use of aspirin: Secondary | ICD-10-CM | POA: Diagnosis not present

## 2015-04-14 DIAGNOSIS — Z79899 Other long term (current) drug therapy: Secondary | ICD-10-CM

## 2015-04-14 DIAGNOSIS — J690 Pneumonitis due to inhalation of food and vomit: Secondary | ICD-10-CM | POA: Insufficient documentation

## 2015-04-14 DIAGNOSIS — I272 Other secondary pulmonary hypertension: Secondary | ICD-10-CM | POA: Diagnosis present

## 2015-04-14 DIAGNOSIS — J9622 Acute and chronic respiratory failure with hypercapnia: Secondary | ICD-10-CM | POA: Diagnosis not present

## 2015-04-14 DIAGNOSIS — E662 Morbid (severe) obesity with alveolar hypoventilation: Secondary | ICD-10-CM | POA: Diagnosis present

## 2015-04-14 DIAGNOSIS — R739 Hyperglycemia, unspecified: Secondary | ICD-10-CM | POA: Diagnosis present

## 2015-04-14 DIAGNOSIS — E875 Hyperkalemia: Secondary | ICD-10-CM | POA: Diagnosis present

## 2015-04-14 DIAGNOSIS — K761 Chronic passive congestion of liver: Secondary | ICD-10-CM | POA: Diagnosis present

## 2015-04-14 DIAGNOSIS — I5033 Acute on chronic diastolic (congestive) heart failure: Secondary | ICD-10-CM

## 2015-04-14 DIAGNOSIS — Z87891 Personal history of nicotine dependence: Secondary | ICD-10-CM | POA: Diagnosis not present

## 2015-04-14 DIAGNOSIS — I110 Hypertensive heart disease with heart failure: Principal | ICD-10-CM | POA: Diagnosis present

## 2015-04-14 DIAGNOSIS — J9690 Respiratory failure, unspecified, unspecified whether with hypoxia or hypercapnia: Secondary | ICD-10-CM

## 2015-04-14 DIAGNOSIS — G4733 Obstructive sleep apnea (adult) (pediatric): Secondary | ICD-10-CM | POA: Diagnosis present

## 2015-04-14 DIAGNOSIS — Z6841 Body Mass Index (BMI) 40.0 and over, adult: Secondary | ICD-10-CM | POA: Diagnosis not present

## 2015-04-14 DIAGNOSIS — I5032 Chronic diastolic (congestive) heart failure: Secondary | ICD-10-CM | POA: Diagnosis present

## 2015-04-14 DIAGNOSIS — Z789 Other specified health status: Secondary | ICD-10-CM | POA: Insufficient documentation

## 2015-04-14 DIAGNOSIS — E876 Hypokalemia: Secondary | ICD-10-CM | POA: Diagnosis present

## 2015-04-14 DIAGNOSIS — Z8249 Family history of ischemic heart disease and other diseases of the circulatory system: Secondary | ICD-10-CM | POA: Diagnosis not present

## 2015-04-14 DIAGNOSIS — I2781 Cor pulmonale (chronic): Secondary | ICD-10-CM | POA: Diagnosis present

## 2015-04-14 DIAGNOSIS — G9340 Encephalopathy, unspecified: Secondary | ICD-10-CM | POA: Diagnosis not present

## 2015-04-14 DIAGNOSIS — J9621 Acute and chronic respiratory failure with hypoxia: Secondary | ICD-10-CM | POA: Diagnosis present

## 2015-04-14 DIAGNOSIS — J811 Chronic pulmonary edema: Secondary | ICD-10-CM

## 2015-04-14 DIAGNOSIS — I10 Essential (primary) hypertension: Secondary | ICD-10-CM | POA: Diagnosis present

## 2015-04-14 DIAGNOSIS — R42 Dizziness and giddiness: Secondary | ICD-10-CM | POA: Diagnosis present

## 2015-04-14 LAB — BASIC METABOLIC PANEL
ANION GAP: 5 (ref 5–15)
BUN: 19 mg/dL (ref 6–20)
CALCIUM: 8.2 mg/dL — AB (ref 8.9–10.3)
CO2: 42 mmol/L — AB (ref 22–32)
CREATININE: 1.16 mg/dL (ref 0.61–1.24)
Chloride: 89 mmol/L — ABNORMAL LOW (ref 101–111)
GFR calc Af Amer: >60 mL/min (ref >60)
GLUCOSE: 134 mg/dL — AB (ref 65–99)
Potassium: 5.3 mmol/L — ABNORMAL HIGH (ref 3.5–5.1)
Sodium: 136 mmol/L (ref 135–145)

## 2015-04-14 LAB — COMPREHENSIVE METABOLIC PANEL
ALT: 21 U/L (ref 17–63)
ANION GAP: 10 (ref 5–15)
AST: 31 U/L (ref 15–41)
Albumin: 2.8 g/dL — ABNORMAL LOW (ref 3.5–5.0)
Alkaline Phosphatase: 52 U/L (ref 38–126)
BUN: 17 mg/dL (ref 6–20)
CO2: 42 mmol/L — ABNORMAL HIGH (ref 22–32)
Calcium: 8.5 mg/dL — ABNORMAL LOW (ref 8.9–10.3)
Chloride: 86 mmol/L — ABNORMAL LOW (ref 101–111)
Creatinine, Ser: 1.12 mg/dL (ref 0.61–1.24)
Glucose, Bld: 111 mg/dL — ABNORMAL HIGH (ref 65–99)
POTASSIUM: 3.8 mmol/L (ref 3.5–5.1)
SODIUM: 138 mmol/L (ref 135–145)
TOTAL PROTEIN: 5.7 g/dL — AB (ref 6.5–8.1)
Total Bilirubin: 2 mg/dL — ABNORMAL HIGH (ref 0.3–1.2)

## 2015-04-14 LAB — URINALYSIS, ROUTINE W REFLEX MICROSCOPIC
Glucose, UA: NEGATIVE mg/dL
KETONES UR: NEGATIVE mg/dL
Leukocytes, UA: NEGATIVE
Nitrite: NEGATIVE
Protein, ur: >300 mg/dL — AB
SPECIFIC GRAVITY, URINE: 1.025 (ref 1.005–1.030)
pH: 5.5 (ref 5.0–8.0)

## 2015-04-14 LAB — BLOOD GAS, ARTERIAL
ACID-BASE EXCESS: 11.4 mmol/L — AB (ref 0.0–2.0)
ACID-BASE EXCESS: 14.7 mmol/L — AB (ref 0.0–2.0)
BICARBONATE: 42.7 meq/L — AB (ref 20.0–24.0)
Bicarbonate: 42.6 mEq/L — ABNORMAL HIGH (ref 20.0–24.0)
DELIVERY SYSTEMS: POSITIVE
DRAWN BY: 441261
Drawn by: 232811
Drawn by: 103701
EXPIRATORY PAP: 8
FIO2: 1
FIO2: 1
INSPIRATORY PAP: 20
LHR: 14 {breaths}/min
O2 CONTENT: 5 L/min
O2 SAT: 94.5 %
O2 SAT: 98 %
O2 SAT: 99.2 %
PATIENT TEMPERATURE: 98.6
PEEP/CPAP: 12 cmH2O
PH ART: 7.156 — AB (ref 7.350–7.450)
Patient temperature: 98.3
Patient temperature: 98.6
RATE: 20 resp/min
TCO2: 36.5 mmol/L (ref 0–100)
TCO2: 34.9 mmol/L (ref 0–100)
VT: 530 mL
pCO2 arterial: 84.5 mmHg (ref 35.0–45.0)
pCO2 arterial: 61.3 mmHg (ref 35.0–45.0)
pH, Arterial: 7.322 — ABNORMAL LOW (ref 7.350–7.450)
pH, Arterial: 7.457 — ABNORMAL HIGH (ref 7.350–7.450)
pO2, Arterial: 84.3 mmHg (ref 80.0–100.0)
pO2, Arterial: 152 mmHg — ABNORMAL HIGH (ref 80.0–100.0)
pO2, Arterial: 146 mmHg — ABNORMAL HIGH (ref 80.0–100.0)

## 2015-04-14 LAB — INFLUENZA PANEL BY PCR (TYPE A & B)
H1N1FLUPCR: NOT DETECTED
Influenza A By PCR: NEGATIVE
Influenza B By PCR: NEGATIVE

## 2015-04-14 LAB — RAPID URINE DRUG SCREEN, HOSP PERFORMED
AMPHETAMINES: NOT DETECTED
BARBITURATES: NOT DETECTED
BENZODIAZEPINES: NOT DETECTED
COCAINE: NOT DETECTED
OPIATES: NOT DETECTED
TETRAHYDROCANNABINOL: NOT DETECTED

## 2015-04-14 LAB — CBC
HCT: 64.6 % — ABNORMAL HIGH (ref 39.0–52.0)
HEMATOCRIT: 61.5 % — AB (ref 39.0–52.0)
HEMOGLOBIN: 17.1 g/dL — AB (ref 13.0–17.0)
Hemoglobin: 17.8 g/dL — ABNORMAL HIGH (ref 13.0–17.0)
MCH: 25.6 pg — AB (ref 26.0–34.0)
MCH: 26.3 pg (ref 26.0–34.0)
MCHC: 26.5 g/dL — AB (ref 30.0–36.0)
MCHC: 28.9 g/dL — ABNORMAL LOW (ref 30.0–36.0)
MCV: 96.6 fL (ref 78.0–100.0)
MCV: 90.8 fL (ref 78.0–100.0)
Platelets: 242 10*3/uL (ref 150–400)
Platelets: 193 10*3/uL (ref 150–400)
RBC: 6.69 MIL/uL — ABNORMAL HIGH (ref 4.22–5.81)
RBC: 6.77 MIL/uL — AB (ref 4.22–5.81)
RDW: 17.4 % — AB (ref 11.5–15.5)
RDW: 17 % — AB (ref 11.5–15.5)
WBC: 10.1 10*3/uL (ref 4.0–10.5)
WBC: 18.7 10*3/uL — AB (ref 4.0–10.5)

## 2015-04-14 LAB — MRSA PCR SCREENING: MRSA BY PCR: NEGATIVE

## 2015-04-14 LAB — I-STAT CG4 LACTIC ACID, ED: Lactic Acid, Venous: 1.21 mmol/L (ref 0.5–2.0)

## 2015-04-14 LAB — HEPATIC FUNCTION PANEL
ALT: 15 U/L — AB (ref 17–63)
AST: 39 U/L (ref 15–41)
Albumin: 2.9 g/dL — ABNORMAL LOW (ref 3.5–5.0)
Alkaline Phosphatase: 55 U/L (ref 38–126)
BILIRUBIN DIRECT: 0.5 mg/dL (ref 0.1–0.5)
BILIRUBIN INDIRECT: 0.4 mg/dL (ref 0.3–0.9)
BILIRUBIN TOTAL: 0.9 mg/dL (ref 0.3–1.2)
Total Protein: 5.8 g/dL — ABNORMAL LOW (ref 6.5–8.1)

## 2015-04-14 LAB — STREP PNEUMONIAE URINARY ANTIGEN: STREP PNEUMO URINARY ANTIGEN: NEGATIVE

## 2015-04-14 LAB — TROPONIN I
TROPONIN I: 0.04 ng/mL — AB (ref <0.031)
Troponin I: <0.03 ng/mL (ref <0.031)

## 2015-04-14 LAB — I-STAT TROPONIN, ED: Troponin i, poc: 0 ng/mL (ref 0.00–0.08)

## 2015-04-14 LAB — URINE MICROSCOPIC-ADD ON

## 2015-04-14 LAB — BRAIN NATRIURETIC PEPTIDE
B NATRIURETIC PEPTIDE 5: 203.3 pg/mL — AB (ref 0.0–100.0)
B Natriuretic Peptide: 342.4 pg/mL — ABNORMAL HIGH (ref 0.0–100.0)

## 2015-04-14 LAB — ETHANOL: Alcohol, Ethyl (B): <5 mg/dL (ref <5)

## 2015-04-14 LAB — CBG MONITORING, ED: GLUCOSE-CAPILLARY: 129 mg/dL — AB (ref 65–99)

## 2015-04-14 LAB — AMMONIA: Ammonia: 104 umol/L — ABNORMAL HIGH (ref 9–35)

## 2015-04-14 MED ORDER — SODIUM CHLORIDE 0.9 % IV SOLN
1.5000 g | Freq: Four times a day (QID) | INTRAVENOUS | Status: DC
  Administered 2015-04-14 – 2015-04-16 (×9): 1.5 g via INTRAVENOUS
  Filled 2015-04-14 (×9): qty 1.5

## 2015-04-14 MED ORDER — PANTOPRAZOLE SODIUM 40 MG IV SOLR
40.0000 mg | INTRAVENOUS | Status: DC
  Administered 2015-04-14 – 2015-04-16 (×3): 40 mg via INTRAVENOUS
  Filled 2015-04-14 (×3): qty 40

## 2015-04-14 MED ORDER — DEXTROSE 5 % IV SOLN
500.0000 mg | INTRAVENOUS | Status: DC
  Administered 2015-04-14 – 2015-04-16 (×3): 500 mg via INTRAVENOUS
  Filled 2015-04-14 (×3): qty 500

## 2015-04-14 MED ORDER — ONDANSETRON HCL 4 MG PO TABS: 4.0000 mg | ORAL_TABLET | Freq: Four times a day (QID) | ORAL | Status: DC | PRN

## 2015-04-14 MED ORDER — ACETAMINOPHEN 650 MG RE SUPP: 650.0000 mg | Freq: Four times a day (QID) | RECTAL | Status: DC | PRN

## 2015-04-14 MED ORDER — CARVEDILOL 3.125 MG PO TABS: 3.1250 mg | ORAL_TABLET | Freq: Two times a day (BID) | ORAL | Status: DC

## 2015-04-14 MED ORDER — FENTANYL CITRATE (PF) 100 MCG/2ML IJ SOLN: 50.0000 ug | INTRAMUSCULAR | Status: DC | PRN

## 2015-04-14 MED ORDER — MIDAZOLAM HCL 2 MG/2ML IJ SOLN
INTRAMUSCULAR | Status: AC
  Administered 2015-04-14: 2 mg
  Filled 2015-04-14: qty 2

## 2015-04-14 MED ORDER — ROCURONIUM BROMIDE 50 MG/5ML IV SOLN
1.0000 mg/kg | Freq: Once | INTRAVENOUS | Status: AC
  Administered 2015-04-14: 100 mg via INTRAVENOUS

## 2015-04-14 MED ORDER — SUCCINYLCHOLINE CHLORIDE 20 MG/ML IJ SOLN
150.0000 mg | Freq: Once | INTRAMUSCULAR | Status: AC
  Administered 2015-04-14: 150 mg via INTRAVENOUS
  Filled 2015-04-14: qty 7.5

## 2015-04-14 MED ORDER — SODIUM CHLORIDE 0.9% FLUSH
3.0000 mL | Freq: Two times a day (BID) | INTRAVENOUS | Status: DC
  Administered 2015-04-14 – 2015-04-28 (×27): 3 mL via INTRAVENOUS

## 2015-04-14 MED ORDER — ONDANSETRON HCL 4 MG/2ML IJ SOLN: 4.0000 mg | Freq: Four times a day (QID) | INTRAMUSCULAR | Status: DC | PRN

## 2015-04-14 MED ORDER — FUROSEMIDE 10 MG/ML IJ SOLN: 80.0000 mg | Freq: Two times a day (BID) | INTRAMUSCULAR | Status: DC

## 2015-04-14 MED ORDER — FUROSEMIDE 10 MG/ML IJ SOLN
40.0000 mg | Freq: Every day | INTRAMUSCULAR | Status: DC
  Administered 2015-04-14: 40 mg via INTRAVENOUS
  Administered 2015-04-14: 80 mg via INTRAVENOUS
  Filled 2015-04-14: qty 4

## 2015-04-14 MED ORDER — FUROSEMIDE 40 MG PO TABS: 80.0000 mg | ORAL_TABLET | Freq: Two times a day (BID) | ORAL | Status: DC

## 2015-04-14 MED ORDER — BUDESONIDE 0.5 MG/2ML IN SUSP
0.5000 mg | Freq: Two times a day (BID) | RESPIRATORY_TRACT | Status: DC
  Administered 2015-04-14 – 2015-04-16 (×4): 0.5 mg via RESPIRATORY_TRACT
  Filled 2015-04-14 (×4): qty 2

## 2015-04-14 MED ORDER — ASPIRIN EC 325 MG PO TBEC
325.0000 mg | DELAYED_RELEASE_TABLET | Freq: Every day | ORAL | Status: DC
  Administered 2015-04-14 – 2015-04-28 (×15): 325 mg via ORAL
  Filled 2015-04-14 (×16): qty 1

## 2015-04-14 MED ORDER — ACETAMINOPHEN 325 MG PO TABS
650.0000 mg | ORAL_TABLET | Freq: Four times a day (QID) | ORAL | Status: DC | PRN
  Administered 2015-04-18 – 2015-04-20 (×4): 650 mg via ORAL
  Filled 2015-04-14 (×6): qty 2

## 2015-04-14 MED ORDER — IPRATROPIUM-ALBUTEROL 0.5-2.5 (3) MG/3ML IN SOLN
3.0000 mL | RESPIRATORY_TRACT | Status: DC | PRN
Start: 2015-04-14 — End: 2015-04-16

## 2015-04-14 MED ORDER — FUROSEMIDE 10 MG/ML IJ SOLN
INTRAMUSCULAR | Status: AC
  Filled 2015-04-14: qty 8

## 2015-04-14 MED ORDER — MIDAZOLAM HCL 2 MG/2ML IJ SOLN
1.0000 mg | INTRAMUSCULAR | Status: DC | PRN
  Administered 2015-04-14: 2 mg via INTRAVENOUS
  Filled 2015-04-14: qty 2

## 2015-04-14 MED ORDER — LISINOPRIL 5 MG PO TABS: 5.0000 mg | ORAL_TABLET | Freq: Two times a day (BID) | ORAL | Status: DC

## 2015-04-14 MED ORDER — POTASSIUM CHLORIDE CRYS ER 20 MEQ PO TBCR: 20.0000 meq | EXTENDED_RELEASE_TABLET | Freq: Every day | ORAL | Status: DC

## 2015-04-14 MED ORDER — PROPOFOL 1000 MG/100ML IV EMUL
0.0000 ug/kg/min | INTRAVENOUS | Status: DC
  Administered 2015-04-14: 35 ug/kg/min via INTRAVENOUS
  Administered 2015-04-14: 10 ug/kg/min via INTRAVENOUS
  Administered 2015-04-14: 35 ug/kg/min via INTRAVENOUS
  Administered 2015-04-14: 50 ug/kg/min via INTRAVENOUS
  Administered 2015-04-15: 20 ug/kg/min via INTRAVENOUS
  Administered 2015-04-15: 25 ug/kg/min via INTRAVENOUS
  Administered 2015-04-15: 35 ug/kg/min via INTRAVENOUS
  Administered 2015-04-15 – 2015-04-16 (×3): 20 ug/kg/min via INTRAVENOUS
  Filled 2015-04-14 (×11): qty 100

## 2015-04-14 MED ORDER — LACTULOSE 10 GM/15ML PO SOLN
30.0000 g | Freq: Two times a day (BID) | ORAL | Status: DC
  Administered 2015-04-14 – 2015-04-24 (×10): 30 g
  Filled 2015-04-14 (×12): qty 45

## 2015-04-14 MED ORDER — ETOMIDATE 2 MG/ML IV SOLN
20.0000 mg | Freq: Once | INTRAVENOUS | Status: AC
  Administered 2015-04-14: 20 mg via INTRAVENOUS

## 2015-04-14 MED ORDER — FENTANYL CITRATE (PF) 2500 MCG/50ML IJ SOLN
25.0000 ug/h | INTRAMUSCULAR | Status: DC
  Administered 2015-04-14: 25 ug/h via INTRAVENOUS
  Administered 2015-04-15: 150 ug/h via INTRAVENOUS
  Administered 2015-04-16: 50 ug/h via INTRAVENOUS
  Filled 2015-04-14 (×3): qty 50

## 2015-04-14 MED ORDER — FENTANYL CITRATE (PF) 100 MCG/2ML IJ SOLN
50.0000 ug | INTRAMUSCULAR | Status: DC | PRN
  Administered 2015-04-15: 50 ug via INTRAVENOUS

## 2015-04-14 MED ORDER — IPRATROPIUM-ALBUTEROL 0.5-2.5 (3) MG/3ML IN SOLN
3.0000 mL | Freq: Four times a day (QID) | RESPIRATORY_TRACT | Status: DC
Start: 2015-04-14 — End: 2015-04-16
  Administered 2015-04-14 – 2015-04-16 (×7): 3 mL via RESPIRATORY_TRACT
  Filled 2015-04-14 (×8): qty 3

## 2015-04-14 MED ORDER — SODIUM CHLORIDE 0.9 % IV SOLN
INTRAVENOUS | Status: DC
  Administered 2015-04-14: 11:00:00 via INTRAVENOUS

## 2015-04-14 NOTE — ED Notes (Signed)
Bed: RESA Expected date:  Expected time:  Means of arrival:  Comments: EMS 30 yo male awoke with dizziness, nausea and vomiting, CHF

## 2015-04-14 NOTE — ED Provider Notes (Signed)
09:35- . I was called to the room because the patient's oxygen saturation suddenly dropped "to the 80s". His BiPAP, oxygen supplementation had been recently decreased from 100-80%. The oxygen was increased by the respiratory therapist and his saturation improved to 95%. The patient is currently obtunded. It is unclear how long he is been obtunded. Last blood gas was about 2 hours ago and showed hypercapnia with a fairly normal pH. He has had multiple prior ABGs, indicating hypercapnia. It is unclear how much is acute versus chronic. Will repeat blood gas at this time and consider intubation.  Oxygenation management by adjustment of BiPAP, review of records and evaluation indicated worsening respiratory failure.  10:25- ABG indicates acute hypercapneic respiratory failure. Will intubate.   CRITICAL CARE Performed by: Richarda Blade Total critical care time: 40 minutes Critical care time was exclusive of separately billable procedures and treating other patients. Critical care was necessary to treat or prevent imminent or life-threatening deterioration. Critical care was time spent personally by me on the following activities: development of treatment plan with patient and/or surrogate as well as nursing, discussions with consultants, evaluation of patient's response to treatment, examination of patient, obtaining history from patient or surrogate, ordering and performing treatments and interventions, ordering and review of laboratory studies, ordering and review of radiographic studies, pulse oximetry and re-evaluation of patient's condition.   INTUBATION Performed by: Richarda Blade  Required items: required blood products, implants, devices, and special equipment available Patient identity confirmed: provided demographic data and hospital-assigned identification number Time out: Immediately prior to procedure a "time out" was called to verify the correct patient, procedure, equipment, support  staff and site/side marked as required.  Indications: Hypercapnic respiratory failure   Intubation method: Glidescope Laryngoscopy   Preoxygenation: 100 %BVM  Sedatives: 20 mg Etomidate Paralytic: 150 mg Succinylcholine, and 122.5 mg  Rocuronium  Difficult intubation, for lack of  visualization of cords on initial visualization. Patient was bagged, for about 3 minutes to restore oxygenation. Suctioning performed several times. Clear mucus , with a little bit of blood. On second look, brief attempt with bougie then with good cord visualization the ET tube was placed, by me.   Tube Size: 7.5 cuffed  Post-procedure assessment: chest rise and ETCO2 monitor Breath sounds: L > R and absent over the epigastrium Tube secured with: ETT holder Chest x-ray interpreted by radiologist and me.  Chest x-ray findings: endotracheal tube in appropriate position  Patient tolerated the procedure well with no immediate complications.  Medications  aspirin EC tablet 325 mg (not administered)  sodium chloride flush (NS) 0.9 % injection 3 mL (3 mLs Intravenous Not Given 04/14/15 1124)  0.9 %  sodium chloride infusion ( Intravenous New Bag/Given 04/14/15 1045)  acetaminophen (TYLENOL) tablet 650 mg (not administered)    Or  acetaminophen (TYLENOL) suppository 650 mg (not administered)  ondansetron (ZOFRAN) injection 4 mg (not administered)  ipratropium-albuterol (DUONEB) 0.5-2.5 (3) MG/3ML nebulizer solution 3 mL (not administered)  furosemide (LASIX) injection 40 mg (40 mg Intravenous Given 04/14/15 1138)  propofol (DIPRIVAN) 1000 MG/100ML infusion (not administered)  fentaNYL (SUBLIMAZE) injection 50 mcg (not administered)  fentaNYL (SUBLIMAZE) injection 50 mcg (not administered)  pantoprazole (PROTONIX) injection 40 mg (40 mg Intravenous Given 04/14/15 1140)  etomidate (AMIDATE) injection 20 mg (20 mg Intravenous Given 04/14/15 1045)  succinylcholine (ANECTINE) injection 150 mg (150 mg Intravenous Given  04/14/15 1045)  rocuronium (ZEMURON) injection 122.5 mg (100 mg Intravenous Given 04/14/15 1101)    Patient Vitals for the past 24  hrs:  BP Temp Temp src Pulse Resp SpO2 Weight  04/14/15 1000 137/85 mmHg - - 66 17 100 % -  04/14/15 0945 115/71 mmHg - - 77 14 100 % -  04/14/15 0931 116/74 mmHg - - 84 (!) 28 (!) 86 % -  04/14/15 0915 110/72 mmHg - - 71 (!) 7 98 % -  04/14/15 0900 138/91 mmHg - - 83 18 (!) 88 % -  04/14/15 0845 115/77 mmHg - - 72 14 100 % -  04/14/15 0830 110/68 mmHg - - 72 14 100 % -  04/14/15 0815 118/75 mmHg - - 71 14 100 % -  04/14/15 0800 114/69 mmHg - - - 14 - -  04/14/15 0739 - - - - - - 270 lb (122.471 kg)  04/14/15 0730 107/66 mmHg - - 78 14 100 % -  04/14/15 0717 128/83 mmHg - - 92 15 94 % -  04/14/15 0715 128/83 mmHg - - 93 - (!) 69 % -  04/14/15 0700 117/72 mmHg - - 84 - 91 % -  04/14/15 0645 135/84 mmHg - - 79 16 94 % -  04/14/15 0642 123/87 mmHg - - 80 20 93 % -  04/14/15 0615 133/92 mmHg - - 82 21 91 % -  04/14/15 0543 125/90 mmHg 98.6 F (37 C) Oral 79 17 96 % -    Daleen Bo, MD 04/14/15 1551

## 2015-04-14 NOTE — ED Notes (Addendum)
Hospitalist at bedside 

## 2015-04-14 NOTE — ED Notes (Signed)
Pt BIB EMS. Woke up at 400 experiencing dizziness. Pt was not wearing cpap as prescribed. Nauseous when he got up. Vomited 3-4 times before calling EMS. Vomited 2x with EMS (described as clear liquid). 4mg  zofran IM.  Pt is on continual O2 at home 3L during day, 5L at night. 12 lead unremarkable per EMS. Denies SOB and chest pain.

## 2015-04-14 NOTE — ED Notes (Signed)
Pt's O2 sat dropped into 80s w/ 60% O2.  Pt increased back to 100% O2.

## 2015-04-14 NOTE — Progress Notes (Signed)
[facility] Progress Note Patient Name: Douglas [name]. DOB: 1986/01/19 MRN: [id number]   Date of Service  04/14/2015  HPI/Events of Note  Labs reviewed, camera check. Stable, no interventions at this time.   eICU Interventions          Laverle Hobby 04/14/2015, 7:49 PM

## 2015-04-14 NOTE — H&P (Addendum)
[facility] History and Physical  Douglas D Conklin Jr. [id number] DOB: [birth year] DOA: 04/14/2015  Referring physician: ER physician: Dr. Varney Biles and Dr. Christ Kick PCP: Philis Fendt, MD  Chief Complaint: nausea and dizziness   HPI:  30 -year-old male with past medical history of OHS/OSA, noncompliant with BiPAP, morbid obesity, HTN, diastolic CHF with prominent RV failure. He follows in heart failure clinic. He takes Lasix 80 mg twice daily per cardiology recommendation. He presented to [facility] with reports of feeling dizzy and nauseous and having vomiting earlier this morning. There was no mention of respiratory distress prior to admission. He is not able to provide any history at this point because he is lethargic. He is on BiPAP. No reports of fevers. No reports of diarrhea, blood in the stool, blood in the urine.   In ED, BP was 123/87, HR 82, RR 16-21, oxygen saturation 91% on BiPAP. ABG's initially pH 7.32, pCO2 84.5, pO2 84.3 and after on BiPAP pH 7.16, unable to get pCO2 and pO2 152. Blood owrk showed potassium of 5.3, CO2 42, normal lactic acid, normal Cr. He will be intubated and PCCM informed of pt status and need for vent management.  Assessment & Plan    Principal Problem:   Acute respiratory failure with hypoxia and hypercarbia (HCC) / OSA treated with BiPAP / Cor pulmonale (HCC) - - Likely combination of non compliance with BiPAP, possible CHF decompensation  ABG's initially pH 7.32, pCO2 84.5, pO2 84.3 and after on BiPAP pH 7.16, unable to get pCO2 and pO2 152 - Pt will be intubated - Appreciate PCCM involvement and vent management - Nebulizer treatment per PCCM. Currently order for duoneb every 4 hours as needed   Active Problems:   Acute encephalopathy - likely due to hypercarbia, non compliance with BiPAP, CHF - CT head with no acute intracranial findings  - Urine drug screen is pending, alcohol level is pending, ammonia level is  pending    Chronic diastolic CHF (congestive heart failure), NYHA class 3 ([place]) - Appreciate Cardiology input  - Started lasix 40 mg IV daily. His weight is 270 lbs, just yesterday in HF clinic reported weight by pt 264, 265 lbs - CXR showed vascular congestion and marked cardiomegaly  - Continue strict intake and output and daily weight - Currently hyperkalemic which will likely get better with Lasix IV.    Essential hypertension - BP stable, 137/85 - PO BP meds on hold as pt will be intubated     Hyperkalemia - Likely due to supplementation  - Started lasix 40 mg IV so will likely resolve with IV lasix     DVT prophylaxis:  - SCD's bilaterally   Radiological Exams on Admission: Ct Head Wo Contrast 04/14/2015  Small maxillary sinus retention cysts. Essentially aplastic mastoid air cells bilaterally. No intracranial mass, hemorrhage, or focal gray -white compartment lesion. Electronically Signed   By: Lowella Grip III M.D.   On: 04/14/2015 07:27   Dg Chest Portable 1 View 04/14/2015  Vascular congestion and marked cardiomegaly. Findings are similar to the 09/13/2014 examination. Electronically Signed   By: Andreas Newport M.D.   On: 04/14/2015 06:05    EKG: I have personally reviewed EKG. EKG shows sinus rhythm, left atrial enlargement   Code Status: Full Family Communication: Plan of care discussed with the patient  Disposition Plan: Admit for further evaluation, SDU, requires BiPAP  Leisa Lenz, MD  [facility] Pager [phone]  Time spent in minutes:  75 minutes  Review of Systems:  Pt lethargic, unable to obtain ROS  Past Medical History  Diagnosis Date  . Hypertension   . Obesity   . Tobacco abuse   . Substance abuse 8/31/213    cociane "first time use"  . High cholesterol   . CHF (congestive heart failure)   . Chronic bronchitis   . OSA on CPAP   . Arthritis     "hands, feet" (10/31/2013)  . Pneumonia 09/2011   Past Surgical History  Procedure  Laterality Date  . No past surgeries    . Tubes in ears     Social History:  reports that he quit smoking about 17 months ago. His smoking use included Cigarettes. He has a 4.5 pack-year smoking history. He has never used smokeless tobacco. He reports that he uses illicit drugs (Cocaine). He reports that he does not drink alcohol.  No Known Allergies  Family History:  Family History  Problem Relation Age of Onset  . Heart disease Mother     Pacemaker  . Heart attack Neg Hx   . Stroke Neg Hx   . Hypertension Father   . Cancer Paternal Grandfather      Prior to Admission medications   Medication Sig Start Date End Date Taking? Authorizing Provider  albuterol (PROVENTIL HFA;VENTOLIN HFA) 108 (90 BASE) MCG/ACT inhaler Inhale 1-2 puffs into the lungs every 6 (six) hours as needed for wheezing or shortness of breath. 11/05/13   Shanker Kristeen Mans, MD  aspirin 325 MG EC tablet Take 1 tablet (325 mg total) by mouth daily. 09/21/14   Nishant Dhungel, MD  carvedilol (COREG) 3.125 MG tablet Take 1 tablet (3.125 mg total) by mouth 2 (two) times daily with a meal. 10/10/14   Lelon Perla, MD  furosemide (LASIX) 40 MG tablet Take 2 tablets (80 mg total) by mouth 2 (two) times daily. 12/30/14   [name] D Clegg, NP  lisinopril (PRINIVIL,ZESTRIL) 5 MG tablet Take 1 tablet (5 mg total) by mouth 2 (two) times daily. 10/30/14   [name] D Clegg, NP  potassium chloride SA (K-DUR,KLOR-CON) 20 MEQ tablet Take 1 tablet (20 mEq total) by mouth daily. 09/21/14   Louellen Molder, MD   Physical Exam: Filed Vitals:   04/14/15 0700 04/14/15 0715 04/14/15 0717 04/14/15 0739  BP: 117/72 128/83 128/83   Pulse: 84 93 92   Temp:      TempSrc:      Resp:   15   Weight:    122.471 kg (270 lb)  SpO2: 91% 69% 94%     Physical Exam  Constitutional: Morbidly obese. No distress. On BiPAP HENT: Normocephalic. No tonsillar erythema or exudates Eyes: Conjunctivae are normal. No scleral icterus.  Neck: Normal ROM. No JVD. No  tracheal deviation. No thyromegaly.  CVS: tachycardic, S1/S2 appreciated.  Pulmonary: diminished, on BiPAP, no wheezing  Abdominal: Obese, Soft. BS +,  no distension, tenderness, rebound or guarding.  Musculoskeletal: Unable to test due to patient's altered mental status. No cyanosis and no tenderness.  Lymphadenopathy: No lymphadenopathy noted, cervical, inguinal. Neuro: Lethargic. No focal neurologic deficits. Skin: Skin is warm and dry. No rash noted.  No erythema. No pallor.  Psychiatric: Unable to test due to altered mental status   Labs on Admission:  Basic Metabolic Panel:  Recent Labs Lab 04/14/15 0608  NA 136  K 5.3*  CL 89*  CO2 42*  GLUCOSE 134*  BUN 19  CREATININE 1.16  CALCIUM 8.2*   Liver  Function Tests:  Recent Labs Lab 04/14/15 0608  AST 39  ALT 15*  ALKPHOS 55  BILITOT 0.9  PROT 5.8*  ALBUMIN 2.9*   No results for input(s): LIPASE, AMYLASE in the last 168 hours. No results for input(s): AMMONIA in the last 168 hours. CBC:  Recent Labs Lab 04/14/15 0608  WBC 10.1  HGB 17.1*  HCT 64.6*  MCV 96.6  PLT 242   Cardiac Enzymes: No results for input(s): CKTOTAL, CKMB, CKMBINDEX, TROPONINI in the last 168 hours. BNP: Invalid input(s): POCBNP CBG:  Recent Labs Lab 04/14/15 0609  GLUCAP 129*    If 7PM-7AM, please contact night-coverage [email or web address] Password [facility] 04/14/2015, 7:49 AM

## 2015-04-14 NOTE — ED Provider Notes (Signed)
CSN: [id number]     Arrival date & time 04/14/15  [id number] History   First MD Initiated Contact with Patient 04/14/15 [phone]     Chief Complaint  Patient presents with  . Emesis  . Dizziness     (Consider location/radiation/quality/duration/timing/severity/associated sxs/prior Treatment) HPI Comments: 30 -year-old male with past medical history of OHS/OSA, noncompliant with BiPAP, morbid obesity, HTN, diastolic CHF with prominent RV failure. Pt is a poor historian. Pt presented to the ER feeling dizzy and nauseous and having vomiting earlier this morning. He is unable to explain what he means by dizziness (lightheaded vs. Vertigo). He reports nausea and emesis associated with dizziness. Pt has no headache. Pt has no numbness, tingling. Pt is somnolent. Admits to not using his bipap. He has mild DIB. Also, pt is noted to be diophoretic.   ROS 10 Systems reviewed and are negative for acute change except as noted in the HPI.     Patient is a 30 y.o. male presenting with vomiting and dizziness. The history is provided by the patient.  Emesis Dizziness Associated symptoms: vomiting     Past Medical History  Diagnosis Date  . Hypertension   . Obesity   . Tobacco abuse   . Substance abuse 8/31/213    cociane "first time use"  . High cholesterol   . CHF (congestive heart failure)   . Chronic bronchitis   . OSA on CPAP   . Arthritis     "hands, feet" (10/31/2013)  . Pneumonia 09/2011   Past Surgical History  Procedure Laterality Date  . No past surgeries    . Tubes in ears     Family History  Problem Relation Age of Onset  . Heart disease Mother     Pacemaker  . Heart attack Neg Hx   . Stroke Neg Hx   . Hypertension Father   . Cancer Paternal Grandfather    Social History  Substance Use Topics  . Smoking status: Former Smoker -- 0.50 packs/day for 9 years    Types: Cigarettes    Quit date: 10/31/2013  . Smokeless tobacco: Never Used  . Alcohol Use: No    Review of  Systems  Gastrointestinal: Positive for vomiting.  Neurological: Positive for dizziness.      Allergies  Review of patient's allergies indicates no known allergies.  Home Medications   Prior to Admission medications   Medication Sig Start Date End Date Taking? Authorizing Provider  albuterol (PROVENTIL HFA;VENTOLIN HFA) 108 (90 BASE) MCG/ACT inhaler Inhale 1-2 puffs into the lungs every 6 (six) hours as needed for wheezing or shortness of breath. 11/05/13   Shanker Kristeen Mans, MD  aspirin 325 MG EC tablet Take 1 tablet (325 mg total) by mouth daily. 09/21/14   Nishant Dhungel, MD  carvedilol (COREG) 3.125 MG tablet Take 1 tablet (3.125 mg total) by mouth 2 (two) times daily with a meal. 10/10/14   Lelon Perla, MD  furosemide (LASIX) 40 MG tablet Take 2 tablets (80 mg total) by mouth 2 (two) times daily. 12/30/14   [name] D Clegg, NP  lisinopril (PRINIVIL,ZESTRIL) 5 MG tablet Take 1 tablet (5 mg total) by mouth 2 (two) times daily. 10/30/14   [name] D Clegg, NP  potassium chloride SA (K-DUR,KLOR-CON) 20 MEQ tablet Take 1 tablet (20 mEq total) by mouth daily. 09/21/14   Nishant Dhungel, MD   BP 135/84 mmHg  Pulse 79  Temp(Src) 98.6 F (37 C) (Oral)  Resp 16  SpO2 94% Physical Exam  Constitutional: He is oriented to person, place, and time. He appears well-developed.  HENT:  Head: Atraumatic.  Neck: Neck supple.  Cardiovascular: Normal rate.   Pulmonary/Chest: Effort normal. No respiratory distress. He has no wheezes. He has rales.  Abdominal: Soft. There is no tenderness.  Neurological: He is oriented to person, place, and time. No cranial nerve deficit. Coordination normal.  somnolent  Skin: Skin is warm. He is diaphoretic.  Nursing note and vitals reviewed.   ED Course  .Critical Care Performed by: Varney Biles Authorized by: Varney Biles Total critical care time: 40 minutes Critical care time was exclusive of separately billable procedures and treating other  patients. Critical care was necessary to treat or prevent imminent or life-threatening deterioration of the following conditions: respiratory failure. Critical care was time spent personally by me on the following activities: discussions with consultants, examination of patient, evaluation of patient's response to treatment, development of treatment plan with patient or surrogate, obtaining history from patient or surrogate, ordering and performing treatments and interventions, ordering and review of laboratory studies, pulse oximetry, ordering and review of radiographic studies, re-evaluation of patient's condition, review of old charts and ventilator management. Subsequent provider of critical care: I assumed direction of critical care for this patient from another provider of my specialty.   (including critical care time) Labs Review Labs Reviewed  BASIC METABOLIC PANEL - Abnormal; Notable for the following:    Potassium 5.3 (*)    Chloride 89 (*)    CO2 42 (*)    Glucose, Bld 134 (*)    Calcium 8.2 (*)    All other components within normal limits  CBC - Abnormal; Notable for the following:    RBC 6.69 (*)    Hemoglobin 17.1 (*)    HCT 64.6 (*)    MCH 25.6 (*)    MCHC 26.5 (*)    RDW 17.4 (*)    All other components within normal limits  BRAIN NATRIURETIC PEPTIDE - Abnormal; Notable for the following:    B Natriuretic Peptide 342.4 (*)    All other components within normal limits  HEPATIC FUNCTION PANEL - Abnormal; Notable for the following:    Total Protein 5.8 (*)    Albumin 2.9 (*)    ALT 15 (*)    All other components within normal limits  BLOOD GAS, ARTERIAL - Abnormal; Notable for the following:    pH, Arterial 7.322 (*)    pCO2 arterial 84.5 (*)    Bicarbonate 42.6 (*)    Acid-Base Excess 11.4 (*)    All other components within normal limits  CBG MONITORING, ED - Abnormal; Notable for the following:    Glucose-Capillary 129 (*)    All other components within normal  limits  CULTURE, BLOOD (SINGLE)  URINALYSIS, ROUTINE W REFLEX MICROSCOPIC (NOT AT [facility])  I-STAT CG4 LACTIC ACID, ED  Randolm Idol, ED    Imaging Review Dg Chest Portable 1 View  04/14/2015  CLINICAL DATA:  Awakened at 04:00 with nausea and dyspnea. EXAM: PORTABLE CHEST 1 VIEW COMPARISON:  09/13/2014 FINDINGS: There is marked unchanged cardiomegaly. There is moderate vascular and interstitial prominence. No alveolar consolidation. No large effusion. IMPRESSION: Vascular congestion and marked cardiomegaly. Findings are similar to the 09/13/2014 examination. Electronically Signed   By: Andreas Newport M.D.   On: 04/14/2015 06:05   I have personally reviewed and evaluated these images and lab results as part of my medical decision-making.   EKG Interpretation   Date/Time:  Tuesday April 14 2015 05:37:07 EDT Ventricular Rate:  78 PR Interval:  178 QRS Duration: 82 QT Interval:  391 QTC Calculation: 445 R Axis:   175 Text Interpretation:  Sinus rhythm Probable left atrial enlargement Right  ventricular hypertrophy Nonspecific T wave abnormality No significant  change since last tracing Confirmed by Kathrynn Humble, MD, Thelma Comp [phone]) on  04/14/2015 6:02:10 AM      MDM   Final diagnoses:  Acute on chronic diastolic congestive heart failure (HCC)  Acute hypercapnic respiratory failure (HCC)    Pt comes in with cc of dizziness, nausea and is noted to be confused, diophoretic.  DDx includes: ICH Stroke ACS Sepsis syndrome Infection - UTI/Pneumonia Electrolyte abnormality Drug overdose Hypercapnia Hypoxia   - CT head - r/o stroke, bleed. Might need MRI - ABG to evaluate for hypercapnia - CXR to eval for pulm edema/pleural effusion. - Basic labs ordered. EKg is reassuring currently. Not sure what to make of the diaphoresis at this time.  Clinical impression - CHF exacerbation and hypercapnia due to bipap. R/o stroke Keep a lose eye for cardiogenic shock due to pulm HTN. PE  to be considered as well if not getting better/ deteriorating.         Varney Biles, MD 04/15/15 [phone]

## 2015-04-14 NOTE — Progress Notes (Signed)
Pt transported on vent with 100% Fio2 to ICU from ED. Pt also sx well prior to transport.

## 2015-04-14 NOTE — Procedures (Signed)
Central Venous Catheter Insertion Procedure Note Douglas [name] [id number] January 04, 1986  Procedure: Insertion of Central Venous Catheter Indications: Assessment of intravascular volume, Drug and/or fluid administration and Frequent blood sampling  Procedure Details Consent: Risks of procedure as well as the alternatives and risks of each were explained to the (patient/caregiver).  Consent for procedure obtained. Time Out: Verified patient identification, verified procedure, site/side was marked, verified correct patient position, special equipment/implants available, medications/allergies/relevent history reviewed, required imaging and test results available.  Performed  real time [place] was used to ID and cannulate the vessel   Maximum sterile technique was used including antiseptics, cap, gloves, gown, hand hygiene, mask and sheet. Skin prep: Chlorhexidine; local anesthetic administered A antimicrobial bonded/coated triple lumen catheter was placed in the left internal jugular vein using the Seldinger technique.  Evaluation Blood flow good Complications: No apparent complications Patient did tolerate procedure well. Chest X-ray ordered to verify placement.  CXR: pending.  [name] 04/14/2015, 4:14 PM

## 2015-04-14 NOTE — ED Notes (Addendum)
Charlies Silvers MD notified of Pt's deterioration.  Gave verbal for In and Out cath. No other orders at this time.  Pt to remain Stepdown admission.

## 2015-04-14 NOTE — Consult Note (Signed)
PULMONARY / CRITICAL CARE MEDICINE   Name: Douglas [name]. MRN: [id number] DOB: Mar 17, 1985    ADMISSION DATE:  04/14/2015 CONSULTATION DATE:  3/21  REFERRING MD:  Charlies Silvers   CHIEF COMPLAINT:  Acute on chronic resp failure   HISTORY OF PRESENT ILLNESS:   This is a 30 year old male w/ chronic resp failure in setting of severe OSA/OHS. Has h/o BIPAP non-compliance. Followed at the heart failure clinic for severe secondary PAH, Cor Pulmonale & diastolic dysfxn. Marland Kitchen His father who is at bedside reports BIPAP not been used in about a month & that he had been more somnolent than usual. Also noted some confusion on 3/20. He awoke nauseated and dizzy at 0400 on 3/21. Called EMS who reported he was not using his BIPAP. At 0400 on 3/21 he awoke feeling dizzy and nauseated. Vomited 3-4 times. Called EMS. On arrival was more lethargic. Placed on NIPPV. In spite of attempts w/ NIPPV he became progressively lethargic and hypercarbic (as well as hypoxic). His PO2 dropped in 60s and PCO2 was to high to evaluate. Because of this he was intubated & PCCM asked to assume care.   PAST MEDICAL HISTORY :  He  has a past medical history of Hypertension; Obesity; Tobacco abuse; Substance abuse (8/31/213); High cholesterol; CHF (congestive heart failure); Chronic bronchitis; OSA on CPAP; Arthritis; and Pneumonia (09/2011).  PAST SURGICAL HISTORY: He  has past surgical history that includes No past surgeries and TUBES IN EARS.  No Known Allergies  No current facility-administered medications on file prior to encounter.   Current Outpatient Prescriptions on File Prior to Encounter  Medication Sig  . albuterol (PROVENTIL HFA;VENTOLIN HFA) 108 (90 BASE) MCG/ACT inhaler Inhale 1-2 puffs into the lungs every 6 (six) hours as needed for wheezing or shortness of breath.  Marland Kitchen aspirin 325 MG EC tablet Take 1 tablet (325 mg total) by mouth daily.  . carvedilol (COREG) 3.125 MG tablet Take 1 tablet (3.125 mg total) by mouth 2  (two) times daily with a meal.  . furosemide (LASIX) 40 MG tablet Take 2 tablets (80 mg total) by mouth 2 (two) times daily.  Marland Kitchen lisinopril (PRINIVIL,ZESTRIL) 5 MG tablet Take 1 tablet (5 mg total) by mouth 2 (two) times daily.  . potassium chloride SA (K-DUR,KLOR-CON) 20 MEQ tablet Take 1 tablet (20 mEq total) by mouth daily.    FAMILY HISTORY:  His indicated that his mother is alive. He indicated that his father is alive.   SOCIAL HISTORY: He  reports that he quit smoking about 17 months ago. His smoking use included Cigarettes. He has a 4.5 pack-year smoking history. He has never used smokeless tobacco. He reports that he uses illicit drugs (Cocaine). He reports that he does not drink alcohol.  REVIEW OF SYSTEMS:   Unable   SUBJECTIVE:  Critically ill on vent   VITAL SIGNS: BP 137/85 mmHg  Pulse 66  Temp(Src) 98.6 F (37 C) (Oral)  Resp 17  Wt 270 lb (122.471 kg)  SpO2 100%  HEMODYNAMICS:    VENTILATOR SETTINGS: Vent Mode:  [-]  FiO2 (%):  [60 %-100 %] 100 %  INTAKE / OUTPUT:    PHYSICAL EXAMINATION: General:  Obese aam sedated now on vent.  Neuro:  Unresponsive prior to intubation. On arrival did not have focal motor def  HEENT:  Neck is large and short. No JVD. MMM PERRL  Cardiovascular:  Rrr, no MRG  Lungs:  Exp wheeze. Decreased on right s/p intubation. Scattered rhonchi  Abdomen:  Obese, no OM, + bowel sounds Musculoskeletal:  Equal st and bulk  Skin:  Warm and dry, LE chronic venous changes.   LABS:  BMET  Recent Labs Lab 04/14/15 0608  NA 136  K 5.3*  CL 89*  CO2 42*  BUN 19  CREATININE 1.16  GLUCOSE 134*    Electrolytes  Recent Labs Lab 04/14/15 0608  CALCIUM 8.2*    CBC  Recent Labs Lab 04/14/15 0608  WBC 10.1  HGB 17.1*  HCT 64.6*  PLT 242    Coag's No results for input(s): APTT, INR in the last 168 hours.  Sepsis Markers  Recent Labs Lab 04/14/15 0619  LATICACIDVEN 1.21    ABG  Recent Labs Lab 04/14/15 0620  04/14/15 1000  PHART 7.322* 7.156*  PCO2ART 84.5* ABOVE REPORTABLE RANGE.RBV  PO2ART 84.3 152*    Liver Enzymes  Recent Labs Lab 04/14/15 0608  AST 39  ALT 15*  ALKPHOS 55  BILITOT 0.9  ALBUMIN 2.9*    Cardiac Enzymes No results for input(s): TROPONINI, PROBNP in the last 168 hours.  Glucose  Recent Labs Lab 04/14/15 0609  GLUCAP 129*    Imaging Ct Head Wo Contrast  04/14/2015  CLINICAL DATA:  Confusion and dizziness EXAM: CT HEAD WITHOUT CONTRAST TECHNIQUE: Contiguous axial images were obtained from the base of the skull through the vertex without intravenous contrast. COMPARISON:  None. FINDINGS: The ventricles are normal in size and configuration. There is no intracranial mass, hemorrhage, extra-axial fluid collection, or midline shift. Gray-white compartments appear normal. No acute infarct evident. The bony calvarium appears intact. Mastoid air cells are essentially aplastic bilaterally. No intraorbital lesions are evident. There are small retention cysts in the inferior right and left maxillary antra. IMPRESSION: Small maxillary sinus retention cysts. Essentially aplastic mastoid air cells bilaterally. No intracranial mass, hemorrhage, or focal gray -white compartment lesion. Electronically Signed   By: Lowella Grip III M.D.   On: 04/14/2015 07:27   Dg Chest Portable 1 View  04/14/2015  CLINICAL DATA:  Awakened at 04:00 with nausea and dyspnea. EXAM: PORTABLE CHEST 1 VIEW COMPARISON:  09/13/2014 FINDINGS: There is marked unchanged cardiomegaly. There is moderate vascular and interstitial prominence. No alveolar consolidation. No large effusion. IMPRESSION: Vascular congestion and marked cardiomegaly. Findings are similar to the 09/13/2014 examination. Electronically Signed   By: Andreas Newport M.D.   On: 04/14/2015 06:05     STUDIES:    CULTURES: Influenza PCR 3/21>>> Sputum culture 3/21>>> BC x 2 3/21>>>  ANTIBIOTICS: unasyn 3/31>>> azithro  3/21>>>  SIGNIFICANT EVENTS:   LINES/TUBES: OETT 3/21 (difficulty airway)>>>  DISCUSSION: This is a 30 year old male w/ sig h/o chronic resp failure in setting of severe OSA/OHS and associated secondary PAH, cor pulmonale and diastolic HF. His father who is at bedside reports BIPAP not been used in about a month & that he had been more somnolent than usual. Also noted some confusion on 3/20. He awoke nauseated and dizzy at 0400 on 3/21. Called EMS who reported he was not using his BIPAP. Became progressively lethargic and hypercarbic requiring intubation. This is most likely decompensated OHS/OSA/cor pulmonale w/ decompensated diastolic dysfxn, but W/ nausea and dizziness need to consider: possible viral process w/ subsequent aspiration, CAP, or atypical cardiac ischemia presentation. Will admit to ICU, pan culture, start empiric abx to cover for both aspiration as well as CAP. Will cont Diuresis as BP/BUN and creatinine allow.   ASSESSMENT / PLAN:  PULMONARY A: Acute on Chronic  Hypercarbic and Hypoxic respiratory failure H/o  OSA/OHS and cor pulmonale. Possible PNA DIFFICULT AIRWAY  BIPAP no-compliance  ->his presentation is not typical of decompensated OHS/OSA (was actually doing better the day before). P:   Full vent support  Adjust Peep/FIO2 PAD protocol  Sputum culture  See ID section  Cycle CEs and ck BNP   CARDIOVASCULAR A:  Secondary PAH Cor pulmonale Diastolic Heart Failure P:  Lasix as BP/UN and creatinine allow  BP control (goal <160) Negative volume status goal   Cycle CEs  RENAL A:   Mild hyperkalemia  P:   F/u chemistry  Strict I&O Neg volume status goal   GASTROINTESTINAL A:   Obesity  Nausea/vomiting Elevated ammonia ? Hepatic congestion  P:   PPI for SUP tubefeeds if requires ventilation > 24hrs  Ck Lipase Ck LFTs  HEMATOLOGIC A:   Polycythemia in setting of chronic hypoxia  P:  [place] heparin  Trend CBC   INFECTIOUS A:   RLL PNA vs  atx P:   Ck influenza panel  PCT algo  Sputum culture  BCX2 Start empiric coverage for aspiration and CAP  ENDOCRINE A:   Mild Hyperglycemia  P:   Trend glucose, start SSI if > 150 on repeat checks  NEUROLOGIC A:   Acute hypercarbic Encephalopathy.  P:   RASS goal: -2 PAD protocol  Lactulose for hyperammonemia   FAMILY  - Updates: pending   - Inter-disciplinary family meet or Palliative Care meeting due by: 3/28    Erick Colace ACNP-BC [place] Pager # [phone] OR # [phone] if no answer  04/14/2015, 11:09 AM

## 2015-04-14 NOTE — Progress Notes (Signed)
Pt transported from ED to CT and back on bipap 40% fio2.  Pt tolerated well without incident.  RT will continue to monitor pt.

## 2015-04-15 DIAGNOSIS — J9602 Acute respiratory failure with hypercapnia: Secondary | ICD-10-CM | POA: Insufficient documentation

## 2015-04-15 LAB — GLUCOSE, CAPILLARY
GLUCOSE-CAPILLARY: 103 mg/dL — AB (ref 65–99)
Glucose-Capillary: 86 mg/dL (ref 65–99)
Glucose-Capillary: 105 mg/dL — ABNORMAL HIGH (ref 65–99)
Glucose-Capillary: 97 mg/dL (ref 65–99)
Glucose-Capillary: 115 mg/dL — ABNORMAL HIGH (ref 65–99)

## 2015-04-15 LAB — BLOOD GAS, ARTERIAL
ACID-BASE EXCESS: 18.7 mmol/L — AB (ref 0.0–2.0)
Bicarbonate: 44.8 mEq/L — ABNORMAL HIGH (ref 20.0–24.0)
Drawn by: 11249
FIO2: 0.7
MECHVT: 530 mL
O2 Saturation: 99.5 %
PATIENT TEMPERATURE: 99.4
PCO2 ART: 49.2 mmHg — AB (ref 35.0–45.0)
PEEP: 12 cmH2O
PO2 ART: 158 mmHg — AB (ref 80.0–100.0)
RATE: 20 resp/min
TCO2: 36 mmol/L (ref 0–100)
pH, Arterial: 7.569 — ABNORMAL HIGH (ref 7.350–7.450)

## 2015-04-15 LAB — BASIC METABOLIC PANEL
Anion gap: 8 (ref 5–15)
BUN: 17 mg/dL (ref 6–20)
CALCIUM: 8.4 mg/dL — AB (ref 8.9–10.3)
CHLORIDE: 89 mmol/L — AB (ref 101–111)
CO2: 42 mmol/L — AB (ref 22–32)
CREATININE: 1.47 mg/dL — AB (ref 0.61–1.24)
GFR calc Af Amer: >60 mL/min (ref >60)
GFR calc non Af Amer: >60 mL/min (ref >60)
GLUCOSE: 88 mg/dL (ref 65–99)
Potassium: 4.4 mmol/L (ref 3.5–5.1)
Sodium: 139 mmol/L (ref 135–145)

## 2015-04-15 LAB — CBC
HCT: 62.5 % — ABNORMAL HIGH (ref 39.0–52.0)
HEMOGLOBIN: 17.4 g/dL — AB (ref 13.0–17.0)
MCH: 26 pg (ref 26.0–34.0)
MCHC: 27.8 g/dL — AB (ref 30.0–36.0)
MCV: 93.6 fL (ref 78.0–100.0)
PLATELETS: 194 10*3/uL (ref 150–400)
RBC: 6.68 MIL/uL — ABNORMAL HIGH (ref 4.22–5.81)
RDW: 17.6 % — ABNORMAL HIGH (ref 11.5–15.5)
WBC: 15.6 10*3/uL — ABNORMAL HIGH (ref 4.0–10.5)

## 2015-04-15 LAB — TROPONIN I: Troponin I: 0.06 ng/mL — ABNORMAL HIGH (ref <0.031)

## 2015-04-15 LAB — PHOSPHORUS: PHOSPHORUS: 1.3 mg/dL — AB (ref 2.5–4.6)

## 2015-04-15 LAB — PROCALCITONIN: PROCALCITONIN: 0.17 ng/mL

## 2015-04-15 LAB — MAGNESIUM: MAGNESIUM: 1.1 mg/dL — AB (ref 1.7–2.4)

## 2015-04-15 MED ORDER — VITAL 1.0 CAL PO LIQD
1000.0000 mL | ORAL | Status: DC
  Filled 2015-04-15: qty 1500

## 2015-04-15 MED ORDER — ANTISEPTIC ORAL RINSE SOLUTION (CORINZ)
7.0000 mL | Freq: Four times a day (QID) | OROMUCOSAL | Status: DC
  Administered 2015-04-15 – 2015-04-16 (×5): 7 mL via OROMUCOSAL

## 2015-04-15 MED ORDER — FUROSEMIDE 10 MG/ML IJ SOLN
40.0000 mg | Freq: Two times a day (BID) | INTRAMUSCULAR | Status: DC
  Administered 2015-04-15 – 2015-04-16 (×2): 40 mg via INTRAVENOUS
  Filled 2015-04-15 (×2): qty 4

## 2015-04-15 MED ORDER — HYDRALAZINE HCL 20 MG/ML IJ SOLN
10.0000 mg | INTRAMUSCULAR | Status: DC | PRN
Start: 2015-04-15 — End: 2015-04-16
  Administered 2015-04-15 – 2015-04-16 (×2): 10 mg via INTRAVENOUS
  Filled 2015-04-15 (×2): qty 1

## 2015-04-15 MED ORDER — CHLORHEXIDINE GLUCONATE 0.12% ORAL RINSE (MEDLINE KIT)
15.0000 mL | Freq: Two times a day (BID) | OROMUCOSAL | Status: DC
  Administered 2015-04-15 – 2015-04-16 (×4): 15 mL via OROMUCOSAL

## 2015-04-15 MED ORDER — POTASSIUM PHOSPHATES 15 MMOLE/5ML IV SOLN
10.0000 mmol | Freq: Once | INTRAVENOUS | Status: AC
  Administered 2015-04-15: 10 mmol via INTRAVENOUS
  Filled 2015-04-15: qty 3.33

## 2015-04-15 MED ORDER — INSULIN ASPART 100 UNIT/ML ~~LOC~~ SOLN: 0.0000 [IU] | SUBCUTANEOUS | Status: DC

## 2015-04-15 MED ORDER — MAGNESIUM SULFATE 2 GM/50ML IV SOLN
2.0000 g | Freq: Once | INTRAVENOUS | Status: AC
  Administered 2015-04-15: 2 g via INTRAVENOUS
  Filled 2015-04-15: qty 50

## 2015-04-15 MED ORDER — VITAL 1.0 CAL PO LIQD
237.0000 mL | ORAL | Status: DC
  Administered 2015-04-15 – 2015-04-16 (×2): 237 mL via ORAL
  Filled 2015-04-15 (×3): qty 237

## 2015-04-15 NOTE — Progress Notes (Signed)
Chart briefly reviewed. Admitted by [facility] M.D. on 04/14/15. Patient subsequently deteriorated and was intubated and admitted to ICU. Primary care taken over by CCM. TRH will sign off at this time. Please consult [place] for any further assistance.  Vernell Leep, MD, FACP, FHM. [facility] Pager [phone]  If 7PM-7AM, please contact night-coverage [email or web address] Password [email or web address] 04/15/2015, 1:00 PM

## 2015-04-15 NOTE — Progress Notes (Signed)
[place] Progress Note Patient Name: Douglas [name]. DOB: [birth year] MRN: [id number]   Date of Service  04/15/2015  HPI/Events of Note  Steadily increasing high blood pressure  eICU Interventions  Hydralazine IV when necessary     Intervention Category Intermediate Interventions: Hypertension - evaluation and management  Tera Partridge 04/15/2015, 10:36 PM

## 2015-04-15 NOTE — Progress Notes (Addendum)
Initial Nutrition Assessment  DOCUMENTATION CODES:   Obesity unspecified  INTERVENTION:  -Recommend Vital High Protein @ 53mL/hr increase by 10 every 12 hours to goal rate of 66mL/hr -Pro-Stat 34mL 5 times/day, each supplement provides 100 calories and 15g protein -Tubefeeding regimen, at goal, provides 1460 calories, 162g protein, 802cc free water -Monitor Mg, Phos, K for at least 3 days, MD replete as needed, pt is refeeding   NUTRITION DIAGNOSIS:   Inadequate oral intake related to inability to eat as evidenced by NPO status, other (see comment).  GOAL:   Provide needs based on ASPEN/SCCM guidelines  MONITOR:   Vent status, I & O's, Labs, Weight trends  REASON FOR ASSESSMENT:   Ventilator    ASSESSMENT:   30 -year-old male with past medical history of OHS/OSA, noncompliant with BiPAP, morbid obesity, HTN, diastolic CHF with prominent RV failure. He follows in heart failure clinic. He takes Lasix 80 mg twice daily per cardiology recommendation. He presented to [facility] long hospital with reports of feeling dizzy and nauseous and having vomiting earlier this morning.  Patient is currently intubated on ventilator support MV: 10.6 L/min Temp (24hrs), Avg:98.8 F (37.1 C), Min:97.7 F (36.5 C), Max:99.6 F (37.6 C)  Propofol: 11 ml/hr - 290 calories   Mr. Madson was intubated, no family at bedside. Presented to ED w/ nausea and vomiting, dizziness. Per chart, he has not used his BiPAP in more than 1 month. Currently he is being diuresed, on Empiric ABX to cover for aspiration PNA and CAP.  RD does not having TF MGMT privileges at this time. Currently he is receiving Vital 1.0 @ 75mL/hr  Nutrition-Focused physical exam completed. Findings are no fat depletion, no muscle depletion, and no edema.   Labs: Mg 1.1, Phos 1.3 Medications: Propofol drip, Fentanyl drip, Versed PRN, KPhos Runs  Diet Order:  Diet NPO time specified  Skin:  Reviewed, no issues  Last BM:   3/20  Height:   Ht Readings from Last 1 Encounters:  04/14/15 5\' 7"  (1.702 m)    Weight:   Wt Readings from Last 1 Encounters:  04/15/15 280 lb 3.3 oz (127.1 kg)    Ideal Body Weight:  67.27 kg  BMI:  Body mass index is 43.88 kg/(m^2).  Estimated Nutritional Needs:   Kcal:  1400-1800 calories  Protein:  150-190 grams  Fluid:  >/= 1.4L  EDUCATION NEEDS:   No education needs identified at this time  Satira Anis. Aira Sallade, MS, RD LDN After Hours/Weekend Pager [phone]

## 2015-04-15 NOTE — Progress Notes (Signed)
PULMONARY / CRITICAL CARE MEDICINE   Name: Douglas [name]. MRN: [id number] DOB: 1986-01-11    ADMISSION DATE:  04/14/2015 CONSULTATION DATE:  3/21  REFERRING MD:  Charlies Silvers   CHIEF COMPLAINT:  Acute on chronic resp failure   HISTORY OF PRESENT ILLNESS:   This is a 30 year old male w/ chronic resp failure in setting of severe OSA/OHS. Has h/o BIPAP non-compliance. Followed at the heart failure clinic for severe secondary PAH, Cor Pulmonale & diastolic dysfxn. Marland Kitchen His father who is at bedside reports BIPAP not been used in about a month & that he had been more somnolent than usual. Also noted some confusion on 3/20. He awoke nauseated and dizzy at 0400 on 3/21. Called EMS who reported he was not using his BIPAP. At 0400 on 3/21 he awoke feeling dizzy and nauseated. Vomited 3-4 times. Called EMS. On arrival was more lethargic. Placed on NIPPV. In spite of attempts w/ NIPPV he became progressively lethargic and hypercarbic (as well as hypoxic). His PO2 dropped in 60s and PCO2 was to high to evaluate. Because of this he was intubated & PCCM asked to assume care.   PAST MEDICAL HISTORY :  He  has a past medical history of Hypertension; Obesity; Tobacco abuse; Substance abuse (8/31/213); High cholesterol; CHF (congestive heart failure) ([place]); Chronic bronchitis ([place]); OSA on CPAP; Arthritis; and Pneumonia (09/2011).  PAST SURGICAL HISTORY: He  has past surgical history that includes No past surgeries and TUBES IN EARS.  No Known Allergies  No current facility-administered medications on file prior to encounter.   Current Outpatient Prescriptions on File Prior to Encounter  Medication Sig  . albuterol (PROVENTIL HFA;VENTOLIN HFA) 108 (90 BASE) MCG/ACT inhaler Inhale 1-2 puffs into the lungs every 6 (six) hours as needed for wheezing or shortness of breath.  Marland Kitchen aspirin 325 MG EC tablet Take 1 tablet (325 mg total) by mouth daily.  . carvedilol (COREG) 3.125 MG tablet Take 1 tablet (3.125 mg total)  by mouth 2 (two) times daily with a meal.  . furosemide (LASIX) 40 MG tablet Take 2 tablets (80 mg total) by mouth 2 (two) times daily.  Marland Kitchen lisinopril (PRINIVIL,ZESTRIL) 5 MG tablet Take 1 tablet (5 mg total) by mouth 2 (two) times daily.  . potassium chloride SA (K-DUR,KLOR-CON) 20 MEQ tablet Take 1 tablet (20 mEq total) by mouth daily.    FAMILY HISTORY:  His indicated that his mother is alive. He indicated that his father is alive.   SOCIAL HISTORY: He  reports that he quit smoking about 17 months ago. His smoking use included Cigarettes. He has a 4.5 pack-year smoking history. He has never used smokeless tobacco. He reports that he uses illicit drugs (Cocaine). He reports that he does not drink alcohol.  REVIEW OF SYSTEMS:   Unable   SUBJECTIVE:  Critically ill on vent   VITAL SIGNS: BP 129/64 mmHg  Pulse 90  Temp(Src) 99.4 F (37.4 C) (Oral)  Resp 20  Ht 5\' 7"  (1.702 m)  Wt 280 lb 3.3 oz (127.1 kg)  BMI 43.88 kg/m2  SpO2 97%  HEMODYNAMICS:    VENTILATOR SETTINGS: Vent Mode:  [-] PRVC FiO2 (%):  [60 %-100 %] 60 % Set Rate:  [14 bmp-20 bmp] 20 bmp Vt Set:  [530 mL] 530 mL PEEP:  [12 cmH20-14 cmH20] 12 cmH20 Plateau Pressure:  [24 cmH20-31 cmH20] 24 cmH20  INTAKE / OUTPUT: I/O last 3 completed shifts: In: 1552.8 [I.V.:982.8; NG/GT:120; IV Piggyback:450] Out: [id number];  Emesis/NG output:3800]  PHYSICAL EXAMINATION: General:  Obese aam sedated  on vent.  Neuro:  Opens eyes despite sedation HEENT:  Neck is large and short. No JVD. MMM PERRL  Cardiovascular:  Rrr, no MRG  Lungs: . Scattered rhonchi  Abdomen:  Obese, no OM, + bowel sounds Musculoskeletal:  Equal st and bulk  Skin:  Warm and dry, LE chronic venous changes.   LABS:  BMET  Recent Labs Lab 04/14/15 0608 04/14/15 1630 04/15/15 0307  NA 136 138 139  K 5.3* 3.8 4.4  CL 89* 86* 89*  CO2 42* 42* 42*  BUN 19 17 17   CREATININE 1.16 1.12 1.47*  GLUCOSE 134* 111* 88     Electrolytes  Recent Labs Lab 04/14/15 0608 04/14/15 1630 04/15/15 0307  CALCIUM 8.2* 8.5* 8.4*  MG  --   --  1.1*  PHOS  --   --  1.3*    CBC  Recent Labs Lab 04/14/15 0608 04/14/15 1645 04/15/15 0307  WBC 10.1 18.7* 15.6*  HGB 17.1* 17.8* 17.4*  HCT 64.6* 61.5* 62.5*  PLT 242 193 194    Coag's No results for input(s): APTT, INR in the last 168 hours.  Sepsis Markers  Recent Labs Lab 04/14/15 0619 04/15/15 0307  LATICACIDVEN 1.21  --   PROCALCITON  --  0.17    ABG  Recent Labs Lab 04/14/15 1000 04/14/15 1322 04/15/15 0500  PHART 7.156* 7.457* 7.569*  PCO2ART ABOVE REPORTABLE RANGE.RBV 61.3* 49.2*  PO2ART 152* 146* 158*    Liver Enzymes  Recent Labs Lab 04/14/15 0608 04/14/15 1630  AST 39 31  ALT 15* 21  ALKPHOS 55 52  BILITOT 0.9 2.0*  ALBUMIN 2.9* 2.8*    Cardiac Enzymes  Recent Labs Lab 04/14/15 1630 04/14/15 2139 04/15/15 0307  TROPONINI <0.03 0.04* 0.06*    Glucose  Recent Labs Lab 04/14/15 0609 04/15/15 0744  GLUCAP 129* 86    Imaging Dg Chest Port 1 View  04/14/2015  CLINICAL DATA:  Status post central line placement EXAM: PORTABLE CHEST 1 VIEW COMPARISON:  04/14/2015 FINDINGS: Cardiac shadow is stable. Endotracheal tube is again identified 5 cm above the carina. A nasogastric catheter is seen within the stomach. A new left jugular central line is noted in the proximal superior vena cava. No pneumothorax is noted. Diffuse infiltrates are noted throughout the right lung similar to that seen on the prior exam. The left lung remains predominantly clear. IMPRESSION: Stable right-sided infiltrate. Status post central line placement without pneumothorax. Electronically Signed   By: Inez Catalina M.D.   On: 04/14/2015 16:46   Dg Chest Portable 1 View  04/14/2015  CLINICAL DATA:  Respiratory distress EXAM: PORTABLE CHEST 1 VIEW COMPARISON:  04/14/2015 540 hours FINDINGS: Endotracheal tube placed. Tip is 2.5 cm from the carina.  NG tube tip is beyond the gastroesophageal junction. There is increasing shift of the mediastinum to the right with increasing opacity at the right base suggesting right lower lobe collapse. There is probably also partial collapse in the peripheral right middle lobe. Heart remains enlarged. Normal pulmonary vascularity. IMPRESSION: Endotracheal tube tip is 2.5 cm above the carina Right lower lobe collapse.  Partial right middle lobe collapse. Electronically Signed   By: Marybelle Killings M.D.   On: 04/14/2015 11:45     STUDIES:    CULTURES: Influenza PCR 3/21>>>neg Sputum culture 3/21>>> BC x 2 3/21>>>  ANTIBIOTICS: unasyn 3/31>>> azithro 3/21>>>  SIGNIFICANT EVENTS:   LINES/TUBES: OETT 3/21 (difficulty airway)>>> 3/21 rt i  j cvl>>  DISCUSSION: This is a 30 year old male w/ sig h/o chronic resp failure in setting of severe OSA/OHS and associated secondary PAH, cor pulmonale and diastolic HF. His father who is at bedside reports BIPAP not been used in about a month & that he had been more somnolent than usual. Also noted some confusion on 3/20. He awoke nauseated and dizzy at 0400 on 3/21. Called EMS who reported he was not using his BIPAP. Became progressively lethargic and hypercarbic requiring intubation. This is most likely decompensated OHS/OSA/cor pulmonale w/ decompensated diastolic dysfxn, but W/ nausea and dizziness need to consider: possible viral process w/ subsequent aspiration, CAP, or atypical cardiac ischemia presentation. Will admit to ICU, pan culture, start empiric abx to cover for both aspiration as well as CAP. Will cont Diuresis as BP/BUN and creatinine allow.  Filed Weights   04/14/15 0739 04/14/15 1105 04/15/15 0435  Weight: 270 lb (122.471 kg) 289 lb 14.5 oz (131.5 kg) 280 lb 3.3 oz (127.1 kg)   ASSESSMENT / PLAN:  PULMONARY A: Acute on Chronic Hypercarbic and Hypoxic respiratory failure H/o  OSA/OHS and cor pulmonale. Possible PNA DIFFICULT AIRWAY  BIPAP  no-compliance  ->his presentation is not typical of decompensated OHS/OSA (was actually doing better the day before). P:   Full vent support  Adjust Peep/FIO2 PAD protocol  Sputum culture  See ID section  Cycle CEs and ck BNP   CARDIOVASCULAR A:  Secondary PAH Cor pulmonale Diastolic Heart Failure P:  Lasix as BP/UN and creatinine allow  BP control (goal <160) Negative volume status goal    Intake/Output Summary (Last 24 hours) at 04/15/15 0810 Last data filed at 04/15/15 0800  Gross per 24 hour  Intake 1552.78 ml  Output   4660 ml  Net -3107.22 ml   Cycle CEs 0.04->0.06  RENAL  Recent Labs Lab 04/14/15 0608 04/14/15 1630 04/15/15 0307  K 5.3* 3.8 4.4     A:   Mild hyperkalemia  P:   F/u chemistry  Strict I&O Neg volume status goal   GASTROINTESTINAL A:   Obesity  Nausea/vomiting Elevated ammonia ? Hepatic congestion  P:   PPI for SUP tubefeeds if requires ventilation > 24hrs  Ck Lipase Ck LFTs  HEMATOLOGIC A:   Polycythemia in setting of chronic hypoxia  P:  [place] heparin  Trend CBC   INFECTIOUS A:   RLL PNA vs atx P:   Ck influenza panel  PCT algo  Sputum culture  BCX2 Start empiric coverage for aspiration and CAP  ENDOCRINE CBG (last 3)   Recent Labs  04/14/15 0609 04/15/15 0744  GLUCAP 129* 86     A:   Mild Hyperglycemia  P:   Trend glucose, start SSI if > 150 on repeat checks  NEUROLOGIC A:   Acute hypercarbic Encephalopathy.  P:   RASS goal: -2 PAD protocol  Lactulose for hyperammonemia   FAMILY  - Updates: pending   - Inter-disciplinary family meet or Palliative Care meeting due by: 3/28   Global: Sedate, diureses, start tube feeds 3/22.    Richardson Landry Minor ACNP Maryanna Shape PCCM Pager [phone] till 3 pm If no answer page [phone] 04/15/2015, 8:08 AM   ATTENDING NOTE: I have personally reviewed patient's available data, including medical history, events of note, physical examination and test results as part  of my evaluation. I have discussed with resident/NP  Steve Minor. .30 y.o. year old male, admitted for hypercapneic resp failure 2/2 non compliance with bipap/volume overload/asp pna.  With morbid obesity/OHS/CHFpEF. PE -- morbidly obese, intubated, sedated, less crackles, VSS, afebrile, less edema. Plan to cont vent support for now, not ready for wean, decrease lasix dose, cont abx pending cultures, start TF. Plan for SBT in am. May need NIV as BiPaP not working well anymore.  I spent 30 minutes of critical care time with this pt today. Family updated at bedside.     Code status : Full code.     Critical Care Time devoted to patient care services described in this note independent of APP time is 30 minutes.   Monica Becton, MD 04/15/2015,   7:03 PM [facility] Pager [phone] After 3 pm or if no answer, call [phone]

## 2015-04-16 ENCOUNTER — Inpatient Hospital Stay (HOSPITAL_COMMUNITY): Payer: Medicaid Other

## 2015-04-16 DIAGNOSIS — I5033 Acute on chronic diastolic (congestive) heart failure: Secondary | ICD-10-CM

## 2015-04-16 LAB — BLOOD GAS, ARTERIAL
Acid-Base Excess: 15.1 mmol/L — ABNORMAL HIGH (ref 0.0–2.0)
BICARBONATE: 39.5 meq/L — AB (ref 20.0–24.0)
Drawn by: 308601
FIO2: 0.4
O2 Saturation: 95.2 %
PATIENT TEMPERATURE: 99.3
PCO2 ART: 42.8 mmHg (ref 35.0–45.0)
PEEP: 8 cmH2O
RATE: 20 resp/min
TCO2: 31.4 mmol/L (ref 0–100)
VT: 530 mL
pH, Arterial: 7.574 — ABNORMAL HIGH (ref 7.350–7.450)
pO2, Arterial: 73.6 mmHg — ABNORMAL LOW (ref 80.0–100.0)

## 2015-04-16 LAB — CBC
HEMATOCRIT: 59.5 % — AB (ref 39.0–52.0)
HEMOGLOBIN: 17.7 g/dL — AB (ref 13.0–17.0)
MCH: 25.9 pg — ABNORMAL LOW (ref 26.0–34.0)
MCHC: 29.7 g/dL — ABNORMAL LOW (ref 30.0–36.0)
MCV: 87 fL (ref 78.0–100.0)
PLATELETS: 180 10*3/uL (ref 150–400)
RBC: 6.84 MIL/uL — ABNORMAL HIGH (ref 4.22–5.81)
RDW: 17.8 % — AB (ref 11.5–15.5)
WBC: 15.7 10*3/uL — AB (ref 4.0–10.5)

## 2015-04-16 LAB — GLUCOSE, CAPILLARY
GLUCOSE-CAPILLARY: 101 mg/dL — AB (ref 65–99)
GLUCOSE-CAPILLARY: 114 mg/dL — AB (ref 65–99)
Glucose-Capillary: 161 mg/dL — ABNORMAL HIGH (ref 65–99)

## 2015-04-16 LAB — LEGIONELLA PNEUMOPHILA SEROGP 1 UR AG: L. PNEUMOPHILA SEROGP 1 UR AG: NEGATIVE

## 2015-04-16 LAB — BASIC METABOLIC PANEL
Anion gap: 13 (ref 5–15)
BUN: 14 mg/dL (ref 6–20)
CALCIUM: 8.9 mg/dL (ref 8.9–10.3)
CO2: 35 mmol/L — ABNORMAL HIGH (ref 22–32)
CREATININE: 1.17 mg/dL (ref 0.61–1.24)
Chloride: 90 mmol/L — ABNORMAL LOW (ref 101–111)
Glucose, Bld: 104 mg/dL — ABNORMAL HIGH (ref 65–99)
Potassium: 3.3 mmol/L — ABNORMAL LOW (ref 3.5–5.1)
SODIUM: 138 mmol/L (ref 135–145)

## 2015-04-16 LAB — PROCALCITONIN: Procalcitonin: 0.12 ng/mL

## 2015-04-16 LAB — MAGNESIUM: Magnesium: 1.6 mg/dL — ABNORMAL LOW (ref 1.7–2.4)

## 2015-04-16 LAB — PHOSPHORUS: PHOSPHORUS: 3.2 mg/dL (ref 2.5–4.6)

## 2015-04-16 MED ORDER — FUROSEMIDE 40 MG PO TABS
80.0000 mg | ORAL_TABLET | Freq: Two times a day (BID) | ORAL | Status: DC
  Administered 2015-04-16 – 2015-04-17 (×3): 80 mg via ORAL
  Filled 2015-04-16 (×3): qty 2

## 2015-04-16 MED ORDER — POTASSIUM CHLORIDE 20 MEQ/15ML (10%) PO SOLN
20.0000 meq | ORAL | Status: DC
  Administered 2015-04-16: 20 meq
  Filled 2015-04-16: qty 15

## 2015-04-16 MED ORDER — CARVEDILOL 3.125 MG PO TABS
3.1250 mg | ORAL_TABLET | Freq: Two times a day (BID) | ORAL | Status: DC
  Administered 2015-04-16 – 2015-04-28 (×24): 3.125 mg via ORAL
  Filled 2015-04-16 (×24): qty 1

## 2015-04-16 MED ORDER — HYDRALAZINE HCL 20 MG/ML IJ SOLN
10.0000 mg | INTRAMUSCULAR | Status: DC | PRN
  Administered 2015-04-16 – 2015-04-24 (×4): 10 mg via INTRAVENOUS
  Filled 2015-04-16 (×4): qty 1

## 2015-04-16 MED ORDER — POTASSIUM CHLORIDE CRYS ER 20 MEQ PO TBCR
40.0000 meq | EXTENDED_RELEASE_TABLET | Freq: Every day | ORAL | Status: DC
  Administered 2015-04-16 – 2015-04-28 (×13): 40 meq via ORAL
  Filled 2015-04-16 (×14): qty 2

## 2015-04-16 MED ORDER — POTASSIUM CHLORIDE 20 MEQ/15ML (10%) PO SOLN
40.0000 meq | Freq: Every day | ORAL | Status: DC
Start: 2015-04-16 — End: 2015-04-16

## 2015-04-16 MED ORDER — AMPICILLIN-SULBACTAM SODIUM 3 (2-1) G IJ SOLR
3.0000 g | Freq: Four times a day (QID) | INTRAMUSCULAR | Status: DC
  Administered 2015-04-16 – 2015-04-17 (×3): 3 g via INTRAVENOUS
  Filled 2015-04-16 (×4): qty 3

## 2015-04-16 MED ORDER — CETYLPYRIDINIUM CHLORIDE 0.05 % MT LIQD
7.0000 mL | Freq: Two times a day (BID) | OROMUCOSAL | Status: DC
  Administered 2015-04-16 – 2015-04-26 (×17): 7 mL via OROMUCOSAL

## 2015-04-16 MED ORDER — POTASSIUM CHLORIDE CRYS ER 20 MEQ PO TBCR
20.0000 meq | EXTENDED_RELEASE_TABLET | Freq: Once | ORAL | Status: AC
  Administered 2015-04-16: 20 meq via ORAL
  Filled 2015-04-16: qty 1

## 2015-04-16 MED ORDER — MAGNESIUM SULFATE 4 GM/100ML IV SOLN
4.0000 g | Freq: Once | INTRAVENOUS | Status: AC
  Administered 2015-04-16: 4 g via INTRAVENOUS
  Filled 2015-04-16: qty 100

## 2015-04-16 NOTE — Progress Notes (Signed)
100 ml fentanyl infusion wasted in sink.

## 2015-04-16 NOTE — Progress Notes (Signed)
MD ask RT to placed pt on BIPAP after extubation for sats of 88% on 4LPM [place]. No distress noted at this time.

## 2015-04-16 NOTE — Progress Notes (Signed)
Rt went to do neb tx. Pt setting in chair on 5LPM [place] and requested to get back in bed with BIPAP for rest. Pt in no distress, vitals stable.

## 2015-04-16 NOTE — Procedures (Signed)
Extubation Procedure Note  Patient Details:   Name: Douglas [name]. DOB: 1985-09-02 MRN: [id number]   Airway Documentation:     Evaluation  O2 sats: stable throughout Complications: No apparent complications Patient did tolerate procedure well. Bilateral Breath Sounds: Clear, Diminished Suctioning: Oral, Airway Yes  [name] 04/16/2015, 9:38 AM

## 2015-04-16 NOTE — Progress Notes (Signed)
PULMONARY / CRITICAL CARE MEDICINE   Name: Douglas [name]. MRN: [id number] DOB: [birth year]    ADMISSION DATE:  04/14/2015 CONSULTATION DATE:  3/21  REFERRING MD:  Charlies Silvers   CHIEF COMPLAINT:  Acute on chronic resp failure   HISTORY OF PRESENT ILLNESS:   This is a 30 year old male w/ chronic resp failure in setting of severe OSA/OHS. Has h/o BIPAP non-compliance. Followed at the heart failure clinic for severe secondary PAH, Cor Pulmonale & diastolic dysfxn. Marland Kitchen His father who is at bedside reports BIPAP not been used in about a month & that he had been more somnolent than usual. Also noted some confusion on 3/20. He awoke nauseated and dizzy at 0400 on 3/21. Called EMS who reported he was not using his BIPAP. At 0400 on 3/21 he awoke feeling dizzy and nauseated. Vomited 3-4 times. Called EMS. On arrival was more lethargic. Placed on NIPPV. In spite of attempts w/ NIPPV he became progressively lethargic and hypercarbic (as well as hypoxic). His PO2 dropped in 60s and PCO2 was to high to evaluate. Because of this he was intubated & PCCM asked to assume care.    SUBJECTIVE:  Critically ill on vent   VITAL SIGNS: BP 143/89 mmHg  Pulse 109  Temp(Src) 99.1 F (37.3 C) (Oral)  Resp 17  Ht 5\' 7"  (1.702 m)  Wt 283 lb 1.1 oz (128.4 kg)  BMI 44.32 kg/m2  SpO2 90%  HEMODYNAMICS:    VENTILATOR SETTINGS: Vent Mode:  [-] BIPAP FiO2 (%):  [40 %-50 %] 40 % Set Rate:  [20 bmp] 20 bmp Vt Set:  [530 mL] 530 mL PEEP:  [5 cmH20-12 cmH20] 5 cmH20 Pressure Support:  [5 cmH20] 5 cmH20 Plateau Pressure:  [20 [id number] cmH20] 20 cmH20  INTAKE / OUTPUT: I/O last 3 completed shifts: In: 2315.6 [I.V.:1335.6; NG/GT:180; IV Piggyback:800] Out: [id number] [Urine:3280; Emesis/NG output:400]  PHYSICAL EXAMINATION: General:  Obese aam, no distress s/p extubation  Neuro:  Awake, alert, no focal def  HEENT:  Neck is large and short. No JVD. MMM PERRL  Cardiovascular:  Rrr, no MRG  Lungs: .clear and w/out  accessory use  Abdomen:  Obese, no OM, + bowel sounds Musculoskeletal:  Equal st and bulk  Skin:  Warm and dry, LE chronic venous changes.   LABS:  BMET  Recent Labs Lab 04/14/15 1630 04/15/15 0307 04/16/15 0400  NA 138 139 138  K 3.8 4.4 3.3*  CL 86* 89* 90*  CO2 42* 42* 35*  BUN 17 17 14   CREATININE 1.12 1.47* 1.17  GLUCOSE 111* 88 104*    Electrolytes  Recent Labs Lab 04/14/15 1630 04/15/15 0307 04/16/15 0400  CALCIUM 8.5* 8.4* 8.9  MG  --  1.1* 1.6*  PHOS  --  1.3* 3.2    CBC  Recent Labs Lab 04/14/15 1645 04/15/15 0307 04/16/15 0400  WBC 18.7* 15.6* 15.7*  HGB 17.8* 17.4* 17.7*  HCT 61.5* 62.5* 59.5*  PLT 193 194 180    Coag's No results for input(s): APTT, INR in the last 168 hours.  Sepsis Markers  Recent Labs Lab 04/14/15 0619 04/15/15 0307 04/16/15 0400  LATICACIDVEN 1.21  --   --   PROCALCITON  --  0.17 0.12    ABG  Recent Labs Lab 04/14/15 1322 04/15/15 0500 04/16/15 0345  PHART 7.457* 7.569* 7.574*  PCO2ART 61.3* 49.2* 42.8  PO2ART 146* 158* 73.6*    Liver Enzymes  Recent Labs Lab 04/14/15 0608 04/14/15 1630  AST 39  31  ALT 15* 21  ALKPHOS 55 52  BILITOT 0.9 2.0*  ALBUMIN 2.9* 2.8*    Cardiac Enzymes  Recent Labs Lab 04/14/15 1630 04/14/15 2139 04/15/15 0307  TROPONINI <0.03 0.04* 0.06*    Glucose  Recent Labs Lab 04/15/15 1157 04/15/15 1641 04/15/15 2022 04/15/15 2318 04/16/15 0350 04/16/15 0753  GLUCAP 105* 97 115* 103* 101* 114*    Imaging Dg Chest Port 1 View  04/16/2015  CLINICAL DATA:  Respiratory failure. EXAM: PORTABLE CHEST 1 VIEW COMPARISON:  04/14/2015. FINDINGS: Endotracheal tube, NG tube, left IJ line stable position. Mediastinum hilar structures are stable. Stable cardiomegaly. Persistent but improving bibasilar infiltrates. No pleural effusion or pneumothorax. IMPRESSION: 1.  Lines and tubes in stable position. 2.  Stable cardiomegaly. 3. Persistent but improving bibasilar  atelectasis and/or infiltrates. Electronically Signed   By: Marcello Moores  Register   On: 04/16/2015 07:11     STUDIES:    CULTURES: Influenza PCR 3/21>>>neg Sputum culture 3/21>>> BC x 2 3/21>>>  ANTIBIOTICS: unasyn 3/31>>> azithro 3/21>>>3/23  SIGNIFICANT EVENTS:   LINES/TUBES: OETT 3/21 (difficulty airway)>>>3/23 3/21 rt i j cvl>>  DISCUSSION: Acute on Chronic Hypercarbic and Hypoxic respiratory failure in setting of decompensated OHS/OSA w/ secondary PAH resulting in decompensated Cor Pulmonale and diastolic dysfxn. Clinically much improved. Extubated today. CXR looks better. Will place him back on his lasix oral, cont IV abx for now. Cont BIPAP (mandatory) HS and PRN. Will need CM assist as his BIPAP has not been working at home.   ASSESSMENT / PLAN:  Acute on Chronic Hypercarbic and Hypoxic respiratory failure in setting of decompensated OHS/OSA w/ secondary PAH resulting in decompensated Cor Pulmonale and diastolic dysfxn.  DIFFICULT AIRWAY  BIPAP no-compliance  ->extubated 3/23 Plan:   Resume lasix home dosing  OOB BIPAP at HS and rest Arrange for new home bipap  PNA vs atx Plan Cont unasyn for now.     Secondary PAH Cor pulmonale Diastolic Heart Failure Plan:  BP control   Obesity  Nausea/vomiting Elevated ammonia ? Hepatic congestion  Plan:   PPI for SUP Start diet this evening   Polycythemia in setting of chronic hypoxia  Plan:  [place] heparin  Trend CBC   Acute hypercarbic Encephalopathy-->resolved  Plan:   Stop all sedating meds  Lactulose for hyperammonemia   FAMILY  - Updates: pending   - Inter-disciplinary family meet or Palliative Care meeting due by: 3/28   Global: Sedate, diureses, start tube feeds 3/22.    Erick Colace ACNP-BC [place] Pager # [phone] [place] # [phone] if no answer   Code status : Full code.

## 2015-04-16 NOTE — Progress Notes (Signed)
Date:  April 17, 2015 Chart reviewed for concurrent status and case management needs. Will continue to follow patient for changes and needs: continues on full vent support [name], BSN, Therapist, sports, [place]   [phone]

## 2015-04-16 NOTE — Progress Notes (Addendum)
Pt removed from BIPAP to use Bedside toilet.  Pt on 6 LPM [place] and tolerating well at this time.  BIPAP will be placed back on Pt once he is back in bed.  RT to monitor and assess as needed.

## 2015-04-16 NOTE — Clinical Documentation Improvement (Signed)
Internal Medicine Critical Care  Please clarify if the following diagnosis, "Acute on Chronic Diastolic Congestive Heart Failure"  was:   Present at the time of admission (POA)  NOT present at the time of admission and it developed during the inpatient stay  Unable to clinically determine whether the condition was present on admission.  Unknown   Supporting Information:  Per ED physician note, treated with IV Lasix BNP 342.4   Please exercise your independent, professional judgment when responding. A specific answer is not anticipated or expected.   Thank You,  [place] [phone]

## 2015-04-16 NOTE — Progress Notes (Signed)
[facility] Progress Note Patient Name: Douglas [name]. DOB: [birth year] MRN: [id number]   Date of Service  04/16/2015  HPI/Events of Note  [name] check on patient with respiratory failure. Sleeping comfortably With BiPAP in place.  eICU Interventions  Continue close monitoring in the intensive care unit.     Intervention Category Major Interventions: Respiratory failure - evaluation and management  Tera Partridge 04/16/2015, 10:22 PM

## 2015-04-16 NOTE — Progress Notes (Signed)
[facility] Progress Note Patient Name: Douglas [name]. DOB: May 16, 1985 MRN: [id number]   Date of Service  04/16/2015  HPI/Events of Note  Notified by bedside nurse of ongoing hypertension. Patient started on Coreg Postextubation With first dose this evening. Previously ordered hydralazine  Which was discontinued today  After having received 2 doses.  eICU Interventions  1. Continue home Coreg. 2. Reorder hydralazine IV when necessary     Intervention Category Intermediate Interventions: Hypertension - evaluation and management  Tera Partridge 04/16/2015, 3:56 PM

## 2015-04-17 ENCOUNTER — Telehealth: Payer: Self-pay | Admitting: Pulmonary Disease

## 2015-04-17 DIAGNOSIS — G9340 Encephalopathy, unspecified: Secondary | ICD-10-CM

## 2015-04-17 LAB — BASIC METABOLIC PANEL WITH GFR
Anion gap: 8 (ref 5–15)
BUN: 13 mg/dL (ref 6–20)
CO2: 38 mmol/L — ABNORMAL HIGH (ref 22–32)
Calcium: 8.4 mg/dL — ABNORMAL LOW (ref 8.9–10.3)
Chloride: 92 mmol/L — ABNORMAL LOW (ref 101–111)
Creatinine, Ser: 1.1 mg/dL (ref 0.61–1.24)
GFR calc Af Amer: >60 mL/min
GFR calc non Af Amer: >60 mL/min
Glucose, Bld: 87 mg/dL (ref 65–99)
Potassium: 4 mmol/L (ref 3.5–5.1)
Sodium: 138 mmol/L (ref 135–145)

## 2015-04-17 LAB — CULTURE, RESPIRATORY W GRAM STAIN

## 2015-04-17 LAB — MAGNESIUM: Magnesium: 2 mg/dL (ref 1.7–2.4)

## 2015-04-17 LAB — CULTURE, RESPIRATORY: CULTURE: NORMAL

## 2015-04-17 LAB — PHOSPHORUS: Phosphorus: 4.2 mg/dL (ref 2.5–4.6)

## 2015-04-17 NOTE — Telephone Encounter (Signed)
Spoke with Arbie Cookey  She states that she faxed this already  She faxed to the wrong number  I gave her the correct number  Will await fax

## 2015-04-17 NOTE — Telephone Encounter (Signed)
Spoke with Arbie Cookey @ [facility]. She states that pt has history of non-compliance with BiPap. She will get download and send to office ASAP. Will forward to AD when received.

## 2015-04-17 NOTE — Progress Notes (Addendum)
During 0400 rounds Pt requesting to come off BIPAP for a while.  Pt placed on 6 LPM [place] and tolerating well at this time.  Pt to notify RT when he wants mask back on.  RT to monitor and assess as needed.

## 2015-04-17 NOTE — Discharge Summary (Signed)
Physician Discharge Summary       Patient ID: Douglas [name]. MRN: [id number] DOB/AGE: [birth year] 32 y.o.  Admit date: 04/14/2015 Discharge date: 04/28/2015  Discharge Diagnoses:  Acute on Chronic Hypercarbic and Hypoxic respiratory failure  Decompensated OHS/OSA  Secondary PAH  Decompensated Cor Pulmonale  Acute on chronic diastolic heart failure   DIFFICULT AIRWAY  BIPAP no-compliance  Secondary PAH Cor pulmonale Acute on Chronic Diastolic Heart Failure Obesity  Nausea/vomiting Elevated ammonia ? Hepatic congestion  Polycythemia in setting of chronic hypoxia  Acute hypercarbic Encephalopathy     Detailed Hospital Course:   30 year old male w/ chronic resp failure in setting of severe OSA/OHS. Has h/o BIPAP non-compliance. Followed at the heart failure clinic for severe secondary PAH, Cor Pulmonale & diastolic dysfxn.  His father who is at bedside reports BIPAP not been used in about a month & that he had been more somnolent than usual. Also noted some confusion on 3/20. He awoke nauseated and dizzy at 0400 on 3/21. Called EMS who reported he was not using his BIPAP. At 0400 on 3/21 he awoke feeling dizzy and nauseated. Vomited 3-4 times. Called EMS. On arrival was more lethargic. Placed on NIPPV. In spite of attempts w/ NIPPV he became progressively lethargic and hypercarbic (as well as hypoxic). His PO2 dropped in 60s and PCO2 was to high to evaluate. Because of this he was intubated & PCCM asked to assume care. He was admitted to the intensive care. Therapeutic interventions included: mechanical ventilation, IV diuresis, BP control and empiric antibiotics. Cardiac enzymes were negative. Sputum cultures showed only normal oropharyngeal flora. He was extubated on 3/23. He spent most of that day on NIPPV. We transitioned his lasix to oral. He was feeling better. As of 3/24 he was transitioning to PRN day-time BIPAP and HS mandatory. He advanced diet, and activity. Case  management was consulted to assist w/ ensuring his home BIPAP was working. We also contacted the home health agency [facility] so that we could get a down-load from his BIPAP machine. We also wondered to what extent his beard may be playing a role in his BIPAP issues. The rest of the hospital stay was spent ensuring BIPAP tolerance, appropriate medication regimen.  Tracheostomy placement was discussed at length and ENT consulted.  Pt initially agreed, but reconsidered as he got more comfortable with bipap.  He had his home bipap brought in and we ensured several nights of bipap compliance with his home machine.  F/u ABG after 2 nights of home bipap was stable (7.37 / 55.5 / 65.6 / 31.8).  He is much improved, has had extensive counseling regarding the importance of bipap compliance and the potential that he may still need trach placement in the future.  The patient was medically cleared 4/4 for discharge home with plans as above.      Discharge Plan by Active Problems   Acute on Chronic Hypercarbic and Hypoxic respiratory failure in setting of decompensated OHS/OSA w/ secondary PAH resulting in decompensated Cor Pulmonale and diastolic dysfxn.  DIFFICULT AIRWAY  BIPAP non-compliance - improved compliance while inpatient, extubated 3/23  Plan: Continue home lasix  Push physical activity, encouraged to walk daily BIPAP at HS and with daytime sleeping -- compliance has been extensively stressed  Outpatient follow up 4/12 with Pulmonary  Arrange for home health RT   PNA vs atx - favor atelectasis   Plan: Completed antibiotics while inpatient  Secondary PAH Cor pulmonale Acute on Chronic Diastolic Heart Failure  Plan:  BP control - continue home  Cardiology f/u arranged   Obesity  Nausea/vomiting - resolved  Elevated ammonia ? Hepatic congestion   Plan:  PPI for SUP Heart healthy diet for discharge  Polycythemia in setting of chronic hypoxia   Plan:  Intermittent monitoring  of CBC with PCP    Acute hypercarbic Encephalopathy - resolved   Plan:  Encourage home BiPAP compliance  Avoid sedating medications as able     Significant Hospital Tests/ Studies:  CULTURES: Influenza PCR 3/21 >> neg Sputum culture 3/21 >> NPF BC x 2 3/21 >> neg  ANTIBIOTICS: unasyn 3/21 >> 3/24 azithro 3/21 >> 3/23  SIGNIFICANT EVENTS: 3/25- back to NIMV 3/26- refusing NIMV, was neg 3.5 liters 3/27 - wore NIMV for 4 hours, refused to wear any longer  3/30 - wore NIMV for 7 hrs  LINES/TUBES: OETT 3/21 (difficulty airway) >> 3/23 3/21 rt i j cvl >> 3/27   Discharge Exam: BP 129/92 mmHg  Pulse 64  Temp(Src) 98.1 F (36.7 C) (Oral)  Resp 18  Ht 5\' 7"  (1.702 m)  Wt 259 lb 4.2 oz (117.6 kg)  BMI 40.60 kg/m2  SpO2 98%  General: obese male in NAD  Neuro: AAOx4, speech clear, MAE  HEENT:  Mm pink/moist, no jvd CV: s1s2 rrr, no m/r/g PULM: even/non-labored, lungs bilaterally distant but clear GI: obese/soft, bsx4 active  Extremities: warm/dry, no significant edema    Labs at discharge Lab Results  Component Value Date   CREATININE 0.94 04/26/2015   BUN 18 04/26/2015   NA 137 04/26/2015   K 4.0 04/26/2015   CL 97* 04/26/2015   CO2 31 04/26/2015   Lab Results  Component Value Date   WBC 5.6 04/26/2015   HGB 16.5 04/26/2015   HCT 55.6* 04/26/2015   MCV 86.1 04/26/2015   PLT 161 04/26/2015   Lab Results  Component Value Date   ALT 21 04/14/2015   AST 31 04/14/2015   ALKPHOS 52 04/14/2015   BILITOT 2.0* 04/14/2015   No results found for: INR, PROTIME  Current radiology studies No results found.  Disposition:  06-Home-Health Care Svc      Discharge Instructions    (HEART FAILURE PATIENTS) Call MD:  Anytime you have any of the following symptoms: 1) 3 pound weight gain in 24 hours or 5 pounds in 1 week 2) shortness of breath, with or without a dry hacking cough 3) swelling in the hands, feet or stomach 4) if you have to sleep on extra  pillows at night in order to breathe.    Complete by:  As directed      Call MD for:  difficulty breathing, headache or visual disturbances    Complete by:  As directed      Call MD for:  extreme fatigue    Complete by:  As directed      Call MD for:  persistant dizziness or light-headedness    Complete by:  As directed      Call MD for:  persistant nausea and vomiting    Complete by:  As directed      Call MD for:  severe uncontrolled pain    Complete by:  As directed      Call MD for:  temperature >100.4    Complete by:  As directed      Diet - low sodium heart healthy    Complete by:  As directed      Discharge instructions  Complete by:  As directed   1.  Review your medications carefully  2.  YOU MUST WEAR BIPAP EVERY NIGHT and DURING DAYTIME SLEEPING!  This will keep you out of the hospital and away from a tracheostomy.     Increase activity slowly    Complete by:  As directed             Medication List    TAKE these medications        albuterol 108 (90 Base) MCG/ACT inhaler  Commonly known as:  PROVENTIL HFA;VENTOLIN HFA  Inhale 1-2 puffs into the lungs every 6 (six) hours as needed for wheezing or shortness of breath.     aspirin 325 MG EC tablet  Take 1 tablet (325 mg total) by mouth daily.     carvedilol 3.125 MG tablet  Commonly known as:  COREG  Take 1 tablet (3.125 mg total) by mouth 2 (two) times daily with a meal.     furosemide 40 MG tablet  Commonly known as:  LASIX  Take 2 tablets (80 mg total) by mouth 2 (two) times daily.     lisinopril 5 MG tablet  Commonly known as:  PRINIVIL,ZESTRIL  Take 1 tablet (5 mg total) by mouth 2 (two) times daily.     potassium chloride SA 20 MEQ tablet  Commonly known as:  K-DUR,KLOR-CON  Take 1 tablet (20 mEq total) by mouth daily.       Follow-up Information    Follow up with Philis Fendt, MD. Schedule an appointment as soon as possible for a visit in 2 weeks.   Specialty:  Internal Medicine   Contact  information:   [address] [place] [address] [phone]       Follow up with Ermalinda Barrios, PA-C On 05/11/2015.   Specialty:  Cardiology   Why:  8:45am -- cardiology    Contact information:   [address] [place] [address] [phone]       Follow up with Darlina Sicilian, NP On 05/06/2015.   Specialty:  Pulmonary Disease   Why:  pulmonary -- 10:30am    Contact information:   [place] [address] [phone]       Discharged Condition: stable   Signed: Noe Gens, NP-C [facility] Pgr: [phone] or if no answer [phone] 04/28/2015, 11:12 AM    PCCM Attending Note: Patient seen and examined prior to discharge with nurse practitioner. I reviewed her discharge summary. Patient utilizes BiPAP again last night. ABG this morning appropriately compensated. Continuing to clinically improve. Counseled the patient on the need for adherence to his BiPAP nocturnally. Deferring tracheostomy placement for now. Patient has appropriate follow-ups.  I have spent a total of 17 minutes of time today and discharge planning myself.  Sonia Baller Ashok Cordia, M.D. [facility] Pager:  [phone] After 3pm or if no response, call [phone] 11:26 AM 04/28/2015

## 2015-04-17 NOTE — Telephone Encounter (Signed)
Pt is admitted at [facility], Intubated for resp fx. Extubated. Pt states he is using his Bipap BUT Bipap is NOT working. Per ER -- pt is not compliant.  Can we ask DME to check on pt's BiPaP? If it is working?   If bipap is working, can he get a NIV since pt claims it is not working?   Pt is still admitted at [facility].  We want a working bipap when he gets home if possible.  Thanks!  He needs a F/u with VS.   AD

## 2015-04-17 NOTE — Progress Notes (Signed)
PULMONARY / CRITICAL CARE MEDICINE   Name: Douglas [name]. MRN: [id number] DOB: 11-17-1985    ADMISSION DATE:  04/14/2015 CONSULTATION DATE:  3/21  REFERRING MD:  Charlies Silvers   CHIEF COMPLAINT:  Acute on chronic resp failure   HISTORY OF PRESENT ILLNESS:   This is a 30 year old male w/ chronic resp failure in setting of severe OSA/OHS. Has h/o BIPAP non-compliance. Followed at the heart failure clinic for severe secondary PAH, Cor Pulmonale & diastolic dysfxn. Marland Kitchen His father who is at bedside reports BIPAP not been used in about a month & that he had been more somnolent than usual. Also noted some confusion on 3/20. He awoke nauseated and dizzy at 0400 on 3/21. Called EMS who reported he was not using his BIPAP. At 0400 on 3/21 he awoke feeling dizzy and nauseated. Vomited 3-4 times. Called EMS. On arrival was more lethargic. Placed on NIPPV. In spite of attempts w/ NIPPV he became progressively lethargic and hypercarbic (as well as hypoxic). His PO2 dropped in 60s and PCO2 was to high to evaluate. Because of this he was intubated & PCCM asked to assume care.    SUBJECTIVE:  No distress. No complaints.   VITAL SIGNS: BP 132/90 mmHg  Pulse 81  Temp(Src) 98.7 F (37.1 C) (Oral)  Resp 23  Ht 5\' 7"  (1.702 m)  Wt 283 lb 1.1 oz (128.4 kg)  BMI 44.32 kg/m2  SpO2 99%  HEMODYNAMICS:    VENTILATOR SETTINGS: Vent Mode:  [-] BIPAP FiO2 (%):  [40 %] 40 % Set Rate:  [15 bmp] 15 bmp PEEP:  [5 cmH20-8 cmH20] 8 cmH20  INTAKE / OUTPUT: I/O last 3 completed shifts: In: 1846.2 [P.O.:440; I.V.:866.2; NG/GT:140; IV Piggyback:400] Out: [id number] [Urine:2570; Emesis/NG output:400; Stool:200]  PHYSICAL EXAMINATION: General:  Obese aam, no distress Neuro:  Awake, alert, no focal def  HEENT:  Neck is large and short. No JVD. MMM PERRL  Cardiovascular:  Rrr, no MRG  Lungs: .clear and w/out accessory use  Abdomen:  Obese, no OM, + bowel sounds Musculoskeletal:  Equal st and bulk  Skin:  Warm and  dry, LE chronic venous changes.   LABS:  BMET  Recent Labs Lab 04/15/15 0307 04/16/15 0400 04/17/15 0600  NA 139 138 138  K 4.4 3.3* 4.0  CL 89* 90* 92*  CO2 42* 35* 38*  BUN 17 14 13   CREATININE 1.47* 1.17 1.10  GLUCOSE 88 104* 87    Electrolytes  Recent Labs Lab 04/15/15 0307 04/16/15 0400 04/17/15 0600  CALCIUM 8.4* 8.9 8.4*  MG 1.1* 1.6* 2.0  PHOS 1.3* 3.2 4.2    CBC  Recent Labs Lab 04/14/15 1645 04/15/15 0307 04/16/15 0400  WBC 18.7* 15.6* 15.7*  HGB 17.8* 17.4* 17.7*  HCT 61.5* 62.5* 59.5*  PLT 193 194 180    Coag's No results for input(s): APTT, INR in the last 168 hours.  Sepsis Markers  Recent Labs Lab 04/14/15 0619 04/15/15 0307 04/16/15 0400  LATICACIDVEN 1.21  --   --   PROCALCITON  --  0.17 0.12    ABG  Recent Labs Lab 04/14/15 1322 04/15/15 0500 04/16/15 0345  PHART 7.457* 7.569* 7.574*  PCO2ART 61.3* 49.2* 42.8  PO2ART 146* 158* 73.6*    Liver Enzymes  Recent Labs Lab 04/14/15 0608 04/14/15 1630  AST 39 31  ALT 15* 21  ALKPHOS 55 52  BILITOT 0.9 2.0*  ALBUMIN 2.9* 2.8*    Cardiac Enzymes  Recent Labs Lab 04/14/15 1630  04/14/15 2139 04/15/15 0307  TROPONINI <0.03 0.04* 0.06*    Glucose  Recent Labs Lab 04/15/15 1641 04/15/15 2022 04/15/15 2318 04/16/15 0350 04/16/15 0753 04/16/15 1226  GLUCAP 97 115* 103* 101* 114* 161*    Imaging No results found.   STUDIES:    CULTURES: Influenza PCR 3/21>>>neg Sputum culture 3/21>>> NPF BC x 2 3/21>>>  ANTIBIOTICS: unasyn 3/21>>>3/24 azithro 3/21>>>3/23  SIGNIFICANT EVENTS:   LINES/TUBES: OETT 3/21 (difficulty airway)>>>3/23 3/21 rt i j cvl>>    ASSESSMENT / PLAN:  Acute on Chronic Hypercarbic and Hypoxic respiratory failure in setting of decompensated OHS/OSA w/ secondary PAH resulting in decompensated Cor Pulmonale and diastolic dysfxn.  DIFFICULT AIRWAY  BIPAP no-compliance  ->extubated 3/23 Plan:   Resumed lasix home dosing  and f/u chemistry  OOB and ambulate BIPAP at HS and rest Arrange for new home bipap (awaiting feed-back from his DME)-->think he would benefit from Trilogy   PNA vs atx-->favor atx  Plan Dc abx   Secondary PAH Cor pulmonale Acute on Chronic Diastolic Heart Failure Plan:  BP control  Heart failure team f/u after dc   Obesity  Nausea/vomiting Elevated ammonia ? Hepatic congestion  Plan:   PPI for SUP Diet as tolerated   Polycythemia in setting of chronic hypoxia  Plan:  [place] heparin  Trend CBC   Acute hypercarbic Encephalopathy-->resolved  Plan:   Stop all sedating meds  Lactulose for hyperammonemia   DISCUSSION: Acute on Chronic Hypercarbic and Hypoxic respiratory failure in setting of decompensated OHS/OSA w/ secondary PAH resulting in decompensated Cor Pulmonale and diastolic dysfxn. Clinically much improved. Extubated today. CXR looks better. He is back on his lasix orally. No issues over night.  Looks better. Adv diet, mobilize, change BIPAP to PRN day-time and mandatory HS. Could probably go home in next 24-48 hrs IF we can ensure he has working BIPAP at home. Will d/c abx.    Erick Colace ACNP-BC [place] Pager # [phone] OR # [phone] if no answer   Code status : Full code.

## 2015-04-18 ENCOUNTER — Inpatient Hospital Stay (HOSPITAL_COMMUNITY): Payer: Medicaid Other

## 2015-04-18 LAB — BASIC METABOLIC PANEL
Anion gap: 8 (ref 5–15)
BUN: 16 mg/dL (ref 6–20)
CALCIUM: 8.6 mg/dL — AB (ref 8.9–10.3)
CO2: 40 mmol/L — AB (ref 22–32)
CREATININE: 1.03 mg/dL (ref 0.61–1.24)
Chloride: 91 mmol/L — ABNORMAL LOW (ref 101–111)
GFR calc Af Amer: >60 mL/min (ref >60)
GFR calc non Af Amer: >60 mL/min (ref >60)
GLUCOSE: 84 mg/dL (ref 65–99)
Potassium: 4.2 mmol/L (ref 3.5–5.1)
Sodium: 139 mmol/L (ref 135–145)

## 2015-04-18 MED ORDER — FUROSEMIDE 10 MG/ML IJ SOLN
80.0000 mg | Freq: Two times a day (BID) | INTRAMUSCULAR | Status: AC
  Administered 2015-04-18 – 2015-04-19 (×4): 80 mg via INTRAVENOUS
  Filled 2015-04-18 (×4): qty 8

## 2015-04-18 NOTE — Progress Notes (Signed)
Patient refusing BiPap at this time.  Patient states "I don't have to wear it if I don't want to" and upon encouragement to wear from RT patient states "get out of my face."  Patient would not agree to wear nasal cannula.

## 2015-04-18 NOTE — Progress Notes (Signed)
Pt refusing to wear his bipap.  After getting the mask fitting appropriately, the Pt wears it for a short time before pulling it off.  When trying to place the mask back on and fitting appropriately, the Pt refuses to lift his head to allow proper mask placement.  Pt placed on 6Lpm nasal cannula.  RT will continue to monitor and try BiPAP mask placement again if needed.

## 2015-04-18 NOTE — Progress Notes (Signed)
PULMONARY / CRITICAL CARE MEDICINE   Name: Douglas [name]. MRN: [id number] DOB: [birth year]    ADMISSION DATE:  04/14/2015 CONSULTATION DATE:  3/21  REFERRING MD:  Charlies Silvers   CHIEF COMPLAINT:  Acute on chronic resp failure   HISTORY OF PRESENT ILLNESS:   This is a 30 year old male w/ chronic resp failure in setting of severe OSA/OHS. Has h/o BIPAP non-compliance. Followed at the heart failure clinic for severe secondary PAH, Cor Pulmonale & diastolic dysfxn. Marland Kitchen His father who is at bedside reports BIPAP not been used in about a month & that he had been more somnolent than usual. Also noted some confusion on 3/20. He awoke nauseated and dizzy at 0400 on 3/21. Called EMS who reported he was not using his BIPAP. At 0400 on 3/21 he awoke feeling dizzy and nauseated. Vomited 3-4 times. Called EMS. On arrival was more lethargic. Placed on NIPPV. In spite of attempts w/ NIPPV he became progressively lethargic and hypercarbic (as well as hypoxic). His PO2 dropped in 60s and PCO2 was to high to evaluate. Because of this he was intubated & PCCM asked to assume care.    SUBJECTIVE:  Re required NIMV day and night Even balance  VITAL SIGNS: BP 149/104 mmHg  Pulse 93  Temp(Src) 98.8 F (37.1 C) (Oral)  Resp 18  Ht 5\' 7"  (1.702 m)  Wt 128.4 kg (283 lb 1.1 oz)  BMI 44.32 kg/m2  SpO2 96%  HEMODYNAMICS:    VENTILATOR SETTINGS: Vent Mode:  [-] BIPAP;Spontaneous;PCV FiO2 (%):  [40 %] 40 % Set Rate:  [15 bmp] 15 bmp PEEP:  [8 cmH20] 8 cmH20  INTAKE / OUTPUT: I/O last 3 completed shifts: In: 2120 [P.O.:1560; I.V.:360; IV Piggyback:200] Out: 2075 [Urine:1875; Stool:200]  PHYSICAL EXAMINATION: General:  Obese aam, no distress Neuro:  Awakens easily, alert, no focal def  HEENT:  Neck is large and short. No JVD Cardiovascular:  Rrr, no MRG  Lungs: reduced Abdomen:  Obese, no OM, + bowel sounds Musculoskeletal:  Equal st and bulk  Skin:  Warm and dry, LE chronic venous changes.    LABS:  BMET  Recent Labs Lab 04/16/15 0400 04/17/15 0600 04/18/15 0345  NA 138 138 139  K 3.3* 4.0 4.2  CL 90* 92* 91*  CO2 35* 38* 40*  BUN 14 13 16   CREATININE 1.17 1.10 1.03  GLUCOSE 104* 87 84    Electrolytes  Recent Labs Lab 04/15/15 0307 04/16/15 0400 04/17/15 0600 04/18/15 0345  CALCIUM 8.4* 8.9 8.4* 8.6*  MG 1.1* 1.6* 2.0  --   PHOS 1.3* 3.2 4.2  --     CBC  Recent Labs Lab 04/14/15 1645 04/15/15 0307 04/16/15 0400  WBC 18.7* 15.6* 15.7*  HGB 17.8* 17.4* 17.7*  HCT 61.5* 62.5* 59.5*  PLT 193 194 180    Coag's No results for input(s): APTT, INR in the last 168 hours.  Sepsis Markers  Recent Labs Lab 04/14/15 0619 04/15/15 0307 04/16/15 0400  LATICACIDVEN 1.21  --   --   PROCALCITON  --  0.17 0.12    ABG  Recent Labs Lab 04/14/15 1322 04/15/15 0500 04/16/15 0345  PHART 7.457* 7.569* 7.574*  PCO2ART 61.3* 49.2* 42.8  PO2ART 146* 158* 73.6*    Liver Enzymes  Recent Labs Lab 04/14/15 0608 04/14/15 1630  AST 39 31  ALT 15* 21  ALKPHOS 55 52  BILITOT 0.9 2.0*  ALBUMIN 2.9* 2.8*    Cardiac Enzymes  Recent Labs Lab 04/14/15  1630 04/14/15 2139 04/15/15 0307  TROPONINI <0.03 0.04* 0.06*    Glucose  Recent Labs Lab 04/15/15 1641 04/15/15 2022 04/15/15 2318 04/16/15 0350 04/16/15 0753 04/16/15 1226  GLUCAP 97 115* 103* 101* 114* 161*    Imaging No results found.   STUDIES:   CULTURES: Influenza PCR 3/21>>>neg Sputum culture 3/21>>> NPF BC x 2 3/21>>>  ANTIBIOTICS: unasyn 3/21>>>3/24 azithro 3/21>>>3/23  SIGNIFICANT EVENTS: 3/25- back to NIMV  LINES/TUBES: OETT 3/21 (difficulty airway)>>>3/23 3/21 rt i j cvl>>   ASSESSMENT / PLAN:  Acute on Chronic Hypercarbic and Hypoxic respiratory failure in setting of decompensated OHS/OSA w/ secondary PAH resulting in decompensated Cor Pulmonale and diastolic dysfxn.  DIFFICULT AIRWAY  BIPAP no-compliance  ->extubated 3/23 Plan:   Resumed lasix  home dosing and f/u chemistry , but likley needs increase as he was NOT negative and re needed NIMV Arrange for new home bipap pcxr now and in am  Increase lasix to neg balance further Keep in icu pcxr also in am  No abg unless change in MS Schedule NIMV for now 4 on 4 off x 24 bhrs  PNA vs atx-->favor atx  Plan Dc abx  Follow fever curve  Secondary PAH Cor pulmonale Acute on Chronic Diastolic Heart Failure Plan:  BP control  Lasix increase  Obesity  Nausea/vomiting Elevated ammonia ? Hepatic congestion  Plan:   PPI for SUP Diet as tolerated , may need to limit  Polycythemia in setting of chronic hypoxia  Plan:  [place] heparin  Trend CBc further for wbx  Acute hypercarbic Encephalopathy-->resolved  Plan:   Stop all sedating meds  Lactulose for hyperammonemia and BM  Ccm time 30 min   Lavon Paganini. Titus Mould, MD, [place] Pgr: [facility]

## 2015-04-19 ENCOUNTER — Inpatient Hospital Stay (HOSPITAL_COMMUNITY): Payer: Medicaid Other

## 2015-04-19 LAB — CBC WITH DIFFERENTIAL/PLATELET
Basophils Absolute: 0 10*3/uL (ref 0.0–0.1)
Basophils Relative: 0 %
EOS PCT: 6 %
Eosinophils Absolute: 0.4 10*3/uL (ref 0.0–0.7)
HEMATOCRIT: 60.3 % — AB (ref 39.0–52.0)
Hemoglobin: 17 g/dL (ref 13.0–17.0)
LYMPHS ABS: 1.8 10*3/uL (ref 0.7–4.0)
LYMPHS PCT: 25 %
MCH: 25.9 pg — AB (ref 26.0–34.0)
MCHC: 28.2 g/dL — AB (ref 30.0–36.0)
MCV: 91.8 fL (ref 78.0–100.0)
MONO ABS: 0.7 10*3/uL (ref 0.1–1.0)
MONOS PCT: 9 %
NEUTROS ABS: 4.5 10*3/uL (ref 1.7–7.7)
Neutrophils Relative %: 60 %
Platelets: 147 10*3/uL — ABNORMAL LOW (ref 150–400)
RBC: 6.57 MIL/uL — ABNORMAL HIGH (ref 4.22–5.81)
RDW: 17.1 % — ABNORMAL HIGH (ref 11.5–15.5)
WBC: 7.4 10*3/uL (ref 4.0–10.5)

## 2015-04-19 LAB — BASIC METABOLIC PANEL
Anion gap: 7 (ref 5–15)
BUN: 14 mg/dL (ref 6–20)
CHLORIDE: 90 mmol/L — AB (ref 101–111)
CO2: 40 mmol/L — AB (ref 22–32)
Calcium: 8.9 mg/dL (ref 8.9–10.3)
Creatinine, Ser: 0.76 mg/dL (ref 0.61–1.24)
GFR calc non Af Amer: >60 mL/min (ref >60)
Glucose, Bld: 93 mg/dL (ref 65–99)
POTASSIUM: 4 mmol/L (ref 3.5–5.1)
SODIUM: 137 mmol/L (ref 135–145)

## 2015-04-19 LAB — CULTURE, BLOOD (ROUTINE X 2): CULTURE: NO GROWTH

## 2015-04-19 LAB — BRAIN NATRIURETIC PEPTIDE: B Natriuretic Peptide: 152.4 pg/mL — ABNORMAL HIGH (ref 0.0–100.0)

## 2015-04-19 LAB — PHOSPHORUS: PHOSPHORUS: 3.7 mg/dL (ref 2.5–4.6)

## 2015-04-19 LAB — CULTURE, BLOOD (SINGLE): Culture: NO GROWTH

## 2015-04-19 LAB — MAGNESIUM: MAGNESIUM: 1.6 mg/dL — AB (ref 1.7–2.4)

## 2015-04-19 MED ORDER — DEXTROSE 5 % IV SOLN
4.0000 g | Freq: Once | INTRAVENOUS | Status: AC
  Administered 2015-04-19: 4 g via INTRAVENOUS
  Filled 2015-04-19: qty 8

## 2015-04-19 NOTE — Progress Notes (Addendum)
PULMONARY / CRITICAL CARE MEDICINE   Name: Douglas [name]. MRN: [id number] DOB: Apr 14, 1985    ADMISSION DATE:  04/14/2015 CONSULTATION DATE:  3/21  REFERRING MD:  Charlies Silvers   CHIEF COMPLAINT:  Acute on chronic resp failure   HISTORY OF PRESENT ILLNESS:   This is a 30 year old male w/ chronic resp failure in setting of severe OSA/OHS. Has h/o BIPAP non-compliance. Followed at the heart failure clinic for severe secondary PAH, Cor Pulmonale & diastolic dysfxn. Marland Kitchen His father who is at bedside reports BIPAP not been used in about a month & that he had been more somnolent than usual. Also noted some confusion on 3/20. He awoke nauseated and dizzy at 0400 on 3/21. Called EMS who reported he was not using his BIPAP. At 0400 on 3/21 he awoke feeling dizzy and nauseated. Vomited 3-4 times. Called EMS. On arrival was more lethargic. Placed on NIPPV. In spite of attempts w/ NIPPV he became progressively lethargic and hypercarbic (as well as hypoxic). His PO2 dropped in 60s and PCO2 was to high to evaluate. Because of this he was intubated & PCCM asked to assume care.    SUBJECTIVE:  Refused NIMV Told RN to get out of his face Neg 3.5 liters  VITAL SIGNS: BP 134/90 mmHg  Pulse 86  Temp(Src) 98.7 F (37.1 C) (Oral)  Resp 16  Ht 5\' 7"  (1.702 m)  Wt 128.4 kg (283 lb 1.1 oz)  BMI 44.32 kg/m2  SpO2 98%  HEMODYNAMICS:    VENTILATOR SETTINGS: Vent Mode:  [-] BIPAP FiO2 (%):  [40 %] 40 % Set Rate:  [10 bmp] 10 bmp  INTAKE / OUTPUT: I/O last 3 completed shifts: In: 1320 [P.O.:1320] Out: 4300 [Urine:4300]  PHYSICAL EXAMINATION: General:  Obese aam, no distress in bed Neuro:  Awakens easily, alert, no focal def  HEENT:  Neck is large and short. No JVD Cardiovascular:  Rrr, no MRG  Lungs: reduced but no active wheezing Abdomen:  Obese, no OM, + bowel sounds Musculoskeletal:  Equal st and bulk  Skin:  Warm and dry, LE chronic venous changes.   LABS:  BMET  Recent Labs Lab  04/17/15 0600 04/18/15 0345 04/19/15 0643  NA 138 139 137  K 4.0 4.2 4.0  CL 92* 91* 90*  CO2 38* 40* 40*  BUN 13 16 14   CREATININE 1.10 1.03 0.76  GLUCOSE 87 84 93    Electrolytes  Recent Labs Lab 04/16/15 0400 04/17/15 0600 04/18/15 0345 04/19/15 0643  CALCIUM 8.9 8.4* 8.6* 8.9  MG 1.6* 2.0  --  1.6*  PHOS 3.2 4.2  --  3.7    CBC  Recent Labs Lab 04/14/15 1645 04/15/15 0307 04/16/15 0400  WBC 18.7* 15.6* 15.7*  HGB 17.8* 17.4* 17.7*  HCT 61.5* 62.5* 59.5*  PLT 193 194 180    Coag's No results for input(s): APTT, INR in the last 168 hours.  Sepsis Markers  Recent Labs Lab 04/14/15 0619 04/15/15 0307 04/16/15 0400  LATICACIDVEN 1.21  --   --   PROCALCITON  --  0.17 0.12    ABG  Recent Labs Lab 04/14/15 1322 04/15/15 0500 04/16/15 0345  PHART 7.457* 7.569* 7.574*  PCO2ART 61.3* 49.2* 42.8  PO2ART 146* 158* 73.6*    Liver Enzymes  Recent Labs Lab 04/14/15 0608 04/14/15 1630  AST 39 31  ALT 15* 21  ALKPHOS 55 52  BILITOT 0.9 2.0*  ALBUMIN 2.9* 2.8*    Cardiac Enzymes  Recent Labs Lab  04/14/15 1630 04/14/15 2139 04/15/15 0307  TROPONINI <0.03 0.04* 0.06*    Glucose  Recent Labs Lab 04/15/15 1641 04/15/15 2022 04/15/15 2318 04/16/15 0350 04/16/15 0753 04/16/15 1226  GLUCAP 97 115* 103* 101* 114* 161*    Imaging Dg Chest Port 1 View  04/19/2015  CLINICAL DATA:  30 year old male with CHF and pulmonary edema. EXAM: PORTABLE CHEST 1 VIEW COMPARISON:  Radiograph dated 04/18/2015 FINDINGS: Single-view of the chest demonstrates stable cardiomegaly with mild central vascular prominence compatible with congestive changes. There has been slight interval improvement compared to prior study. There is no focal consolidation, pleural effusion, or pneumothorax. Left IJ central venous line in stable positioning. No acute osseous pathology. IMPRESSION: Cardiomegaly with mild congestive changes and slight overall improvement compared to  prior study. Electronically Signed   By: Anner Crete M.D.   On: 04/19/2015 05:21   Dg Chest Port 1 View  04/18/2015  CLINICAL DATA:  Shortness of Breath EXAM: PORTABLE CHEST 1 VIEW COMPARISON:  04/16/2015 FINDINGS: Cardiomegaly again noted. Endotracheal tube has been removed. No segmental infiltrate or pulmonary edema. Mild basilar atelectasis. Stable left IJ central line with tip in mid SVC. No pneumothorax. IMPRESSION: Cardiomegaly again noted. Mild basilar atelectasis without segmental infiltrate. No pulmonary edema. Electronically Signed   By: Lahoma Crocker M.D.   On: 04/18/2015 11:18    STUDIES:   CULTURES: Influenza PCR 3/21>>>neg Sputum culture 3/21>>> NPF BC x 2 3/21>>>  ANTIBIOTICS: unasyn 3/21>>>3/24 azithro 3/21>>>3/23  SIGNIFICANT EVENTS: 3/25- back to NIMV 3/26- refusing NIMV, was neg 3.5 liters  LINES/TUBES: OETT 3/21 (difficulty airway)>>>3/23 3/21 rt i j cvl>>  ASSESSMENT / PLAN:  Acute on Chronic Hypercarbic and Hypoxic respiratory failure in setting of decompensated OHS/OSA w/ secondary PAH resulting in decompensated Cor Pulmonale and diastolic dysfxn.  DIFFICULT AIRWAY  BIPAP noncompliance  ->extubated 3/23 Plan:   Increase lasix succesful pcxr also in am  Schedule NIMV not realistic and not needed today, will pray he can use at night and counsel him on importance  Secondary PAH Cor pulmonale Acute on Chronic Diastolic Heart Failure Plan:  BP control afterload, sys less 140 goal Lasix increase did well, maintain  hypomag supp mag Chem, in am with lasix maintain  Obesity  Nausea/vomiting Elevated ammonia ? Hepatic congestion  Plan:   PPI for SUP, dc if not home med Diet  Polycythemia in setting of chronic hypoxia  Plan:  [place] heparin   Acute hypercarbic Encephalopathy-->resolved  Plan:   Lactulose for hyperammonemia and BM  Consider move to floor  Lavon Paganini. Titus Mould, MD, [place] Pgr: [facility]

## 2015-04-19 NOTE — Plan of Care (Signed)
Problem: Phase II Progression Outcomes Goal: Date pt extubated/weaned off vent Outcome: Completed/Met Date Met:  04/19/15 04/16/2015 Goal: Time pt extubated/weaned off vent Outcome: Completed/Met Date Met:  04/19/15 0800

## 2015-04-20 DIAGNOSIS — I2781 Cor pulmonale (chronic): Secondary | ICD-10-CM

## 2015-04-20 LAB — MAGNESIUM: Magnesium: 1.9 mg/dL (ref 1.7–2.4)

## 2015-04-20 LAB — BASIC METABOLIC PANEL
Anion gap: 7 (ref 5–15)
BUN: 14 mg/dL (ref 6–20)
CHLORIDE: 87 mmol/L — AB (ref 101–111)
CO2: 42 mmol/L — AB (ref 22–32)
Calcium: 8.7 mg/dL — ABNORMAL LOW (ref 8.9–10.3)
Creatinine, Ser: 0.72 mg/dL (ref 0.61–1.24)
GFR calc Af Amer: >60 mL/min (ref >60)
GFR calc non Af Amer: >60 mL/min (ref >60)
GLUCOSE: 86 mg/dL (ref 65–99)
Potassium: 3.9 mmol/L (ref 3.5–5.1)
Sodium: 136 mmol/L (ref 135–145)

## 2015-04-20 LAB — PHOSPHORUS: Phosphorus: 2.9 mg/dL (ref 2.5–4.6)

## 2015-04-20 MED ORDER — FUROSEMIDE 40 MG PO TABS
80.0000 mg | ORAL_TABLET | Freq: Two times a day (BID) | ORAL | Status: DC
  Administered 2015-04-20 – 2015-04-28 (×16): 80 mg via ORAL
  Filled 2015-04-20 (×16): qty 2

## 2015-04-20 MED ORDER — LISINOPRIL 10 MG PO TABS
5.0000 mg | ORAL_TABLET | Freq: Two times a day (BID) | ORAL | Status: DC
  Administered 2015-04-20 – 2015-04-28 (×17): 5 mg via ORAL
  Filled 2015-04-20: qty 1
  Filled 2015-04-20 (×6): qty 2
  Filled 2015-04-20: qty 1
  Filled 2015-04-20: qty 2
  Filled 2015-04-20: qty 1
  Filled 2015-04-20 (×2): qty 2
  Filled 2015-04-20: qty 1
  Filled 2015-04-20 (×4): qty 2

## 2015-04-20 NOTE — Progress Notes (Signed)
Nutrition Follow-up  DOCUMENTATION CODES:   Obesity unspecified  INTERVENTION:   - Will continue to monitor for any additional nutrition needs.  NUTRITION DIAGNOSIS:   No new diagnosis at this time  GOAL:   Patient will meet greater than or equal to 90% of their needs  Currently met  MONITOR:   PO intake, Labs, Weight trends, Skin, I & O's  ASSESSMENT:   30 -year-old male with past medical history of OHS/OSA, noncompliant with BiPAP, morbid obesity, HTN, diastolic CHF with prominent RV failure. He follows in heart failure clinic. He takes Lasix 80 mg twice daily per cardiology recommendation. He presented to [facility] with reports of feeling dizzy and nauseous and having vomiting earlier this morning.  Patient extubated on 3/23.  Patient reports that PTA he was not eating much due to nausea and vomiting x 1 day.  States that even prior to the episodes of nausea/vomiting he was not eating much due to a decreased appetite.  Currently patient reports that he has been eating all of his meals and that he is back to a regular appetite.  Denies any nausea since he started a regular diet.  Per chart review, patient with 100% meal completion.    Patient reports that his weight has remained stable, stating his usual body weight is around 165#.  Weight stable per chart review.   Patient likely meeting needs with meals.  Declines offer for supplement or snacks.  Medications reviewed: K-Dur.  Labs reviewed.  Diet Order:  Diet regular Room service appropriate?: Yes; Fluid consistency:: Thin  Skin:  Reviewed, no issues  Last BM:  3/25  Height:   Ht Readings from Last 1 Encounters:  04/14/15 '5\' 7"'  (1.702 m)    Weight:   Wt Readings from Last 1 Encounters:  04/20/15 272 lb 4.3 oz (123.5 kg)    Ideal Body Weight:  67.27 kg  BMI:  Body mass index is 42.63 kg/(m^2).  Estimated Nutritional Needs:   Kcal:  2200-2400  Protein:  115-130 grams  Fluid:  >/= 2  L  EDUCATION NEEDS:   No education needs identified at this time  Veronda Prude, Dietetic Intern Pager: [phone]

## 2015-04-20 NOTE — Progress Notes (Signed)
Date:  April 20, 2015 Chart reviewed for concurrent status and case management needs. Will continue to follow patient for changes and needs: iv Fentanyl drip [name], BSN, [place], [place]   [phone]

## 2015-04-20 NOTE — Progress Notes (Signed)
RT adjusted bipap settings for comfort of pt.  Rt will monitor.

## 2015-04-20 NOTE — Telephone Encounter (Signed)
Ria Comment, I advised triage Friday morning that it was coming via fax. Not sure where it would have gone, as I have not seen it.

## 2015-04-20 NOTE — Progress Notes (Signed)
PULMONARY / CRITICAL CARE MEDICINE   Name: Douglas [name]. MRN: [id number] DOB: 09-15-1985    ADMISSION DATE:  04/14/2015 CONSULTATION DATE:  3/21  REFERRING MD:  Charlies Silvers   CHIEF COMPLAINT:  Acute on chronic resp failure   HISTORY OF PRESENT ILLNESS:   This is a 30 year old male w/ chronic resp failure in setting of severe OSA/OHS. Has h/o BIPAP non-compliance. Followed at the heart failure clinic for severe secondary PAH, Cor Pulmonale & diastolic dysfxn. Marland Kitchen His father who is at bedside reports BIPAP not been used in about a month & that he had been more somnolent than usual. Also noted some confusion on 3/20. He awoke nauseated and dizzy at 0400 on 3/21. Called EMS who reported he was not using his BIPAP. At 0400 on 3/21 he awoke feeling dizzy and nauseated. Vomited 3-4 times. Called EMS. On arrival was more lethargic. Placed on NIPPV. In spite of attempts w/ NIPPV he became progressively lethargic and hypercarbic (as well as hypoxic). His PO2 dropped in 60s and PCO2 was to high to evaluate. Because of this he was intubated & PCCM asked to assume care.    SUBJECTIVE:  Desaturated overnight, BiPAP placed and he wore it for an hour, then refused. He states this am thathe understands the importance of wearing NIPPV, is willing to try to do better  VITAL SIGNS: BP 159/109 mmHg  Pulse 83  Temp(Src) 98.3 F (36.8 C) (Axillary)  Resp 17  Ht 5\' 7"  (1.702 m)  Wt 123.5 kg (272 lb 4.3 oz)  BMI 42.63 kg/m2  SpO2 100%  HEMODYNAMICS:    VENTILATOR SETTINGS: Vent Mode:  [-] BIPAP FiO2 (%):  [40 %] 40 % Set Rate:  [12 bmp] 12 bmp  INTAKE / OUTPUT: I/O last 3 completed shifts: In: 480 [P.O.:480] Out: 5250 [Urine:5250]  PHYSICAL EXAMINATION: General:  Obese aam, no distress in chair Neuro:  Awake, appropriate  HEENT:  Neck is large and short. No JVD Cardiovascular:  Rrr, no MRG  Lungs: reduced but no wheezing Abdomen:  Obese, no OM, + bowel sounds Musculoskeletal:  Equal st and  bulk  Skin:  Warm and dry, LE chronic venous changes.   LABS:  BMET  Recent Labs Lab 04/18/15 0345 04/19/15 0643 04/20/15 0315  NA 139 137 136  K 4.2 4.0 3.9  CL 91* 90* 87*  CO2 40* 40* 42*  BUN 16 14 14   CREATININE 1.03 0.76 0.72  GLUCOSE 84 93 86    Electrolytes  Recent Labs Lab 04/17/15 0600 04/18/15 0345 04/19/15 0643 04/20/15 0315  CALCIUM 8.4* 8.6* 8.9 8.7*  MG 2.0  --  1.6* 1.9  PHOS 4.2  --  3.7 2.9    CBC  Recent Labs Lab 04/15/15 0307 04/16/15 0400 04/19/15 0643  WBC 15.6* 15.7* 7.4  HGB 17.4* 17.7* 17.0  HCT 62.5* 59.5* 60.3*  PLT 194 180 147*    Coag's No results for input(s): APTT, INR in the last 168 hours.  Sepsis Markers  Recent Labs Lab 04/14/15 0619 04/15/15 0307 04/16/15 0400  LATICACIDVEN 1.21  --   --   PROCALCITON  --  0.17 0.12    ABG  Recent Labs Lab 04/14/15 1322 04/15/15 0500 04/16/15 0345  PHART 7.457* 7.569* 7.574*  PCO2ART 61.3* 49.2* 42.8  PO2ART 146* 158* 73.6*    Liver Enzymes  Recent Labs Lab 04/14/15 0608 04/14/15 1630  AST 39 31  ALT 15* 21  ALKPHOS 55 52  BILITOT 0.9 2.0*  ALBUMIN 2.9* 2.8*    Cardiac Enzymes  Recent Labs Lab 04/14/15 1630 04/14/15 2139 04/15/15 0307  TROPONINI <0.03 0.04* 0.06*    Glucose  Recent Labs Lab 04/15/15 1641 04/15/15 2022 04/15/15 2318 04/16/15 0350 04/16/15 0753 04/16/15 1226  GLUCAP 97 115* 103* 101* 114* 161*    Imaging No results found.  STUDIES:   CULTURES: Influenza PCR 3/21>>>neg Sputum culture 3/21>>> NPF BC x 2 3/21>>>  ANTIBIOTICS: unasyn 3/21>>>3/24 azithro 3/21>>>3/23  SIGNIFICANT EVENTS: 3/25- back to NIMV 3/26- refusing NIMV, was neg 3.5 liters  LINES/TUBES: OETT 3/21 (difficulty airway)>>>3/23 3/21 rt i j cvl>>  ASSESSMENT / PLAN:  Acute on Chronic Hypercarbic and Hypoxic respiratory failure in setting of decompensated OHS/OSA w/ secondary PAH resulting in decompensated Cor Pulmonale and diastolic dysfxn.   DIFFICULT AIRWAY  BIPAP noncompliance  ->extubated 3/23 Plan:   Increase lasix successful, good diuresis last 3-4 days. Change to home PO dosing 3/27 Schedule NIMV not realistic and not needed today, will pray he can use at night and counsel him on importance  Secondary PAH Cor pulmonale Acute on Chronic Diastolic Heart Failure Plan:  Add back home lasix 3/27, home lisinopril 3/27 Holding coreg for now Follow BMP  Obesity  Nausea/vomiting Elevated ammonia ? Hepatic congestion  Plan:   Diet  Polycythemia in setting of chronic hypoxia  Plan:  [place] heparin   Acute hypercarbic Encephalopathy-->resolved  Plan:   Lactulose for hyperammonemia and BM  One more day in SDU to work on BiPAP compliance  Baltazar Apo, MD, PhD 04/20/2015, 10:24 AM [facility] [phone] or if no answer [phone]

## 2015-04-20 NOTE — Telephone Encounter (Signed)
Attempted to call [employer] to get download as we have not received. No answer. Will try back.

## 2015-04-20 NOTE — Telephone Encounter (Signed)
This download is not located in AD's look at.  Sheena - do you have this? Do we know if AD has this?  Thanks.

## 2015-04-21 NOTE — Progress Notes (Signed)
PULMONARY / CRITICAL CARE MEDICINE   Name: Douglas [name]. MRN: [id number] DOB: 1985/09/27    ADMISSION DATE:  04/14/2015 CONSULTATION DATE:  3/21  REFERRING MD:  Charlies Silvers   CHIEF COMPLAINT:  Acute on chronic resp failure   HISTORY OF PRESENT ILLNESS:   This is a 30 year old male w/ chronic resp failure in setting of severe OSA/OHS. Has h/o BIPAP non-compliance. Followed at the heart failure clinic for severe secondary PAH, Cor Pulmonale & diastolic dysfxn. Marland Kitchen His father who is at bedside reports BIPAP not been used in about a month & that he had been more somnolent than usual. Also noted some confusion on 3/20. He awoke nauseated and dizzy at 0400 on 3/21. Called EMS who reported he was not using his BIPAP. At 0400 on 3/21 he awoke feeling dizzy and nauseated. Vomited 3-4 times. Called EMS. On arrival was more lethargic. Placed on NIPPV. In spite of attempts w/ NIPPV he became progressively lethargic and hypercarbic (as well as hypoxic). His PO2 dropped in 60s and PCO2 was to high to evaluate. Because of this he was intubated & PCCM asked to assume care.    SUBJECTIVE:  Patient wore BIPAP for 4 hours and then woke up in the middle of the night and refused to wear it any longer, de-sated into the 60s. Patient educated on importance by nursing staff but continued to refuse to wear.   This morning patient stated that he took BIPAP off to have a break and after an hour had them place it back on - he states he is feeling more comfortable with the BIPAP then he did the night before and thinks it will just take time to get use to it.   VITAL SIGNS: BP 120/68 mmHg  Pulse 89  Temp(Src) 98 F (36.7 C) (Oral)  Resp 15  Ht 5\' 7"  (1.702 m)  Wt 271 lb 9.7 oz (123.2 kg)  BMI 42.53 kg/m2  SpO2 90%  HEMODYNAMICS:    VENTILATOR SETTINGS: Vent Mode:  [-] BIPAP;PCV FiO2 (%):  [28 %] 28 % Set Rate:  [12 bmp] 12 bmp PEEP:  [5 cmH20] 5 cmH20  INTAKE / OUTPUT: I/O last 3 completed shifts: In:  243 [P.O.:240; I.V.:3] Out: 2100 [Urine:2100]  PHYSICAL EXAMINATION: General:  Obese aam, no distress in chair Neuro:  Awake, appropriate  HEENT:  Neck is large and short. No JVD Cardiovascular:  Rrr, no MRG  Lungs: reduced but no wheezing, no use of accessory muscles  Abdomen:  Obese, no OM, + bowel sounds Musculoskeletal:  Equal st and bulk  Skin:  Warm and dry, LE chronic venous changes.   LABS:  BMET  Recent Labs Lab 04/18/15 0345 04/19/15 0643 04/20/15 0315  NA 139 137 136  K 4.2 4.0 3.9  CL 91* 90* 87*  CO2 40* 40* 42*  BUN 16 14 14   CREATININE 1.03 0.76 0.72  GLUCOSE 84 93 86    Electrolytes  Recent Labs Lab 04/17/15 0600 04/18/15 0345 04/19/15 0643 04/20/15 0315  CALCIUM 8.4* 8.6* 8.9 8.7*  MG 2.0  --  1.6* 1.9  PHOS 4.2  --  3.7 2.9    CBC  Recent Labs Lab 04/15/15 0307 04/16/15 0400 04/19/15 0643  WBC 15.6* 15.7* 7.4  HGB 17.4* 17.7* 17.0  HCT 62.5* 59.5* 60.3*  PLT 194 180 147*    Coag's No results for input(s): APTT, INR in the last 168 hours.  Sepsis Markers  Recent Labs Lab 04/15/15 0307 04/16/15  0400  PROCALCITON 0.17 0.12    ABG  Recent Labs Lab 04/14/15 1322 04/15/15 0500 04/16/15 0345  PHART 7.457* 7.569* 7.574*  PCO2ART 61.3* 49.2* 42.8  PO2ART 146* 158* 73.6*    Liver Enzymes  Recent Labs Lab 04/14/15 1630  AST 31  ALT 21  ALKPHOS 52  BILITOT 2.0*  ALBUMIN 2.8*    Cardiac Enzymes  Recent Labs Lab 04/14/15 1630 04/14/15 2139 04/15/15 0307  TROPONINI <0.03 0.04* 0.06*    Glucose  Recent Labs Lab 04/15/15 1641 04/15/15 2022 04/15/15 2318 04/16/15 0350 04/16/15 0753 04/16/15 1226  GLUCAP 97 115* 103* 101* 114* 161*    Imaging No results found.  STUDIES:   CULTURES: Influenza PCR 3/21>>>neg Sputum culture 3/21>>> NPF BC x 2 3/21>>>neg  ANTIBIOTICS: unasyn 3/21>>>3/24 azithro 3/21>>>3/23  SIGNIFICANT EVENTS: 3/25- back to NIMV 3/26- refusing NIMV, was neg 3.5 liters 3/27  - wore NIMV for 4 hours, refused to wear any longer   LINES/TUBES: OETT 3/21 (difficulty airway)>>>3/23 3/21 rt i j cvl>>3/27  ASSESSMENT / PLAN:  Acute on Chronic Hypercarbic and Hypoxic respiratory failure in setting of decompensated OHS/OSA w/ secondary PAH resulting in decompensated Cor Pulmonale and diastolic dysfxn.  DIFFICULT AIRWAY  BIPAP noncompliance  ->extubated 3/23 Plan:   Lasix changed to home dose 3/27 - remains net negative  Continue to educate on NIMV importance. Suspect that he will ultimately require trach unless he is able to dramatically change compliance pattern   Secondary PAH Cor pulmonale Acute on Chronic Diastolic Heart Failure Plan:  Add back home lasix 3/27, home lisinopril 3/27 Home coreg and lisinopril  Follow BMP  Obesity  Nausea/vomiting Elevated ammonia ? Hepatic congestion  Plan:   Diet  Polycythemia in setting of chronic hypoxia  Plan:  [place] heparin   Acute hypercarbic Encephalopathy-->resolved  Plan:   Lactulose for hyperammonemia and BM  Possible Transfer to floor soon but note severe hypoxemia when sleeping without BiPAP. Patient understands importance of BIPAP and states he is more comfortable with it.   Erick Colace ACNP-BC [place] Pager # [phone] OR # [phone] if no answer  Attending Note:  I have examined patient, reviewed labs, studies and notes. I have discussed the case with Jerrye Bushy and Beaulah Corin, and I agree with the data and plans as amended above.   Baltazar Apo, MD, PhD 04/21/2015, 11:20 AM [facility] [phone] or if no answer [phone]

## 2015-04-21 NOTE — Progress Notes (Signed)
Pt want BIPAP on between 2300-2330.  Pt to notify RT if he decides otherwise, RT to monitor and assess as needed.

## 2015-04-21 NOTE — Progress Notes (Signed)
At 0300, pt took off bipap mask, after wearing for 4 hours. Pt educated on importance of bipap, but continues to refuse.  Placed on 3L . RT notified. Sp02 currently 95%. Will continue to monitor.

## 2015-04-21 NOTE — Telephone Encounter (Signed)
Spoke with Arbie Cookey at [facility] and requested download be refaxed.  Download received and given to [facility].  Will forward message to her for f/u.

## 2015-04-21 NOTE — Progress Notes (Signed)
Pt wanting to finish watching TV program before going on BIPAP.  Pt wants to go on at midnight for now, RT to monitor and assess as needed.

## 2015-04-21 NOTE — Telephone Encounter (Signed)
Per download from [place], pt has only used BiPAP for 175 out of 365 days of the past year to date. Pt has long history of non-compliance with machine. Pt claimed machine was broken last year and received a new one 3/16. [facility] is not aware of any machine complications and pt has not contacted them with any complaints/concerns. Please advise.

## 2015-04-22 NOTE — Telephone Encounter (Signed)
Thanks we are aware. He is an inpatient at [facility] ICU

## 2015-04-22 NOTE — Progress Notes (Signed)
Pt remains on BIPAP/NIV due to still sleeping this am.

## 2015-04-22 NOTE — Progress Notes (Signed)
PULMONARY / CRITICAL CARE MEDICINE   Name: Furious Writer. MRN: [id number] DOB: [birth year]    ADMISSION DATE:  04/14/2015 CONSULTATION DATE:  3/21  REFERRING MD:  Charlies Silvers   CHIEF COMPLAINT:  Acute on chronic resp failure   HISTORY OF PRESENT ILLNESS:   This is a 30 year old male w/ chronic resp failure in setting of severe OSA/OHS. Has h/o BIPAP non-compliance. Followed at the heart failure clinic for severe secondary PAH, Cor Pulmonale & diastolic dysfxn. Marland Kitchen His father who is at bedside reports BIPAP not been used in about a month & that he had been more somnolent than usual. Also noted some confusion on 3/20. He awoke nauseated and dizzy at 0400 on 3/21. Called EMS who reported he was not using his BIPAP. At 0400 on 3/21 he awoke feeling dizzy and nauseated. Vomited 3-4 times. Called EMS. On arrival was more lethargic. Placed on NIPPV. In spite of attempts w/ NIPPV he became progressively lethargic and hypercarbic (as well as hypoxic). His PO2 dropped in 60s and PCO2 was to high to evaluate. Because of this he was intubated & PCCM asked to assume care.    SUBJECTIVE:  Patient wore BIPAP from midnight to 7am without problems. Patient states that he feels more comfortable wearing the BIPAP and understands the risk of not wearing it. Also now asking about trach & seems more and more open to it   VITAL SIGNS: BP 123/70 mmHg  Pulse 71  Temp(Src) 98.2 F (36.8 C) (Axillary)  Resp 17  Ht 5\' 7"  (1.702 m)  Wt 271 lb 9.7 oz (123.2 kg)  BMI 42.53 kg/m2  SpO2 99%  HEMODYNAMICS:    VENTILATOR SETTINGS: Vent Mode:  [-] BIPAP FiO2 (%):  [28 %] 28 % Set Rate:  [15 bmp] 15 bmp PEEP:  [5 cmH20-10 cmH20] 10 cmH20  INTAKE / OUTPUT: I/O last 3 completed shifts: In: 400 [P.O.:400] Out: 1800 [Urine:1800]  PHYSICAL EXAMINATION: General:  Obese aam, no distress, resting in bed with BIPAP on  Neuro:  Awake, appropriate  HEENT:  Neck is large and short. No JVD Cardiovascular:  Rrr, no MRG   Lungs: reduced but no wheezing, no use of accessory muscles  Abdomen:  Obese, no OM, + bowel sounds Musculoskeletal:  Equal st and bulk  Skin:  Warm and dry, LE chronic venous changes.   LABS:  BMET  Recent Labs Lab 04/18/15 0345 04/19/15 0643 04/20/15 0315  NA 139 137 136  K 4.2 4.0 3.9  CL 91* 90* 87*  CO2 40* 40* 42*  BUN 16 14 14   CREATININE 1.03 0.76 0.72  GLUCOSE 84 93 86    Electrolytes  Recent Labs Lab 04/17/15 0600 04/18/15 0345 04/19/15 0643 04/20/15 0315  CALCIUM 8.4* 8.6* 8.9 8.7*  MG 2.0  --  1.6* 1.9  PHOS 4.2  --  3.7 2.9    CBC  Recent Labs Lab 04/16/15 0400 04/19/15 0643  WBC 15.7* 7.4  HGB 17.7* 17.0  HCT 59.5* 60.3*  PLT 180 147*    Coag's No results for input(s): APTT, INR in the last 168 hours.  Sepsis Markers  Recent Labs Lab 04/16/15 0400  PROCALCITON 0.12    ABG  Recent Labs Lab 04/16/15 0345  PHART 7.574*  PCO2ART 42.8  PO2ART 73.6*    Liver Enzymes No results for input(s): AST, ALT, ALKPHOS, BILITOT, ALBUMIN in the last 168 hours.  Cardiac Enzymes No results for input(s): TROPONINI, PROBNP in the last 168 hours.  Glucose  Recent Labs Lab 04/15/15 1641 04/15/15 2022 04/15/15 2318 04/16/15 0350 04/16/15 0753 04/16/15 1226  GLUCAP 97 115* 103* 101* 114* 161*    Imaging No results found.  STUDIES:   CULTURES: Influenza PCR 3/21>>>neg Sputum culture 3/21>>> NPF BC x 2 3/21>>>neg  ANTIBIOTICS: unasyn 3/21>>>3/24 azithro 3/21>>>3/23  SIGNIFICANT EVENTS: 3/25- back to NIMV 3/26- refusing NIMV, was neg 3.5 liters 3/27 - wore NIMV for 4 hours, refused to wear any longer  3/28 - wore NIMV all night  LINES/TUBES: OETT 3/21 (difficulty airway)>>>3/23 3/21 rt i j cvl>>3/27  ASSESSMENT / PLAN:  Acute on Chronic Hypercarbic and Hypoxic respiratory failure in setting of decompensated OHS/OSA w/ secondary PAH resulting in decompensated Cor Pulmonale and diastolic dysfxn.  DIFFICULT AIRWAY   BIPAP noncompliance (only used his BIPAP 175 out of 365 days last year) ->extubated 3/23 Plan:   Lasix changed to home dose 3/27 - remains net negative  Continue O2 at baseline 4-6 liters Continue to educate on NIMV importance. Suspect that he will ultimately require trach unless he is able to dramatically change compliance pattern -->he is now more open to the idea of trach Do not think he can go to the medical floor yet    Secondary PAH Cor pulmonale Acute on Chronic Diastolic Heart Failure Plan:  Add back home lasix 3/27, home lisinopril 3/27 Home coreg and lisinopril  Follow BMP  Obesity  Nausea/vomiting Elevated ammonia ? Hepatic congestion  Plan:   Diet  Polycythemia in setting of chronic hypoxia  Plan:  [place] heparin   Acute hypercarbic Encephalopathy-->resolved  Plan:   Lactulose for hyperammonemia and BM  Continue to monitor. Really think best option is trach. He is more open to this. We will cont to provide supportive care at this point. Will revisit trach possibility a little later today. If he decides to go forward will ask ENT to see him   Erick Colace ACNP-BC [place] Pager # [phone] OR # [phone] if no answer   Attending Note:  I have examined patient, reviewed labs, studies and notes. I have discussed the case with Jerrye Bushy, and I agree with the data and plans as amended above. Chronic hypoxemic and hypercapneic resp failure due to OSA / OHS. He is working on improving BiPAP compliance. Suspect he may still ultimately require trach. He is discussing this with family also. I don't believe discharge home at this time with marginal BiPAP compliance is safe.   Baltazar Apo, MD, PhD 04/22/2015, 12:28 PM [facility] [phone] or if no answer [phone]

## 2015-04-22 NOTE — Telephone Encounter (Signed)
  RB and PB -- pls read email re: Raynelle Chary. Thanks.  AD

## 2015-04-23 NOTE — Progress Notes (Signed)
Pt off of black box bipap and remains on 2 LPM [place].

## 2015-04-23 NOTE — Consult Note (Signed)
Reason for Consult:hypoventilation syndrome Referring Physician: pulmonary  Douglas [name]. is an 30 y.o. male.  HPI: he has significant OSA with now with bipap. He desaturates and increase Co2  to the point of being obtunded. He has CHFand cor pulmonale. He is not controlled with sleep apnea on bipap bc of noncompliance and pulmonary has called for a tracheotomy. The patient agrees  Past Medical History  Diagnosis Date  . Hypertension   . Obesity   . Tobacco abuse   . Substance abuse 8/31/213    cociane "first time use"  . High cholesterol   . CHF (congestive heart failure) ([place])   . Chronic bronchitis ([place])   . OSA on CPAP   . Arthritis     "hands, feet" (10/31/2013)  . Pneumonia 09/2011    Past Surgical History  Procedure Laterality Date  . No past surgeries    . Tubes in ears      Family History  Problem Relation Age of Onset  . Heart disease Mother     Pacemaker  . Heart attack Neg Hx   . Stroke Neg Hx   . Hypertension Father   . Cancer Paternal Grandfather     Social History:  reports that he quit smoking about 17 months ago. His smoking use included Cigarettes. He has a 4.5 pack-year smoking history. He has never used smokeless tobacco. He reports that he uses illicit drugs (Cocaine). He reports that he does not drink alcohol.  Allergies: No Known Allergies  Medications: I have reviewed the patient's current medications.  No results found for this or any previous visit (from the past 48 hour(s)).  No results found.  ROS Blood pressure 122/69, pulse 85, temperature 98.5 F (36.9 C), temperature source Oral, resp. rate 13, height 5\' 7"  (1.702 m), weight 123.2 kg (271 lb 9.7 oz), SpO2 92 %. Physical Exam  Constitutional: He appears well-developed and well-nourished.  HENT:  Head: Normocephalic and atraumatic.  Nose: Nose normal.  Mouth/Throat: Oropharynx is clear and moist.  Eyes: Conjunctivae are normal. Pupils are equal, round, and reactive to  light.  Neck:  Neck is not significantly thick and anatomy is palpable.     Assessment/Plan: Obstructive sleep apnea/chf/cor pulmonale- he as been noncompliant with CPAP and has severe OSA. He has agreed that a trach would be best for him since he will not wear the CPAP. We discussed the tracheotomy and risks, benefits and options. All questions were answered and consent obtained. He will be scheduled at [facility] next week depending on time at [facility].   Melissa Montane 04/23/2015, 3:51 PM

## 2015-04-23 NOTE — Progress Notes (Signed)
PULMONARY / CRITICAL CARE MEDICINE   Name: Douglas [name]. MRN: [id number] DOB: 1985-12-29    ADMISSION DATE:  04/14/2015 CONSULTATION DATE:  3/21  REFERRING MD:  Charlies Silvers   CHIEF COMPLAINT:  Acute on chronic resp failure   HISTORY OF PRESENT ILLNESS:   This is a 30 year old male w/ chronic resp failure in setting of severe OSA/OHS. Has h/o BIPAP non-compliance. Followed at the heart failure clinic for severe secondary PAH, Cor Pulmonale & diastolic dysfxn. Marland Kitchen His father who is at bedside reports BIPAP not been used in about a month & that he had been more somnolent than usual. Also noted some confusion on 3/20. He awoke nauseated and dizzy at 0400 on 3/21. Called EMS who reported he was not using his BIPAP. At 0400 on 3/21 he awoke feeling dizzy and nauseated. Vomited 3-4 times. Called EMS. On arrival was more lethargic. Placed on NIPPV. In spite of attempts w/ NIPPV he became progressively lethargic and hypercarbic (as well as hypoxic). His PO2 dropped in 60s and PCO2 was to high to evaluate. Because of this he was intubated & PCCM asked to assume care.    SUBJECTIVE:  Wore bipap overnight from 1130pm to 6am.  Thinks he wants to proceed with trach.   VITAL SIGNS: BP 154/119 mmHg  Pulse 78  Temp(Src) 97.8 F (36.6 C) (Oral)  Resp 15  Ht 5\' 7"  (1.702 m)  Wt 123.2 kg (271 lb 9.7 oz)  BMI 42.53 kg/m2  SpO2 97%  HEMODYNAMICS:    VENTILATOR SETTINGS:    INTAKE / OUTPUT: I/O last 3 completed shifts: In: 840 [P.O.:840] Out: 800 [Urine:800]  PHYSICAL EXAMINATION: General:  Obese aam, no distress in chair Neuro:  Awake, appropriate  HEENT:  Neck is large and short. No JVD Cardiovascular:  Rrr, no MRG  Lungs: resps even non labored on RA, reduced but no wheezing, no use of accessory muscles  Abdomen:  Obese, no OM, + bowel sounds Musculoskeletal:  Equal st and bulk  Skin:  Warm and dry, LE chronic venous changes.   LABS:  BMET  Recent Labs Lab 04/18/15 0345  04/19/15 0643 04/20/15 0315  NA 139 137 136  K 4.2 4.0 3.9  CL 91* 90* 87*  CO2 40* 40* 42*  BUN 16 14 14   CREATININE 1.03 0.76 0.72  GLUCOSE 84 93 86    Electrolytes  Recent Labs Lab 04/17/15 0600 04/18/15 0345 04/19/15 0643 04/20/15 0315  CALCIUM 8.4* 8.6* 8.9 8.7*  MG 2.0  --  1.6* 1.9  PHOS 4.2  --  3.7 2.9    CBC  Recent Labs Lab 04/19/15 0643  WBC 7.4  HGB 17.0  HCT 60.3*  PLT 147*    Coag's No results for input(s): APTT, INR in the last 168 hours.  Sepsis Markers No results for input(s): LATICACIDVEN, PROCALCITON, O2SATVEN in the last 168 hours.  ABG No results for input(s): PHART, PCO2ART, PO2ART in the last 168 hours.  Liver Enzymes No results for input(s): AST, ALT, ALKPHOS, BILITOT, ALBUMIN in the last 168 hours.  Cardiac Enzymes No results for input(s): TROPONINI, PROBNP in the last 168 hours.  Glucose  Recent Labs Lab 04/16/15 1226  GLUCAP 161*    Imaging No results found.  STUDIES:   CULTURES: Influenza PCR 3/21>>>neg Sputum culture 3/21>>> NPF BC x 2 3/21>>>neg  ANTIBIOTICS: unasyn 3/21>>>3/24 azithro 3/21>>>3/23  SIGNIFICANT EVENTS: 3/25- back to NIMV 3/26- refusing NIMV, was neg 3.5 liters 3/27 - wore NIMV for  4 hours, refused to wear any longer  3/30 - wore NIMV for 7 hrs  LINES/TUBES: OETT 3/21 (difficulty airway)>>>3/23 3/21 rt i j cvl>>3/27  ASSESSMENT / PLAN:  Acute on Chronic Hypercarbic and Hypoxic respiratory failure in setting of decompensated OHS/OSA w/ secondary PAH resulting in decompensated Cor Pulmonale and diastolic dysfxn.  DIFFICULT AIRWAY  BIPAP noncompliance  ->extubated 3/23 Plan:   Continue lasix at home dose - 80mg  BID  He has been more compliant with bipap the last 2 nights, however still highly doubt that he will be reliably compliant at home and suspect that trach is the safest option for him - discussed at length and he is agreeable to trach at this point  Supplemental O2 as needed   Pulmonary hygiene  Continue qhs bipap until trach  Will consult ENT   Secondary PAH Cor pulmonale Acute on Chronic Diastolic Heart Failure - present on admission  Plan:  Cont home lasix, lisinopril, coreg  Follow BMP  Obesity  Nausea/vomiting Elevated ammonia ? Hepatic congestion  Plan:   Diet  Polycythemia in setting of chronic hypoxia  Plan:  [place] heparin   Acute hypercarbic Encephalopathy-->resolved  Plan:   Lactulose for hyperammonemia and BM  Possible Transfer to floor soon but note severe hypoxemia when sleeping without BiPAP. Keep SD status for now and anticipate trach placement soon.    Nickolas Madrid, NP 04/23/2015  10:43 AM Pager: [phone] or [phone]  Attending Note:  I have examined patient, reviewed labs, studies and notes. I have discussed the case with Shon Millet, and I agree with the data and plans as amended above. OSA / OHS with hypoxemia and hypercapnia. He is working hard to use BiPAp but believes that best longterm solution is trach. I agree with this plan. We consulted ENT and I spoke with Dr Janace Hoard today. Plan will be to transfer to [facility] for elective trach once this can be scheduled. We will continue BiPAP qhs in the interim.    Baltazar Apo, MD, PhD 04/23/2015, 10:58 AM [place] [phone] or if no answer [phone]

## 2015-04-23 NOTE — Care Management Note (Signed)
Case Management Note  Patient Details  Name: Douglas [name]. MRN: [id number] Date of Birth: 07-12-1985  Subjective/Objective:  Bipap HS, agreeable to Trach.                  Action/Plan:d/c plan home.   Expected Discharge Date:   (unknown)               Expected Discharge Plan:  Home/Self Care  In-House Referral:     Discharge planning Services  CM Consult  Post Acute Care Choice:    Choice offered to:     DME Arranged:    DME Agency:     HH Arranged:    HH Agency:     Status of Service:  In process, will continue to follow  Medicare Important Message Given:    Date Medicare IM Given:    Medicare IM give by:    Date Additional Medicare IM Given:    Additional Medicare Important Message give by:     If discussed at [place] of Stay Meetings, dates discussed:    Additional Comments:  Dessa Phi, RN 04/23/2015, 3:55 PM

## 2015-04-24 ENCOUNTER — Encounter: Payer: Self-pay | Admitting: Pulmonary Disease

## 2015-04-24 LAB — BASIC METABOLIC PANEL
ANION GAP: 8 (ref 5–15)
BUN: 21 mg/dL — AB (ref 6–20)
CHLORIDE: 95 mmol/L — AB (ref 101–111)
CO2: 34 mmol/L — ABNORMAL HIGH (ref 22–32)
Calcium: 9.1 mg/dL (ref 8.9–10.3)
Creatinine, Ser: 0.97 mg/dL (ref 0.61–1.24)
GFR calc non Af Amer: >60 mL/min (ref >60)
Glucose, Bld: 94 mg/dL (ref 65–99)
POTASSIUM: 4.3 mmol/L (ref 3.5–5.1)
SODIUM: 137 mmol/L (ref 135–145)

## 2015-04-24 LAB — CBC
HCT: 57.5 % — ABNORMAL HIGH (ref 39.0–52.0)
HEMOGLOBIN: 16.2 g/dL (ref 13.0–17.0)
MCH: 25.6 pg — AB (ref 26.0–34.0)
MCHC: 28.2 g/dL — ABNORMAL LOW (ref 30.0–36.0)
MCV: 90.7 fL (ref 78.0–100.0)
Platelets: 171 10*3/uL (ref 150–400)
RBC: 6.34 MIL/uL — AB (ref 4.22–5.81)
RDW: 16.8 % — ABNORMAL HIGH (ref 11.5–15.5)
WBC: 6.7 10*3/uL (ref 4.0–10.5)

## 2015-04-24 NOTE — Progress Notes (Signed)
PULMONARY / CRITICAL CARE MEDICINE   Name: Douglas [name]. MRN: [id number] DOB: Mar 16, 1985    ADMISSION DATE:  04/14/2015 CONSULTATION DATE:  3/21  REFERRING MD:  Charlies Silvers   CHIEF COMPLAINT:  Acute on chronic resp failure   HISTORY OF PRESENT ILLNESS:   This is a 30 year old male w/ chronic resp failure in setting of severe OSA/OHS. Has h/o BIPAP non-compliance. Followed at the heart failure clinic for severe secondary PAH, Cor Pulmonale & diastolic dysfxn. Marland Kitchen His father who is at bedside reports BIPAP not been used in about a month & that he had been more somnolent than usual. Also noted some confusion on 3/20. He awoke nauseated and dizzy at 0400 on 3/21. Called EMS who reported he was not using his BIPAP. At 0400 on 3/21 he awoke feeling dizzy and nauseated. Vomited 3-4 times. Called EMS. On arrival was more lethargic. Placed on NIPPV. In spite of attempts w/ NIPPV he became progressively lethargic and hypercarbic (as well as hypoxic). His PO2 dropped in 60s and PCO2 was to high to evaluate. Because of this he was intubated & PCCM asked to assume care.    SUBJECTIVE:  Wore BiPAP overnight Discussed trach w Dr Janace Hoard and agrees that this is best plan.   VITAL SIGNS: BP 165/112 mmHg  Pulse 75  Temp(Src) 98.1 F (36.7 C) (Axillary)  Resp 18  Ht 5\' 7"  (1.702 m)  Wt 123.2 kg (271 lb 9.7 oz)  BMI 42.53 kg/m2  SpO2 100%  HEMODYNAMICS:    VENTILATOR SETTINGS:    INTAKE / OUTPUT: I/O last 3 completed shifts: In: 360 [P.O.:360] Out: -   PHYSICAL EXAMINATION: General:  Obese aam, no distress in bed Neuro:  Awake, appropriate  HEENT:  Neck is large and short. No JVD Cardiovascular:  Rrr, no MRG  Lungs: resps even non labored on RA, reduced but no wheezing, no use of accessory muscles  Abdomen:  Obese, no OM, + bowel sounds Musculoskeletal:  Equal st and bulk  Skin:  Warm and dry, LE chronic venous changes.   LABS:  BMET  Recent Labs Lab 04/19/15 0643 04/20/15 0315  04/24/15 0311  NA 137 136 137  K 4.0 3.9 4.3  CL 90* 87* 95*  CO2 40* 42* 34*  BUN 14 14 21*  CREATININE 0.76 0.72 0.97  GLUCOSE 93 86 94    Electrolytes  Recent Labs Lab 04/19/15 0643 04/20/15 0315 04/24/15 0311  CALCIUM 8.9 8.7* 9.1  MG 1.6* 1.9  --   PHOS 3.7 2.9  --     CBC  Recent Labs Lab 04/19/15 0643 04/24/15 0311  WBC 7.4 6.7  HGB 17.0 16.2  HCT 60.3* 57.5*  PLT 147* 171    Coag's No results for input(s): APTT, INR in the last 168 hours.  Sepsis Markers No results for input(s): LATICACIDVEN, PROCALCITON, O2SATVEN in the last 168 hours.  ABG No results for input(s): PHART, PCO2ART, PO2ART in the last 168 hours.  Liver Enzymes No results for input(s): AST, ALT, ALKPHOS, BILITOT, ALBUMIN in the last 168 hours.  Cardiac Enzymes No results for input(s): TROPONINI, PROBNP in the last 168 hours.  Glucose No results for input(s): GLUCAP in the last 168 hours.  Imaging No results found.  STUDIES:   CULTURES: Influenza PCR 3/21>>>neg Sputum culture 3/21>>> NPF BC x 2 3/21>>>neg  ANTIBIOTICS: unasyn 3/21>>>3/24 azithro 3/21>>>3/23  SIGNIFICANT EVENTS: 3/25- back to NIMV 3/26- refusing NIMV, was neg 3.5 liters 3/27 - wore NIMV for  4 hours, refused to wear any longer  3/30 - wore NIMV for 7 hrs  LINES/TUBES: OETT 3/21 (difficulty airway)>>>3/23 3/21 rt i j cvl>>3/27  ASSESSMENT / PLAN:  Acute on Chronic Hypercarbic and Hypoxic respiratory failure in setting of decompensated OHS/OSA w/ secondary PAH resulting in decompensated Cor Pulmonale and diastolic dysfxn.  DIFFICULT AIRWAY  BIPAP noncompliance  ->extubated 3/23 Plan:   Continue lasix at home dose - 80mg  BID  He has been more compliant with bipap the last 3 nights, however still highly doubt that he will be reliably compliant at home and suspect that trach is the safest option for him - discussed at length and he is agreeable to trach at this point. Appreciate Dr Janace Hoard assistance.  Will need to transfer pt to [facility] once we have a surgery date scheduled. I do not believe he should go home in the interim to depend on BiPAP given his high risk for hypoxemia.  Supplemental O2 as needed  Pulmonary hygiene  Continue qhs bipap until trach   Secondary PAH Cor pulmonale Acute on Chronic Diastolic Heart Failure - present on admission  Plan:  Cont home lasix, lisinopril, coreg  Follow BMP  Obesity  Nausea/vomiting Elevated ammonia ? Hepatic congestion  Plan:   Diet  Polycythemia in setting of chronic hypoxia  Plan:  [place] heparin   Acute hypercarbic Encephalopathy-->resolved  Plan:   Lactulose for hyperammonemia and BM   Keep SD status for now and anticipate trach placement soon.     Baltazar Apo, MD, PhD 04/24/2015, 10:00 AM [place] [phone] or if no answer [phone]

## 2015-04-24 NOTE — Progress Notes (Addendum)
PT Cancellation Note  Patient Details Name: Douglas [name]. MRN: [id number] DOB: 07/19/1985   Cancelled Treatment:    Reason Eval/Treat Not Completed: Other (comment)per RN , has ambulated x 2 laps without assistance and rooled his oxygen with RN . Will check back tomorrow.   Claretha Cooper 04/24/2015, 2:01 PM [facility] PT [phone]

## 2015-04-24 NOTE — Progress Notes (Signed)
04/24/15  1820  Patient his currently on a heart healthy/carb mod diet.  Patient has been eating food that family has been bringing in. (Ex: 3/30 hotdodgs and chicken wings, 04/24/15 chicken wings and pizza)

## 2015-04-25 NOTE — Progress Notes (Signed)
PULMONARY / CRITICAL CARE MEDICINE   Name: Douglas [name]. MRN: [id number] DOB: December 24, 1985    ADMISSION DATE:  04/14/2015 CONSULTATION DATE:  3/21  REFERRING MD:  Charlies Silvers   CHIEF COMPLAINT:  Acute on chronic resp failure   HISTORY OF PRESENT ILLNESS:   This is a 30 year old male w/ chronic resp failure in setting of severe OSA/OHS. Has h/o BIPAP non-compliance. Followed at the heart failure clinic for severe secondary PAH, Cor Pulmonale & diastolic dysfxn. Marland Kitchen His father who is at bedside reports BIPAP not been used in about a month & that he had been more somnolent than usual. Also noted some confusion on 3/20. He awoke nauseated and dizzy at 0400 on 3/21. Called EMS who reported he was not using his BIPAP. At 0400 on 3/21 he awoke feeling dizzy and nauseated. Vomited 3-4 times. Called EMS. On arrival was more lethargic. Placed on NIPPV. In spite of attempts w/ NIPPV he became progressively lethargic and hypercarbic (as well as hypoxic). His PO2 dropped in 60s and PCO2 was to high to evaluate. Because of this he was intubated & PCCM asked to assume care.    SUBJECTIVE:  Wore BiPAP overnight ENT planning for trach next week.  VITAL SIGNS: BP 162/114 mmHg  Pulse 79  Temp(Src) 97.1 F (36.2 C) (Oral)  Resp 16  Ht 5\' 7"  (1.702 m)  Wt 258 lb 6.1 oz (117.2 kg)  BMI 40.46 kg/m2  SpO2 94%  HEMODYNAMICS:    VENTILATOR SETTINGS:    INTAKE / OUTPUT: I/O last 3 completed shifts: In: 300 [P.O.:300] Out: -   PHYSICAL EXAMINATION: General:  Obese AAM, sitting by bedside Neuro:  Awake, No focal deficits HEENT:  No JVD Cardiovascular:  RRR, no MRG  Lungs: Clear, no wheeze, crackles Abdomen:  Soft, + bowel sounds Musculoskeletal:  Normal bulk and tone Skin:  No rash  LABS:  BMET  Recent Labs Lab 04/19/15 0643 04/20/15 0315 04/24/15 0311  NA 137 136 137  K 4.0 3.9 4.3  CL 90* 87* 95*  CO2 40* 42* 34*  BUN 14 14 21*  CREATININE 0.76 0.72 0.97  GLUCOSE 93 86 94     Electrolytes  Recent Labs Lab 04/19/15 0643 04/20/15 0315 04/24/15 0311  CALCIUM 8.9 8.7* 9.1  MG 1.6* 1.9  --   PHOS 3.7 2.9  --     CBC  Recent Labs Lab 04/19/15 0643 04/24/15 0311  WBC 7.4 6.7  HGB 17.0 16.2  HCT 60.3* 57.5*  PLT 147* 171    Coag's No results for input(s): APTT, INR in the last 168 hours.  Sepsis Markers No results for input(s): LATICACIDVEN, PROCALCITON, O2SATVEN in the last 168 hours.  ABG No results for input(s): PHART, PCO2ART, PO2ART in the last 168 hours.  Liver Enzymes No results for input(s): AST, ALT, ALKPHOS, BILITOT, ALBUMIN in the last 168 hours.  Cardiac Enzymes No results for input(s): TROPONINI, PROBNP in the last 168 hours.  Glucose No results for input(s): GLUCAP in the last 168 hours.  Imaging No results found.  STUDIES:   CULTURES: Influenza PCR 3/21>>>neg Sputum culture 3/21>>> NPF BC x 2 3/21>>>neg  ANTIBIOTICS: unasyn 3/21>>>3/24 azithro 3/21>>>3/23  SIGNIFICANT EVENTS: 3/25- back to NIMV 3/26- refusing NIMV, was neg 3.5 liters 3/27 - wore NIMV for 4 hours, refused to wear any longer  3/30 - wore NIMV for 7 hrs  LINES/TUBES: OETT 3/21 (difficulty airway)>>>3/23 3/21 rt i j cvl>>3/27  ASSESSMENT / PLAN:  Acute on  Chronic Hypercarbic and Hypoxic respiratory failure in setting of decompensated OHS/OSA w/ secondary PAH resulting in decompensated Cor Pulmonale and diastolic dysfxn.  DIFFICULT AIRWAY  BIPAP noncompliance  ->extubated 3/23 Plan:   Continue lasix at home dose - 80mg  BID  He has been more compliant with bipap the last 4 nights. Today he told me that he is reconsidering the trach. He feels that he will be more compliant with Bipap and wants to bring his home machine and try it on for a few nights.  I doubt that this will work as he has had multiple admits after non compliance. I told him that our strong recommendation was to get the trach. We will continue to reassess his decision  regarding the trach. If he decides to go home on the bipap, that is with the understanding that he is doing this as risk of repeat resp failure.   Pulmonary hygiene  Continue qhs bipap.  Secondary PAH Cor pulmonale Acute on Chronic Diastolic Heart Failure - present on admission  Plan:  Cont home lasix, lisinopril, coreg  Follow BMP  Obesity  Nausea/vomiting Elevated ammonia ? Hepatic congestion  Plan:   Diet  Polycythemia in setting of chronic hypoxia  Plan:  [place] heparin   Acute hypercarbic Encephalopathy-->resolved  Plan:   Lactulose for hyperammonemia and BM Keep in SDU status for now.  Marshell Garfinkel MD [facility] Pager [phone] If no answer or after 3pm call: [phone] 04/25/2015, 9:55 AM

## 2015-04-25 NOTE — Evaluation (Signed)
Physical Therapy Evaluation Patient Details Name: Douglas [name]. MRN: [id number] DOB: Dec 15, 1985 Today's Date: 04/25/2015   History of Present Illness  Douglas [name]  is a 30 year old male w/ chronic resp failure in setting of severe OSA/OHS. Has h/o BIPAP non-compliance., Cor Pulmonale & diastolic dysfxn. . Family reports  that he had been more somnolent than usual and  noted some confusion on 3/20. On 3/21 Called EMS due to  feeling dizzy and nauseated.  On arrival  he became progressively lethargic and hypercarbic (as well as hypoxic). Because of this he was intubated . Plans for elective tracheostomy  soon.  Clinical Impression  The patient is mobile with ambulation. Plans for trach soon. Oxygen saturation on RA >88%. No c/o dyspnea. Pt admitted with above diagnosis. Pt currently with functional limitations due to the deficits listed below (see PT Problem List).  Pt will benefit from skilled PT to increase their independence and safety with mobility to allow discharge to home.       Follow Up Recommendations No PT follow up    Equipment Recommendations  None recommended by PT    Recommendations for Other Services       Precautions / Restrictions Precautions Precaution Comments: monitor sats      Mobility  Bed Mobility               General bed mobility comments: inrecliner  Transfers Overall transfer level: Independent                  Ambulation/Gait Ambulation/Gait assistance: Supervision Ambulation Distance (Feet): 840 Feet Assistive device: None Gait Pattern/deviations: WFL(Within Functional Limits)   Gait velocity interpretation: at or above normal speed for age/gender General Gait Details: reported brief dizziness when turned around quickly.  Stairs            Wheelchair Mobility    Modified Rankin (Stroke Patients Only)       Balance                                             Pertinent Vitals/Pain Pain  Assessment: No/denies pain    Home Living Family/patient expects to be discharged to:: Private residence   Available Help at Discharge: Family;Available 24 hours/day Type of Home: Apartment Home Access: Level entry     Home Layout: One level   Additional Comments: home Oxygen    Prior Function Level of Independence: Independent               Hand Dominance        Extremity/Trunk Assessment   Upper Extremity Assessment: Overall WFL for tasks assessed           Lower Extremity Assessment: Overall WFL for tasks assessed         Communication   Communication: No difficulties  Cognition Arousal/Alertness: Awake/alert Behavior During Therapy: WFL for tasks assessed/performed Overall Cognitive Status: Within Functional Limits for tasks assessed                      General Comments      Exercises        Assessment/Plan    PT Assessment Patient needs continued PT services  PT Diagnosis Generalized weakness   PT Problem List Cardiopulmonary status limiting activity  PT Treatment Interventions Gait training   PT Goals (Current goals can be  found in the Care Plan section) Acute Rehab PT Goals Patient Stated Goal: to go outside PT Goal Formulation: With patient Time For Goal Achievement: 05/09/15 Potential to Achieve Goals: Good    Frequency Min 2X/week   Barriers to discharge        Co-evaluation               End of Session   Activity Tolerance: Patient tolerated treatment well Patient left: in chair;with call bell/phone within reach Nurse Communication: Mobility status         Time: [id number] PT Time Calculation (min) (ACUTE ONLY): 15 min   Charges:   PT Evaluation $PT Eval Low Complexity: 1 Procedure     PT G CodesClaretha Cooper 04/25/2015, 8:51 AM Tresa Endo PT [phone]

## 2015-04-25 NOTE — Progress Notes (Signed)
RT placed patient on BIPAP. Patient setting is 18/10 with 6 liters oxygen bled in. Sterile water added to water chamber for humidification. Patient is tolerating well. RT will continue to monitor.

## 2015-04-26 ENCOUNTER — Encounter (HOSPITAL_COMMUNITY): Payer: Self-pay | Admitting: Anesthesiology

## 2015-04-26 LAB — BASIC METABOLIC PANEL
Anion gap: 9 (ref 5–15)
BUN: 18 mg/dL (ref 6–20)
CALCIUM: 9.2 mg/dL (ref 8.9–10.3)
CO2: 31 mmol/L (ref 22–32)
CREATININE: 0.94 mg/dL (ref 0.61–1.24)
Chloride: 97 mmol/L — ABNORMAL LOW (ref 101–111)
GLUCOSE: 84 mg/dL (ref 65–99)
Potassium: 4 mmol/L (ref 3.5–5.1)
Sodium: 137 mmol/L (ref 135–145)

## 2015-04-26 LAB — CBC
HCT: 55.6 % — ABNORMAL HIGH (ref 39.0–52.0)
Hemoglobin: 16.5 g/dL (ref 13.0–17.0)
MCH: 25.5 pg — AB (ref 26.0–34.0)
MCHC: 29.7 g/dL — AB (ref 30.0–36.0)
MCV: 86.1 fL (ref 78.0–100.0)
PLATELETS: 161 10*3/uL (ref 150–400)
RBC: 6.46 MIL/uL — AB (ref 4.22–5.81)
RDW: 16.6 % — ABNORMAL HIGH (ref 11.5–15.5)
WBC: 5.6 10*3/uL (ref 4.0–10.5)

## 2015-04-26 NOTE — Progress Notes (Signed)
PULMONARY / CRITICAL CARE MEDICINE   Name: Douglas [name]. MRN: [id number] DOB: July 19, 1985    ADMISSION DATE:  04/14/2015 CONSULTATION DATE:  3/21  REFERRING MD:  Charlies Silvers   CHIEF COMPLAINT:  Acute on chronic resp failure   HISTORY OF PRESENT ILLNESS:   This is a 30 year old male w/ chronic resp failure in setting of severe OSA/OHS. Has h/o BIPAP non-compliance. Followed at the heart failure clinic for severe secondary PAH, Cor Pulmonale & diastolic dysfxn. Marland Kitchen His father who is at bedside reports BIPAP not been used in about a month & that he had been more somnolent than usual. Also noted some confusion on 3/20. He awoke nauseated and dizzy at 0400 on 3/21. Called EMS who reported he was not using his BIPAP. At 0400 on 3/21 he awoke feeling dizzy and nauseated. Vomited 3-4 times. Called EMS. On arrival was more lethargic. Placed on NIPPV. In spite of attempts w/ NIPPV he became progressively lethargic and hypercarbic (as well as hypoxic). His PO2 dropped in 60s and PCO2 was to high to evaluate. Because of this he was intubated & PCCM asked to assume care.   SUBJECTIVE:  Wore BiPAP overnight  VITAL SIGNS: BP 138/92 mmHg  Pulse 67  Temp(Src) 97.6 F (36.4 C) (Axillary)  Resp 15  Ht 5\' 7"  (1.702 m)  Wt 258 lb 6.1 oz (117.2 kg)  BMI 40.46 kg/m2  SpO2 98%  HEMODYNAMICS:    VENTILATOR SETTINGS:    INTAKE / OUTPUT: I/O last 3 completed shifts: In: 300 [P.O.:300] Out: -   PHYSICAL EXAMINATION: General:  Obese AAM, sitting by bedside Neuro:  Awake, No focal deficits HEENT:  No JVD Cardiovascular:  RRR, no MRG  Lungs: Clear, no wheeze, crackles Abdomen:  Soft, + bowel sounds Musculoskeletal:  Normal bulk and tone Skin:  No rash  LABS:  BMET  Recent Labs Lab 04/20/15 0315 04/24/15 0311 04/26/15 0313  NA 136 137 137  K 3.9 4.3 4.0  CL 87* 95* 97*  CO2 42* 34* 31  BUN 14 21* 18  CREATININE 0.72 0.97 0.94  GLUCOSE 86 94 84    Electrolytes  Recent Labs Lab  04/20/15 0315 04/24/15 0311 04/26/15 0313  CALCIUM 8.7* 9.1 9.2  MG 1.9  --   --   PHOS 2.9  --   --     CBC  Recent Labs Lab 04/24/15 0311 04/26/15 0313  WBC 6.7 5.6  HGB 16.2 16.5  HCT 57.5* 55.6*  PLT 171 161    Coag's No results for input(s): APTT, INR in the last 168 hours.  Sepsis Markers No results for input(s): LATICACIDVEN, PROCALCITON, O2SATVEN in the last 168 hours.  ABG No results for input(s): PHART, PCO2ART, PO2ART in the last 168 hours.  Liver Enzymes No results for input(s): AST, ALT, ALKPHOS, BILITOT, ALBUMIN in the last 168 hours.  Cardiac Enzymes No results for input(s): TROPONINI, PROBNP in the last 168 hours.  Glucose No results for input(s): GLUCAP in the last 168 hours.  Imaging No results found.  STUDIES:   CULTURES: Influenza PCR 3/21>>>neg Sputum culture 3/21>>> NPF BC x 2 3/21>>>neg  ANTIBIOTICS: unasyn 3/21>>>3/24 azithro 3/21>>>3/23  SIGNIFICANT EVENTS: 3/25- back to NIMV 3/26- refusing NIMV, was neg 3.5 liters 3/27 - wore NIMV for 4 hours, refused to wear any longer  3/30 - wore NIMV for 7 hrs  LINES/TUBES: OETT 3/21 (difficulty airway)>>>3/23 3/21 rt i j cvl>>3/27  ASSESSMENT / PLAN:  Acute on Chronic Hypercarbic and  Hypoxic respiratory failure in setting of decompensated OHS/OSA w/ secondary PAH resulting in decompensated Cor Pulmonale and diastolic dysfxn.  DIFFICULT AIRWAY  BIPAP noncompliance  ->extubated 3/23 Plan:   Continue lasix at home dose - 80mg  BID  He has been more compliant with bipap the last 5 nights. He told me that he is reconsidering the trach. He feels that he will be more compliant with Bipap and wants to bring his home machine and try it on for a few nights.  I doubt that this will work as he has had multiple admits after non compliance. I told him that our strong recommendation was to get the trach. We will continue to reassess his decision regarding the trach. If he decides to go home on  the bipap, that is with the understanding that he is doing it with a risk of repeat resp failure.   Pulmonary hygiene  Continue qhs bipap. Start using the home Bipap.   Secondary PAH Cor pulmonale Acute on Chronic Diastolic Heart Failure - present on admission  Plan:  Cont home lasix, lisinopril, coreg  Follow BMP  Obesity  Nausea/vomiting Elevated ammonia ? Hepatic congestion  Plan:   PO diet  Polycythemia in setting of chronic hypoxia  Plan:  [place] heparin   Acute hypercarbic Encephalopathy-->resolved  Plan:   Monitor  Transfer to floor. Will remain on PCCM service for now until we can resolve the trach issue.   Marshell Garfinkel MD [facility] Pager [phone] If no answer or after 3pm call: [phone] 04/26/2015, 9:36 AM

## 2015-04-26 NOTE — Progress Notes (Signed)
3W called due to patient request to wear hospital BiPAP QHS instead of home unit. Patient is suppose to receive trach unless he is compliant with and can tolerate home BiPAP machine. CCM is following this patient and patient is aware of this information. If home machine is found to not be sufficient, patient is able to use hospital unit after confirmation that it is not working. RT checked unit and it would power on and create a pressure prior to evening call.  Machine will be checked and placed on patient at bedtime. If not working at this time, another will be provide to ensure compliance with BiPAP.

## 2015-04-27 DIAGNOSIS — J9621 Acute and chronic respiratory failure with hypoxia: Secondary | ICD-10-CM

## 2015-04-27 DIAGNOSIS — J9622 Acute and chronic respiratory failure with hypercapnia: Secondary | ICD-10-CM

## 2015-04-27 NOTE — Progress Notes (Addendum)
RT NOTE:  RT assisted patient w/ home BIPAP. Pt request to wear tonight. BIPAP @ bedside. No o2 needed. Pt comfortable and sleep upon RT exit.   0330: Pt continues to wear BIPAP tonight.

## 2015-04-27 NOTE — Progress Notes (Signed)
See RT note, pt complaint with wearing bipap from time he went to sleep until shortly after 0500, when he woke up and decided to sit in the chair. Hortencia Conradi RN

## 2015-04-27 NOTE — Progress Notes (Signed)
Nutrition Brief Follow-up  RD to follow-up for patient's LOS of 13 days. Pt with no nutritional needs identified at this time.  Wt Readings from Last 15 Encounters:  04/27/15 255 lb (115.667 kg)  01/08/15 267 lb 12.8 oz (121.473 kg)  12/30/14 272 lb 6.4 oz (123.56 kg)  11/26/14 256 lb 3.2 oz (116.212 kg)  10/30/14 252 lb (114.306 kg)  09/25/14 253 lb 8 oz (114.987 kg)  09/21/14 257 lb 4.8 oz (116.711 kg)  07/29/14 290 lb (131.543 kg)  06/30/14 296 lb 12.8 oz (134.628 kg)  06/12/14 283 lb 15.2 oz (128.8 kg)  06/09/14 287 lb 1.9 oz (130.237 kg)  04/07/14 270 lb (122.471 kg)  04/02/14 267 lb (121.11 kg)  03/25/14 267 lb (121.11 kg)  03/19/14 333 lb 12.8 oz (151.411 kg)    Body mass index is 39.93 kg/(m^2). Patient meets criteria for obesity based on current BMI.   Current diet order is Heart Healthy/CHO modified, patient is consuming approximately 100% of meals at this time. Per RN notes, pt has been consuming pizza, chicken wings and hot dogs provided by family.  Labs and medications reviewed.   No nutrition interventions warranted at this time. If nutrition issues arise, please consult RD.   Douglas Bibles, MS, RD, LDN Pager: [phone] After Hours Pager: [phone]

## 2015-04-27 NOTE — Progress Notes (Signed)
PULMONARY / CRITICAL CARE MEDICINE   Name: Douglas [name]. MRN: [id number] DOB: 1985-11-19    ADMISSION DATE:  04/14/2015 CONSULTATION DATE:  3/21  REFERRING MD:  Charlies Silvers   CHIEF COMPLAINT:  Acute on chronic resp failure   HISTORY OF PRESENT ILLNESS:   This is a 30 year old male w/ chronic resp failure in setting of severe OSA/OHS. Has h/o BIPAP non-compliance. Followed at the heart failure clinic for severe secondary PAH, Cor Pulmonale & diastolic dysfxn. Marland Kitchen His father who is at bedside reports BIPAP not been used in about a month & that he had been more somnolent than usual. Also noted some confusion on 3/20. He awoke nauseated and dizzy at 0400 on 3/21. Called EMS who reported he was not using his BIPAP. At 0400 on 3/21 he awoke feeling dizzy and nauseated. Vomited 3-4 times. Called EMS. On arrival was more lethargic. Placed on NIPPV. In spite of attempts w/ NIPPV he became progressively lethargic and hypercarbic (as well as hypoxic). His PO2 dropped in 60s and PCO2 was to high to evaluate. Because of this he was intubated & PCCM asked to assume care.   SUBJECTIVE:  Wore home bipap all night.   VITAL SIGNS: BP 123/82 mmHg  Pulse 78  Temp(Src) 97.9 F (36.6 C) (Oral)  Resp 18  Ht 5\' 7"  (1.702 m)  Wt 115.667 kg (255 lb)  BMI 39.93 kg/m2  SpO2 99%  HEMODYNAMICS:    VENTILATOR SETTINGS:    INTAKE / OUTPUT: I/O last 3 completed shifts: In: 240 [P.O.:240] Out: -   PHYSICAL EXAMINATION: General:  Pleasant, Obese AAM, sitting in chair  Neuro:  Awake, No focal deficits HEENT:  No JVD Cardiovascular:  RRR, no MRG  Lungs: resps even non labored on RA, Clear, no wheeze, crackles Abdomen:  Soft, + bowel sounds Musculoskeletal:  Normal bulk and tone, no sig edema  Skin:  No rash  LABS:  BMET  Recent Labs Lab 04/24/15 0311 04/26/15 0313  NA 137 137  K 4.3 4.0  CL 95* 97*  CO2 34* 31  BUN 21* 18  CREATININE 0.97 0.94  GLUCOSE 94 84    Electrolytes  Recent  Labs Lab 04/24/15 0311 04/26/15 0313  CALCIUM 9.1 9.2    CBC  Recent Labs Lab 04/24/15 0311 04/26/15 0313  WBC 6.7 5.6  HGB 16.2 16.5  HCT 57.5* 55.6*  PLT 171 161    Coag's No results for input(s): APTT, INR in the last 168 hours.  Sepsis Markers No results for input(s): LATICACIDVEN, PROCALCITON, O2SATVEN in the last 168 hours.  ABG No results for input(s): PHART, PCO2ART, PO2ART in the last 168 hours.  Liver Enzymes No results for input(s): AST, ALT, ALKPHOS, BILITOT, ALBUMIN in the last 168 hours.  Cardiac Enzymes No results for input(s): TROPONINI, PROBNP in the last 168 hours.  Glucose No results for input(s): GLUCAP in the last 168 hours.  Imaging No results found.  STUDIES:   CULTURES: Influenza PCR 3/21>>>neg Sputum culture 3/21>>> NPF BC x 2 3/21>>>neg  ANTIBIOTICS: unasyn 3/21>>>3/24 azithro 3/21>>>3/23  SIGNIFICANT EVENTS: 3/25- back to NIMV 3/26- refusing NIMV, was neg 3.5 liters 3/27 - wore NIMV for 4 hours, refused to wear any longer  3/30 - wore NIMV for 7 hrs  LINES/TUBES: OETT 3/21 (difficulty airway)>>>3/23 3/21 rt i j cvl>>3/27  ASSESSMENT / PLAN:  Acute on Chronic Hypercarbic and Hypoxic respiratory failure in setting of decompensated OHS/OSA w/ secondary PAH resulting in decompensated Cor Pulmonale and  diastolic dysfxn.  DIFFICULT AIRWAY  BIPAP noncompliance  ->extubated 3/23 Plan:   Continue lasix at home dose - 80mg  BID  Pulmonary hygiene  qhs bipap using home machine -- He has been more compliant with bipap the last 4-5 nights. He worse home bipap machine last night from midnight to 5am.  Previously had agreed to trach given multiple admissions for respiratory failure r/t decompensated OSA in setting noncompliance.  Now reconsidering trach and feels he will be more compliant with bipap.  Will ensure compliance with home bipap one more night with ABG in am to ensure adequate support and d/c in am if ABG ok.  He has been  counseled extensively on the importance of strict compliance, our recommendation for trach and the risk of repeat respiratory failure.    Secondary PAH Cor pulmonale Acute on Chronic Diastolic Heart Failure - present on admission  Plan:  Cont home lasix, lisinopril, coreg  Follow BMP  Obesity  Nausea/vomiting Elevated ammonia ? Hepatic congestion  Plan:   PO diet  Polycythemia in setting of chronic hypoxia  Plan:  [place] heparin   Acute hypercarbic Encephalopathy-->resolved  Plan:   Monitor    Nickolas Madrid, NP 04/27/2015  10:19 AM Pager: [phone] or [phone]  PCCM Attending Note: Patient seen and examined with nurse practitioner. Please refer to her progress note which I reviewed in detail. Continuing patient slept with BiPAP last night but did awaken at least once. He reports he did consistently use his BiPAP without difficulty. Feels he did obtain a good quality of sleep. At this time patient wishes to decline placement of tracheostomy in favor of compliance with home BiPAP.  BP 123/82 mmHg  Pulse 78  Temp(Src) 97.9 F (36.6 C) (Oral)  Resp 18  Ht 5\' 7"  (1.702 m)  Wt 115.667 kg (255 lb)  BMI 39.93 kg/m2  SpO2 99% Gen.: Sitting up in bed. Watching TV. No distress. Morbidly obese. Integument: Warm and dry. No rash on exposed skin. Pulmonary: Symmetrically decreased breath sounds. Normal work of breathing. Cardiovascular: Regular rate. Unable to appreciate JVD. No edema.  CBC Latest Ref Rng 04/26/2015 04/24/2015 04/19/2015  WBC 4.0 - 10.5 K/uL 5.6 6.7 7.4  Hemoglobin 13.0 - 17.0 g/dL 16.5 16.2 17.0  Hematocrit 39.0 - 52.0 % 55.6(H) 57.5(H) 60.3(H)  Platelets 150 - 400 K/uL 161 171 147(L)    BMP Latest Ref Rng 04/26/2015 04/24/2015 04/20/2015  Glucose 65 - 99 mg/dL 84 94 86  BUN 6 - 20 mg/dL 18 21(H) 14  Creatinine 0.61 - 1.24 mg/dL 0.94 0.97 0.72  Sodium 135 - 145 mmol/L 137 137 136  Potassium 3.5 - 5.1 mmol/L 4.0 4.3 3.9  Chloride 101 - 111 mmol/L 97(L)  95(L) 87(L)  CO2 22 - 32 mmol/L 31 34(H) 42(H)  Calcium 8.9 - 10.3 mg/dL 9.2 9.1 8.7(L)    A/P: 30 year old African-American male with morbid obesity and secondary pulmonary arterial hypertension with acute on chronic hypercarbic and hypoxic respiratory failure secondary to OSA/OHS. Patient largely appears euvolemic. Continuing home dose Lasix at this time. He did tolerate BiPAP overnight. At this time he wishes to defer placement of tracheostomy. Patient understands that  continuing if he is noncompliant with his home BiPAPto help him survive he will need placement of a tracheostomy.   1. Acute on chronic hypercarbic respiratory failure: Resolving. Continuing home BiPAP. Checking ABG on room air in the morning. 2. Acute on chronic hypoxic respiratory failure: Resolved. 3. Acute encephalopathy: Secondary to hypercarbia. Resolved. 4.  Acute on chronic diastolic congestive heart failure with cor pulmonale from pulmonary arterial hypertension: Continuing home aspirin, Lasix, lisinopril, & Coreg.  5. Borderline hypokalemia: Replacing orally.  6. Disposition: Likely will discharge patient to home tomorrow if he continues to utilize BiPAP at night and ABG is acceptable and compensated in the morning.   Sonia Baller Ashok Cordia, M.D. [facility] Pager:  [phone] After 3pm or if no response, call [phone] 11:41 AM 04/27/2015

## 2015-04-28 ENCOUNTER — Encounter (HOSPITAL_COMMUNITY)
Admission: EM | Disposition: A | Discharge status: Home Health | Payer: Self-pay | Source: Home / Self Care | Attending: Internal Medicine

## 2015-04-28 LAB — BLOOD GAS, ARTERIAL
Acid-Base Excess: 5.1 mmol/L — ABNORMAL HIGH (ref 0.0–2.0)
BICARBONATE: 31.8 meq/L — AB (ref 20.0–24.0)
Drawn by: 11249
FIO2: 0.21
O2 SAT: 90 %
PH ART: 7.376 (ref 7.350–7.450)
PO2 ART: 65.6 mmHg — AB (ref 80.0–100.0)
Patient temperature: 98.3
TCO2: 27.4 mmol/L (ref 0–100)
pCO2 arterial: 55.5 mmHg — ABNORMAL HIGH (ref 35.0–45.0)

## 2015-04-28 SURGERY — CREATION, TRACHEOSTOMY
Anesthesia: General

## 2015-04-28 NOTE — Care Management Note (Signed)
Case Management Note  Patient Details  Name: Douglas [name]. MRN: [id number] Date of Birth: 1985-09-28  Subjective/Objective:      30 yo admitted with Acute Respiratory Failure              Action/Plan: From home and had Bipap at home prior to admission through [place]  Expected Discharge Date:   (unknown)               Expected Discharge Plan:  Home/Self Care  In-House Referral:     Discharge planning Services  CM Consult  Post Acute Care Choice:    Choice offered to:     DME Arranged:    DME Agency:     HH Arranged:    [place] Agency:     Status of Service:  Completed, signed off  Medicare Important Message Given:    Date Medicare IM Given:    Medicare IM give by:    Date Additional Medicare IM Given:    Additional Medicare Important Message give by:     If discussed at [place] of Stay Meetings, dates discussed:    Additional Comments: CM order for Respiratory Therapist at home.  This CM called [employer] that supplies his Bipap to inquire about pt ability to receive respiratory therapist at home.  This Cm was informed that Huey Romans would have a Respiratory Therapist do a phone interview with pt to inquire about his respiratory needs.  Pt made aware and given number to call into [employer] to do Respiratory interview.  Pt states he will do this.  No other CM needs communicated. Lynnell Catalan, RN 04/28/2015, 12:56 PM

## 2015-04-29 ENCOUNTER — Telehealth: Payer: Self-pay | Admitting: Adult Health

## 2015-04-29 NOTE — Telephone Encounter (Signed)
Patient contacted regarding discharge from [facility] on 4/4. ]  Patient understands to follow up with me Nickolas Madrid NP) in office on Wednesday 4/12 at 10:30am at the [facility].  Patient understands discharge instructions that were given? Yes  Patient understands medications and regimen? Yes - discussed timing of new medications -- he doesn't like taking them all at one time.  Patient understands to bring all medications and SD card from bipap to this visit? Yes - he would like a reminder call.    Patient denies further questions or concerns.   Nickolas Madrid, NP 04/29/2015  4:09 PM

## 2015-05-04 ENCOUNTER — Telehealth: Payer: Self-pay | Admitting: *Deleted

## 2015-05-04 NOTE — Telephone Encounter (Signed)
-----   Message from Marijean Heath, NP sent at 04/29/2015  4:10 PM EDT ----- Regarding: reminder  Please call pt and remind him to bring SD card from bipap to his appointment 4/12 for download.

## 2015-05-04 NOTE — Telephone Encounter (Signed)
Attempted to call pt. Line was busy. Will try back. 

## 2015-05-05 ENCOUNTER — Encounter (HOSPITAL_COMMUNITY): Payer: Medicaid Other

## 2015-05-06 ENCOUNTER — Ambulatory Visit (INDEPENDENT_AMBULATORY_CARE_PROVIDER_SITE_OTHER): Payer: Medicaid Other | Admitting: Adult Health

## 2015-05-06 ENCOUNTER — Ambulatory Visit (HOSPITAL_COMMUNITY)
Admission: RE | Admit: 2015-05-06 | Discharge: 2015-05-06 | Disposition: A | Discharge status: Home / Self Care | Payer: Medicaid Other | Source: Ambulatory Visit | Attending: Internal Medicine | Admitting: Internal Medicine

## 2015-05-06 ENCOUNTER — Encounter: Payer: Self-pay | Admitting: Adult Health

## 2015-05-06 ENCOUNTER — Encounter (HOSPITAL_COMMUNITY): Payer: Self-pay

## 2015-05-06 VITALS — BP 138/90 | HR 81 | Temp 99.9°F | Ht 64.0 in | Wt 262.6 lb

## 2015-05-06 VITALS — BP 140/90 | HR 74 | Resp 18 | Wt 261.4 lb

## 2015-05-06 DIAGNOSIS — G4733 Obstructive sleep apnea (adult) (pediatric): Secondary | ICD-10-CM | POA: Diagnosis not present

## 2015-05-06 DIAGNOSIS — I2781 Cor pulmonale (chronic): Secondary | ICD-10-CM

## 2015-05-06 DIAGNOSIS — I110 Hypertensive heart disease with heart failure: Secondary | ICD-10-CM | POA: Diagnosis not present

## 2015-05-06 DIAGNOSIS — I10 Essential (primary) hypertension: Secondary | ICD-10-CM | POA: Diagnosis not present

## 2015-05-06 DIAGNOSIS — J9622 Acute and chronic respiratory failure with hypercapnia: Secondary | ICD-10-CM | POA: Diagnosis not present

## 2015-05-06 DIAGNOSIS — Z8249 Family history of ischemic heart disease and other diseases of the circulatory system: Secondary | ICD-10-CM | POA: Insufficient documentation

## 2015-05-06 DIAGNOSIS — E7800 Pure hypercholesterolemia, unspecified: Secondary | ICD-10-CM | POA: Diagnosis not present

## 2015-05-06 DIAGNOSIS — Z7982 Long term (current) use of aspirin: Secondary | ICD-10-CM | POA: Insufficient documentation

## 2015-05-06 DIAGNOSIS — E662 Morbid (severe) obesity with alveolar hypoventilation: Secondary | ICD-10-CM | POA: Insufficient documentation

## 2015-05-06 DIAGNOSIS — I5032 Chronic diastolic (congestive) heart failure: Secondary | ICD-10-CM

## 2015-05-06 DIAGNOSIS — Z79899 Other long term (current) drug therapy: Secondary | ICD-10-CM | POA: Diagnosis not present

## 2015-05-06 DIAGNOSIS — Z87891 Personal history of nicotine dependence: Secondary | ICD-10-CM | POA: Diagnosis not present

## 2015-05-06 NOTE — Progress Notes (Signed)
Chief Complaint  Patient presents with  . [facility] Follow up    Pt reports breathing has improved.  Slight nonprod cough.  No SOB, wheezing, chest tightness, CP, or f/c/s.     Tests   Past medical hx Past Medical History  Diagnosis Date  . Hypertension   . Obesity   . Tobacco abuse   . Substance abuse 8/31/213    cociane "first time use"  . High cholesterol   . CHF (congestive heart failure) ([place])   . Chronic bronchitis ([place])   . OSA on CPAP   . Arthritis     "hands, feet" (10/31/2013)  . Pneumonia 09/2011     Past surgical hx, Allergies, Family hx, Social hx all reviewed.  Current Outpatient Prescriptions on File Prior to Visit  Medication Sig  . albuterol (PROVENTIL HFA;VENTOLIN HFA) 108 (90 BASE) MCG/ACT inhaler Inhale 1-2 puffs into the lungs every 6 (six) hours as needed for wheezing or shortness of breath.  . carvedilol (COREG) 3.125 MG tablet Take 1 tablet (3.125 mg total) by mouth 2 (two) times daily with a meal.  . furosemide (LASIX) 40 MG tablet Take 2 tablets (80 mg total) by mouth 2 (two) times daily.  Marland Kitchen lisinopril (PRINIVIL,ZESTRIL) 5 MG tablet Take 1 tablet (5 mg total) by mouth 2 (two) times daily.  . potassium chloride SA (K-DUR,KLOR-CON) 20 MEQ tablet Take 1 tablet (20 mEq total) by mouth daily.  Marland Kitchen aspirin 325 MG EC tablet Take 1 tablet (325 mg total) by mouth daily. (Patient not taking: Reported on 05/06/2015)   No current facility-administered medications on file prior to visit.     Vital Signs BP 138/90 mmHg  Pulse 81  Temp(Src) 99.9 F (37.7 C) (Oral)  Ht 5\' 4"  (1.626 m)  Wt 262 lb 9.6 oz (119.115 kg)  BMI 45.05 kg/m2  SpO2 92%  History of Present Illness Douglas [name]. is a 30 y.o. male with hx severe OSA/OHS, secondary PAH, cor pulmonale and diastolic dysfunction.  He was recently admitted to [facility] with acute on chronic respiratory failure requiring intubation which was likely multifactorial r/t bipap noncompliance  with decompensated OSA/OHS as well as volume overload.  He was treated with IV diuresis and BP control.  He was extubated and transitioned to nocturnal bipap.  Lurline Idol was discussed at length.  Pt initially agreed but reconsidered as he got more comfortable with bipap. He had his home bipap brought in and we ensured several nights of bipap compliance with his home machine. F/u ABG after 2 nights of home bipap was stable (7.37 / 55.5 / 65.6 / 31.8). He was discharged on 4/4 with nocturnal bipap.    He returns today for hospital f/u.  Has been wearing bipap but thinks he is pulling it off in his sleep.  He can tell a big difference when he wears it - more energy.  He denies SOB, cough, sig daytime sleepiness, BLE edema, fevers, chills.  He has only needed his SABA 1-2x/week.  He is getting ready to start a new job and would like a note that this is ok.    He is motivated to be compliant and "never want to go back to the hospital".  He does feel like he is "doing the best he can" with bipap.  Forgot his SD card today.  He is still willing to discuss trach placement if he is unable to tolerate bipap long term.  He will bring his SD card next  week for download.    Physical Exam  General - pleasant, obese male, No distress  ENT - No sinus tenderness, no oral exudate, no LAN Cardiac - s1s2 regular, no murmur Chest - resps even non-labored on RA, No wheeze/rales/dullness Back - No focal tenderness Abd - Soft, non-tender Ext - No edema Neuro - Normal strength Skin - No rashes Psych - normal mood, and behavior   Assessment/Plan  Acute on chronic hypercarbic respiratory failure --r/t decompensated OSA/OHS (likely r/t noncompliance), HTN, volume overload -- resolved  Chronic hypercarbic respiratory failure   OSA/OHS -- hx noncompliance with bipap.  May need to continue trach discussions if no sig improvement in bipap compliance or if he has further decompensation.   HTN  Cor pulmonale   Patient  Instructions  Please bring by the SD card out of your bipap machine for download  Continue to wear bipap EVERY NIGHT  Try using it during the day while watching TV to help get used to it  Continue albuterol as needed  [facility] for you to start working - will print a note for work  Follow up with Dr. Halford Chessman in 3 months  [place]  Call for sooner follow up if needed      Douglas Madrid, NP 05/06/2015  12:34 PM

## 2015-05-06 NOTE — Progress Notes (Signed)
Advanced Heart Failure Medication Review by a Pharmacist  Does the patient  feel that his/her medications are working for him/her?  yes  Has the patient been experiencing any side effects to the medications prescribed?  no  Does the patient measure his/her own blood pressure or blood glucose at home?  no   Does the patient have any problems obtaining medications due to transportation or finances?   no  Understanding of regimen: good Understanding of indications: good Potential of compliance: excellent Patient understands to avoid NSAIDs. Patient understands to avoid decongestants.  Issues to address at subsequent visits: none   Pharmacist comments: Mr Tisdel presents to HF clinic for f/u after recent hospitalization. He denies any lightheadedness or dizziness due to medications. Does not miss doses and has excellent memory on dosages and frequency. He has been using his inhaler and reports using his rescue inhaler approximately 2 times/week.  No concerns or issues to address at this time for pt.  Heloise Ochoa, [place].D., BCPS PGY2 Cardiology Pharmacy Resident Pager: [phone]   Time with patient: 5 min Preparation and documentation time: 5 min Total time: 10 min

## 2015-05-06 NOTE — Patient Instructions (Signed)
Your physician recommends that you schedule a follow-up appointment in: 6 weeks In the [place] following things EVERYDAY: 1) Weigh yourself in the morning before breakfast. Write it down and keep it in a log. 2) Take your medicines as prescribed 3) Eat low salt foods-Limit salt (sodium) to 2000 mg per day.  4) Stay as active as you can everyday 5) Limit all fluids for the day to less than 2 liters 6)

## 2015-05-06 NOTE — Patient Instructions (Addendum)
Please bring by the SD card out of your bipap machine for download  Continue to wear bipap EVERY NIGHT  Try using it during the day while watching TV to help get used to it  Continue albuterol as needed  [facility] for you to start working - will print a note for work  Follow up with Dr. Halford Chessman in 3 months  NO SMOKING  Call for sooner follow up if needed

## 2015-05-06 NOTE — Progress Notes (Signed)
Patient ID: [name], male   DOB: 1985/12/06, 30 y.o.   MRN: [id number]  PCP: Dr Rich Reining Primary Cardiologist:Dr Aundra Dubin  Pulmonary  HPI: Mr Sill is a 30 yo with OHS/OSA, morbid obesity, HTN, diastolic CHF with prominent RV failure.   Admitted in August 2016  to [facility] with volume overload. Diuresed with lasix drip and diamox. Transitioned to lasix 80 mg twice a day. Discharge weight 257 pounds.   Recent admit to [facility] 3/21 through 04/28/15. Admitted with respiratory distress. Required short term intubation. Apparently had not been wearing BiPap. Discharge weight was 259 pounds.   He returns for  follow up. Wants to go back to work. Overall feeling much better. Saw pulmonary earlier today.  Denies SOB/PND/Orthopnea. Weight at home 261 pounds.  Using Bipap at nightly.  Currently not working. Not smoking or drinking alcohol. Drinking <2 liters of water.   ECHO 03/20/2014 EF 60-65%  Labs 09/21/2014: K 3.9 Creatinine 1.03  Labs 11/26/2014: K 4.2 Creatinine 1.08  Labs 12/30/2014: K 4.7 Creatinine 1.05  Labs 04/26/2015: K 4.0 Creatinine 0.94   ROS: All systems negative except as listed in HPI, PMH and Problem List.  SH:  Social History   Social History  . Marital Status: Single    Spouse Name: N/A  . Number of Children: 2  . Years of Education: N/A   Occupational History  . N/A    Social History Main Topics  . Smoking status: Former Smoker -- 1.50 packs/day for 3 years    Types: Cigarettes    Quit date: 10/31/2013  . Smokeless tobacco: Never Used  . Alcohol Use: No  . Drug Use: Yes    Special: Cocaine     Comment: 10/31/2013 "might use cocaine once/month" - Has not used since 2015.   Marland Kitchen Sexual Activity: No   Other Topics Concern  . Not on file   Social History Narrative    FH:  Family History  Problem Relation Age of Onset  . Heart disease Mother     Pacemaker  . Heart attack Neg Hx   . Stroke Neg Hx   . Hypertension Father   . Cancer Paternal Grandfather      Past Medical History  Diagnosis Date  . Hypertension   . Obesity   . Tobacco abuse   . Substance abuse 8/31/213    cociane "first time use"  . High cholesterol   . CHF (congestive heart failure) ([place])   . Chronic bronchitis ([place])   . OSA on CPAP   . Arthritis     "hands, feet" (10/31/2013)  . Pneumonia 09/2011    Current Outpatient Prescriptions  Medication Sig Dispense Refill  . albuterol (PROVENTIL HFA;VENTOLIN HFA) 108 (90 BASE) MCG/ACT inhaler Inhale 1-2 puffs into the lungs every 6 (six) hours as needed for wheezing or shortness of breath. 1 Inhaler 0  . aspirin 325 MG EC tablet Take 1 tablet (325 mg total) by mouth daily. 30 tablet 0  . carvedilol (COREG) 3.125 MG tablet Take 1 tablet (3.125 mg total) by mouth 2 (two) times daily with a meal. 60 tablet 11  . furosemide (LASIX) 40 MG tablet Take 2 tablets (80 mg total) by mouth 2 (two) times daily. 120 tablet 2  . lisinopril (PRINIVIL,ZESTRIL) 5 MG tablet Take 1 tablet (5 mg total) by mouth 2 (two) times daily. 180 tablet 3  . potassium chloride SA (K-DUR,KLOR-CON) 20 MEQ tablet Take 1 tablet (20 mEq total) by mouth daily. 30 tablet  0   No current facility-administered medications for this encounter.    Filed Vitals:   05/06/15 1336  BP: 140/90  Pulse: 74  Resp: 18  Weight: 261 lb 6.4 oz (118.57 kg)  SpO2: 94%    PHYSICAL EXAM:  General:  Well appearing. No resp difficulty. Ambulated in clinic without difficulty.  HEENT: normal Neck: supple. JVP ~5-6 . Carotids 2+ bilaterally; no bruits. No lymphadenopathy or thryomegaly appreciated. Cor: PMI normal. Regular rate & rhythm. No rubs, gallops or murmurs. Lungs: clear Abdomen: obese, soft, nontender, nonistended. No hepatosplenomegaly. No bruits or masses. Good bowel sounds. Extremities: no cyanosis, clubbing, rash, edema Neuro: alert & orientedx3, cranial nerves grossly intact. Moves all 4 extremities w/o difficulty. Affect pleasant.    ASSESSMENT & PLAN: 30  yo with OHS/OSA, morbid obesity, diastolic CHF with prominent RV failure.   1.Chronic Diastolic CHF with prominent RV failure: EF normal on last echo 02/2014 with moderate RV dilation/moderate RV systolic dysfunction.NYHA II.   Volume status stable. Continue lasix 80 mg twice a day.  Reinforced daily weights, low salt diet, and limiting fluid intake to < 2 liters per day.   2. OHS/OSA: Continue Bipap nightly. Saw pulmonary earlier today.    3. HTN-  Stable. Continue current regimen.  4. Morbid Obesity- Discussed portion control. Encouraged him to lose weight.    Follow up in  6 weeks.    [name] Clegg NP-C  1:34 PM  -

## 2015-05-07 NOTE — Progress Notes (Signed)
Reviewed and agree with assessment/plan.  [name], MD [facility] Pulmonary/Critical Care 05/07/2015, 8:20 AM Pager:  [phone]

## 2015-05-11 ENCOUNTER — Encounter: Payer: Self-pay | Admitting: Physician Assistant

## 2015-05-11 ENCOUNTER — Ambulatory Visit (INDEPENDENT_AMBULATORY_CARE_PROVIDER_SITE_OTHER): Payer: Medicaid Other | Admitting: Physician Assistant

## 2015-05-11 VITALS — BP 142/78 | HR 80 | Ht 64.0 in | Wt 260.8 lb

## 2015-05-11 DIAGNOSIS — I10 Essential (primary) hypertension: Secondary | ICD-10-CM | POA: Diagnosis not present

## 2015-05-11 DIAGNOSIS — I5032 Chronic diastolic (congestive) heart failure: Secondary | ICD-10-CM | POA: Diagnosis not present

## 2015-05-11 MED ORDER — CARVEDILOL 6.25 MG PO TABS: 6.2500 mg | ORAL_TABLET | Freq: Two times a day (BID) | ORAL | Status: DC

## 2015-05-11 NOTE — Progress Notes (Signed)
Cardiology Office Note    Date:  05/11/2015   ID:  Douglas Madura., DOB 06/01/1985, MRN [id number]  PCP:  No PCP Per Patient  Cardiologist:  Dr. Aundra Dubin  Chief complaint post hospital follow-up  History of Present Illness:  Douglas [name]. is a 30 y.o. male  with severe OSA/OHS, morbid obesity, hypertension, diastolic heart failure with prominent RV failure. He saw Douglas Grinder, NP in the heart failure clinic 05/06/15 after recent hospitalization with respiratory distress requiring short term intubation. Patient's discharge weight was 259 pounds. When he saw Douglas [name] he weighed 261 and today he weighs 260 pounds.  Overall the patient is feeling quite well. He is hoping to return to work. He is watching his diet closely and wants to start working out at the gym. He feels like his fluid status is stable and he weighs himself daily. He denies any worsening shortness of breath, dyspnea on exertion, edema, chest pain presyncope.    Past Medical History  Diagnosis Date  . Hypertension   . Obesity   . Tobacco abuse   . Substance abuse 8/31/213    cociane "first time use"  . High cholesterol   . CHF (congestive heart failure) ([place])   . Chronic bronchitis ([place])   . OSA on CPAP   . Arthritis     "hands, feet" (10/31/2013)  . Pneumonia 09/2011    Past Surgical History  Procedure Laterality Date  . No past surgeries    . Tubes in ears      Current Medications: Outpatient Prescriptions Prior to Visit  Medication Sig Dispense Refill  . albuterol (PROVENTIL HFA;VENTOLIN HFA) 108 (90 BASE) MCG/ACT inhaler Inhale 1-2 puffs into the lungs every 6 (six) hours as needed for wheezing or shortness of breath. 1 Inhaler 0  . aspirin 325 MG EC tablet Take 1 tablet (325 mg total) by mouth daily. 30 tablet 0  . furosemide (LASIX) 40 MG tablet Take 2 tablets (80 mg total) by mouth 2 (two) times daily. 120 tablet 2  . lisinopril (PRINIVIL,ZESTRIL) 5 MG tablet Take 1 tablet (5 mg total) by mouth  2 (two) times daily. 180 tablet 3  . potassium chloride SA (K-DUR,KLOR-CON) 20 MEQ tablet Take 1 tablet (20 mEq total) by mouth daily. 30 tablet 0  . carvedilol (COREG) 3.125 MG tablet Take 1 tablet (3.125 mg total) by mouth 2 (two) times daily with a meal. 60 tablet 11   No facility-administered medications prior to visit.     Allergies:   Review of patient's allergies indicates no known allergies.   Social History   Social History  . Marital Status: Single    Spouse Name: N/A  . Number of Children: 2  . Years of Education: N/A   Occupational History  . N/A    Social History Main Topics  . Smoking status: Former Smoker -- 1.50 packs/day for 3 years    Types: Cigarettes    Quit date: 10/31/2013  . Smokeless tobacco: Never Used  . Alcohol Use: No  . Drug Use: Yes    Special: Cocaine     Comment: 10/31/2013 "might use cocaine once/month" - Has not used since 2015.   Marland Kitchen Sexual Activity: No   Other Topics Concern  . None   Social History Narrative     Family History:  The patient's    family history includes Cancer in his paternal grandfather; Heart disease in his mother; Hypertension in his father. There is no history  of Heart attack or Stroke.   ROS:   Please see the history of present illness.    Review of Systems  Constitution: Negative.  HENT: Negative.   Eyes: Negative.   Cardiovascular: Positive for dyspnea on exertion.  Respiratory: Negative.   Skin: Negative.   Musculoskeletal: Negative.   Gastrointestinal: Negative.   Neurological: Negative.    All other systems reviewed and are negative.   PHYSICAL EXAM:   VS:  BP 142/78 mmHg  Pulse 80  Ht 5\' 4"  (1.626 m)  Wt 260 lb 12.8 oz (118.298 kg)  BMI 44.74 kg/m2   GEN: Obese in no acute distress Neck: no JVD, carotid bruits, or masses Cardiac:  RRR; positive S4, no murmurs,   or gallops,no edema  Respiratory:  clear to auscultation bilaterally, normal work of breathing GI: soft, nontender, nondistended, +  BS   Skin: warm and dry, no rash Neuro:  Alert and Oriented x 3, Strength and sensation are intact Psych: euthymic mood, full affect  Wt Readings from Last 3 Encounters:  05/11/15 260 lb 12.8 oz (118.298 kg)  05/06/15 261 lb 6.4 oz (118.57 kg)  05/06/15 262 lb 9.6 oz (119.115 kg)      Studies/Labs Reviewed:     Recent Labs: 06/09/2014: TSH 1.440 04/14/2015: ALT 21 04/19/2015: B Natriuretic Peptide 152.4* 04/20/2015: Magnesium 1.9 04/26/2015: BUN 18; Creatinine, Ser 0.94; Hemoglobin 16.5; Platelets 161; Potassium 4.0; Sodium 137   Lipid Panel    Component Value Date/Time   CHOL 144 09/18/2014 0514   TRIG 62 09/18/2014 0514   HDL 35* 09/18/2014 0514   CHOLHDL 4.1 09/18/2014 0514   VLDL 12 09/18/2014 0514   LDLCALC 97 09/18/2014 0514    Additional studies/ records that were reviewed today include:   2-D echo 03/20/14 Study Conclusions  - Left ventricle: The cavity size was normal. Wall thickness was   increased in a pattern of mild LVH. Systolic function was normal.   The estimated ejection fraction was in the range of 60% to 65%.   Wall motion was normal; there were no regional wall motion   abnormalities. - Left atrium: The atrium was mildly dilated. - Right ventricle: The cavity size was moderately dilated. Wall   thickness was normal. Systolic function was moderately reduced. - Right atrium: The atrium was severely dilated. - Pericardium, extracardiac: A trivial pericardial effusion was   identified. There was no evidence of hemodynamic compromise.  Impressions:  - When compared to 10/2013, right sided pressures have increased.   Right ventricle remains dilated.    ASSESSMENT:    Chronic diastolic CHF (congestive heart failure), NYHA class 3 (HCC) Patient's heart failure is stable today on exam. His pressure is up slightly. We'll titrate Coreg up to 6.25 mg twice a day. Follow-up with heart failure clinic as scheduled in one month. 2 g sodium diet. 2 L fluid  restriction. Weight himself daily.  Essential hypertension Blood pressure up a little. Increase Coreg       Medication Adjustments/Labs and Tests Ordered: Current medicines are reviewed at length with the patient today.  Concerns regarding medicines are outlined above.  Medication changes, Labs and Tests ordered today are listed in the Patient Instructions below. Patient Instructions  Nena Polio, PA-C, has recommended making the following medication changes: 1. INCREASE Carvedilol (Coreg) to 6.25 mg twice daily.  Please keep follow-up appointment in the heart failure clinic.  If you need a refill on your cardiac medications before your next appointment, please call your pharmacy.  Sumner Boast, PA-C  05/11/2015 8:32 AM    [facility] [place], [place], [place]  [address] Phone: [phone]; Fax: [phone]

## 2015-05-11 NOTE — Patient Instructions (Signed)
Nena Polio, PA-C, has recommended making the following medication changes: 1. INCREASE Carvedilol (Coreg) to 6.25 mg twice daily.  Please keep follow-up appointment in the heart failure clinic.  If you need a refill on your cardiac medications before your next appointment, please call your pharmacy.

## 2015-05-11 NOTE — Assessment & Plan Note (Signed)
Patient's heart failure is stable today on exam. His pressure is up slightly. We'll titrate Coreg up to 6.25 mg twice a day. Follow-up with heart failure clinic as scheduled in one month. 2 g sodium diet. 2 L fluid restriction. Weight himself daily.

## 2015-05-11 NOTE — Assessment & Plan Note (Signed)
Blood pressure up a little. Increase Coreg

## 2015-05-13 ENCOUNTER — Ambulatory Visit: Payer: Medicaid Other | Admitting: Pulmonary Disease

## 2015-06-17 ENCOUNTER — Encounter: Payer: Self-pay | Admitting: Licensed Clinical Social Worker

## 2015-06-17 ENCOUNTER — Ambulatory Visit (HOSPITAL_COMMUNITY)
Admission: RE | Admit: 2015-06-17 | Discharge: 2015-06-17 | Disposition: A | Discharge status: Home / Self Care | Payer: Medicaid Other | Source: Ambulatory Visit | Attending: Cardiology | Admitting: Cardiology

## 2015-06-17 VITALS — BP 160/100 | HR 78 | Wt 270.0 lb

## 2015-06-17 DIAGNOSIS — E7800 Pure hypercholesterolemia, unspecified: Secondary | ICD-10-CM | POA: Insufficient documentation

## 2015-06-17 DIAGNOSIS — I10 Essential (primary) hypertension: Secondary | ICD-10-CM | POA: Diagnosis not present

## 2015-06-17 DIAGNOSIS — I5032 Chronic diastolic (congestive) heart failure: Secondary | ICD-10-CM | POA: Diagnosis present

## 2015-06-17 DIAGNOSIS — E662 Morbid (severe) obesity with alveolar hypoventilation: Secondary | ICD-10-CM | POA: Diagnosis not present

## 2015-06-17 DIAGNOSIS — Z87891 Personal history of nicotine dependence: Secondary | ICD-10-CM | POA: Insufficient documentation

## 2015-06-17 DIAGNOSIS — R05 Cough: Secondary | ICD-10-CM | POA: Insufficient documentation

## 2015-06-17 DIAGNOSIS — Z79899 Other long term (current) drug therapy: Secondary | ICD-10-CM | POA: Insufficient documentation

## 2015-06-17 DIAGNOSIS — I110 Hypertensive heart disease with heart failure: Secondary | ICD-10-CM | POA: Diagnosis not present

## 2015-06-17 DIAGNOSIS — Z8249 Family history of ischemic heart disease and other diseases of the circulatory system: Secondary | ICD-10-CM | POA: Insufficient documentation

## 2015-06-17 DIAGNOSIS — G4733 Obstructive sleep apnea (adult) (pediatric): Secondary | ICD-10-CM | POA: Diagnosis not present

## 2015-06-17 DIAGNOSIS — I5033 Acute on chronic diastolic (congestive) heart failure: Secondary | ICD-10-CM | POA: Diagnosis not present

## 2015-06-17 MED ORDER — LOSARTAN POTASSIUM 25 MG PO TABS: 25.0000 mg | ORAL_TABLET | Freq: Two times a day (BID) | ORAL | Status: DC

## 2015-06-17 NOTE — Progress Notes (Signed)
CSW referred to assist obtaining a PCP. Patient reports he has medicaid and previously had a PCP but has not seen in quite some time. Patient does not have his medicaid card on his person but will find it at home and return call to [place]. Patient may already be auto assigned to PCP on his medicaid card. Patient states he has all his medications, maintaining a low salt diet as best he can and denies any other needs at this time.  CSW will await return call from patient to further assist with PCP. Raquel Sarna, LCSW [phone]

## 2015-06-17 NOTE — Progress Notes (Signed)
Patient ID: Douglas Madura., male   DOB: 01-Jun-1985, 30 y.o.   MRN: [id number]  PCP: none.  Primary Cardiologist:Dr Aundra Dubin  Pulmonary  HPI: Douglas [name] is a 30 yo with OHS/OSA, morbid obesity, HTN, diastolic CHF with prominent RV failure.   Admitted in August 2016  to [facility] with volume overload. Diuresed with lasix drip and diamox. Transitioned to lasix 80 mg twice a day. Discharge weight 257 pounds.   Recent admit to [facility] 3/21 through 04/28/15. Admitted with respiratory distress. Required short term intubation. Apparently had not been wearing BiPap. Discharge weight was 259 pounds.   He returns for  follow up. Overall feeling ok.  Denies SOB/PND/Orthopnea. Having dry cough. Weight at home 270 pounds. Using Bipap at nightly.  Currently not working. Not smoking or drinking alcohol. Drinking <2 liters of water. Taking all medications but only taking lasix 40 mg twice a day instead of 80 mg twice a day.   ECHO 03/20/2014 EF 60-65%  Labs 09/21/2014: K 3.9 Creatinine 1.03  Labs 11/26/2014: K 4.2 Creatinine 1.08  Labs 12/30/2014: K 4.7 Creatinine 1.05  Labs 04/26/2015: K 4.0 Creatinine 0.94   ROS: All systems negative except as listed in HPI, PMH and Problem List.  SH:  Social History   Social History  . Marital Status: Single    Spouse Name: N/A  . Number of Children: 2  . Years of Education: N/A   Occupational History  . N/A    Social History Main Topics  . Smoking status: Former Smoker -- 1.50 packs/day for 3 years    Types: Cigarettes    Quit date: 10/31/2013  . Smokeless tobacco: Never Used  . Alcohol Use: No  . Drug Use: Yes    Special: Cocaine     Comment: 10/31/2013 "might use cocaine once/month" - Has not used since 2015.   Marland Kitchen Sexual Activity: No   Other Topics Concern  . Not on file   Social History Narrative    FH:  Family History  Problem Relation Age of Onset  . Heart disease Mother     Pacemaker  . Heart attack Neg Hx   . Stroke Neg Hx   . Hypertension  Father   . Cancer Paternal Grandfather     Past Medical History  Diagnosis Date  . Hypertension   . Obesity   . Tobacco abuse   . Substance abuse 8/31/213    cociane "first time use"  . High cholesterol   . CHF (congestive heart failure) ([place])   . Chronic bronchitis ([place])   . OSA on CPAP   . Arthritis     "hands, feet" (10/31/2013)  . Pneumonia 09/2011    Current Outpatient Prescriptions  Medication Sig Dispense Refill  . albuterol (PROVENTIL HFA;VENTOLIN HFA) 108 (90 BASE) MCG/ACT inhaler Inhale 1-2 puffs into the lungs every 6 (six) hours as needed for wheezing or shortness of breath. 1 Inhaler 0  . carvedilol (COREG) 6.25 MG tablet Take 1 tablet (6.25 mg total) by mouth 2 (two) times daily with a meal. 60 tablet 11  . furosemide (LASIX) 40 MG tablet Take 2 tablets (80 mg total) by mouth 2 (two) times daily. (Patient taking differently: Take 40 mg by mouth 2 (two) times daily. ) 120 tablet 2  . lisinopril (PRINIVIL,ZESTRIL) 5 MG tablet Take 1 tablet (5 mg total) by mouth 2 (two) times daily. 180 tablet 3  . potassium chloride SA (K-DUR,KLOR-CON) 20 MEQ tablet Take 1 tablet (20 mEq total) by  mouth daily. 30 tablet 0   No current facility-administered medications for this encounter.    Filed Vitals:   06/17/15 1352  BP: 160/100  Pulse: 78  Weight: 270 lb (122.471 kg)  SpO2: 93%    PHYSICAL EXAM:  General:  Well appearing. No resp difficulty. Ambulated in clinic without difficulty.  HEENT: normal Neck: supple. JVP ~5-6 . Carotids 2+ bilaterally; no bruits. No lymphadenopathy or thryomegaly appreciated. Cor: PMI normal. Regular rate & rhythm. No rubs, gallops or murmurs. Lungs: clear Abdomen: obese, soft, nontender, nonistended. No hepatosplenomegaly. No bruits or masses. Good bowel sounds. Extremities: no cyanosis, clubbing, rash, edema Neuro: alert & orientedx3, cranial nerves grossly intact. Moves all 4 extremities w/o difficulty. Affect pleasant.    ASSESSMENT &  PLAN: 30 yo with OHS/OSA, morbid obesity, diastolic CHF with prominent RV failure.   1.Chronic Diastolic CHF with prominent RV failure: EF normal on last echo 02/2014 with moderate RV dilation/moderate RV systolic dysfunction.NYHA II.   Volume status stable. Continue lasix 40 twice a day.   Reinforced daily weights, low salt diet, and limiting fluid intake to < 2 liters per day.   2. OHS/OSA: Continue Bipap nightly. Followed by pulmonary.     3. HTN- Stop lisinopril due to cough and start losartan 25 mg twice a day.  4. Cough- dry. Stop lisinopril and switch to losartan 25 mg twice a day.  5. Morbid Obesity- Needs to lose weight. Discussed portion control.   Follow up in 10 days with BMET and will check BP at that time.  Follow up in 8 weeks.   Chuckie Mccathern NP-C  2:40 PM

## 2015-06-17 NOTE — Patient Instructions (Signed)
STOP Lisinopril START Losartan 25 mg, one tab twice a day  Labs and b/p check in 10 day (07/03/15 @ 930)  Your physician recommends that you schedule a follow-up appointment in: 6 weeks  Do the following things EVERYDAY: 1) Weigh yourself in the morning before breakfast. Write it down and keep it in a log. 2) Take your medicines as prescribed 3) Eat low salt foods-Limit salt (sodium) to 2000 mg per day.  4) Stay as active as you can everyday 5) Limit all fluids for the day to less than 2 liters 6)

## 2015-06-18 ENCOUNTER — Telehealth: Payer: Self-pay | Admitting: Licensed Clinical Social Worker

## 2015-06-18 NOTE — Telephone Encounter (Signed)
CSW has not heard back from patient who was suppose to return call yesterday regarding medicaid card and provider listed on card. CSW attempted to follow up with patient regarding medicaid card and PCP although no answer and unable to leave message. Douglas Sarna, LCSW [phone]

## 2015-07-03 ENCOUNTER — Other Ambulatory Visit (HOSPITAL_COMMUNITY): Payer: Medicaid Other

## 2015-07-06 ENCOUNTER — Other Ambulatory Visit (HOSPITAL_COMMUNITY): Payer: Medicaid Other

## 2015-07-10 ENCOUNTER — Telehealth: Payer: Self-pay | Admitting: Cardiology

## 2015-07-10 NOTE — Telephone Encounter (Signed)
Left message for Douglas [name], will call back first of the week.

## 2015-07-10 NOTE — Telephone Encounter (Signed)
New message      Calling to check status on paper work faxed June 6th week---it is for patient to continue home wt monitoring

## 2015-07-16 NOTE — Telephone Encounter (Signed)
Follow up      Calling to check the status on paperwork faxed June 6 regarding home wt monitoring.  Please call

## 2015-07-17 NOTE — Telephone Encounter (Signed)
Left message for anne, pt is followed in the advanced heart failure clinic, paperwork was faxed to them. Their telephone number given to anne.

## 2015-07-29 ENCOUNTER — Encounter (HOSPITAL_COMMUNITY): Payer: Medicaid Other

## 2015-08-04 ENCOUNTER — Telehealth (HOSPITAL_COMMUNITY): Payer: Self-pay

## 2015-08-04 NOTE — Telephone Encounter (Signed)
Bryson Dames with [employer] called to report patient is currently in process of moving and as such telemonitoring services will be delayed until he calls to have equipment placed at new residence.  Renee Pain

## 2015-08-11 ENCOUNTER — Encounter: Payer: Self-pay | Admitting: Pulmonary Disease

## 2015-08-11 ENCOUNTER — Ambulatory Visit (INDEPENDENT_AMBULATORY_CARE_PROVIDER_SITE_OTHER): Payer: Medicaid Other | Admitting: Pulmonary Disease

## 2015-08-11 VITALS — BP 136/90 | HR 87 | Ht 64.0 in | Wt 277.4 lb

## 2015-08-11 DIAGNOSIS — E662 Morbid (severe) obesity with alveolar hypoventilation: Secondary | ICD-10-CM | POA: Diagnosis not present

## 2015-08-11 DIAGNOSIS — J9612 Chronic respiratory failure with hypercapnia: Secondary | ICD-10-CM

## 2015-08-11 DIAGNOSIS — G4733 Obstructive sleep apnea (adult) (pediatric): Secondary | ICD-10-CM | POA: Diagnosis not present

## 2015-08-11 DIAGNOSIS — I2781 Cor pulmonale (chronic): Secondary | ICD-10-CM | POA: Diagnosis not present

## 2015-08-11 DIAGNOSIS — I5032 Chronic diastolic (congestive) heart failure: Secondary | ICD-10-CM

## 2015-08-11 DIAGNOSIS — J9611 Chronic respiratory failure with hypoxia: Secondary | ICD-10-CM

## 2015-08-11 NOTE — Patient Instructions (Signed)
Will have your BiPAP setting changed to 21/17 cm H2O Will arrange for new mask and supplies Wearing your oxygen during activities and when asleep with BiPAP  Follow up in 2 months with Dr. Halford Chessman or Nurse Practitioner

## 2015-08-11 NOTE — Progress Notes (Signed)
Current Outpatient Prescriptions on File Prior to Visit  Medication Sig  . albuterol (PROVENTIL HFA;VENTOLIN HFA) 108 (90 BASE) MCG/ACT inhaler Inhale 1-2 puffs into the lungs every 6 (six) hours as needed for wheezing or shortness of breath.  . carvedilol (COREG) 6.25 MG tablet Take 1 tablet (6.25 mg total) by mouth 2 (two) times daily with a meal.  . furosemide (LASIX) 40 MG tablet Take 2 tablets (80 mg total) by mouth 2 (two) times daily. (Patient taking differently: Take 40 mg by mouth 2 (two) times daily. )  . losartan (COZAAR) 25 MG tablet Take 1 tablet (25 mg total) by mouth 2 (two) times daily.  . potassium chloride SA (K-DUR,KLOR-CON) 20 MEQ tablet Take 1 tablet (20 mEq total) by mouth daily.   No current facility-administered medications on file prior to visit.    Chief Complaint  Patient presents with  . Follow-up    Wears CPAP nightly but only ranges about 1.5 hours. Denies problems with mask. Feels that the pressure setting is not set high enough. DME: [employer]; Pt c/o some chest discomfort in right chest when he coughs x 2 weeks. Cough is dry.      Sleep tests PSG 08/14/09 >> AHI 105.4, SaO2 low 56%, BiPAP 21/17 cm H2O Split 02/05/14 >> AHI 78.2, SaO2 low 52%. CPAP 13 cm H2O >> AHI 5.4, still had low SaO2 >> centrals with higher CPAP/EPAP. ONO with 5 liters 02/27/14 >> test time 58 min. Basal SpO2 66%, low SpO2 50%. Spent 55 min with SpO2 < 88%.  Pulmonary tests V/Q scan 11/01/13 >> upper lobe airtrapping b/l ABG 04/28/15 >> pH 7.37, PCO2 55.5, PO2 65.6  Cardiac tests Echo 11/01/13 >> severe LVH, EF 55 to 60%, mod RA/RV dilation Echo 03/20/14 >> mild LVH, EF 60 to 123456, mod RV systolic dysfx  Past medical history HTN, Diastolic CHF, HLD, ACE inhibitor cough  Past surgical hx, Medications, Allergies, Family hx, Social hx all reviewed.  Vital signs BP 136/90 mmHg  Pulse 87  Ht 5\' 4"  (1.626 m)  Wt 277 lb 6.4 oz (125.828 kg)  BMI 47.59 kg/m2  SpO2 91%   History of  Present Illness: Douglas [name]. is a 30 y.o. male former smoker with chronic respiratory failure from OSA, OHS, cor pulmonale, and diastolic CHF.  He has been trying to use BiPAP.  He is on auto setting.  He doesn't feel like pressure setting is high enough . He also has trouble with mask fit, and mask keeps coming off.  He has oxygen at home.  He uses when asleep with BiPAP, and sometimes during the day "when he feels like he needs it".  He couldn't really define when he feels like he needs it, but has a portable pulse oximeter >> he sometimes goes down to low 80's.  He developed cough about 2 weeks ago.  Not bringing up sputum.  Feels this is getting better.  He is working 3 rd shift at [employer].  Physical Exam:  General - no distress ENT - No sinus tenderness, no oral exudate, no LAN, MP 4 Cardiac - s1s2 regular, no murmur Chest - no wheeze/crackles Back - No focal tenderness Abd - soft, non tender Ext - no edema Neuro - Normal strength Skin - no rashes Psych - pleasant   Assessment/Plan:  Obstructive sleep apnea. - will change him back to fixed BiPAP 21/17 cm  - advised him to use BiPAP whenever he is asleep  Chronic hypoxic/hypercapnic respiratory failure  with cor pulmonale 2nd to besity hypoventilation syndrome. - explained importance of using oxygen - he is to continue 3 liters oxygen with exertion and at night with BiPAP  Morbid obesity - reviewed importance of weight loss and discussed techniques to help with this  Diastolic heart failure - f/u with PCP and cardiology  Cough. - exam unremarkable - prn albuterol   Patient Instructions  Will have your BiPAP setting changed to 21/17 cm H2O Will arrange for new mask and supplies Wearing your oxygen during activities and when asleep with BiPAP  Follow up in 2 months with Dr. Halford Chessman or Nurse Practitioner     [name], MD [place] Pager:  [phone] 08/11/2015, 10:22  AM

## 2015-08-20 ENCOUNTER — Encounter (HOSPITAL_COMMUNITY): Payer: Medicaid Other

## 2015-09-01 ENCOUNTER — Ambulatory Visit (HOSPITAL_COMMUNITY)
Admission: RE | Admit: 2015-09-01 | Discharge: 2015-09-01 | Disposition: A | Discharge status: Home / Self Care | Payer: Medicaid Other | Source: Ambulatory Visit | Attending: Internal Medicine | Admitting: Internal Medicine

## 2015-09-01 ENCOUNTER — Encounter (HOSPITAL_COMMUNITY): Payer: Self-pay

## 2015-09-01 VITALS — BP 168/104 | HR 87 | Wt 278.8 lb

## 2015-09-01 DIAGNOSIS — I5032 Chronic diastolic (congestive) heart failure: Secondary | ICD-10-CM | POA: Insufficient documentation

## 2015-09-01 DIAGNOSIS — Z87891 Personal history of nicotine dependence: Secondary | ICD-10-CM | POA: Insufficient documentation

## 2015-09-01 DIAGNOSIS — E662 Morbid (severe) obesity with alveolar hypoventilation: Secondary | ICD-10-CM | POA: Diagnosis not present

## 2015-09-01 DIAGNOSIS — Z79899 Other long term (current) drug therapy: Secondary | ICD-10-CM | POA: Diagnosis not present

## 2015-09-01 DIAGNOSIS — I110 Hypertensive heart disease with heart failure: Secondary | ICD-10-CM | POA: Diagnosis not present

## 2015-09-01 DIAGNOSIS — G4733 Obstructive sleep apnea (adult) (pediatric): Secondary | ICD-10-CM | POA: Diagnosis not present

## 2015-09-01 DIAGNOSIS — I10 Essential (primary) hypertension: Secondary | ICD-10-CM | POA: Diagnosis not present

## 2015-09-01 DIAGNOSIS — Z6841 Body Mass Index (BMI) 40.0 and over, adult: Secondary | ICD-10-CM | POA: Insufficient documentation

## 2015-09-01 DIAGNOSIS — E7800 Pure hypercholesterolemia, unspecified: Secondary | ICD-10-CM | POA: Diagnosis not present

## 2015-09-01 DIAGNOSIS — Z8249 Family history of ischemic heart disease and other diseases of the circulatory system: Secondary | ICD-10-CM | POA: Insufficient documentation

## 2015-09-01 LAB — BASIC METABOLIC PANEL
Anion gap: 11 (ref 5–15)
BUN: 13 mg/dL (ref 6–20)
CALCIUM: 9.2 mg/dL (ref 8.9–10.3)
CO2: 25 mmol/L (ref 22–32)
CREATININE: 1.06 mg/dL (ref 0.61–1.24)
Chloride: 100 mmol/L — ABNORMAL LOW (ref 101–111)
GFR calc Af Amer: >60 mL/min (ref >60)
GLUCOSE: 94 mg/dL (ref 65–99)
Potassium: 4 mmol/L (ref 3.5–5.1)
SODIUM: 136 mmol/L (ref 135–145)

## 2015-09-01 MED ORDER — LOSARTAN POTASSIUM 25 MG PO TABS: 25.0000 mg | ORAL_TABLET | Freq: Two times a day (BID) | ORAL | 3 refills | Status: DC

## 2015-09-01 NOTE — Progress Notes (Signed)
Patient ID: Douglas [name]., male   DOB: 1985/04/11, 30 y.o.   MRN: [id number]    Advanced Heart Failure Clinic Note   PCP: none.  Primary Cardiologist:Dr Aundra Dubin  Pulmonary  HPI: Douglas [name] is a 30 yo with OHS/OSA, morbid obesity, HTN, diastolic CHF with prominent RV failure.   Admitted in August 2016  to [facility] with volume overload. Diuresed with lasix drip and diamox. Transitioned to lasix 80 mg twice a day. Discharge weight 257 pounds.   Recent admit to [facility] 3/21 through 04/28/15. Admitted with respiratory distress. Required short term intubation. Apparently had not been wearing BiPap. Discharge weight was 259 pounds.   He returns for  follow up. BP elevated in the setting of no sleep ( works from 10 p to 7 a) and he has also been taking his losartan only once daily.   Overall feels great. Denies SOB/PND/Orthopnea. Cough one with switch to losartan.  Doesn't weight daily at home.  Says his scale is broken.  No smoking. Has occasional alcohol. Is back at work, works a full shift without having to take any extra breaks. Watching his fluid and salt.   ECHO 03/20/2014 EF 60-65%  Labs 09/21/2014: K 3.9 Creatinine 1.03  Labs 11/26/2014: K 4.2 Creatinine 1.08  Labs 12/30/2014: K 4.7 Creatinine 1.05  Labs 04/26/2015: K 4.0 Creatinine 0.94   ROS: All systems negative except as listed in HPI, PMH and Problem List.  SH:  Social History   Social History  . Marital status: Single    Spouse name: N/A  . Number of children: 2  . Years of education: N/A   Occupational History  . N/A Not Employed   Social History Main Topics  . Smoking status: Former Smoker    Packs/day: 1.50    Years: 3.00    Types: Cigarettes    Quit date: 10/31/2013  . Smokeless tobacco: Never Used  . Alcohol use No  . Drug use:     Types: Cocaine     Comment: 10/31/2013 "might use cocaine once/month" - Has not used since 2015.   Marland Kitchen Sexual activity: No   Other Topics Concern  . Not on file   Social History  Narrative  . No narrative on file    FH:  Family History  Problem Relation Age of Onset  . Heart disease Mother     Pacemaker  . Heart attack Neg Hx   . Stroke Neg Hx   . Hypertension Father   . Cancer Paternal Grandfather     Past Medical History:  Diagnosis Date  . Arthritis    "hands, feet" (10/31/2013)  . CHF (congestive heart failure) ([place])   . Chronic bronchitis ([place])   . High cholesterol   . Hypertension   . Obesity   . OSA on CPAP   . Pneumonia 09/2011  . Substance abuse 8/31/213   cociane "first time use"  . Tobacco abuse     Current Outpatient Prescriptions  Medication Sig Dispense Refill  . albuterol (PROVENTIL HFA;VENTOLIN HFA) 108 (90 BASE) MCG/ACT inhaler Inhale 1-2 puffs into the lungs every 6 (six) hours as needed for wheezing or shortness of breath. 1 Inhaler 0  . carvedilol (COREG) 6.25 MG tablet Take 1 tablet (6.25 mg total) by mouth 2 (two) times daily with a meal. 60 tablet 11  . furosemide (LASIX) 40 MG tablet Take 40 mg by mouth 2 (two) times daily.    Marland Kitchen losartan (COZAAR) 25 MG tablet Take 1 tablet (  25 mg total) by mouth 2 (two) times daily. 60 tablet 3  . potassium chloride SA (K-DUR,KLOR-CON) 20 MEQ tablet Take 1 tablet (20 mEq total) by mouth daily. 30 tablet 0   No current facility-administered medications for this encounter.     Vitals:   09/01/15 1003  BP: (!) 168/104  Pulse: 87  SpO2: 94%  Weight: 278 lb 12.8 oz (126.5 kg)   Wt Readings from Last 3 Encounters:  09/01/15 278 lb 12.8 oz (126.5 kg)  08/11/15 277 lb 6.4 oz (125.8 kg)  06/17/15 270 lb (122.5 kg)     PHYSICAL EXAM:  General:  Well appearing. No resp difficulty. Ambulated in clinic without difficulty.  HEENT: normal Neck: supple. JVP 6-7 . Carotids 2+ bilaterally; no bruits. No thyromegaly or nodule noted.  Cor: PMI normal. RRR. No M/G/R.  Lungs: CTAB, normal effort Abdomen: obese, soft, NT, ND, no HSM. No bruits or masses. +BS  Extremities: no cyanosis, clubbing,  rash, edema Neuro: alert & orientedx3, cranial nerves grossly intact. Moves all 4 extremities w/o difficulty. Affect pleasant.   ASSESSMENT & PLAN: 30 yo with OHS/OSA, morbid obesity, diastolic CHF with prominent RV failure.   1.Chronic Diastolic CHF with prominent RV failure: EF normal on last echo 02/2014 with moderate RV dilation/moderate RV systolic dysfunction.NYHA II.   - Volume status stable. Continue lasix 40 twice a day.   Reinforced daily weights, low salt diet, and limiting fluid intake to < 2 liters per day.  2. OHS/OSA:  - Continue Bipap nightly. - Followed by pulmonary.     3. HTN - Increase losartan 25 mg BID, has only been taking one a day.  4. Morbid Obesity - Needs to lose weight. Now back at work. Needs to watch portions and increase activity as able.   BMET today. Follow up 2 months. Med changes as above.   Douglas Mccallum Toure Edmonds PA-C 10:05 AM   Total time spent >25 minutes. Over half that spent discussing the above.

## 2015-09-01 NOTE — Patient Instructions (Signed)
INCREASE Losartan to 25 mg, one tab twice a day  Labs today  Your physician recommends that you schedule a follow-up appointment in: 2 months

## 2015-10-07 ENCOUNTER — Telehealth: Payer: Self-pay | Admitting: Pulmonary Disease

## 2015-10-07 NOTE — Telephone Encounter (Signed)
lmtcb x1 for pt. 

## 2015-10-08 NOTE — Telephone Encounter (Signed)
lmtcb x2 for pt. 

## 2015-10-09 NOTE — Telephone Encounter (Signed)
lmtcb x3 

## 2015-10-12 ENCOUNTER — Ambulatory Visit: Payer: Medicaid Other | Admitting: Adult Health

## 2015-10-20 ENCOUNTER — Ambulatory Visit: Payer: Medicaid Other | Admitting: Adult Health

## 2015-10-23 ENCOUNTER — Encounter (HOSPITAL_COMMUNITY): Payer: Self-pay | Admitting: *Deleted

## 2015-10-23 ENCOUNTER — Emergency Department (HOSPITAL_COMMUNITY): Payer: Medicaid Other

## 2015-10-23 ENCOUNTER — Emergency Department (HOSPITAL_COMMUNITY)
Admission: EM | Admit: 2015-10-23 | Discharge: 2015-10-23 | Disposition: A | Discharge status: Home / Self Care | Payer: Medicaid Other | Attending: Physician Assistant | Admitting: Physician Assistant

## 2015-10-23 DIAGNOSIS — I110 Hypertensive heart disease with heart failure: Secondary | ICD-10-CM | POA: Diagnosis not present

## 2015-10-23 DIAGNOSIS — M79671 Pain in right foot: Secondary | ICD-10-CM | POA: Diagnosis not present

## 2015-10-23 DIAGNOSIS — W1830XA Fall on same level, unspecified, initial encounter: Secondary | ICD-10-CM | POA: Diagnosis not present

## 2015-10-23 DIAGNOSIS — Z79899 Other long term (current) drug therapy: Secondary | ICD-10-CM | POA: Diagnosis not present

## 2015-10-23 DIAGNOSIS — Y929 Unspecified place or not applicable: Secondary | ICD-10-CM | POA: Insufficient documentation

## 2015-10-23 DIAGNOSIS — Z87891 Personal history of nicotine dependence: Secondary | ICD-10-CM | POA: Diagnosis not present

## 2015-10-23 DIAGNOSIS — I5032 Chronic diastolic (congestive) heart failure: Secondary | ICD-10-CM | POA: Diagnosis not present

## 2015-10-23 DIAGNOSIS — Y939 Activity, unspecified: Secondary | ICD-10-CM | POA: Insufficient documentation

## 2015-10-23 DIAGNOSIS — Y999 Unspecified external cause status: Secondary | ICD-10-CM | POA: Insufficient documentation

## 2015-10-23 MED ORDER — TRAMADOL HCL 50 MG PO TABS: 50.0000 mg | ORAL_TABLET | Freq: Four times a day (QID) | ORAL | 0 refills | Status: DC | PRN

## 2015-10-23 MED ORDER — NAPROXEN 500 MG PO TABS: 500.0000 mg | ORAL_TABLET | Freq: Once | ORAL | Status: DC

## 2015-10-23 MED ORDER — LOSARTAN POTASSIUM 25 MG PO TABS
25.0000 mg | ORAL_TABLET | Freq: Once | ORAL | Status: AC
  Administered 2015-10-23: 25 mg via ORAL
  Filled 2015-10-23: qty 1

## 2015-10-23 MED ORDER — CARVEDILOL 6.25 MG PO TABS
6.2500 mg | ORAL_TABLET | Freq: Once | ORAL | Status: AC
  Administered 2015-10-23: 6.25 mg via ORAL
  Filled 2015-10-23: qty 1

## 2015-10-23 MED ORDER — TRAMADOL HCL 50 MG PO TABS
50.0000 mg | ORAL_TABLET | Freq: Once | ORAL | Status: AC
  Administered 2015-10-23: 50 mg via ORAL
  Filled 2015-10-23: qty 1

## 2015-10-23 NOTE — ED Notes (Signed)
Bed: [id number] Expected date:  Expected time:  Means of arrival:  Comments: EMS 30yo M rt foot pain / no injury

## 2015-10-23 NOTE — ED Provider Notes (Signed)
[facility] DEPT Provider Note   CSN: [id number] Arrival date & time: 10/23/15  2118     History   Chief Complaint Chief Complaint  Patient presents with  . Foot Pain    HPI Douglas [name]. is a 29 y.o. male.  The history is provided by the patient and medical records. No language interpreter was used.   Douglas [name]. is a 30 y.o. male  with a PMH of arthritis, CHF, HTN, HLD, OSA on CPAP presents to the Emergency Department complaining of right foot pain that began when he awoke this morning. Patient states that pain has progressively worsened as he walked on it throughout the day. Pain worse with ambulation and movement, especially foot inversion. No known injury or trauma. No history of similar sxs. No warmth or redness. No fevers.    Past Medical History:  Diagnosis Date  . Arthritis    "hands, feet" (10/31/2013)  . CHF (congestive heart failure) ([place])   . Chronic bronchitis ([place])   . High cholesterol   . Hypertension   . Obesity   . OSA on CPAP   . Pneumonia 09/2011  . Substance abuse 8/31/213   cociane "first time use"  . Tobacco abuse      Patient Active Problem List   Diagnosis Date Noted  . Morbid obesity ([place]) 06/17/2015  . Essential hypertension 04/14/2015  . Hyperkalemia 04/14/2015  . OSA (obstructive sleep apnea)   . Difficult intravenous access   . Chronic diastolic CHF (congestive heart failure), NYHA class 3 ([place]) 09/13/2014  . Cor pulmonale ([place]) 03/19/2014  . OSA treated with BiPAP     Past Surgical History:  Procedure Laterality Date  . NO PAST SURGERIES    . TUBES IN EARS         Home Medications    Prior to Admission medications   Medication Sig Start Date End Date Taking? Authorizing Provider  albuterol (PROVENTIL HFA;VENTOLIN HFA) 108 (90 BASE) MCG/ACT inhaler Inhale 1-2 puffs into the lungs every 6 (six) hours as needed for wheezing or shortness of breath. 11/05/13   Shanker Kristeen Mans, MD  carvedilol (COREG) 6.25  MG tablet Take 1 tablet (6.25 mg total) by mouth 2 (two) times daily with a meal. 05/11/15   Imogene Burn, PA-C  furosemide (LASIX) 40 MG tablet Take 40 mg by mouth 2 (two) times daily.    Historical Provider, MD  losartan (COZAAR) 25 MG tablet Take 1 tablet (25 mg total) by mouth 2 (two) times daily. 09/01/15   Shirley Friar, PA-C  potassium chloride SA (K-DUR,KLOR-CON) 20 MEQ tablet Take 1 tablet (20 mEq total) by mouth daily. 09/21/14   Nishant Dhungel, MD  traMADol (ULTRAM) 50 MG tablet Take 1 tablet (50 mg total) by mouth every 6 (six) hours as needed. 10/23/15   [place], PA-C    Family History Family History  Problem Relation Age of Onset  . Heart disease Mother     Pacemaker  . Hypertension Father   . Cancer Paternal Grandfather   . Heart attack Neg Hx   . Stroke Neg Hx     Social History Social History  Substance Use Topics  . Smoking status: Former Smoker    Packs/day: 1.50    Years: 3.00    Types: Cigarettes    Quit date: 10/31/2013  . Smokeless tobacco: Never Used  . Alcohol use No     Allergies   Lisinopril   Review of Systems  Review of Systems  Constitutional: Negative for fever.  Musculoskeletal: Positive for arthralgias and myalgias. Negative for joint swelling.  Skin: Negative for color change.  Neurological: Negative for weakness and numbness.     Physical Exam Updated Vital Signs BP (!) 144/104 (BP Location: Right Arm)   Pulse 82   Temp 99 F (37.2 C) (Oral)   Resp 18   Ht 5\' 4"  (1.626 m)   Wt 122.5 kg   SpO2 90%   BMI 46.35 kg/m   Physical Exam  Constitutional: He is oriented to person, place, and time. He appears well-developed and well-nourished. No distress.  HENT:  Head: Normocephalic and atraumatic.  Cardiovascular: Normal rate, regular rhythm and normal heart sounds.   Pulmonary/Chest: Effort normal and breath sounds normal. No respiratory distress.  Musculoskeletal:       Right ankle: Achilles tendon normal.        Feet:  Right foot/ankle: No gross deformity noted. Full ROM: pain with ankle inversion. There is no joint effusion noted. No erythema or warmth overlaying the joint. TTP as depicted in image. 2+ DP pulses, sensation intact to medial, lateral, dorsal and plantar aspects.  Neurological: He is alert and oriented to person, place, and time.  Skin: Skin is warm and dry.  Nursing note and vitals reviewed.    ED Treatments / Results  Labs (all labs ordered are listed, but only abnormal results are displayed) Labs Reviewed - No data to display  EKG  EKG Interpretation None       Radiology Dg Ankle Complete Right  Result Date: 10/23/2015 CLINICAL DATA:  Medial right ankle pain after a fall tonight. EXAM: RIGHT ANKLE - COMPLETE 3+ VIEW COMPARISON:  None. FINDINGS: There is no evidence of fracture, dislocation, or joint effusion. There is no evidence of arthropathy or other focal bone abnormality. Soft tissues are unremarkable. IMPRESSION: Negative. Electronically Signed   By: Lucienne Capers M.D.   On: 10/23/2015 22:38    Procedures Procedures (including critical care time)  Medications Ordered in ED Medications  traMADol (ULTRAM) tablet 50 mg (50 mg Oral Given 10/23/15 2146)  carvedilol (COREG) tablet 6.25 mg (6.25 mg Oral Given 10/23/15 2146)  losartan (COZAAR) tablet 25 mg (25 mg Oral Given 10/23/15 2146)     Initial Impression / Assessment and Plan / ED Course  I have reviewed the triage vital signs and the nursing notes.  Pertinent labs & imaging results that were available during my care of the patient were reviewed by me and considered in my medical decision making (see chart for details).  Clinical Course   Douglas [name]. is a 30 y.o. male who presents to ED for right foot pain. On exam, RLE is NVI. TTP of medial ankle with no erythema, warmth or swelling. X-ray obtained and unremarkable. Short course pain meds given. Ortho follow up if no improvement by Monday.  Reasons to return to ER discussed including redness or warmth developing. Crutches and brace provided in ED and all questions answered.   BP elevated in ED today but patient has not been compliance with BP meds. Home meds given and BP trending down appropriately. PCP follow up encouraged.   Final Clinical Impressions(s) / ED Diagnoses   Final diagnoses:  Right foot pain    New Prescriptions New Prescriptions   TRAMADOL (ULTRAM) 50 MG TABLET    Take 1 tablet (50 mg total) by mouth every 6 (six) hours as needed.     Ozella Almond Ward, PA-C  10/23/15 [place], MD 10/24/15 2109

## 2015-10-23 NOTE — ED Triage Notes (Signed)
Pt awoke this am at 0400 to use the restroom and upon ambulation to bathroom felt intense pain to the right foot most pronounced in the medial area. EMS reported some mild edema and warmth to the touch but pedal pulses WNL. Pt reports that pain has progressed during the day however only experiences pain with activity. Pt able to apply light pressure to foot with ambulation.

## 2015-10-23 NOTE — Discharge Instructions (Signed)
Take pain medication only as needed for severe pain - This can make you very drowsy - please do not drink alcohol, operate heavy machinery or drive on this medication.  If pain is not improving by Monday, please call the orthopedic clinic to schedule a follow up appointment.  Return to ER for redness, new or worsening symptoms, any additional concerns.

## 2015-10-28 ENCOUNTER — Ambulatory Visit: Payer: Medicaid Other | Admitting: Adult Health

## 2015-11-03 ENCOUNTER — Encounter (HOSPITAL_COMMUNITY): Payer: Medicaid Other

## 2015-11-10 ENCOUNTER — Other Ambulatory Visit (HOSPITAL_COMMUNITY): Payer: Self-pay | Admitting: *Deleted

## 2015-11-10 MED ORDER — FUROSEMIDE 40 MG PO TABS: 40.0000 mg | ORAL_TABLET | Freq: Two times a day (BID) | ORAL | 3 refills | Status: DC

## 2015-11-11 ENCOUNTER — Ambulatory Visit (HOSPITAL_COMMUNITY)
Admission: RE | Admit: 2015-11-11 | Discharge: 2015-11-11 | Disposition: A | Discharge status: Home / Self Care | Payer: Medicaid Other | Source: Ambulatory Visit | Attending: Cardiology | Admitting: Cardiology

## 2015-11-11 ENCOUNTER — Other Ambulatory Visit (HOSPITAL_COMMUNITY): Payer: Self-pay | Admitting: *Deleted

## 2015-11-11 ENCOUNTER — Encounter (HOSPITAL_COMMUNITY): Payer: Self-pay

## 2015-11-11 VITALS — BP 134/82 | HR 87 | Wt 288.0 lb

## 2015-11-11 DIAGNOSIS — Z823 Family history of stroke: Secondary | ICD-10-CM | POA: Diagnosis not present

## 2015-11-11 DIAGNOSIS — I5032 Chronic diastolic (congestive) heart failure: Secondary | ICD-10-CM | POA: Diagnosis present

## 2015-11-11 DIAGNOSIS — I110 Hypertensive heart disease with heart failure: Secondary | ICD-10-CM | POA: Insufficient documentation

## 2015-11-11 DIAGNOSIS — Z8249 Family history of ischemic heart disease and other diseases of the circulatory system: Secondary | ICD-10-CM | POA: Insufficient documentation

## 2015-11-11 DIAGNOSIS — Z87891 Personal history of nicotine dependence: Secondary | ICD-10-CM | POA: Insufficient documentation

## 2015-11-11 DIAGNOSIS — Z809 Family history of malignant neoplasm, unspecified: Secondary | ICD-10-CM | POA: Diagnosis not present

## 2015-11-11 DIAGNOSIS — Z9119 Patient's noncompliance with other medical treatment and regimen: Secondary | ICD-10-CM | POA: Insufficient documentation

## 2015-11-11 DIAGNOSIS — M1990 Unspecified osteoarthritis, unspecified site: Secondary | ICD-10-CM | POA: Insufficient documentation

## 2015-11-11 DIAGNOSIS — I10 Essential (primary) hypertension: Secondary | ICD-10-CM

## 2015-11-11 DIAGNOSIS — G4733 Obstructive sleep apnea (adult) (pediatric): Secondary | ICD-10-CM | POA: Diagnosis not present

## 2015-11-11 DIAGNOSIS — E7800 Pure hypercholesterolemia, unspecified: Secondary | ICD-10-CM | POA: Insufficient documentation

## 2015-11-11 DIAGNOSIS — Z6841 Body Mass Index (BMI) 40.0 and over, adult: Secondary | ICD-10-CM | POA: Insufficient documentation

## 2015-11-11 LAB — BASIC METABOLIC PANEL
ANION GAP: 10 (ref 5–15)
BUN: 15 mg/dL (ref 6–20)
CHLORIDE: 96 mmol/L — AB (ref 101–111)
CO2: 30 mmol/L (ref 22–32)
Calcium: 9 mg/dL (ref 8.9–10.3)
Creatinine, Ser: 1.17 mg/dL (ref 0.61–1.24)
GFR calc Af Amer: >60 mL/min (ref >60)
GLUCOSE: 100 mg/dL — AB (ref 65–99)
POTASSIUM: 4.1 mmol/L (ref 3.5–5.1)
SODIUM: 136 mmol/L (ref 135–145)

## 2015-11-11 LAB — BRAIN NATRIURETIC PEPTIDE: B NATRIURETIC PEPTIDE 5: 39.9 pg/mL (ref 0.0–100.0)

## 2015-11-11 NOTE — Progress Notes (Signed)
Patient ID: Douglas Madura., male   DOB: [birth year], 30 y.o.   MRN: [id number]    Advanced Heart Failure Clinic Note   PCP: none.  Primary Cardiologist:Dr Aundra Dubin  Pulmonary  HPI: Douglas [name] is a 30 yo with OHS/OSA, morbid obesity, HTN, diastolic CHF with prominent RV failure.   Admitted in August 2016  to [facility] with volume overload. Diuresed with lasix drip and diamox. Transitioned to lasix 80 mg twice a day. Discharge weight 257 pounds.   Recent admit to [facility] 3/21 through 04/28/15. Admitted with respiratory distress. Required short term intubation. Apparently had not been wearing BiPap. Discharge weight was 259 pounds.   He returns today for regular follow up.  Weight up 10 lbs since visit in August. Doesn't weigh daily.  Drinking more than > 2 L and eating junk food. Denies SOB. Has started back walking.  Thinks he is mostly out of shape, denies overt DOE.  Denies CP, lightheadedness, or dizziness. No smoking. Drinks 1-2 beers on the weekend. Is out of work currently. Denies orthopnea or bendopnea.  Uses BiPAP most of the time.  Pt stays "I'm going to be honest with you" and states he only takes his evening medications 2-4 times a week depending on the week.    ECHO 03/20/2014 EF 60-65%  Labs 09/21/2014: K 3.9 Creatinine 1.03  Labs 11/26/2014: K 4.2 Creatinine 1.08  Labs 12/30/2014: K 4.7 Creatinine 1.05  Labs 04/26/2015: K 4.0 Creatinine 0.94   ROS: All systems negative except as listed in HPI, PMH and Problem List.  SH:  Social History   Social History  . Marital status: Single    Spouse name: N/A  . Number of children: 2  . Years of education: N/A   Occupational History  . N/A Not Employed   Social History Main Topics  . Smoking status: Former Smoker    Packs/day: 1.50    Years: 3.00    Types: Cigarettes    Quit date: 10/31/2013  . Smokeless tobacco: Never Used  . Alcohol use No  . Drug use: No     Comment: 10/31/2013 "might use cocaine once/month" - Has not used since  2015.   Marland Kitchen Sexual activity: No   Other Topics Concern  . Not on file   Social History Narrative  . No narrative on file    FH:  Family History  Problem Relation Age of Onset  . Heart disease Mother     Pacemaker  . Hypertension Father   . Cancer Paternal Grandfather   . Heart attack Neg Hx   . Stroke Neg Hx     Past Medical History:  Diagnosis Date  . Arthritis    "hands, feet" (10/31/2013)  . CHF (congestive heart failure) (Douglas [name])   . Chronic bronchitis ([place])   . High cholesterol   . Hypertension   . Obesity   . OSA on CPAP   . Pneumonia 09/2011  . Substance abuse 8/31/213   cociane "first time use"  . Tobacco abuse     Current Outpatient Prescriptions  Medication Sig Dispense Refill  . albuterol (PROVENTIL HFA;VENTOLIN HFA) 108 (90 BASE) MCG/ACT inhaler Inhale 1-2 puffs into the lungs every 6 (six) hours as needed for wheezing or shortness of breath. 1 Inhaler 0  . carvedilol (COREG) 6.25 MG tablet Take 1 tablet (6.25 mg total) by mouth 2 (two) times daily with a meal. 60 tablet 11  . furosemide (LASIX) 40 MG tablet Take 1 tablet (40 mg total)  by mouth 2 (two) times daily. 60 tablet 3  . losartan (COZAAR) 25 MG tablet Take 1 tablet (25 mg total) by mouth 2 (two) times daily. 60 tablet 3  . potassium chloride SA (K-DUR,KLOR-CON) 20 MEQ tablet Take 1 tablet (20 mEq total) by mouth daily. 30 tablet 0   No current facility-administered medications for this encounter.     Vitals:   11/11/15 0952  BP: 134/82  Pulse: 87  SpO2: 94%  Weight: 288 lb (130.6 kg)   Wt Readings from Last 3 Encounters:  11/11/15 288 lb (130.6 kg)  10/23/15 270 lb (122.5 kg)  09/01/15 278 lb 12.8 oz (126.5 kg)     PHYSICAL EXAM:  General:  Well appearing. NAD HEENT: Normal Neck: supple. JVP 8-9 cm. . Carotids 2+ bilaterally; no bruits. No thyromegaly or nodule noted.  Cor: PMI normal. RRR. No M/G/R.  Lungs: Clear, normal effort Abdomen: obese, soft, NT, ND, no HSM. No bruits or  masses. +BS  Extremities: no cyanosis, clubbing, rash. Trace ankle edema. Neuro: alert & orientedx3, cranial nerves grossly intact. Moves all 4 extremities w/o difficulty. Affect pleasant.   ASSESSMENT & PLAN: 30 yo with OHS/OSA, morbid obesity, diastolic CHF with prominent RV failure.   1.Chronic Diastolic CHF with prominent RV failure: EF normal on last echo 02/2014 with moderate RV dilation/moderate RV systolic dysfunction.NYHA II.   - Volume status mildly elevated in setting of non-compliance. Only takes evening doses of all meds 2-4 times a week.  - Continue lasix 40 mg BID and potassium 20 meq daily - Continue coreg 6.25 mg BID  - Continue Losartan 25 mg daily - Reinforced daily weights, low salt diet, and limiting fluid intake to < 2 liters per day.  - Last echo 02/2014. Will schedule at next visit. Want him back on medications prior to checking.  2. OHS/OSA:  - Needs to wear BiPAP nightly. Occasionally misses.  - Followed by pulmonary.     3. HTN - Continue losartan 25 mg BID 4. Morbid Obesity - needs to lose weight. Encouraged to watch portions and increase activity as able.   Pt states he has been poorly compliant with his medications. Stressed importance of taking his medications as directed, as well as limited fluid and salt. Discussed prognosis and age, as pt still has plenty of time to turn his health problems around. Pt at one point became tearful.   Satira Mccallum Tillery PA-C 10:23 AM   Total time spent 25 minutes. Over half that spent discussing the above.

## 2015-11-11 NOTE — Patient Instructions (Signed)
Labs today  Your physician recommends that you schedule a follow-up appointment in: 4 weeks   

## 2015-11-25 ENCOUNTER — Ambulatory Visit: Payer: Medicaid Other | Admitting: Adult Health

## 2015-12-11 ENCOUNTER — Ambulatory Visit (HOSPITAL_COMMUNITY)
Admission: RE | Admit: 2015-12-11 | Discharge: 2015-12-11 | Disposition: A | Discharge status: Home / Self Care | Payer: Medicaid Other | Source: Ambulatory Visit | Attending: Cardiology | Admitting: Cardiology

## 2015-12-11 ENCOUNTER — Ambulatory Visit: Payer: Medicaid Other | Admitting: Adult Health

## 2015-12-11 ENCOUNTER — Encounter (HOSPITAL_COMMUNITY): Payer: Self-pay

## 2015-12-11 ENCOUNTER — Other Ambulatory Visit (HOSPITAL_COMMUNITY): Payer: Self-pay

## 2015-12-11 VITALS — BP 160/109 | HR 88 | Wt 285.5 lb

## 2015-12-11 DIAGNOSIS — I5032 Chronic diastolic (congestive) heart failure: Secondary | ICD-10-CM

## 2015-12-11 DIAGNOSIS — I10 Essential (primary) hypertension: Secondary | ICD-10-CM | POA: Diagnosis not present

## 2015-12-11 DIAGNOSIS — Z79899 Other long term (current) drug therapy: Secondary | ICD-10-CM | POA: Diagnosis not present

## 2015-12-11 DIAGNOSIS — Z0001 Encounter for general adult medical examination with abnormal findings: Secondary | ICD-10-CM | POA: Diagnosis not present

## 2015-12-11 DIAGNOSIS — I110 Hypertensive heart disease with heart failure: Secondary | ICD-10-CM | POA: Insufficient documentation

## 2015-12-11 DIAGNOSIS — G4733 Obstructive sleep apnea (adult) (pediatric): Secondary | ICD-10-CM | POA: Insufficient documentation

## 2015-12-11 DIAGNOSIS — Z87891 Personal history of nicotine dependence: Secondary | ICD-10-CM | POA: Diagnosis not present

## 2015-12-11 MED ORDER — FUROSEMIDE 40 MG PO TABS: 40.0000 mg | ORAL_TABLET | Freq: Two times a day (BID) | ORAL | 3 refills | Status: DC

## 2015-12-11 MED ORDER — CARVEDILOL 6.25 MG PO TABS: 6.2500 mg | ORAL_TABLET | Freq: Two times a day (BID) | ORAL | 11 refills | Status: DC

## 2015-12-11 MED ORDER — LOSARTAN POTASSIUM 50 MG PO TABS: 50.0000 mg | ORAL_TABLET | Freq: Every day | ORAL | 3 refills | Status: DC

## 2015-12-11 MED ORDER — POTASSIUM CHLORIDE CRYS ER 20 MEQ PO TBCR: 20.0000 meq | EXTENDED_RELEASE_TABLET | Freq: Every day | ORAL | 3 refills | Status: DC

## 2015-12-11 MED ORDER — LOSARTAN POTASSIUM 50 MG PO TABS: 50.0000 mg | ORAL_TABLET | Freq: Two times a day (BID) | ORAL | 3 refills | Status: DC

## 2015-12-11 NOTE — Patient Instructions (Addendum)
Losartan dose has been changed to 50 mg (1 Tab) Once Daily  Echo has been ordered  Follow up with [name]   Follow up in 2 months

## 2015-12-12 NOTE — Progress Notes (Signed)
Patient ID: Douglas [name]., male   DOB: February 18, 1985, 30 y.o.   MRN: [id number]    Advanced Heart Failure Clinic Note   PCP: none.  Primary Cardiologist: Dr Aundra Dubin  Pulmonary  HPI: Douglas [name] is a 30 yo with OHS/OSA, morbid obesity, HTN, diastolic CHF with prominent RV failure.   Admitted in August 2016  to [facility] with volume overload. Diuresed with lasix drip and diamox. Transitioned to lasix 80 mg twice a day. Discharge weight 257 pounds.   Recent admit to [facility] 3/21 through 04/28/15. Admitted with respiratory distress. Required short term intubation. Apparently had not been wearing BiPap. Discharge weight was 259 pounds.   He returns for regular followup.  He has been out of his meds x 2 wks (only has Lasix).  BP is therefore high.  He is using his Bipap.  He is doing some walking for exercise. No exertional dyspnea.  No problems walking up steps.  Weight is down 3 lbs.  No lightheadedness or syncope. No palpitations.   ECHO 03/20/2014 EF 32%  Labs 09/21/2014: K 3.9 Creatinine 1.03  Labs 11/26/2014: K 4.2 Creatinine 1.08  Labs 12/30/2014: K 4.7 Creatinine 1.05  Labs 04/26/2015: K 4.0 Creatinine 0.94  Labs 10/17: K 4.1, creatinine 1.17  ROS: All systems negative except as listed in HPI, PMH and Problem List.  SH:  Social History   Social History  . Marital status: Single    Spouse name: N/A  . Number of children: 2  . Years of education: N/A   Occupational History  . N/A Not Employed   Social History Main Topics  . Smoking status: Former Smoker    Packs/day: 1.50    Years: 3.00    Types: Cigarettes    Quit date: 10/31/2013  . Smokeless tobacco: Never Used  . Alcohol use No  . Drug use: No     Comment: 10/31/2013 "might use cocaine once/month" - Has not used since 2015.   Marland Kitchen Sexual activity: No   Other Topics Concern  . Not on file   Social History Narrative  . No narrative on file    FH:  Family History  Problem Relation Age of Onset  . Heart disease Mother     Pacemaker  . Hypertension Father   . Cancer Paternal Grandfather   . Heart attack Neg Hx   . Stroke Neg Hx     Past Medical History:  Diagnosis Date  . Arthritis    "hands, feet" (10/31/2013)  . CHF (congestive heart failure) ([place])   . Chronic bronchitis ([place])   . High cholesterol   . Hypertension   . Obesity   . OSA on CPAP   . Pneumonia 09/2011  . Substance abuse 8/31/213   cociane "first time use"  . Tobacco abuse     Current Outpatient Prescriptions  Medication Sig Dispense Refill  . albuterol (PROVENTIL HFA;VENTOLIN HFA) 108 (90 BASE) MCG/ACT inhaler Inhale 1-2 puffs into the lungs every 6 (six) hours as needed for wheezing or shortness of breath. 1 Inhaler 0  . carvedilol (COREG) 6.25 MG tablet Take 1 tablet (6.25 mg total) by mouth 2 (two) times daily with a meal. 60 tablet 11  . furosemide (LASIX) 40 MG tablet Take 1 tablet (40 mg total) by mouth 2 (two) times daily. 60 tablet 3  . losartan (COZAAR) 50 MG tablet Take 1 tablet (50 mg total) by mouth daily. 30 tablet 3  . potassium chloride SA (K-DUR,KLOR-CON) 20 MEQ tablet Take  1 tablet (20 mEq total) by mouth daily. 30 tablet 3   No current facility-administered medications for this encounter.     Vitals:   12/11/15 1110  BP: (!) 160/109  Pulse: 88  SpO2: 92%  Weight: 285 lb 8 oz (129.5 kg)   Wt Readings from Last 3 Encounters:  12/11/15 285 lb 8 oz (129.5 kg)  11/11/15 288 lb (130.6 kg)  10/23/15 270 lb (122.5 kg)     PHYSICAL EXAM:  General:  Well appearing. No resp difficulty. Ambulated in clinic without difficulty.  HEENT: normal Neck: supple. JVP 6-7 . Carotids 2+ bilaterally; no bruits. No thyromegaly or nodule noted.  Cor: PMI normal. RRR. No M/G/R.  Lungs: CTAB, normal effort Abdomen: obese, soft, NT, ND, no HSM. No bruits or masses. +BS  Extremities: no cyanosis, clubbing, rash, edema Neuro: alert & orientedx3, cranial nerves grossly intact. Moves all 4 extremities w/o difficulty. Affect  pleasant.   ASSESSMENT & PLAN: 30 yo with OHS/OSA, morbid obesity, diastolic CHF with prominent RV failure.  1.Chronic diastolic CHF with prominent RV failure: EF normal on last echo 02/2014 with moderate RV dilation/moderate RV systolic dysfunction.NYHA II.  Volume status stable.  - Continue lasix 40 twice a day.   - I will arrange for a repeat echo.  2. OHS/OSA:  Continue Bipap nightly. - Followed by pulmonary.     3. HTN:  Restart Coreg and losartan today.  I will have him come back when taking all his meds in 2 wks for appt with the pharmacist for BP check and medication titration.  4. Morbid Obesity:  Working on weight loss.   Pharmacy appt in 2 wks, see me in 2 months.   [name] 12/12/2015

## 2015-12-23 ENCOUNTER — Ambulatory Visit (HOSPITAL_COMMUNITY): Payer: Medicaid Other

## 2015-12-30 ENCOUNTER — Ambulatory Visit (HOSPITAL_COMMUNITY): Payer: Medicaid Other

## 2016-01-07 ENCOUNTER — Ambulatory Visit (HOSPITAL_COMMUNITY): Payer: Medicaid Other

## 2016-01-14 ENCOUNTER — Encounter (HOSPITAL_COMMUNITY): Payer: Self-pay

## 2016-01-14 ENCOUNTER — Emergency Department (HOSPITAL_COMMUNITY)
Admission: EM | Admit: 2016-01-14 | Discharge: 2016-01-14 | Disposition: A | Discharge status: Home / Self Care | Payer: Medicaid Other | Attending: Emergency Medicine | Admitting: Emergency Medicine

## 2016-01-14 ENCOUNTER — Emergency Department (HOSPITAL_COMMUNITY): Payer: Medicaid Other

## 2016-01-14 DIAGNOSIS — R531 Weakness: Secondary | ICD-10-CM

## 2016-01-14 DIAGNOSIS — I5032 Chronic diastolic (congestive) heart failure: Secondary | ICD-10-CM | POA: Diagnosis not present

## 2016-01-14 DIAGNOSIS — I110 Hypertensive heart disease with heart failure: Secondary | ICD-10-CM | POA: Insufficient documentation

## 2016-01-14 DIAGNOSIS — Z87891 Personal history of nicotine dependence: Secondary | ICD-10-CM | POA: Diagnosis not present

## 2016-01-14 DIAGNOSIS — Z79899 Other long term (current) drug therapy: Secondary | ICD-10-CM | POA: Insufficient documentation

## 2016-01-14 DIAGNOSIS — R0602 Shortness of breath: Secondary | ICD-10-CM | POA: Diagnosis present

## 2016-01-14 LAB — URINALYSIS, ROUTINE W REFLEX MICROSCOPIC
Bilirubin Urine: NEGATIVE
GLUCOSE, UA: NEGATIVE mg/dL
Ketones, ur: NEGATIVE mg/dL
LEUKOCYTES UA: NEGATIVE
Nitrite: NEGATIVE
PH: 5 (ref 5.0–8.0)
Protein, ur: >=300 mg/dL — AB
Specific Gravity, Urine: 1.011 (ref 1.005–1.030)

## 2016-01-14 LAB — BASIC METABOLIC PANEL
ANION GAP: 9 (ref 5–15)
BUN: 13 mg/dL (ref 6–20)
CHLORIDE: 92 mmol/L — AB (ref 101–111)
CO2: 38 mmol/L — AB (ref 22–32)
Calcium: 8.2 mg/dL — ABNORMAL LOW (ref 8.9–10.3)
Creatinine, Ser: 1.18 mg/dL (ref 0.61–1.24)
GFR calc non Af Amer: >60 mL/min (ref >60)
Glucose, Bld: 84 mg/dL (ref 65–99)
POTASSIUM: 4.2 mmol/L (ref 3.5–5.1)
Sodium: 139 mmol/L (ref 135–145)

## 2016-01-14 LAB — CBC
HCT: 56.6 % — ABNORMAL HIGH (ref 39.0–52.0)
Hemoglobin: 18.2 g/dL — ABNORMAL HIGH (ref 13.0–17.0)
MCH: 29.4 pg (ref 26.0–34.0)
MCHC: 32.2 g/dL (ref 30.0–36.0)
MCV: 91.6 fL (ref 78.0–100.0)
PLATELETS: 228 10*3/uL (ref 150–400)
RBC: 6.18 MIL/uL — ABNORMAL HIGH (ref 4.22–5.81)
RDW: 17.1 % — AB (ref 11.5–15.5)
WBC: 8.8 10*3/uL (ref 4.0–10.5)

## 2016-01-14 LAB — CBG MONITORING, ED: Glucose-Capillary: 90 mg/dL (ref 65–99)

## 2016-01-14 LAB — TROPONIN I: Troponin I: <0.03 ng/mL (ref <0.03)

## 2016-01-14 LAB — BRAIN NATRIURETIC PEPTIDE: B NATRIURETIC PEPTIDE 5: 317.8 pg/mL — AB (ref 0.0–100.0)

## 2016-01-14 MED ORDER — ALBUTEROL SULFATE HFA 108 (90 BASE) MCG/ACT IN AERS
2.0000 | INHALATION_SPRAY | RESPIRATORY_TRACT | Status: DC
  Administered 2016-01-14: 2 via RESPIRATORY_TRACT
  Filled 2016-01-14: qty 6.7

## 2016-01-14 NOTE — ED Notes (Signed)
Patient d/c'd self care.  F/U and medications reviewed.  Patient verbalized understanding. 

## 2016-01-14 NOTE — ED Notes (Signed)
Spoke with MD prior to D/C about BNP results he advised patient OK to D/C

## 2016-01-14 NOTE — Discharge Instructions (Signed)
Please use albuterol inhaler 2 puffs every 4 hours as needed. Please use oxygen 24/7. Please schedule appointment tomorrow with your primary care physician for follow-up.   SEEK IMMEDIATE MEDICAL CARE IF:  You cannot perform your normal daily activities, such as getting dressed and feeding yourself. You cannot walk up and down stairs, or you feel exhausted when you do so. You have shortness of breath or chest pain. You have difficulty moving parts of your body. You have weakness in only one area of the body or on only one side of the body. You have a fever. You have trouble speaking or swallowing. You cannot control your bladder or bowel movements. You have black or bloody vomit or stools.

## 2016-01-14 NOTE — Progress Notes (Signed)
Patient listed as having Medicaid insurance without a pcp.  Pcp listed on patient's insurance card is located at the [facility].  System updated.

## 2016-01-14 NOTE — ED Notes (Signed)
Writer spoke with lab, BNP in being processed.

## 2016-01-14 NOTE — ED Notes (Signed)
Patient given sandwich and ok to take BP med per provider.

## 2016-01-14 NOTE — ED Notes (Signed)
Patient advises that he wears O2 at home at night only.

## 2016-01-14 NOTE — ED Triage Notes (Signed)
Per EMS- Patient c/o generalized weakness and feeling weak x 2 weeks. Patient also feels like he is retaining fluid in his abdomen. Patient had a CBG-82. EMS gave 15 grams oral glucose prior to arrival to the ED.

## 2016-01-14 NOTE — ED Provider Notes (Signed)
[facility] DEPT Provider Note   CSN: [id number] Arrival date & time: 01/14/16  1608     History   Chief Complaint No chief complaint on file.   HPI Douglas [name]. is a 30 y.o. male with past medical history of CHF, chronic bronchitis, hypertension, and obesity presents with shortness of breath 3 days and feeling "cold-like" symptoms and generalized weakness 2 weeks. Patient reports worsening shortness of breath these past 3 days and states that it may be associated with his "cold-like symptoms." He reports his "cold-like symptoms" include congestion, cough, and rhinorrhea and that his symptoms are improving. He states that his cough is productive and a clear color. Patient states that he is seen at the heart failure clinic every month ago. Patient states that he is on oxygen only at night and used to be on oxygen 24/7. Patient denies any chest pain, fever, chills, vomiting, urinary symptoms, and changes in bowel movements.   HPI  Past Medical History:  Diagnosis Date  . Arthritis    "hands, feet" (10/31/2013)  . CHF (congestive heart failure) ([place])   . Chronic bronchitis ([place])   . High cholesterol   . Hypertension   . Obesity   . OSA on CPAP   . Pneumonia 09/2011  . Substance abuse 8/31/213   cociane "first time use"  . Tobacco abuse     Patient Active Problem List   Diagnosis Date Noted  . Morbid obesity ([place]) 06/17/2015  . Essential hypertension 04/14/2015  . Hyperkalemia 04/14/2015  . OSA (obstructive sleep apnea)   . Difficult intravenous access   . Chronic diastolic CHF (congestive heart failure), NYHA class 3 ([place]) 09/13/2014  . Cor pulmonale ([place]) 03/19/2014  . OSA treated with BiPAP     Past Surgical History:  Procedure Laterality Date  . NO PAST SURGERIES    . TUBES IN EARS         Home Medications    Prior to Admission medications   Medication Sig Start Date End Date Taking? Authorizing Provider  albuterol (PROVENTIL HFA;VENTOLIN HFA)  108 (90 BASE) MCG/ACT inhaler Inhale 1-2 puffs into the lungs every 6 (six) hours as needed for wheezing or shortness of breath. 11/05/13  Yes Shanker Kristeen Mans, MD  carvedilol (COREG) 6.25 MG tablet Take 1 tablet (6.25 mg total) by mouth 2 (two) times daily with a meal. 12/11/15  Yes Larey Dresser, MD  furosemide (LASIX) 40 MG tablet Take 40 mg by mouth 2 (two) times daily.   Yes Historical Provider, MD  losartan (COZAAR) 50 MG tablet Take 1 tablet (50 mg total) by mouth daily. 12/11/15  Yes Larey Dresser, MD  potassium chloride SA (K-DUR,KLOR-CON) 20 MEQ tablet Take 1 tablet (20 mEq total) by mouth daily. 12/11/15  Yes Larey Dresser, MD  Pseudoeph-Doxylamine-DM-APAP (NYQUIL PO) Take 1 capsule by mouth at bedtime as needed (cold symptoms).   Yes Historical Provider, MD  furosemide (LASIX) 40 MG tablet Take 1 tablet (40 mg total) by mouth 2 (two) times daily. 12/11/15 01/10/16  Larey Dresser, MD    Family History Family History  Problem Relation Age of Onset  . Heart disease Mother     Pacemaker  . Hypertension Father   . Cancer Paternal Grandfather   . Heart attack Neg Hx   . Stroke Neg Hx     Social History Social History  Substance Use Topics  . Smoking status: Former Smoker    Packs/day: 1.50    Years:  3.00    Types: Cigarettes    Quit date: 10/31/2013  . Smokeless tobacco: Never Used  . Alcohol use No     Allergies   Lisinopril   Review of Systems Review of Systems  Constitutional: Negative for chills and fever.  HENT: Positive for congestion (improving). Negative for sore throat and trouble swallowing.   Eyes: Negative for pain and visual disturbance.  Respiratory: Positive for cough (Improving), shortness of breath and wheezing.   Cardiovascular: Negative for chest pain and palpitations.  Gastrointestinal: Negative for abdominal pain, constipation, diarrhea and vomiting.  Genitourinary: Negative for dysuria and hematuria.  Musculoskeletal: Negative for  arthralgias, back pain, neck pain and neck stiffness.  Skin: Negative for color change and rash.  Neurological: Negative for numbness.  All other systems reviewed and are negative.    Physical Exam Updated Vital Signs BP 132/91 (BP Location: Left Arm)   Pulse 72   Temp 99.3 F (37.4 C) (Oral)   Resp 18   Ht 5\' 4"  (1.626 m)   Wt 122.5 kg   SpO2 98%   BMI 46.35 kg/m   Physical Exam  Constitutional: He is oriented to person, place, and time. He appears well-developed and well-nourished.  HENT:  Head: Normocephalic and atraumatic.  Nose: Nose normal.  Eyes: Conjunctivae and EOM are normal. Pupils are equal, round, and reactive to light.  Neck: Normal range of motion. Neck supple.  Cardiovascular: Normal rate and normal heart sounds.   Pulmonary/Chest: Effort normal. No respiratory distress. He has wheezes. He exhibits no tenderness.  Abdominal: Soft. Bowel sounds are normal. There is no tenderness. There is no rebound and no guarding.  Musculoskeletal: Normal range of motion.  Neurological: He is alert and oriented to person, place, and time.  Skin: Skin is warm. Capillary refill takes less than 2 seconds.  Psychiatric: He has a normal mood and affect. His behavior is normal.  Nursing note and vitals reviewed.    ED Treatments / Results  Labs (all labs ordered are listed, but only abnormal results are displayed) Labs Reviewed  CBC - Abnormal; Notable for the following:       Result Value   RBC 6.18 (*)    Hemoglobin 18.2 (*)    HCT 56.6 (*)    RDW 17.1 (*)    All other components within normal limits  URINALYSIS, ROUTINE W REFLEX MICROSCOPIC - Abnormal; Notable for the following:    Hgb urine dipstick SMALL (*)    Protein, ur >=300 (*)    Bacteria, UA RARE (*)    Squamous Epithelial / LPF 0-5 (*)    All other components within normal limits  BASIC METABOLIC PANEL - Abnormal; Notable for the following:    Chloride 92 (*)    CO2 38 (*)    Calcium 8.2 (*)    All  other components within normal limits  BRAIN NATRIURETIC PEPTIDE - Abnormal; Notable for the following:    B Natriuretic Peptide 317.8 (*)    All other components within normal limits  TROPONIN I  CBG MONITORING, ED    EKG  EKG Interpretation  Date/Time:  Thursday January 14 2016 16:24:38 EST Ventricular Rate:  74 PR Interval:    QRS Duration: 94 QT Interval:  414 QTC Calculation: 460 R Axis:   -178 Text Interpretation:  Sinus rhythm Probable left atrial enlargement Probable right ventricular hypertrophy Borderline T abnormalities, anterior leads no significant change since Mar 2017 Confirmed by Regenia Skeeter MD, [place] [phone]) on 01/14/2016 5:15:41  PM       Radiology Dg Chest 2 View  Result Date: 01/14/2016 CLINICAL DATA:  Shortness of breath and productive cough for 2-3 days. EXAM: CHEST  2 VIEW COMPARISON:  04/19/2015 FINDINGS: The lungs are clear wiithout focal pneumonia, edema, pneumothorax or pleural effusion. The cardio pericardial silhouette is enlarged. The visualized bony structures of the thorax are intact. Telemetry leads overlie the chest. IMPRESSION: Enlargement of the cardiopericardial silhouette without acute cardiopulmonary findings. Electronically Signed   By: Misty Stanley M.D.   On: 01/14/2016 18:36    Procedures Procedures (including critical care time)  Medications Ordered in ED Medications  albuterol (PROVENTIL HFA;VENTOLIN HFA) 108 (90 Base) MCG/ACT inhaler 2 puff (2 puffs Inhalation Given 01/14/16 2107)     Initial Impression / Assessment and Plan / ED Course  I have reviewed the triage vital signs and the nursing notes.  Pertinent labs & imaging results that were available during my care of the patient were reviewed by me and considered in my medical decision making (see chart for details).  Clinical Course   Patient is a 30 year old male presenting with generalized weakness for 2 weeks. Patient presented by EMS at 80% oxygen saturation and was put on  4 L of oxygen. After being put on 4 L oxygen patient felt much better. On exam in no apparent distress, hemodynamically stable, and temperature 99.3. Wheezing heard on lung auscultation. Heart sounds are clear. Abdomen soft/benign. Lab work shows no acute findings and consistent with previous labwork. BNP is slightly elevated or set elevation in his BNP before and is currently being followed by heart failure clinic. Chest x-ray shows no acute cardiopulmonary findings. No signs of pneumonia, edema, or pleural effusion. EKG also shows no acute findings. Upon reevaluation patient feels better, afebrile, and hemodynamically stable. Patient will be discharged with albuterol inhaler and a encouraged to use oxygen 24/7. Patient encouraged to his primary care physician and heart failure clinic tomorrow for further evaluation. Patient agrees with assessment and plan. Return precautions given for any new or worsening symptoms.   Final Clinical Impressions(s) / ED Diagnoses   Final diagnoses:  Weakness    New Prescriptions Discharge Medication List as of 01/14/2016  8:49 PM       [place], [place] 01/14/16 2130    Sherwood Gambler, MD 01/16/16 2355

## 2016-01-28 ENCOUNTER — Other Ambulatory Visit (HOSPITAL_COMMUNITY): Payer: Medicaid Other

## 2016-02-01 ENCOUNTER — Telehealth: Payer: Self-pay | Admitting: Pulmonary Disease

## 2016-02-01 DIAGNOSIS — G4733 Obstructive sleep apnea (adult) (pediatric): Secondary | ICD-10-CM

## 2016-02-01 NOTE — Telephone Encounter (Signed)
Pt requesting new bipap supplies- needs new mask and tubing.   Pt uses [facility]. VS ok to order supplies?  Thanks!

## 2016-02-01 NOTE — Telephone Encounter (Signed)
Okay to send order. 

## 2016-02-02 NOTE — Telephone Encounter (Signed)
Patient returning phone call 

## 2016-02-02 NOTE — Telephone Encounter (Signed)
Pt aware that supplies has been reordered. Pt voiced his understanding and had no further questions. Nothing further needed.

## 2016-02-02 NOTE — Telephone Encounter (Signed)
Order placed to [facility] to renew supplies. LM for pt.

## 2016-02-10 ENCOUNTER — Encounter (HOSPITAL_COMMUNITY): Payer: Medicaid Other

## 2016-02-14 ENCOUNTER — Emergency Department (HOSPITAL_BASED_OUTPATIENT_CLINIC_OR_DEPARTMENT_OTHER)
Admit: 2016-02-14 | Discharge: 2016-02-14 | Disposition: A | Discharge status: Home / Self Care | Payer: Medicaid Other | Attending: Emergency Medicine | Admitting: Emergency Medicine

## 2016-02-14 ENCOUNTER — Emergency Department (HOSPITAL_COMMUNITY): Payer: Medicaid Other

## 2016-02-14 ENCOUNTER — Emergency Department (HOSPITAL_COMMUNITY)
Admission: EM | Admit: 2016-02-14 | Discharge: 2016-02-14 | Disposition: A | Discharge status: Home / Self Care | Payer: Medicaid Other | Attending: Emergency Medicine | Admitting: Emergency Medicine

## 2016-02-14 ENCOUNTER — Encounter (HOSPITAL_COMMUNITY): Payer: Self-pay | Admitting: Emergency Medicine

## 2016-02-14 DIAGNOSIS — M7989 Other specified soft tissue disorders: Secondary | ICD-10-CM | POA: Diagnosis not present

## 2016-02-14 DIAGNOSIS — Z87891 Personal history of nicotine dependence: Secondary | ICD-10-CM | POA: Insufficient documentation

## 2016-02-14 DIAGNOSIS — I110 Hypertensive heart disease with heart failure: Secondary | ICD-10-CM | POA: Insufficient documentation

## 2016-02-14 DIAGNOSIS — Z79899 Other long term (current) drug therapy: Secondary | ICD-10-CM | POA: Diagnosis not present

## 2016-02-14 DIAGNOSIS — M109 Gout, unspecified: Secondary | ICD-10-CM | POA: Insufficient documentation

## 2016-02-14 DIAGNOSIS — I5032 Chronic diastolic (congestive) heart failure: Secondary | ICD-10-CM | POA: Insufficient documentation

## 2016-02-14 DIAGNOSIS — M79674 Pain in right toe(s): Secondary | ICD-10-CM | POA: Diagnosis present

## 2016-02-14 DIAGNOSIS — M79609 Pain in unspecified limb: Secondary | ICD-10-CM | POA: Diagnosis not present

## 2016-02-14 LAB — BASIC METABOLIC PANEL
ANION GAP: 8 (ref 5–15)
BUN: 9 mg/dL (ref 6–20)
CALCIUM: 8.9 mg/dL (ref 8.9–10.3)
CO2: 42 mmol/L — AB (ref 22–32)
Chloride: 88 mmol/L — ABNORMAL LOW (ref 101–111)
Creatinine, Ser: 0.9 mg/dL (ref 0.61–1.24)
GFR calc non Af Amer: >60 mL/min (ref >60)
Glucose, Bld: 85 mg/dL (ref 65–99)
POTASSIUM: 4 mmol/L (ref 3.5–5.1)
Sodium: 138 mmol/L (ref 135–145)

## 2016-02-14 LAB — CBC
HEMATOCRIT: 60.6 % — AB (ref 39.0–52.0)
HEMOGLOBIN: 18.1 g/dL — AB (ref 13.0–17.0)
MCH: 29.1 pg (ref 26.0–34.0)
MCHC: 29.9 g/dL — ABNORMAL LOW (ref 30.0–36.0)
MCV: 97.3 fL (ref 78.0–100.0)
Platelets: 239 10*3/uL (ref 150–400)
RBC: 6.23 MIL/uL — AB (ref 4.22–5.81)
RDW: 14.7 % (ref 11.5–15.5)
WBC: 10.7 10*3/uL — AB (ref 4.0–10.5)

## 2016-02-14 LAB — URIC ACID: Uric Acid, Serum: 9 mg/dL — ABNORMAL HIGH (ref 4.4–7.6)

## 2016-02-14 MED ORDER — COLCHICINE 0.6 MG PO TABS: 0.6000 mg | ORAL_TABLET | Freq: Every day | ORAL | 1 refills | Status: DC

## 2016-02-14 MED ORDER — MORPHINE SULFATE (PF) 4 MG/ML IV SOLN
4.0000 mg | Freq: Once | INTRAVENOUS | Status: DC
  Filled 2016-02-14 (×2): qty 1

## 2016-02-14 MED ORDER — IBUPROFEN 200 MG PO TABS
400.0000 mg | ORAL_TABLET | Freq: Once | ORAL | Status: AC
  Administered 2016-02-14: 400 mg via ORAL
  Filled 2016-02-14: qty 2

## 2016-02-14 MED ORDER — IBUPROFEN 400 MG PO TABS: 400.0000 mg | ORAL_TABLET | Freq: Four times a day (QID) | ORAL | 0 refills | Status: DC | PRN

## 2016-02-14 NOTE — ED Notes (Signed)
PT DISCHARGED. INSTRUCTIONS AND PRESCRIPTIONS GIVEN. AAOX4. PT IN NO APPARENT DISTRESS. THE OPPORTUNITY TO ASK QUESTIONS WAS PROVIDED. 

## 2016-02-14 NOTE — ED Triage Notes (Signed)
Pt from home with complaints of right foot pain that began on Tuesday morning. Pt states he has a sharp pain in his toes and ankle. Pt states that the pain has increased today. Pt has hx of CHF and had on o2 sat of 82% on RA. Pt states he used oxygen intermittently at home, and was placed on 3L. Pt states he is on 3L. Pt rates his pain 5/10 while he is not putting weight on his foot. Pt states he has not taken any pain medicine today.

## 2016-02-14 NOTE — ED Provider Notes (Signed)
[facility] DEPT Provider Note   CSN: [id number] Arrival date & time: 02/14/16  1404     History   Chief Complaint Chief Complaint  Patient presents with  . Foot Pain    HPI Douglas [name]. is a 31 y.o. male.  HPI Patient presents to the emergency room with complaints of right great toe pain. Patient states his symptoms started earlier in the week. Has been painful to stand and walk. He has been hopping around in order to avoid putting any weight on his foot. This morning the symptoms were more severe so he finally decided to come and get evaluated. Patient denies any recent falls or injuries. No fevers or chills. Pain is primarily around the right big toe (MTP joint)  states he had a similar episode a few months ago. He was checked out and was told everything looked okay. The symptoms resolved on their own. He was never diagnosed with gout. He denies any history of DVT or PE.  The nurse noted the patient was hypoxic when he first arrived here. Patient states he has history of obstructive sleep apnea and congestive heart failure. He does have oxygen at home but generally just wears at night. He denies any trouble chest pain or shortness of breath. Past Medical History:  Diagnosis Date  . Arthritis    "hands, feet" (10/31/2013)  . CHF (congestive heart failure) ([place])   . Chronic bronchitis ([place])   . High cholesterol   . Hypertension   . Obesity   . OSA on CPAP   . Pneumonia 09/2011  . Substance abuse 8/31/213   cociane "first time use"  . Tobacco abuse     Patient Active Problem List   Diagnosis Date Noted  . Morbid obesity ([place]) 06/17/2015  . Essential hypertension 04/14/2015  . Hyperkalemia 04/14/2015  . OSA (obstructive sleep apnea)   . Difficult intravenous access   . Chronic diastolic CHF (congestive heart failure), NYHA class 3 ([place]) 09/13/2014  . Cor pulmonale ([place]) 03/19/2014  . OSA treated with BiPAP     Past Surgical History:  Procedure Laterality  Date  . NO PAST SURGERIES    . TUBES IN EARS         Home Medications    Prior to Admission medications   Medication Sig Start Date End Date Taking? Authorizing Provider  albuterol (PROVENTIL HFA;VENTOLIN HFA) 108 (90 BASE) MCG/ACT inhaler Inhale 1-2 puffs into the lungs every 6 (six) hours as needed for wheezing or shortness of breath. 11/05/13  Yes Shanker Kristeen Mans, MD  carvedilol (COREG) 6.25 MG tablet Take 1 tablet (6.25 mg total) by mouth 2 (two) times daily with a meal. 12/11/15  Yes Larey Dresser, MD  furosemide (LASIX) 40 MG tablet Take 1 tablet (40 mg total) by mouth 2 (two) times daily. 12/11/15 02/13/17 Yes Larey Dresser, MD  losartan (COZAAR) 50 MG tablet Take 1 tablet (50 mg total) by mouth daily. 12/11/15  Yes Larey Dresser, MD  potassium chloride SA (K-DUR,KLOR-CON) 20 MEQ tablet Take 1 tablet (20 mEq total) by mouth daily. 12/11/15  Yes Larey Dresser, MD  colchicine 0.6 MG tablet Take 1 tablet (0.6 mg total) by mouth daily. 02/14/16   Dorie Rank, MD  ibuprofen (ADVIL,MOTRIN) 400 MG tablet Take 1 tablet (400 mg total) by mouth every 6 (six) hours as needed. 02/14/16   Dorie Rank, MD    Family History Family History  Problem Relation Age of Onset  . Heart  disease Mother     Pacemaker  . Hypertension Father   . Cancer Paternal Grandfather   . Heart attack Neg Hx   . Stroke Neg Hx     Social History Social History  Substance Use Topics  . Smoking status: Former Smoker    Packs/day: 1.50    Years: 3.00    Types: Cigarettes    Quit date: 10/31/2013  . Smokeless tobacco: Never Used  . Alcohol use No     Allergies   Lisinopril   Review of Systems Review of Systems  All other systems reviewed and are negative.    Physical Exam Updated Vital Signs BP (!) 145/112 (BP Location: Left Arm)   Pulse 84   Temp 98.2 F (36.8 C) (Oral)   Resp 18   Ht 5\' 4"  (1.626 m)   Wt 122.5 kg   SpO2 95%   BMI 46.35 kg/m   Physical Exam  Constitutional: He  appears well-developed and well-nourished. No distress.  Overweight  HENT:  Head: Normocephalic and atraumatic.  Right Ear: External ear normal.  Left Ear: External ear normal.  Eyes: Conjunctivae are normal. Right eye exhibits no discharge. Left eye exhibits no discharge. No scleral icterus.  Neck: Neck supple. No tracheal deviation present.  Cardiovascular: Normal rate, regular rhythm and intact distal pulses.   Pulmonary/Chest: Effort normal and breath sounds normal. No stridor. No respiratory distress. He has no wheezes. He has no rales.  Abdominal: Soft. Bowel sounds are normal. He exhibits no distension. There is no tenderness. There is no rebound and no guarding.  Musculoskeletal: He exhibits no edema or tenderness.  Tenderness, tenderness to palpation with erythema at the right big toe MTP joint  Neurological: He is alert. He has normal strength. No sensory deficit. Cranial nerve deficit: no gross deficits. He exhibits normal muscle tone. He displays no seizure activity. Coordination normal.  Skin: Skin is warm and dry. No rash noted.  Psychiatric: He has a normal mood and affect.  Nursing note and vitals reviewed.    ED Treatments / Results  Labs (all labs ordered are listed, but only abnormal results are displayed) Labs Reviewed  CBC - Abnormal; Notable for the following:       Result Value   WBC 10.7 (*)    RBC 6.23 (*)    Hemoglobin 18.1 (*)    HCT 60.6 (*)    MCHC 29.9 (*)    All other components within normal limits  BASIC METABOLIC PANEL - Abnormal; Notable for the following:    Chloride 88 (*)    CO2 42 (*)    All other components within normal limits  URIC ACID - Abnormal; Notable for the following:    Uric Acid, Serum 9.0 (*)    All other components within normal limits    Radiology Dg Chest 2 View  Result Date: 02/14/2016 CLINICAL DATA:  Foot pain and swelling starting 4 days ago EXAM: CHEST  2 VIEW COMPARISON:  01/14/2016 FINDINGS: Cardiomegaly again  noted. No infiltrate or pulmonary edema. Minimal degenerative changes lower thoracic spine. IMPRESSION: No active cardiopulmonary disease. Electronically Signed   By: Lahoma Crocker M.D.   On: 02/14/2016 15:57   Dg Foot Complete Right  Result Date: 02/14/2016 CLINICAL DATA:  Diffuse right foot pain and swelling starting Wednesday EXAM: RIGHT FOOT COMPLETE - 3+ VIEW COMPARISON:  None. FINDINGS: Three views of the right foot submitted. No acute fracture or subluxation. No radiopaque foreign body. There is soft tissue swelling  dorsal tarsal and metacarpal region. IMPRESSION: No acute fracture or subluxation. Soft tissue swelling tarsal and metatarsal region. Electronically Signed   By: Lahoma Crocker M.D.   On: 02/14/2016 16:04    Procedures Procedures (including critical care time)  Medications Ordered in ED Medications  morphine 4 MG/ML injection 4 mg (not administered)  ibuprofen (ADVIL,MOTRIN) tablet 400 mg (400 mg Oral Given 02/14/16 1608)     Initial Impression / Assessment and Plan / ED Course  I have reviewed the triage vital signs and the nursing notes.  Pertinent labs & imaging results that were available during my care of the patient were reviewed by me and considered in my medical decision making (see chart for details).  Clinical Course as of Feb 13 1817  Nancy Fetter Feb 14, 2016  1555 Pt requested motrin instead of morphine right now.  Previous creatinine was WNL.  [JK]    Clinical Course User Index [JK] Dorie Rank, MD    Patient presented around with complaints of foot and leg pain. In the ED was hypoxic. I was concerned about the possibility of a DVT. Doppler ultrasound was performed and it was negative. Chest x-ray did not show any evidence of pulmonary edema. Patient does have supplemental oxygen use at home and when he was placed on the emergency room is oxygen saturations normalized. I suspect the hypoxemia is chronic associated with his chronic medical conditions but there is no  evidence to pulmonary edema and I think it's unlikely he's had a PE with a negative DVT study. He also is not complaining of any chest pain or shortness of breath.  Patient's uric acid levels elevated. I suspect an acute gout attack is the source of his foot pain. Plan on discharge home with NSAIDs and colchicine. Follow-up with his primary doctor.  Final Clinical Impressions(s) / ED Diagnoses   Final diagnoses:  Acute gout involving toe of right foot, unspecified cause    New Prescriptions New Prescriptions   COLCHICINE 0.6 MG TABLET    Take 1 tablet (0.6 mg total) by mouth daily.   IBUPROFEN (ADVIL,MOTRIN) 400 MG TABLET    Take 1 tablet (400 mg total) by mouth every 6 (six) hours as needed.     Dorie Rank, MD 02/14/16 [phone]

## 2016-02-14 NOTE — Progress Notes (Signed)
VASCULAR LAB PRELIMINARY  PRELIMINARY  PRELIMINARY  PRELIMINARY  Right lower extremity venous duplex completed.    Preliminary report:  Right:  No evidence of DVT, superficial thrombosis, or Baker's cyst.  Kori Goins, RVS 02/14/2016, 5:58 PM

## 2016-02-14 NOTE — Discharge Instructions (Signed)
Continue your current medications. Make sure to use her oxygen. Take the medications to help with the gout inflammation of the foot

## 2016-02-24 ENCOUNTER — Ambulatory Visit (HOSPITAL_COMMUNITY)
Admission: RE | Admit: 2016-02-24 | Discharge: 2016-02-24 | Disposition: A | Discharge status: Home / Self Care | Payer: Medicaid Other | Source: Ambulatory Visit | Attending: Internal Medicine | Admitting: Internal Medicine

## 2016-02-24 VITALS — BP 168/112 | HR 87 | Wt 286.6 lb

## 2016-02-24 DIAGNOSIS — G4733 Obstructive sleep apnea (adult) (pediatric): Secondary | ICD-10-CM

## 2016-02-24 DIAGNOSIS — Z87891 Personal history of nicotine dependence: Secondary | ICD-10-CM | POA: Diagnosis not present

## 2016-02-24 DIAGNOSIS — I5032 Chronic diastolic (congestive) heart failure: Secondary | ICD-10-CM | POA: Diagnosis not present

## 2016-02-24 DIAGNOSIS — Z5189 Encounter for other specified aftercare: Secondary | ICD-10-CM | POA: Insufficient documentation

## 2016-02-24 DIAGNOSIS — I110 Hypertensive heart disease with heart failure: Secondary | ICD-10-CM | POA: Diagnosis not present

## 2016-02-24 DIAGNOSIS — I10 Essential (primary) hypertension: Secondary | ICD-10-CM

## 2016-02-24 DIAGNOSIS — Z79899 Other long term (current) drug therapy: Secondary | ICD-10-CM | POA: Insufficient documentation

## 2016-02-24 MED ORDER — FUROSEMIDE 40 MG PO TABS: 40.0000 mg | ORAL_TABLET | ORAL | 3 refills | Status: DC

## 2016-02-24 MED ORDER — CARVEDILOL 6.25 MG PO TABS: 9.3750 mg | ORAL_TABLET | Freq: Two times a day (BID) | ORAL | 11 refills | Status: DC

## 2016-02-24 NOTE — Progress Notes (Signed)
Patient ID: [name], male   DOB: [birth year], 31 y.o.   MRN: [id number]    Advanced Heart Failure Clinic Note   PCP: none.  Primary Cardiologist: Dr Aundra Dubin  Pulmonary  HPI: Mr Dibartolo is a 31 yo with OHS/OSA, morbid obesity, HTN, diastolic CHF with prominent RV failure.   Admitted in August 2016  to [facility] with volume overload. Diuresed with lasix drip and diamox. Transitioned to lasix 80 mg twice a day. Discharge weight 257 pounds.   Recent admit to [facility] 3/21 through 04/28/15. Admitted with respiratory distress. Required short term intubation. Apparently had not been wearing BiPap. Discharge weight was 259 pounds.   Today he returns for HF follow up. Overall feeling fair. Has gained weight. Mild dyspnea with exertion. Denies PND/Orthopnea. Eating out most days of the week. He is using his Bipap.  Not exercising. Taking all medications.   ECHO 03/20/2014 EF 60-65%  Labs 09/21/2014: K 3.9 Creatinine 1.03  Labs 11/26/2014: K 4.2 Creatinine 1.08  Labs 12/30/2014: K 4.7 Creatinine 1.05  Labs 04/26/2015: K 4.0 Creatinine 0.94  Labs 10/17: K 4.1, creatinine 1.17  ROS: All systems negative except as listed in HPI, PMH and Problem List.  SH:  Social History   Social History  . Marital status: Single    Spouse name: N/A  . Number of children: 2  . Years of education: N/A   Occupational History  . N/A Not Employed   Social History Main Topics  . Smoking status: Former Smoker    Packs/day: 1.50    Years: 3.00    Types: Cigarettes    Quit date: 10/31/2013  . Smokeless tobacco: Never Used  . Alcohol use No  . Drug use: No     Comment: 10/31/2013 "might use cocaine once/month" - Has not used since 2015.   Marland Kitchen Sexual activity: No   Other Topics Concern  . Not on file   Social History Narrative  . No narrative on file    FH:  Family History  Problem Relation Age of Onset  . Heart disease Mother     Pacemaker  . Hypertension Father   . Cancer Paternal Grandfather   .  Heart attack Neg Hx   . Stroke Neg Hx     Past Medical History:  Diagnosis Date  . Arthritis    "hands, feet" (10/31/2013)  . CHF (congestive heart failure) ([place])   . Chronic bronchitis ([place])   . High cholesterol   . Hypertension   . Obesity   . OSA on CPAP   . Pneumonia 09/2011  . Substance abuse 8/31/213   cociane "first time use"  . Tobacco abuse     Current Outpatient Prescriptions  Medication Sig Dispense Refill  . albuterol (PROVENTIL HFA;VENTOLIN HFA) 108 (90 BASE) MCG/ACT inhaler Inhale 1-2 puffs into the lungs every 6 (six) hours as needed for wheezing or shortness of breath. 1 Inhaler 0  . carvedilol (COREG) 6.25 MG tablet Take 1 tablet (6.25 mg total) by mouth 2 (two) times daily with a meal. 60 tablet 11  . colchicine 0.6 MG tablet Take 1 tablet (0.6 mg total) by mouth daily. 14 tablet 1  . furosemide (LASIX) 40 MG tablet Take 1 tablet (40 mg total) by mouth 2 (two) times daily. 60 tablet 3  . ibuprofen (ADVIL,MOTRIN) 400 MG tablet Take 1 tablet (400 mg total) by mouth every 6 (six) hours as needed. 30 tablet 0  . losartan (COZAAR) 50 MG tablet Take 1  tablet (50 mg total) by mouth daily. 30 tablet 3  . potassium chloride SA (K-DUR,KLOR-CON) 20 MEQ tablet Take 1 tablet (20 mEq total) by mouth daily. 30 tablet 3   No current facility-administered medications for this encounter.     Vitals:   02/24/16 1142  BP: (!) 168/112  Pulse: 87  SpO2: 98%  Weight: 286 lb 9.6 oz (130 kg)   Wt Readings from Last 3 Encounters:  02/24/16 286 lb 9.6 oz (130 kg)  02/14/16 270 lb (122.5 kg)  01/14/16 270 lb (122.5 kg)     PHYSICAL EXAM:  General:  NAD. Obese.   HEENT: normal Neck: supple. JVP ~10 . Carotids 2+ bilaterally; no bruits. No thyromegaly or nodule noted.  Cor: PMI normal. RRR. No M/G/R.  Lungs: CTAB.  Abdomen: obese, NT, ND.  +BS  Extremities: no cyanosis, clubbing, rash, Rand LLE 1+ edema.  Neuro: alert & orientedx3, cranial nerves grossly intact. Moves all 4  extremities w/o difficulty. Affect pleasant.   ASSESSMENT & PLAN: 31 yo with OHS/OSA, morbid obesity, diastolic CHF with prominent RV failure.  1.Chronic diastolic CHF with prominent RV failure: EF normal on last echo 02/2014 with moderate RV dilation/moderate RV systolic dysfunction.NYHA II-III. Volume status elevated. Increase lasix to 80 mg in am and continue 40 mg in pm. Repeat ECHO Instructed to do the following things EVERYDAY: 1) Weigh yourself in the morning before breakfast. Write it down and keep it in a log. 2) Take your medicines as prescribed 3) Eat low salt foods-Limit salt (sodium) to 2000 mg per day.  4) Stay as active as you can everyday 5) Limit all fluids for the day to less than 2 liters   2. OHS/OSA:  Continue Bipap nightly. - Followed by pulmonary.     3. HTN:  Elevated. As above increase coreg and lasix .   4. Morbid Obesity:  Discussed portion control.   Follow up in 2-3 weeks.    Fraidy Mccarrick NP-C  02/24/2016

## 2016-02-24 NOTE — Patient Instructions (Addendum)
INCREASE Coreg to 9.375mg , one and one half tab twice a day INCREASE Lasix to 80 mg (2 tabs) in the AM and 40 mg (1 tab) in th PM  Your physician has requested that you have an echocardiogram. Echocardiography is a painless test that uses sound waves to create images of your heart. It provides your doctor with information about the size and shape of your heart and how well your heart's chambers and valves are working. This procedure takes approximately one hour. There are no restrictions for this procedure.   Your physician recommends that you schedule a follow-up appointment in: 2-3 weeks with DR [facility]  Do the following things EVERYDAY: 1) Weigh yourself in the morning before breakfast. Write it down and keep it in a log. 2) Take your medicines as prescribed 3) Eat low salt foods-Limit salt (sodium) to 2000 mg per day.  4) Stay as active as you can everyday 5) Limit all fluids for the day to less than 2 liters

## 2016-02-24 NOTE — Progress Notes (Signed)
Advanced Heart Failure Medication Review by a Pharmacist  Does the patient  feel that his/her medications are working for him/her?  yes  Has the patient been experiencing any side effects to the medications prescribed?  no  Does the patient measure his/her own blood pressure or blood glucose at home?  no   Does the patient have any problems obtaining medications due to transportation or finances?   No - [place] Medicaid  Understanding of regimen: good Understanding of indications: good Potential of compliance: good Patient understands to avoid NSAIDs. Patient understands to avoid decongestants.  Issues to address at subsequent visits: None   Pharmacist comments: Mr. Bahl is a pleasant 31 yo M presenting without a medication list but with fair understanding of his regimen. He reports great compliance with his regimen and states that he does not understand his weight gain today. He did not have any other medication-related questions or concerns for me at this time.   [name], PharmD, BCPS, CPP Clinical Pharmacist Pager: [phone] Phone: [phone] 02/24/2016 11:53 AM      Time with patient: 10 minutes Preparation and documentation time: 2 minutes Total time: 12 minutes

## 2016-03-17 ENCOUNTER — Encounter: Payer: Self-pay | Admitting: Pulmonary Disease

## 2016-03-17 ENCOUNTER — Ambulatory Visit (INDEPENDENT_AMBULATORY_CARE_PROVIDER_SITE_OTHER): Payer: Medicaid Other | Admitting: Pulmonary Disease

## 2016-03-17 VITALS — BP 138/86 | HR 80 | Ht 64.0 in | Wt 288.6 lb

## 2016-03-17 DIAGNOSIS — G4733 Obstructive sleep apnea (adult) (pediatric): Secondary | ICD-10-CM | POA: Diagnosis not present

## 2016-03-17 DIAGNOSIS — J9612 Chronic respiratory failure with hypercapnia: Secondary | ICD-10-CM

## 2016-03-17 DIAGNOSIS — E662 Morbid (severe) obesity with alveolar hypoventilation: Secondary | ICD-10-CM | POA: Diagnosis not present

## 2016-03-17 DIAGNOSIS — Z6841 Body Mass Index (BMI) 40.0 and over, adult: Secondary | ICD-10-CM | POA: Diagnosis not present

## 2016-03-17 DIAGNOSIS — I5032 Chronic diastolic (congestive) heart failure: Secondary | ICD-10-CM

## 2016-03-17 DIAGNOSIS — I2781 Cor pulmonale (chronic): Secondary | ICD-10-CM

## 2016-03-17 DIAGNOSIS — Z0000 Encounter for general adult medical examination without abnormal findings: Secondary | ICD-10-CM | POA: Diagnosis not present

## 2016-03-17 DIAGNOSIS — J9611 Chronic respiratory failure with hypoxia: Secondary | ICD-10-CM | POA: Diagnosis not present

## 2016-03-17 MED ORDER — ALBUTEROL SULFATE HFA 108 (90 BASE) MCG/ACT IN AERS: 1.0000 | INHALATION_SPRAY | Freq: Four times a day (QID) | RESPIRATORY_TRACT | 5 refills | Status: DC | PRN

## 2016-03-17 NOTE — Patient Instructions (Signed)
Will have Bipap changed to 22/18 cm H2O  Will have [employer] assess for portable oxygen concentrator  Will arrange for referral to new primary care provider  Follow up in 3 months

## 2016-03-17 NOTE — Progress Notes (Signed)
Current Outpatient Prescriptions on File Prior to Visit  Medication Sig  . carvedilol (COREG) 6.25 MG tablet Take 1.5 tablets (9.375 mg total) by mouth 2 (two) times daily with a meal.  . colchicine 0.6 MG tablet Take 1 tablet (0.6 mg total) by mouth daily.  . furosemide (LASIX) 40 MG tablet Take 1-2 tablets (40-80 mg total) by mouth as directed. Take 80 mg in the AM and 40 mg in the PM  . ibuprofen (ADVIL,MOTRIN) 400 MG tablet Take 1 tablet (400 mg total) by mouth every 6 (six) hours as needed.  Marland Kitchen losartan (COZAAR) 50 MG tablet Take 1 tablet (50 mg total) by mouth daily.  . potassium chloride SA (K-DUR,KLOR-CON) 20 MEQ tablet Take 1 tablet (20 mEq total) by mouth daily.   No current facility-administered medications on file prior to visit.     Chief Complaint  Patient presents with  . Follow-up    Pt states that he has not been using CPAP consistently. Pt O2 sats upon arrival were 80% on RA, after resting O2 sats recovered to 93%. Pt states that he wears O2 at night. Denies any issues with SOB when walking.       Sleep tests PSG 08/14/09 >> AHI 105.4, SaO2 low 56%, BiPAP 21/17 cm H2O Split 02/05/14 >> AHI 78.2, SaO2 low 52%. CPAP 13 cm H2O >> AHI 5.4, still had low SaO2 >> centrals with higher CPAP/EPAP. ONO with 5 liters 02/27/14 >> test time 58 min. Basal SpO2 66%, low SpO2 50%. Spent 55 min with SpO2 < 88%. Bipap 02/15/16 to 03/15/16 >> used on 22 of 30 nights with average 1 hr 34 min.  Average AHI 3.1 with Bipap 21/17 cm H2O  Pulmonary tests V/Q scan 11/01/13 >> upper lobe airtrapping b/l ABG 04/28/15 >> pH 7.37, PCO2 55.5, PO2 65.6  Cardiac tests Echo 11/01/13 >> severe LVH, EF 55 to 60%, mod RA/RV dilation Echo 03/20/14 >> mild LVH, EF 60 to 123456, mod RV systolic dysfx  Past medical history HTN, Diastolic CHF, HLD, ACE inhibitor cough  Past surgical history, Family history, Social history, Allergies reviewed  Vital signs BP 138/86 (BP Location: Left Arm, Cuff Size: Normal)    Pulse 80   Ht 5\' 4"  (1.626 m)   Wt 288 lb 9.6 oz (130.9 kg)   SpO2 93%   BMI 49.54 kg/m    History of Present Illness: Douglas [name]. is a 31 y.o. male former smoker with chronic respiratory failure from OSA, OHS, cor pulmonale, and diastolic CHF.  He is trying to work on his weight, but this is a struggle.  He gets occasional cough, and albuterol helps.  He denies wheeze, or chest pain.  He has not had leg swelling recently.  He gets tired easily.  He isn't using his oxygen during the day >> he didn't think he needed it anymore.  He still uses oxygen at night.  He tries using Bipap, but feels the pressure needs to be higher.  His daughter tells him he snores too much when not using Bipap.  He denies fever, sinus congestion, or abdominal pain.  Physical Exam:  General - pleasant Eyes - pupils reactive ENT - no sinus tenderness, MP 4, no oral exudate, no LAN Cardiac - regular, no murmur Chest - decreased BS, no wheeze Back - no tenderness Abd - soft, non tender Ext - no edema Neuro - normal strength Skin - no rashes Psych - normal mood   CMP Latest Ref Rng &  Units 02/14/2016 01/14/2016 11/11/2015  Glucose 65 - 99 mg/dL 85 84 100(H)  BUN 6 - 20 mg/dL 9 13 15   Creatinine 0.61 - 1.24 mg/dL 0.90 1.18 1.17  Sodium 135 - 145 mmol/L 138 139 136  Potassium 3.5 - 5.1 mmol/L 4.0 4.2 4.1  Chloride 101 - 111 mmol/L 88(L) 92(L) 96(L)  CO2 22 - 32 mmol/L 42(H) 38(H) 30  Calcium 8.9 - 10.3 mg/dL 8.9 8.2(L) 9.0  Total Protein 6.5 - 8.1 g/dL - - -  Total Bilirubin 0.3 - 1.2 mg/dL - - -  Alkaline Phos 38 - 126 U/L - - -  AST 15 - 41 U/L - - -  ALT 17 - 63 U/L - - -    CBC Latest Ref Rng & Units 02/14/2016 01/14/2016 04/26/2015  WBC 4.0 - 10.5 K/uL 10.7(H) 8.8 5.6  Hemoglobin 13.0 - 17.0 g/dL 18.1(H) 18.2(H) 16.5  Hematocrit 39.0 - 52.0 % 60.6(H) 56.6(H) 55.6(H)  Platelets 150 - 400 K/uL 239 228 161     Assessment/Plan:  Obstructive sleep apnea. - discussed importance of  maintaining compliance with Bipap - will change to Bipap 22/18 cm H2O  Chronic hypoxic/hypercapnic respiratory failure with cor pulmonale 2nd to besity hypoventilation syndrome. - explained to him importance of using supplemental oxygen and how low oxygen levels can impact his health - continue 3 liters oxygen during the day with exertion and at night with Bipap - will have his DME assess for POC  Morbid obesity - discussed options to assist with weight loss  Diastolic heart failure - f/u with cardiology  Cough. - refilled albuterol   Patient Instructions  Will have Bipap changed to 22/18 cm H2O  Will have [employer] assess for portable oxygen concentrator  Will arrange for referral to new primary care provider  Follow up in 3 months    [name], MD [place] [facility]/Critical Care/Sleep Pager:  [phone] 03/17/2016, 12:05 PM

## 2016-03-18 ENCOUNTER — Encounter (HOSPITAL_COMMUNITY): Payer: Medicaid Other

## 2016-03-18 ENCOUNTER — Ambulatory Visit (HOSPITAL_COMMUNITY): Admission: RE | Admit: 2016-03-18 | Payer: Medicaid Other | Source: Ambulatory Visit

## 2016-03-30 ENCOUNTER — Encounter (HOSPITAL_COMMUNITY): Payer: Self-pay | Admitting: *Deleted

## 2016-04-01 ENCOUNTER — Ambulatory Visit: Payer: Medicaid Other

## 2016-04-07 ENCOUNTER — Other Ambulatory Visit (HOSPITAL_COMMUNITY): Payer: Self-pay | Admitting: Cardiology

## 2016-04-07 DIAGNOSIS — I5032 Chronic diastolic (congestive) heart failure: Secondary | ICD-10-CM

## 2016-06-01 ENCOUNTER — Ambulatory Visit: Payer: Medicaid Other | Admitting: Acute Care

## 2016-06-07 ENCOUNTER — Encounter: Payer: Self-pay | Admitting: Pulmonary Disease

## 2016-06-07 ENCOUNTER — Ambulatory Visit (INDEPENDENT_AMBULATORY_CARE_PROVIDER_SITE_OTHER): Payer: Medicaid Other | Admitting: Pulmonary Disease

## 2016-06-07 VITALS — BP 120/72 | HR 86 | Ht 64.0 in | Wt 310.8 lb

## 2016-06-07 DIAGNOSIS — I2781 Cor pulmonale (chronic): Secondary | ICD-10-CM | POA: Diagnosis not present

## 2016-06-07 DIAGNOSIS — J9611 Chronic respiratory failure with hypoxia: Secondary | ICD-10-CM | POA: Diagnosis not present

## 2016-06-07 DIAGNOSIS — Z6841 Body Mass Index (BMI) 40.0 and over, adult: Secondary | ICD-10-CM

## 2016-06-07 DIAGNOSIS — G4733 Obstructive sleep apnea (adult) (pediatric): Secondary | ICD-10-CM | POA: Diagnosis not present

## 2016-06-07 NOTE — Progress Notes (Signed)
Current Outpatient Prescriptions on File Prior to Visit  Medication Sig  . albuterol (PROVENTIL HFA;VENTOLIN HFA) 108 (90 Base) MCG/ACT inhaler Inhale 1-2 puffs into the lungs every 6 (six) hours as needed for wheezing or shortness of breath.  . carvedilol (COREG) 6.25 MG tablet Take 1.5 tablets (9.375 mg total) by mouth 2 (two) times daily with a meal.  . colchicine 0.6 MG tablet Take 1 tablet (0.6 mg total) by mouth daily.  Marland Kitchen ibuprofen (ADVIL,MOTRIN) 400 MG tablet Take 1 tablet (400 mg total) by mouth every 6 (six) hours as needed.  Marland Kitchen losartan (COZAAR) 50 MG tablet TAKE ONE (1) TABLET BY MOUTH EVERY DAY  . potassium chloride SA (K-DUR,KLOR-CON) 20 MEQ tablet TAKE ONE (1) TABLET BY MOUTH EVERY DAY  . furosemide (LASIX) 40 MG tablet Take 1-2 tablets (40-80 mg total) by mouth as directed. Take 80 mg in the AM and 40 mg in the PM   No current facility-administered medications on file prior to visit.     Chief Complaint  Patient presents with  . Follow-up    Pt has not used CPAP x 2 weeks - machine is not functioning properly. DME: [facility] ; Pt having low O2 levels upon arrival and at rest ranging from 76%-94%, placed on 3 liters of O2 and levels recovered to 92%     Sleep tests PSG 08/14/09 >> AHI 105.4, SaO2 low 56%, BiPAP 21/17 cm H2O Split 02/05/14 >> AHI 78.2, SaO2 low 52%. CPAP 13 cm H2O >> AHI 5.4, still had low SaO2 >> centrals with higher CPAP/EPAP. ONO with 5 liters 02/27/14 >> test time 58 min. Basal SpO2 66%, low SpO2 50%. Spent 55 min with SpO2 < 88%. Bipap 03/05/16 to 06/02/16 >> used on 62 of 90 nights with average 1 hr 28 min.  Average AHI 3.9 with Bipap 22/18 cm H20  Pulmonary tests V/Q scan 11/01/13 >> upper lobe airtrapping b/l ABG 04/28/15 >> pH 7.37, PCO2 55.5, PO2 65.6  Cardiac tests Echo 11/01/13 >> severe LVH, EF 55 to 60%, mod RA/RV dilation Echo 03/20/14 >> mild LVH, EF 60 to 37%, mod RV systolic dysfx  Past medical history HTN, Diastolic CHF, HLD, ACE inhibitor  cough  Past surgical history, Family history, Social history, Allergies reviewed  Vital signs BP 120/72 (BP Location: Right Leg, Cuff Size: Normal)   Pulse 86   Ht 5\' 4"  (1.626 m)   Wt (!) 310 lb 12.8 oz (141 kg)   SpO2 92%   BMI 53.35 kg/m    History of Present Illness: Douglas [name]. is a 31 y.o. male former smoker with chronic respiratory failure from OSA, OHS, cor pulmonale, and diastolic CHF.  His Bipap adaptor for mask broke.  He has been trying to work around this, but hasn't been able to use Bipap as much.  He feels his sleep is worse, and more sleepy during the day.  He hasn't been using oxygen during the day.  He says he never got oxygen set up to use during the day.  He denies chest pain, cough, or abdominal pain.  Leg swelling has been controlled.  He is not very active.  He is not having any success with weight loss efforts.  Physical Exam:  General - pleasant Eyes - pupils reactive ENT - no sinus tenderness, no oral exudate, no LAN, MP 4 Cardiac - regular, no murmur Chest - no wheeze, rales Abd - soft, non tender Ext - no edema Skin - no rashes Neuro -  normal strength Psych - normal mood   Assessment/Plan:  Obstructive sleep apnea. - he is trying to be compliant, but his equipment is not working - he otherwise reports benefit from therapy when his equipment is working - will arrange for his DME to assess his Bipap function - continue Bipap 22/18 cm H2O   Chronic hypoxic/hypercapnic respiratory failure with cor pulmonale 2nd to obesity hypoventilation syndrome. - will make sure he has supplemental oxygen to use during the day as well as at night with Bipap - continue 3 liters oxygen with exertion and at night with Bipap  Morbid obesity - explained how his weight is main contributor to his respiratory problems - explained that if he can't lose significant amount of weight and is not able to keep up with Bipap/oxygen, then he might very well end up  needing trach and nocturnal vent support  Diastolic heart failure - f/u with cardiology  Cough. - prn albuterol   Patient Instructions  Will arrange for 3 liters oxygen during the day Will have [employer] fix your sleep apnea machine  Follow up in 4 months  Time spent 26 minutes  [name], MD [facility] Pager:  [phone] 06/07/2016, 12:17 PM

## 2016-06-07 NOTE — Patient Instructions (Signed)
Will arrange for 3 liters oxygen during the day Will have [employer] fix your sleep apnea machine  Follow up in 4 months

## 2016-06-13 ENCOUNTER — Telehealth: Payer: Self-pay | Admitting: Pulmonary Disease

## 2016-06-13 NOTE — Telephone Encounter (Signed)
Called and spoke with pt and he stated that [employer] came out and delivered the tank and stated that the other person was coming but had some other stops to make first   I called [employer] and they stated that the person is out in the field and she has 2 other stops and she will be there to see about his machine.  I called the pt back to make him aware.

## 2016-06-23 ENCOUNTER — Ambulatory Visit: Payer: Medicaid Other | Admitting: Pulmonary Disease

## 2016-06-27 ENCOUNTER — Encounter (HOSPITAL_COMMUNITY): Payer: Medicaid Other

## 2016-06-27 ENCOUNTER — Ambulatory Visit (HOSPITAL_COMMUNITY): Admission: RE | Admit: 2016-06-27 | Payer: Medicaid Other | Source: Ambulatory Visit

## 2016-07-06 ENCOUNTER — Ambulatory Visit (HOSPITAL_COMMUNITY): Payer: Medicaid Other

## 2016-07-14 ENCOUNTER — Ambulatory Visit (HOSPITAL_COMMUNITY)
Admission: RE | Admit: 2016-07-14 | Discharge: 2016-07-14 | Disposition: A | Discharge status: Home / Self Care | Payer: Medicaid Other | Source: Ambulatory Visit | Attending: Cardiology | Admitting: Cardiology

## 2016-07-14 DIAGNOSIS — I5032 Chronic diastolic (congestive) heart failure: Secondary | ICD-10-CM

## 2016-07-18 ENCOUNTER — Encounter (HOSPITAL_COMMUNITY): Payer: Medicaid Other

## 2016-07-25 ENCOUNTER — Other Ambulatory Visit (HOSPITAL_COMMUNITY): Payer: Self-pay | Admitting: Adult Health

## 2016-07-26 ENCOUNTER — Telehealth: Payer: Self-pay | Admitting: Licensed Clinical Social Worker

## 2016-07-26 NOTE — Telephone Encounter (Signed)
CSW referred to assist due to multiple no show and cancelled appointments. CSW contacted patient and message left for return call. Raquel Sarna, [place], [place]

## 2016-07-26 NOTE — Telephone Encounter (Signed)
Patient returned call and stated he has been having difficulty obtaining transportation to appointments and asked for assistance with completing a SCAT application. CSW discussed with receptionist about rescheduling appointment and arranging for a cab ride. CSW will complete SCAT application on next visit. Douglas [name], [place], [place]

## 2016-08-23 ENCOUNTER — Other Ambulatory Visit (HOSPITAL_COMMUNITY): Payer: Self-pay | Admitting: Cardiology

## 2016-08-23 DIAGNOSIS — I5032 Chronic diastolic (congestive) heart failure: Secondary | ICD-10-CM

## 2016-08-24 ENCOUNTER — Encounter (HOSPITAL_COMMUNITY): Payer: Self-pay

## 2016-08-24 ENCOUNTER — Telehealth: Payer: Self-pay | Admitting: Licensed Clinical Social Worker

## 2016-08-24 ENCOUNTER — Ambulatory Visit (HOSPITAL_COMMUNITY)
Admission: RE | Admit: 2016-08-24 | Discharge: 2016-08-24 | Disposition: A | Discharge status: Home / Self Care | Payer: Medicaid Other | Source: Ambulatory Visit | Attending: Cardiology | Admitting: Cardiology

## 2016-08-24 VITALS — BP 126/74 | HR 93 | Wt 325.2 lb

## 2016-08-24 DIAGNOSIS — Z6841 Body Mass Index (BMI) 40.0 and over, adult: Secondary | ICD-10-CM | POA: Diagnosis not present

## 2016-08-24 DIAGNOSIS — Z809 Family history of malignant neoplasm, unspecified: Secondary | ICD-10-CM | POA: Insufficient documentation

## 2016-08-24 DIAGNOSIS — Z5189 Encounter for other specified aftercare: Secondary | ICD-10-CM | POA: Insufficient documentation

## 2016-08-24 DIAGNOSIS — G4733 Obstructive sleep apnea (adult) (pediatric): Secondary | ICD-10-CM | POA: Insufficient documentation

## 2016-08-24 DIAGNOSIS — I110 Hypertensive heart disease with heart failure: Secondary | ICD-10-CM | POA: Diagnosis not present

## 2016-08-24 DIAGNOSIS — Z79899 Other long term (current) drug therapy: Secondary | ICD-10-CM | POA: Insufficient documentation

## 2016-08-24 DIAGNOSIS — Z8249 Family history of ischemic heart disease and other diseases of the circulatory system: Secondary | ICD-10-CM | POA: Diagnosis not present

## 2016-08-24 DIAGNOSIS — I272 Pulmonary hypertension, unspecified: Secondary | ICD-10-CM | POA: Diagnosis not present

## 2016-08-24 DIAGNOSIS — I5032 Chronic diastolic (congestive) heart failure: Secondary | ICD-10-CM | POA: Diagnosis not present

## 2016-08-24 DIAGNOSIS — I10 Essential (primary) hypertension: Secondary | ICD-10-CM | POA: Diagnosis not present

## 2016-08-24 DIAGNOSIS — Z87891 Personal history of nicotine dependence: Secondary | ICD-10-CM | POA: Diagnosis not present

## 2016-08-24 LAB — BASIC METABOLIC PANEL
Anion gap: 8 (ref 5–15)
BUN: 15 mg/dL (ref 6–20)
CO2: 40 mmol/L — ABNORMAL HIGH (ref 22–32)
CREATININE: 1.26 mg/dL — AB (ref 0.61–1.24)
Calcium: 8.8 mg/dL — ABNORMAL LOW (ref 8.9–10.3)
Chloride: 88 mmol/L — ABNORMAL LOW (ref 101–111)
Glucose, Bld: 118 mg/dL — ABNORMAL HIGH (ref 65–99)
POTASSIUM: 4.4 mmol/L (ref 3.5–5.1)
SODIUM: 136 mmol/L (ref 135–145)

## 2016-08-24 MED ORDER — FUROSEMIDE 40 MG PO TABS: 80.0000 mg | ORAL_TABLET | Freq: Two times a day (BID) | ORAL | 3 refills | Status: DC

## 2016-08-24 NOTE — Patient Instructions (Signed)
INCREASE Lasix to 80 mg (2 Tablets) Twice Daily  Labs today (will call for abnormal results, otherwise no news is good news)  Please weigh yourself on the scale we have provided everyday and bring recordings to your next office visit.  Wear 2 L of continuous oxygen.  Follow up in 2 weeks

## 2016-08-24 NOTE — Progress Notes (Signed)
Patient ID: [name], male   DOB: January 14, 1986, 31 y.o.   MRN: [id number]    Advanced Heart Failure Clinic Note   PCP: none.  Primary Cardiologist: Dr Aundra Dubin  Pulmonary  HPI: Mr Gilberg is a 31 yo with OHS/OSA, morbid obesity, HTN, diastolic CHF with prominent RV failure.   Admitted in August 2016  to [facility] with volume overload. Diuresed with lasix drip and diamox. Transitioned to lasix 80 mg twice a day. Discharge weight 257 pounds.   Recent admit to [facility] 3/21 through 04/28/15. Admitted with respiratory distress. Required short term intubation. Apparently had not been wearing BiPap. Discharge weight was 259 pounds.   He returns today for HF follow up. Feels SOB with walking into clinic, SOB with little activity. His weight is up 15 pounds from last visit in May. He is taking his medications, but reports dietary noncompliance. Drinking more than 2L a day. Denies chest pain and palpitations. Endorses orthopnea.   ECHO 03/20/2014 EF 60-65%  Labs 09/21/2014: K 3.9 Creatinine 1.03  Labs 11/26/2014: K 4.2 Creatinine 1.08  Labs 12/30/2014: K 4.7 Creatinine 1.05  Labs 04/26/2015: K 4.0 Creatinine 0.94  Labs 10/17: K 4.1, creatinine 1.17  ROS: All systems negative except as listed in HPI, PMH and Problem List.  SH:  Social History   Social History  . Marital status: Single    Spouse name: N/A  . Number of children: 2  . Years of education: N/A   Occupational History  . N/A Not Employed   Social History Main Topics  . Smoking status: Former Smoker    Packs/day: 1.50    Years: 3.00    Types: Cigarettes    Quit date: 10/31/2013  . Smokeless tobacco: Never Used  . Alcohol use No  . Drug use: No     Comment: 10/31/2013 "might use cocaine once/month" - Has not used since 2015.   Marland Kitchen Sexual activity: No   Other Topics Concern  . Not on file   Social History Narrative  . No narrative on file    FH:  Family History  Problem Relation Age of Onset  . Heart disease Mother     Pacemaker  . Hypertension Father   . Cancer Paternal Grandfather   . Heart attack Neg Hx   . Stroke Neg Hx     Past Medical History:  Diagnosis Date  . Arthritis    "hands, feet" (10/31/2013)  . CHF (congestive heart failure) ([place])   . Chronic bronchitis ([place])   . High cholesterol   . Hypertension   . Obesity   . OSA on CPAP   . Pneumonia 09/2011  . Substance abuse 8/31/213   cociane "first time use"  . Tobacco abuse     Current Outpatient Prescriptions  Medication Sig Dispense Refill  . albuterol (PROVENTIL HFA;VENTOLIN HFA) 108 (90 Base) MCG/ACT inhaler Inhale 1-2 puffs into the lungs every 6 (six) hours as needed for wheezing or shortness of breath. 1 Inhaler 5  . carvedilol (COREG) 6.25 MG tablet Take 1.5 tablets (9.375 mg total) by mouth 2 (two) times daily with a meal. 90 tablet 11  . colchicine 0.6 MG tablet Take 1 tablet (0.6 mg total) by mouth daily. 14 tablet 1  . furosemide (LASIX) 40 MG tablet TAKE 2 TABLETS BY MOUTH EVERY MORNING AND TAKE 1 TABLET BY MOUTH EVERY EVENING 90 tablet 3  . ibuprofen (ADVIL,MOTRIN) 400 MG tablet Take 1 tablet (400 mg total) by mouth every 6 (six)  hours as needed. 30 tablet 0  . losartan (COZAAR) 50 MG tablet TAKE ONE (1) TABLET BY MOUTH EVERY DAY 30 tablet 1  . potassium chloride SA (K-DUR,KLOR-CON) 20 MEQ tablet TAKE ONE (1) TABLET BY MOUTH EVERY DAY 30 tablet 1   No current facility-administered medications for this encounter.     Vitals:   08/24/16 0917 08/24/16 0954  BP: (!) 150/107 126/74  Pulse: 93   SpO2: 93%   Weight: (!) 325 lb 3.2 oz (147.5 kg)    Wt Readings from Last 3 Encounters:  08/24/16 (!) 325 lb 3.2 oz (147.5 kg)  06/07/16 (!) 310 lb 12.8 oz (141 kg)  03/17/16 288 lb 9.6 oz (130.9 kg)     PHYSICAL EXAM:  General: Obese male. Walked into clinic with SOB.   HEENT: normal Neck: supple. JVP to jaw. Carotids 2+ bilaterally; no bruits. No thyromegaly or nodule noted.  Cor: PMI normal. Regular rate and rhythm. No  M/R/G noted.  Lungs: Clear in upper lobes, diminished crackles in bases.  Abdomen: Obese, soft, non tender. Distended.  Extremities: no cyanosis, clubbing, rash, 2+ pedal edema bilaterally.  Neuro: alert & orientedx3, cranial nerves grossly intact. Moves all 4 extremities w/o difficulty. Affect pleasant.   ASSESSMENT & PLAN:  1.Chronic diastolic CHF with prominent RV failure: Echo 02/2014 EF 60%. RV hk - Volume overloaded on exam.  - NYHA III - Increase lasix to 80 mg BID.  - I have made an urgent referral to paramedicine who will visit him this week. I worry that he is at high risk for hospitalization. I am unsure of his compliance with medications.   Instructed to do the following things EVERYDAY: 1) Weigh yourself in the morning before breakfast. Write it down and keep it in a log. 2) Take your medicines as prescribed 3) Eat low salt foods-Limit salt (sodium) to 2000 mg per day.  4) Stay as active as you can everyday 5) Limit all fluids for the day to less than 2 liters   2. OHS/OSA:   - Continue to wear CPAP nightly.    3. HTN:  BP elevated today, he has not taken his medications.  - Will have paramedicine check his BP this week.     4. Morbid Obesity: - Body mass index is 55.82 kg/m. - Encouraged him to eat healthy foods, portion control  Follow up in 2 weeks, sooner if needed. BMET today.   Arbutus Leas NP-C  08/24/2016  Greater than 50% of the (total minutes 40) visit spent in counseling/coordination of care regarding dietary compliance, medication compliance and disease state education.

## 2016-08-24 NOTE — Telephone Encounter (Signed)
CSW received referral to assist with paramedicine. CSW completed paperwork and spoke with [facility] about referral. Raquel Sarna, [place], [place]

## 2016-08-30 ENCOUNTER — Encounter (HOSPITAL_COMMUNITY): Payer: Self-pay | Admitting: Cardiology

## 2016-08-30 ENCOUNTER — Ambulatory Visit (HOSPITAL_COMMUNITY)
Admission: RE | Admit: 2016-08-30 | Discharge: 2016-08-30 | Disposition: A | Discharge status: Home / Self Care | Payer: Medicaid Other | Source: Ambulatory Visit | Attending: Cardiology | Admitting: Cardiology

## 2016-08-30 ENCOUNTER — Ambulatory Visit (HOSPITAL_BASED_OUTPATIENT_CLINIC_OR_DEPARTMENT_OTHER)
Admission: RE | Admit: 2016-08-30 | Discharge: 2016-08-30 | Disposition: A | Discharge status: Home / Self Care | Payer: Medicaid Other | Source: Ambulatory Visit | Attending: Cardiology | Admitting: Cardiology

## 2016-08-30 ENCOUNTER — Other Ambulatory Visit (HOSPITAL_COMMUNITY): Payer: Self-pay

## 2016-08-30 VITALS — BP 145/98 | HR 65 | Wt 318.5 lb

## 2016-08-30 DIAGNOSIS — I110 Hypertensive heart disease with heart failure: Secondary | ICD-10-CM | POA: Insufficient documentation

## 2016-08-30 DIAGNOSIS — I5032 Chronic diastolic (congestive) heart failure: Secondary | ICD-10-CM | POA: Diagnosis present

## 2016-08-30 DIAGNOSIS — I358 Other nonrheumatic aortic valve disorders: Secondary | ICD-10-CM | POA: Insufficient documentation

## 2016-08-30 DIAGNOSIS — G4733 Obstructive sleep apnea (adult) (pediatric): Secondary | ICD-10-CM

## 2016-08-30 LAB — BRAIN NATRIURETIC PEPTIDE: B NATRIURETIC PEPTIDE 5: 300.6 pg/mL — AB (ref 0.0–100.0)

## 2016-08-30 LAB — BASIC METABOLIC PANEL
Anion gap: 9 (ref 5–15)
BUN: 16 mg/dL (ref 6–20)
CHLORIDE: 92 mmol/L — AB (ref 101–111)
CO2: 36 mmol/L — ABNORMAL HIGH (ref 22–32)
Calcium: 8.8 mg/dL — ABNORMAL LOW (ref 8.9–10.3)
Creatinine, Ser: 1.13 mg/dL (ref 0.61–1.24)
GFR calc non Af Amer: >60 mL/min (ref >60)
Glucose, Bld: 83 mg/dL (ref 65–99)
POTASSIUM: 5.1 mmol/L (ref 3.5–5.1)
SODIUM: 137 mmol/L (ref 135–145)

## 2016-08-30 MED ORDER — LOSARTAN POTASSIUM 50 MG PO TABS: 50.0000 mg | ORAL_TABLET | Freq: Two times a day (BID) | ORAL | 3 refills | Status: DC

## 2016-08-30 MED ORDER — FUROSEMIDE 40 MG PO TABS: 120.0000 mg | ORAL_TABLET | Freq: Two times a day (BID) | ORAL | 3 refills | Status: DC

## 2016-08-30 MED ORDER — CARVEDILOL 6.25 MG PO TABS: 9.3750 mg | ORAL_TABLET | Freq: Two times a day (BID) | ORAL | 11 refills | Status: DC

## 2016-08-30 NOTE — Progress Notes (Signed)
  Echocardiogram 2D Echocardiogram has been performed.  Cristian Grieves L Androw 08/30/2016, 1:14 PM

## 2016-08-30 NOTE — Progress Notes (Signed)
Paramedicine Encounter   Patient ID: Douglas [name] , male,   DOB: 23-Nov-1985,30 y.o.,  MRN: [id number]   Mr. Fyfe was seen in the clinic today with the provider for our initial meeting. He advised he has not been using home oxygen because the company that brought it out did not show him how to use the condenser and portable bottle properly. He maintains high blood pressure and as a result Dr. Aundra Dubin has increased losartan to 50 mg BID and furosemide to 120 mg BID. Mr. Mcquarrie stated he did not have a pillbox so one was provided and filled for him today. He also stated he did not have a working [place] because the tubing broke while he was trying to remove it from the oxygen condenser. I advised I would come by his apartment on Thursday and show him how the machine works as well as how to use the portable bottle.   Time spent with patient: 38 minutes  [name], EMT 08/30/2016   ACTION: Next visit planned for 1 week

## 2016-08-30 NOTE — Patient Instructions (Signed)
Increase Losartan to 50 mg Twice daily   Increase Furosemide to 120 mg (3 tabs) Twice daily   Labs today  Chest X-ray and VQ scan  Your physician recommends that you schedule a follow-up appointment in: 1 week

## 2016-08-31 ENCOUNTER — Telehealth (HOSPITAL_COMMUNITY): Payer: Self-pay | Admitting: *Deleted

## 2016-08-31 NOTE — Telephone Encounter (Signed)
-----   Message from Larey Dresser, MD sent at 08/30/2016  5:12 PM EDT ----- Stop KCl supplement.  BMET 1 week

## 2016-09-01 ENCOUNTER — Other Ambulatory Visit (HOSPITAL_COMMUNITY): Payer: Self-pay

## 2016-09-01 NOTE — Progress Notes (Signed)
Patient ID: [name], male   DOB: Jan 07, 1986, 31 y.o.   MRN: [id number]    Advanced Heart Failure Clinic Note   PCP: none.  Primary Cardiologist: Dr Aundra Dubin  Pulmonary  HPI: Douglas [name] is a 31 yo with OHS/OSA, morbid obesity, HTN, diastolic CHF with prominent RV failure.   Admitted in August 2016  to [facility] with volume overload. Diuresed with lasix drip and diamox. Transitioned to lasix 80 mg twice a day. Discharge weight 257 pounds.   Recent admit to [facility] 3/21 through 04/28/15. Admitted with respiratory distress. Required short term intubation. Apparently had not been wearing BiPap. Discharge weight was 259 pounds.   He was seen last week by the NP in this clinic.  He was markedly volume overloaded.  Lasix was increased.  He is down 7 lbs.  He feels better but still gets short of breath walking 100 yards or with walking up stairs.  No orthopnea/PND.  No chest pain.  Wearing CPAP at night, supposed to wear oxygen at all times during the day but not wearing it currently.    - ECHO 03/20/2014 EF 60-65% - ECHO 8/18: EF 55-60%, grade II diastolic dydsfunction, D-shaped septum suggesting RV pressure/volume overload, severe RV dilation with mild-moderately decreased RV systolic function, unable to estimate PA pressure.   Labs 09/21/2014: K 3.9 Creatinine 1.03  Labs 11/26/2014: K 4.2 Creatinine 1.08  Labs 12/30/2014: K 4.7 Creatinine 1.05  Labs 04/26/2015: K 4.0 Creatinine 0.94  Labs 10/17: K 4.1, creatinine 1.17 Labs 8/18: K 4.4, creatinine 1.26  ROS: All systems negative except as listed in HPI, PMH and Problem List.  SH:  Social History   Social History  . Marital status: Single    Spouse name: N/A  . Number of children: 2  . Years of education: N/A   Occupational History  . N/A Not Employed   Social History Main Topics  . Smoking status: Former Smoker    Packs/day: 1.50    Years: 3.00    Types: Cigarettes    Quit date: 10/31/2013  . Smokeless tobacco: Never Used  .  Alcohol use No  . Drug use: No     Comment: 10/31/2013 "might use cocaine once/month" - Has not used since 2015.   Marland Kitchen Sexual activity: No   Other Topics Concern  . Not on file   Social History Narrative  . No narrative on file    FH:  Family History  Problem Relation Age of Onset  . Heart disease Mother        Pacemaker  . Hypertension Father   . Cancer Paternal Grandfather   . Heart attack Neg Hx   . Stroke Neg Hx     Past Medical History:  Diagnosis Date  . Arthritis    "hands, feet" (10/31/2013)  . CHF (congestive heart failure) ([place])   . Chronic bronchitis ([place])   . High cholesterol   . Hypertension   . Obesity   . OSA on CPAP   . Pneumonia 09/2011  . Substance abuse 8/31/213   cociane "first time use"  . Tobacco abuse     Current Outpatient Prescriptions  Medication Sig Dispense Refill  . albuterol (PROVENTIL HFA;VENTOLIN HFA) 108 (90 Base) MCG/ACT inhaler Inhale 1-2 puffs into the lungs every 6 (six) hours as needed for wheezing or shortness of breath. 1 Inhaler 5  . carvedilol (COREG) 6.25 MG tablet Take 1.5 tablets (9.375 mg total) by mouth 2 (two) times daily with a meal.  90 tablet 11  . colchicine 0.6 MG tablet Take 1 tablet (0.6 mg total) by mouth daily. 14 tablet 1  . furosemide (LASIX) 40 MG tablet Take 3 tablets (120 mg total) by mouth 2 (two) times daily. 180 tablet 3  . ibuprofen (ADVIL,MOTRIN) 400 MG tablet Take 1 tablet (400 mg total) by mouth every 6 (six) hours as needed. 30 tablet 0  . losartan (COZAAR) 50 MG tablet Take 1 tablet (50 mg total) by mouth 2 (two) times daily. 60 tablet 3  . NON FORMULARY 2L of continuous oxygen     No current facility-administered medications for this encounter.     Vitals:   08/30/16 1337  BP: (!) 145/98  Pulse: 65  SpO2: 93%  Weight: (!) 318 lb 8 oz (144.5 kg)   Wt Readings from Last 3 Encounters:  08/30/16 (!) 318 lb 8 oz (144.5 kg)  08/24/16 (!) 325 lb 3.2 oz (147.5 kg)  06/07/16 (!) 310 lb 12.8 oz (141  kg)     PHYSICAL EXAM:  General: NAD, obese Neck: JVP 9-10 cm, no thyromegaly or thyroid nodule.  Lungs: Clear to auscultation bilaterally with normal respiratory effort. CV: Nondisplaced PMI.  Heart regular S1/S2, no S3/S4, no murmur.  1+ ankle edema.  No carotid bruit.  Normal pedal pulses.  Abdomen: Soft, nontender, no hepatosplenomegaly, no distention.  Skin: Intact without lesions or rashes.  Neurologic: Alert and oriented x 3.  Psych: Normal affect. Extremities: No clubbing or cyanosis.  HEENT: Normal.    ASSESSMENT & PLAN:  1.Chronic diastolic CHF with prominent RV failure: Echo today with normal EF but D-shaped septum and severely enlarged RV.  I think that RV failure has occurred in the setting of OHS/OSA with hypoxemia. He is volume overloaded on exam.  Improved, but still NYHA class III.  - He needs to wear home oxygen at all times (not wearing today).  - Make sure to wear CPAP at night.  - Increase Lasix to 120 mg bid, BMET/BNP today.  2. OHS/OSA:  Continue to wear CPAP nightly and use oxygen at all times during the day.  3. HTN:  BP high. - Increase losartan to 50 mg bid.  4. RV failure: See above discussion.  I am going to get a V/Q scan to rule out chronic PE.   [name] 09/01/2016

## 2016-09-01 NOTE — Progress Notes (Signed)
Paramedicine Encounter    Patient ID: [name], male    DOB: April 28, 1985, 31 y.o.   MRN: [id number]   Patient Care Team: Medicine, [facility] as PCP - General  Patient Active Problem List   Diagnosis Date Noted  . Morbid obesity ([place]) 06/17/2015  . Essential hypertension 04/14/2015  . Hyperkalemia 04/14/2015  . OSA (obstructive sleep apnea)   . Difficult intravenous access   . Chronic diastolic CHF (congestive heart failure), NYHA class 3 ([place]) 09/13/2014  . Cor pulmonale ([place]) 03/19/2014  . OSA treated with BiPAP     Current Outpatient Prescriptions:  .  albuterol (PROVENTIL HFA;VENTOLIN HFA) 108 (90 Base) MCG/ACT inhaler, Inhale 1-2 puffs into the lungs every 6 (six) hours as needed for wheezing or shortness of breath., Disp: 1 Inhaler, Rfl: 5 .  carvedilol (COREG) 6.25 MG tablet, Take 1.5 tablets (9.375 mg total) by mouth 2 (two) times daily with a meal., Disp: 90 tablet, Rfl: 11 .  colchicine 0.6 MG tablet, Take 1 tablet (0.6 mg total) by mouth daily., Disp: 14 tablet, Rfl: 1 .  furosemide (LASIX) 40 MG tablet, Take 3 tablets (120 mg total) by mouth 2 (two) times daily., Disp: 180 tablet, Rfl: 3 .  ibuprofen (ADVIL,MOTRIN) 400 MG tablet, Take 1 tablet (400 mg total) by mouth every 6 (six) hours as needed., Disp: 30 tablet, Rfl: 0 .  losartan (COZAAR) 50 MG tablet, Take 1 tablet (50 mg total) by mouth 2 (two) times daily., Disp: 60 tablet, Rfl: 3 .  NON FORMULARY, 2L of continuous oxygen, Disp: , Rfl:  Allergies  Allergen Reactions  . Lisinopril Cough     Social History   Social History  . Marital status: Single    Spouse name: N/A  . Number of children: 2  . Years of education: N/A   Occupational History  . N/A Not Employed   Social History Main Topics  . Smoking status: Former Smoker    Packs/day: 1.50    Years: 3.00    Types: Cigarettes    Quit date: 10/31/2013  . Smokeless tobacco: Never Used  . Alcohol use No  . Drug use: No   Comment: 10/31/2013 "might use cocaine once/month" - Has not used since 2015.   Marland Kitchen Sexual activity: No   Other Topics Concern  . Not on file   Social History Narrative  . No narrative on file    Physical Exam      Future Appointments Date Time Provider [place]  09/06/2016 11:30 AM [facility] PA/NP [facility] None  09/06/2016 12:45 PM MC-DR2 [facility]  09/06/2016 1:00 PM [facility]-NM 3 [facility]-NM [facility]  10/17/2016 10:00 AM [name], MD [facility] None   There were no vitals taken for this visit.   Mr Odem was briefly see at home today. He was unsure how to used his home oxygen condenser/compressor or portable bottle and therefore had not been using it at all. I showed him how the portable bottle connects/disconnects to the compressor station, how the regulator works and how to read the pressure gauge. I also attached the supply hose extension to his [place] so he could move throughout the house while wearing his oxygen. He showed me a connector that had broken which hooked the condenser to the compressor on top. I called [employer] who supplied the machine and they advised they would have a Architectural technologist contact Mr Willcutt to schedule a repair. Luckily this part is not necessary to use oxygen  at home or via the portable tank, but will be needed to refill the portable tank.  Time spent with patient: 30 minutes  [name], EMT 09/01/16  ACTION: Next visit planned for 1 week

## 2016-09-02 ENCOUNTER — Other Ambulatory Visit (HOSPITAL_COMMUNITY): Payer: Self-pay

## 2016-09-02 NOTE — Progress Notes (Signed)
Paramedicine Encounter    Patient ID: Douglas Madura., male    DOB: [birth year], 31 y.o.   MRN: [id number]   Patient Care Team: Medicine, [facility] as PCP - General  Patient Active Problem List   Diagnosis Date Noted  . Morbid obesity ([place]) 06/17/2015  . Essential hypertension 04/14/2015  . Hyperkalemia 04/14/2015  . OSA (obstructive sleep apnea)   . Difficult intravenous access   . Chronic diastolic CHF (congestive heart failure), NYHA class 3 ([place]) 09/13/2014  . Cor pulmonale ([place]) 03/19/2014  . OSA treated with BiPAP     Current Outpatient Prescriptions:  .  albuterol (PROVENTIL HFA;VENTOLIN HFA) 108 (90 Base) MCG/ACT inhaler, Inhale 1-2 puffs into the lungs every 6 (six) hours as needed for wheezing or shortness of breath., Disp: 1 Inhaler, Rfl: 5 .  carvedilol (COREG) 6.25 MG tablet, Take 1.5 tablets (9.375 mg total) by mouth 2 (two) times daily with a meal., Disp: 90 tablet, Rfl: 11 .  furosemide (LASIX) 40 MG tablet, Take 3 tablets (120 mg total) by mouth 2 (two) times daily., Disp: 180 tablet, Rfl: 3 .  ibuprofen (ADVIL,MOTRIN) 400 MG tablet, Take 1 tablet (400 mg total) by mouth every 6 (six) hours as needed., Disp: 30 tablet, Rfl: 0 .  losartan (COZAAR) 50 MG tablet, Take 1 tablet (50 mg total) by mouth 2 (two) times daily., Disp: 60 tablet, Rfl: 3 .  NON FORMULARY, 2L of continuous oxygen, Disp: , Rfl:  .  colchicine 0.6 MG tablet, Take 1 tablet (0.6 mg total) by mouth daily. (Patient not taking: Reported on 09/02/2016), Disp: 14 tablet, Rfl: 1 Allergies  Allergen Reactions  . Lisinopril Cough     Social History   Social History  . Marital status: Single    Spouse name: N/A  . Number of children: 2  . Years of education: N/A   Occupational History  . N/A Not Employed   Social History Main Topics  . Smoking status: Former Smoker    Packs/day: 1.50    Years: 3.00    Types: Cigarettes    Quit date: 10/31/2013  . Smokeless tobacco: Never  Used  . Alcohol use No  . Drug use: No     Comment: 10/31/2013 "might use cocaine once/month" - Has not used since 2015.   Marland Kitchen Sexual activity: No   Other Topics Concern  . Not on file   Social History Narrative  . No narrative on file    Physical Exam  Constitutional: He is oriented to person, place, and time.  Cardiovascular: Normal rate and regular rhythm.   Pulmonary/Chest: Effort normal and breath sounds normal.  Abdominal: Soft.  Musculoskeletal: Normal range of motion. He exhibits edema.  Neurological: He is alert and oriented to person, place, and time.  Skin: Skin is warm and dry.        Future Appointments Date Time Provider [place]  09/06/2016 11:30 AM [facility] PA/NP [facility] None  09/06/2016 12:45 PM MC-DR2 MC[facility]DG [facility]  09/06/2016 1:00 PM MC-NM 3 MC-NM [facility]  10/17/2016 10:00 AM [name], MD [facility] None   BP 120/90 (BP Location: Left Arm, Patient Position: Sitting, Cuff Size: Large)   Pulse 80   Resp 16   Wt (!) 309 lb 9.6 oz (140.4 kg)   SpO2 95%   BMI 53.14 kg/m  Weight yesterday- Unable to obtain Last visit weight- 318.5 lbs  Douglas [name] was seen at home today upon his request. He was concerned about how  to take his medication because his Losartan bottle instructed to take one per day which did not match his pillbox. I also found that he still had old losartan that were 25 mg which was causing him further confusion. I explained that his dosage had changed and his pillbox was correct. Since this change was made I contacted his pharmacy to ensure they had the new prescription and they did. While there Douglas [name] asked me to show him how to use his home oxygen concentrator again. I went through step by step how the machine is connected to his [place] and how the flow rate is adjusted. He then asked about using the portable bottle in the house instead of the concentrator but I explained that since his compressor was not working at present he should save  his portable tank for when he leaves the house. He then advised that he had another tank in the living room and wanted to know how to see if it was full. This prompted a training session on how to use an oxygen tank regulator. I showed him multiple times and had him practice with me. Medications were verified and his pillbox was checked.   Time spent with patient: 35 minutes  [name], EMT 09/02/16  ACTION: Home visit completed Next visit planned for 1 week

## 2016-09-05 ENCOUNTER — Other Ambulatory Visit (HOSPITAL_COMMUNITY): Payer: Self-pay

## 2016-09-05 NOTE — Progress Notes (Signed)
Paramedicine Encounter    Patient ID: Douglas [name]., male    DOB: [birth year], 31 y.o.   MRN: [id number]   Patient Care Team: Medicine, [facility] as PCP - General  Patient Active Problem List   Diagnosis Date Noted  . Morbid obesity ([place]) 06/17/2015  . Essential hypertension 04/14/2015  . Hyperkalemia 04/14/2015  . OSA (obstructive sleep apnea)   . Difficult intravenous access   . Chronic diastolic CHF (congestive heart failure), NYHA class 3 ([place]) 09/13/2014  . Cor pulmonale ([place]) 03/19/2014  . OSA treated with BiPAP     Current Outpatient Prescriptions:  .  albuterol (PROVENTIL HFA;VENTOLIN HFA) 108 (90 Base) MCG/ACT inhaler, Inhale 1-2 puffs into the lungs every 6 (six) hours as needed for wheezing or shortness of breath., Disp: 1 Inhaler, Rfl: 5 .  carvedilol (COREG) 6.25 MG tablet, Take 1.5 tablets (9.375 mg total) by mouth 2 (two) times daily with a meal., Disp: 90 tablet, Rfl: 11 .  furosemide (LASIX) 40 MG tablet, Take 3 tablets (120 mg total) by mouth 2 (two) times daily., Disp: 180 tablet, Rfl: 3 .  losartan (COZAAR) 50 MG tablet, Take 1 tablet (50 mg total) by mouth 2 (two) times daily., Disp: 60 tablet, Rfl: 3 .  NON FORMULARY, 2L of continuous oxygen, Disp: , Rfl:  .  colchicine 0.6 MG tablet, Take 1 tablet (0.6 mg total) by mouth daily. (Patient not taking: Reported on 09/02/2016), Disp: 14 tablet, Rfl: 1 .  ibuprofen (ADVIL,MOTRIN) 400 MG tablet, Take 1 tablet (400 mg total) by mouth every 6 (six) hours as needed. (Patient not taking: Reported on 09/05/2016), Disp: 30 tablet, Rfl: 0 Allergies  Allergen Reactions  . Lisinopril Cough     Social History   Social History  . Marital status: Single    Spouse name: N/A  . Number of children: 2  . Years of education: N/A   Occupational History  . N/A Not Employed   Social History Main Topics  . Smoking status: Former Smoker    Packs/day: 1.50    Years: 3.00    Types: Cigarettes    Quit  date: 10/31/2013  . Smokeless tobacco: Never Used  . Alcohol use No  . Drug use: No     Comment: 10/31/2013 "might use cocaine once/month" - Has not used since 2015.   Marland Kitchen Sexual activity: No   Other Topics Concern  . Not on file   Social History Narrative  . No narrative on file    Physical Exam  Constitutional: He is oriented to person, place, and time.  Cardiovascular: Normal rate.   Pulmonary/Chest: Effort normal and breath sounds normal. No respiratory distress. He has no wheezes. He has no rales.  Abdominal: Soft.  Musculoskeletal: Normal range of motion. He exhibits no edema.  Neurological: He is alert and oriented to person, place, and time.  Skin: Skin is warm and dry.  Psychiatric: He has a normal mood and affect.        Future Appointments Date Time Provider [place]  09/06/2016 11:30 AM [facility] PA/NP [facility] None  09/06/2016 12:45 PM MC-DR2 [facility]DG [facility]  09/06/2016 1:00 PM MC-NM 3 MC-NM [facility]  10/17/2016 10:00 AM [name], MD [facility]UL[facility]ARE None   BP (!) 130/96 (BP Location: Left Arm, Patient Position: Sitting, Cuff Size: Large)   Pulse 78   Resp 16   Wt (!) 305 lb 6.4 oz (138.5 kg)   SpO2 95%   BMI 52.42 kg/m  Weight  yesterday- Did not weigh  Last visit weight- 309.6 lbs  Douglas [name] was seen at home today and reports feeling well. He stated he celebrated his birthday yesterday and ate a [place] hotdog and did not weigh. I informed him that he should avoid processed foods because they usually contain high amounts of sodium. He stated he understood and had otherwise eaten well. He stated the repairs had been made to his oxygen compressor and he is getting the hang of using his portable oxygen tank after using it over the weekend. He denied SOB, headache, dizziness or orthopnea. Medications were verified and it was noted that he lost a bottle of furosemide so [facility] was contacted and able to fill a new Rx for him. The medication was picked up and  returned and his pillbox was refilled.  Time spent with patient: 35 minutes  Douglas [name], EMT 09/05/16  ACTION: Home visit completed Next visit planned for 1 week

## 2016-09-06 ENCOUNTER — Ambulatory Visit (HOSPITAL_COMMUNITY)
Admission: RE | Admit: 2016-09-06 | Discharge: 2016-09-06 | Disposition: A | Discharge status: Home / Self Care | Payer: Medicaid Other | Source: Ambulatory Visit | Attending: Cardiology | Admitting: Cardiology

## 2016-09-06 ENCOUNTER — Encounter (HOSPITAL_COMMUNITY): Payer: Self-pay

## 2016-09-06 VITALS — BP 132/78 | HR 78 | Wt 305.0 lb

## 2016-09-06 DIAGNOSIS — I5032 Chronic diastolic (congestive) heart failure: Secondary | ICD-10-CM

## 2016-09-06 DIAGNOSIS — I2781 Cor pulmonale (chronic): Secondary | ICD-10-CM | POA: Diagnosis not present

## 2016-09-06 DIAGNOSIS — G4733 Obstructive sleep apnea (adult) (pediatric): Secondary | ICD-10-CM | POA: Diagnosis not present

## 2016-09-06 DIAGNOSIS — I517 Cardiomegaly: Secondary | ICD-10-CM | POA: Insufficient documentation

## 2016-09-06 LAB — BASIC METABOLIC PANEL
ANION GAP: 9 (ref 5–15)
BUN: 11 mg/dL (ref 6–20)
CHLORIDE: 91 mmol/L — AB (ref 101–111)
CO2: 40 mmol/L — AB (ref 22–32)
Calcium: 8.4 mg/dL — ABNORMAL LOW (ref 8.9–10.3)
Creatinine, Ser: 1.04 mg/dL (ref 0.61–1.24)
GFR calc non Af Amer: >60 mL/min (ref >60)
Glucose, Bld: 111 mg/dL — ABNORMAL HIGH (ref 65–99)
POTASSIUM: 4.9 mmol/L (ref 3.5–5.1)
SODIUM: 140 mmol/L (ref 135–145)

## 2016-09-06 MED ORDER — TECHNETIUM TO 99M ALBUMIN AGGREGATED
4.0000 | Freq: Once | INTRAVENOUS | Status: AC | PRN
  Administered 2016-09-06: 4 via INTRAVENOUS

## 2016-09-06 MED ORDER — TECHNETIUM TC 99M DIETHYLENETRIAME-PENTAACETIC ACID: 32.0000 | Freq: Once | INTRAVENOUS | Status: DC | PRN

## 2016-09-06 NOTE — Progress Notes (Signed)
Patient ID: Douglas Madura., male   DOB: 07-22-1985, 31 y.o.   MRN: [id number]    Advanced Heart Failure Clinic Note   PCP: none.  Primary Cardiologist: Dr Douglas [name]  Pulmonary  HPI: [name] is a 31 yo with OHS/OSA, morbid obesity, HTN, diastolic CHF with prominent RV failure.   Admitted in August 2016  to [facility] with volume overload. Diuresed with lasix drip and diamox. Transitioned to lasix 80 mg twice a day. Discharge weight 257 pounds.   Recent admit to [facility] 3/21 through 04/28/15. Admitted with respiratory distress. Required short term intubation. Apparently had not been wearing BiPap. Discharge weight was 259 pounds.   He returns today for HF follow up. Followed by paramedicine. He was having some trouble with his oxygen, our paramedic educated him about how to use it. Now using it as he should. He says he feels so much better, SOB has improved. His weight is down 25 pounds. He continues to eat some high sodium foods, drinking more than 2L a day. Drinks mostly juice. Taking all medications, he brought his pills and pill box today.     - ECHO 03/20/2014 EF 60-65% - ECHO 8/18: EF 55-60%, grade II diastolic dydsfunction, D-shaped septum suggesting RV pressure/volume overload, severe RV dilation with mild-moderately decreased RV systolic function, unable to estimate PA pressure.   Labs 09/21/2014: K 3.9 Creatinine 1.03  Labs 11/26/2014: K 4.2 Creatinine 1.08  Labs 12/30/2014: K 4.7 Creatinine 1.05  Labs 04/26/2015: K 4.0 Creatinine 0.94  Labs 10/17: K 4.1, creatinine 1.17 Labs 8/18: K 4.4, creatinine 1.26  ROS: All systems negative except as listed in HPI, PMH and Problem List.  SH:  Social History   Social History  . Marital status: Single    Spouse name: N/A  . Number of children: 2  . Years of education: N/A   Occupational History  . N/A Not Employed   Social History Main Topics  . Smoking status: Former Smoker    Packs/day: 1.50    Years: 3.00    Types: Cigarettes   Quit date: 10/31/2013  . Smokeless tobacco: Never Used  . Alcohol use No  . Drug use: No     Comment: 10/31/2013 "might use cocaine once/month" - Has not used since 2015.   Marland Kitchen Sexual activity: No   Other Topics Concern  . Not on file   Social History Narrative  . No narrative on file    FH:  Family History  Problem Relation Age of Onset  . Heart disease Mother        Pacemaker  . Hypertension Father   . Cancer Paternal Grandfather   . Heart attack Neg Hx   . Stroke Neg Hx     Past Medical History:  Diagnosis Date  . Arthritis    "hands, feet" (10/31/2013)  . CHF (congestive heart failure) ([place])   . Chronic bronchitis ([place])   . High cholesterol   . Hypertension   . Obesity   . OSA on CPAP   . Pneumonia 09/2011  . Substance abuse 8/31/213   cociane "first time use"  . Tobacco abuse     Current Outpatient Prescriptions  Medication Sig Dispense Refill  . albuterol (PROVENTIL HFA;VENTOLIN HFA) 108 (90 Base) MCG/ACT inhaler Inhale 1-2 puffs into the lungs every 6 (six) hours as needed for wheezing or shortness of breath. 1 Inhaler 5  . carvedilol (COREG) 6.25 MG tablet Take 1.5 tablets (9.375 mg total) by mouth 2 (two) times  daily with a meal. 90 tablet 11  . furosemide (LASIX) 40 MG tablet Take 3 tablets (120 mg total) by mouth 2 (two) times daily. 180 tablet 3  . losartan (COZAAR) 50 MG tablet Take 1 tablet (50 mg total) by mouth 2 (two) times daily. 60 tablet 3  . NON FORMULARY 2L of continuous oxygen    . colchicine 0.6 MG tablet Take 1 tablet (0.6 mg total) by mouth daily. (Patient not taking: Reported on 09/02/2016) 14 tablet 1  . ibuprofen (ADVIL,MOTRIN) 400 MG tablet Take 1 tablet (400 mg total) by mouth every 6 (six) hours as needed. (Patient not taking: Reported on 09/05/2016) 30 tablet 0   No current facility-administered medications for this encounter.     Vitals:   09/06/16 1136  BP: 132/78  Pulse: 78  SpO2: 94%  Weight: (!) 305 lb (138.3 kg)   Wt Readings  from Last 3 Encounters:  09/06/16 (!) 305 lb (138.3 kg)  09/05/16 (!) 305 lb 6.4 oz (138.5 kg)  09/02/16 (!) 309 lb 9.6 oz (140.4 kg)     PHYSICAL EXAM:  General: Well appearing. No resp difficulty. HEENT: Normal Neck: Supple. Thick. JVP 5-6. Carotids 2+ bilat; no bruits. No thyromegaly or nodule noted. Cor: PMI nondisplaced. RRR, No M/G/R noted Lungs: CTAB, normal effort. On 2L oxygen Abdomen: Soft, non-tender, non-distended, no HSM. No bruits or masses. +BS  Extremities: No cyanosis, clubbing, rash, R and LLE no edema.  Neuro: Alert & orientedx3, cranial nerves grossly intact. moves all 4 extremities w/o difficulty. Affect pleasant    ASSESSMENT & PLAN:  1.Chronic diastolic CHF with prominent RV failure: Echo 08/30/16 with D shaped septum and severely enlarged RV. RV failure in the setting of OSA and OHS.  - NYHA III - Volume status stable on exam. Continue Lasix 120 mg BID.  - BMET today.  - Continue losartan and Coreg.   2. OHS/OSA:  - Compliant with oxygen use now. Feels much better, I think since he is feeling so much better he will be more compliant.  - Compliant with CPAP hs.   3. HTN:  BP slightly elevated. Will ask paramedicine to check his BP next week and we can make adjustments if needed.   4. RV failure: VQ scan today to rule out chronic PE.  - Continue to wear oxygen at all times.   Continue paramedicine. Follow up in 2 weeks, then likely can follow up less frequently.   Douglas [name] 09/06/2016

## 2016-09-06 NOTE — Patient Instructions (Signed)
Labs today  Your physician recommends that you schedule a follow-up appointment in: 2 weeks  

## 2016-09-07 ENCOUNTER — Encounter (HOSPITAL_COMMUNITY): Payer: Medicaid Other

## 2016-09-12 ENCOUNTER — Other Ambulatory Visit (HOSPITAL_COMMUNITY): Payer: Self-pay

## 2016-09-12 NOTE — Progress Notes (Signed)
Paramedicine Encounter    Patient ID: [name], male    DOB: May 29, 1985, 31 y.o.   MRN: [id number]   Patient Care Team: Medicine, [facility] as PCP - General  Patient Active Problem List   Diagnosis Date Noted  . Morbid obesity ([place]) 06/17/2015  . Essential hypertension 04/14/2015  . Hyperkalemia 04/14/2015  . OSA (obstructive sleep apnea)   . Difficult intravenous access   . Chronic diastolic CHF (congestive heart failure), NYHA class 3 ([place]) 09/13/2014  . Cor pulmonale ([place]) 03/19/2014  . OSA treated with BiPAP     Current Outpatient Prescriptions:  .  albuterol (PROVENTIL HFA;VENTOLIN HFA) 108 (90 Base) MCG/ACT inhaler, Inhale 1-2 puffs into the lungs every 6 (six) hours as needed for wheezing or shortness of breath., Disp: 1 Inhaler, Rfl: 5 .  carvedilol (COREG) 6.25 MG tablet, Take 1.5 tablets (9.375 mg total) by mouth 2 (two) times daily with a meal., Disp: 90 tablet, Rfl: 11 .  furosemide (LASIX) 40 MG tablet, Take 3 tablets (120 mg total) by mouth 2 (two) times daily., Disp: 180 tablet, Rfl: 3 .  losartan (COZAAR) 50 MG tablet, Take 1 tablet (50 mg total) by mouth 2 (two) times daily., Disp: 60 tablet, Rfl: 3 .  NON FORMULARY, 2L of continuous oxygen, Disp: , Rfl:  .  colchicine 0.6 MG tablet, Take 1 tablet (0.6 mg total) by mouth daily. (Patient not taking: Reported on 09/02/2016), Disp: 14 tablet, Rfl: 1 .  ibuprofen (ADVIL,MOTRIN) 400 MG tablet, Take 1 tablet (400 mg total) by mouth every 6 (six) hours as needed. (Patient not taking: Reported on 09/05/2016), Disp: 30 tablet, Rfl: 0 Allergies  Allergen Reactions  . Lisinopril Cough     Social History   Social History  . Marital status: Single    Spouse name: N/A  . Number of children: 2  . Years of education: N/A   Occupational History  . N/A Not Employed   Social History Main Topics  . Smoking status: Former Smoker    Packs/day: 1.50    Years: 3.00    Types: Cigarettes    Quit  date: 10/31/2013  . Smokeless tobacco: Never Used  . Alcohol use No  . Drug use: No     Comment: 10/31/2013 "might use cocaine once/month" - Has not used since 2015.   Marland Kitchen Sexual activity: No   Other Topics Concern  . Not on file   Social History Narrative  . No narrative on file    Physical Exam  Constitutional: He is oriented to person, place, and time.  Cardiovascular: Normal rate and regular rhythm.   Pulmonary/Chest: Effort normal and breath sounds normal. No respiratory distress. He has no wheezes. He has no rales.  Abdominal: Soft.  Musculoskeletal: Normal range of motion. He exhibits no edema.  Neurological: He is alert and oriented to person, place, and time.  Skin: Skin is warm and dry.        Future Appointments Date Time Provider [place]  09/20/2016 2:00 PM [facility] PA/NP [facility] None  10/17/2016 10:00 AM [name], MD [facility] None   BP (!) 150/108 (BP Location: Left Arm, Patient Position: Sitting, Cuff Size: Large)   Pulse 71   Resp 16   Wt (!) 302 lb 6.4 oz (137.2 kg)   SpO2 96%   BMI 51.91 kg/m  Weight yesterday- 305 lbs Last visit weight- 305 lbs  Mr Ohm was seen at home today and reports feeling well. He  has been compliant with his medications except he had not taken them today because he was waiting for me to make sure he took them correctly. His medications were verified and his pillbox was refilled. He stated he is concerned about what he is supposed to eat/how to cook and asked if I would be willing to meet him at the grocery store sometime and show him how to read labels and buy food that is heart healthy.   Time spent with patient: 53 minutes  [name], EMT 09/12/16  ACTION: Home visit completed Next visit planned for 1 week

## 2016-09-19 ENCOUNTER — Other Ambulatory Visit (HOSPITAL_COMMUNITY): Payer: Self-pay

## 2016-09-19 NOTE — Progress Notes (Signed)
Paramedicine Encounter    Patient ID: Douglas [name]., male    DOB: 12/12/1985, 31 y.o.   MRN: [id number]   Patient Care Team: Medicine, [facility] as PCP - General  Patient Active Problem List   Diagnosis Date Noted  . Morbid obesity ([place]) 06/17/2015  . Essential hypertension 04/14/2015  . Hyperkalemia 04/14/2015  . OSA (obstructive sleep apnea)   . Difficult intravenous access   . Chronic diastolic CHF (congestive heart failure), NYHA class 3 ([place]) 09/13/2014  . Cor pulmonale ([place]) 03/19/2014  . OSA treated with BiPAP     Current Outpatient Prescriptions:  .  albuterol (PROVENTIL HFA;VENTOLIN HFA) 108 (90 Base) MCG/ACT inhaler, Inhale 1-2 puffs into the lungs every 6 (six) hours as needed for wheezing or shortness of breath., Disp: 1 Inhaler, Rfl: 5 .  carvedilol (COREG) 6.25 MG tablet, Take 1.5 tablets (9.375 mg total) by mouth 2 (two) times daily with a meal., Disp: 90 tablet, Rfl: 11 .  furosemide (LASIX) 40 MG tablet, Take 3 tablets (120 mg total) by mouth 2 (two) times daily., Disp: 180 tablet, Rfl: 3 .  losartan (COZAAR) 50 MG tablet, Take 1 tablet (50 mg total) by mouth 2 (two) times daily., Disp: 60 tablet, Rfl: 3 .  NON FORMULARY, 2L of continuous oxygen, Disp: , Rfl:  .  hydrALAZINE (APRESOLINE) 25 MG tablet, Take 0.5 tablets (12.5 mg total) by mouth 3 (three) times daily., Disp: 135 tablet, Rfl: 6 Allergies  Allergen Reactions  . Lisinopril Cough     Social History   Social History  . Marital status: Single    Spouse name: N/A  . Number of children: 2  . Years of education: N/A   Occupational History  . N/A Not Employed   Social History Main Topics  . Smoking status: Former Smoker    Packs/day: 1.50    Years: 3.00    Types: Cigarettes    Quit date: 10/31/2013  . Smokeless tobacco: Never Used  . Alcohol use No  . Drug use: No     Comment: 10/31/2013 "might use cocaine once/month" - Has not used since 2015.   Marland Kitchen Sexual activity: No    Other Topics Concern  . Not on file   Social History Narrative  . No narrative on file    Physical Exam  Constitutional: He is oriented to person, place, and time.  Cardiovascular: Normal rate and regular rhythm.   Pulmonary/Chest: Effort normal and breath sounds normal. No respiratory distress. He has no wheezes. He has no rales.  Abdominal: Soft.  Musculoskeletal: Normal range of motion. He exhibits no edema.  Neurological: He is alert and oriented to person, place, and time.  Skin: Skin is warm and dry.  Psychiatric: He has a normal mood and affect.        Future Appointments Date Time Provider [place]  10/17/2016 10:00 AM [name], MD [facility] None  11/21/2016 2:30 PM [facility] PA/NP [facility] None   BP 130/84 (BP Location: Left Arm, Patient Position: Sitting, Cuff Size: Large)   Pulse 90   Resp 16   Wt 298 lb 3.2 oz (135.3 kg)   SpO2 96%   BMI 51.19 kg/m  Weight yesterday- Did not weigh Last visit weight- 302 lbs  Douglas [name] was see at home today and reports feeling well. He denied SOB, dizziness, headache or orthopnea. He said he had been out of the house a few times in the last 24 hours without his oxygen  but said he did not become SOB during those times. I explained that the importance of the oxygen at all times is to prevent his heart from getting weaker and even if he is not feeling SOB, he should still be using the oxygen until the physician instructs him otherwise. His medications were verified and his pillbox was refilled. He did not obtain his weight yesterday because he stated he is unable to see the numbers while standing on the scale. I will look into getting him a scale that reads out the numbers to him for next week.  Time spent with patient: 30 minutes  [name], EMT 09/20/16  ACTION: Home visit completed Next visit planned for 1 week

## 2016-09-20 ENCOUNTER — Ambulatory Visit (HOSPITAL_COMMUNITY)
Admission: RE | Admit: 2016-09-20 | Discharge: 2016-09-20 | Disposition: A | Discharge status: Home / Self Care | Payer: Medicaid Other | Source: Ambulatory Visit | Attending: Internal Medicine | Admitting: Internal Medicine

## 2016-09-20 VITALS — BP 142/90 | HR 79 | Wt 299.8 lb

## 2016-09-20 DIAGNOSIS — Z87891 Personal history of nicotine dependence: Secondary | ICD-10-CM | POA: Diagnosis not present

## 2016-09-20 DIAGNOSIS — G4733 Obstructive sleep apnea (adult) (pediatric): Secondary | ICD-10-CM | POA: Insufficient documentation

## 2016-09-20 DIAGNOSIS — J42 Unspecified chronic bronchitis: Secondary | ICD-10-CM | POA: Diagnosis not present

## 2016-09-20 DIAGNOSIS — I5032 Chronic diastolic (congestive) heart failure: Secondary | ICD-10-CM | POA: Insufficient documentation

## 2016-09-20 DIAGNOSIS — I10 Essential (primary) hypertension: Secondary | ICD-10-CM | POA: Diagnosis not present

## 2016-09-20 DIAGNOSIS — I110 Hypertensive heart disease with heart failure: Secondary | ICD-10-CM | POA: Diagnosis not present

## 2016-09-20 DIAGNOSIS — I2781 Cor pulmonale (chronic): Secondary | ICD-10-CM | POA: Diagnosis not present

## 2016-09-20 DIAGNOSIS — E7800 Pure hypercholesterolemia, unspecified: Secondary | ICD-10-CM | POA: Diagnosis not present

## 2016-09-20 MED ORDER — HYDRALAZINE HCL 25 MG PO TABS: 12.5000 mg | ORAL_TABLET | Freq: Three times a day (TID) | ORAL | 6 refills | Status: DC
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
## 2016-09-20 NOTE — Patient Instructions (Signed)
START Hydralazine 12.5 mg (1/2 tablet) three times daily.  Follow up 2 months. Take all medication as prescribed the day of your appointment. Bring all medications with you to your appointment.  Do the following things EVERYDAY: 1) Weigh yourself in the morning before breakfast. Write it down and keep it in a log. 2) Take your medicines as prescribed 3) Eat low salt foods-Limit salt (sodium) to 2000 mg per day.  4) Stay as active as you can everyday 5) Limit all fluids for the day to less than 2 liters

## 2016-09-20 NOTE — Progress Notes (Signed)
Advanced Heart Failure Medication Review by a Pharmacist  Does the patient  feel that his/her medications are working for him/her?  yes  Has the patient been experiencing any side effects to the medications prescribed?  no  Does the patient measure his/her own blood pressure or blood glucose at home?  no   Does the patient have any problems obtaining medications due to transportation or finances?   No - [place] Medicaid  Understanding of regimen: fair Understanding of indications: fair Potential of compliance: good Patient understands to avoid NSAIDs. Patient understands to avoid decongestants.  Issues to address at subsequent visits: None   Pharmacist comments:  Douglas [name] is a 31 yo M presenting without a medication list. He reports good compliance with his medications and did not have any specific medication-related questions or concerns for me at this time.   [name], PharmD, BCPS, CPP Clinical Pharmacist Pager: [phone] Phone: [phone] 09/20/2016 2:34 PM      Time with patient: 10 minutes Preparation and documentation time: 2 minutes Total time: 12 minutes

## 2016-09-28 ENCOUNTER — Other Ambulatory Visit (HOSPITAL_COMMUNITY): Payer: Self-pay

## 2016-09-28 ENCOUNTER — Other Ambulatory Visit: Payer: Self-pay | Admitting: Pulmonary Disease

## 2016-09-28 NOTE — Progress Notes (Signed)
Paramedicine Encounter    Patient ID: Douglas Madura., male    DOB: 03-29-1985, 31 y.o.   MRN: [id number]   Patient Care Team: Medicine, [facility] as PCP - General  Patient Active Problem List   Diagnosis Date Noted  . Morbid obesity ([place]) 06/17/2015  . Essential hypertension 04/14/2015  . Hyperkalemia 04/14/2015  . OSA (obstructive sleep apnea)   . Difficult intravenous access   . Chronic diastolic CHF (congestive heart failure), NYHA class 3 ([place]) 09/13/2014  . Cor pulmonale ([place]) 03/19/2014  . OSA treated with BiPAP     Current Outpatient Prescriptions:  .  carvedilol (COREG) 6.25 MG tablet, Take 1.5 tablets (9.375 mg total) by mouth 2 (two) times daily with a meal., Disp: 90 tablet, Rfl: 11 .  furosemide (LASIX) 40 MG tablet, Take 3 tablets (120 mg total) by mouth 2 (two) times daily., Disp: 180 tablet, Rfl: 3 .  losartan (COZAAR) 50 MG tablet, Take 1 tablet (50 mg total) by mouth 2 (two) times daily., Disp: 60 tablet, Rfl: 3 .  NON FORMULARY, 2L of continuous oxygen, Disp: , Rfl:  .  hydrALAZINE (APRESOLINE) 25 MG tablet, Take 0.5 tablets (12.5 mg total) by mouth 3 (three) times daily. (Patient not taking: Reported on 09/28/2016), Disp: 135 tablet, Rfl: 6 .  PROVENTIL HFA 108 (90 Base) MCG/ACT inhaler, INHALE 1 TO 2 PUFFS INTO THE LUNGS EVERY6 HOURS AS NEEDED FOR WHEEZING OR SHORTNESS OF BREATH, Disp: 6.7 g, Rfl: 5 Allergies  Allergen Reactions  . Lisinopril Cough     Social History   Social History  . Marital status: Single    Spouse name: N/A  . Number of children: 2  . Years of education: N/A   Occupational History  . N/A Not Employed   Social History Main Topics  . Smoking status: Former Smoker    Packs/day: 1.50    Years: 3.00    Types: Cigarettes    Quit date: 10/31/2013  . Smokeless tobacco: Never Used  . Alcohol use No  . Drug use: No     Comment: 10/31/2013 "might use cocaine once/month" - Has not used since 2015.   Marland Kitchen Sexual  activity: No   Other Topics Concern  . Not on file   Social History Narrative  . No narrative on file    Physical Exam  Constitutional: He is oriented to person, place, and time.  Cardiovascular: Normal rate and regular rhythm.   Pulmonary/Chest: Effort normal and breath sounds normal. No respiratory distress. He has no wheezes. He has no rales.  Abdominal: Soft.  Musculoskeletal: Normal range of motion. He exhibits edema.  Neurological: He is alert and oriented to person, place, and time.  Skin: Skin is warm and dry.  Psychiatric: He has a normal mood and affect.        Future Appointments Date Time Provider [place]  10/17/2016 10:00 AM [name], MD [facility] None  11/21/2016 2:30 PM [facility] PA/NP [facility] None   BP (!) 140/100 (BP Location: Left Arm, Patient Position: Sitting, Cuff Size: Normal)   Pulse 78   Resp 18   Wt 297 lb 6.4 oz (134.9 kg)   SpO2 92%   BMI 51.05 kg/m  Weight yesterday- Unable to weigh Last visit weight- 298.2 lbs  Douglas [name] was seen at home today and reports feeling well. He denied SOB, headache, dizziness or orthopnea. He reports being compliant with his medications with the exception of hydralazine which has not been  delivered. I spoke with the pharmacy who stated they have been unable to reach him to schedule delivery. They are now planning on delivery sometime between 13:00 and 15:00 tomorrow and Douglas [name] stated he will be home. He is still unable to weigh himself without someone there because he cannot see the scale while standing on it. We have ordered a new talking scale but it has not been delivered yet. Medications were verified and his pillbox was refilled.  Time spent with patient: 22 minutes  [name], EMT 09/29/16  ACTION: Home visit completed Next visit planned for 1 week

## 2016-10-04 ENCOUNTER — Telehealth (HOSPITAL_COMMUNITY): Payer: Self-pay | Admitting: *Deleted

## 2016-10-04 ENCOUNTER — Other Ambulatory Visit (HOSPITAL_COMMUNITY): Payer: Self-pay

## 2016-10-04 MED ORDER — TORSEMIDE 20 MG PO TABS: 60.0000 mg | ORAL_TABLET | Freq: Two times a day (BID) | ORAL | 3 refills | Status: DC

## 2016-10-04 NOTE — Progress Notes (Signed)
Paramedicine Encounter    Patient ID: Douglas [name]., male    DOB: 04-30-1985, 31 y.o.   MRN: [id number]   Patient Care Team: Medicine, [facility] as PCP - General  Patient Active Problem List   Diagnosis Date Noted  . Morbid obesity ([place]) 06/17/2015  . Essential hypertension 04/14/2015  . Hyperkalemia 04/14/2015  . OSA (obstructive sleep apnea)   . Difficult intravenous access   . Chronic diastolic CHF (congestive heart failure), NYHA class 3 ([place]) 09/13/2014  . Cor pulmonale ([place]) 03/19/2014  . OSA treated with BiPAP     Current Outpatient Prescriptions:  .  carvedilol (COREG) 6.25 MG tablet, Take 1.5 tablets (9.375 mg total) by mouth 2 (two) times daily with a meal., Disp: 90 tablet, Rfl: 11 .  furosemide (LASIX) 40 MG tablet, Take 3 tablets (120 mg total) by mouth 2 (two) times daily., Disp: 180 tablet, Rfl: 3 .  hydrALAZINE (APRESOLINE) 25 MG tablet, Take 0.5 tablets (12.5 mg total) by mouth 3 (three) times daily., Disp: 135 tablet, Rfl: 6 .  losartan (COZAAR) 50 MG tablet, Take 1 tablet (50 mg total) by mouth 2 (two) times daily., Disp: 60 tablet, Rfl: 3 .  NON FORMULARY, 2L of continuous oxygen, Disp: , Rfl:  .  PROVENTIL HFA 108 (90 Base) MCG/ACT inhaler, INHALE 1 TO 2 PUFFS INTO THE LUNGS EVERY6 HOURS AS NEEDED FOR WHEEZING OR SHORTNESS OF BREATH, Disp: 6.7 g, Rfl: 5 Allergies  Allergen Reactions  . Lisinopril Cough     Social History   Social History  . Marital status: Single    Spouse name: N/A  . Number of children: 2  . Years of education: N/A   Occupational History  . N/A Not Employed   Social History Main Topics  . Smoking status: Former Smoker    Packs/day: 1.50    Years: 3.00    Types: Cigarettes    Quit date: 10/31/2013  . Smokeless tobacco: Never Used  . Alcohol use No  . Drug use: No     Comment: 10/31/2013 "might use cocaine once/month" - Has not used since 2015.   Marland Kitchen Sexual activity: No   Other Topics Concern  . Not on  file   Social History Narrative  . No narrative on file    Physical Exam  Constitutional: He is oriented to person, place, and time.  Cardiovascular: Normal rate and regular rhythm.   Pulmonary/Chest: Effort normal and breath sounds normal. No respiratory distress. He has no wheezes. He has no rales.  Abdominal: Soft.  Musculoskeletal: Normal range of motion. He exhibits edema.  Neurological: He is alert and oriented to person, place, and time.  Skin: Skin is warm and dry.        Future Appointments Date Time Provider [place]  10/17/2016 10:00 AM [name], MD [facility] None  11/21/2016 2:30 PM [facility] PA/NP [facility] None   BP 130/86 (BP Location: Left Arm, Patient Position: Sitting, Cuff Size: Large)   Pulse 76   Resp 16   Wt (!) 301 lb 6.4 oz (136.7 kg)   SpO2 96%   BMI 51.74 kg/m  Weight yesterday- Unable to weigh Last visit weight- 297.4 lbs  Douglas [name] was seen at home today and reports feeling generally well. He denied SOB, dizziness, headache and orthopnea. He has gained 4 pounds since our last visit and was exhibiting lower extremity edema. He was unable to advise his weight from yesterday because he still does not have  the scale with an audio feature. I contacted the clinic who advised they want to change his diuretic to torsemide 60 mg BID. He was instructed to take his evening dose of furosemide and I would bring his torsemide from the pharmacy and refill his box later today. All furosemide was removed from his pillbox with the exception of tonight's does.   Time spent with patient: 33 minutes   Douglas [name], EMT 10/04/16  ACTION: Home visit completed Next visit planned for 1 week

## 2016-10-04 NOTE — Telephone Encounter (Signed)
Advanced Heart Failure Triage Encounter  Patient Name: Douglas [name].   Date of Call: 10/04/2016  Problem:  Douglas [name] with paramedicine called stating pts weight was up 4lbs in 1 week he noticed fluid in both legs. Patient currently taking Lasix 120mg  bid. No metolazone or K.   Plan:  Per Junie Panning pt should stop Lasix and start Torsemide 60mg  bid and come for labs Monday weather permitting. Pt aware and agreeable.   Harvie Junior, CMA

## 2016-10-05 ENCOUNTER — Other Ambulatory Visit (HOSPITAL_COMMUNITY): Payer: Self-pay
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
## 2016-10-10 ENCOUNTER — Other Ambulatory Visit (HOSPITAL_COMMUNITY): Payer: Self-pay | Admitting: *Deleted

## 2016-10-10 ENCOUNTER — Inpatient Hospital Stay (HOSPITAL_COMMUNITY): Admission: RE | Admit: 2016-10-10 | Payer: Medicaid Other | Source: Ambulatory Visit

## 2016-10-10 DIAGNOSIS — I5032 Chronic diastolic (congestive) heart failure: Secondary | ICD-10-CM

## 2016-10-11 ENCOUNTER — Other Ambulatory Visit (HOSPITAL_COMMUNITY): Payer: Medicaid Other

## 2016-10-11 ENCOUNTER — Other Ambulatory Visit (HOSPITAL_COMMUNITY): Payer: Self-pay
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
## 2016-10-12 ENCOUNTER — Ambulatory Visit (HOSPITAL_COMMUNITY)
Admission: RE | Admit: 2016-10-12 | Discharge: 2016-10-12 | Disposition: A | Discharge status: Home / Self Care | Payer: Medicaid Other | Source: Ambulatory Visit | Attending: Internal Medicine | Admitting: Internal Medicine

## 2016-10-12 DIAGNOSIS — I5032 Chronic diastolic (congestive) heart failure: Secondary | ICD-10-CM | POA: Diagnosis present

## 2016-10-12 LAB — BASIC METABOLIC PANEL
Anion gap: 14 (ref 5–15)
BUN: 17 mg/dL (ref 6–20)
CALCIUM: 9.2 mg/dL (ref 8.9–10.3)
CO2: 40 mmol/L — AB (ref 22–32)
CREATININE: 1.21 mg/dL (ref 0.61–1.24)
Chloride: 83 mmol/L — ABNORMAL LOW (ref 101–111)
GFR calc Af Amer: >60 mL/min (ref >60)
GFR calc non Af Amer: >60 mL/min (ref >60)
GLUCOSE: 98 mg/dL (ref 65–99)
Potassium: 4.4 mmol/L (ref 3.5–5.1)
Sodium: 137 mmol/L (ref 135–145)

## 2016-10-17 ENCOUNTER — Ambulatory Visit: Payer: Medicaid Other | Admitting: Pulmonary Disease

## 2016-10-18 ENCOUNTER — Other Ambulatory Visit (HOSPITAL_COMMUNITY): Payer: Self-pay
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
## 2016-10-25 ENCOUNTER — Ambulatory Visit: Payer: Medicaid Other | Admitting: Adult Health

## 2016-10-25 ENCOUNTER — Other Ambulatory Visit (HOSPITAL_COMMUNITY): Payer: Self-pay

## 2016-10-25 NOTE — Progress Notes (Signed)
Paramedicine Encounter    Patient ID: Douglas [name]., male    DOB: [birth year], 31 y.o.   MRN: [id number]   Patient Care Team: Medicine, [facility] as PCP - General  Patient Active Problem List   Diagnosis Date Noted  . Morbid obesity ([place]) 06/17/2015  . Essential hypertension 04/14/2015  . Hyperkalemia 04/14/2015  . OSA (obstructive sleep apnea)   . Difficult intravenous access   . Chronic diastolic CHF (congestive heart failure), NYHA class 3 ([place]) 09/13/2014  . Cor pulmonale ([place]) 03/19/2014  . OSA treated with BiPAP     Current Outpatient Prescriptions:  .  carvedilol (COREG) 6.25 MG tablet, Take 1.5 tablets (9.375 mg total) by mouth 2 (two) times daily with a meal., Disp: 90 tablet, Rfl: 11 .  hydrALAZINE (APRESOLINE) 25 MG tablet, Take 0.5 tablets (12.5 mg total) by mouth 3 (three) times daily., Disp: 135 tablet, Rfl: 6 .  losartan (COZAAR) 50 MG tablet, Take 1 tablet (50 mg total) by mouth 2 (two) times daily., Disp: 60 tablet, Rfl: 3 .  NON FORMULARY, 2L of continuous oxygen, Disp: , Rfl:  .  PROVENTIL HFA 108 (90 Base) MCG/ACT inhaler, INHALE 1 TO 2 PUFFS INTO THE LUNGS EVERY6 HOURS AS NEEDED FOR WHEEZING OR SHORTNESS OF BREATH, Disp: 6.7 g, Rfl: 5 .  torsemide (DEMADEX) 20 MG tablet, Take 3 tablets (60 mg total) by mouth 2 (two) times daily., Disp: 180 tablet, Rfl: 3 Allergies  Allergen Reactions  . Lisinopril Cough     Social History   Social History  . Marital status: Single    Spouse name: N/A  . Number of children: 2  . Years of education: N/A   Occupational History  . N/A Not Employed   Social History Main Topics  . Smoking status: Former Smoker    Packs/day: 1.50    Years: 3.00    Types: Cigarettes    Quit date: 10/31/2013  . Smokeless tobacco: Never Used  . Alcohol use No  . Drug use: No     Comment: 10/31/2013 "might use cocaine once/month" - Has not used since 2015.   Douglas [name] Kitchen Sexual activity: No   Other Topics Concern  . Not on  file   Social History Narrative  . No narrative on file    Physical Exam  Constitutional: He is oriented to person, place, and time.  Cardiovascular: Normal rate and regular rhythm.   Pulmonary/Chest: Effort normal and breath sounds normal. No respiratory distress. He has no wheezes. He has no rales.  Abdominal: Soft.  Musculoskeletal: Normal range of motion. He exhibits no edema.  Neurological: He is alert and oriented to person, place, and time.  Skin: Skin is warm and dry.  Psychiatric: He has a normal mood and affect.        Future Appointments Date Time Provider [place]  10/25/2016 4:15 PM [name], Fonnie Mu, NP [facility] None  11/21/2016 2:30 PM [facility] PA/NP [facility] None   BP 120/90 (BP Location: Left Arm, Patient Position: Sitting, Cuff Size: Large)   Pulse 75   Resp 16   Wt 288 lb 12.8 oz (131 kg)   SpO2 93%   BMI 49.57 kg/m  Weight yesterday- Unable to weigh Last visit weight- 291 lb  Douglas [name] was seen at home today and reported feeling well. He denied SOB, dizziness, headache, or orthopnea. He was given a new scale today as well that will read off his weight. I also encouraged him to start  using a chart to record his daily weights and HF zone.  His medications were verified and ordered from the pharmacy and his pillbox was refilled.   Douglas [name], EMT 10/25/16  ACTION: Home visit completed Next visit planned for 1 week

## 2016-11-01 ENCOUNTER — Inpatient Hospital Stay (HOSPITAL_COMMUNITY): Admission: RE | Admit: 2016-11-01 | Payer: Medicaid Other | Source: Ambulatory Visit

## 2016-11-01 ENCOUNTER — Telehealth: Payer: Self-pay | Admitting: Licensed Clinical Social Worker

## 2016-11-01 NOTE — Telephone Encounter (Signed)
CSW informed that patient was a no show today in the clinic. CSW attempted to contact patient at both numbers listed in the chart with no success and unable to leave a message on either number. CSW notified Paramedic of no show. CSW will continue to follow through the [employer] for care coordination and any other needs as they arise. Raquel Sarna, [place], [place]

## 2016-11-03 ENCOUNTER — Other Ambulatory Visit (HOSPITAL_COMMUNITY): Payer: Self-pay

## 2016-11-04 NOTE — Progress Notes (Signed)
Pt didn't answer the door.  I tried calling him but both numbers are incorrect.  I will have zak f/u with pt next week

## 2016-11-08 ENCOUNTER — Other Ambulatory Visit (HOSPITAL_COMMUNITY): Payer: Self-pay

## 2016-11-08 NOTE — Progress Notes (Signed)
Paramedicine Encounter    Patient ID: Douglas Madura., male    DOB: Sep 14, 1985, 31 y.o.   MRN: [id number]   Patient Care Team: Medicine, [facility] as PCP - General  Patient Active Problem List   Diagnosis Date Noted  . Morbid obesity ([place]) 06/17/2015  . Essential hypertension 04/14/2015  . Hyperkalemia 04/14/2015  . OSA (obstructive sleep apnea)   . Difficult intravenous access   . Chronic diastolic CHF (congestive heart failure), NYHA class 3 ([place]) 09/13/2014  . Cor pulmonale ([place]) 03/19/2014  . OSA treated with BiPAP     Current Outpatient Prescriptions:  .  carvedilol (COREG) 6.25 MG tablet, Take 1.5 tablets (9.375 mg total) by mouth 2 (two) times daily with a meal., Disp: 90 tablet, Rfl: 11 .  hydrALAZINE (APRESOLINE) 25 MG tablet, Take 0.5 tablets (12.5 mg total) by mouth 3 (three) times daily., Disp: 135 tablet, Rfl: 6 .  losartan (COZAAR) 50 MG tablet, Take 1 tablet (50 mg total) by mouth 2 (two) times daily., Disp: 60 tablet, Rfl: 3 .  NON FORMULARY, 2L of continuous oxygen, Disp: , Rfl:  .  PROVENTIL HFA 108 (90 Base) MCG/ACT inhaler, INHALE 1 TO 2 PUFFS INTO THE LUNGS EVERY6 HOURS AS NEEDED FOR WHEEZING OR SHORTNESS OF BREATH, Disp: 6.7 g, Rfl: 5 .  torsemide (DEMADEX) 20 MG tablet, Take 3 tablets (60 mg total) by mouth 2 (two) times daily., Disp: 180 tablet, Rfl: 3 Allergies  Allergen Reactions  . Lisinopril Cough     Social History   Social History  . Marital status: Single    Spouse name: N/A  . Number of children: 2  . Years of education: N/A   Occupational History  . N/A Not Employed   Social History Main Topics  . Smoking status: Former Smoker    Packs/day: 1.50    Years: 3.00    Types: Cigarettes    Quit date: 10/31/2013  . Smokeless tobacco: Never Used  . Alcohol use No  . Drug use: No     Comment: 10/31/2013 "might use cocaine once/month" - Has not used since 2015.   Marland Kitchen Sexual activity: No   Other Topics Concern  . Not on  file   Social History Narrative  . No narrative on file    Physical Exam  Constitutional: He is oriented to person, place, and time.  Cardiovascular: Normal rate and regular rhythm.   Pulmonary/Chest: Effort normal and breath sounds normal. No respiratory distress.  Abdominal: Soft.  Musculoskeletal: Normal range of motion. He exhibits no edema.  Neurological: He is alert and oriented to person, place, and time.  Skin: Skin is warm.  Psychiatric: He has a normal mood and affect.        Future Appointments Date Time Provider [place]  11/16/2016 12:00 PM [name], [name] S, NP [facility] None   BP (!) 126/100 (BP Location: Left Arm, Patient Position: Sitting, Cuff Size: Large)   Pulse 86   Resp 16   Wt 283 lb 1.6 oz (128.4 kg)   SpO2 93%   BMI 48.59 kg/m  Weight yesterday- 283 lb Last visit weight- 288.8 lb  Douglas [name] was seen at home today and reports feeling well. He was excited to tell me he has lost another 5 pounds since I last saw him and he is using the talking scale daily. He denied SOB, dizziness, headache or orthopnea. He reported that last week Katie or Karena Addison came to see him but he  did not want them to evaluate him or fill his pillbox because he thought he had enough in there. I asked how he thought he had enough to last his two weeks but he stated he forgot that I was on vacation last week. As a result he reports that he went several days without his medications but was able to get started on them last night with the help of his sister. His medications were verified and his pillbox has been filled for one week. We spoke about his follow-up appointment and he stated he did not intentionally cancel the appointment for October 29 th so we were able to get it rescheduled for that day at 13:30.   Time spent with patient: 33 minutes  Douglas [name], EMT 11/08/16  ACTION: Home visit completed Next visit planned for 1 week

## 2016-11-15 ENCOUNTER — Other Ambulatory Visit (HOSPITAL_COMMUNITY): Payer: Self-pay

## 2016-11-15 NOTE — Progress Notes (Signed)
Paramedicine Encounter    Patient ID: Douglas Madura., male    DOB: Sep 03, 1985, 31 y.o.   MRN: [id number]   Patient Care Team: Medicine, [facility] as PCP - General  Patient Active Problem List   Diagnosis Date Noted  . Morbid obesity ([place]) 06/17/2015  . Essential hypertension 04/14/2015  . Hyperkalemia 04/14/2015  . OSA (obstructive sleep apnea)   . Difficult intravenous access   . Chronic diastolic CHF (congestive heart failure), NYHA class 3 ([address]) 09/13/2014  . Cor pulmonale ([place]) 03/19/2014  . OSA treated with BiPAP     Current Outpatient Prescriptions:  .  carvedilol (COREG) 6.25 MG tablet, Take 1.5 tablets (9.375 mg total) by mouth 2 (two) times daily with a meal., Disp: 90 tablet, Rfl: 11 .  hydrALAZINE (APRESOLINE) 25 MG tablet, Take 0.5 tablets (12.5 mg total) by mouth 3 (three) times daily., Disp: 135 tablet, Rfl: 6 .  losartan (COZAAR) 50 MG tablet, Take 1 tablet (50 mg total) by mouth 2 (two) times daily., Disp: 60 tablet, Rfl: 3 .  NON FORMULARY, 2L of continuous oxygen, Disp: , Rfl:  .  PROVENTIL HFA 108 (90 Base) MCG/ACT inhaler, INHALE 1 TO 2 PUFFS INTO THE LUNGS EVERY6 HOURS AS NEEDED FOR WHEEZING OR SHORTNESS OF BREATH, Disp: 6.7 g, Rfl: 5 .  torsemide (DEMADEX) 20 MG tablet, Take 3 tablets (60 mg total) by mouth 2 (two) times daily., Disp: 180 tablet, Rfl: 3 Allergies  Allergen Reactions  . Lisinopril Cough     Social History   Social History  . Marital status: Single    Spouse name: N/A  . Number of children: 2  . Years of education: N/A   Occupational History  . N/A Not Employed   Social History Main Topics  . Smoking status: Former Smoker    Packs/day: 1.50    Years: 3.00    Types: Cigarettes    Quit date: 10/31/2013  . Smokeless tobacco: Never Used  . Alcohol use No  . Drug use: No     Comment: 10/31/2013 "might use cocaine once/month" - Has not used since 2015.   Marland Kitchen Sexual activity: No   Other Topics Concern  . Not on  file   Social History Narrative  . No narrative on file    Physical Exam  Constitutional: He is oriented to person, place, and time.  Cardiovascular: Normal rate and regular rhythm.   Pulmonary/Chest: Breath sounds normal. No respiratory distress. He has no wheezes. He has no rales.  Musculoskeletal: Normal range of motion. He exhibits no edema.  Neurological: He is alert and oriented to person, place, and time.  Skin: Skin is warm and dry.  Psychiatric: He has a normal mood and affect.        Future Appointments Date Time Provider [place]  11/16/2016 12:00 PM [name], Fonnie Mu, NP [facility] None  11/21/2016 1:30 PM [facility] PA/NP [facility] None   BP 90/60 (BP Location: Left Arm, Patient Position: Sitting, Cuff Size: Large)   Pulse 74   Resp 16   Wt 275 lb 12.8 oz (125.1 kg)   SpO2 94%   BMI 47.34 kg/m  Weight yesterday- 275 lb Last visit weight- 283.2 lb  Douglas [name] was seen at home today and reports feeling well. He denied SOB, dizziness, headache or orthopnea. He stated he has been taking his medications as prescribed and continues to lose weight. He would like to know if he must continue to wear oxygen during  the day. I let him know that was something we could ask at his next appointment. Nothing further to report at this time.   Time spent with patient: 30 minutes  [name], EMT 11/15/16  ACTION: Home visit completed Next visit planned for 1 week

## 2016-11-16 ENCOUNTER — Ambulatory Visit (INDEPENDENT_AMBULATORY_CARE_PROVIDER_SITE_OTHER): Payer: Medicaid Other | Admitting: Adult Health

## 2016-11-16 ENCOUNTER — Encounter: Payer: Self-pay | Admitting: Adult Health

## 2016-11-16 DIAGNOSIS — G4733 Obstructive sleep apnea (adult) (pediatric): Secondary | ICD-10-CM | POA: Diagnosis not present

## 2016-11-16 DIAGNOSIS — I5032 Chronic diastolic (congestive) heart failure: Secondary | ICD-10-CM

## 2016-11-16 DIAGNOSIS — Z23 Encounter for immunization: Secondary | ICD-10-CM | POA: Diagnosis not present

## 2016-11-16 NOTE — Patient Instructions (Addendum)
Continue on Oxygen 2l/m .  Great job with weight loss.  Restart BIPAP At bedtime   Wear for at least 4 hr each night . Can try it with naps.  Follow up with Dr. Halford Chessman  In 2 months and As needed

## 2016-11-16 NOTE — Progress Notes (Signed)
@Patient  ID: Douglas Madura., male    DOB: 28-Apr-1985, 31 y.o.   MRN: [id number]  Chief Complaint  Patient presents with  . Follow-up    OSA     Referring provider: Medicine, [facility]  HPI: 31 year old male with chronic respiratory failure from OSA, OHS, cor pulmonale , and Diastolic CHF  Polysubstance abuse   Sleep tests PSG 08/14/09 >> AHI 105.4, SaO2 low 56%, BiPAP 21/17 cm H2O Split 02/05/14 >> AHI 78.2, SaO2 low 52%. CPAP 13 cm H2O >> AHI 5.4, still had low SaO2 >> centrals with higher CPAP/EPAP. ONO with 5 liters 02/27/14 >> test time 58 min. Basal SpO2 66%, low SpO2 50%. Spent 55 min with SpO2 < 88%. Bipap 03/05/16 to 06/02/16 >> used on 62 of 90 nights with average 1 hr 28 min.  Average AHI 3.9 with Bipap 22/18 cm H20  Pulmonary tests V/Q scan 11/01/13 >> upper lobe airtrapping b/l ABG 04/28/15 >> pH 7.37, PCO2 55.5, PO2 65.6  Cardiac tests Echo 11/01/13 >> severe LVH, EF 55 to 60%, mod RA/RV dilation Echo 03/20/14 >> mild LVH, EF 60 to 40%, mod RV systolic dysfx Echo 09/8117 >EF 5-60%, Severely dilated RV , decrease fxn .   11/16/2016  Follow up : OSA, O2 RF , OHS , Cor pulmonale.  Pt returns for 5 month follow up . He has known severe OSA/OHS . He is suppose to be on BIPAP At bedtime  . Says he has not been wearing recently . Encouraged on compliance . He says he is working on weight loss. Has lost total of 50lbs. Does feel some better Has Cor pulmonale and D CHF . On Demadex. Says leg swelling okay without increased edema.  Remains on Oxygen 3l/m  Declines flu shot  Denies chest pain , orthopnea, fever or hemoptysis .        Allergies  Allergen Reactions  . Lisinopril Cough    Immunization History  Administered Date(s) Administered  . Influenza,inj,Quad PF,6+ Mos 11/01/2013, 11/16/2016  . Pneumococcal Polysaccharide-23 11/01/2013    Past Medical History:  Diagnosis Date  . Arthritis    "hands, feet" (10/31/2013)  . CHF (congestive heart  failure) ([place])   . Chronic bronchitis ([place])   . High cholesterol   . Hypertension   . Obesity   . OSA on CPAP   . Pneumonia 09/2011  . Substance abuse ([place]) 8/31/213   cociane "first time use"  . Tobacco abuse     Tobacco History: History  Smoking Status  . Former Smoker  . Packs/day: 1.50  . Years: 3.00  . Types: Cigarettes  . Quit date: 10/31/2013  Smokeless Tobacco  . Never Used   Counseling given: Not Answered   Outpatient Encounter Prescriptions as of 11/16/2016  Medication Sig  . carvedilol (COREG) 6.25 MG tablet Take 1.5 tablets (9.375 mg total) by mouth 2 (two) times daily with a meal.  . hydrALAZINE (APRESOLINE) 25 MG tablet Take 0.5 tablets (12.5 mg total) by mouth 3 (three) times daily.  Marland Kitchen losartan (COZAAR) 50 MG tablet Take 1 tablet (50 mg total) by mouth 2 (two) times daily.  . NON FORMULARY 2L of continuous oxygen  . PROVENTIL HFA 108 (90 Base) MCG/ACT inhaler INHALE 1 TO 2 PUFFS INTO THE LUNGS EVERY6 HOURS AS NEEDED FOR WHEEZING OR SHORTNESS OF BREATH  . torsemide (DEMADEX) 20 MG tablet Take 3 tablets (60 mg total) by mouth 2 (two) times daily.   No facility-administered encounter medications on  file as of 11/16/2016.      Review of Systems  Constitutional:   No  weight loss, night sweats,  Fevers, chills,  +fatigue, or  lassitude.  HEENT:   No headaches,  Difficulty swallowing,  Tooth/dental problems, or  Sore throat,                No sneezing, itching, ear ache,  +nasal congestion, post nasal drip,   CV:  No chest pain,  Orthopnea, PND, swelling in lower extremities, anasarca, dizziness, palpitations, syncope.   GI  No heartburn, indigestion, abdominal pain, nausea, vomiting, diarrhea, change in bowel habits, loss of appetite, bloody stools.   Resp:   No chest wall deformity  Skin: no rash or lesions.  GU: no dysuria, change in color of urine, no urgency or frequency.  No flank pain, no hematuria   MS:  No joint pain or swelling.  No decreased  range of motion.  No back pain.    Physical Exam  BP 124/76 (BP Location: Left Arm, Cuff Size: Normal)   Pulse 71   Ht 5\' 4"  (1.626 m)   Wt 278 lb 12.8 oz (126.5 kg)   SpO2 92%   BMI 47.86 kg/m   GEN: A/Ox3; pleasant , NAD, obese    HEENT:  [place]/AT,  EACs-clear, TMs-wnl, NOSE-clear, THROAT-clear, no lesions, no postnasal drip or exudate noted. Class 2-3 MP airway   NECK:  Supple w/ fair ROM; no JVD; normal carotid impulses w/o bruits; no thyromegaly or nodules palpated; no lymphadenopathy.    RESP  Clear  P & A; w/o, wheezes/ rales/ or rhonchi. no accessory muscle use, no dullness to percussion  CARD:  RRR, no m/r/g, tr peripheral edema, pulses intact, no cyanosis or clubbing.  GI:   Soft & nt; nml bowel sounds; no organomegaly or masses detected.   Musco: Warm bil, no deformities or joint swelling noted.   Neuro: alert, no focal deficits noted.    Skin: Warm, no lesions or rashes    Lab Results:  BMET  Imaging: No results found.   Assessment & Plan:   No problem-specific Assessment & Plan notes found for this encounter.     Rexene Edison, NP

## 2016-11-17 NOTE — Assessment & Plan Note (Signed)
Wt loss  

## 2016-11-17 NOTE — Assessment & Plan Note (Signed)
BIPAP compliance encouarged   Plan  Patient Instructions  Continue on Oxygen 2l/m .  Great job with weight loss.  Restart BIPAP At bedtime   Wear for at least 4 hr each night . Can try it with naps.  Follow up with Dr. Halford Chessman  In 2 months and As needed

## 2016-11-17 NOTE — Assessment & Plan Note (Signed)
Appears compensated without evidence of volume overload  Cont on current regimen

## 2016-11-17 NOTE — Progress Notes (Signed)
I have reviewed and agree with assessment/plan.  [name], MD [facility] Pulmonary/Critical Care 11/17/2016, 1:46 PM Pager:  [phone]

## 2016-11-21 ENCOUNTER — Encounter (HOSPITAL_COMMUNITY): Payer: Self-pay

## 2016-11-21 ENCOUNTER — Ambulatory Visit (HOSPITAL_COMMUNITY)
Admission: RE | Admit: 2016-11-21 | Discharge: 2016-11-21 | Disposition: A | Discharge status: Home / Self Care | Payer: Medicaid Other | Source: Ambulatory Visit | Attending: Cardiology | Admitting: Cardiology

## 2016-11-21 ENCOUNTER — Encounter (HOSPITAL_COMMUNITY): Payer: Medicaid Other

## 2016-11-21 VITALS — BP 116/86 | HR 76 | Wt 280.0 lb

## 2016-11-21 DIAGNOSIS — Z79899 Other long term (current) drug therapy: Secondary | ICD-10-CM | POA: Insufficient documentation

## 2016-11-21 DIAGNOSIS — I110 Hypertensive heart disease with heart failure: Secondary | ICD-10-CM | POA: Diagnosis not present

## 2016-11-21 DIAGNOSIS — E662 Morbid (severe) obesity with alveolar hypoventilation: Secondary | ICD-10-CM | POA: Insufficient documentation

## 2016-11-21 DIAGNOSIS — J42 Unspecified chronic bronchitis: Secondary | ICD-10-CM | POA: Insufficient documentation

## 2016-11-21 DIAGNOSIS — Z87891 Personal history of nicotine dependence: Secondary | ICD-10-CM | POA: Diagnosis not present

## 2016-11-21 DIAGNOSIS — I5032 Chronic diastolic (congestive) heart failure: Secondary | ICD-10-CM | POA: Insufficient documentation

## 2016-11-21 DIAGNOSIS — I2781 Cor pulmonale (chronic): Secondary | ICD-10-CM | POA: Diagnosis not present

## 2016-11-21 DIAGNOSIS — G4733 Obstructive sleep apnea (adult) (pediatric): Secondary | ICD-10-CM | POA: Diagnosis not present

## 2016-11-21 DIAGNOSIS — E7800 Pure hypercholesterolemia, unspecified: Secondary | ICD-10-CM | POA: Insufficient documentation

## 2016-11-21 DIAGNOSIS — I272 Pulmonary hypertension, unspecified: Secondary | ICD-10-CM | POA: Diagnosis not present

## 2016-11-21 LAB — BASIC METABOLIC PANEL
Anion gap: 11 (ref 5–15)
BUN: 18 mg/dL (ref 6–20)
CALCIUM: 9.4 mg/dL (ref 8.9–10.3)
CO2: 38 mmol/L — ABNORMAL HIGH (ref 22–32)
CREATININE: 1.25 mg/dL — AB (ref 0.61–1.24)
Chloride: 89 mmol/L — ABNORMAL LOW (ref 101–111)
GFR calc Af Amer: >60 mL/min (ref >60)
GLUCOSE: 94 mg/dL (ref 65–99)
Potassium: 4.2 mmol/L (ref 3.5–5.1)
Sodium: 138 mmol/L (ref 135–145)

## 2016-11-21 NOTE — Patient Instructions (Signed)
Labs today (will call for abnormal results, otherwise no news is good news)  You have been referred to Pulmonary Rehab, they will contact you to schedule your initial appointment.  Follow up in 3 months.

## 2016-11-21 NOTE — Progress Notes (Signed)
Patient ID: [name], male   DOB: August 11, 1985, 31 y.o.   MRN: [id number]    Advanced Heart Failure Clinic Note   PCP: none.  Primary Cardiologist: Dr Aundra Dubin  Pulmonary  HPI: Mr Shinault is a 31 yo with OHS/OSA, morbid obesity, HTN, diastolic CHF with prominent RV failure.   Admitted in August 2016  to [facility] with volume overload. Diuresed with lasix drip and diamox. Transitioned to lasix 80 mg twice a day. Discharge weight 257 pounds.   Recent admit to [facility] 3/21 through 04/28/15. Admitted with respiratory distress. Required short term intubation. Apparently had not been wearing BiPap. Discharge weight was 259 pounds.   Returns today for HF follow up. Weight continues to decrease. He says he is really watching his diet, he has cut out all sweets and has stopped drinking juice. Taking all medications, followed by paramedicine. He says that he was recently seen by Pulmonology, he has not been wearing CPAP often. Says he needs to start wearing it, but has not worn it for the past few nights. Denies orthopnea, PND, chest pain.   - ECHO 03/20/2014 EF 60-65% - ECHO 8/18: EF 55-60%, grade II diastolic dydsfunction, D-shaped septum suggesting RV pressure/volume overload, severe RV dilation with mild-moderately decreased RV systolic function, unable to estimate PA pressure.   Labs 09/21/2014: K 3.9 Creatinine 1.03  Labs 11/26/2014: K 4.2 Creatinine 1.08  Labs 12/30/2014: K 4.7 Creatinine 1.05  Labs 04/26/2015: K 4.0 Creatinine 0.94  Labs 10/17: K 4.1, creatinine 1.17 Labs 8/18: K 4.4, creatinine 1.26  ROS: All systems negative except as listed in HPI, PMH and Problem List.  SH:  Social History   Social History  . Marital status: Single    Spouse name: N/A  . Number of children: 2  . Years of education: N/A   Occupational History  . N/A Not Employed   Social History Main Topics  . Smoking status: Former Smoker    Packs/day: 1.50    Years: 3.00    Types: Cigarettes    Quit date:  10/31/2013  . Smokeless tobacco: Never Used  . Alcohol use No  . Drug use: No     Comment: 10/31/2013 "might use cocaine once/month" - Has not used since 2015.   Marland Kitchen Sexual activity: No   Other Topics Concern  . Not on file   Social History Narrative  . No narrative on file    FH:  Family History  Problem Relation Age of Onset  . Heart disease Mother        Pacemaker  . Hypertension Father   . Cancer Paternal Grandfather   . Heart attack Neg Hx   . Stroke Neg Hx     Past Medical History:  Diagnosis Date  . Arthritis    "hands, feet" (10/31/2013)  . CHF (congestive heart failure) ([place])   . Chronic bronchitis ([place])   . High cholesterol   . Hypertension   . Obesity   . OSA on CPAP   . Pneumonia 09/2011  . Substance abuse ([place]) 8/31/213   cociane "first time use"  . Tobacco abuse     Current Outpatient Prescriptions  Medication Sig Dispense Refill  . carvedilol (COREG) 6.25 MG tablet Take 1.5 tablets (9.375 mg total) by mouth 2 (two) times daily with a meal. 90 tablet 11  . hydrALAZINE (APRESOLINE) 25 MG tablet Take 0.5 tablets (12.5 mg total) by mouth 3 (three) times daily. 135 tablet 6  . losartan (COZAAR) 50 MG  tablet Take 1 tablet (50 mg total) by mouth 2 (two) times daily. 60 tablet 3  . NON FORMULARY 2L of continuous oxygen    . PROVENTIL HFA 108 (90 Base) MCG/ACT inhaler INHALE 1 TO 2 PUFFS INTO THE LUNGS EVERY6 HOURS AS NEEDED FOR WHEEZING OR SHORTNESS OF BREATH 6.7 g 5  . torsemide (DEMADEX) 20 MG tablet Take 3 tablets (60 mg total) by mouth 2 (two) times daily. 180 tablet 3   No current facility-administered medications for this encounter.     Vitals:   11/21/16 1329  BP: 116/86  Pulse: 76  SpO2: 90%  Weight: 280 lb (127 kg)   Wt Readings from Last 3 Encounters:  11/21/16 280 lb (127 kg)  11/16/16 278 lb 12.8 oz (126.5 kg)  11/15/16 275 lb 12.8 oz (125.1 kg)     PHYSICAL EXAM: General: Chronically ill appearing.  HEENT: Normal. Wearing oxygen.    Neck: Supple. No obvious JVP.  Carotids 2+ bilat; no bruits. No thyromegaly or nodule noted. Cor: PMI nondisplaced. RRR, No M/G/R noted Lungs: CTAB, normal effort. Abdomen: Obese, soft, non-tender, non-distended, no HSM. No bruits or masses. +BS  Extremities: No cyanosis, clubbing, or rash. R and LLE no edema.  Neuro: Alert & orientedx3, cranial nerves grossly intact. moves all 4 extremities w/o difficulty. Affect pleasant   ASSESSMENT & PLAN:  1.Chronic diastolic CHF with prominent RV failure: Echo 08/30/16 with D shaped septum and severely enlarged RV. RV failure in the setting of OSA and OHS.  - NYHA III - Volume stable on exam. Continue Lasix 120 mg BID. BMET today.  - Continue losartan 50 mg BID - Continue Coreg 9.375 mg BID - No Spiro with history of hyperkalemia.   2. OHS/OSA:  - Encouraged compliance with CPAP.   3. HTN:   - Well controlled on current regimen.    4. RV failure:  - VQ scan without PE.  - Continue Oxygen.   Follow up in 3 months. Refer to pulmonary rehab. BMET today.   Arbutus Leas 11/21/2016  Greater than 50% of the (total minutes 30) visit spent in counseling/coordination of care regarding CPAP compliance, medication compliance, disease education.

## 2016-11-25 ENCOUNTER — Telehealth (HOSPITAL_COMMUNITY): Payer: Self-pay

## 2016-11-25 NOTE — Telephone Encounter (Signed)
Attempted to call patient to see if he was interested in the Maintenance program - No voicemail is set up

## 2016-11-29 ENCOUNTER — Other Ambulatory Visit (HOSPITAL_COMMUNITY): Payer: Self-pay

## 2016-11-29 NOTE — Progress Notes (Signed)
Paramedicine Encounter    Patient ID: Douglas Madura., male    DOB: 01/23/1986, 31 y.o.   MRN: [id number]   Patient Care Team: Medicine, [facility] as PCP - General  Patient Active Problem List   Diagnosis Date Noted  . Morbid obesity ([place]) 06/17/2015  . Essential hypertension 04/14/2015  . Hyperkalemia 04/14/2015  . OSA (obstructive sleep apnea)   . Difficult intravenous access   . Chronic diastolic CHF (congestive heart failure), NYHA class 3 ([place]) 09/13/2014  . Cor pulmonale ([place]) 03/19/2014  . OSA treated with BiPAP     Current Outpatient Medications:  .  carvedilol (COREG) 6.25 MG tablet, Take 1.5 tablets (9.375 mg total) by mouth 2 (two) times daily with a meal., Disp: 90 tablet, Rfl: 11 .  hydrALAZINE (APRESOLINE) 25 MG tablet, Take 0.5 tablets (12.5 mg total) by mouth 3 (three) times daily., Disp: 135 tablet, Rfl: 6 .  losartan (COZAAR) 50 MG tablet, Take 1 tablet (50 mg total) by mouth 2 (two) times daily., Disp: 60 tablet, Rfl: 3 .  NON FORMULARY, 2L of continuous oxygen, Disp: , Rfl:  .  PROVENTIL HFA 108 (90 Base) MCG/ACT inhaler, INHALE 1 TO 2 PUFFS INTO THE LUNGS EVERY6 HOURS AS NEEDED FOR WHEEZING OR SHORTNESS OF BREATH, Disp: 6.7 g, Rfl: 5 .  torsemide (DEMADEX) 20 MG tablet, Take 3 tablets (60 mg total) by mouth 2 (two) times daily., Disp: 180 tablet, Rfl: 3 Allergies  Allergen Reactions  . Lisinopril Cough      Social History   Socioeconomic History  . Marital status: Single    Spouse name: Not on file  . Number of children: 2  . Years of education: Not on file  . Highest education level: Not on file  Social Needs  . Financial resource strain: Not on file  . Food insecurity - worry: Not on file  . Food insecurity - inability: Not on file  . Transportation needs - medical: Not on file  . Transportation needs - non-medical: Not on file  Occupational History  . Occupation: N/A    Employer: NOT EMPLOYED  Tobacco Use  . Smoking  status: Former Smoker    Packs/day: 1.50    Years: 3.00    Pack years: 4.50    Types: Cigarettes    Last attempt to quit: 10/31/2013    Years since quitting: 3.0  . Smokeless tobacco: Never Used  Substance and Sexual Activity  . Alcohol use: No    Alcohol/week: 0.6 oz    Types: 1 Cans of beer per week  . Drug use: No    Comment: 10/31/2013 "might use cocaine once/month" - Has not used since 2015.   Marland Kitchen Sexual activity: No  Other Topics Concern  . Not on file  Social History Narrative  . Not on file    Physical Exam  Constitutional: He is oriented to person, place, and time.  Cardiovascular: Normal rate and regular rhythm.  Pulmonary/Chest: Effort normal and breath sounds normal. No respiratory distress. He has no wheezes. He has no rales.  Abdominal: Soft. He exhibits no distension.  Musculoskeletal: Normal range of motion. He exhibits no edema.  Neurological: He is alert and oriented to person, place, and time.  Skin: Skin is warm and dry.  Psychiatric: He has a normal mood and affect.        Future Appointments  Date Time Provider [place]  12/29/2016 11:30 AM [name], MD [facility] None  02/21/2017  1:30 PM [facility] PA/NP [facility] None    BP 118/80 (BP Location: Left Arm, Patient Position: Sitting, Cuff Size: Large)   Pulse 70   Resp 16   Wt 275 lb (124.7 kg)   SpO2 98%   BMI 47.20 kg/m   Weight yesterday- 274 lb Last visit weight- 275.8 lb  [name] was seen at home today and reports feeling generally well. He denied SOB, dizziness, orthopnea or headaches. He filled his own pillbox prior to my arrival and only had minimal errors. He says he feels like he is getting the hang of this and wants to become independent again. He also mentioned to me that he wants to find a job because he is tired of not getting out of the house. This will also mean needing to get a portable oxygen condenser that can be used while at work if he is not able to go without  oxygen. I will bring this to the clinic staff's attention and see what they advise. Medications were verified and none need to be refilled at this time.   Time spent with patient: 33 minutes  [name], EMT 11/29/16  ACTION: Home visit completed Next visit planned for 1 week

## 2016-11-30 ENCOUNTER — Telehealth: Payer: Self-pay | Admitting: Licensed Clinical Social Worker

## 2016-11-30 NOTE — Telephone Encounter (Signed)
CSW collaborated with [name] in starting a SCAT application with patient. Edwyna Ready will take out to patient's home and after completion return it to [place] for completion. CSW will continue to follow through the [employer] for care coordination and any other needs as they arise. Raquel Sarna, [place], [place]

## 2016-12-07 ENCOUNTER — Other Ambulatory Visit (HOSPITAL_COMMUNITY): Payer: Self-pay

## 2016-12-07 NOTE — Progress Notes (Signed)
Paramedicine Encounter    Patient ID: Douglas [name]., male    DOB: 1985/11/23, 31 y.o.   MRN: [id number]   Patient Care Team: Medicine, [facility] as PCP - General  Patient Active Problem List   Diagnosis Date Noted  . Morbid obesity ([place]) 06/17/2015  . Essential hypertension 04/14/2015  . Hyperkalemia 04/14/2015  . OSA (obstructive sleep apnea)   . Difficult intravenous access   . Chronic diastolic CHF (congestive heart failure), NYHA class 3 ([place]) 09/13/2014  . Cor pulmonale ([place]) 03/19/2014  . OSA treated with BiPAP     Current Outpatient Medications:  .  carvedilol (COREG) 6.25 MG tablet, Take 1.5 tablets (9.375 mg total) by mouth 2 (two) times daily with a meal., Disp: 90 tablet, Rfl: 11 .  hydrALAZINE (APRESOLINE) 25 MG tablet, Take 0.5 tablets (12.5 mg total) by mouth 3 (three) times daily., Disp: 135 tablet, Rfl: 6 .  losartan (COZAAR) 50 MG tablet, Take 1 tablet (50 mg total) by mouth 2 (two) times daily., Disp: 60 tablet, Rfl: 3 .  NON FORMULARY, 2L of continuous oxygen, Disp: , Rfl:  .  PROVENTIL HFA 108 (90 Base) MCG/ACT inhaler, INHALE 1 TO 2 PUFFS INTO THE LUNGS EVERY6 HOURS AS NEEDED FOR WHEEZING OR SHORTNESS OF BREATH, Disp: 6.7 g, Rfl: 5 .  torsemide (DEMADEX) 20 MG tablet, Take 3 tablets (60 mg total) by mouth 2 (two) times daily., Disp: 180 tablet, Rfl: 3 Allergies  Allergen Reactions  . Lisinopril Cough      Social History   Socioeconomic History  . Marital status: Single    Spouse name: Not on file  . Number of children: 2  . Years of education: Not on file  . Highest education level: Not on file  Social Needs  . Financial resource strain: Not on file  . Food insecurity - worry: Not on file  . Food insecurity - inability: Not on file  . Transportation needs - medical: Not on file  . Transportation needs - non-medical: Not on file  Occupational History  . Occupation: N/A    Employer: NOT EMPLOYED  Tobacco Use  . Smoking  status: Former Smoker    Packs/day: 1.50    Years: 3.00    Pack years: 4.50    Types: Cigarettes    Last attempt to quit: 10/31/2013    Years since quitting: 3.1  . Smokeless tobacco: Never Used  Substance and Sexual Activity  . Alcohol use: No    Alcohol/week: 0.6 oz    Types: 1 Cans of beer per week  . Drug use: No    Comment: 10/31/2013 "might use cocaine once/month" - Has not used since 2015.   Marland Kitchen Sexual activity: No  Other Topics Concern  . Not on file  Social History Narrative  . Not on file    Physical Exam  Constitutional: He is oriented to person, place, and time.  Cardiovascular: Normal rate and regular rhythm.  Pulmonary/Chest: Effort normal and breath sounds normal. No respiratory distress. He has no wheezes. He has no rales.  Musculoskeletal: Normal range of motion. He exhibits no edema.  Neurological: He is alert and oriented to person, place, and time.  Skin: Skin is warm and dry.  Psychiatric: He has a normal mood and affect.        Future Appointments  Date Time Provider [place]  12/29/2016 11:30 AM [name], MD [facility] None  02/21/2017  1:30 PM [facility] PA/NP [facility] None  BP (!) 144/98 (BP Location: Left Arm, Patient Position: Sitting, Cuff Size: Large)   Pulse 78   Resp 16   Wt 278 lb 3.2 oz (126.2 kg)   SpO2 98%   BMI 47.75 kg/m   Weight yesterday- 275 lb Last visit weight- 275 lb   Douglas [name] was seen at home today and reported feeling well. He denied SOB, dizziness, headache or orthopnea. He was hypertensive but said he had forgotten to take his morning medications. He reports being otherwise compliant with all medications. We worked together to fill out his SCAT application and two medications were called in for refill. He asked for assistance with getting his kids christmas gifts. Medications were verified and his pillbox was refilled.   Time spent with patient: 49 minutes  Douglas [name],  EMT 12/07/16  ACTION: Home visit completed Next visit planned for 1 week

## 2016-12-09 ENCOUNTER — Other Ambulatory Visit (HOSPITAL_COMMUNITY): Payer: Self-pay

## 2016-12-09 DIAGNOSIS — I5022 Chronic systolic (congestive) heart failure: Secondary | ICD-10-CM

## 2016-12-12 ENCOUNTER — Other Ambulatory Visit (HOSPITAL_COMMUNITY): Payer: Self-pay

## 2016-12-12 NOTE — Progress Notes (Signed)
Paramedicine Encounter    Patient ID: Douglas Madura., male    DOB: 01-Mar-1985, 31 y.o.   MRN: [id number]   Patient Care Team: Medicine, [facility] as PCP - General  Patient Active Problem List   Diagnosis Date Noted  . Morbid obesity ([place]) 06/17/2015  . Essential hypertension 04/14/2015  . Hyperkalemia 04/14/2015  . OSA (obstructive sleep apnea)   . Difficult intravenous access   . Chronic diastolic CHF (congestive heart failure), NYHA class 3 ([place]) 09/13/2014  . Cor pulmonale ([place]) 03/19/2014  . OSA treated with BiPAP     Current Outpatient Medications:  .  carvedilol (COREG) 6.25 MG tablet, Take 1.5 tablets (9.375 mg total) by mouth 2 (two) times daily with a meal., Disp: 90 tablet, Rfl: 11 .  hydrALAZINE (APRESOLINE) 25 MG tablet, Take 0.5 tablets (12.5 mg total) by mouth 3 (three) times daily., Disp: 135 tablet, Rfl: 6 .  losartan (COZAAR) 50 MG tablet, Take 1 tablet (50 mg total) by mouth 2 (two) times daily., Disp: 60 tablet, Rfl: 3 .  NON FORMULARY, 2L of continuous oxygen, Disp: , Rfl:  .  PROVENTIL HFA 108 (90 Base) MCG/ACT inhaler, INHALE 1 TO 2 PUFFS INTO THE LUNGS EVERY6 HOURS AS NEEDED FOR WHEEZING OR SHORTNESS OF BREATH, Disp: 6.7 g, Rfl: 5 .  torsemide (DEMADEX) 20 MG tablet, Take 3 tablets (60 mg total) by mouth 2 (two) times daily., Disp: 180 tablet, Rfl: 3 Allergies  Allergen Reactions  . Lisinopril Cough      Social History   Socioeconomic History  . Marital status: Single    Spouse name: Not on file  . Number of children: 2  . Years of education: Not on file  . Highest education level: Not on file  Social Needs  . Financial resource strain: Not on file  . Food insecurity - worry: Not on file  . Food insecurity - inability: Not on file  . Transportation needs - medical: Not on file  . Transportation needs - non-medical: Not on file  Occupational History  . Occupation: N/A    Employer: NOT EMPLOYED  Tobacco Use  . Smoking  status: Former Smoker    Packs/day: 1.50    Years: 3.00    Pack years: 4.50    Types: Cigarettes    Last attempt to quit: 10/31/2013    Years since quitting: 3.1  . Smokeless tobacco: Never Used  Substance and Sexual Activity  . Alcohol use: No    Alcohol/week: 0.6 oz    Types: 1 Cans of beer per week  . Drug use: No    Comment: 10/31/2013 "might use cocaine once/month" - Has not used since 2015.   Marland Kitchen Sexual activity: No  Other Topics Concern  . Not on file  Social History Narrative  . Not on file    Physical Exam  Constitutional: He is oriented to person, place, and time.  Cardiovascular: Normal rate and regular rhythm.  Pulmonary/Chest: Effort normal and breath sounds normal. No respiratory distress. He has no wheezes. He has no rales.  Abdominal: Soft.  Musculoskeletal: Normal range of motion. He exhibits no edema.  Neurological: He is alert and oriented to person, place, and time.  Skin: Skin is warm and dry.  Psychiatric: He has a normal mood and affect.        Future Appointments  Date Time Provider [place]  12/29/2016 11:30 AM [name], MD [facility] None  02/21/2017  1:30 PM [facility] PA/NP  [facility] None    BP 110/84 (BP Location: Left Arm, Patient Position: Sitting, Cuff Size: Large)   Pulse 70   Resp 16   Wt 277 lb (125.6 kg)   SpO2 96%   BMI 47.55 kg/m   Weight yesterday- 276 lb Last visit weight- 278 lb  Douglas [name] was seen at home and reported feeling well. He denied SOB, headaches, dizziness or orthopnea. He has been taking his medications as prescribed and is filling his own pillbox now. He stated he has been experiencing impotence and wanted to know if it was safe to take an OTC supplement called UltraLast XXL. I told him I would ask the pharmacist at the clinic and let him know but to refrain from taking it until I found out more information. Medications were verified and his pillbox was checked.   Time spent with patient: 34  minutes  [name], EMT 12/12/16  ACTION: Home visit completed Next visit planned for 1 week

## 2016-12-13 ENCOUNTER — Telehealth (HOSPITAL_COMMUNITY): Payer: Self-pay

## 2016-12-13 ENCOUNTER — Telehealth (HOSPITAL_COMMUNITY): Payer: Self-pay | Admitting: Pharmacist

## 2016-12-13 MED ORDER — SILDENAFIL CITRATE 50 MG PO TABS: 50.0000 mg | ORAL_TABLET | Freq: Every day | ORAL | 0 refills | Status: DC | PRN

## 2016-12-13 NOTE — Telephone Encounter (Signed)
EMS Douglas [name] from the [facility] called on behalf of patient - Patient wants to attend program. Explained to [facility] that patient does not qualify for our [facility] Pulmonary Program but can participate in our Maintenance Self-Pay Pulmonary Rehab Program. Will give a call back after speaking with patient.

## 2016-12-13 NOTE — Telephone Encounter (Signed)
Mr. Mukai asked if he could use a supplement call Vanice Sarah for performance issues. I have recommended against this and per discussion with Dr. Aundra Dubin will send a new Rx for Viagra 50 mg PRN.   [name], PharmD, BCPS, CPP Clinical Pharmacist Pager: [phone] Phone: [phone] 12/13/2016 11:37 AM

## 2016-12-14 ENCOUNTER — Telehealth (HOSPITAL_COMMUNITY): Payer: Self-pay

## 2016-12-14 NOTE — Telephone Encounter (Signed)
I called Mr Allemand to advise him that the clinic advised against using the OTC UltraLast XXL and was sending a prescription of viagra to his pharmacy. He was understanding and agreeable.

## 2016-12-20 ENCOUNTER — Other Ambulatory Visit (HOSPITAL_COMMUNITY): Payer: Self-pay

## 2016-12-20 NOTE — Progress Notes (Signed)
Paramedicine Encounter    Patient ID: Douglas [name]., male    DOB: 02-17-1985, 31 y.o.   MRN: [id number]   Patient Care Team: Medicine, [facility] as PCP - General  Patient Active Problem List   Diagnosis Date Noted  . Morbid obesity ([place]) 06/17/2015  . Essential hypertension 04/14/2015  . Hyperkalemia 04/14/2015  . OSA (obstructive sleep apnea)   . Difficult intravenous access   . Chronic diastolic CHF (congestive heart failure), NYHA class 3 ([place]) 09/13/2014  . Cor pulmonale ([place]) 03/19/2014  . OSA treated with BiPAP     Current Outpatient Medications:  .  carvedilol (COREG) 6.25 MG tablet, Take 1.5 tablets (9.375 mg total) by mouth 2 (two) times daily with a meal., Disp: 90 tablet, Rfl: 11 .  hydrALAZINE (APRESOLINE) 25 MG tablet, Take 0.5 tablets (12.5 mg total) by mouth 3 (three) times daily., Disp: 135 tablet, Rfl: 6 .  losartan (COZAAR) 50 MG tablet, Take 1 tablet (50 mg total) by mouth 2 (two) times daily., Disp: 60 tablet, Rfl: 3 .  NON FORMULARY, 2L of continuous oxygen, Disp: , Rfl:  .  PROVENTIL HFA 108 (90 Base) MCG/ACT inhaler, INHALE 1 TO 2 PUFFS INTO THE LUNGS EVERY6 HOURS AS NEEDED FOR WHEEZING OR SHORTNESS OF BREATH, Disp: 6.7 g, Rfl: 5 .  sildenafil (VIAGRA) 50 MG tablet, Take 1 tablet (50 mg total) by mouth daily as needed for erectile dysfunction., Disp: 10 tablet, Rfl: 0 .  torsemide (DEMADEX) 20 MG tablet, Take 3 tablets (60 mg total) by mouth 2 (two) times daily., Disp: 180 tablet, Rfl: 3 Allergies  Allergen Reactions  . Lisinopril Cough      Social History   Socioeconomic History  . Marital status: Single    Spouse name: Not on file  . Number of children: 2  . Years of education: Not on file  . Highest education level: Not on file  Social Needs  . Financial resource strain: Not on file  . Food insecurity - worry: Not on file  . Food insecurity - inability: Not on file  . Transportation needs - medical: Not on file  .  Transportation needs - non-medical: Not on file  Occupational History  . Occupation: N/A    Employer: NOT EMPLOYED  Tobacco Use  . Smoking status: Former Smoker    Packs/day: 1.50    Years: 3.00    Pack years: 4.50    Types: Cigarettes    Last attempt to quit: 10/31/2013    Years since quitting: 3.1  . Smokeless tobacco: Never Used  Substance and Sexual Activity  . Alcohol use: No    Alcohol/week: 0.6 oz    Types: 1 Cans of beer per week  . Drug use: No    Comment: 10/31/2013 "might use cocaine once/month" - Has not used since 2015.   Marland Kitchen Sexual activity: No  Other Topics Concern  . Not on file  Social History Narrative  . Not on file    Physical Exam  Constitutional: He is oriented to person, place, and time.  Cardiovascular: Normal rate and regular rhythm.  Pulmonary/Chest: Effort normal and breath sounds normal. No respiratory distress. He has no wheezes. He has no rales.  Abdominal: Soft.  Musculoskeletal: Normal range of motion. He exhibits no edema.  Neurological: He is alert and oriented to person, place, and time.  Skin: Skin is warm and dry.  Psychiatric: He has a normal mood and affect.  Future Appointments  Date Time Provider [place]  12/29/2016 11:30 AM [name], MD [facility] None  02/21/2017  1:30 PM [facility] PA/NP [facility] None    BP 102/78 (BP Location: Left Arm, Patient Position: Sitting, Cuff Size: Large)   Pulse 73   Resp 16   Wt 276 lb 3.2 oz (125.3 kg)   SpO2 96%   BMI 47.41 kg/m   Weight yesterday- 274 lb Last visit weight- 277 lb  Douglas [name] was seen at home today and reported feeling generally well. He denied SOB, headache, dizziness or orthopnea. He stated he has been taking his medications and monitoring his fluid intake. We discussed his job prospects as well as planned his travel to [facility] in two weeks, with regard to taking his oxygen. He will be taking his condenser and bottle filling apparatus from home. He will  need an Rx for a battery powered oxygen apparatus which will be necessary for him to work. I will bring this up with a provider at the clinic but [place] advised it would take 6-8 weeks after getting the note from the doctor before he would be able to get the device. His medications were verified and his pillbox was refilled. I ordered Hydralazine and Losartan from the pharmacy and they will be delivered today.   Time spent with patient: 52 minutes  Douglas [name], EMT 12/20/16  ACTION: Home visit completed Next visit planned for 1 week

## 2016-12-28 ENCOUNTER — Other Ambulatory Visit (HOSPITAL_COMMUNITY): Payer: Self-pay

## 2016-12-28 NOTE — Progress Notes (Signed)
Paramedicine Encounter    Patient ID: Douglas Madura., male    DOB: Mar 14, 1985, 31 y.o.   MRN: [id number]   Patient Care Team: Medicine, [facility] as PCP - General  Patient Active Problem List   Diagnosis Date Noted  . Morbid obesity ([place]) 06/17/2015  . Essential hypertension 04/14/2015  . Hyperkalemia 04/14/2015  . OSA (obstructive sleep apnea)   . Difficult intravenous access   . Chronic diastolic CHF (congestive heart failure), NYHA class 3 ([place]) 09/13/2014  . Cor pulmonale ([place]) 03/19/2014  . OSA treated with BiPAP     Current Outpatient Medications:  .  carvedilol (COREG) 6.25 MG tablet, Take 1.5 tablets (9.375 mg total) by mouth 2 (two) times daily with a meal., Disp: 90 tablet, Rfl: 11 .  hydrALAZINE (APRESOLINE) 25 MG tablet, Take 0.5 tablets (12.5 mg total) by mouth 3 (three) times daily., Disp: 135 tablet, Rfl: 6 .  losartan (COZAAR) 50 MG tablet, Take 1 tablet (50 mg total) by mouth 2 (two) times daily., Disp: 60 tablet, Rfl: 3 .  NON FORMULARY, 2L of continuous oxygen, Disp: , Rfl:  .  PROVENTIL HFA 108 (90 Base) MCG/ACT inhaler, INHALE 1 TO 2 PUFFS INTO THE LUNGS EVERY6 HOURS AS NEEDED FOR WHEEZING OR SHORTNESS OF BREATH, Disp: 6.7 g, Rfl: 5 .  sildenafil (VIAGRA) 50 MG tablet, Take 1 tablet (50 mg total) by mouth daily as needed for erectile dysfunction., Disp: 10 tablet, Rfl: 0 .  torsemide (DEMADEX) 20 MG tablet, Take 3 tablets (60 mg total) by mouth 2 (two) times daily., Disp: 180 tablet, Rfl: 3 Allergies  Allergen Reactions  . Lisinopril Cough      Social History   Socioeconomic History  . Marital status: Single    Spouse name: Not on file  . Number of children: 2  . Years of education: Not on file  . Highest education level: Not on file  Social Needs  . Financial resource strain: Not on file  . Food insecurity - worry: Not on file  . Food insecurity - inability: Not on file  . Transportation needs - medical: Not on file  .  Transportation needs - non-medical: Not on file  Occupational History  . Occupation: N/A    Employer: NOT EMPLOYED  Tobacco Use  . Smoking status: Former Smoker    Packs/day: 1.50    Years: 3.00    Pack years: 4.50    Types: Cigarettes    Last attempt to quit: 10/31/2013    Years since quitting: 3.1  . Smokeless tobacco: Never Used  Substance and Sexual Activity  . Alcohol use: No    Alcohol/week: 0.6 oz    Types: 1 Cans of beer per week  . Drug use: No    Comment: 10/31/2013 "might use cocaine once/month" - Has not used since 2015.   Marland Kitchen Sexual activity: No  Other Topics Concern  . Not on file  Social History Narrative  . Not on file    Physical Exam  Constitutional: He is oriented to person, place, and time.  Neck: Normal range of motion.  Cardiovascular: Normal rate and regular rhythm.  Pulmonary/Chest: Effort normal and breath sounds normal. No respiratory distress. He has no wheezes. He has no rales.  Abdominal: Soft.  Musculoskeletal: Normal range of motion. He exhibits no edema.  Neurological: He is alert and oriented to person, place, and time.  Skin: Skin is warm and dry.  Psychiatric: He has a normal mood and  affect.        Future Appointments  Date Time Provider [place]  12/29/2016 11:30 AM [name], MD [facility] None  02/21/2017  1:30 PM [facility] PA/NP [facility] None    BP 130/90 (BP Location: Right Arm, Patient Position: Sitting, Cuff Size: Large)   Pulse 73   Resp 16   Wt 277 lb 12.8 oz (126 kg)   SpO2 94%   BMI 47.68 kg/m   Weight yesterday- 276 lb Last visit weight- 276.2 lb  Douglas [name] was seen at home today and reported feeling well. He denied SOB, dizziness, headache or orthopnea. He reported being compliant with his pillbox and had filled it prior to my arrival. 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 He Also wanted to know if he would be able to start driving again soon. I spoke to [place] at the clinic who said he had not previously been placed on driving restrictions and did not see a reason why he couldn't drive.   Time spent with patient: 50 minutes  [name], EMT 12/28/16  ACTION: Home visit completed Next visit planned for 1 week

## 2016-12-29 ENCOUNTER — Ambulatory Visit: Payer: Medicaid Other | Admitting: Pulmonary Disease

## 2016-12-29 ENCOUNTER — Other Ambulatory Visit (HOSPITAL_COMMUNITY): Payer: Self-pay

## 2016-12-29 NOTE — Progress Notes (Signed)
I saw Douglas [name] at home today and assisted him in creating and email and submitting an application to [employer] for their 12 week paid work program. He had no complaints or questions while I was there.

## 2017-01-04 ENCOUNTER — Ambulatory Visit: Payer: Medicaid Other | Admitting: Pulmonary Disease

## 2017-01-05 ENCOUNTER — Other Ambulatory Visit (HOSPITAL_COMMUNITY): Payer: Self-pay

## 2017-01-05 NOTE — Progress Notes (Signed)
Paramedicine Encounter    Patient ID: Douglas Madura., male    DOB: 1985/08/30, 31 y.o.   MRN: [id number]   Patient Care Team: Medicine, [facility] as PCP - General  Patient Active Problem List   Diagnosis Date Noted  . Morbid obesity ([place]) 06/17/2015  . Essential hypertension 04/14/2015  . Hyperkalemia 04/14/2015  . OSA (obstructive sleep apnea)   . Difficult intravenous access   . Chronic diastolic CHF (congestive heart failure), NYHA class 3 ([place]) 09/13/2014  . Cor pulmonale ([place]) 03/19/2014  . OSA treated with BiPAP     Current Outpatient Medications:  .  carvedilol (COREG) 6.25 MG tablet, Take 1.5 tablets (9.375 mg total) by mouth 2 (two) times daily with a meal., Disp: 90 tablet, Rfl: 11 .  hydrALAZINE (APRESOLINE) 25 MG tablet, Take 0.5 tablets (12.5 mg total) by mouth 3 (three) times daily., Disp: 135 tablet, Rfl: 6 .  losartan (COZAAR) 50 MG tablet, Take 1 tablet (50 mg total) by mouth 2 (two) times daily., Disp: 60 tablet, Rfl: 3 .  NON FORMULARY, 2L of continuous oxygen, Disp: , Rfl:  .  PROVENTIL HFA 108 (90 Base) MCG/ACT inhaler, INHALE 1 TO 2 PUFFS INTO THE LUNGS EVERY6 HOURS AS NEEDED FOR WHEEZING OR SHORTNESS OF BREATH, Disp: 6.7 g, Rfl: 5 .  sildenafil (VIAGRA) 50 MG tablet, Take 1 tablet (50 mg total) by mouth daily as needed for erectile dysfunction., Disp: 10 tablet, Rfl: 0 .  torsemide (DEMADEX) 20 MG tablet, Take 3 tablets (60 mg total) by mouth 2 (two) times daily., Disp: 180 tablet, Rfl: 3 Allergies  Allergen Reactions  . Lisinopril Cough      Social History   Socioeconomic History  . Marital status: Single    Spouse name: Not on file  . Number of children: 2  . Years of education: Not on file  . Highest education level: Not on file  Social Needs  . Financial resource strain: Not on file  . Food insecurity - worry: Not on file  . Food insecurity - inability: Not on file  . Transportation needs - medical: Not on file  .  Transportation needs - non-medical: Not on file  Occupational History  . Occupation: N/A    Employer: NOT EMPLOYED  Tobacco Use  . Smoking status: Former Smoker    Packs/day: 1.50    Years: 3.00    Pack years: 4.50    Types: Cigarettes    Last attempt to quit: 10/31/2013    Years since quitting: 3.1  . Smokeless tobacco: Never Used  Substance and Sexual Activity  . Alcohol use: No    Alcohol/week: 0.6 oz    Types: 1 Cans of beer per week  . Drug use: No    Comment: 10/31/2013 "might use cocaine once/month" - Has not used since 2015.   Marland Kitchen Sexual activity: No  Other Topics Concern  . Not on file  Social History Narrative  . Not on file    Physical Exam  Constitutional: He is oriented to person, place, and time.  Cardiovascular: Normal rate and regular rhythm.  Pulmonary/Chest: Effort normal and breath sounds normal. No respiratory distress. He has no wheezes. He has no rales.  Musculoskeletal: Normal range of motion. He exhibits no edema.  Neurological: He is alert and oriented to person, place, and time.  Skin: Skin is warm and dry.        Future Appointments  Date Time Provider [place]  02/21/2017  1:30 PM [facility] PA/NP [facility] None    BP 122/82 (BP Location: Right Arm, Patient Position: Sitting, Cuff Size: Large)   Pulse 74   Resp 16   Wt 280 lb (127 kg)   SpO2 98%   BMI 48.06 kg/m   Weight yesterday- 277 lb Last visit weight- 277 lb  [name] was seen at home today and reported feeling well. He denied SOB, headache, dizziness or orthopnea. He said he got an e-mail saying that the application we worked on last week did not go through so we spent most of the visit filling out an [employer]er[employer] television[employer]film set job application with [employer]. His medications were verified and his pillbox was refilled.   Time spent with patient: 79 minutes  [name], EMT 01/05/17  ACTION: Home visit completed Next visit planned for 1 week

## 2017-01-10 ENCOUNTER — Other Ambulatory Visit (HOSPITAL_COMMUNITY): Payer: Self-pay

## 2017-01-10 NOTE — Progress Notes (Signed)
Paramedicine Encounter    Patient ID: Douglas [name]., male    DOB: 12-Jul-1985, 31 y.o.   MRN: [id number]   Patient Care Team: Medicine, [facility] as PCP - General  Patient Active Problem List   Diagnosis Date Noted  . Morbid obesity ([place]) 06/17/2015  . Essential hypertension 04/14/2015  . Hyperkalemia 04/14/2015  . OSA (obstructive sleep apnea)   . Difficult intravenous access   . Chronic diastolic CHF (congestive heart failure), NYHA class 3 ([place]) 09/13/2014  . Cor pulmonale ([place]) 03/19/2014  . OSA treated with BiPAP     Current Outpatient Medications:  .  carvedilol (COREG) 6.25 MG tablet, Take 1.5 tablets (9.375 mg total) by mouth 2 (two) times daily with a meal., Disp: 90 tablet, Rfl: 11 .  hydrALAZINE (APRESOLINE) 25 MG tablet, Take 0.5 tablets (12.5 mg total) by mouth 3 (three) times daily., Disp: 135 tablet, Rfl: 6 .  losartan (COZAAR) 50 MG tablet, Take 1 tablet (50 mg total) by mouth 2 (two) times daily., Disp: 60 tablet, Rfl: 3 .  NON FORMULARY, 2L of continuous oxygen, Disp: , Rfl:  .  PROVENTIL HFA 108 (90 Base) MCG/ACT inhaler, INHALE 1 TO 2 PUFFS INTO THE LUNGS EVERY6 HOURS AS NEEDED FOR WHEEZING OR SHORTNESS OF BREATH, Disp: 6.7 g, Rfl: 5 .  sildenafil (VIAGRA) 50 MG tablet, Take 1 tablet (50 mg total) by mouth daily as needed for erectile dysfunction., Disp: 10 tablet, Rfl: 0 .  torsemide (DEMADEX) 20 MG tablet, Take 3 tablets (60 mg total) by mouth 2 (two) times daily., Disp: 180 tablet, Rfl: 3 Allergies  Allergen Reactions  . Lisinopril Cough      Social History   Socioeconomic History  . Marital status: Single    Spouse name: Not on file  . Number of children: 2  . Years of education: Not on file  . Highest education level: Not on file  Social Needs  . Financial resource strain: Not on file  . Food insecurity - worry: Not on file  . Food insecurity - inability: Not on file  . Transportation needs - medical: Not on file  .  Transportation needs - non-medical: Not on file  Occupational History  . Occupation: N/A    Employer: NOT EMPLOYED  Tobacco Use  . Smoking status: Former Smoker    Packs/day: 1.50    Years: 3.00    Pack years: 4.50    Types: Cigarettes    Last attempt to quit: 10/31/2013    Years since quitting: 3.1  . Smokeless tobacco: Never Used  Substance and Sexual Activity  . Alcohol use: No    Alcohol/week: 0.6 oz    Types: 1 Cans of beer per week  . Drug use: No    Comment: 10/31/2013 "might use cocaine once/month" - Has not used since 2015.   Marland Kitchen Sexual activity: No  Other Topics Concern  . Not on file  Social History Narrative  . Not on file    Physical Exam  Constitutional: He is oriented to person, place, and time.  Cardiovascular: Normal rate and regular rhythm.  Pulmonary/Chest: Effort normal and breath sounds normal. No respiratory distress. He has no wheezes. He has no rales.  Abdominal: Soft.  Musculoskeletal: Normal range of motion. He exhibits no edema.  Neurological: He is alert and oriented to person, place, and time.  Skin: Skin is warm and dry.  Psychiatric: He has a normal mood and affect.  Future Appointments  Date Time Provider [place]  02/21/2017  1:30 PM [facility] PA/NP [facility] None    BP 108/70 (BP Location: Left Arm, Patient Position: Sitting, Cuff Size: Large)   Pulse 82   Resp 16   Wt 283 lb (128.4 kg)   SpO2 96%   BMI 48.58 kg/m   Weight yesterday- 284 lb Last visit weight- 280 lb  Mr Illescas was seen at home today and reported feeling well. He denied SOB, dizziness, headache and orthopnea. His battery powered oxygen came last week which he was excited to have. He has been taking his medications and refilling his own box. His medications were verified and his box was checked for any inaccuracies and found to be properly filled. He requested I check with [employer] about his application because he has not been able to get anyone on the  phone.   Time spent with patient: 27 minutes   [name], EMT 01/10/17  ACTION: Home visit completed Next visit planned for 1 week

## 2017-01-19 ENCOUNTER — Telehealth (HOSPITAL_COMMUNITY): Payer: Self-pay

## 2017-01-19 NOTE — Telephone Encounter (Signed)
I called Douglas [name] to schedule an appointment. He did not answer so I left a voicemail requesting he call me back.  

## 2017-01-20 ENCOUNTER — Other Ambulatory Visit (HOSPITAL_COMMUNITY): Payer: Self-pay | Admitting: Cardiology

## 2017-01-20 ENCOUNTER — Other Ambulatory Visit (HOSPITAL_COMMUNITY): Payer: Self-pay

## 2017-01-20 DIAGNOSIS — I5032 Chronic diastolic (congestive) heart failure: Secondary | ICD-10-CM

## 2017-01-20 NOTE — Progress Notes (Signed)
Paramedicine Encounter    Patient ID: [name], male    DOB: Sep 08, 1985, 31 y.o.   MRN: [id number]   Patient Care Team: Medicine, [facility] as PCP - General  Patient Active Problem List   Diagnosis Date Noted  . Morbid obesity ([place]) 06/17/2015  . Essential hypertension 04/14/2015  . Hyperkalemia 04/14/2015  . OSA (obstructive sleep apnea)   . Difficult intravenous access   . Chronic diastolic CHF (congestive heart failure), NYHA class 3 ([place]) 09/13/2014  . Cor pulmonale ([place]) 03/19/2014  . OSA treated with BiPAP     Current Outpatient Medications:  .  carvedilol (COREG) 6.25 MG tablet, Take 1.5 tablets (9.375 mg total) by mouth 2 (two) times daily with a meal., Disp: 90 tablet, Rfl: 11 .  hydrALAZINE (APRESOLINE) 25 MG tablet, Take 0.5 tablets (12.5 mg total) by mouth 3 (three) times daily., Disp: 135 tablet, Rfl: 6 .  losartan (COZAAR) 50 MG tablet, Take 1 tablet (50 mg total) by mouth 2 (two) times daily., Disp: 60 tablet, Rfl: 3 .  NON FORMULARY, 2L of continuous oxygen, Disp: , Rfl:  .  PROVENTIL HFA 108 (90 Base) MCG/ACT inhaler, INHALE 1 TO 2 PUFFS INTO THE LUNGS EVERY6 HOURS AS NEEDED FOR WHEEZING OR SHORTNESS OF BREATH, Disp: 6.7 g, Rfl: 5 .  sildenafil (VIAGRA) 50 MG tablet, Take 1 tablet (50 mg total) by mouth daily as needed for erectile dysfunction., Disp: 10 tablet, Rfl: 0 .  torsemide (DEMADEX) 20 MG tablet, Take 3 tablets (60 mg total) by mouth 2 (two) times daily., Disp: 180 tablet, Rfl: 3 Allergies  Allergen Reactions  . Lisinopril Cough      Social History   Socioeconomic History  . Marital status: Single    Spouse name: Not on file  . Number of children: 2  . Years of education: Not on file  . Highest education level: Not on file  Social Needs  . Financial resource strain: Not on file  . Food insecurity - worry: Not on file  . Food insecurity - inability: Not on file  . Transportation needs - medical: Not on file  .  Transportation needs - non-medical: Not on file  Occupational History  . Occupation: N/A    Employer: NOT EMPLOYED  Tobacco Use  . Smoking status: Former Smoker    Packs/day: 1.50    Years: 3.00    Pack years: 4.50    Types: Cigarettes    Last attempt to quit: 10/31/2013    Years since quitting: 3.2  . Smokeless tobacco: Never Used  Substance and Sexual Activity  . Alcohol use: No    Alcohol/week: 0.6 oz    Types: 1 Cans of beer per week  . Drug use: No    Comment: 10/31/2013 "might use cocaine once/month" - Has not used since 2015.   Marland Kitchen Sexual activity: No  Other Topics Concern  . Not on file  Social History Narrative  . Not on file    Physical Exam  Constitutional: He is oriented to person, place, and time.  Cardiovascular: Normal rate and regular rhythm.  Pulmonary/Chest: Effort normal and breath sounds normal. No respiratory distress. He has no wheezes. He has no rales.  Abdominal: Soft. He exhibits no distension.  Musculoskeletal: Normal range of motion. He exhibits no edema.  Neurological: He is alert and oriented to person, place, and time.  Skin: Skin is warm and dry.  Psychiatric: He has a normal mood and affect.  Future Appointments  Date Time Provider [place]  02/08/2017  4:30 PM [name], MD [facility] None  02/21/2017  1:30 PM [facility] PA/NP [facility] None    BP (!) 130/100 (BP Location: Left Arm, Patient Position: Sitting, Cuff Size: Large)   Pulse 88   Resp 16   Wt 281 lb (127.5 kg)   SpO2 95%   BMI 48.23 kg/m   Weight yesterday- 282 lb Last visit weight- 283 lb   Mr Pendry was seen at home today. He reported feeling well but had not taken his medications today. He reported taking other days but he just had not gotten around to it today. He reported having trouble sleeping when he lays flat but says it is resolved when he uses his CPAP. His medications were refilled and his pillbox was refilled.   Time spent with patient: 33  minutes  [name], EMT 01/20/17  ACTION: Home visit completed Next visit planned for 1 week

## 2017-01-23 ENCOUNTER — Telehealth (HOSPITAL_COMMUNITY): Payer: Self-pay | Admitting: Pharmacist

## 2017-01-23 NOTE — Telephone Encounter (Signed)
Received a request for PA for Viagra for Mr. Bjelland from [facility]. No ED medications are on [place] Medicaid formulary.   [name], PharmD, BCPS, CPP Clinical Pharmacist Pager: [phone] Phone: [phone] 01/23/2017 11:23 AM

## 2017-01-25 ENCOUNTER — Other Ambulatory Visit (HOSPITAL_COMMUNITY): Payer: Self-pay

## 2017-01-25 ENCOUNTER — Other Ambulatory Visit (HOSPITAL_COMMUNITY): Payer: Self-pay | Admitting: *Deleted

## 2017-01-25 MED ORDER — TORSEMIDE 20 MG PO TABS: 60.0000 mg | ORAL_TABLET | Freq: Two times a day (BID) | ORAL | 3 refills | Status: DC

## 2017-01-25 NOTE — Progress Notes (Signed)
Paramedicine Encounter    Patient ID: Douglas Madura., male    DOB: [birth year], 32 y.o.   MRN: [id number]   Patient Care Team: Medicine, [facility] as PCP - General  Patient Active Problem List   Diagnosis Date Noted  . Morbid obesity ([place]) 06/17/2015  . Essential hypertension 04/14/2015  . Hyperkalemia 04/14/2015  . OSA (obstructive sleep apnea)   . Difficult intravenous access   . Chronic diastolic CHF (congestive heart failure), NYHA class 3 ([place]) 09/13/2014  . Cor pulmonale ([place]) 03/19/2014  . OSA treated with BiPAP     Current Outpatient Medications:  .  carvedilol (COREG) 6.25 MG tablet, Take 1.5 tablets (9.375 mg total) by mouth 2 (two) times daily with a meal., Disp: 90 tablet, Rfl: 11 .  hydrALAZINE (APRESOLINE) 25 MG tablet, Take 0.5 tablets (12.5 mg total) by mouth 3 (three) times daily., Disp: 135 tablet, Rfl: 6 .  losartan (COZAAR) 50 MG tablet, TAKE ONE (1) TABLET BY MOUTH TWO (2) TIMES DAILY, Disp: 60 tablet, Rfl: 3 .  NON FORMULARY, 2L of continuous oxygen, Disp: , Rfl:  .  PROVENTIL HFA 108 (90 Base) MCG/ACT inhaler, INHALE 1 TO 2 PUFFS INTO THE LUNGS EVERY6 HOURS AS NEEDED FOR WHEEZING OR SHORTNESS OF BREATH, Disp: 6.7 g, Rfl: 5 .  torsemide (DEMADEX) 20 MG tablet, Take 3 tablets (60 mg total) by mouth 2 (two) times daily., Disp: 180 tablet, Rfl: 3 .  sildenafil (VIAGRA) 50 MG tablet, Take 1 tablet (50 mg total) by mouth daily as needed for erectile dysfunction. (Patient not taking: Reported on 01/25/2017), Disp: 10 tablet, Rfl: 0 Allergies  Allergen Reactions  . Lisinopril Cough      Social History   Socioeconomic History  . Marital status: Single    Spouse name: Not on file  . Number of children: 2  . Years of education: Not on file  . Highest education level: Not on file  Social Needs  . Financial resource strain: Not on file  . Food insecurity - worry: Not on file  . Food insecurity - inability: Not on file  . Transportation  needs - medical: Not on file  . Transportation needs - non-medical: Not on file  Occupational History  . Occupation: N/A    Employer: NOT EMPLOYED  Tobacco Use  . Smoking status: Former Smoker    Packs/day: 1.50    Years: 3.00    Pack years: 4.50    Types: Cigarettes    Last attempt to quit: 10/31/2013    Years since quitting: 3.2  . Smokeless tobacco: Never Used  Substance and Sexual Activity  . Alcohol use: No    Alcohol/week: 0.6 oz    Types: 1 Cans of beer per week  . Drug use: No    Comment: 10/31/2013 "might use cocaine once/month" - Has not used since 2015.   Marland Kitchen Sexual activity: No  Other Topics Concern  . Not on file  Social History Narrative  . Not on file    Physical Exam  Constitutional: He is oriented to person, place, and time.  Cardiovascular: Normal rate and regular rhythm.  Pulmonary/Chest: Effort normal and breath sounds normal. No respiratory distress. He has no wheezes. He has no rales.  Abdominal: Soft.  Musculoskeletal: Normal range of motion. He exhibits no edema.  Neurological: He is alert and oriented to person, place, and time.  Skin: Skin is warm and dry.  Psychiatric: He has a normal mood and affect.  Future Appointments  Date Time Provider [place]  02/08/2017  4:30 PM [name], MD [facility] None  02/21/2017  1:30 PM [facility] PA/NP [facility] None    BP 110/70 (BP Location: Left Arm, Patient Position: Sitting, Cuff Size: Large)   Pulse 76   Resp 16   Wt 280 lb (127 kg)   SpO2 95%   BMI 48.06 kg/m   Weight yesterday- 282 lb Last visit weight- 281 lb  [name] was seen at home today and reported feeling well. He stated he did drink some alcohol on NYE which left him feeling unwell yesterday. He denied SOB, headache, dizziness or orthopnea today. He missed a two doses of evening medications over the past week. I ordered hydralazine and checked on the status of his Viagra Rx and the pharmacy advised that 10 pill  would cost him ~$47. He stated he would call the pharmacy when he gets paid and have them deliver the medications. His medications were verified and his pillbox was refilled.   Time spent with patient: 27 minutes  [name], EMT 01/25/17  ACTION: Home visit completed Next visit planned for 1 week

## 2017-02-02 ENCOUNTER — Other Ambulatory Visit (HOSPITAL_COMMUNITY): Payer: Self-pay

## 2017-02-02 NOTE — Progress Notes (Signed)
Paramedicine Encounter    Patient ID: [name], male    DOB: 09-17-1985, 32 y.o.   MRN: [id number]   Patient Care Team: Medicine, [facility] as PCP - General  Patient Active Problem List   Diagnosis Date Noted  . Morbid obesity ([place]) 06/17/2015  . Essential hypertension 04/14/2015  . Hyperkalemia 04/14/2015  . OSA (obstructive sleep apnea)   . Difficult intravenous access   . Chronic diastolic CHF (congestive heart failure), NYHA class 3 ([place]) 09/13/2014  . Cor pulmonale ([place]) 03/19/2014  . OSA treated with BiPAP     Current Outpatient Medications:  .  carvedilol (COREG) 6.25 MG tablet, Take 1.5 tablets (9.375 mg total) by mouth 2 (two) times daily with a meal., Disp: 90 tablet, Rfl: 11 .  hydrALAZINE (APRESOLINE) 25 MG tablet, Take 0.5 tablets (12.5 mg total) by mouth 3 (three) times daily., Disp: 135 tablet, Rfl: 6 .  NON FORMULARY, 2L of continuous oxygen, Disp: , Rfl:  .  PROVENTIL HFA 108 (90 Base) MCG/ACT inhaler, INHALE 1 TO 2 PUFFS INTO THE LUNGS EVERY6 HOURS AS NEEDED FOR WHEEZING OR SHORTNESS OF BREATH, Disp: 6.7 g, Rfl: 5 .  torsemide (DEMADEX) 20 MG tablet, Take 3 tablets (60 mg total) by mouth 2 (two) times daily., Disp: 180 tablet, Rfl: 3 .  losartan (COZAAR) 50 MG tablet, TAKE ONE (1) TABLET BY MOUTH TWO (2) TIMES DAILY, Disp: 60 tablet, Rfl: 3 .  sildenafil (VIAGRA) 50 MG tablet, Take 1 tablet (50 mg total) by mouth daily as needed for erectile dysfunction. (Patient not taking: Reported on 01/25/2017), Disp: 10 tablet, Rfl: 0 Allergies  Allergen Reactions  . Lisinopril Cough      Social History   Socioeconomic History  . Marital status: Single    Spouse name: Not on file  . Number of children: 2  . Years of education: Not on file  . Highest education level: Not on file  Social Needs  . Financial resource strain: Not on file  . Food insecurity - worry: Not on file  . Food insecurity - inability: Not on file  . Transportation  needs - medical: Not on file  . Transportation needs - non-medical: Not on file  Occupational History  . Occupation: N/A    Employer: NOT EMPLOYED  Tobacco Use  . Smoking status: Former Smoker    Packs/day: 1.50    Years: 3.00    Pack years: 4.50    Types: Cigarettes    Last attempt to quit: 10/31/2013    Years since quitting: 3.2  . Smokeless tobacco: Never Used  Substance and Sexual Activity  . Alcohol use: No    Alcohol/week: 0.6 oz    Types: 1 Cans of beer per week  . Drug use: No    Comment: 10/31/2013 "might use cocaine once/month" - Has not used since 2015.   Marland Kitchen Sexual activity: No  Other Topics Concern  . Not on file  Social History Narrative  . Not on file    Physical Exam  Constitutional: He is oriented to person, place, and time.  Cardiovascular: Normal rate and regular rhythm.  Pulmonary/Chest: Effort normal and breath sounds normal. No respiratory distress. He has no wheezes. He has no rales.  Abdominal: Soft.  Musculoskeletal: Normal range of motion. He exhibits no edema.  Neurological: He is alert and oriented to person, place, and time.  Skin: Skin is warm and dry.  Psychiatric: He has a normal mood and affect.  Future Appointments  Date Time Provider [place]  02/08/2017  4:30 PM [name], MD [facility] None  02/21/2017  1:30 PM [facility] PA/NP [facility] None    BP 108/82 (BP Location: Left Arm, Patient Position: Sitting, Cuff Size: Large)   Pulse 84   Resp 16   Wt 285 lb (129.3 kg)   SpO2 95%   BMI 48.92 kg/m   Weight yesterday- 284 lb Last visit weight- 280 lb   Mr Bartnik was seen at home today and reported feeling well. He denied SOB, headache, dizziness or orthopnea. He stated he has been taking his medications as directed. His medications were verified and his pillbox was refilled. He advised me that he had an increase in weight of 5 lb since last week. I contacted the clinic about this who advised to have him take 80 mg  of torsemide BID for the next two days. This information was relayed via text message and Mr Orourke was understanding and agreeable   Time spent with patient: 29 minutes  [name], EMT 02/02/17  ACTION: Home visit completed Next visit planned for 1 week

## 2017-02-08 ENCOUNTER — Encounter: Payer: Self-pay | Admitting: Pulmonary Disease

## 2017-02-08 ENCOUNTER — Other Ambulatory Visit (HOSPITAL_COMMUNITY): Payer: Self-pay

## 2017-02-08 ENCOUNTER — Ambulatory Visit (INDEPENDENT_AMBULATORY_CARE_PROVIDER_SITE_OTHER): Payer: Medicaid Other | Admitting: Pulmonary Disease

## 2017-02-08 VITALS — BP 130/62 | HR 85 | Ht 64.0 in | Wt 288.2 lb

## 2017-02-08 DIAGNOSIS — J9611 Chronic respiratory failure with hypoxia: Secondary | ICD-10-CM

## 2017-02-08 DIAGNOSIS — G4733 Obstructive sleep apnea (adult) (pediatric): Secondary | ICD-10-CM

## 2017-02-08 NOTE — Progress Notes (Signed)
Paramedicine Encounter    Patient ID: Douglas Madura., male    DOB: 11-10-1985, 32 y.o.   MRN: [id number]   Patient Care Team: Medicine, [facility] as PCP - General  Patient Active Problem List   Diagnosis Date Noted  . Morbid obesity ([place]) 06/17/2015  . Essential hypertension 04/14/2015  . Hyperkalemia 04/14/2015  . OSA (obstructive sleep apnea)   . Difficult intravenous access   . Chronic diastolic CHF (congestive heart failure), NYHA class 3 ([place]) 09/13/2014  . Cor pulmonale ([place]) 03/19/2014  . OSA treated with BiPAP     Current Outpatient Medications:  .  carvedilol (COREG) 6.25 MG tablet, Take 1.5 tablets (9.375 mg total) by mouth 2 (two) times daily with a meal., Disp: 90 tablet, Rfl: 11 .  hydrALAZINE (APRESOLINE) 25 MG tablet, Take 0.5 tablets (12.5 mg total) by mouth 3 (three) times daily., Disp: 135 tablet, Rfl: 6 .  losartan (COZAAR) 50 MG tablet, TAKE ONE (1) TABLET BY MOUTH TWO (2) TIMES DAILY, Disp: 60 tablet, Rfl: 3 .  NON FORMULARY, 2L of continuous oxygen, Disp: , Rfl:  .  PROVENTIL HFA 108 (90 Base) MCG/ACT inhaler, INHALE 1 TO 2 PUFFS INTO THE LUNGS EVERY6 HOURS AS NEEDED FOR WHEEZING OR SHORTNESS OF BREATH, Disp: 6.7 g, Rfl: 5 .  torsemide (DEMADEX) 20 MG tablet, Take 3 tablets (60 mg total) by mouth 2 (two) times daily., Disp: 180 tablet, Rfl: 3 .  sildenafil (VIAGRA) 50 MG tablet, Take 1 tablet (50 mg total) by mouth daily as needed for erectile dysfunction. (Patient not taking: Reported on 01/25/2017), Disp: 10 tablet, Rfl: 0 Allergies  Allergen Reactions  . Lisinopril Cough      Social History   Socioeconomic History  . Marital status: Single    Spouse name: Not on file  . Number of children: 2  . Years of education: Not on file  . Highest education level: Not on file  Social Needs  . Financial resource strain: Not on file  . Food insecurity - worry: Not on file  . Food insecurity - inability: Not on file  . Transportation  needs - medical: Not on file  . Transportation needs - non-medical: Not on file  Occupational History  . Occupation: N/A    Employer: NOT EMPLOYED  Tobacco Use  . Smoking status: Former Smoker    Packs/day: 1.50    Years: 3.00    Pack years: 4.50    Types: Cigarettes    Last attempt to quit: 10/31/2013    Years since quitting: 3.2  . Smokeless tobacco: Never Used  Substance and Sexual Activity  . Alcohol use: No    Alcohol/week: 0.6 oz    Types: 1 Cans of beer per week  . Drug use: No    Comment: 10/31/2013 "might use cocaine once/month" - Has not used since 2015.   Marland Kitchen Sexual activity: No  Other Topics Concern  . Not on file  Social History Narrative  . Not on file    Physical Exam  Constitutional: He is oriented to person, place, and time.  Cardiovascular: Normal rate and regular rhythm.  Pulmonary/Chest: Effort normal and breath sounds normal. No respiratory distress. He has no wheezes. He has no rales.  Abdominal: Soft.  Musculoskeletal: Normal range of motion. He exhibits no edema.  Neurological: He is alert and oriented to person, place, and time.  Skin: Skin is warm and dry.  Psychiatric: He has a normal mood and affect.  Future Appointments  Date Time Provider [place]  02/08/2017  4:30 PM [name], MD [facility] None  02/21/2017  1:30 PM [facility] PA/NP [facility] None    BP 100/80 (BP Location: Left Arm, Patient Position: Sitting, Cuff Size: Large)   Pulse 77   Resp 18   Wt 283 lb (128.4 kg)   SpO2 98%   BMI 48.58 kg/m   Weight yesterday- 282 lb Last visit weight- 285 lb  Douglas [name] was seen at home today and reported feeling well. He denied SOB, headache, dizziness or orthopnea. He had missed 3 evening and one morning slots of medications over the last week. He says he didn't remember which days he missed or why he missed them so I suggested we get an alarm clock to set for him. We called [facility] and left a voicemail since he has not  heard back on his application. His medications were verified and his pillbox was refilled.   Time spent with patient: 25 minutes  [name], EMT 02/08/17  ACTION: Home visit completed Next visit planned for 1 week

## 2017-02-08 NOTE — Progress Notes (Signed)
Douglas [name], Critical Care, and Sleep Medicine  Chief Complaint  Patient presents with  . Follow-up    follow up OSA, states that he uses CPAP off and on,feels like it may have a leak in the bottom,feels that he may need to get a new mask,feels like he is getting enough rest,uses [employer]    Vital signs: BP 130/62 (BP Location: Right Arm, Cuff Size: Normal)   Pulse 85   Ht 5\' 4"  (1.626 m)   Wt 288 lb 3.2 oz (130.7 kg)   SpO2 94%   BMI 49.47 kg/m   History of Present Illness: Douglas [name]. is a 31 y.o. male with obstructive sleep apnea, obesity hypoventilation syndrome, and chronic respiratory failure.  He has been walking more and changed his diet.  He has lost 30 lbs since I last saw him.  He feels his mask is leaking more, and he doesn't get a good seal.  Bipap download showed AHI 1 with Bipap 22/18 cm H2O with significant leak.  He still uses 3 to 4 liters oxygen 24/7.  Physical Exam:  General - pleasant, wearing oxygen Eyes - pupils reactive ENT - no sinus tenderness, no oral exudate, no LAN Cardiac - regular, no murmur Chest - no wheeze, rales Abd - soft, non tender Ext - no edema Skin - no rashes Neuro - normal strength Psych - normal mood  Assessment/Plan:  Obstructive sleep apnea. - he reports benefit from Bipap - he is having issues with mask leak - will get his mask refitted - will drop his Bipap setting to 18/14 cm H2O   Chronic hypoxic/hypercapnic respiratory failure with cor pulmonale 2nd to obesity hypoventilation syndrome. - continue 3 liters oxygen 24/7 for now - explained that he might get to the point he doesn't need supplemental oxygen if he can get his weight down further  Morbid obesity - explained how his weight is main contributor to his respiratory problems  Diastolic heart failure - f/u with cardiology  Cough. - prn albuterol   Patient Instructions  Will have [employer] refit your Bipap mask and adjust the pressure  setting for your machine  Follow up in 6 months    [name], MD [place] 02/08/2017, 5:08 PM Pager:  [phone]  Flow Sheet  Sleep tests: PSG 08/14/09 >> AHI 105.4, SaO2 low 56%, BiPAP 21/17 cm H2O Split 02/05/14 >> AHI 78.2, SaO2 low 52%. CPAP 13 cm H2O >> AHI 5.4, still had low SaO2 >> centrals with higher CPAP/EPAP. ONO with 5 liters 02/27/14 >> test time 58 min. Basal SpO2 66%, low SpO2 50%. Spent 55 min with SpO2 < 88%. Bipap 03/05/16 to 06/02/16 >> used on 62 of 90 nights with average 1 hr 28 min.  Average AHI 3.9 with Bipap 22/18 cm H20  [name] tests V/Q scan 11/01/13 >> upper lobe airtrapping b/l ABG 04/28/15 >> pH 7.37, PCO2 55.5, PO2 65.6 V/Q scan 09/06/16 >> no PE  Cardiac tests Echo 08/30/16 >> mild LVH, EF 55 to 60%, grade 2 DD  Past Medical History: He  has a past medical history of Arthritis, CHF (congestive heart failure) ([place]), Chronic bronchitis ([place]), High cholesterol, Hypertension, Obesity, OSA on CPAP, Pneumonia (09/2011), Substance abuse ([place]) (8/31/213), and Tobacco abuse.  Past Surgical History: He  has a past surgical history that includes No past surgeries and TUBES IN EARS.  Family History: His family history includes Cancer in his paternal grandfather; Heart disease in his mother; Hypertension in his father.  Social History: He  reports that he quit smoking about 3 years ago. His smoking use included cigarettes. He has a 4.50 pack-year smoking history. he has never used smokeless tobacco. He reports that he does not drink alcohol or use drugs.  Medications: Allergies as of 02/08/2017      Reactions   Lisinopril Cough      Medication List        Accurate as of 02/08/17  5:08 PM. Always use your most recent med list.          carvedilol 6.25 MG tablet Commonly known as:  COREG Take 1.5 tablets (9.375 mg total) by mouth 2 (two) times daily with a meal.   hydrALAZINE 25 MG tablet Commonly known as:   APRESOLINE Take 0.5 tablets (12.5 mg total) by mouth 3 (three) times daily.   losartan 50 MG tablet Commonly known as:  COZAAR TAKE ONE (1) TABLET BY MOUTH TWO (2) TIMES DAILY   NON FORMULARY 2L of continuous oxygen   PROVENTIL HFA 108 (90 Base) MCG/ACT inhaler Generic drug:  albuterol INHALE 1 TO 2 PUFFS INTO THE LUNGS EVERY6 HOURS AS NEEDED FOR WHEEZING OR SHORTNESS OF BREATH   sildenafil 50 MG tablet Commonly known as:  VIAGRA Take 1 tablet (50 mg total) by mouth daily as needed for erectile dysfunction.   torsemide 20 MG tablet Commonly known as:  DEMADEX Take 3 tablets (60 mg total) by mouth 2 (two) times daily.

## 2017-02-08 NOTE — Patient Instructions (Signed)
Will have [employer] refit your Bipap mask and adjust the pressure setting for your machine  Follow up in 6 months

## 2017-02-15 ENCOUNTER — Other Ambulatory Visit (HOSPITAL_COMMUNITY): Payer: Self-pay

## 2017-02-15 NOTE — Progress Notes (Signed)
Paramedicine Encounter    Patient ID: Douglas [name]., male    DOB: [birth year], 32 y.o.   MRN: [id number]   Patient Care Team: Medicine, [facility] as PCP - General  Patient Active Problem List   Diagnosis Date Noted  . Morbid obesity ([place]) 06/17/2015  . Essential hypertension 04/14/2015  . Hyperkalemia 04/14/2015  . OSA (obstructive sleep apnea)   . Difficult intravenous access   . Chronic diastolic CHF (congestive heart failure), NYHA class 3 ([place]) 09/13/2014  . Cor pulmonale ([place]) 03/19/2014  . OSA treated with BiPAP     Current Outpatient Medications:  .  carvedilol (COREG) 6.25 MG tablet, Take 1.5 tablets (9.375 mg total) by mouth 2 (two) times daily with a meal., Disp: 90 tablet, Rfl: 11 .  losartan (COZAAR) 50 MG tablet, TAKE ONE (1) TABLET BY MOUTH TWO (2) TIMES DAILY, Disp: 60 tablet, Rfl: 3 .  NON FORMULARY, 2L of continuous oxygen, Disp: , Rfl:  .  PROVENTIL HFA 108 (90 Base) MCG/ACT inhaler, INHALE 1 TO 2 PUFFS INTO THE LUNGS EVERY6 HOURS AS NEEDED FOR WHEEZING OR SHORTNESS OF BREATH, Disp: 6.7 g, Rfl: 5 .  torsemide (DEMADEX) 20 MG tablet, Take 3 tablets (60 mg total) by mouth 2 (two) times daily., Disp: 180 tablet, Rfl: 3 .  hydrALAZINE (APRESOLINE) 25 MG tablet, Take 0.5 tablets (12.5 mg total) by mouth 3 (three) times daily., Disp: 135 tablet, Rfl: 6 .  sildenafil (VIAGRA) 50 MG tablet, Take 1 tablet (50 mg total) by mouth daily as needed for erectile dysfunction. (Patient not taking: Reported on 02/15/2017), Disp: 10 tablet, Rfl: 0 Allergies  Allergen Reactions  . Lisinopril Cough      Social History   Socioeconomic History  . Marital status: Single    Spouse name: Not on file  . Number of children: 2  . Years of education: Not on file  . Highest education level: Not on file  Social Needs  . Financial resource strain: Not on file  . Food insecurity - worry: Not on file  . Food insecurity - inability: Not on file  . Transportation  needs - medical: Not on file  . Transportation needs - non-medical: Not on file  Occupational History  . Occupation: N/A    Employer: NOT EMPLOYED  Tobacco Use  . Smoking status: Former Smoker    Packs/day: 1.50    Years: 3.00    Pack years: 4.50    Types: Cigarettes    Last attempt to quit: 10/31/2013    Years since quitting: 3.2  . Smokeless tobacco: Never Used  Substance and Sexual Activity  . Alcohol use: No    Alcohol/week: 0.6 oz    Types: 1 Cans of beer per week  . Drug use: No    Comment: 10/31/2013 "might use cocaine once/month" - Has not used since 2015.   Marland Kitchen Sexual activity: No  Other Topics Concern  . Not on file  Social History Narrative  . Not on file    Physical Exam  Constitutional: He is oriented to person, place, and time.  Neck: No JVD present.  Cardiovascular: Normal rate and regular rhythm.  Pulmonary/Chest: Effort normal and breath sounds normal. No respiratory distress. He has no wheezes. He has no rales. He exhibits no tenderness.  Abdominal: Soft. He exhibits no distension and no mass. There is no tenderness. There is no rebound and no guarding.  Musculoskeletal: He exhibits no edema.  Neurological: He is alert and  oriented to person, place, and time.  Skin: Skin is warm and dry.        Future Appointments  Date Time Provider [place]  02/21/2017  1:30 PM [facility] PA/NP [facility] None    BP (!) 118/100 (BP Location: Left Arm, Patient Position: Sitting, Cuff Size: Large)   Pulse 85   Wt 282 lb (127.9 kg)   SpO2 95%   BMI 48.41 kg/m   Weight yesterday-281lb Last visit weight-283lb  Douglas [name] was seen at home today and reported feeling well. He denied SOB, dizziness, headache or orthopnea. He reported being compliant with his medications except for one day and he had also not taken his medications this morning. He reported being fluid and diet compliant but does not have much of an appetite. His medications were verified and his  pillbox was refilled.  Time spent with patient: 28 minutes   Douglas [name], EMT 02/15/17  ACTION: Home visit completed Next visit planned for 1 week

## 2017-02-21 ENCOUNTER — Other Ambulatory Visit (HOSPITAL_COMMUNITY): Payer: Self-pay

## 2017-02-21 ENCOUNTER — Encounter (HOSPITAL_COMMUNITY): Payer: Medicaid Other

## 2017-02-21 NOTE — Progress Notes (Signed)
Paramedicine Encounter    Patient ID: Douglas [name]., male    DOB: 1985/06/23, 32 y.o.   MRN: [id number]   Patient Care Team: Medicine, [facility] as PCP - General  Patient Active Problem List   Diagnosis Date Noted  . Morbid obesity ([place]) 06/17/2015  . Essential hypertension 04/14/2015  . Hyperkalemia 04/14/2015  . OSA (obstructive sleep apnea)   . Difficult intravenous access   . Chronic diastolic CHF (congestive heart failure), NYHA class 3 ([place]) 09/13/2014  . Cor pulmonale ([place]) 03/19/2014  . OSA treated with BiPAP     Current Outpatient Medications:  .  carvedilol (COREG) 6.25 MG tablet, Take 1.5 tablets (9.375 mg total) by mouth 2 (two) times daily with a meal., Disp: 90 tablet, Rfl: 11 .  hydrALAZINE (APRESOLINE) 25 MG tablet, Take 0.5 tablets (12.5 mg total) by mouth 3 (three) times daily., Disp: 135 tablet, Rfl: 6 .  losartan (COZAAR) 50 MG tablet, TAKE ONE (1) TABLET BY MOUTH TWO (2) TIMES DAILY, Disp: 60 tablet, Rfl: 3 .  NON FORMULARY, 2L of continuous oxygen, Disp: , Rfl:  .  PROVENTIL HFA 108 (90 Base) MCG/ACT inhaler, INHALE 1 TO 2 PUFFS INTO THE LUNGS EVERY6 HOURS AS NEEDED FOR WHEEZING OR SHORTNESS OF BREATH, Disp: 6.7 g, Rfl: 5 .  torsemide (DEMADEX) 20 MG tablet, Take 3 tablets (60 mg total) by mouth 2 (two) times daily., Disp: 180 tablet, Rfl: 3 .  sildenafil (VIAGRA) 50 MG tablet, Take 1 tablet (50 mg total) by mouth daily as needed for erectile dysfunction. (Patient not taking: Reported on 02/15/2017), Disp: 10 tablet, Rfl: 0 Allergies  Allergen Reactions  . Lisinopril Cough      Social History   Socioeconomic History  . Marital status: Single    Spouse name: Not on file  . Number of children: 2  . Years of education: Not on file  . Highest education level: Not on file  Social Needs  . Financial resource strain: Not on file  . Food insecurity - worry: Not on file  . Food insecurity - inability: Not on file  . Transportation  needs - medical: Not on file  . Transportation needs - non-medical: Not on file  Occupational History  . Occupation: N/A    Employer: NOT EMPLOYED  Tobacco Use  . Smoking status: Former Smoker    Packs/day: 1.50    Years: 3.00    Pack years: 4.50    Types: Cigarettes    Last attempt to quit: 10/31/2013    Years since quitting: 3.3  . Smokeless tobacco: Never Used  Substance and Sexual Activity  . Alcohol use: No    Alcohol/week: 0.6 oz    Types: 1 Cans of beer per week  . Drug use: No    Comment: 10/31/2013 "might use cocaine once/month" - Has not used since 2015.   Marland Kitchen Sexual activity: No  Other Topics Concern  . Not on file  Social History Narrative  . Not on file    Physical Exam  Constitutional: He is oriented to person, place, and time.  Cardiovascular: Normal rate and regular rhythm.  Pulmonary/Chest: Effort normal and breath sounds normal. No respiratory distress. He has no wheezes. He has no rales.  Abdominal: Soft. He exhibits no distension.  Musculoskeletal: Normal range of motion. He exhibits no edema.  Neurological: He is alert and oriented to person, place, and time.  Skin: Skin is warm and dry.  Psychiatric: He has a normal  mood and affect.        Future Appointments  Date Time Provider [place]  03/01/2017  1:30 PM [facility] PA/NP [facility] None    BP 108/78 (BP Location: Left Arm, Patient Position: Sitting, Cuff Size: Large)   Pulse 84   Resp 18   Wt 279 lb 8 oz (126.8 kg)   SpO2 95%   BMI 47.98 kg/m   Weight yesterday- 279 lb Last visit weight- 282 lb  Douglas [name] was seen at home today and reported feeling well. He stated he had an episode of dizziness over the weekend. He stated these episodes happen after he sleeps for long periods at a time. He also said he has been having trouble sleeping and sometimes stays awake until 05:00. We are in the process of getting him set up with a PCP so he can explore medication to help him sleep. We are  also working on finding employment for Douglas [name]. His medications were verified and his pillbox was refilled.   Time spent with patient: 48 minutes  Douglas [name], EMT 02/21/17  ACTION: Home visit completed Next visit planned for 1 week

## 2017-03-01 ENCOUNTER — Other Ambulatory Visit (HOSPITAL_COMMUNITY): Payer: Self-pay

## 2017-03-01 ENCOUNTER — Encounter (HOSPITAL_COMMUNITY): Payer: Medicaid Other

## 2017-03-01 NOTE — Progress Notes (Signed)
Paramedicine Encounter    Patient ID: [name], male    DOB: 08-21-1985, 32 y.o.   MRN: [id number]   Patient Care Team: Medicine, [facility] as PCP - General  Patient Active Problem List   Diagnosis Date Noted  . Morbid obesity ([place]) 06/17/2015  . Essential hypertension 04/14/2015  . Hyperkalemia 04/14/2015  . OSA (obstructive sleep apnea)   . Difficult intravenous access   . Chronic diastolic CHF (congestive heart failure), NYHA class 3 ([place]) 09/13/2014  . Cor pulmonale ([place]) 03/19/2014  . OSA treated with BiPAP     Current Outpatient Medications:  .  carvedilol (COREG) 6.25 MG tablet, Take 1.5 tablets (9.375 mg total) by mouth 2 (two) times daily with a meal., Disp: 90 tablet, Rfl: 11 .  hydrALAZINE (APRESOLINE) 25 MG tablet, Take 0.5 tablets (12.5 mg total) by mouth 3 (three) times daily., Disp: 135 tablet, Rfl: 6 .  losartan (COZAAR) 50 MG tablet, TAKE ONE (1) TABLET BY MOUTH TWO (2) TIMES DAILY, Disp: 60 tablet, Rfl: 3 .  NON FORMULARY, 2L of continuous oxygen, Disp: , Rfl:  .  PROVENTIL HFA 108 (90 Base) MCG/ACT inhaler, INHALE 1 TO 2 PUFFS INTO THE LUNGS EVERY6 HOURS AS NEEDED FOR WHEEZING OR SHORTNESS OF BREATH, Disp: 6.7 g, Rfl: 5 .  torsemide (DEMADEX) 20 MG tablet, Take 3 tablets (60 mg total) by mouth 2 (two) times daily., Disp: 180 tablet, Rfl: 3 .  sildenafil (VIAGRA) 50 MG tablet, Take 1 tablet (50 mg total) by mouth daily as needed for erectile dysfunction. (Patient not taking: Reported on 02/15/2017), Disp: 10 tablet, Rfl: 0 Allergies  Allergen Reactions  . Lisinopril Cough      Social History   Socioeconomic History  . Marital status: Single    Spouse name: Not on file  . Number of children: 2  . Years of education: Not on file  . Highest education level: Not on file  Social Needs  . Financial resource strain: Not on file  . Food insecurity - worry: Not on file  . Food insecurity - inability: Not on file  . Transportation  needs - medical: Not on file  . Transportation needs - non-medical: Not on file  Occupational History  . Occupation: N/A    Employer: NOT EMPLOYED  Tobacco Use  . Smoking status: Former Smoker    Packs/day: 1.50    Years: 3.00    Pack years: 4.50    Types: Cigarettes    Last attempt to quit: 10/31/2013    Years since quitting: 3.3  . Smokeless tobacco: Never Used  Substance and Sexual Activity  . Alcohol use: No    Alcohol/week: 0.6 oz    Types: 1 Cans of beer per week  . Drug use: No    Comment: 10/31/2013 "might use cocaine once/month" - Has not used since 2015.   Marland Kitchen Sexual activity: No  Other Topics Concern  . Not on file  Social History Narrative  . Not on file    Physical Exam  Constitutional: He is oriented to person, place, and time.  Cardiovascular: Normal rate and regular rhythm.  Pulmonary/Chest: Effort normal and breath sounds normal. No respiratory distress. He has no wheezes. He has no rales.  Abdominal: Soft.  Musculoskeletal: Normal range of motion.       Right ankle: He exhibits swelling.  Neurological: He is alert and oriented to person, place, and time.  Skin: Skin is warm and dry.  Psychiatric: He  has a normal mood and affect.        No future appointments.  BP 110/72 (BP Location: Right Arm, Patient Position: Sitting, Cuff Size: Large)   Pulse 78   Resp 16   Wt 278 lb (126.1 kg)   SpO2 94%   BMI 47.72 kg/m   Weight yesterday- 279 lb Last visit weight- 279 lb  Mr Caradine was seen at home and reported feeling well. He denied SOB, headache, dizziness or orthopnea. He was complaining of right foot pain and swelling which has been ongoing for the past 2 days. He reported it feels like a flare up of gout. He has not been treated gout recently because he does not have a PCP however during this visit, I completed a "New Patient" package for [facility] and took it to the office. He now has an appointment next week. His  medications were verified and his pillbox was refilled.   Time spent wit patient: 59 minutes  [name], EMT 03/01/17  ACTION: Home visit completed Next visit planned for 1 week

## 2017-03-07 ENCOUNTER — Encounter (HOSPITAL_COMMUNITY): Payer: Self-pay

## 2017-03-07 NOTE — Progress Notes (Signed)
Social security claim mailed with provided envelope and supporting documentation to stamped address  [address] [place] [address] Copy of forms scanned into patient's electronic medical record.  Renee Pain, RN

## 2017-03-08 ENCOUNTER — Ambulatory Visit (HOSPITAL_COMMUNITY)
Admission: EM | Admit: 2017-03-08 | Discharge: 2017-03-08 | Disposition: A | Discharge status: Home / Self Care | Payer: Medicaid Other | Attending: Family Medicine | Admitting: Family Medicine

## 2017-03-08 ENCOUNTER — Other Ambulatory Visit: Payer: Self-pay

## 2017-03-08 ENCOUNTER — Encounter (HOSPITAL_COMMUNITY): Payer: Self-pay | Admitting: Emergency Medicine

## 2017-03-08 DIAGNOSIS — M109 Gout, unspecified: Secondary | ICD-10-CM | POA: Diagnosis not present

## 2017-03-08 MED ORDER — IBUPROFEN 800 MG PO TABS
ORAL_TABLET | ORAL | Status: AC
  Filled 2017-03-08: qty 1

## 2017-03-08 MED ORDER — IBUPROFEN 800 MG PO TABS
800.0000 mg | ORAL_TABLET | Freq: Once | ORAL | Status: AC
  Administered 2017-03-08: 800 mg via ORAL

## 2017-03-08 MED ORDER — COLCHICINE 0.6 MG PO TABS: ORAL_TABLET | ORAL | 0 refills | Status: DC

## 2017-03-08 MED ORDER — IBUPROFEN 600 MG PO TABS: 600.0000 mg | ORAL_TABLET | Freq: Four times a day (QID) | ORAL | 0 refills | Status: DC | PRN

## 2017-03-08 NOTE — ED Triage Notes (Signed)
Pt c/o gout pain in his foot x2 weeks. Pt has heart failure and has his o2 tank with him which ran out of battery.

## 2017-03-09 ENCOUNTER — Ambulatory Visit (HOSPITAL_COMMUNITY)
Admission: RE | Admit: 2017-03-09 | Discharge: 2017-03-09 | Disposition: A | Discharge status: Home / Self Care | Payer: Medicaid Other | Source: Ambulatory Visit | Attending: Internal Medicine | Admitting: Internal Medicine

## 2017-03-09 ENCOUNTER — Encounter (HOSPITAL_COMMUNITY): Payer: Self-pay

## 2017-03-09 ENCOUNTER — Other Ambulatory Visit (HOSPITAL_COMMUNITY): Payer: Self-pay

## 2017-03-09 VITALS — BP 132/81 | HR 80 | Wt 282.0 lb

## 2017-03-09 DIAGNOSIS — I5032 Chronic diastolic (congestive) heart failure: Secondary | ICD-10-CM | POA: Insufficient documentation

## 2017-03-09 DIAGNOSIS — I5030 Unspecified diastolic (congestive) heart failure: Secondary | ICD-10-CM | POA: Diagnosis present

## 2017-03-09 DIAGNOSIS — M1990 Unspecified osteoarthritis, unspecified site: Secondary | ICD-10-CM | POA: Insufficient documentation

## 2017-03-09 DIAGNOSIS — I50812 Chronic right heart failure: Secondary | ICD-10-CM | POA: Insufficient documentation

## 2017-03-09 DIAGNOSIS — Z87891 Personal history of nicotine dependence: Secondary | ICD-10-CM | POA: Insufficient documentation

## 2017-03-09 DIAGNOSIS — Z79899 Other long term (current) drug therapy: Secondary | ICD-10-CM | POA: Insufficient documentation

## 2017-03-09 DIAGNOSIS — E662 Morbid (severe) obesity with alveolar hypoventilation: Secondary | ICD-10-CM | POA: Insufficient documentation

## 2017-03-09 DIAGNOSIS — Z8249 Family history of ischemic heart disease and other diseases of the circulatory system: Secondary | ICD-10-CM | POA: Insufficient documentation

## 2017-03-09 DIAGNOSIS — G4733 Obstructive sleep apnea (adult) (pediatric): Secondary | ICD-10-CM | POA: Diagnosis not present

## 2017-03-09 DIAGNOSIS — E7800 Pure hypercholesterolemia, unspecified: Secondary | ICD-10-CM | POA: Insufficient documentation

## 2017-03-09 DIAGNOSIS — Z6841 Body Mass Index (BMI) 40.0 and over, adult: Secondary | ICD-10-CM | POA: Insufficient documentation

## 2017-03-09 DIAGNOSIS — I110 Hypertensive heart disease with heart failure: Secondary | ICD-10-CM | POA: Insufficient documentation

## 2017-03-09 DIAGNOSIS — I272 Pulmonary hypertension, unspecified: Secondary | ICD-10-CM | POA: Diagnosis not present

## 2017-03-09 NOTE — Progress Notes (Signed)
Paramedicine Encounter   Patient ID: Douglas [name] , male,   DOB: [birth year],31 y.o.,  MRN: [id number]   Douglas [name] was seen in the clinic today and reported feeling well. He said he has some relief from his gout flare up after being seen at an urgent care. He was placed on colchicine and ibuprofen. He has a PCP appointment on Feb 25 at [facility]. He brought his medications but did not bring his pillbox for me to refill. We need to contact [employer] about getting a new mask and hose for his CPAP. No medication changes at this time.   Time spent with patient: 22 minutes  Douglas [name], EMT 03/09/2017   ACTION: Next visit planned for 1 week

## 2017-03-09 NOTE — Progress Notes (Signed)
Patient ID: [name], male   DOB: May 25, 1985, 32 y.o.   MRN: [id number]    Advanced Heart Failure Clinic Note   PCP: none.  Primary Cardiologist: Dr Douglas [name]  Pulmonary  HPI: Douglas [name] is a 32 yo with OHS/OSA, morbid obesity, HTN, diastolic CHF with prominent RV failure.   Admitted in August 2016  to [facility] with volume overload. Diuresed with lasix drip and diamox. Transitioned to lasix 80 mg twice a day. Discharge weight 257 pounds.   Recent admit to [facility] 3/21 through 04/28/15. Admitted with respiratory distress. Required short term intubation. Apparently had not been wearing BiPap. Discharge weight was 259 pounds.   Today he returns for HF follow up. Overall feeling fine. Denies SOB/PND/Orthopnea. Does admit to SOB with moderate activity. SOB with stairs.  Appetite ok. No fever or chills. Weight at home 276- 279 pounds. Taking all medications. Followed by Paramedicine.   - ECHO 03/20/2014 EF 60-65% - ECHO 8/18: EF 55-60%, grade II diastolic dydsfunction, D-shaped septum suggesting RV pressure/volume overload, severe RV dilation with mild-moderately decreased RV systolic function, unable to estimate PA pressure.   Labs 09/21/2014: K 3.9 Creatinine 1.03  Labs 11/26/2014: K 4.2 Creatinine 1.08  Labs 12/30/2014: K 4.7 Creatinine 1.05  Labs 04/26/2015: K 4.0 Creatinine 0.94  Labs 10/17: K 4.1, creatinine 1.17 Labs 8/18: K 4.4, creatinine 1.26 Labs 11/21/2016: K 4.2 Creatinine 1.25   ROS: All systems negative except as listed in HPI, PMH and Problem List.  SH:  Social History   Socioeconomic History  . Marital status: Single    Spouse name: Not on file  . Number of children: 2  . Years of education: Not on file  . Highest education level: Not on file  Social Needs  . Financial resource strain: Not on file  . Food insecurity - worry: Not on file  . Food insecurity - inability: Not on file  . Transportation needs - medical: Not on file  . Transportation needs - non-medical: Not  on file  Occupational History  . Occupation: N/A    Employer: NOT EMPLOYED  Tobacco Use  . Smoking status: Former Smoker    Packs/day: 1.50    Years: 3.00    Pack years: 4.50    Types: Cigarettes    Last attempt to quit: 10/31/2013    Years since quitting: 3.3  . Smokeless tobacco: Never Used  Substance and Sexual Activity  . Alcohol use: No    Alcohol/week: 0.6 oz    Types: 1 Cans of beer per week  . Drug use: No    Comment: 10/31/2013 "might use cocaine once/month" - Has not used since 2015.   Marland Kitchen Sexual activity: No  Other Topics Concern  . Not on file  Social History Narrative  . Not on file    FH:  Family History  Problem Relation Age of Onset  . Heart disease Mother        Pacemaker  . Hypertension Father   . Cancer Paternal Grandfather   . Heart attack Neg Hx   . Stroke Neg Hx     Past Medical History:  Diagnosis Date  . Arthritis    "hands, feet" (10/31/2013)  . CHF (congestive heart failure) ([place])   . Chronic bronchitis ([place])   . High cholesterol   . Hypertension   . Obesity   . OSA on CPAP   . Pneumonia 09/2011  . Substance abuse ([place]) 8/31/213   cociane "first time use"  .  Tobacco abuse     Current Outpatient Medications  Medication Sig Dispense Refill  . carvedilol (COREG) 6.25 MG tablet Take 1.5 tablets (9.375 mg total) by mouth 2 (two) times daily with a meal. 90 tablet 11  . colchicine 0.6 MG tablet Take two tablets as one dose followed one hour later by one tablet. 3 tablet 0  . hydrALAZINE (APRESOLINE) 25 MG tablet Take 0.5 tablets (12.5 mg total) by mouth 3 (three) times daily. 135 tablet 6  . ibuprofen (ADVIL,MOTRIN) 600 MG tablet Take 1 tablet (600 mg total) by mouth every 6 (six) hours as needed. 21 tablet 0  . losartan (COZAAR) 50 MG tablet TAKE ONE (1) TABLET BY MOUTH TWO (2) TIMES DAILY 60 tablet 3  . NON FORMULARY 2L of continuous oxygen    . PROVENTIL HFA 108 (90 Base) MCG/ACT inhaler INHALE 1 TO 2 PUFFS INTO THE LUNGS EVERY6 HOURS AS  NEEDED FOR WHEEZING OR SHORTNESS OF BREATH 6.7 g 5  . sildenafil (VIAGRA) 50 MG tablet Take 1 tablet (50 mg total) by mouth daily as needed for erectile dysfunction. 10 tablet 0  . torsemide (DEMADEX) 20 MG tablet Take 3 tablets (60 mg total) by mouth 2 (two) times daily. 180 tablet 3   No current facility-administered medications for this encounter.     Vitals:   03/09/17 1519  BP: 132/81  Pulse: 80  SpO2: 92%  Weight: 282 lb (127.9 kg)   Wt Readings from Last 3 Encounters:  03/09/17 282 lb (127.9 kg)  03/01/17 278 lb (126.1 kg)  02/21/17 279 lb 8 oz (126.8 kg)     PHYSICAL EXAM: General:  Well appearing. No resp difficulty HEENT: normal Neck: supple. no JVD. Carotids 2+ bilat; no bruits. No lymphadenopathy or thryomegaly appreciated. Cor: PMI nondisplaced. Regular rate & rhythm. No rubs, gallops or murmurs. Lungs: clear on 3 liters oxygen.  Abdomen: obese, soft, nontender, nondistended. No hepatosplenomegaly. No bruits or masses. Good bowel sounds. Extremities: no cyanosis, clubbing, rash, edema Neuro: alert & orientedx3, cranial nerves grossly intact. moves all 4 extremities w/o difficulty. Affect pleasant   ASSESSMENT & PLAN:  1.Chronic diastolic CHF with prominent RV failure: Echo 08/30/16 with D shaped septum and severely enlarged RV. RV failure in the setting of OSA and OHS.  - Volume status stable. Continue torsmied 60 mg twice a day.  - Continue losartan 50 mg BID - Continue Coreg 9.375 mg BID - No Spiro with history of hyperkalemia.   2. OHS/OSA:  Needs to use CPAP nightly. Needs to call and get repaired.   3. HTN:   Stable continue current regimen.  - Well controlled on current regimen.    4. RV failure:  - VQ scan without PE.  - Continue Oxygen.    Follow up 3-4 months.    Douglas Kisner NP-C  03/09/2017

## 2017-03-09 NOTE — Patient Instructions (Signed)
No changes at this time.  Follow up 3 months.  ______________________________________________________________ [place] Code: 8864  Take all medication as prescribed the day of your appointment. Bring all medications with you to your appointment.  Do the following things EVERYDAY: 1) Weigh yourself in the morning before breakfast. Write it down and keep it in a log. 2) Take your medicines as prescribed 3) Eat low salt foods-Limit salt (sodium) to 2000 mg per day.  4) Stay as active as you can everyday 5) Limit all fluids for the day to less than 2 liters

## 2017-03-14 ENCOUNTER — Other Ambulatory Visit (HOSPITAL_COMMUNITY): Payer: Self-pay

## 2017-03-14 NOTE — Progress Notes (Signed)
Paramedicine Encounter    Patient ID: Douglas Madura., male    DOB: Oct 16, 1985, 32 y.o.   MRN: [id number]   Patient Care Team: Medicine, [facility] as PCP - General  Patient Active Problem List   Diagnosis Date Noted  . Morbid obesity ([place]) 06/17/2015  . Essential hypertension 04/14/2015  . Hyperkalemia 04/14/2015  . OSA (obstructive sleep apnea)   . Difficult intravenous access   . Chronic diastolic CHF (congestive heart failure), NYHA class 3 ([place]) 09/13/2014  . Cor pulmonale ([place]) 03/19/2014  . OSA treated with BiPAP     Current Outpatient Medications:  .  carvedilol (COREG) 6.25 MG tablet, Take 1.5 tablets (9.375 mg total) by mouth 2 (two) times daily with a meal., Disp: 90 tablet, Rfl: 11 .  hydrALAZINE (APRESOLINE) 25 MG tablet, Take 0.5 tablets (12.5 mg total) by mouth 3 (three) times daily., Disp: 135 tablet, Rfl: 6 .  losartan (COZAAR) 50 MG tablet, TAKE ONE (1) TABLET BY MOUTH TWO (2) TIMES DAILY, Disp: 60 tablet, Rfl: 3 .  NON FORMULARY, 2L of continuous oxygen, Disp: , Rfl:  .  PROVENTIL HFA 108 (90 Base) MCG/ACT inhaler, INHALE 1 TO 2 PUFFS INTO THE LUNGS EVERY6 HOURS AS NEEDED FOR WHEEZING OR SHORTNESS OF BREATH, Disp: 6.7 g, Rfl: 5 .  torsemide (DEMADEX) 20 MG tablet, Take 3 tablets (60 mg total) by mouth 2 (two) times daily., Disp: 180 tablet, Rfl: 3 .  colchicine 0.6 MG tablet, Take two tablets as one dose followed one hour later by one tablet. (Patient not taking: Reported on 03/14/2017), Disp: 3 tablet, Rfl: 0 .  ibuprofen (ADVIL,MOTRIN) 600 MG tablet, Take 1 tablet (600 mg total) by mouth every 6 (six) hours as needed. (Patient not taking: Reported on 03/14/2017), Disp: 21 tablet, Rfl: 0 .  sildenafil (VIAGRA) 50 MG tablet, Take 1 tablet (50 mg total) by mouth daily as needed for erectile dysfunction. (Patient not taking: Reported on 03/14/2017), Disp: 10 tablet, Rfl: 0 Allergies  Allergen Reactions  . Lisinopril Cough      Social History    Socioeconomic History  . Marital status: Single    Spouse name: Not on file  . Number of children: 2  . Years of education: Not on file  . Highest education level: Not on file  Social Needs  . Financial resource strain: Not on file  . Food insecurity - worry: Not on file  . Food insecurity - inability: Not on file  . Transportation needs - medical: Not on file  . Transportation needs - non-medical: Not on file  Occupational History  . Occupation: N/A    Employer: NOT EMPLOYED  Tobacco Use  . Smoking status: Former Smoker    Packs/day: 1.50    Years: 3.00    Pack years: 4.50    Types: Cigarettes    Last attempt to quit: 10/31/2013    Years since quitting: 3.3  . Smokeless tobacco: Never Used  Substance and Sexual Activity  . Alcohol use: No    Alcohol/week: 0.6 oz    Types: 1 Cans of beer per week  . Drug use: No    Comment: 10/31/2013 "might use cocaine once/month" - Has not used since 2015.   Marland Kitchen Sexual activity: No  Other Topics Concern  . Not on file  Social History Narrative  . Not on file    Physical Exam  Constitutional: He is oriented to person, place, and time.  Cardiovascular: Normal rate and regular  rhythm.  Pulmonary/Chest: Effort normal and breath sounds normal. No respiratory distress. He has no wheezes. He has no rales.  Abdominal: He exhibits no distension.  Musculoskeletal: Normal range of motion. He exhibits no edema.  Neurological: He is alert and oriented to person, place, and time.  Skin: Skin is warm and dry.  Psychiatric: He has a normal mood and affect.        Future Appointments  Date Time Provider [place]  06/06/2017  3:00 PM [facility] PA/NP [facility] None    BP 100/78 (BP Location: Left Arm, Patient Position: Sitting, Cuff Size: Large)   Pulse 79   Resp 18   Wt 275 lb (124.7 kg)   SpO2 96%   BMI 47.20 kg/m   Weight yesterday- 273 lb Last visit weight- 282 lb  Mr Strollo was seen at home today and reported feeling  well. He denied SOB, headache, dizziness or orthopnea. He contacted [employer] last week and has someone scheduled to come to his house and fix his CPAP mask and tubing. He is currently concerned about his power bill being past due. I contacted the clinic and secured information about utility assistance and will bring that to him tomorrow. He also needs food assistance which I will provide when I visit tomorrow.   Time spent with patient: 41 minutes   Douglas [name], EMT 03/14/17  ACTION: Home visit completed Next visit planned for 1 week

## 2017-03-15 NOTE — ED Provider Notes (Signed)
[place]   [id number] 03/08/17 Arrival Time: 12  ASSESSMENT & PLAN:  1. Acute gout involving toe of right foot, unspecified cause     Meds ordered this encounter  Medications  . colchicine 0.6 MG tablet    Sig: Take two tablets as one dose followed one hour later by one tablet.    Dispense:  3 tablet    Refill:  0  . ibuprofen (ADVIL,MOTRIN) 600 MG tablet    Sig: Take 1 tablet (600 mg total) by mouth every 6 (six) hours as needed.    Dispense:  21 tablet    Refill:  0  . ibuprofen (ADVIL,MOTRIN) tablet 800 mg   May f/u with PCP or here as needed. Reviewed expectations re: course of current medical issues. Questions answered. Outlined signs and symptoms indicating need for more acute intervention. Patient verbalized understanding. After Visit Summary given.  SUBJECTIVE: History from: patient. Douglas [name]. is a 32 y.o. male who reports localized moderate pain of his right great toe/foot that is stable; fairly persistent; described as aching and hot without radiation. Onset: abrupt, at age 59 weeks ago. Injury/trama: no. Relieved by: ibuprofen with mild help. Worsened by: certain movements. Associated symptoms: none reported. Extremity sensation changes or weakness: none. History of similar: yes, gout; "and it feels like gout"  ROS: As per HPI.   OBJECTIVE:  Vitals:   03/08/17 1837  BP: 132/90  Pulse: 78  Resp: 18  Temp: 99.2 F (37.3 C)  SpO2: 90%    General appearance: alert; no distress Extremities: no cyanosis or edema; symmetrical with no gross deformities; localized tenderness over his right great toe at MTP joint with mild swelling and no bruising; ROM: normal CV: normal extremity capillary refill Skin: warm and dry Neurologic: normal gait; normal symmetric reflexes in all extremities; normal sensation in all extremities Psychological: alert and cooperative; normal mood and affect  Allergies  Allergen Reactions  . Lisinopril  Cough    Past Medical History:  Diagnosis Date  . Arthritis    "hands, feet" (10/31/2013)  . CHF (congestive heart failure) ([place])   . Chronic bronchitis ([place])   . High cholesterol   . Hypertension   . Obesity   . OSA on CPAP   . Pneumonia 09/2011  . Substance abuse ([place]) 8/31/213   cociane "first time use"  . Tobacco abuse    Social History   Socioeconomic History  . Marital status: Single    Spouse name: Not on file  . Number of children: 2  . Years of education: Not on file  . Highest education level: Not on file  Social Needs  . Financial resource strain: Not on file  . Food insecurity - worry: Not on file  . Food insecurity - inability: Not on file  . Transportation needs - medical: Not on file  . Transportation needs - non-medical: Not on file  Occupational History  . Occupation: N/A    Employer: NOT EMPLOYED  Tobacco Use  . Smoking status: Former Smoker    Packs/day: 1.50    Years: 3.00    Pack years: 4.50    Types: Cigarettes    Last attempt to quit: 10/31/2013    Years since quitting: 3.3  . Smokeless tobacco: Never Used  Substance and Sexual Activity  . Alcohol use: No    Alcohol/week: 0.6 oz    Types: 1 Cans of beer per week  . Drug use: No    Comment: 10/31/2013 "might  use cocaine once/month" - Has not used since 2015.   Marland Kitchen Sexual activity: No  Other Topics Concern  . Not on file  Social History Narrative  . Not on file   Family History  Problem Relation Age of Onset  . Heart disease Mother        Pacemaker  . Hypertension Father   . Cancer Paternal Grandfather   . Heart attack Neg Hx   . Stroke Neg Hx    Past Surgical History:  Procedure Laterality Date  . NO PAST SURGERIES    . TUBES IN Jola Babinski, MD 03/15/17 [phone]

## 2017-03-16 ENCOUNTER — Other Ambulatory Visit (HOSPITAL_COMMUNITY): Payer: Self-pay

## 2017-03-16 NOTE — Progress Notes (Signed)
Douglas [name] was seen at home today and reported feeling well. The purpose of this visit was to obtain paperwork which he received in the mail which stated his Medicaid benefit was being stopped at the end of February. I will contact the case worker and attempt to resolve the issue.  Time sent to patient: 20 minutes

## 2017-03-21 ENCOUNTER — Other Ambulatory Visit (HOSPITAL_COMMUNITY): Payer: Self-pay

## 2017-03-21 NOTE — Progress Notes (Signed)
Paramedicine Encounter    Patient ID: Douglas [name]., male    DOB: 15-Sep-1985, 32 y.o.   MRN: [id number]   Patient Care Team: Medicine, [facility] as PCP - General  Patient Active Problem List   Diagnosis Date Noted  . Morbid obesity ([place]) 06/17/2015  . Essential hypertension 04/14/2015  . Hyperkalemia 04/14/2015  . OSA (obstructive sleep apnea)   . Difficult intravenous access   . Chronic diastolic CHF (congestive heart failure), NYHA class 3 ([place]) 09/13/2014  . Cor pulmonale ([place]) 03/19/2014  . OSA treated with BiPAP     Current Outpatient Medications:  .  carvedilol (COREG) 6.25 MG tablet, Take 1.5 tablets (9.375 mg total) by mouth 2 (two) times daily with a meal., Disp: 90 tablet, Rfl: 11 .  colchicine 0.6 MG tablet, Take two tablets as one dose followed one hour later by one tablet. (Patient not taking: Reported on 03/14/2017), Disp: 3 tablet, Rfl: 0 .  hydrALAZINE (APRESOLINE) 25 MG tablet, Take 0.5 tablets (12.5 mg total) by mouth 3 (three) times daily., Disp: 135 tablet, Rfl: 6 .  ibuprofen (ADVIL,MOTRIN) 600 MG tablet, Take 1 tablet (600 mg total) by mouth every 6 (six) hours as needed. (Patient not taking: Reported on 03/14/2017), Disp: 21 tablet, Rfl: 0 .  losartan (COZAAR) 50 MG tablet, TAKE ONE (1) TABLET BY MOUTH TWO (2) TIMES DAILY, Disp: 60 tablet, Rfl: 3 .  NON FORMULARY, 2L of continuous oxygen, Disp: , Rfl:  .  PROVENTIL HFA 108 (90 Base) MCG/ACT inhaler, INHALE 1 TO 2 PUFFS INTO THE LUNGS EVERY6 HOURS AS NEEDED FOR WHEEZING OR SHORTNESS OF BREATH, Disp: 6.7 g, Rfl: 5 .  sildenafil (VIAGRA) 50 MG tablet, Take 1 tablet (50 mg total) by mouth daily as needed for erectile dysfunction. (Patient not taking: Reported on 03/14/2017), Disp: 10 tablet, Rfl: 0 .  torsemide (DEMADEX) 20 MG tablet, Take 3 tablets (60 mg total) by mouth 2 (two) times daily., Disp: 180 tablet, Rfl: 3 Allergies  Allergen Reactions  . Lisinopril Cough      Social History    Socioeconomic History  . Marital status: Single    Spouse name: Not on file  . Number of children: 2  . Years of education: Not on file  . Highest education level: Not on file  Social Needs  . Financial resource strain: Not on file  . Food insecurity - worry: Not on file  . Food insecurity - inability: Not on file  . Transportation needs - medical: Not on file  . Transportation needs - non-medical: Not on file  Occupational History  . Occupation: N/A    Employer: NOT EMPLOYED  Tobacco Use  . Smoking status: Former Smoker    Packs/day: 1.50    Years: 3.00    Pack years: 4.50    Types: Cigarettes    Last attempt to quit: 10/31/2013    Years since quitting: 3.3  . Smokeless tobacco: Never Used  Substance and Sexual Activity  . Alcohol use: No    Alcohol/week: 0.6 oz    Types: 1 Cans of beer per week  . Drug use: No    Comment: 10/31/2013 "might use cocaine once/month" - Has not used since 2015.   Marland Kitchen Sexual activity: No  Other Topics Concern  . Not on file  Social History Narrative  . Not on file    Physical Exam  Constitutional: He is oriented to person, place, and time.  Cardiovascular: Normal rate and regular  rhythm.  Pulmonary/Chest: Effort normal and breath sounds normal. No respiratory distress. He has no wheezes. He has no rales.  Musculoskeletal: Normal range of motion. He exhibits no edema.  Neurological: He is alert and oriented to person, place, and time.  Skin: Skin is warm and dry.  Psychiatric: He has a normal mood and affect.        Future Appointments  Date Time Provider [place]  06/06/2017  3:00 PM [facility] PA/NP [facility] None    BP 118/80 (BP Location: Left Arm, Patient Position: Sitting, Cuff Size: Large)   Pulse 75   Resp 16   Wt 271 lb (122.9 kg)   SpO2 94%   BMI 46.52 kg/m   Weight yesterday- 273 lb Last visit weight- 275 lb   Mr Jarchow was seen at home today and reported feeling generally well. He denied SOB, headache,  dizziness or orthopnea. He is still concerned about his SSI and Medicaid but I have spoken to his Social Services case worker and relayed the information to [place]. He is going to get the necessary paperwork from the [place] office tomorrow and will turn it in. His medications were verified and his pillbox was refilled.   Time spent with patient: 35 minutes   [name], EMT 03/21/17  ACTION: Home visit completed Next visit planned for 1 week

## 2017-03-22 ENCOUNTER — Encounter (HOSPITAL_COMMUNITY): Payer: Self-pay

## 2017-03-22 NOTE — Progress Notes (Signed)
[name] CHF paramedic in clinic reporting patient states SSI office never received mailed packet sent out 03/07/2017. Another copy of forms made with supporting/requested documentation and sent with [place] who will personally deliver packet for patient. Original forms placed back into outgoing folder to be scanned into patient's electronic medical record.  Renee Pain, RN

## 2017-03-28 ENCOUNTER — Other Ambulatory Visit (HOSPITAL_COMMUNITY): Payer: Self-pay

## 2017-03-28 NOTE — Progress Notes (Signed)
Paramedicine Encounter    Patient ID: Douglas [name]., male    DOB: 05-24-1985, 32 y.o.   MRN: [id number]   Patient Care Team: Medicine, [facility] as PCP - General  Patient Active Problem List   Diagnosis Date Noted  . Morbid obesity ([place]) 06/17/2015  . Essential hypertension 04/14/2015  . Hyperkalemia 04/14/2015  . OSA (obstructive sleep apnea)   . Difficult intravenous access   . Chronic diastolic CHF (congestive heart failure), NYHA class 3 ([place]) 09/13/2014  . Cor pulmonale ([place]) 03/19/2014  . OSA treated with BiPAP     Current Outpatient Medications:  .  carvedilol (COREG) 6.25 MG tablet, Take 1.5 tablets (9.375 mg total) by mouth 2 (two) times daily with a meal., Disp: 90 tablet, Rfl: 11 .  hydrALAZINE (APRESOLINE) 25 MG tablet, Take 0.5 tablets (12.5 mg total) by mouth 3 (three) times daily., Disp: 135 tablet, Rfl: 6 .  losartan (COZAAR) 50 MG tablet, TAKE ONE (1) TABLET BY MOUTH TWO (2) TIMES DAILY, Disp: 60 tablet, Rfl: 3 .  NON FORMULARY, 2L of continuous oxygen, Disp: , Rfl:  .  PROVENTIL HFA 108 (90 Base) MCG/ACT inhaler, INHALE 1 TO 2 PUFFS INTO THE LUNGS EVERY6 HOURS AS NEEDED FOR WHEEZING OR SHORTNESS OF BREATH, Disp: 6.7 g, Rfl: 5 .  torsemide (DEMADEX) 20 MG tablet, Take 3 tablets (60 mg total) by mouth 2 (two) times daily., Disp: 180 tablet, Rfl: 3 .  colchicine 0.6 MG tablet, Take two tablets as one dose followed one hour later by one tablet. (Patient not taking: Reported on 03/14/2017), Disp: 3 tablet, Rfl: 0 .  ibuprofen (ADVIL,MOTRIN) 600 MG tablet, Take 1 tablet (600 mg total) by mouth every 6 (six) hours as needed. (Patient not taking: Reported on 03/28/2017), Disp: 21 tablet, Rfl: 0 .  sildenafil (VIAGRA) 50 MG tablet, Take 1 tablet (50 mg total) by mouth daily as needed for erectile dysfunction. (Patient not taking: Reported on 03/14/2017), Disp: 10 tablet, Rfl: 0 Allergies  Allergen Reactions  . Lisinopril Cough      Social History    Socioeconomic History  . Marital status: Single    Spouse name: Not on file  . Number of children: 2  . Years of education: Not on file  . Highest education level: Not on file  Social Needs  . Financial resource strain: Not on file  . Food insecurity - worry: Not on file  . Food insecurity - inability: Not on file  . Transportation needs - medical: Not on file  . Transportation needs - non-medical: Not on file  Occupational History  . Occupation: N/A    Employer: NOT EMPLOYED  Tobacco Use  . Smoking status: Former Smoker    Packs/day: 1.50    Years: 3.00    Pack years: 4.50    Types: Cigarettes    Last attempt to quit: 10/31/2013    Years since quitting: 3.4  . Smokeless tobacco: Never Used  Substance and Sexual Activity  . Alcohol use: No    Alcohol/week: 0.6 oz    Types: 1 Cans of beer per week  . Drug use: No    Comment: 10/31/2013 "might use cocaine once/month" - Has not used since 2015.   Marland Kitchen Sexual activity: No  Other Topics Concern  . Not on file  Social History Narrative  . Not on file    Physical Exam  Constitutional: He is oriented to person, place, and time.  Cardiovascular: Normal rate and regular  rhythm.  Pulmonary/Chest: Effort normal and breath sounds normal. No respiratory distress. He has no wheezes. He has no rales.  Musculoskeletal: Normal range of motion. He exhibits no edema.  Neurological: He is alert and oriented to person, place, and time.  Skin: Skin is warm and dry.  Psychiatric: He has a normal mood and affect.        Future Appointments  Date Time Provider [place]  06/06/2017  3:00 PM [facility] PA/NP [facility] None    BP 110/82 (BP Location: Right Arm, Patient Position: Sitting, Cuff Size: Large)   Pulse 70   Resp 18   Wt 270 lb (122.5 kg)   SpO2 98%   BMI 46.35 kg/m   Weight yesterday- 270 lb Last visit weight- 271 lb  Mr Nephew was seen at home today and reported feeling well. He stated he has been compliant  with his medications and exercising regularly. He denied SOB, headache, dizziness or orthopnea. He did not have any concerns during this visit but said he had missed a call from his PCP and had been trying to get back in touch with them. We called today and were not able to get in touch with anyone. He advised he will call again tomorrow. His medications were verified and his pillbox was refilled.   Time spent with patient: 33 minutes  Douglas [name], EMT 03/28/17  ACTION: Home visit completed Next visit planned for 1 week

## 2017-03-29 ENCOUNTER — Encounter (HOSPITAL_COMMUNITY): Payer: Self-pay | Admitting: *Deleted

## 2017-03-29 NOTE — Progress Notes (Signed)
Received medical records request from [facility] on 03/29/2017.  CASE # [id number]  Requested records faxed today to [phone].  Original request will be scanned to patient's electronic medical record.

## 2017-04-05 ENCOUNTER — Other Ambulatory Visit (HOSPITAL_COMMUNITY): Payer: Self-pay

## 2017-04-05 NOTE — Progress Notes (Addendum)
Paramedicine Encounter    Patient ID: Douglas Madura., male    DOB: 03/07/1985, 32 y.o.   MRN: [id number]   Patient Care Team: Medicine, [facility] as PCP - General  Patient Active Problem List   Diagnosis Date Noted  . Morbid obesity ([place]) 06/17/2015  . Essential hypertension 04/14/2015  . Hyperkalemia 04/14/2015  . OSA (obstructive sleep apnea)   . Difficult intravenous access   . Chronic diastolic CHF (congestive heart failure), NYHA class 3 ([place]) 09/13/2014  . Cor pulmonale ([place]) 03/19/2014  . OSA treated with BiPAP     Current Outpatient Medications:  .  carvedilol (COREG) 6.25 MG tablet, Take 1.5 tablets (9.375 mg total) by mouth 2 (two) times daily with a meal., Disp: 90 tablet, Rfl: 11 .  hydrALAZINE (APRESOLINE) 25 MG tablet, Take 0.5 tablets (12.5 mg total) by mouth 3 (three) times daily., Disp: 135 tablet, Rfl: 6 .  losartan (COZAAR) 50 MG tablet, TAKE ONE (1) TABLET BY MOUTH TWO (2) TIMES DAILY, Disp: 60 tablet, Rfl: 3 .  NON FORMULARY, 2L of continuous oxygen, Disp: , Rfl:  .  PROVENTIL HFA 108 (90 Base) MCG/ACT inhaler, INHALE 1 TO 2 PUFFS INTO THE LUNGS EVERY6 HOURS AS NEEDED FOR WHEEZING OR SHORTNESS OF BREATH, Disp: 6.7 g, Rfl: 5 .  torsemide (DEMADEX) 20 MG tablet, Take 3 tablets (60 mg total) by mouth 2 (two) times daily., Disp: 180 tablet, Rfl: 3 .  colchicine 0.6 MG tablet, Take two tablets as one dose followed one hour later by one tablet. (Patient not taking: Reported on 03/14/2017), Disp: 3 tablet, Rfl: 0 .  ibuprofen (ADVIL,MOTRIN) 600 MG tablet, Take 1 tablet (600 mg total) by mouth every 6 (six) hours as needed. (Patient not taking: Reported on 03/28/2017), Disp: 21 tablet, Rfl: 0 .  sildenafil (VIAGRA) 50 MG tablet, Take 1 tablet (50 mg total) by mouth daily as needed for erectile dysfunction. (Patient not taking: Reported on 03/14/2017), Disp: 10 tablet, Rfl: 0 Allergies  Allergen Reactions  . Lisinopril Cough      Social History    Socioeconomic History  . Marital status: Single    Spouse name: Not on file  . Number of children: 2  . Years of education: Not on file  . Highest education level: Not on file  Social Needs  . Financial resource strain: Not on file  . Food insecurity - worry: Not on file  . Food insecurity - inability: Not on file  . Transportation needs - medical: Not on file  . Transportation needs - non-medical: Not on file  Occupational History  . Occupation: N/A    Employer: NOT EMPLOYED  Tobacco Use  . Smoking status: Former Smoker    Packs/day: 1.50    Years: 3.00    Pack years: 4.50    Types: Cigarettes    Last attempt to quit: 10/31/2013    Years since quitting: 3.4  . Smokeless tobacco: Never Used  Substance and Sexual Activity  . Alcohol use: No    Alcohol/week: 0.6 oz    Types: 1 Cans of beer per week  . Drug use: No    Comment: 10/31/2013 "might use cocaine once/month" - Has not used since 2015.   Marland Kitchen Sexual activity: No  Other Topics Concern  . Not on file  Social History Narrative  . Not on file    Physical Exam  Constitutional: He is oriented to person, place, and time.  Cardiovascular: Normal rate and regular  rhythm.  Pulmonary/Chest: Effort normal and breath sounds normal. No respiratory distress. He has no wheezes. He has no rales.  Abdominal: Soft. He exhibits no distension.  Musculoskeletal: Normal range of motion. He exhibits no edema.  Neurological: He is alert and oriented to person, place, and time.  Skin: Skin is warm and dry.  Psychiatric: He has a normal mood and affect.        Future Appointments  Date Time Provider [place]  06/06/2017  3:00 PM [facility] PA/NP [facility] None    BP 108/78 (BP Location: Left Arm, Patient Position: Sitting, Cuff Size: Large)   Pulse 70   Resp 16   Wt 274 lb (124.3 kg)   SpO2 96%   BMI 47.03 kg/m   Weight yesterday- 275 lb Last visit weight- 270 lb  Douglas [name] was seen at home today and reported  feeling well. He denied SOB, headache, dizziness or orthopnea. He has been compliant with his medications and trying to eat healthier. He has concerns with how long he will need to wear oxygen or if this will be a lifelong therapy. I will inquire about that with clinic staff tomorrow. He advised his PCP called and advised he has "too much blood and donating blood would help solve the issue." I advised that he had a history of elevated hemoglobin and RBS's and he should not be concerned with it at this time. I will relay this information to the clinic at our next team meeting.   Time spent with patient: 35 minutes  [name], EMT 04/05/17  ACTION: Home visit completed Next visit planned for 1 week

## 2017-04-12 ENCOUNTER — Other Ambulatory Visit (HOSPITAL_COMMUNITY): Payer: Self-pay

## 2017-04-12 NOTE — Progress Notes (Signed)
Paramedicine Encounter    Patient ID: Douglas [name]., male    DOB: May 22, 1985, 32 y.o.   MRN: [id number]   Patient Care Team: Medicine, [facility] as PCP - General  Patient Active Problem List   Diagnosis Date Noted  . Morbid obesity ([place]) 06/17/2015  . Essential hypertension 04/14/2015  . Hyperkalemia 04/14/2015  . OSA (obstructive sleep apnea)   . Difficult intravenous access   . Chronic diastolic CHF (congestive heart failure), NYHA class 3 ([place]) 09/13/2014  . Cor pulmonale ([place]) 03/19/2014  . OSA treated with BiPAP     Current Outpatient Medications:  .  carvedilol (COREG) 6.25 MG tablet, Take 1.5 tablets (9.375 mg total) by mouth 2 (two) times daily with a meal., Disp: 90 tablet, Rfl: 11 .  hydrALAZINE (APRESOLINE) 25 MG tablet, Take 0.5 tablets (12.5 mg total) by mouth 3 (three) times daily., Disp: 135 tablet, Rfl: 6 .  losartan (COZAAR) 50 MG tablet, TAKE ONE (1) TABLET BY MOUTH TWO (2) TIMES DAILY, Disp: 60 tablet, Rfl: 3 .  NON FORMULARY, 2L of continuous oxygen, Disp: , Rfl:  .  PROVENTIL HFA 108 (90 Base) MCG/ACT inhaler, INHALE 1 TO 2 PUFFS INTO THE LUNGS EVERY6 HOURS AS NEEDED FOR WHEEZING OR SHORTNESS OF BREATH, Disp: 6.7 g, Rfl: 5 .  torsemide (DEMADEX) 20 MG tablet, Take 3 tablets (60 mg total) by mouth 2 (two) times daily., Disp: 180 tablet, Rfl: 3 .  colchicine 0.6 MG tablet, Take two tablets as one dose followed one hour later by one tablet. (Patient not taking: Reported on 03/14/2017), Disp: 3 tablet, Rfl: 0 .  ibuprofen (ADVIL,MOTRIN) 600 MG tablet, Take 1 tablet (600 mg total) by mouth every 6 (six) hours as needed. (Patient not taking: Reported on 03/28/2017), Disp: 21 tablet, Rfl: 0 .  sildenafil (VIAGRA) 50 MG tablet, Take 1 tablet (50 mg total) by mouth daily as needed for erectile dysfunction. (Patient not taking: Reported on 03/14/2017), Disp: 10 tablet, Rfl: 0 Allergies  Allergen Reactions  . Lisinopril Cough      Social History    Socioeconomic History  . Marital status: Single    Spouse name: Not on file  . Number of children: 2  . Years of education: Not on file  . Highest education level: Not on file  Social Needs  . Financial resource strain: Not on file  . Food insecurity - worry: Not on file  . Food insecurity - inability: Not on file  . Transportation needs - medical: Not on file  . Transportation needs - non-medical: Not on file  Occupational History  . Occupation: N/A    Employer: NOT EMPLOYED  Tobacco Use  . Smoking status: Former Smoker    Packs/day: 1.50    Years: 3.00    Pack years: 4.50    Types: Cigarettes    Last attempt to quit: 10/31/2013    Years since quitting: 3.4  . Smokeless tobacco: Never Used  Substance and Sexual Activity  . Alcohol use: No    Alcohol/week: 0.6 oz    Types: 1 Cans of beer per week  . Drug use: No    Comment: 10/31/2013 "might use cocaine once/month" - Has not used since 2015.   Marland Kitchen Sexual activity: No  Other Topics Concern  . Not on file  Social History Narrative  . Not on file    Physical Exam  Constitutional: He is oriented to person, place, and time.  Cardiovascular: Normal rate and regular  rhythm.  Pulmonary/Chest: Effort normal and breath sounds normal. No respiratory distress. He has no wheezes. He has no rales.  Abdominal: Soft.  Musculoskeletal: Normal range of motion. He exhibits no edema.  Neurological: He is alert and oriented to person, place, and time.  Skin: Skin is warm and dry.  Psychiatric: He has a normal mood and affect.        Future Appointments  Date Time Provider [place]  06/06/2017  3:00 PM [facility] PA/NP [facility] None    BP 110/68 (BP Location: Left Arm, Patient Position: Sitting, Cuff Size: Large)   Pulse 79   Resp 16   Wt 272 lb (123.4 kg)   SpO2 96%   BMI 46.69 kg/m   Weight yesterday- 272 lb Last visit weight- 274 lb  Douglas [name] was seen at home today and reported feeling well. He denied SOB,  headache, dizziness, or orthopnea. He stated he has been following his diet and fluid restrictions and is compliant with his medications. I verified his medications and refilled his pillbox. 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 He is going to work on Monday and Tuesday of next week and see how he feels to determine if it will be a god fit.   Time spent with patient: 28 minutes  [name], EMT 04/12/17  ACTION: Home visit completed Next visit planned for 1 week

## 2017-04-15 ENCOUNTER — Other Ambulatory Visit: Payer: Self-pay | Admitting: Pulmonary Disease

## 2017-04-15 ENCOUNTER — Other Ambulatory Visit (HOSPITAL_COMMUNITY): Payer: Self-pay | Admitting: Cardiology

## 2017-04-19 ENCOUNTER — Other Ambulatory Visit (HOSPITAL_COMMUNITY): Payer: Self-pay

## 2017-04-19 NOTE — Progress Notes (Signed)
Paramedicine Encounter    Patient ID: Douglas [name]., male    DOB: May 29, 1985, 32 y.o.   MRN: [id number]   Patient Care Team: Medicine, [facility] as PCP - General  Patient Active Problem List   Diagnosis Date Noted  . Morbid obesity ([place]) 06/17/2015  . Essential hypertension 04/14/2015  . Hyperkalemia 04/14/2015  . OSA (obstructive sleep apnea)   . Difficult intravenous access   . Chronic diastolic CHF (congestive heart failure), NYHA class 3 ([place]) 09/13/2014  . Cor pulmonale ([place]) 03/19/2014  . OSA treated with BiPAP     Current Outpatient Medications:  .  carvedilol (COREG) 6.25 MG tablet, Take 1.5 tablets (9.375 mg total) by mouth 2 (two) times daily with a meal., Disp: 90 tablet, Rfl: 11 .  colchicine 0.6 MG tablet, Take two tablets as one dose followed one hour later by one tablet. (Patient not taking: Reported on 03/14/2017), Disp: 3 tablet, Rfl: 0 .  hydrALAZINE (APRESOLINE) 25 MG tablet, Take 0.5 tablets (12.5 mg total) by mouth 3 (three) times daily., Disp: 135 tablet, Rfl: 6 .  ibuprofen (ADVIL,MOTRIN) 600 MG tablet, Take 1 tablet (600 mg total) by mouth every 6 (six) hours as needed. (Patient not taking: Reported on 03/28/2017), Disp: 21 tablet, Rfl: 0 .  losartan (COZAAR) 50 MG tablet, TAKE ONE (1) TABLET BY MOUTH TWO (2) TIMES DAILY, Disp: 60 tablet, Rfl: 3 .  NON FORMULARY, 2L of continuous oxygen, Disp: , Rfl:  .  PROVENTIL HFA 108 (90 Base) MCG/ACT inhaler, INHALE 1 TO 2 PUFFS BY MOUTH EVERY 6 (SIX) HOURS AS NEEDED FOR WHEEZING OR SHORTNESS OF BREATH, Disp: 6.7 g, Rfl: 5 .  sildenafil (VIAGRA) 50 MG tablet, Take 1 tablet (50 mg total) by mouth daily as needed for erectile dysfunction. (Patient not taking: Reported on 03/14/2017), Disp: 10 tablet, Rfl: 0 .  torsemide (DEMADEX) 20 MG tablet, Take 3 tablets (60 mg total) by mouth 2 (two) times daily., Disp: 180 tablet, Rfl: 3 Allergies  Allergen Reactions  . Lisinopril Cough      Social History    Socioeconomic History  . Marital status: Single    Spouse name: Not on file  . Number of children: 2  . Years of education: Not on file  . Highest education level: Not on file  Occupational History  . Occupation: N/A    Employer: NOT EMPLOYED  Social Needs  . Financial resource strain: Not on file  . Food insecurity:    Worry: Not on file    Inability: Not on file  . Transportation needs:    Medical: Not on file    Non-medical: Not on file  Tobacco Use  . Smoking status: Former Smoker    Packs/day: 1.50    Years: 3.00    Pack years: 4.50    Types: Cigarettes    Last attempt to quit: 10/31/2013    Years since quitting: 3.4  . Smokeless tobacco: Never Used  Substance and Sexual Activity  . Alcohol use: No    Alcohol/week: 0.6 oz    Types: 1 Cans of beer per week  . Drug use: No    Types: Cocaine    Comment: 10/31/2013 "might use cocaine once/month" - Has not used since 2015.   Marland Kitchen Sexual activity: Never  Lifestyle  . Physical activity:    Days per week: Not on file    Minutes per session: Not on file  . Stress: Not on file  Relationships  .  Social connections:    Talks on phone: Not on file    Gets together: Not on file    Attends religious service: Not on file    Active member of club or organization: Not on file    Attends meetings of clubs or organizations: Not on file    Relationship status: Not on file  . Intimate partner violence:    Fear of current or ex partner: Not on file    Emotionally abused: Not on file    Physically abused: Not on file    Forced sexual activity: Not on file  Other Topics Concern  . Not on file  Social History Narrative  . Not on file    Physical Exam  Constitutional: He is oriented to person, place, and time.  Cardiovascular: Normal rate and regular rhythm.  Pulmonary/Chest: Effort normal and breath sounds normal. No respiratory distress. He has no wheezes. He has no rales.  Abdominal: Soft.  Musculoskeletal: Normal range of  motion. He exhibits no edema.  Neurological: He is alert and oriented to person, place, and time.  Skin: Skin is warm and dry.  Psychiatric: He has a normal mood and affect.        Future Appointments  Date Time Provider [place]  06/06/2017  3:00 PM [facility] PA/NP [facility] None    BP 110/70 (BP Location: Left Arm, Patient Position: Sitting, Cuff Size: Large)   Pulse 71   Resp 16   Wt 271 lb (122.9 kg)   SpO2 95%   BMI 46.52 kg/m   Weight yesterday- 274 lb Last visit weight- 272 lb  [name] was seen at home today and reported feeling generally well. He denied SOB, headache, dizziness or orthopnea. He had been compliant with his medications and has been trying to follow a low sodium diet while limiting his fluid to 2 liter or less per day. Torsemide was ordered from [employer] and will be delivered tomorrow. He has it in his pillbox through the weekend and knows where to put it when it arrives. No progress with regards to his atypical blood work from his PCP but will follow up with the HF clinic at the end of the week to see if the labs arrived.   Time spent with patient: 29 minutes  Douglas [name], EMT 04/19/17  ACTION: Home visit completed Next visit planned for 1 week

## 2017-04-20 ENCOUNTER — Telehealth (HOSPITAL_COMMUNITY): Payer: Self-pay | Admitting: Surgery

## 2017-04-20 NOTE — Telephone Encounter (Signed)
I received a call from [facility] that patient had been instructed by his PCP to go "give blood- because he had too much".  Thedore Mins says that he called PCP and they did in fact inform him that they advised patient to donate blood due the fact that his blood counts were "high".  I told Thedore Mins that that I would run this information by our HF Clinic providers and inform him if recommendation from them was anything different.

## 2017-04-21 NOTE — Telephone Encounter (Signed)
That should be OK.  May need to adjust torsemide on days he gives blood.  Would hold am dose the first time he goes.    Legrand Como [address]" [place], [place] 04/21/2017 12:37 PM

## 2017-04-24 ENCOUNTER — Other Ambulatory Visit (HOSPITAL_COMMUNITY): Payer: Self-pay | Admitting: Cardiology

## 2017-04-25 ENCOUNTER — Other Ambulatory Visit (HOSPITAL_COMMUNITY): Payer: Self-pay

## 2017-04-25 NOTE — Progress Notes (Signed)
Paramedicine Encounter    Patient ID: Douglas [name]., male    DOB: Jun 25, 1985, 32 y.o.   MRN: [id number]   Patient Care Team: Medicine, [facility] as PCP - General  Patient Active Problem List   Diagnosis Date Noted  . Morbid obesity ([place]) 06/17/2015  . Essential hypertension 04/14/2015  . Hyperkalemia 04/14/2015  . OSA (obstructive sleep apnea)   . Difficult intravenous access   . Chronic diastolic CHF (congestive heart failure), NYHA class 3 ([place]) 09/13/2014  . Cor pulmonale ([place]) 03/19/2014  . OSA treated with BiPAP     Current Outpatient Medications:  .  carvedilol (COREG) 6.25 MG tablet, Take 1.5 tablets (9.375 mg total) by mouth 2 (two) times daily with a meal., Disp: 90 tablet, Rfl: 11 .  hydrALAZINE (APRESOLINE) 25 MG tablet, Take 0.5 tablets (12.5 mg total) by mouth 3 (three) times daily., Disp: 135 tablet, Rfl: 6 .  losartan (COZAAR) 50 MG tablet, TAKE ONE (1) TABLET BY MOUTH TWO (2) TIMES DAILY, Disp: 60 tablet, Rfl: 3 .  NON FORMULARY, 2L of continuous oxygen, Disp: , Rfl:  .  PROVENTIL HFA 108 (90 Base) MCG/ACT inhaler, INHALE 1 TO 2 PUFFS BY MOUTH EVERY 6 (SIX) HOURS AS NEEDED FOR WHEEZING OR SHORTNESS OF BREATH, Disp: 6.7 g, Rfl: 5 .  torsemide (DEMADEX) 20 MG tablet, Take 3 tablets (60 mg total) by mouth 2 (two) times daily., Disp: 180 tablet, Rfl: 3 .  colchicine 0.6 MG tablet, Take two tablets as one dose followed one hour later by one tablet. (Patient not taking: Reported on 03/14/2017), Disp: 3 tablet, Rfl: 0 .  ibuprofen (ADVIL,MOTRIN) 600 MG tablet, Take 1 tablet (600 mg total) by mouth every 6 (six) hours as needed. (Patient not taking: Reported on 03/28/2017), Disp: 21 tablet, Rfl: 0 .  sildenafil (VIAGRA) 50 MG tablet, Take 1 tablet (50 mg total) by mouth daily as needed for erectile dysfunction. (Patient not taking: Reported on 03/14/2017), Disp: 10 tablet, Rfl: 0 Allergies  Allergen Reactions  . Lisinopril Cough      Social History    Socioeconomic History  . Marital status: Single    Spouse name: Not on file  . Number of children: 2  . Years of education: Not on file  . Highest education level: Not on file  Occupational History  . Occupation: N/A    Employer: NOT EMPLOYED  Social Needs  . Financial resource strain: Not on file  . Food insecurity:    Worry: Not on file    Inability: Not on file  . Transportation needs:    Medical: Not on file    Non-medical: Not on file  Tobacco Use  . Smoking status: Former Smoker    Packs/day: 1.50    Years: 3.00    Pack years: 4.50    Types: Cigarettes    Last attempt to quit: 10/31/2013    Years since quitting: 3.4  . Smokeless tobacco: Never Used  Substance and Sexual Activity  . Alcohol use: No    Alcohol/week: 0.6 oz    Types: 1 Cans of beer per week  . Drug use: No    Types: Cocaine    Comment: 10/31/2013 "might use cocaine once/month" - Has not used since 2015.   Marland Kitchen Sexual activity: Never  Lifestyle  . Physical activity:    Days per week: Not on file    Minutes per session: Not on file  . Stress: Not on file  Relationships  .  Social connections:    Talks on phone: Not on file    Gets together: Not on file    Attends religious service: Not on file    Active member of club or organization: Not on file    Attends meetings of clubs or organizations: Not on file    Relationship status: Not on file  . Intimate partner violence:    Fear of current or ex partner: Not on file    Emotionally abused: Not on file    Physically abused: Not on file    Forced sexual activity: Not on file  Other Topics Concern  . Not on file  Social History Narrative  . Not on file    Physical Exam  Constitutional: He is oriented to person, place, and time.  Cardiovascular: Normal rate and regular rhythm.  Pulmonary/Chest: Effort normal. No respiratory distress. He has no wheezes. He has no rales.  Abdominal: Soft. He exhibits no distension.  Musculoskeletal: Normal range  of motion. He exhibits no edema.  Neurological: He is alert and oriented to person, place, and time.  Skin: Skin is warm and dry.  Psychiatric: He has a normal mood and affect.        Future Appointments  Date Time Provider [place]  06/06/2017  3:00 PM [facility] PA/NP [facility] None    BP 110/80 (BP Location: Left Arm, Patient Position: Sitting, Cuff Size: Large)   Pulse 68   Resp 16   Wt 274 lb 14.4 oz (124.7 kg)   SpO2 95%   BMI 47.19 kg/m   Weight yesterday- 274 lb Last visit weight- 271 lb  Mr Savastano was seen at home today and reported feeling well. He stated he has been compliant with his medications and even filled his own pillbox without difficulty. He needed to have his carvedilol refilled however when he called in the refill he was advised that his Medicaid is not valid. We called [facility] and left a message for them to call him and explain to him the problem so that he can fix it. Medications were verified and no further changes were necessary.  Time spent with patient: 32 minutes  [name], EMT 04/25/17  ACTION: Home visit completed Next visit planned for 1 week

## 2017-05-02 ENCOUNTER — Telehealth (HOSPITAL_COMMUNITY): Payer: Self-pay | Admitting: *Deleted

## 2017-05-02 ENCOUNTER — Other Ambulatory Visit (HOSPITAL_COMMUNITY): Payer: Self-pay

## 2017-05-02 NOTE — Progress Notes (Signed)
Paramedicine Encounter    Patient ID: [name], male    DOB: September 20, 1985, 32 y.o.   MRN: [id number]   Patient Care Team: Medicine, [facility] as PCP - General  Patient Active Problem List   Diagnosis Date Noted  . Morbid obesity ([place]) 06/17/2015  . Essential hypertension 04/14/2015  . Hyperkalemia 04/14/2015  . OSA (obstructive sleep apnea)   . Difficult intravenous access   . Chronic diastolic CHF (congestive heart failure), NYHA class 3 ([place]) 09/13/2014  . Cor pulmonale ([place]) 03/19/2014  . OSA treated with BiPAP     Current Outpatient Medications:  .  carvedilol (COREG) 6.25 MG tablet, Take 1.5 tablets (9.375 mg total) by mouth 2 (two) times daily with a meal., Disp: 90 tablet, Rfl: 11 .  hydrALAZINE (APRESOLINE) 25 MG tablet, Take 0.5 tablets (12.5 mg total) by mouth 3 (three) times daily., Disp: 135 tablet, Rfl: 6 .  losartan (COZAAR) 50 MG tablet, TAKE ONE (1) TABLET BY MOUTH TWO (2) TIMES DAILY, Disp: 60 tablet, Rfl: 3 .  NON FORMULARY, 2L of continuous oxygen, Disp: , Rfl:  .  PROVENTIL HFA 108 (90 Base) MCG/ACT inhaler, INHALE 1 TO 2 PUFFS BY MOUTH EVERY 6 (SIX) HOURS AS NEEDED FOR WHEEZING OR SHORTNESS OF BREATH, Disp: 6.7 g, Rfl: 5 .  torsemide (DEMADEX) 20 MG tablet, Take 3 tablets (60 mg total) by mouth 2 (two) times daily., Disp: 180 tablet, Rfl: 3 .  colchicine 0.6 MG tablet, Take two tablets as one dose followed one hour later by one tablet. (Patient not taking: Reported on 03/14/2017), Disp: 3 tablet, Rfl: 0 .  ibuprofen (ADVIL,MOTRIN) 600 MG tablet, Take 1 tablet (600 mg total) by mouth every 6 (six) hours as needed. (Patient not taking: Reported on 03/28/2017), Disp: 21 tablet, Rfl: 0 .  sildenafil (VIAGRA) 50 MG tablet, Take 1 tablet (50 mg total) by mouth daily as needed for erectile dysfunction. (Patient not taking: Reported on 03/14/2017), Disp: 10 tablet, Rfl: 0 Allergies  Allergen Reactions  . Lisinopril Cough      Social History    Socioeconomic History  . Marital status: Single    Spouse name: Not on file  . Number of children: 2  . Years of education: Not on file  . Highest education level: Not on file  Occupational History  . Occupation: N/A    Employer: NOT EMPLOYED  Social Needs  . Financial resource strain: Not on file  . Food insecurity:    Worry: Not on file    Inability: Not on file  . Transportation needs:    Medical: Not on file    Non-medical: Not on file  Tobacco Use  . Smoking status: Former Smoker    Packs/day: 1.50    Years: 3.00    Pack years: 4.50    Types: Cigarettes    Last attempt to quit: 10/31/2013    Years since quitting: 3.5  . Smokeless tobacco: Never Used  Substance and Sexual Activity  . Alcohol use: No    Alcohol/week: 0.6 oz    Types: 1 Cans of beer per week  . Drug use: No    Types: Cocaine    Comment: 10/31/2013 "might use cocaine once/month" - Has not used since 2015.   Marland Kitchen Sexual activity: Never  Lifestyle  . Physical activity:    Days per week: Not on file    Minutes per session: Not on file  . Stress: Not on file  Relationships  .  Social connections:    Talks on phone: Not on file    Gets together: Not on file    Attends religious service: Not on file    Active member of club or organization: Not on file    Attends meetings of clubs or organizations: Not on file    Relationship status: Not on file  . Intimate partner violence:    Fear of current or ex partner: Not on file    Emotionally abused: Not on file    Physically abused: Not on file    Forced sexual activity: Not on file  Other Topics Concern  . Not on file  Social History Narrative  . Not on file    Physical Exam  Constitutional: He is oriented to person, place, and time.  Cardiovascular: Normal rate and regular rhythm.  Pulmonary/Chest: Effort normal and breath sounds normal. No respiratory distress. He has no wheezes. He has no rales.  Abdominal: Soft. He exhibits no distension.   Musculoskeletal: Normal range of motion. He exhibits no edema.  Neurological: He is alert and oriented to person, place, and time.  Skin: Skin is warm and dry.  Psychiatric: He has a normal mood and affect.        Future Appointments  Date Time Provider [place]  06/06/2017  3:00 PM [facility] PA/NP [facility] None    BP 132/82 (BP Location: Left Arm, Patient Position: Sitting, Cuff Size: Large)   Pulse 80   Resp 16   Wt 279 lb (126.6 kg)   SpO2 96%   BMI 47.89 kg/m   Weight yesterday- 277 lb Last visit weight- 274 lb  Mr Mcdanel was seen at home today and reported feeling generally well. He denied SOB, headache, dizziness or orthopnea. He reports a weight gain of 5 pounds in the past week. He attributed this to drinking more while he has been working and says it has been mostly juice. I explained that he can increase his fluid intake slightly when he is working since he may be sweating but it is best practice to replace the lost fluid with water since juice may have some sodium and result in weight gain. I discussed this with the Susie at the clinic who advised that since he is confident attributing the weight gain to a dietary issue that no medication changes were necessary. He stated he has been compliant with his medications but ran out of carvedilol yesterday. It has been ordered and will be delivered by the end of business today. He also advised that he has been in contact with his case worker who advised his Medicaid issue has been resolved.   Time spent with patient: 36 minutes  [name], EMT 05/02/17  ACTION: Home visit completed Next visit planned for 1 week

## 2017-05-02 NOTE — Telephone Encounter (Signed)
[name] with paramedicine called to report patient had a 5 lb weight gain since last week.  Patient stated this was all diet related and doesn't feel he needs to change his medications.  He said he will start limited his fluids and if it doesn't come down he will call us back.  Patient is asymptomatic.

## 2017-05-09 ENCOUNTER — Other Ambulatory Visit (HOSPITAL_COMMUNITY): Payer: Self-pay

## 2017-05-09 NOTE — Progress Notes (Signed)
Paramedicine Encounter    Patient ID: [name], male    DOB: September 20, 1985, 32 y.o.   MRN: [id number]   Patient Care Team: Medicine, [facility] as PCP - General  Patient Active Problem List   Diagnosis Date Noted  . Morbid obesity ([place]) 06/17/2015  . Essential hypertension 04/14/2015  . Hyperkalemia 04/14/2015  . OSA (obstructive sleep apnea)   . Difficult intravenous access   . Chronic diastolic CHF (congestive heart failure), NYHA class 3 ([place]) 09/13/2014  . Cor pulmonale ([place]) 03/19/2014  . OSA treated with BiPAP     Current Outpatient Medications:  .  carvedilol (COREG) 6.25 MG tablet, Take 1.5 tablets (9.375 mg total) by mouth 2 (two) times daily with a meal., Disp: 90 tablet, Rfl: 11 .  hydrALAZINE (APRESOLINE) 25 MG tablet, Take 0.5 tablets (12.5 mg total) by mouth 3 (three) times daily., Disp: 135 tablet, Rfl: 6 .  losartan (COZAAR) 50 MG tablet, TAKE ONE (1) TABLET BY MOUTH TWO (2) TIMES DAILY, Disp: 60 tablet, Rfl: 3 .  NON FORMULARY, 2L of continuous oxygen, Disp: , Rfl:  .  PROVENTIL HFA 108 (90 Base) MCG/ACT inhaler, INHALE 1 TO 2 PUFFS BY MOUTH EVERY 6 (SIX) HOURS AS NEEDED FOR WHEEZING OR SHORTNESS OF BREATH, Disp: 6.7 g, Rfl: 5 .  torsemide (DEMADEX) 20 MG tablet, Take 3 tablets (60 mg total) by mouth 2 (two) times daily., Disp: 180 tablet, Rfl: 3 .  colchicine 0.6 MG tablet, Take two tablets as one dose followed one hour later by one tablet. (Patient not taking: Reported on 03/14/2017), Disp: 3 tablet, Rfl: 0 .  ibuprofen (ADVIL,MOTRIN) 600 MG tablet, Take 1 tablet (600 mg total) by mouth every 6 (six) hours as needed. (Patient not taking: Reported on 03/28/2017), Disp: 21 tablet, Rfl: 0 .  sildenafil (VIAGRA) 50 MG tablet, Take 1 tablet (50 mg total) by mouth daily as needed for erectile dysfunction. (Patient not taking: Reported on 03/14/2017), Disp: 10 tablet, Rfl: 0 Allergies  Allergen Reactions  . Lisinopril Cough      Social History    Socioeconomic History  . Marital status: Single    Spouse name: Not on file  . Number of children: 2  . Years of education: Not on file  . Highest education level: Not on file  Occupational History  . Occupation: N/A    Employer: NOT EMPLOYED  Social Needs  . Financial resource strain: Not on file  . Food insecurity:    Worry: Not on file    Inability: Not on file  . Transportation needs:    Medical: Not on file    Non-medical: Not on file  Tobacco Use  . Smoking status: Former Smoker    Packs/day: 1.50    Years: 3.00    Pack years: 4.50    Types: Cigarettes    Last attempt to quit: 10/31/2013    Years since quitting: 3.5  . Smokeless tobacco: Never Used  Substance and Sexual Activity  . Alcohol use: No    Alcohol/week: 0.6 oz    Types: 1 Cans of beer per week  . Drug use: No    Types: Cocaine    Comment: 10/31/2013 "might use cocaine once/month" - Has not used since 2015.   Marland Kitchen Sexual activity: Never  Lifestyle  . Physical activity:    Days per week: Not on file    Minutes per session: Not on file  . Stress: Not on file  Relationships  .  Social connections:    Talks on phone: Not on file    Gets together: Not on file    Attends religious service: Not on file    Active member of club or organization: Not on file    Attends meetings of clubs or organizations: Not on file    Relationship status: Not on file  . Intimate partner violence:    Fear of current or ex partner: Not on file    Emotionally abused: Not on file    Physically abused: Not on file    Forced sexual activity: Not on file  Other Topics Concern  . Not on file  Social History Narrative  . Not on file    Physical Exam  Constitutional: He is oriented to person, place, and time.  Cardiovascular: Normal rate and regular rhythm.  Pulmonary/Chest: Effort normal and breath sounds normal.  Abdominal: Soft. He exhibits no distension.  Musculoskeletal: Normal range of motion. He exhibits no edema.   Neurological: He is alert and oriented to person, place, and time.  Skin: Skin is warm and dry.  Psychiatric: He has a normal mood and affect.        Future Appointments  Date Time Provider [place]  06/06/2017  3:00 PM [facility] PA/NP [facility] None    BP (!) 140/96 (BP Location: Left Arm, Patient Position: Sitting, Cuff Size: Large)   Pulse 80   Resp 16   Wt 276 lb (125.2 kg)   SpO2 98%   BMI 47.38 kg/m   Weight yesterday- 277 lb Last visit weight- 279 lb  Mr Bartell was seen at home today and reported feeling well. He stated that over the weekend he met a person who had cancer and was in remission. He stated that speaking with her made him feel like he shouldn't be on so much medicine. He said it made him feel like he should be able to manage his diet and control his heart failure without the medicine. In fact he had stopped taking his medicine on Saturday evening. We talked for several minutes and I explained that his medications are there to help him and there is nothing wrong with needing medications to help him stay healthy. He reported that he understood and would continue taking his medication. His medications were verified and his pillbox was refilled.   Time spent with patient: 39 minutes  [name], EMT 05/09/17  ACTION: Home visit completed Next visit planned for 1 week

## 2017-05-16 ENCOUNTER — Other Ambulatory Visit (HOSPITAL_COMMUNITY): Payer: Self-pay

## 2017-05-16 NOTE — Progress Notes (Signed)
Paramedicine Encounter    Patient ID: [name], male    DOB: December 12, 1985, 32 y.o.   MRN: [id number]   Patient Care Team: Medicine, [facility] as PCP - General  Patient Active Problem List   Diagnosis Date Noted  . Morbid obesity ([place]) 06/17/2015  . Essential hypertension 04/14/2015  . Hyperkalemia 04/14/2015  . OSA (obstructive sleep apnea)   . Difficult intravenous access   . Chronic diastolic CHF (congestive heart failure), NYHA class 3 ([place]) 09/13/2014  . Cor pulmonale ([place]) 03/19/2014  . OSA treated with BiPAP     Current Outpatient Medications:  .  carvedilol (COREG) 6.25 MG tablet, Take 1.5 tablets (9.375 mg total) by mouth 2 (two) times daily with a meal., Disp: 90 tablet, Rfl: 11 .  losartan (COZAAR) 50 MG tablet, TAKE ONE (1) TABLET BY MOUTH TWO (2) TIMES DAILY, Disp: 60 tablet, Rfl: 3 .  NON FORMULARY, 2L of continuous oxygen, Disp: , Rfl:  .  PROVENTIL HFA 108 (90 Base) MCG/ACT inhaler, INHALE 1 TO 2 PUFFS BY MOUTH EVERY 6 (SIX) HOURS AS NEEDED FOR WHEEZING OR SHORTNESS OF BREATH, Disp: 6.7 g, Rfl: 5 .  torsemide (DEMADEX) 20 MG tablet, Take 3 tablets (60 mg total) by mouth 2 (two) times daily., Disp: 180 tablet, Rfl: 3 .  colchicine 0.6 MG tablet, Take two tablets as one dose followed one hour later by one tablet. (Patient not taking: Reported on 03/14/2017), Disp: 3 tablet, Rfl: 0 .  hydrALAZINE (APRESOLINE) 25 MG tablet, Take 0.5 tablets (12.5 mg total) by mouth 3 (three) times daily., Disp: 135 tablet, Rfl: 6 .  ibuprofen (ADVIL,MOTRIN) 600 MG tablet, Take 1 tablet (600 mg total) by mouth every 6 (six) hours as needed. (Patient not taking: Reported on 03/28/2017), Disp: 21 tablet, Rfl: 0 .  sildenafil (VIAGRA) 50 MG tablet, Take 1 tablet (50 mg total) by mouth daily as needed for erectile dysfunction. (Patient not taking: Reported on 03/14/2017), Disp: 10 tablet, Rfl: 0 Allergies  Allergen Reactions  . Lisinopril Cough      Social History    Socioeconomic History  . Marital status: Single    Spouse name: Not on file  . Number of children: 2  . Years of education: Not on file  . Highest education level: Not on file  Occupational History  . Occupation: N/A    Employer: NOT EMPLOYED  Social Needs  . Financial resource strain: Not on file  . Food insecurity:    Worry: Not on file    Inability: Not on file  . Transportation needs:    Medical: Not on file    Non-medical: Not on file  Tobacco Use  . Smoking status: Former Smoker    Packs/day: 1.50    Years: 3.00    Pack years: 4.50    Types: Cigarettes    Last attempt to quit: 10/31/2013    Years since quitting: 3.5  . Smokeless tobacco: Never Used  Substance and Sexual Activity  . Alcohol use: No    Alcohol/week: 0.6 oz    Types: 1 Cans of beer per week  . Drug use: No    Types: Cocaine    Comment: 10/31/2013 "might use cocaine once/month" - Has not used since 2015.   Marland Kitchen Sexual activity: Never  Lifestyle  . Physical activity:    Days per week: Not on file    Minutes per session: Not on file  . Stress: Not on file  Relationships  .  Social connections:    Talks on phone: Not on file    Gets together: Not on file    Attends religious service: Not on file    Active member of club or organization: Not on file    Attends meetings of clubs or organizations: Not on file    Relationship status: Not on file  . Intimate partner violence:    Fear of current or ex partner: Not on file    Emotionally abused: Not on file    Physically abused: Not on file    Forced sexual activity: Not on file  Other Topics Concern  . Not on file  Social History Narrative  . Not on file    Physical Exam      Future Appointments  Date Time Provider [place]  06/06/2017  3:00 PM [facility] PA/NP [facility] None    BP 108/68   Pulse 86   Resp 16   Wt 276 lb (125.2 kg)   SpO2 96%   BMI 47.38 kg/m   Weight yesterday-275 Last visit weight-276   Pt reports he is not  feeling well, he is having sinus issues, runny nose, coughing for approx 1 week. Advised him if he chose to take any meds OTC for it to ask pharmacist if its ok to take with his heart meds, I dont have any more copies of the OTC safe drugs with me.  Weight is stable from last visit with [name].  Pt reports his breathing is doing ok. +productive clear sputum cough. meds verified and pill box refilled. No missed doses of his meds. He is short of the hydralazine for the whole week-he reports getting it delivered and its "around here somewhere" and he reports able to place it in pill box once he finds it. Advised him to contact zack if he cant find them.  His initial 02 sat was 88% but after some deep breathing it increased to 98%, he has slight expiratory wheezing to rt upper and lower and slight exp wheeze to left upper. He has inhaler to use. He denies any sob. No dizziness, no pains. Advised to see PCP if cough/symptoms worsen.    Marylouise Stacks, [facility] Paramedic  05/16/17

## 2017-05-17 ENCOUNTER — Telehealth (HOSPITAL_COMMUNITY): Payer: Self-pay

## 2017-05-17 NOTE — Telephone Encounter (Signed)
I received a phone call from Douglas [name] this morning. He stated he had a cough and sinus congestion. I told him that he could use mucinex and allegra but if his symptoms persist he should contact his PCP. He was agreeable and said he would call.

## 2017-05-20 ENCOUNTER — Other Ambulatory Visit (HOSPITAL_COMMUNITY): Payer: Self-pay | Admitting: Cardiology

## 2017-05-24 ENCOUNTER — Telehealth (HOSPITAL_COMMUNITY): Payer: Self-pay | Admitting: *Deleted

## 2017-05-24 ENCOUNTER — Other Ambulatory Visit (HOSPITAL_COMMUNITY): Payer: Self-pay

## 2017-05-24 NOTE — Telephone Encounter (Signed)
Douglas [name] called to report patients urine has been dark, had a foul odor, and less urine output that normal. He is concerned about patients kidneys. Patient added to the lab schedule for bmet. Notified Legrand [facility]  is aware and agreeable with plan.

## 2017-05-24 NOTE — Progress Notes (Signed)
Paramedicine Encounter    Patient ID: Douglas Madura., male    DOB: 1985-05-11, 32 y.o.   MRN: [id number]   Patient Care Team: Medicine, [facility] as PCP - General  Patient Active Problem List   Diagnosis Date Noted  . Morbid obesity ([place]) 06/17/2015  . Essential hypertension 04/14/2015  . Hyperkalemia 04/14/2015  . OSA (obstructive sleep apnea)   . Difficult intravenous access   . Chronic diastolic CHF (congestive heart failure), NYHA class 3 ([place]) 09/13/2014  . Cor pulmonale ([place]) 03/19/2014  . OSA treated with BiPAP     Current Outpatient Medications:  .  carvedilol (COREG) 6.25 MG tablet, Take 1.5 tablets (9.375 mg total) by mouth 2 (two) times daily with a meal., Disp: 90 tablet, Rfl: 11 .  losartan (COZAAR) 50 MG tablet, TAKE ONE (1) TABLET BY MOUTH TWO (2) TIMES DAILY, Disp: 60 tablet, Rfl: 3 .  NON FORMULARY, 2L of continuous oxygen, Disp: , Rfl:  .  PROVENTIL HFA 108 (90 Base) MCG/ACT inhaler, INHALE 1 TO 2 PUFFS BY MOUTH EVERY 6 (SIX) HOURS AS NEEDED FOR WHEEZING OR SHORTNESS OF BREATH, Disp: 6.7 g, Rfl: 5 .  torsemide (DEMADEX) 20 MG tablet, TAKE 3 (THREE) TABLETS BY MOUTH TWICE DAILY, Disp: 180 tablet, Rfl: 3 .  colchicine 0.6 MG tablet, Take two tablets as one dose followed one hour later by one tablet. (Patient not taking: Reported on 03/14/2017), Disp: 3 tablet, Rfl: 0 .  hydrALAZINE (APRESOLINE) 25 MG tablet, Take 0.5 tablets (12.5 mg total) by mouth 3 (three) times daily. (Patient not taking: Reported on 05/24/2017), Disp: 135 tablet, Rfl: 6 .  ibuprofen (ADVIL,MOTRIN) 600 MG tablet, Take 1 tablet (600 mg total) by mouth every 6 (six) hours as needed. (Patient not taking: Reported on 03/28/2017), Disp: 21 tablet, Rfl: 0 .  sildenafil (VIAGRA) 50 MG tablet, Take 1 tablet (50 mg total) by mouth daily as needed for erectile dysfunction. (Patient not taking: Reported on 03/14/2017), Disp: 10 tablet, Rfl: 0 Allergies  Allergen Reactions  . Lisinopril Cough       Social History   Socioeconomic History  . Marital status: Single    Spouse name: Not on file  . Number of children: 2  . Years of education: Not on file  . Highest education level: Not on file  Occupational History  . Occupation: N/A    Employer: NOT EMPLOYED  Social Needs  . Financial resource strain: Not on file  . Food insecurity:    Worry: Not on file    Inability: Not on file  . Transportation needs:    Medical: Not on file    Non-medical: Not on file  Tobacco Use  . Smoking status: Former Smoker    Packs/day: 1.50    Years: 3.00    Pack years: 4.50    Types: Cigarettes    Last attempt to quit: 10/31/2013    Years since quitting: 3.5  . Smokeless tobacco: Never Used  Substance and Sexual Activity  . Alcohol use: No    Alcohol/week: 0.6 oz    Types: 1 Cans of beer per week  . Drug use: No    Types: Cocaine    Comment: 10/31/2013 "might use cocaine once/month" - Has not used since 2015.   Marland Kitchen Sexual activity: Never  Lifestyle  . Physical activity:    Days per week: Not on file    Minutes per session: Not on file  . Stress: Not on file  Relationships  . Social connections:    Talks on phone: Not on file    Gets together: Not on file    Attends religious service: Not on file    Active member of club or organization: Not on file    Attends meetings of clubs or organizations: Not on file    Relationship status: Not on file  . Intimate partner violence:    Fear of current or ex partner: Not on file    Emotionally abused: Not on file    Physically abused: Not on file    Forced sexual activity: Not on file  Other Topics Concern  . Not on file  Social History Narrative  . Not on file    Physical Exam  Constitutional: He is oriented to person, place, and time.  Cardiovascular: Normal rate and regular rhythm.  Pulmonary/Chest: Effort normal and breath sounds normal.  Abdominal: Soft. He exhibits no distension.  Musculoskeletal: Normal range of motion.  He exhibits no edema.  Neurological: He is alert and oriented to person, place, and time.  Skin: Skin is warm and dry.  Psychiatric: He has a normal mood and affect.        Future Appointments  Date Time Provider [place]  05/25/2017 11:30 AM [facility] LAB [facility] None  06/06/2017  3:00 PM [facility] PA/NP [facility] None    BP 110/78 (BP Location: Left Arm, Patient Position: Sitting, Cuff Size: Large)   Pulse 78   Resp 16   Wt 275 lb 6.4 oz (124.9 kg)   SpO2 94%   BMI 47.27 kg/m   Weight yesterday- 274 lb Last visit weight- 286 lb  Douglas [name] was seen at home today and reported feeling generally well. He reported having a productive cough which was preceded by sinus congestion related to seasonal allergies. He did not report any color associated with the sputum. He denied SOB, headaches, dizziness or orthopnea and has been compliant with his medications. He was able to fill his pillbox without assistance prior to my arrival however he is waiting for medications to be delivered from the pharmacy to complete this task. He has quit his part-time job because he felt like it was becoming taxing on his health. He reports being low on food so I will bring him some canned goods from the paramedicine office. He also reported that his urine has been a dark yellow to orange color and decreased output for the past month or so but he had not previous complained about this because he was not sure if it was anything to be concerned about. I explained that any time he is having changes in his urine to alert myself or the HF clinic. I called the clinic who requested he come in tomorrow for blood work and possible urinalysis. He said he would have his stepfather bring him in at 11:00. I will follow up when I bring him food tomorrow.   Time spent with patient: 34 minutes  [name], EMT 05/24/17  ACTION: Home visit completed Next visit planned for 1 week

## 2017-05-25 ENCOUNTER — Ambulatory Visit (HOSPITAL_COMMUNITY)
Admission: RE | Admit: 2017-05-25 | Discharge: 2017-05-25 | Disposition: A | Discharge status: Home / Self Care | Payer: Medicaid Other | Source: Ambulatory Visit | Attending: Internal Medicine | Admitting: Internal Medicine

## 2017-05-25 ENCOUNTER — Other Ambulatory Visit (HOSPITAL_COMMUNITY): Payer: Self-pay

## 2017-05-25 ENCOUNTER — Other Ambulatory Visit (HOSPITAL_COMMUNITY): Payer: Self-pay | Admitting: *Deleted

## 2017-05-25 DIAGNOSIS — I5032 Chronic diastolic (congestive) heart failure: Secondary | ICD-10-CM

## 2017-05-25 LAB — BASIC METABOLIC PANEL
Anion gap: 18 — ABNORMAL HIGH (ref 5–15)
BUN: 14 mg/dL (ref 6–20)
CHLORIDE: 88 mmol/L — AB (ref 101–111)
CO2: 32 mmol/L (ref 22–32)
Calcium: 8.9 mg/dL (ref 8.9–10.3)
Creatinine, Ser: 1.25 mg/dL — ABNORMAL HIGH (ref 0.61–1.24)
GFR calc non Af Amer: >60 mL/min (ref >60)
Glucose, Bld: 102 mg/dL — ABNORMAL HIGH (ref 65–99)
POTASSIUM: 4.9 mmol/L (ref 3.5–5.1)
Sodium: 138 mmol/L (ref 135–145)

## 2017-05-25 NOTE — Progress Notes (Signed)
I met [name] after his lab appointment today to deliver food from the [facility] pantry and a gift card to [employer] for groceries from the HF clinic. I also delivered his medications which I picked up from the pharmacy this morning. I helped him carry these items to the car he was riding due to the distance and weight of the bag of canned goods.  Time spent with patient: 10 minutes

## 2017-06-01 ENCOUNTER — Telehealth (HOSPITAL_COMMUNITY): Payer: Self-pay

## 2017-06-01 NOTE — Telephone Encounter (Signed)
I called Douglas [name] to schedule a meeting this week. Her stated that he was feeling well but did not want a visit this week because he just wanted to relax. I advised him to call me if anything changed otherwise I would contact him next week.

## 2017-06-06 ENCOUNTER — Encounter (HOSPITAL_COMMUNITY): Payer: Medicaid Other

## 2017-06-08 ENCOUNTER — Telehealth (HOSPITAL_COMMUNITY): Payer: Self-pay

## 2017-06-08 NOTE — Telephone Encounter (Signed)
I caalled Douglas [name] to schedule an appointment. He did not answer so I left a message requesting he call me back.

## 2017-06-09 ENCOUNTER — Telehealth (HOSPITAL_COMMUNITY): Payer: Self-pay

## 2017-06-09 NOTE — Telephone Encounter (Signed)
Douglas [name] returned my phone call today. He stated he was doing well and wanted to know if we could meet next week instead of today. We have agreed to meet on Monday at 10:00.

## 2017-06-12 ENCOUNTER — Other Ambulatory Visit (HOSPITAL_COMMUNITY): Payer: Self-pay

## 2017-06-12 ENCOUNTER — Telehealth (HOSPITAL_COMMUNITY): Payer: Self-pay | Admitting: *Deleted

## 2017-06-12 NOTE — Telephone Encounter (Signed)
[name] called to report pts 5lb weight gain in 3 days. Friday patient weight 273lbs today his weight is 278lbs. He is asymptomatic. BP is 138/106 but he has not taken any of his medications yet because he just woke up. Patient has a scheduled appt for 5/22. Per [name] no changes take medications as prescribed and keep appt on 5/22. PT aware and agreeable with plan.

## 2017-06-12 NOTE — Progress Notes (Signed)
Paramedicine Encounter    Patient ID: Douglas [name]., male    DOB: February 24, 1985, 32 y.o.   MRN: [id number]   Patient Care Team: Medicine, [facility] as PCP - General  Patient Active Problem List   Diagnosis Date Noted  . Morbid obesity ([place]) 06/17/2015  . Essential hypertension 04/14/2015  . Hyperkalemia 04/14/2015  . OSA (obstructive sleep apnea)   . Difficult intravenous access   . Chronic diastolic CHF (congestive heart failure), NYHA class 3 ([place]) 09/13/2014  . Cor pulmonale ([place]) 03/19/2014  . OSA treated with BiPAP     Current Outpatient Medications:  .  carvedilol (COREG) 6.25 MG tablet, Take 1.5 tablets (9.375 mg total) by mouth 2 (two) times daily with a meal., Disp: 90 tablet, Rfl: 11 .  hydrALAZINE (APRESOLINE) 25 MG tablet, Take 0.5 tablets (12.5 mg total) by mouth 3 (three) times daily., Disp: 135 tablet, Rfl: 6 .  losartan (COZAAR) 50 MG tablet, TAKE ONE (1) TABLET BY MOUTH TWO (2) TIMES DAILY, Disp: 60 tablet, Rfl: 3 .  NON FORMULARY, 2L of continuous oxygen, Disp: , Rfl:  .  PROVENTIL HFA 108 (90 Base) MCG/ACT inhaler, INHALE 1 TO 2 PUFFS BY MOUTH EVERY 6 (SIX) HOURS AS NEEDED FOR WHEEZING OR SHORTNESS OF BREATH, Disp: 6.7 g, Rfl: 5 .  torsemide (DEMADEX) 20 MG tablet, TAKE 3 (THREE) TABLETS BY MOUTH TWICE DAILY, Disp: 180 tablet, Rfl: 3 .  colchicine 0.6 MG tablet, Take two tablets as one dose followed one hour later by one tablet. (Patient not taking: Reported on 03/14/2017), Disp: 3 tablet, Rfl: 0 .  ibuprofen (ADVIL,MOTRIN) 600 MG tablet, Take 1 tablet (600 mg total) by mouth every 6 (six) hours as needed. (Patient not taking: Reported on 03/28/2017), Disp: 21 tablet, Rfl: 0 .  sildenafil (VIAGRA) 50 MG tablet, Take 1 tablet (50 mg total) by mouth daily as needed for erectile dysfunction. (Patient not taking: Reported on 03/14/2017), Disp: 10 tablet, Rfl: 0 Allergies  Allergen Reactions  . Lisinopril Cough      Social History   Socioeconomic  History  . Marital status: Single    Spouse name: Not on file  . Number of children: 2  . Years of education: Not on file  . Highest education level: Not on file  Occupational History  . Occupation: N/A    Employer: NOT EMPLOYED  Social Needs  . Financial resource strain: Not on file  . Food insecurity:    Worry: Not on file    Inability: Not on file  . Transportation needs:    Medical: Not on file    Non-medical: Not on file  Tobacco Use  . Smoking status: Former Smoker    Packs/day: 1.50    Years: 3.00    Pack years: 4.50    Types: Cigarettes    Last attempt to quit: 10/31/2013    Years since quitting: 3.6  . Smokeless tobacco: Never Used  Substance and Sexual Activity  . Alcohol use: No    Alcohol/week: 0.6 oz    Types: 1 Cans of beer per week  . Drug use: No    Types: Cocaine    Comment: 10/31/2013 "might use cocaine once/month" - Has not used since 2015.   Marland Kitchen Sexual activity: Never  Lifestyle  . Physical activity:    Days per week: Not on file    Minutes per session: Not on file  . Stress: Not on file  Relationships  . Social connections:  Talks on phone: Not on file    Gets together: Not on file    Attends religious service: Not on file    Active member of club or organization: Not on file    Attends meetings of clubs or organizations: Not on file    Relationship status: Not on file  . Intimate partner violence:    Fear of current or ex partner: Not on file    Emotionally abused: Not on file    Physically abused: Not on file    Forced sexual activity: Not on file  Other Topics Concern  . Not on file  Social History Narrative  . Not on file    Physical Exam  Constitutional: He is oriented to person, place, and time.  Cardiovascular: Normal rate and regular rhythm.  Pulmonary/Chest: Effort normal and breath sounds normal.  Abdominal: Soft. He exhibits no distension.  Musculoskeletal: Normal range of motion. He exhibits no edema.  Neurological: He is  alert and oriented to person, place, and time.  Skin: Skin is warm and dry.  Psychiatric: He has a normal mood and affect.        Future Appointments  Date Time Provider [place]  06/14/2017  1:30 PM [facility] PA/NP [facility] None    BP (!) 138/106 (BP Location: Left Arm, Patient Position: Sitting, Cuff Size: Large)   Pulse 80   Resp 16   Wt 278 lb (126.1 kg)   SpO2 97%   BMI 47.72 kg/m   Weight yesterday- Did not weigh Last visit weight- 275 lb   Douglas [name] was seen at home today and reported feeling well. He stated that he has been taking his medications but wants to talk to someone about the possibility of stopping because he thinks he can manage his disease process through diet and exercise. I pleaded with him to continue his medications until he has a conversation with one of the providers at the clinic and he was agreeable. I verified his medications and restocked his pillbox. His weight was up by 5 pounds since Friday. He said he ate pizza and a burger and hotdog over the weekend and did not take his medications yesterday. Per the clinic, he does not need to change anything and they will reassess at his next appointment. He is currently having trouble with his power bill and affording food. He has information on food pantries and is going to have his sister take him to apply for food stamps tomorrow. I have also contacted Kennyth Lose at the clinic to talk with him at his appointment on Wednesday.   Time spent with patient: 44 minutes  Douglas [name], EMT 06/12/17  ACTION: Home visit completed Next visit planned for 1 week

## 2017-06-13 NOTE — Progress Notes (Signed)
Patient ID: [name], male   DOB: [birth year], 32 y.o.   MRN: [id number]    Advanced Heart Failure Clinic Note   PCP: none.  Primary Cardiologist: Dr Aundra Dubin  Pulmonary  HPI: Mr Bob is a 32 yo with OHS/OSA, morbid obesity, HTN, diastolic CHF with prominent RV failure.   Admitted in August 2016  to [facility] with volume overload. Diuresed with lasix drip and diamox. Transitioned to lasix 80 mg twice a day. Discharge weight 257 pounds.   Admit to [facility] 3/21 through 04/28/15 with respiratory distress. Required short term intubation. Apparently had not been wearing BiPap. Discharge weight was 259 pounds.   Today he returns for HF follow up. He had a 5 lb weight gain over the weekend after dietary and medication noncompliance. Overall he is doing well. No SOB, orthopnea, PND, or edema. No CP or dizziness. He is wearing 2 L O2 at all times. Not wearing CPAP, but working on getting a new mask. He ate a hotdog and hambruger over the weekend and had weight gain. Reports that weights are now back down to baseline ~279 lbs. Was trying to wean himself off medications, but after talking with Thedore Mins (paramedic), he is now taking all meds. Missed medications on Sunday. Followed by HF Paramedicine. Tries to limit fluid and salt.   - ECHO 03/20/2014 EF 60-65% - ECHO 8/18: EF 55-60%, grade II diastolic dydsfunction, D-shaped septum suggesting RV pressure/volume overload, severe RV dilation with mild-moderately decreased RV systolic function, unable to estimate PA pressure.   Labs 09/21/2014: K 3.9 Creatinine 1.03  Labs 11/26/2014: K 4.2 Creatinine 1.08  Labs 12/30/2014: K 4.7 Creatinine 1.05  Labs 04/26/2015: K 4.0 Creatinine 0.94  Labs 10/17: K 4.1, creatinine 1.17 Labs 8/18: K 4.4, creatinine 1.26 Labs 11/21/2016: K 4.2 Creatinine 1.25   Review of systems complete and found to be negative unless listed in HPI.   SH:  Social History   Socioeconomic History  . Marital status: Single    Spouse name:  Not on file  . Number of children: 2  . Years of education: Not on file  . Highest education level: Not on file  Occupational History  . Occupation: N/A    Employer: NOT EMPLOYED  Social Needs  . Financial resource strain: Not on file  . Food insecurity:    Worry: Not on file    Inability: Not on file  . Transportation needs:    Medical: Not on file    Non-medical: Not on file  Tobacco Use  . Smoking status: Former Smoker    Packs/day: 1.50    Years: 3.00    Pack years: 4.50    Types: Cigarettes    Last attempt to quit: 10/31/2013    Years since quitting: 3.6  . Smokeless tobacco: Never Used  Substance and Sexual Activity  . Alcohol use: No    Alcohol/week: 0.6 oz    Types: 1 Cans of beer per week  . Drug use: No    Types: Cocaine    Comment: 10/31/2013 "might use cocaine once/month" - Has not used since 2015.   Marland Kitchen Sexual activity: Never  Lifestyle  . Physical activity:    Days per week: Not on file    Minutes per session: Not on file  . Stress: Not on file  Relationships  . Social connections:    Talks on phone: Not on file    Gets together: Not on file    Attends religious service: Not on  file    Active member of club or organization: Not on file    Attends meetings of clubs or organizations: Not on file    Relationship status: Not on file  . Intimate partner violence:    Fear of current or ex partner: Not on file    Emotionally abused: Not on file    Physically abused: Not on file    Forced sexual activity: Not on file  Other Topics Concern  . Not on file  Social History Narrative  . Not on file    FH:  Family History  Problem Relation Age of Onset  . Heart disease Mother        Pacemaker  . Hypertension Father   . Cancer Paternal Grandfather   . Heart attack Neg Hx   . Stroke Neg Hx     Past Medical History:  Diagnosis Date  . Arthritis    "hands, feet" (10/31/2013)  . CHF (congestive heart failure) ([place])   . Chronic bronchitis ([place])   . High  cholesterol   . Hypertension   . Obesity   . OSA on CPAP   . Pneumonia 09/2011  . Substance abuse ([place]) 8/31/213   cociane "first time use"  . Tobacco abuse     Current Outpatient Medications  Medication Sig Dispense Refill  . carvedilol (COREG) 6.25 MG tablet Take 1.5 tablets (9.375 mg total) by mouth 2 (two) times daily with a meal. 90 tablet 11  . colchicine 0.6 MG tablet Take two tablets as one dose followed one hour later by one tablet. 3 tablet 0  . hydrALAZINE (APRESOLINE) 25 MG tablet Take 0.5 tablets (12.5 mg total) by mouth 3 (three) times daily. 135 tablet 6  . ibuprofen (ADVIL,MOTRIN) 600 MG tablet Take 1 tablet (600 mg total) by mouth every 6 (six) hours as needed. 21 tablet 0  . losartan (COZAAR) 50 MG tablet TAKE ONE (1) TABLET BY MOUTH TWO (2) TIMES DAILY 60 tablet 3  . NON FORMULARY 2L of continuous oxygen    . PROVENTIL HFA 108 (90 Base) MCG/ACT inhaler INHALE 1 TO 2 PUFFS BY MOUTH EVERY 6 (SIX) HOURS AS NEEDED FOR WHEEZING OR SHORTNESS OF BREATH 6.7 g 5  . sildenafil (VIAGRA) 50 MG tablet Take 1 tablet (50 mg total) by mouth daily as needed for erectile dysfunction. 10 tablet 0  . torsemide (DEMADEX) 20 MG tablet TAKE 3 (THREE) TABLETS BY MOUTH TWICE DAILY 180 tablet 3   No current facility-administered medications for this encounter.     Vitals:   06/14/17 1345  BP: (!) 140/92  Pulse: 80  SpO2: 95%  Weight: 281 lb 2 oz (127.5 kg)   Wt Readings from Last 3 Encounters:  06/14/17 281 lb 2 oz (127.5 kg)  06/12/17 278 lb (126.1 kg)  05/24/17 275 lb 6.4 oz (124.9 kg)     PHYSICAL EXAM: General: Obese. No resp difficulty. Walked into clinic with oxygen. HEENT: Normal Neck: Supple. JVP ~7. Carotids 2+ bilat; no bruits. No thyromegaly or nodule noted. Cor: PMI nondisplaced. RRR, No M/G/R noted Lungs: CTAB, normal effort. On 2L O2 Abdomen: obese, soft, non-tender, non-distended, no HSM. No bruits or masses. +BS  Extremities: No cyanosis, clubbing, or rash. R  and LLE no edema.  Neuro: Alert & orientedx3, cranial nerves grossly intact. moves all 4 extremities w/o difficulty. Affect pleasant  ASSESSMENT & PLAN:  1.Chronic diastolic CHF with prominent RV failure: Echo 08/30/16 with D shaped septum and severely enlarged RV. RV  failure in the setting of OSA and OHS.  - NYHA II - Volume status stable. Weight 1 lb down from last clinic visit.  - Continue torsemide 60 mg twice a day. BMET stable 05/25/17 - Continue losartan 50 mg BID - Continue Coreg 9.375 mg BID - No Spiro with history of hyperkalemia.  - Counseled on avoiding high salt foods and limiting fluid intake.    2. OHS/OSA:  - Not wearing CPAP - Zach with paramedicine is helping him get a new mask  3. HTN:   - Elevated today, but just took meds prior to appointment. SBP 110-130 during paramedicine visits  4. RV failure:  - VQ scan without PE.  - Continue 2L O2  No medication changes today.  Follow up in 3 months. Sent message to [place], SW to talk to him about paying for power bill and affording medications.  Georgiana Shore, NP 06/14/2017  Greater than 50% of the 25 minute visit was spent in counseling/coordination of care regarding disease state education, salt/fluid restriction, sliding scale diuretics, and medication compliance.

## 2017-06-14 ENCOUNTER — Encounter (HOSPITAL_COMMUNITY): Payer: Self-pay

## 2017-06-14 ENCOUNTER — Other Ambulatory Visit: Payer: Self-pay

## 2017-06-14 ENCOUNTER — Ambulatory Visit (HOSPITAL_COMMUNITY)
Admission: RE | Admit: 2017-06-14 | Discharge: 2017-06-14 | Disposition: A | Discharge status: Home / Self Care | Payer: Medicaid Other | Source: Ambulatory Visit | Attending: Internal Medicine | Admitting: Internal Medicine

## 2017-06-14 VITALS — BP 140/92 | HR 80 | Wt 281.1 lb

## 2017-06-14 DIAGNOSIS — Z79899 Other long term (current) drug therapy: Secondary | ICD-10-CM | POA: Insufficient documentation

## 2017-06-14 DIAGNOSIS — G4733 Obstructive sleep apnea (adult) (pediatric): Secondary | ICD-10-CM

## 2017-06-14 DIAGNOSIS — I10 Essential (primary) hypertension: Secondary | ICD-10-CM | POA: Diagnosis not present

## 2017-06-14 DIAGNOSIS — Z87891 Personal history of nicotine dependence: Secondary | ICD-10-CM | POA: Insufficient documentation

## 2017-06-14 DIAGNOSIS — I5032 Chronic diastolic (congestive) heart failure: Secondary | ICD-10-CM | POA: Diagnosis not present

## 2017-06-14 DIAGNOSIS — I110 Hypertensive heart disease with heart failure: Secondary | ICD-10-CM | POA: Insufficient documentation

## 2017-06-14 NOTE — Patient Instructions (Signed)
No changes today.  Follow up in 3 months as scheduled.

## 2017-06-23 DIAGNOSIS — Z736 Limitation of activities due to disability: Secondary | ICD-10-CM

## 2017-06-27 ENCOUNTER — Other Ambulatory Visit (HOSPITAL_COMMUNITY): Payer: Self-pay | Admitting: Cardiology

## 2017-06-27 DIAGNOSIS — I5032 Chronic diastolic (congestive) heart failure: Secondary | ICD-10-CM

## 2017-06-29 ENCOUNTER — Other Ambulatory Visit (HOSPITAL_COMMUNITY): Payer: Self-pay

## 2017-06-29 NOTE — Progress Notes (Signed)
Paramedicine Encounter    Patient ID: Douglas [name]., male    DOB: [birth year], 32 y.o.   MRN: [id number]   Patient Care Team: Medicine, [facility] as PCP - General  Patient Active Problem List   Diagnosis Date Noted  . Morbid obesity ([place]) 06/17/2015  . Essential hypertension 04/14/2015  . Hyperkalemia 04/14/2015  . OSA (obstructive sleep apnea)   . Difficult intravenous access   . Chronic diastolic CHF (congestive heart failure), NYHA class 3 ([place]) 09/13/2014  . Cor pulmonale ([place]) 03/19/2014  . OSA treated with BiPAP     Current Outpatient Medications:  .  carvedilol (COREG) 6.25 MG tablet, Take 1.5 tablets (9.375 mg total) by mouth 2 (two) times daily with a meal., Disp: 90 tablet, Rfl: 11 .  colchicine 0.6 MG tablet, Take two tablets as one dose followed one hour later by one tablet., Disp: 3 tablet, Rfl: 0 .  hydrALAZINE (APRESOLINE) 25 MG tablet, Take 0.5 tablets (12.5 mg total) by mouth 3 (three) times daily., Disp: 135 tablet, Rfl: 6 .  ibuprofen (ADVIL,MOTRIN) 600 MG tablet, Take 1 tablet (600 mg total) by mouth every 6 (six) hours as needed., Disp: 21 tablet, Rfl: 0 .  losartan (COZAAR) 50 MG tablet, TAKE ONE TABLET BY MOUTH TWICE DAILY, Disp: 60 tablet, Rfl: 3 .  NON FORMULARY, 2L of continuous oxygen, Disp: , Rfl:  .  PROVENTIL HFA 108 (90 Base) MCG/ACT inhaler, INHALE 1 TO 2 PUFFS BY MOUTH EVERY 6 (SIX) HOURS AS NEEDED FOR WHEEZING OR SHORTNESS OF BREATH, Disp: 6.7 g, Rfl: 5 .  sildenafil (VIAGRA) 50 MG tablet, Take 1 tablet (50 mg total) by mouth daily as needed for erectile dysfunction., Disp: 10 tablet, Rfl: 0 .  torsemide (DEMADEX) 20 MG tablet, TAKE 3 (THREE) TABLETS BY MOUTH TWICE DAILY, Disp: 180 tablet, Rfl: 3 Allergies  Allergen Reactions  . Lisinopril Cough      Social History   Socioeconomic History  . Marital status: Single    Spouse name: Not on file  . Number of children: 2  . Years of education: Not on file  . Highest  education level: Not on file  Occupational History  . Occupation: N/A    Employer: NOT EMPLOYED  Social Needs  . Financial resource strain: Not on file  . Food insecurity:    Worry: Not on file    Inability: Not on file  . Transportation needs:    Medical: Not on file    Non-medical: Not on file  Tobacco Use  . Smoking status: Former Smoker    Packs/day: 1.50    Years: 3.00    Pack years: 4.50    Types: Cigarettes    Last attempt to quit: 10/31/2013    Years since quitting: 3.6  . Smokeless tobacco: Never Used  Substance and Sexual Activity  . Alcohol use: No    Alcohol/week: 0.6 oz    Types: 1 Cans of beer per week  . Drug use: No    Types: Cocaine    Comment: 10/31/2013 "might use cocaine once/month" - Has not used since 2015.   Marland Kitchen Sexual activity: Never  Lifestyle  . Physical activity:    Days per week: Not on file    Minutes per session: Not on file  . Stress: Not on file  Relationships  . Social connections:    Talks on phone: Not on file    Gets together: Not on file    Attends religious  service: Not on file    Active member of club or organization: Not on file    Attends meetings of clubs or organizations: Not on file    Relationship status: Not on file  . Intimate partner violence:    Fear of current or ex partner: Not on file    Emotionally abused: Not on file    Physically abused: Not on file    Forced sexual activity: Not on file  Other Topics Concern  . Not on file  Social History Narrative  . Not on file    Physical Exam  Constitutional: He is oriented to person, place, and time.  Cardiovascular: Normal rate and regular rhythm.  Pulmonary/Chest: Effort normal and breath sounds normal.  Abdominal: Soft.  Musculoskeletal: Normal range of motion. He exhibits no edema.  Neurological: He is alert and oriented to person, place, and time.  Skin: Skin is warm and dry.  Psychiatric: He has a normal mood and affect.        Future Appointments  Date  Time Provider [place]  09/14/2017  1:30 PM [facility] PA/NP [facility] None    BP 106/70 (BP Location: Left Arm, Patient Position: Sitting, Cuff Size: Large)   Pulse 86   Resp 16   Wt 276 lb (125.2 kg)   SpO2 94%   BMI 47.38 kg/m   Weight yesterday- 275 lb Last visit weight- 278 lb  [name] was seen at home today and reported feeling well. He denied SOB, dizziness, headache or orthopnea but said he has not been able to fall asleep at night. He said he spends his evenings watching TV or YouTube and ends up staying awake until 03:00 or later and ends up sleeping more of the next day. I have suggested to him to turn off all screens prior to going to bed so that he can relax without the light stimulation. He said he would try this moving forward. His medications were verified and his pillbox was refilled. We spoke about the prospect of a men's HF support group and he was eager to attend and share ideas.  Time spent with patient: 36 minutes  Douglas [name], EMT 06/29/17  ACTION: Home visit completed Next visit planned for 2 weeks

## 2017-07-06 ENCOUNTER — Telehealth (HOSPITAL_COMMUNITY): Payer: Self-pay

## 2017-07-06 NOTE — Telephone Encounter (Signed)
I called Douglas [name] to see how he was doing. We have been extending his visits to every other week and I wanted to check in and make sure he was feeling well. He stated he was and did not need a visit this week and would see me next week unless something changed.

## 2017-07-11 ENCOUNTER — Other Ambulatory Visit (HOSPITAL_COMMUNITY): Payer: Self-pay

## 2017-07-11 NOTE — Progress Notes (Signed)
Paramedicine Encounter    Patient ID: [name], male    DOB: July 16, 1985, 32 y.o.   MRN: [id number]   Patient Care Team: Medicine, [facility] as PCP - General  Patient Active Problem List   Diagnosis Date Noted  . Morbid obesity ([place]) 06/17/2015  . Essential hypertension 04/14/2015  . Hyperkalemia 04/14/2015  . OSA (obstructive sleep apnea)   . Difficult intravenous access   . Chronic diastolic CHF (congestive heart failure), NYHA class 3 ([place]) 09/13/2014  . Cor pulmonale ([place]) 03/19/2014  . OSA treated with BiPAP     Current Outpatient Medications:  .  carvedilol (COREG) 6.25 MG tablet, Take 1.5 tablets (9.375 mg total) by mouth 2 (two) times daily with a meal., Disp: 90 tablet, Rfl: 11 .  hydrALAZINE (APRESOLINE) 25 MG tablet, Take 0.5 tablets (12.5 mg total) by mouth 3 (three) times daily., Disp: 135 tablet, Rfl: 6 .  NON FORMULARY, 2L of continuous oxygen, Disp: , Rfl:  .  PROVENTIL HFA 108 (90 Base) MCG/ACT inhaler, INHALE 1 TO 2 PUFFS BY MOUTH EVERY 6 (SIX) HOURS AS NEEDED FOR WHEEZING OR SHORTNESS OF BREATH, Disp: 6.7 g, Rfl: 5 .  torsemide (DEMADEX) 20 MG tablet, TAKE 3 (THREE) TABLETS BY MOUTH TWICE DAILY, Disp: 180 tablet, Rfl: 3 .  colchicine 0.6 MG tablet, Take two tablets as one dose followed one hour later by one tablet. (Patient not taking: Reported on 07/11/2017), Disp: 3 tablet, Rfl: 0 .  ibuprofen (ADVIL,MOTRIN) 600 MG tablet, Take 1 tablet (600 mg total) by mouth every 6 (six) hours as needed. (Patient not taking: Reported on 07/11/2017), Disp: 21 tablet, Rfl: 0 .  losartan (COZAAR) 50 MG tablet, TAKE ONE TABLET BY MOUTH TWICE DAILY, Disp: 60 tablet, Rfl: 3 .  sildenafil (VIAGRA) 50 MG tablet, Take 1 tablet (50 mg total) by mouth daily as needed for erectile dysfunction. (Patient not taking: Reported on 07/11/2017), Disp: 10 tablet, Rfl: 0 Allergies  Allergen Reactions  . Lisinopril Cough      Social History   Socioeconomic History  .  Marital status: Single    Spouse name: Not on file  . Number of children: 2  . Years of education: Not on file  . Highest education level: Not on file  Occupational History  . Occupation: N/A    Employer: NOT EMPLOYED  Social Needs  . Financial resource strain: Not on file  . Food insecurity:    Worry: Not on file    Inability: Not on file  . Transportation needs:    Medical: Not on file    Non-medical: Not on file  Tobacco Use  . Smoking status: Former Smoker    Packs/day: 1.50    Years: 3.00    Pack years: 4.50    Types: Cigarettes    Last attempt to quit: 10/31/2013    Years since quitting: 3.6  . Smokeless tobacco: Never Used  Substance and Sexual Activity  . Alcohol use: No    Alcohol/week: 0.6 oz    Types: 1 Cans of beer per week  . Drug use: No    Types: Cocaine    Comment: 10/31/2013 "might use cocaine once/month" - Has not used since 2015.   Marland Kitchen Sexual activity: Never  Lifestyle  . Physical activity:    Days per week: Not on file    Minutes per session: Not on file  . Stress: Not on file  Relationships  . Social connections:    Talks  on phone: Not on file    Gets together: Not on file    Attends religious service: Not on file    Active member of club or organization: Not on file    Attends meetings of clubs or organizations: Not on file    Relationship status: Not on file  . Intimate partner violence:    Fear of current or ex partner: Not on file    Emotionally abused: Not on file    Physically abused: Not on file    Forced sexual activity: Not on file  Other Topics Concern  . Not on file  Social History Narrative  . Not on file    Physical Exam  Constitutional: He is oriented to person, place, and time.  Cardiovascular: Normal rate and regular rhythm.  Pulmonary/Chest: Effort normal and breath sounds normal.  Abdominal: Soft.  Musculoskeletal: Normal range of motion. He exhibits no edema.  Neurological: He is alert and oriented to person, place,  and time.  Skin: Skin is warm and dry.  Psychiatric: He has a normal mood and affect.        Future Appointments  Date Time Provider [place]  09/14/2017  1:30 PM [facility] PA/NP [facility] None    BP 100/78 (BP Location: Left Arm, Patient Position: Sitting, Cuff Size: Large)   Pulse 90   Resp 18   Wt 273 lb 6.4 oz (124 kg)   SpO2 92%   BMI 46.93 kg/m   Weight yesterday- 273 lb Last visit weight- 276 lb  My Hinchey was seen at home today and reported feeling generally well. He denied SOB,  Headache, dizziness or orthopnea. He said he has been low on food lately and requested I bring him some because he has been missing some medications because taking them on an empty stomach makes him feel sick. I brought him canned vegetables, tuna, crackers and frozen venison. He also requested bottled water, as it helps him keep up with his fluid consumption. We spoke about budgeting and he stated that he was going to have his sister take him to the social services office to apply for food stamps and he should be able to afford groceries now that his power bill is caught up. His medications were verified and his pillbox was refilled  [name], EMT 07/11/17  ACTION: Home visit completed Next visit planned for 2 weeks

## 2017-08-03 ENCOUNTER — Encounter (HOSPITAL_COMMUNITY): Payer: Self-pay | Admitting: *Deleted

## 2017-08-03 ENCOUNTER — Telehealth (HOSPITAL_COMMUNITY): Payer: Self-pay

## 2017-08-03 NOTE — Telephone Encounter (Signed)
Douglas [name] called me to ask for a letter from the clinic stating that he would be best served by having a ground floor apartment without stairs to navigate. I spoke to clinic staff who agreed and produced the letter while I was present. I will deliver the letter tomorrow.

## 2017-08-04 ENCOUNTER — Other Ambulatory Visit (HOSPITAL_COMMUNITY): Payer: Self-pay

## 2017-08-04 NOTE — Progress Notes (Signed)
I saw Douglas [name] briefly today to drop off the letter from the HF clinic. He reported feeling well and did not need anything further.

## 2017-08-08 ENCOUNTER — Ambulatory Visit (INDEPENDENT_AMBULATORY_CARE_PROVIDER_SITE_OTHER): Payer: Medicaid Other | Admitting: Pulmonary Disease

## 2017-08-08 ENCOUNTER — Encounter: Payer: Self-pay | Admitting: Pulmonary Disease

## 2017-08-08 VITALS — BP 128/76 | HR 87 | Ht 64.0 in | Wt 274.0 lb

## 2017-08-08 DIAGNOSIS — J9611 Chronic respiratory failure with hypoxia: Secondary | ICD-10-CM | POA: Diagnosis not present

## 2017-08-08 DIAGNOSIS — Z6841 Body Mass Index (BMI) 40.0 and over, adult: Secondary | ICD-10-CM

## 2017-08-08 DIAGNOSIS — G4733 Obstructive sleep apnea (adult) (pediatric): Secondary | ICD-10-CM | POA: Diagnosis not present

## 2017-08-08 DIAGNOSIS — E662 Morbid (severe) obesity with alveolar hypoventilation: Secondary | ICD-10-CM

## 2017-08-08 NOTE — Patient Instructions (Signed)
Continue using 2 liters oxygen when walking and when asleep with Bipap  Will arrange for an in lab sleep study and call with results  Follow up in 4 months

## 2017-08-08 NOTE — Progress Notes (Signed)
[facility]  Chief Complaint  Patient presents with  . Follow-up    pt states he is doing well currently.  is not currently wearing bipap, but is interested in starting it back.     Vital signs: BP 128/76 (BP Location: Left Arm, Cuff Size: Normal)   Pulse 87   Ht 5\' 4"  (1.626 m)   Wt 274 lb (124.3 kg)   SpO2 100%   BMI 47.03 kg/m   History of Present Illness: Douglas [name]. is a 32 y.o. male with obstructive sleep apnea, obesity hypoventilation syndrome, and chronic respiratory failure.  He has lost about 60 lbs in the past 8 to 10 months.  He is feeling better and breathing better.  His mother and sisters kept on his case about walking more and changing his diet.    He denies chest pain, cough, wheeze, let swelling.  He hasn't been able to use his Bipap recently.  He says he didn't get correct supplies.  Has been using 2 liters pulsed oxygen.  He isn't sure if he still needs it.  Physical Exam:  Appearance - well kempt  ENMT - nasal mucosa moist, turbinates clear, midline nasal septum, no dental lesions, no gingival bleeding, no oral exudates, no tonsillar hypertrophy, MP 4 Neck - no masses, trachea midline, no thyromegaly, no elevation in JVP Respiratory - normal appearance of chest wall, normal respiratory effort w/o accessory muscle use, no dullness on percussion, no wheezing or rales CV - s1s2 regular rate and rhythm, no murmurs, no peripheral edema, no varicosities, radial pulses symmetric GI - soft, non tender, no masses Lymph - no adenopathy noted in neck and axillary areas MSK - normal muscle strength and tone, normal gait Ext - no cyanosis, clubbing, or joint inflammation noted Skin - no rashes, lesions, or ulcers Neuro - oriented to person, place, and time Psych - normal mood and affect  Assessment/Plan:  Obstructive sleep apnea. - will arrange for repeat titration study to determine whether he still needs  Bipap, or if he can transition to CPAP - might also be able to transition off oxygen at night - advised him to call office if he has trouble getting supplies  Chronic hypoxic/hypercapnic respiratory failure with cor pulmonale 2nd to obesity hypoventilation syndrome. - has improved with weight loss - can use 2 liters oxygen with exertion and Bipap for now - reassess needs after CPAP titration study  Morbid obesity. - he is making excellent progress - encouraged him to keep up with diet and exercise regimen  Diastolic heart failure - f/u with cardiology  Cough. - prn albuterol   Patient Instructions  Continue using 2 liters oxygen when walking and when asleep with Bipap  Will arrange for an in lab sleep study and call with results  Follow up in 4 months    [name], MD [place] 08/08/2017, 4:39 PM Pager:  [phone]  Flow Sheet  Sleep tests: PSG 08/14/09 >> AHI 105.4, SaO2 low 56%, BiPAP 21/17 cm H2O Split 02/05/14 >> AHI 78.2, SaO2 low 52%. CPAP 13 cm H2O >> AHI 5.4, still had low SaO2 >> centrals with higher CPAP/EPAP. ONO with 5 liters 02/27/14 >> test time 58 min. Basal SpO2 66%, low SpO2 50%. Spent 55 min with SpO2 < 88%. Bipap 03/05/16 to 06/02/16 >> used on 62 of 90 nights with average 1 hr 28 min.  Average AHI 3.9 with Bipap 22/18 cm H20  Pulmonary tests V/Q  scan 11/01/13 >> upper lobe airtrapping b/l ABG 04/28/15 >> pH 7.37, PCO2 55.5, PO2 65.6 V/Q scan 09/06/16 >> no PE  Cardiac tests Echo 08/30/16 >> mild LVH, EF 55 to 60%, grade 2 DD  Past Medical History: He  has a past medical history of Arthritis, CHF (congestive heart failure) ([place]), Chronic bronchitis ([place]), High cholesterol, Hypertension, Obesity, OSA on CPAP, Pneumonia (09/2011), Substance abuse ([place]) (8/31/213), and Tobacco abuse.  Past Surgical History: He  has a past surgical history that includes No past surgeries and TUBES IN EARS.  Family History: His family  history includes Cancer in his paternal grandfather; Heart disease in his mother; Hypertension in his father.  Social History: He  reports that he quit smoking about 3 years ago. His smoking use included cigarettes. He has a 4.50 pack-year smoking history. He has never used smokeless tobacco. He reports that he does not drink alcohol or use drugs.  Medications: Allergies as of 08/08/2017      Reactions   Lisinopril Cough      Medication List        Accurate as of 08/08/17  4:39 PM. Always use your most recent med list.          carvedilol 6.25 MG tablet Commonly known as:  COREG Take 1.5 tablets (9.375 mg total) by mouth 2 (two) times daily with a meal.   colchicine 0.6 MG tablet Take two tablets as one dose followed one hour later by one tablet.   hydrALAZINE 25 MG tablet Commonly known as:  APRESOLINE Take 0.5 tablets (12.5 mg total) by mouth 3 (three) times daily.   ibuprofen 600 MG tablet Commonly known as:  ADVIL,MOTRIN Take 1 tablet (600 mg total) by mouth every 6 (six) hours as needed.   losartan 50 MG tablet Commonly known as:  COZAAR TAKE ONE TABLET BY MOUTH TWICE DAILY   NON FORMULARY 2L of continuous oxygen   PROVENTIL HFA 108 (90 Base) MCG/ACT inhaler Generic drug:  albuterol INHALE 1 TO 2 PUFFS BY MOUTH EVERY 6 (SIX) HOURS AS NEEDED FOR WHEEZING OR SHORTNESS OF BREATH   sildenafil 50 MG tablet Commonly known as:  VIAGRA Take 1 tablet (50 mg total) by mouth daily as needed for erectile dysfunction.   torsemide 20 MG tablet Commonly known as:  DEMADEX TAKE 3 (THREE) TABLETS BY MOUTH TWICE DAILY

## 2017-08-11 ENCOUNTER — Other Ambulatory Visit (HOSPITAL_COMMUNITY): Payer: Self-pay

## 2017-08-11 NOTE — Progress Notes (Signed)
Paramedicine Encounter    Patient ID: Douglas [name]., male    DOB: Feb 05, 1985, 32 y.o.   MRN: [id number]   Patient Care Team: Medicine, [facility] as PCP - General  Patient Active Problem List   Diagnosis Date Noted  . Morbid obesity ([place]) 06/17/2015  . Essential hypertension 04/14/2015  . Hyperkalemia 04/14/2015  . OSA (obstructive sleep apnea)   . Difficult intravenous access   . Chronic diastolic CHF (congestive heart failure), NYHA class 3 ([place]) 09/13/2014  . Cor pulmonale ([place]) 03/19/2014  . OSA treated with BiPAP     Current Outpatient Medications:  .  carvedilol (COREG) 6.25 MG tablet, Take 1.5 tablets (9.375 mg total) by mouth 2 (two) times daily with a meal., Disp: 90 tablet, Rfl: 11 .  hydrALAZINE (APRESOLINE) 25 MG tablet, Take 0.5 tablets (12.5 mg total) by mouth 3 (three) times daily., Disp: 135 tablet, Rfl: 6 .  losartan (COZAAR) 50 MG tablet, TAKE ONE TABLET BY MOUTH TWICE DAILY, Disp: 60 tablet, Rfl: 3 .  NON FORMULARY, 2L of continuous oxygen, Disp: , Rfl:  .  PROVENTIL HFA 108 (90 Base) MCG/ACT inhaler, INHALE 1 TO 2 PUFFS BY MOUTH EVERY 6 (SIX) HOURS AS NEEDED FOR WHEEZING OR SHORTNESS OF BREATH, Disp: 6.7 g, Rfl: 5 .  torsemide (DEMADEX) 20 MG tablet, TAKE 3 (THREE) TABLETS BY MOUTH TWICE DAILY, Disp: 180 tablet, Rfl: 3 .  colchicine 0.6 MG tablet, Take two tablets as one dose followed one hour later by one tablet. (Patient not taking: Reported on 08/11/2017), Disp: 3 tablet, Rfl: 0 .  ibuprofen (ADVIL,MOTRIN) 600 MG tablet, Take 1 tablet (600 mg total) by mouth every 6 (six) hours as needed. (Patient not taking: Reported on 08/11/2017), Disp: 21 tablet, Rfl: 0 .  sildenafil (VIAGRA) 50 MG tablet, Take 1 tablet (50 mg total) by mouth daily as needed for erectile dysfunction. (Patient not taking: Reported on 08/11/2017), Disp: 10 tablet, Rfl: 0 Allergies  Allergen Reactions  . Lisinopril Cough      Social History   Socioeconomic History  .  Marital status: Single    Spouse name: Not on file  . Number of children: 2  . Years of education: Not on file  . Highest education level: Not on file  Occupational History  . Occupation: N/A    Employer: NOT EMPLOYED  Social Needs  . Financial resource strain: Not on file  . Food insecurity:    Worry: Not on file    Inability: Not on file  . Transportation needs:    Medical: Not on file    Non-medical: Not on file  Tobacco Use  . Smoking status: Former Smoker    Packs/day: 1.50    Years: 3.00    Pack years: 4.50    Types: Cigarettes    Last attempt to quit: 10/31/2013    Years since quitting: 3.7  . Smokeless tobacco: Never Used  Substance and Sexual Activity  . Alcohol use: No    Alcohol/week: 0.6 oz    Types: 1 Cans of beer per week  . Drug use: No    Types: Cocaine    Comment: 10/31/2013 "might use cocaine once/month" - Has not used since 2015.   Marland Kitchen Sexual activity: Never  Lifestyle  . Physical activity:    Days per week: Not on file    Minutes per session: Not on file  . Stress: Not on file  Relationships  . Social connections:    Talks  on phone: Not on file    Gets together: Not on file    Attends religious service: Not on file    Active member of club or organization: Not on file    Attends meetings of clubs or organizations: Not on file    Relationship status: Not on file  . Intimate partner violence:    Fear of current or ex partner: Not on file    Emotionally abused: Not on file    Physically abused: Not on file    Forced sexual activity: Not on file  Other Topics Concern  . Not on file  Social History Narrative  . Not on file    Physical Exam  Constitutional: He is oriented to person, place, and time.  Cardiovascular: Normal rate and regular rhythm.  Pulmonary/Chest: Effort normal and breath sounds normal.  Abdominal: Soft.  Musculoskeletal: Normal range of motion. He exhibits no edema.  Neurological: He is alert and oriented to person, place,  and time.  Skin: Skin is warm and dry.  Psychiatric: He has a normal mood and affect.        Future Appointments  Date Time Provider [place]  09/11/2017  8:00 PM [facility] ROOM 3 [facility]  09/14/2017  1:30 PM [facility] PA/NP [facility] None    BP 130/86 (BP Location: Left Arm, Patient Position: Sitting, Cuff Size: Large)   Pulse 78   Resp 18   Wt 275 lb (124.7 kg)   SpO2 90%   BMI 47.20 kg/m   Weight yesterday- 274.6 lb Last visit weight- 273 lb  [name] was seen at home today and reported feeling well. He had recently seen his pulmonologist who said he only needs to wear oxygen while he is exerting himself and with his CPAP when he sleeps. He denied SOB, headaches, dizziness or orthopnea. His medications were verified and his pillbox was refilled. He requested back-to-school items for his kids. His daughter wears size 8 in pants and medium to large in shirt. His son wears a size 8 pant and size 10-12 shirt. I asked [name] to confirm these sizes by next week along with getting shoe sizes. He continues to look for work under the table and asks that I keep an eye out for something. He is also expecting to be moving from his current apartment but thinks it may not be until November.   Douglas [name], EMT 08/11/17  ACTION: Home visit completed Next visit planned for 1 week

## 2017-08-17 ENCOUNTER — Telehealth (HOSPITAL_COMMUNITY): Payer: Self-pay

## 2017-08-17 ENCOUNTER — Other Ambulatory Visit (HOSPITAL_COMMUNITY): Payer: Self-pay | Admitting: Cardiology

## 2017-08-17 NOTE — Telephone Encounter (Signed)
I called Douglas [name] to schedule an appointment. He did not answer so I left a voicemail requesting he call me back.  

## 2017-08-18 ENCOUNTER — Other Ambulatory Visit (HOSPITAL_COMMUNITY): Payer: Self-pay

## 2017-08-18 NOTE — Progress Notes (Signed)
Paramedicine Encounter    Patient ID: [name], male    DOB: 01/19/1986, 32 y.o.   MRN: [id number]   Patient Care Team: Medicine, [facility] as PCP - General  Patient Active Problem List   Diagnosis Date Noted  . Morbid obesity ([place]) 06/17/2015  . Essential hypertension 04/14/2015  . Hyperkalemia 04/14/2015  . OSA (obstructive sleep apnea)   . Difficult intravenous access   . Chronic diastolic CHF (congestive heart failure), NYHA class 3 ([place]) 09/13/2014  . Cor pulmonale ([place]) 03/19/2014  . OSA treated with BiPAP     Current Outpatient Medications:  .  carvedilol (COREG) 6.25 MG tablet, TAKE 1 AND 1/2 TABLET BY MOUTH TWICE A DAY, Disp: 90 tablet, Rfl: 5 .  hydrALAZINE (APRESOLINE) 25 MG tablet, Take 0.5 tablets (12.5 mg total) by mouth 3 (three) times daily., Disp: 135 tablet, Rfl: 6 .  losartan (COZAAR) 50 MG tablet, TAKE ONE TABLET BY MOUTH TWICE DAILY, Disp: 60 tablet, Rfl: 3 .  NON FORMULARY, 2L of continuous oxygen, Disp: , Rfl:  .  torsemide (DEMADEX) 20 MG tablet, TAKE 3 (THREE) TABLETS BY MOUTH TWICE DAILY, Disp: 180 tablet, Rfl: 3 .  colchicine 0.6 MG tablet, Take two tablets as one dose followed one hour later by one tablet. (Patient not taking: Reported on 08/11/2017), Disp: 3 tablet, Rfl: 0 .  ibuprofen (ADVIL,MOTRIN) 600 MG tablet, Take 1 tablet (600 mg total) by mouth every 6 (six) hours as needed. (Patient not taking: Reported on 08/11/2017), Disp: 21 tablet, Rfl: 0 .  PROVENTIL HFA 108 (90 Base) MCG/ACT inhaler, INHALE 1 TO 2 PUFFS BY MOUTH EVERY 6 (SIX) HOURS AS NEEDED FOR WHEEZING OR SHORTNESS OF BREATH (Patient not taking: Reported on 08/18/2017), Disp: 6.7 g, Rfl: 5 .  sildenafil (VIAGRA) 50 MG tablet, Take 1 tablet (50 mg total) by mouth daily as needed for erectile dysfunction. (Patient not taking: Reported on 08/11/2017), Disp: 10 tablet, Rfl: 0 Allergies  Allergen Reactions  . Lisinopril Cough      Social History   Socioeconomic  History  . Marital status: Single    Spouse name: Not on file  . Number of children: 2  . Years of education: Not on file  . Highest education level: Not on file  Occupational History  . Occupation: N/A    Employer: NOT EMPLOYED  Social Needs  . Financial resource strain: Not on file  . Food insecurity:    Worry: Not on file    Inability: Not on file  . Transportation needs:    Medical: Not on file    Non-medical: Not on file  Tobacco Use  . Smoking status: Former Smoker    Packs/day: 1.50    Years: 3.00    Pack years: 4.50    Types: Cigarettes    Last attempt to quit: 10/31/2013    Years since quitting: 3.8  . Smokeless tobacco: Never Used  Substance and Sexual Activity  . Alcohol use: No    Alcohol/week: 0.6 oz    Types: 1 Cans of beer per week  . Drug use: No    Types: Cocaine    Comment: 10/31/2013 "might use cocaine once/month" - Has not used since 2015.   Marland Kitchen Sexual activity: Never  Lifestyle  . Physical activity:    Days per week: Not on file    Minutes per session: Not on file  . Stress: Not on file  Relationships  . Social connections:  Talks on phone: Not on file    Gets together: Not on file    Attends religious service: Not on file    Active member of club or organization: Not on file    Attends meetings of clubs or organizations: Not on file    Relationship status: Not on file  . Intimate partner violence:    Fear of current or ex partner: Not on file    Emotionally abused: Not on file    Physically abused: Not on file    Forced sexual activity: Not on file  Other Topics Concern  . Not on file  Social History Narrative  . Not on file    Physical Exam  Constitutional: He is oriented to person, place, and time.  Cardiovascular: Normal rate and regular rhythm.  Pulmonary/Chest: Effort normal and breath sounds normal.  Abdominal: Soft.  Musculoskeletal: Normal range of motion. He exhibits no edema.  Neurological: He is alert and oriented to  person, place, and time.  Skin: Skin is warm and dry.  Psychiatric: He has a normal mood and affect.        Future Appointments  Date Time Provider [place]  09/11/2017  8:00 PM [facility] ROOM 3 [facility]  09/14/2017  1:30 PM [facility] PA/NP [facility] None    BP 114/78 (BP Location: Left Arm, Patient Position: Sitting, Cuff Size: Large)   Pulse 78   Resp 16   Wt 271 lb 3.2 oz (123 kg)   SpO2 97%   BMI 46.55 kg/m   Weight yesterday- 269 lb Last visit weight- 275 lb  Mr Cornelio was seen at home today and reported feeling generally well. He denied SOB, headache, dizziness or orthopnea. He has been continuing to use his oxygen most of the time even though his pulmonologist reduced it to PRN. He says he does not feel SOB without the oxygen but he just feels better having it on. He was having some trouble with his portable concentrator and he was on the phone with the manufacturer's help-line when I arrived. After he finished up with the phone call he told me that the concentrator was not blowing air when he inhaled. After inspecting the unit I noted it was turned down to "Setting 1" so I turned it up to "Setting 4." This resolved the issue but he has an appointment scheduled with a technician on Monday just in case the fix was temporary. His medications were verified and his pillbox was refilled. I noted that his pill count was high for when he had them filed last and he said he has been skipping days when he doesn't have any food to eat because he gets sick taking them on an empty stomach. He has been telling me for the past few weeks that he was going to get food stamps however this still has not happened. I encouraged him to call his case worker at [facility] and talk to her about his situation. I also confirmed that he still have the food pantry information I have previously given him. I asked that next week he have a list of his expenses and his monthly income so we can work on a budget  together. Additionally, I am encouraging him to look for part time work and he seems to be agreeable. I will discuss further with Raquel Sarna, LCSW, next week.   [name], EMT 08/18/17  ACTION: Home visit completed Next visit planned for 1 week

## 2017-08-24 ENCOUNTER — Telehealth (HOSPITAL_COMMUNITY): Payer: Self-pay

## 2017-08-24 NOTE — Telephone Encounter (Signed)
Douglas [name] returned my phone call and stated he would not be available until tomorrow because he was at his sister's house doing laundry and did not expect to be home until 17:00 or 18:00. He stated he would be available tomorrow at 12:00 so we agreed on meeting then.

## 2017-08-24 NOTE — Telephone Encounter (Signed)
I called Douglas [name] to schedule an appointment. He did not answer so I left a message requesting her call me back.

## 2017-08-25 ENCOUNTER — Telehealth (HOSPITAL_COMMUNITY): Payer: Self-pay

## 2017-08-25 NOTE — Telephone Encounter (Signed)
Douglas [name] called me today to advise he would not be home at the time of our scheduled appointment. He stated he was at his mothers apartment and asked if we could reschedule to Tuesday at 13:00 and I agreed.

## 2017-08-29 ENCOUNTER — Telehealth (HOSPITAL_COMMUNITY): Payer: Self-pay

## 2017-08-29 NOTE — Telephone Encounter (Signed)
Douglas [name] returned my phone call saying he never head it ring and advised that he was home but in the back room with his kids so he did not hear me knocking on there door. I told him I would be unavailable to come back but I could catch up with him later this week. He was agreeable with this.

## 2017-08-29 NOTE — Telephone Encounter (Signed)
I called [name] from his front door today. He did not answer and no voicemail was available.

## 2017-09-06 ENCOUNTER — Encounter (HOSPITAL_COMMUNITY): Payer: Self-pay | Admitting: Family Medicine

## 2017-09-06 ENCOUNTER — Ambulatory Visit (INDEPENDENT_AMBULATORY_CARE_PROVIDER_SITE_OTHER): Payer: Medicaid Other

## 2017-09-06 ENCOUNTER — Ambulatory Visit (HOSPITAL_COMMUNITY)
Admission: EM | Admit: 2017-09-06 | Discharge: 2017-09-06 | Disposition: A | Discharge status: Home / Self Care | Payer: Medicaid Other | Attending: Family Medicine | Admitting: Family Medicine

## 2017-09-06 DIAGNOSIS — M79672 Pain in left foot: Secondary | ICD-10-CM | POA: Diagnosis not present

## 2017-09-06 DIAGNOSIS — S93602A Unspecified sprain of left foot, initial encounter: Secondary | ICD-10-CM

## 2017-09-06 MED ORDER — HYDROCODONE-ACETAMINOPHEN 5-325 MG PO TABS: 1.0000 | ORAL_TABLET | Freq: Four times a day (QID) | ORAL | 0 refills | Status: DC | PRN

## 2017-09-06 NOTE — ED Triage Notes (Signed)
Pt states he rolled out of bed while asleep and bent his L big toe back, c/o ongoing pain in toe and foot.

## 2017-09-06 NOTE — Discharge Instructions (Signed)
Keep foot elevated and apply ice intermittently for the next day or so.

## 2017-09-06 NOTE — ED Provider Notes (Signed)
[facility]    CSN: [id number] Arrival date & time: 09/06/17  1442     History   Chief Complaint Chief Complaint  Patient presents with  . Foot Pain    HPI Douglas [name]. is a 32 y.o. male.   Pt states he rolled out of bed while asleep and bent his L big toe back, c/o ongoing pain in toe and foot. He has been able to walk on outside of foot and heel.  No ankle pain, minimal foot swelling     Past Medical History:  Diagnosis Date  . Arthritis    "hands, feet" (10/31/2013)  . CHF (congestive heart failure) ([place])   . Chronic bronchitis ([place])   . High cholesterol   . Hypertension   . Obesity   . OSA on CPAP   . Pneumonia 09/2011  . Substance abuse ([place]) 8/31/213   cociane "first time use"  . Tobacco abuse     Patient Active Problem List   Diagnosis Date Noted  . Morbid obesity ([place]) 06/17/2015  . Essential hypertension 04/14/2015  . Hyperkalemia 04/14/2015  . OSA (obstructive sleep apnea)   . Difficult intravenous access   . Chronic diastolic CHF (congestive heart failure), NYHA class 3 ([place]) 09/13/2014  . Cor pulmonale ([place]) 03/19/2014  . OSA treated with BiPAP     Past Surgical History:  Procedure Laterality Date  . NO PAST SURGERIES    . TUBES IN EARS         Home Medications    Prior to Admission medications   Medication Sig Start Date End Date Taking? Authorizing Provider  carvedilol (COREG) 6.25 MG tablet TAKE 1 AND 1/2 TABLET BY MOUTH TWICE A DAY 08/17/17  Yes Larey Dresser, MD  hydrALAZINE (APRESOLINE) 25 MG tablet Take 0.5 tablets (12.5 mg total) by mouth 3 (three) times daily. 09/20/16 06/15/18 Yes Arbutus Leas, NP  losartan (COZAAR) 50 MG tablet TAKE ONE TABLET BY MOUTH TWICE DAILY 06/27/17  Yes Larey Dresser, MD  torsemide (DEMADEX) 20 MG tablet TAKE 3 (THREE) TABLETS BY MOUTH TWICE DAILY 05/22/17  Yes Larey Dresser, MD  HYDROcodone-acetaminophen (NORCO) 5-325 MG tablet Take 1 tablet by mouth every 6 (six) hours as  needed for moderate pain. 09/06/17   Robyn Haber, MD  NON FORMULARY 2L of continuous oxygen    [provider]    Family History Family History  Problem Relation Age of Onset  . Heart disease Mother        Pacemaker  . Hypertension Father   . Cancer Paternal Grandfather   . Heart attack Neg Hx   . Stroke Neg Hx     Social History Social History   Tobacco Use  . Smoking status: Former Smoker    Packs/day: 1.50    Years: 3.00    Pack years: 4.50    Types: Cigarettes    Last attempt to quit: 10/31/2013    Years since quitting: 3.8  . Smokeless tobacco: Never Used  Substance Use Topics  . Alcohol use: No    Alcohol/week: 1.0 standard drinks    Types: 1 Cans of beer per week  . Drug use: No    Types: Cocaine    Comment: 10/31/2013 "might use cocaine once/month" - Has not used since 2015.      Allergies   Lisinopril   Review of Systems Review of Systems  Constitutional: Negative.   Respiratory: Negative.   Cardiovascular: Negative.   Musculoskeletal:  Positive for gait problem.     Physical Exam Triage Vital Signs ED Triage Vitals [09/06/17 1456]  Enc Vitals Group     BP (!) 145/82     Pulse Rate (!) 101     Resp 20     Temp 98.2 F (36.8 C)     Temp Source Oral     SpO2 95 %     Weight      Height      Head Circumference      Peak Flow      Pain Score      Pain Loc      Pain Edu?      Excl. in [place]?    No data found.  Updated Vital Signs BP (!) 145/82 (BP Location: Right Arm)   Pulse (!) 101   Temp 98.2 F (36.8 C) (Oral)   Resp 20   SpO2 95%    Physical Exam  Constitutional: He is oriented to person, place, and time. He appears well-developed and well-nourished.  Morbidly obese  HENT:  Head: Normocephalic and atraumatic.  Eyes: Pupils are equal, round, and reactive to light.  Neck: Normal range of motion. Neck supple.  Pulmonary/Chest: Effort normal.  Musculoskeletal: He exhibits tenderness. He exhibits no edema or  deformity.  Pain with flexion of left great toe  Neurological: He is alert and oriented to person, place, and time.  Skin: Skin is warm and dry.  Lichenified, thickened skin diffusely left foot.  No skin breaks or edema.  Some hyperpigmentation of distal left foot.  Nursing note and vitals reviewed.    UC Treatments / Results  Labs (all labs ordered are listed, but only abnormal results are displayed) Labs Reviewed - No data to display  EKG None  Radiology Dg Foot Complete Left  Result Date: 09/06/2017 CLINICAL DATA:  [place] out of bed 1 week ago.  Pain of the great toe. EXAM: LEFT FOOT - COMPLETE 3+ VIEW COMPARISON:  None. FINDINGS: Patient does appear to have some nonspecific soft tissue swelling of the foot. No visible fracture or dislocation. Mild osteoarthritis of the MTP joint of the great toe. IMPRESSION: Nonspecific soft tissue swelling of the foot. No fracture or dislocation seen. Electronically Signed   By: Nelson Chimes M.D.   On: 09/06/2017 15:15    Procedures Procedures (including critical care time)  Medications Ordered in UC Medications - No data to display  Initial Impression / Assessment and Plan / UC Course  I have reviewed the triage vital signs and the nursing notes.  Pertinent labs & imaging results that were available during my care of the patient were reviewed by me and considered in my medical decision making (see chart for details).    Final Clinical Impressions(s) / UC Diagnoses   Final diagnoses:  Sprain of left foot, initial encounter     Discharge Instructions     Keep foot elevated and apply ice intermittently for the next day or so.      ED Prescriptions    Medication Sig Dispense Auth. Provider   HYDROcodone-acetaminophen (NORCO) 5-325 MG tablet Take 1 tablet by mouth every 6 (six) hours as needed for moderate pain. 12 tablet Robyn Haber, MD     Controlled Substance Prescriptions [place] Controlled Substance Registry consulted? Not  Applicable   Robyn Haber, MD 09/06/17 [phone]

## 2017-09-07 ENCOUNTER — Encounter (HOSPITAL_BASED_OUTPATIENT_CLINIC_OR_DEPARTMENT_OTHER): Payer: Medicaid Other

## 2017-09-11 ENCOUNTER — Ambulatory Visit (HOSPITAL_BASED_OUTPATIENT_CLINIC_OR_DEPARTMENT_OTHER): Payer: Medicaid Other | Attending: Pulmonary Disease

## 2017-09-12 ENCOUNTER — Other Ambulatory Visit (HOSPITAL_COMMUNITY): Payer: Self-pay

## 2017-09-12 MED ORDER — HYDRALAZINE HCL 25 MG PO TABS: 12.5000 mg | ORAL_TABLET | Freq: Three times a day (TID) | ORAL | 1 refills | Status: DC

## 2017-09-14 ENCOUNTER — Encounter (HOSPITAL_COMMUNITY): Payer: Medicaid Other

## 2017-09-14 ENCOUNTER — Other Ambulatory Visit (HOSPITAL_COMMUNITY): Payer: Self-pay

## 2017-09-14 NOTE — Progress Notes (Signed)
Paramedicine Encounter    Patient ID: Douglas Madura., male    DOB: 28-May-1985, 32 y.o.   MRN: [id number]   Patient Care Team: Medicine, [facility] as PCP - General  Patient Active Problem List   Diagnosis Date Noted  . Morbid obesity ([place]) 06/17/2015  . Essential hypertension 04/14/2015  . Hyperkalemia 04/14/2015  . OSA (obstructive sleep apnea)   . Difficult intravenous access   . Chronic diastolic CHF (congestive heart failure), NYHA class 3 ([place]) 09/13/2014  . Cor pulmonale ([place]) 03/19/2014  . OSA treated with BiPAP     Current Outpatient Medications:  .  carvedilol (COREG) 6.25 MG tablet, TAKE 1 AND 1/2 TABLET BY MOUTH TWICE A DAY, Disp: 90 tablet, Rfl: 5 .  hydrALAZINE (APRESOLINE) 25 MG tablet, Take 0.5 tablets (12.5 mg total) by mouth 3 (three) times daily., Disp: 135 tablet, Rfl: 1 .  losartan (COZAAR) 50 MG tablet, TAKE ONE TABLET BY MOUTH TWICE DAILY, Disp: 60 tablet, Rfl: 3 .  NON FORMULARY, 2L of continuous oxygen, Disp: , Rfl:  .  torsemide (DEMADEX) 20 MG tablet, TAKE 3 (THREE) TABLETS BY MOUTH TWICE DAILY, Disp: 180 tablet, Rfl: 3 .  HYDROcodone-acetaminophen (NORCO) 5-325 MG tablet, Take 1 tablet by mouth every 6 (six) hours as needed for moderate pain. (Patient not taking: Reported on 09/14/2017), Disp: 12 tablet, Rfl: 0 Allergies  Allergen Reactions  . Lisinopril Cough      Social History   Socioeconomic History  . Marital status: Single    Spouse name: Not on file  . Number of children: 2  . Years of education: Not on file  . Highest education level: Not on file  Occupational History  . Occupation: N/A    Employer: NOT EMPLOYED  Social Needs  . Financial resource strain: Not on file  . Food insecurity:    Worry: Not on file    Inability: Not on file  . Transportation needs:    Medical: Not on file    Non-medical: Not on file  Tobacco Use  . Smoking status: Former Smoker    Packs/day: 1.50    Years: 3.00    Pack years:  4.50    Types: Cigarettes    Last attempt to quit: 10/31/2013    Years since quitting: 3.8  . Smokeless tobacco: Never Used  Substance and Sexual Activity  . Alcohol use: No    Alcohol/week: 1.0 standard drinks    Types: 1 Cans of beer per week  . Drug use: No    Types: Cocaine    Comment: 10/31/2013 "might use cocaine once/month" - Has not used since 2015.   Marland Kitchen Sexual activity: Never  Lifestyle  . Physical activity:    Days per week: Not on file    Minutes per session: Not on file  . Stress: Not on file  Relationships  . Social connections:    Talks on phone: Not on file    Gets together: Not on file    Attends religious service: Not on file    Active member of club or organization: Not on file    Attends meetings of clubs or organizations: Not on file    Relationship status: Not on file  . Intimate partner violence:    Fear of current or ex partner: Not on file    Emotionally abused: Not on file    Physically abused: Not on file    Forced sexual activity: Not on file  Other Topics  Concern  . Not on file  Social History Narrative  . Not on file    Physical Exam  Constitutional: He is oriented to person, place, and time.  Cardiovascular: Normal rate and regular rhythm.  Pulmonary/Chest: Effort normal and breath sounds normal.  Abdominal: Soft.  Musculoskeletal: Normal range of motion. He exhibits no edema.  Neurological: He is alert and oriented to person, place, and time.  Skin: Skin is warm and dry.  Psychiatric: He has a normal mood and affect.        Future Appointments  Date Time Provider [place]  09/26/2017 12:00 PM [facility] PA/NP [facility] None    BP 124/80 (BP Location: Left Arm, Patient Position: Sitting, Cuff Size: Large)   Pulse 80   Resp 16   SpO2 (!) 86%   Weight yesterday- UTO Last visit weight- 271 lb  Douglas [name] was seen at home today and reported feeling well. He denied SOB, headache, dizziness or orthopnea. He has been taking his  medications but has stopped using his pillbox because he prefers to just take them out of the pill bottles. He was able to say what he is taking and when he is taking it without difficulty or looking at his bottles. He was found to have a pulse oximeter reading of 86% but said he had been off his home oxygen for a while and insisted he was not SOB. I delivered the school supplies from the clinic and he was very appreciative. Nothing further was necessary during our visit today.   [name], EMT 09/14/17  ACTION: Home visit completed Next visit planned for 2 weeks

## 2017-09-26 ENCOUNTER — Encounter (HOSPITAL_COMMUNITY): Payer: Medicaid Other

## 2017-10-04 ENCOUNTER — Ambulatory Visit (HOSPITAL_COMMUNITY)
Admission: RE | Admit: 2017-10-04 | Discharge: 2017-10-04 | Disposition: A | Discharge status: Home / Self Care | Payer: Medicaid Other | Source: Ambulatory Visit | Attending: Cardiology | Admitting: Cardiology

## 2017-10-04 VITALS — BP 144/62 | HR 78 | Wt 271.4 lb

## 2017-10-04 DIAGNOSIS — G4733 Obstructive sleep apnea (adult) (pediatric): Secondary | ICD-10-CM | POA: Diagnosis not present

## 2017-10-04 DIAGNOSIS — I2781 Cor pulmonale (chronic): Secondary | ICD-10-CM

## 2017-10-04 DIAGNOSIS — I10 Essential (primary) hypertension: Secondary | ICD-10-CM | POA: Diagnosis not present

## 2017-10-04 DIAGNOSIS — I5032 Chronic diastolic (congestive) heart failure: Secondary | ICD-10-CM | POA: Diagnosis not present

## 2017-10-04 DIAGNOSIS — Z8249 Family history of ischemic heart disease and other diseases of the circulatory system: Secondary | ICD-10-CM | POA: Insufficient documentation

## 2017-10-04 DIAGNOSIS — Z87891 Personal history of nicotine dependence: Secondary | ICD-10-CM | POA: Insufficient documentation

## 2017-10-04 DIAGNOSIS — I110 Hypertensive heart disease with heart failure: Secondary | ICD-10-CM | POA: Insufficient documentation

## 2017-10-04 DIAGNOSIS — Z79899 Other long term (current) drug therapy: Secondary | ICD-10-CM | POA: Insufficient documentation

## 2017-10-04 DIAGNOSIS — Z9981 Dependence on supplemental oxygen: Secondary | ICD-10-CM | POA: Diagnosis not present

## 2017-10-04 LAB — BASIC METABOLIC PANEL
ANION GAP: 10 (ref 5–15)
BUN: 16 mg/dL (ref 6–20)
CHLORIDE: 85 mmol/L — AB (ref 98–111)
CO2: 42 mmol/L — ABNORMAL HIGH (ref 22–32)
Calcium: 9 mg/dL (ref 8.9–10.3)
Creatinine, Ser: 1.3 mg/dL — ABNORMAL HIGH (ref 0.61–1.24)
Glucose, Bld: 91 mg/dL (ref 70–99)
POTASSIUM: 4.5 mmol/L (ref 3.5–5.1)
Sodium: 137 mmol/L (ref 135–145)

## 2017-10-04 LAB — BRAIN NATRIURETIC PEPTIDE: B Natriuretic Peptide: 201.4 pg/mL — ABNORMAL HIGH (ref 0.0–100.0)

## 2017-10-04 NOTE — Progress Notes (Signed)
Patient ID: Douglas [name]., Douglas [name]   DOB: 04-14-1985, 32 y.o.   MRN: [id number]    Advanced Heart Failure Clinic Note   PCP: none.  Primary Cardiologist: Dr Aundra Dubin  Pulmonary: Dr Halford Chessman    HPI: Douglas [name] is a 32 yo with OHS/OSA, morbid obesity, HTN, diastolic CHF with prominent RV failure.   Admitted in August 2016  to [facility] with volume overload. Diuresed with lasix drip and diamox. Transitioned to lasix 80 mg twice a day. Discharge weight 257 pounds.   Admit to [facility] 3/21 through 04/28/15 with respiratory distress. Required short term intubation. Apparently had not been wearing BiPap. Discharge weight was 259 pounds.   Today he returns for HF follow up. Overall feeling fine. He continues to use oxygen at home. He has not been using  bipap because his machine was leaking an his mask did not work. Mild dyspnea with steps. Denies PND/Orthopnea. Appetite ok. No fever or chills. Weight at home 271 pounds. Taking all medications.   - ECHO 03/20/2014 EF 60-65% - ECHO 8/18: EF 55-60%, grade II diastolic dydsfunction, D-shaped septum suggesting RV pressure/volume overload, severe RV dilation with mild-moderately decreased RV systolic function, unable to estimate PA pressure.   Review of systems complete and found to be negative unless listed in HPI.   SH:  Social History   Socioeconomic History  . Marital status: Single    Spouse name: Not on file  . Number of children: 2  . Years of education: Not on file  . Highest education level: Not on file  Occupational History  . Occupation: N/A    Employer: NOT EMPLOYED  Social Needs  . Financial resource strain: Not on file  . Food insecurity:    Worry: Not on file    Inability: Not on file  . Transportation needs:    Medical: Not on file    Non-medical: Not on file  Tobacco Use  . Smoking status: Former Smoker    Packs/day: 1.50    Years: 3.00    Pack years: 4.50    Types: Cigarettes    Last attempt to quit: 10/31/2013    Years since  quitting: 3.9  . Smokeless tobacco: Never Used  Substance and Sexual Activity  . Alcohol use: No    Alcohol/week: 1.0 standard drinks    Types: 1 Cans of beer per week  . Drug use: No    Types: Cocaine    Comment: 10/31/2013 "might use cocaine once/month" - Has not used since 2015.   Marland Kitchen Sexual activity: Never  Lifestyle  . Physical activity:    Days per week: Not on file    Minutes per session: Not on file  . Stress: Not on file  Relationships  . Social connections:    Talks on phone: Not on file    Gets together: Not on file    Attends religious service: Not on file    Active member of club or organization: Not on file    Attends meetings of clubs or organizations: Not on file    Relationship status: Not on file  . Intimate partner violence:    Fear of current or ex partner: Not on file    Emotionally abused: Not on file    Physically abused: Not on file    Forced sexual activity: Not on file  Other Topics Concern  . Not on file  Social History Narrative  . Not on file    FH:  Family History  Problem Relation Age of Onset  . Heart disease Mother        Pacemaker  . Hypertension Father   . Cancer Paternal Grandfather   . Heart attack Neg Hx   . Stroke Neg Hx     Past Medical History:  Diagnosis Date  . Arthritis    "hands, feet" (10/31/2013)  . CHF (congestive heart failure) ([place])   . Chronic bronchitis ([place])   . High cholesterol   . Hypertension   . Obesity   . OSA on CPAP   . Pneumonia 09/2011  . Substance abuse ([place]) 8/31/213   cociane "first time use"  . Tobacco abuse     Current Outpatient Medications  Medication Sig Dispense Refill  . albuterol (PROVENTIL) 2 MG tablet Take 2 mg by mouth 3 (three) times daily.    . carvedilol (COREG) 6.25 MG tablet TAKE 1 AND 1/2 TABLET BY MOUTH TWICE A DAY 90 tablet 5  . hydrALAZINE (APRESOLINE) 25 MG tablet Take 0.5 tablets (12.5 mg total) by mouth 3 (three) times daily. 135 tablet 1  . losartan (COZAAR) 50 MG  tablet TAKE ONE TABLET BY MOUTH TWICE DAILY 60 tablet 3  . NON FORMULARY 2L of continuous oxygen    . torsemide (DEMADEX) 20 MG tablet TAKE 3 (THREE) TABLETS BY MOUTH TWICE DAILY 180 tablet 3  . HYDROcodone-acetaminophen (NORCO) 5-325 MG tablet Take 1 tablet by mouth every 6 (six) hours as needed for moderate pain. (Patient not taking: Reported on 09/14/2017) 12 tablet 0   No current facility-administered medications for this encounter.     Vitals:   10/04/17 1012  BP: (!) 144/62  Pulse: 78  SpO2: 94%  Weight: 123.1 kg (271 lb 6.4 oz)   Wt Readings from Last 3 Encounters:  10/04/17 123.1 kg (271 lb 6.4 oz)  08/18/17 123 kg (271 lb 3.2 oz)  08/11/17 124.7 kg (275 lb)     PHYSICAL EXAM: General:  Well appearing. No resp difficulty HEENT: normal Neck: supple. no JVD. Carotids 2+ bilat; no bruits. No lymphadenopathy or thryomegaly appreciated. Cor: PMI nondisplaced. Regular rate & rhythm. No rubs, gallops or murmurs. Lungs: clear on 2 liters oxygen.  Abdomen: soft, nontender, nondistended. No hepatosplenomegaly. No bruits or masses. Good bowel sounds. Extremities: no cyanosis, clubbing, rash, edema Neuro: alert & orientedx3, cranial nerves grossly intact. moves all 4 extremities w/o difficulty. Affect pleasant   ASSESSMENT & PLAN:  1.Chronic diastolic CHF with prominent RV failure: Echo 08/30/16 with D shaped septum and severely enlarged RV. RV failure in the setting of OSA and OHS.  - NYHA II-III. -Volume status stable. Continue torsemide 60 mg twice a day.  - Continue losartan 50 mg BID - Continue Coreg 9.375 mg BID - No Spiro with history of hyperkalemia.   2. OHS/OSA:  - He has not been using CPAP because there is a leak and the mask doesn't fit.  -I personally called [facility]. We will send him to [facility] to see if we can fix the CPAP.   3. HTN:   Stable.   - 4. RV failure:  - VQ scan without PE.  - Continue 2L O2 - Repeat ECHO next visit.   Check BMET and BNP today.    Follow up in 3 months with Dr Aundra Dubin and ECHO.  Discussed HF Paramedicine.   Darrick Grinder, NP 10/04/2017

## 2017-10-04 NOTE — Patient Instructions (Signed)
Labs today (will call for abnormal results, otherwise no news is good news)  Echocardiogram and follow up in 3 months.

## 2017-10-06 ENCOUNTER — Other Ambulatory Visit (HOSPITAL_COMMUNITY): Payer: Self-pay

## 2017-10-06 ENCOUNTER — Telehealth (HOSPITAL_COMMUNITY): Payer: Self-pay

## 2017-10-06 ENCOUNTER — Telehealth: Payer: Self-pay | Admitting: Pulmonary Disease

## 2017-10-06 DIAGNOSIS — G4733 Obstructive sleep apnea (adult) (pediatric): Secondary | ICD-10-CM

## 2017-10-06 NOTE — Telephone Encounter (Signed)
Contacted [name]. In regards to home visit today. Patient agreed upon afternoon visit to evaluate his CPAP.

## 2017-10-06 NOTE — Telephone Encounter (Signed)
Spoke with Thedore Mins at the CHF clinic  He reports that at pt's last visit there, he mentioned that his BIPAP stopped working  I called the pt he states it has been not working for approx 6-8 wks  I have sent urgent req to [facility] to get [facility] to service his machine  Pt to call back if has not heard from someone soon

## 2017-10-06 NOTE — Progress Notes (Signed)
Paramedicine Encounter    Patient ID: Douglas [name]., male    DOB: 04/01/1985, 32 y.o.   MRN: [id number]   Patient Care Team: Medicine, [facility] as PCP - General  Patient Active Problem List   Diagnosis Date Noted  . Morbid obesity ([place]) 06/17/2015  . Essential hypertension 04/14/2015  . Hyperkalemia 04/14/2015  . OSA (obstructive sleep apnea)   . Difficult intravenous access   . Chronic diastolic CHF (congestive heart failure), NYHA class 3 ([place]) 09/13/2014  . Cor pulmonale ([place]) 03/19/2014  . OSA treated with BiPAP     Current Outpatient Medications:  .  albuterol (PROVENTIL) 2 MG tablet, Take 2 mg by mouth 3 (three) times daily., Disp: , Rfl:  .  carvedilol (COREG) 6.25 MG tablet, TAKE 1 AND 1/2 TABLET BY MOUTH TWICE A DAY, Disp: 90 tablet, Rfl: 5 .  hydrALAZINE (APRESOLINE) 25 MG tablet, Take 0.5 tablets (12.5 mg total) by mouth 3 (three) times daily., Disp: 135 tablet, Rfl: 1 .  HYDROcodone-acetaminophen (NORCO) 5-325 MG tablet, Take 1 tablet by mouth every 6 (six) hours as needed for moderate pain. (Patient not taking: Reported on 09/14/2017), Disp: 12 tablet, Rfl: 0 .  losartan (COZAAR) 50 MG tablet, TAKE ONE TABLET BY MOUTH TWICE DAILY, Disp: 60 tablet, Rfl: 3 .  NON FORMULARY, 2L of continuous oxygen, Disp: , Rfl:  .  torsemide (DEMADEX) 20 MG tablet, TAKE 3 (THREE) TABLETS BY MOUTH TWICE DAILY, Disp: 180 tablet, Rfl: 3 Allergies  Allergen Reactions  . Lisinopril Cough      Social History   Socioeconomic History  . Marital status: Single    Spouse name: Not on file  . Number of children: 2  . Years of education: Not on file  . Highest education level: Not on file  Occupational History  . Occupation: N/A    Employer: NOT EMPLOYED  Social Needs  . Financial resource strain: Not on file  . Food insecurity:    Worry: Not on file    Inability: Not on file  . Transportation needs:    Medical: Not on file    Non-medical: Not on file   Tobacco Use  . Smoking status: Former Smoker    Packs/day: 1.50    Years: 3.00    Pack years: 4.50    Types: Cigarettes    Last attempt to quit: 10/31/2013    Years since quitting: 3.9  . Smokeless tobacco: Never Used  Substance and Sexual Activity  . Alcohol use: No    Alcohol/week: 1.0 standard drinks    Types: 1 Cans of beer per week  . Drug use: No    Types: Cocaine    Comment: 10/31/2013 "might use cocaine once/month" - Has not used since 2015.   Marland Kitchen Sexual activity: Never  Lifestyle  . Physical activity:    Days per week: Not on file    Minutes per session: Not on file  . Stress: Not on file  Relationships  . Social connections:    Talks on phone: Not on file    Gets together: Not on file    Attends religious service: Not on file    Active member of club or organization: Not on file    Attends meetings of clubs or organizations: Not on file    Relationship status: Not on file  . Intimate partner violence:    Fear of current or ex partner: Not on file    Emotionally abused: Not on file  Physically abused: Not on file    Forced sexual activity: Not on file  Other Topics Concern  . Not on file  Social History Narrative  . Not on file    Physical Exam  Constitutional: He is oriented to person, place, and time.  Cardiovascular: Normal rate and regular rhythm.  Pulmonary/Chest: Effort normal and breath sounds normal.  Abdominal: Soft.  Musculoskeletal: Normal range of motion. He exhibits no edema.  Neurological: He is alert and oriented to person, place, and time.  Skin: Skin is warm and dry.  Psychiatric: He has a normal mood and affect.        Future Appointments  Date Time Provider [place]  01/05/2018  1:00 PM [place] ECHO 1-BUZZ [facility]  01/05/2018  2:00 PM Larey Dresser, MD [facility] None    BP 130/84 (BP Location: Left Arm, Patient Position: Sitting, Cuff Size: Normal)   Pulse 86   Resp 14   Ht 5' 4.8" (1.646 m)   Wt 275 lb 8 oz  (125 kg)   SpO2 93%   BMI 46.13 kg/m   Weight yesterday-did not weigh Last visit weight- 271 lb  Mr Lichtenwalner was seen at home today and reported feeling well. He denied SOB, headache, dizziness or orthopnea. He has been compliant with his medications however his weight has increased over the past week. His medications were verified however he is no longer using a pillbox. His BiPAP is currently not working so I contacted his pulmonologist to have them submit a new Rx to [place]. Additionally I gave him information on getting signed up for his new Medicaid plan. He said his sister would help him with the process. He is planning to move out of his apartment by November as he has secured a housing voucher for [phone]. He has asked for help finding new housing that is "in a better area."   [name], EMT 10/06/17  ACTION: Home visit completed Next visit planned for 1 week

## 2017-10-19 ENCOUNTER — Other Ambulatory Visit (HOSPITAL_COMMUNITY): Payer: Self-pay | Admitting: Cardiology

## 2017-10-19 ENCOUNTER — Telehealth (HOSPITAL_COMMUNITY): Payer: Self-pay

## 2017-10-19 DIAGNOSIS — I5032 Chronic diastolic (congestive) heart failure: Secondary | ICD-10-CM

## 2017-10-19 NOTE — Telephone Encounter (Signed)
I called Mr Nix to schedule an appointment. He did not answer so I left a message requesting he call me back.  

## 2017-10-27 ENCOUNTER — Telehealth (HOSPITAL_COMMUNITY): Payer: Self-pay

## 2017-10-27 NOTE — Telephone Encounter (Signed)
I called Douglas [name] to schedule an appointment for next week. He stated he would be available on Tuesday and asked that I come by at 13:30 or 14:00. He was also reminded about the Men's Group which will be starting next week and he said he was interested in attending. I will follow up on this again Tuesday to see if he has transportation.

## 2017-10-31 ENCOUNTER — Telehealth (HOSPITAL_COMMUNITY): Payer: Self-pay | Admitting: Licensed Clinical Social Worker

## 2017-10-31 ENCOUNTER — Other Ambulatory Visit (HOSPITAL_COMMUNITY): Payer: Self-pay

## 2017-10-31 NOTE — Progress Notes (Signed)
Paramedicine Encounter    Patient ID: Douglas [name]., male    DOB: [birth year], 32 y.o.   MRN: [id number]   Patient Care Team: Medicine, [facility] as PCP - General  Patient Active Problem List   Diagnosis Date Noted  . Morbid obesity ([place]) 06/17/2015  . Essential hypertension 04/14/2015  . Hyperkalemia 04/14/2015  . OSA (obstructive sleep apnea)   . Difficult intravenous access   . Chronic diastolic CHF (congestive heart failure), NYHA class 3 ([place]) 09/13/2014  . Cor pulmonale ([place]) 03/19/2014  . OSA treated with BiPAP     Current Outpatient Medications:  .  albuterol (PROVENTIL) 2 MG tablet, Take 2 mg by mouth 3 (three) times daily., Disp: , Rfl:  .  carvedilol (COREG) 6.25 MG tablet, TAKE 1 AND 1/2 TABLET BY MOUTH TWICE A DAY, Disp: 90 tablet, Rfl: 5 .  hydrALAZINE (APRESOLINE) 25 MG tablet, Take 0.5 tablets (12.5 mg total) by mouth 3 (three) times daily., Disp: 135 tablet, Rfl: 1 .  losartan (COZAAR) 50 MG tablet, TAKE ONE TABLET BY MOUTH TWICE DAILY, Disp: 60 tablet, Rfl: 11 .  NON FORMULARY, 2L of continuous oxygen, Disp: , Rfl:  .  torsemide (DEMADEX) 20 MG tablet, TAKE 3 (THREE) TABLETS BY MOUTH TWICE DAILY, Disp: 180 tablet, Rfl: 3 .  HYDROcodone-acetaminophen (NORCO) 5-325 MG tablet, Take 1 tablet by mouth every 6 (six) hours as needed for moderate pain. (Patient not taking: Reported on 09/14/2017), Disp: 12 tablet, Rfl: 0 Allergies  Allergen Reactions  . Lisinopril Cough      Social History   Socioeconomic History  . Marital status: Single    Spouse name: Not on file  . Number of children: 2  . Years of education: Not on file  . Highest education level: Not on file  Occupational History  . Occupation: N/A    Employer: NOT EMPLOYED  Social Needs  . Financial resource strain: Not on file  . Food insecurity:    Worry: Not on file    Inability: Not on file  . Transportation needs:    Medical: Not on file    Non-medical: Not on file   Tobacco Use  . Smoking status: Former Smoker    Packs/day: 1.50    Years: 3.00    Pack years: 4.50    Types: Cigarettes    Last attempt to quit: 10/31/2013    Years since quitting: 4.0  . Smokeless tobacco: Never Used  Substance and Sexual Activity  . Alcohol use: No    Alcohol/week: 1.0 standard drinks    Types: 1 Cans of beer per week  . Drug use: No    Types: Cocaine    Comment: 10/31/2013 "might use cocaine once/month" - Has not used since 2015.   Marland Kitchen Sexual activity: Never  Lifestyle  . Physical activity:    Days per week: Not on file    Minutes per session: Not on file  . Stress: Not on file  Relationships  . Social connections:    Talks on phone: Not on file    Gets together: Not on file    Attends religious service: Not on file    Active member of club or organization: Not on file    Attends meetings of clubs or organizations: Not on file    Relationship status: Not on file  . Intimate partner violence:    Fear of current or ex partner: Not on file    Emotionally abused: Not on file  Physically abused: Not on file    Forced sexual activity: Not on file  Other Topics Concern  . Not on file  Social History Narrative  . Not on file    Physical Exam  Constitutional: He is oriented to person, place, and time.  Cardiovascular: Normal rate and regular rhythm.  Pulmonary/Chest: Effort normal and breath sounds normal.  Abdominal: Soft.  Musculoskeletal: Normal range of motion. He exhibits no edema.  Neurological: He is alert and oriented to person, place, and time.  Skin: Skin is warm and dry.  Psychiatric: He has a normal mood and affect.        Future Appointments  Date Time Provider [place]  01/05/2018  1:00 PM [place] ECHO 1-BUZZ [facility]  01/05/2018  2:00 PM Larey Dresser, MD [facility] None    BP 108/76 (BP Location: Left Arm, Patient Position: Sitting, Cuff Size: Large)   Pulse 76   Resp 16   Wt 273 lb (123.8 kg)   SpO2 98%   BMI  45.71 kg/m   Weight yesterday- 272 lb Last visit weight- 275 lb  [name] was seen at home today and reported feeling well, physically. He admitted to feelings of depression due to his inability to find work and the stress of finding a new apartment is weighing on him. I asked if he planned on coming to the Men's Support group tomorrow and he said he did not think he could because he was going to look at apartments. We spent a lot of time talking about the possible benefits of coming to the group and he eventually agreed. I called his sister who is his primary mode of transportation and made her aware of the group and she stated she would bring him. His medications were verified and his pillbox was checked, as he had filled it prior to my arrival. He had filled it correctly with the exception of missing the morning done of carvedilol. I pointed this out and reminded him that he takes that medications twice daily. I also spoke with [name] Sours, LCSW, and made her aware of his housing concerns and she advised that she would be available to speak with him tomorrow either before of after the meeting. I let Mr Beale know this and he was agreeable.   Douglas [name], EMT 10/31/17  ACTION: Home visit completed Next visit planned for 1 week

## 2017-10-31 NOTE — Telephone Encounter (Signed)
CSW informed by paramedic, [name], that patient interested in getting help moving to new apartment.  Patient has just been awarded Section 8 voucher and is not sure how to go about getting into appropriate housing.  CSW assisting in calling apartment sites to inquire about availability and pricing and will follow up with Douglas [name] tomorrow in clinic when he comes to support group.  CSW will continue to follow and assist as needed  Jorge Ny, [place] Social Worker [phone]

## 2017-11-01 ENCOUNTER — Encounter (HOSPITAL_COMMUNITY): Payer: Self-pay | Admitting: Licensed Clinical Social Worker

## 2017-11-01 NOTE — Progress Notes (Signed)
CSW met with patient in clinic following patient attending men's support group.  CSW provided pt with list of Section 8 apartments and informed of two sites that had current availability- pt plans to visit Coley Jenkins this afternoon  CSW will continue to follow and assist as needed   Jenna H. Uris, LCSW Clinical Social Worker [phone]    

## 2017-11-02 ENCOUNTER — Telehealth (HOSPITAL_COMMUNITY): Payer: Self-pay

## 2017-11-02 NOTE — Telephone Encounter (Signed)
Mr Patalano called me to ask about the new Medicaid enrollment. I began explaining this to him and he stopped me and asked that I explain it to his sister since she helps him. I obliged and advised I will bring out another set of enrollment instructions.

## 2017-11-03 ENCOUNTER — Other Ambulatory Visit (HOSPITAL_COMMUNITY): Payer: Self-pay

## 2017-11-03 NOTE — Progress Notes (Signed)
I went to see Douglas [name]'s sister today to drop off the paperwork for medicaid. While I was there, she stated she had received a letter from [facility] saying he had been denied his claim for home oxygen due to it not being a medical necessity. I spoke to the clinic about this and they suggested he speak to his pulmonologist. He stated he understood and I gave the same information to his sister who helps him handle most of his affairs. I asked that he keep me in the loop regarding the progress he makes. He was understanding and agreeable.

## 2017-11-13 ENCOUNTER — Ambulatory Visit (HOSPITAL_COMMUNITY)
Admission: EM | Admit: 2017-11-13 | Discharge: 2017-11-13 | Disposition: A | Discharge status: Home / Self Care | Payer: Medicaid Other | Attending: Family Medicine | Admitting: Family Medicine

## 2017-11-13 ENCOUNTER — Encounter (HOSPITAL_COMMUNITY): Payer: Self-pay | Admitting: Emergency Medicine

## 2017-11-13 DIAGNOSIS — M1A4720 Other secondary chronic gout, left ankle and foot, without tophus (tophi): Secondary | ICD-10-CM

## 2017-11-13 MED ORDER — INDOMETHACIN 50 MG PO CAPS: 50.0000 mg | ORAL_CAPSULE | Freq: Three times a day (TID) | ORAL | 0 refills | Status: DC

## 2017-11-13 NOTE — ED Provider Notes (Signed)
[place]    CSN: [id number] Arrival date & time: 11/13/17  8119     History   Chief Complaint Chief Complaint  Patient presents with  . Foot Pain    HPI Douglas [name]. is a 32 y.o. male.   HPI  Patient has known gout.  Three flares this year.  Is on diuretics for chronic CHF.  Today has pain in the left foot and ankle that feels typical for gout.  Is not on prevention.   No trauma or fall No alcohol or change in diet Compliant with medicines On chronic O2   Past Medical History:  Diagnosis Date  . Arthritis    "hands, feet" (10/31/2013)  . CHF (congestive heart failure) ([place])   . Chronic bronchitis ([place])   . High cholesterol   . Hypertension   . Obesity   . OSA on CPAP   . Pneumonia 09/2011  . Substance abuse ([place]) 8/31/213   cociane "first time use"  . Tobacco abuse     Patient Active Problem List   Diagnosis Date Noted  . Morbid obesity ([place]) 06/17/2015  . Essential hypertension 04/14/2015  . Hyperkalemia 04/14/2015  . OSA (obstructive sleep apnea)   . Difficult intravenous access   . Chronic diastolic CHF (congestive heart failure), NYHA class 3 ([place]) 09/13/2014  . Cor pulmonale ([place]) 03/19/2014  . OSA treated with BiPAP     Past Surgical History:  Procedure Laterality Date  . NO PAST SURGERIES    . TUBES IN EARS         Home Medications    Prior to Admission medications   Medication Sig Start Date End Date Taking? Authorizing Provider  albuterol (PROVENTIL) 2 MG tablet Take 2 mg by mouth 3 (three) times daily.    [provider]  carvedilol (COREG) 6.25 MG tablet TAKE 1 AND 1/2 TABLET BY MOUTH TWICE A DAY 08/17/17   Larey Dresser, MD  hydrALAZINE (APRESOLINE) 25 MG tablet Take 0.5 tablets (12.5 mg total) by mouth 3 (three) times daily. 09/12/17 12/11/17  Clegg, [name] D, NP  indomethacin (INDOCIN) 50 MG capsule Take 1 capsule (50 mg total) by mouth 3 (three) times daily with meals. 11/13/17   Raylene Everts, MD   losartan (COZAAR) 50 MG tablet TAKE ONE TABLET BY MOUTH TWICE DAILY 10/20/17   Larey Dresser, MD  NON FORMULARY 2L of continuous oxygen    [provider]  torsemide (DEMADEX) 20 MG tablet TAKE 3 (THREE) TABLETS BY MOUTH TWICE DAILY 05/22/17   Larey Dresser, MD    Family History Family History  Problem Relation Age of Onset  . Heart disease Mother        Pacemaker  . Hypertension Father   . Cancer Paternal Grandfather   . Heart attack Neg Hx   . Stroke Neg Hx     Social History Social History   Tobacco Use  . Smoking status: Former Smoker    Packs/day: 1.50    Years: 3.00    Pack years: 4.50    Types: Cigarettes    Last attempt to quit: 10/31/2013    Years since quitting: 4.0  . Smokeless tobacco: Never Used  Substance Use Topics  . Alcohol use: No    Alcohol/week: 1.0 standard drinks    Types: 1 Cans of beer per week  . Drug use: No    Types: Cocaine    Comment: 10/31/2013 "might use cocaine once/month" - Has not  used since 2015.      Allergies   Lisinopril   Review of Systems Review of Systems  Constitutional: Negative for chills and fever.  HENT: Positive for dental problem. Negative for ear pain and sore throat.   Eyes: Negative for pain and visual disturbance.  Respiratory: Positive for shortness of breath. Negative for cough.   Cardiovascular: Negative for chest pain and palpitations.  Gastrointestinal: Negative for abdominal pain and vomiting.  Genitourinary: Negative for dysuria and hematuria.  Musculoskeletal: Positive for arthralgias and gait problem. Negative for back pain.  Skin: Negative for color change and rash.  Neurological: Negative for seizures and syncope.  All other systems reviewed and are negative.    Physical Exam Triage Vital Signs ED Triage Vitals  Enc Vitals Group     BP 11/13/17 1013 (!) 141/93     Pulse Rate 11/13/17 1013 86     Resp 11/13/17 1013 18     Temp 11/13/17 1013 98.9 F (37.2 C)     Temp src --       SpO2 11/13/17 1013 97 %     Weight --      Height --      Head Circumference --      Peak Flow --      Pain Score 11/13/17 1014 9     Pain Loc --      Pain Edu? --      Excl. in [place]? --    No data found.  Updated Vital Signs BP (!) 141/93   Pulse 86   Temp 98.9 F (37.2 C)   Resp 18   SpO2 97%      Physical Exam  Constitutional: He appears well-developed and well-nourished. He appears distressed.  Appears uncomfortable.  Obese.  In wheelchair.  On oxygen  HENT:  Head: Normocephalic and atraumatic.  Mouth/Throat: Oropharynx is clear and moist.  Eyes: Pupils are equal, round, and reactive to light. Conjunctivae are normal.  Neck: Normal range of motion.  Cardiovascular: Normal rate, regular rhythm and normal heart sounds.  Pulmonary/Chest: Effort normal and breath sounds normal. No respiratory distress. He has no wheezes.  Abdominal: Soft. He exhibits no distension.  Musculoskeletal: Normal range of motion. He exhibits no edema.  General tenderness about the left ankle , pain with movement.  No redness - palpable warmth  Neurological: He is alert.  Skin: Skin is warm and dry.     UC Treatments / Results  Labs (all labs ordered are listed, but only abnormal results are displayed) Labs Reviewed - No data to display  EKG None  Radiology No results found.  Procedures Procedures (including critical care time)  Medications Ordered in UC Medications - No data to display  Initial Impression / Assessment and Plan / UC Course  I have reviewed the triage vital signs and the nursing notes.  Pertinent labs & imaging results that were available during my care of the patient were reviewed by me and considered in my medical decision making (see chart for details).     discussed gout treatment.  Will give indomethacin.  Patient requested a "shot" of prednisone but discussed steroids could worsen heart failure.  Recommended a visit to PCP to discuss allopurinol for  prevention. 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 Provider   indomethacin (INDOCIN) 50 MG capsule Take 1 capsule (50 mg total) by mouth 3 (three) times daily with meals. 15 capsule Raylene Everts, MD     Controlled Substance Prescriptions [place] Controlled Substance Registry consulted? Not Applicable   Raylene Everts, MD 11/13/17 1045

## 2017-11-13 NOTE — Discharge Instructions (Signed)
Take the indomethacin for pain TAKE WITH FOOD Talk to your PCP about prevention

## 2017-11-13 NOTE — ED Triage Notes (Signed)
Pt c/o gout in his L foot, pain for 3 days. Pt has post op boot on his L foot.

## 2017-11-15 ENCOUNTER — Telehealth (HOSPITAL_COMMUNITY): Payer: Self-pay

## 2017-11-15 NOTE — Telephone Encounter (Signed)
I called Douglas [name] to reschedule our appointment. He stated he would be able to meet on Friday at 15:00 so we agreed to meet then.

## 2017-11-15 NOTE — Telephone Encounter (Signed)
I called Douglas [name] to schedule an appointment.He stated he would be available tomorrow at 10:00. We agreed to meet then.

## 2017-11-17 ENCOUNTER — Other Ambulatory Visit (HOSPITAL_COMMUNITY): Payer: Self-pay

## 2017-11-17 NOTE — Progress Notes (Signed)
Paramedicine Encounter    Patient ID: Douglas Madura., male    DOB: [birth year], 32 y.o.   MRN: [id number]   Patient Care Team: Medicine, [facility] as PCP - General  Patient Active Problem List   Diagnosis Date Noted  . Morbid obesity ([place]) 06/17/2015  . Essential hypertension 04/14/2015  . Hyperkalemia 04/14/2015  . OSA (obstructive sleep apnea)   . Difficult intravenous access   . Chronic diastolic CHF (congestive heart failure), NYHA class 3 ([place]) 09/13/2014  . Cor pulmonale ([place]) 03/19/2014  . OSA treated with BiPAP     Current Outpatient Medications:  .  albuterol (PROVENTIL) 2 MG tablet, Take 2 mg by mouth 3 (three) times daily., Disp: , Rfl:  .  carvedilol (COREG) 6.25 MG tablet, TAKE 1 AND 1/2 TABLET BY MOUTH TWICE A DAY, Disp: 90 tablet, Rfl: 5 .  hydrALAZINE (APRESOLINE) 25 MG tablet, Take 0.5 tablets (12.5 mg total) by mouth 3 (three) times daily., Disp: 135 tablet, Rfl: 1 .  indomethacin (INDOCIN) 50 MG capsule, Take 1 capsule (50 mg total) by mouth 3 (three) times daily with meals., Disp: 15 capsule, Rfl: 0 .  losartan (COZAAR) 50 MG tablet, TAKE ONE TABLET BY MOUTH TWICE DAILY, Disp: 60 tablet, Rfl: 11 .  NON FORMULARY, 2L of continuous oxygen, Disp: , Rfl:  .  torsemide (DEMADEX) 20 MG tablet, TAKE 3 (THREE) TABLETS BY MOUTH TWICE DAILY, Disp: 180 tablet, Rfl: 3 Allergies  Allergen Reactions  . Lisinopril Cough      Social History   Socioeconomic History  . Marital status: Single    Spouse name: Not on file  . Number of children: 2  . Years of education: Not on file  . Highest education level: Not on file  Occupational History  . Occupation: N/A    Employer: NOT EMPLOYED  Social Needs  . Financial resource strain: Not on file  . Food insecurity:    Worry: Not on file    Inability: Not on file  . Transportation needs:    Medical: Not on file    Non-medical: Not on file  Tobacco Use  . Smoking status: Former Smoker   Packs/day: 1.50    Years: 3.00    Pack years: 4.50    Types: Cigarettes    Last attempt to quit: 10/31/2013    Years since quitting: 4.0  . Smokeless tobacco: Never Used  Substance and Sexual Activity  . Alcohol use: No    Alcohol/week: 1.0 standard drinks    Types: 1 Cans of beer per week  . Drug use: No    Types: Cocaine    Comment: 10/31/2013 "might use cocaine once/month" - Has not used since 2015.   Marland Kitchen Sexual activity: Never  Lifestyle  . Physical activity:    Days per week: Not on file    Minutes per session: Not on file  . Stress: Not on file  Relationships  . Social connections:    Talks on phone: Not on file    Gets together: Not on file    Attends religious service: Not on file    Active member of club or organization: Not on file    Attends meetings of clubs or organizations: Not on file    Relationship status: Not on file  . Intimate partner violence:    Fear of current or ex partner: Not on file    Emotionally abused: Not on file    Physically abused: Not on file  Forced sexual activity: Not on file  Other Topics Concern  . Not on file  Social History Narrative  . Not on file    Physical Exam  Constitutional: He is oriented to person, place, and time.  Cardiovascular: Normal rate and regular rhythm.  Pulmonary/Chest: Effort normal and breath sounds normal.  Abdominal: Soft. Bowel sounds are normal.  Musculoskeletal: Normal range of motion. He exhibits no edema.  Neurological: He is alert and oriented to person, place, and time.  Skin: Skin is warm and dry.  Psychiatric: He has a normal mood and affect.        Future Appointments  Date Time Provider [place]  01/05/2018  1:00 PM [place] ECHO 1-BUZZ [facility]  01/05/2018  2:00 PM Larey Dresser, MD [facility] None    BP 130/88 (BP Location: Left Arm, Patient Position: Sitting, Cuff Size: Large)   Pulse 70   Resp 18   Wt 274 lb (124.3 kg)   SpO2 93%   BMI 45.88 kg/m   Weight  yesterday- did not weigh Last visit weight- 273 lb  Douglas [name] was seen at home today and reported feeling well. He denied SOB, headache, dizziness or orthopnea. He seemed melancholy so I asked how he was feeling beyond the physical. He told me then that his apartment application had been denied due to an outstanding balance at a previous residence or $1,000. He stated that he would plan on staying in his current apartment so he could find a job and save money. He had a new CPAP system delivered and asked for help in setting it up. We spent about 20 minutes going over the machine and conducting fit tests with different nose pieces. I was also able to get a food stamp application from [place] and told him to have his sister fill it out with him over the weekend and I would come get it and turn it in next week. His medications were verified and no changes were made.   [name], EMT 11/17/17  ACTION: Home visit completed Next visit planned for 1 week

## 2017-11-21 ENCOUNTER — Other Ambulatory Visit (HOSPITAL_COMMUNITY): Payer: Self-pay | Admitting: Cardiology

## 2017-11-21 ENCOUNTER — Telehealth (HOSPITAL_COMMUNITY): Payer: Self-pay

## 2017-11-21 NOTE — Telephone Encounter (Signed)
I called Douglas [name] in response to a text he sent me yesterday. In the text he advised he had a question regarding his new CPAP machine. He did not answer my call so I left a message requesting he call me back when he is able.

## 2017-12-01 ENCOUNTER — Telehealth (HOSPITAL_COMMUNITY): Payer: Self-pay

## 2017-12-01 NOTE — Telephone Encounter (Signed)
I called Douglas [name] to schedule an appointment. His phone went straight to voicemail however no message was left due to the time of day and I would be leaving the office soon. I will reach out at the beginning of next week.

## 2017-12-07 ENCOUNTER — Telehealth (HOSPITAL_COMMUNITY): Payer: Self-pay

## 2017-12-07 NOTE — Telephone Encounter (Signed)
I called Mr Gintz to schedule an appointment. His phone went directly to voicemail without ringing so I left a message requesting he call me back.

## 2017-12-14 ENCOUNTER — Telehealth (HOSPITAL_COMMUNITY): Payer: Self-pay

## 2017-12-14 NOTE — Telephone Encounter (Signed)
I called Douglas [name] to schedule an appointment. He stated he would be available tomorrow morning so we agreed to meet at 09:30.

## 2017-12-15 ENCOUNTER — Telehealth (HOSPITAL_COMMUNITY): Payer: Self-pay

## 2017-12-15 ENCOUNTER — Other Ambulatory Visit (HOSPITAL_COMMUNITY): Payer: Self-pay

## 2017-12-15 NOTE — Progress Notes (Signed)
Paramedicine Encounter    Patient ID: Douglas [name]., male    DOB: 05-May-1985, 32 y.o.   MRN: [id number]   Patient Care Team: Medicine, [facility] as PCP - General  Patient Active Problem List   Diagnosis Date Noted  . Morbid obesity ([place]) 06/17/2015  . Essential hypertension 04/14/2015  . Hyperkalemia 04/14/2015  . OSA (obstructive sleep apnea)   . Difficult intravenous access   . Chronic diastolic CHF (congestive heart failure), NYHA class 3 ([place]) 09/13/2014  . Cor pulmonale ([place]) 03/19/2014  . OSA treated with BiPAP     Current Outpatient Medications:  .  carvedilol (COREG) 6.25 MG tablet, TAKE 1 AND 1/2 TABLET BY MOUTH TWICE A DAY, Disp: 90 tablet, Rfl: 5 .  hydrALAZINE (APRESOLINE) 25 MG tablet, Take 0.5 tablets (12.5 mg total) by mouth 3 (three) times daily., Disp: 135 tablet, Rfl: 1 .  indomethacin (INDOCIN) 50 MG capsule, Take 1 capsule (50 mg total) by mouth 3 (three) times daily with meals., Disp: 15 capsule, Rfl: 0 .  losartan (COZAAR) 50 MG tablet, TAKE ONE TABLET BY MOUTH TWICE DAILY, Disp: 60 tablet, Rfl: 11 .  NON FORMULARY, 2L of continuous oxygen, Disp: , Rfl:  .  torsemide (DEMADEX) 20 MG tablet, TAKE 3 (THREE) TABLETS BY MOUTH TWICE DAILY, Disp: 180 tablet, Rfl: 3 .  albuterol (PROVENTIL) 2 MG tablet, Take 2 mg by mouth 3 (three) times daily., Disp: , Rfl:  Allergies  Allergen Reactions  . Lisinopril Cough      Social History   Socioeconomic History  . Marital status: Single    Spouse name: Not on file  . Number of children: 2  . Years of education: Not on file  . Highest education level: Not on file  Occupational History  . Occupation: N/A    Employer: NOT EMPLOYED  Social Needs  . Financial resource strain: Not on file  . Food insecurity:    Worry: Not on file    Inability: Not on file  . Transportation needs:    Medical: Not on file    Non-medical: Not on file  Tobacco Use  . Smoking status: Former Smoker     Packs/day: 1.50    Years: 3.00    Pack years: 4.50    Types: Cigarettes    Last attempt to quit: 10/31/2013    Years since quitting: 4.1  . Smokeless tobacco: Never Used  Substance and Sexual Activity  . Alcohol use: No    Alcohol/week: 1.0 standard drinks    Types: 1 Cans of beer per week  . Drug use: No    Types: Cocaine    Comment: 10/31/2013 "might use cocaine once/month" - Has not used since 2015.   Marland Kitchen Sexual activity: Never  Lifestyle  . Physical activity:    Days per week: Not on file    Minutes per session: Not on file  . Stress: Not on file  Relationships  . Social connections:    Talks on phone: Not on file    Gets together: Not on file    Attends religious service: Not on file    Active member of club or organization: Not on file    Attends meetings of clubs or organizations: Not on file    Relationship status: Not on file  . Intimate partner violence:    Fear of current or ex partner: Not on file    Emotionally abused: Not on file    Physically abused: Not  on file    Forced sexual activity: Not on file  Other Topics Concern  . Not on file  Social History Narrative  . Not on file    Physical Exam  Constitutional: He is oriented to person, place, and time.  Cardiovascular: Normal rate and regular rhythm.  Pulmonary/Chest: Effort normal and breath sounds normal.  Abdominal: Soft.  Musculoskeletal: Normal range of motion. He exhibits no edema.  Neurological: He is alert and oriented to person, place, and time.  Skin: Skin is warm and dry.  Psychiatric: He has a normal mood and affect.        Future Appointments  Date Time Provider [place]  01/05/2018  1:00 PM [place] ECHO 1-BUZZ [facility]  01/05/2018  2:00 PM Larey Dresser, MD [facility] None    BP 110/78 (BP Location: Left Arm, Patient Position: Sitting, Cuff Size: Large)   Pulse 80   Resp 18   Wt 279 lb 1.6 oz (126.6 kg)   SpO2 92%   BMI 46.73 kg/m   Weight yesterday- Did not  weigh Last visit weight- 274 lb  Douglas [name] was seen at home today and reported feeling well. He denied chest pain, SOB, headache, dizziness or orthopnea. He reported being compliant with his medications since I saw him last and requested a new pillbox because the one he had been using was not longer usable. I provided a new box, verified his medications and refilled said box. I gathered information for Christmas gifts for him and his children and relayed them the [name] Sours, LCSW. We spoke about discharge from paramedicine however he was hesitant so we will continue with monthly visits and readdress at the beginning of next year.   Douglas [name], EMT 12/15/17  ACTION: Home visit completed Next visit planned for 3 weeks

## 2017-12-15 NOTE — Telephone Encounter (Signed)
I called Douglas [name] to make sure he was available for me to come by this afternoon after missing our morning appointment. He stated he was home and was sorry he missed this morning but he was asleep and id not hear me knocking on the door or calling his phone. I advised I would be there within 20 minutes and he agreed.

## 2017-12-15 NOTE — Telephone Encounter (Signed)
I called Douglas [name] from his front door after knocking for 5 minutes with not answer. We had an appointment scheduled for this morning thus I was calling to make sure he remembered He did not answer and I did not leave a message since I will reach out again later today.

## 2017-12-18 ENCOUNTER — Other Ambulatory Visit: Payer: Self-pay | Admitting: Pulmonary Disease

## 2018-01-02 ENCOUNTER — Telehealth (HOSPITAL_COMMUNITY): Payer: Self-pay

## 2018-01-02 NOTE — Telephone Encounter (Signed)
I called Douglas [name] to remind him about [facility] tomorrow. He stated he would be able to come if he had a ride so I let him know that they clinic would send a cab. I also wanted to ensure he would be at his clinic appointment on Friday and he said he would be there, but he would need a ride. I advised the clinic would send a cab. He was understanding and agreeable.

## 2018-01-05 ENCOUNTER — Encounter (HOSPITAL_COMMUNITY): Payer: Medicaid Other | Admitting: Cardiology

## 2018-01-05 ENCOUNTER — Ambulatory Visit (HOSPITAL_COMMUNITY): Payer: Medicaid Other

## 2018-01-09 ENCOUNTER — Other Ambulatory Visit (HOSPITAL_COMMUNITY): Payer: Self-pay

## 2018-01-09 NOTE — Progress Notes (Signed)
Paramedicine Encounter    Patient ID: Douglas Madura., male    DOB: Jun 26, 1985, 32 y.o.   MRN: [id number]   Patient Care Team: Medicine, [facility] as PCP - General Jorge Ny, LCSW as Social Worker (Licensed Clinical Social Worker)  Patient Active Problem List   Diagnosis Date Noted  . Morbid obesity ([place]) 06/17/2015  . Essential hypertension 04/14/2015  . Hyperkalemia 04/14/2015  . OSA (obstructive sleep apnea)   . Difficult intravenous access   . Chronic diastolic CHF (congestive heart failure), NYHA class 3 ([place]) 09/13/2014  . Cor pulmonale ([place]) 03/19/2014  . OSA treated with BiPAP     Current Outpatient Medications:  .  albuterol (PROVENTIL) 2 MG tablet, Take 2 mg by mouth 3 (three) times daily., Disp: , Rfl:  .  carvedilol (COREG) 6.25 MG tablet, TAKE 1 AND 1/2 TABLET BY MOUTH TWICE A DAY, Disp: 90 tablet, Rfl: 5 .  hydrALAZINE (APRESOLINE) 25 MG tablet, Take 0.5 tablets (12.5 mg total) by mouth 3 (three) times daily., Disp: 135 tablet, Rfl: 1 .  losartan (COZAAR) 50 MG tablet, TAKE ONE TABLET BY MOUTH TWICE DAILY, Disp: 60 tablet, Rfl: 11 .  NON FORMULARY, 2L of continuous oxygen, Disp: , Rfl:  .  PROVENTIL HFA 108 (90 Base) MCG/ACT inhaler, INHALE 1 TO 2 PUFFS BY MOUTH EVERY 6 (SIX) HOURS AS NEEDED FOR WHEEZING OR SHORTNESS OF BREATH, Disp: 6.7 g, Rfl: 1 .  torsemide (DEMADEX) 20 MG tablet, TAKE 3 (THREE) TABLETS BY MOUTH TWICE DAILY, Disp: 180 tablet, Rfl: 3 .  indomethacin (INDOCIN) 50 MG capsule, Take 1 capsule (50 mg total) by mouth 3 (three) times daily with meals. (Patient not taking: Reported on 01/09/2018), Disp: 15 capsule, Rfl: 0 Allergies  Allergen Reactions  . Lisinopril Cough      Social History   Socioeconomic History  . Marital status: Single    Spouse name: Not on file  . Number of children: 2  . Years of education: Not on file  . Highest education level: Not on file  Occupational History  . Occupation: N/A    Employer:  NOT EMPLOYED  Social Needs  . Financial resource strain: Not on file  . Food insecurity:    Worry: Not on file    Inability: Not on file  . Transportation needs:    Medical: Not on file    Non-medical: Not on file  Tobacco Use  . Smoking status: Former Smoker    Packs/day: 1.50    Years: 3.00    Pack years: 4.50    Types: Cigarettes    Last attempt to quit: 10/31/2013    Years since quitting: 4.1  . Smokeless tobacco: Never Used  Substance and Sexual Activity  . Alcohol use: No    Alcohol/week: 1.0 standard drinks    Types: 1 Cans of beer per week  . Drug use: No    Types: Cocaine    Comment: 10/31/2013 "might use cocaine once/month" - Has not used since 2015.   Marland Kitchen Sexual activity: Never  Lifestyle  . Physical activity:    Days per week: Not on file    Minutes per session: Not on file  . Stress: Not on file  Relationships  . Social connections:    Talks on phone: Not on file    Gets together: Not on file    Attends religious service: Not on file    Active member of club or organization: Not on file  Attends meetings of clubs or organizations: Not on file    Relationship status: Not on file  . Intimate partner violence:    Fear of current or ex partner: Not on file    Emotionally abused: Not on file    Physically abused: Not on file    Forced sexual activity: Not on file  Other Topics Concern  . Not on file  Social History Narrative  . Not on file    Physical Exam Cardiovascular:     Rate and Rhythm: Normal rate and regular rhythm.  Pulmonary:     Effort: Pulmonary effort is normal.     Breath sounds: Normal breath sounds.  Musculoskeletal: Normal range of motion.     Right lower leg: No edema.     Left lower leg: No edema.  Skin:    General: Skin is warm and dry.  Neurological:     Mental Status: He is alert.  Psychiatric:        Mood and Affect: Mood normal.         Future Appointments  Date Time Provider [place]  03/23/2018  9:00 AM  [place] [facility]CHO 1-BUZZ [facility]  03/23/2018 10:20 AM Larey Dresser, MD [facility] None    BP 100/78 (BP Location: Left Arm, Patient Position: Sitting, Cuff Size: Large)   Pulse 88   Resp 16   SpO2 98%   Weight yesterday- Did not weigh Last visit weight- 279 lb  Douglas [name] was seen at home today and reported feeling well. He denied chest pain, SOB, headache, dizziness or orthopnea. He stated he has been compliant with his medications since our last visit but he has not been weighing himself. When I asked him to weigh himself he did not have any batteries to go in his scale because he said his kids took the batteries out for something when they were there. His medications were verified and his pillbox was refilled. I asked that he weigh himself as soon as possible and let me know his weight. He was understanding and agreeable.   [name], EMT 01/09/18  ACTION: Home visit completed Next visit planned for 1 week

## 2018-01-11 ENCOUNTER — Other Ambulatory Visit (HOSPITAL_COMMUNITY): Payer: Self-pay

## 2018-01-11 NOTE — Progress Notes (Signed)
I saw Mr Gellatly to deliver a gift for his son from the HF clinic. As I was leaving he asked if I would be able to get him a few groceries like peanut butter and jelly sandwiches. I advised that I would reach out to the clinic and see what they could provide. I also went to the grocery store and purchased [place] breast sandwich meat, bread, mustard, peanut butter, jelly, grapes, milk and cereal. I returned this to him to ensure he would have food to eat until we can figure out a long term solution. I also reached out to [name] Sours, LCSW, and made her aware of the situation and she advised she would look into some options when she got back to the office tomorrow morning.

## 2018-01-12 ENCOUNTER — Other Ambulatory Visit: Payer: Self-pay | Admitting: Pulmonary Disease

## 2018-01-12 ENCOUNTER — Telehealth (HOSPITAL_COMMUNITY): Payer: Self-pay | Admitting: Licensed Clinical Social Worker

## 2018-01-12 NOTE — Telephone Encounter (Signed)
CSW informed by community paramedic that pt is out of food and in need of assistance.  CSW able to make a referral to [facility] food pantry and paramedic delivered food to patient.  No further concerns at this time.  CSW will continue to follow through paramedicine program  Jorge Ny, LCSW Clinical Social Worker [facility] [phone]

## 2018-02-01 IMAGING — NM NM PULMONARY VENT & PERF
16 series · 16 of 16 positions shown · non-contrast
Comparison: Chest x-ray today

CLINICAL DATA: Shortness of Breath

EXAM:
NUCLEAR MEDICINE VENTILATION - PERFUSION LUNG SCAN
TECHNIQUE: Ventilation images were obtained in multiple projections using
inhaled aerosol Sc-XXm DTPA. Perfusion images were obtained in
multiple projections after intravenous injection of Sc-XXm MAA.
RADIOPHARMACEUTICALS:  32.0 mCi 6echnetium-EEm DTPA aerosol
inhalation and 4.3 mCi 6echnetium-EEm MAA IV

[Series 1: ant/post vent · 4.14mm/px · 1 of 1 slices shown (1 of 2)]
[im 1/1]
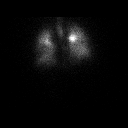

[Series 1: ant/post vent · 4.14mm/px · 1 of 1 slices shown (2 of 2)]
[im 1/1]
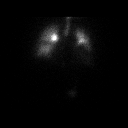

[Series 2: lao/rpo vent · 4.14mm/px · 1 of 1 slices shown (1 of 2)]
[im 1/1]
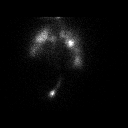

[Series 2: lao/rpo vent · 4.14mm/px · 1 of 1 slices shown (2 of 2)]
[im 1/1]
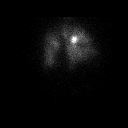

[Series 3: lpo/rao vent · 4.14mm/px · 1 of 1 slices shown (1 of 2)]
[im 1/1]
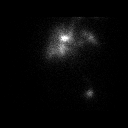

[Series 3: lpo/rao vent · 4.14mm/px · 1 of 1 slices shown (2 of 2)]
[im 1/1]
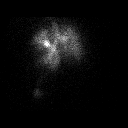

[Series 4: lt lat/rt lat vent · 4.14mm/px · 1 of 1 slices shown (1 of 2)]
[im 1/1]
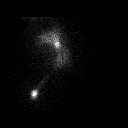

[Series 4: lt lat/rt lat vent · 4.14mm/px · 1 of 1 slices shown (2 of 2)]
[im 1/1]
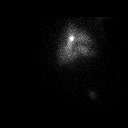

[Series 5: lt lat/rt lat perf · 4.14mm/px · 1 of 1 slices shown (1 of 2)]
[im 1/1]
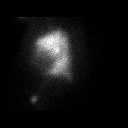

[Series 5: lt lat/rt lat perf · 4.14mm/px · 1 of 1 slices shown (2 of 2)]
[im 1/1]
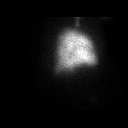

[Series 6: lpo/rao perf · 4.14mm/px · 1 of 1 slices shown (1 of 2)]
[im 1/1]
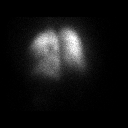

[Series 6: lpo/rao perf · 4.14mm/px · 1 of 1 slices shown (2 of 2)]
[im 1/1]
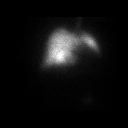

[Series 7: ant/post perf · 4.14mm/px · 1 of 1 slices shown (1 of 2)]
[im 1/1]
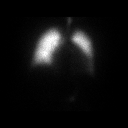

[Series 7: ant/post perf · 4.14mm/px · 1 of 1 slices shown (2 of 2)]
[im 1/1]
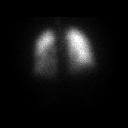

[Series 8: lao/rpo perf · 4.14mm/px · 1 of 1 slices shown (1 of 2)]
[im 1/1]
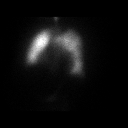

[Series 8: lao/rpo perf · 4.14mm/px · 1 of 1 slices shown (2 of 2)]
[im 1/1]
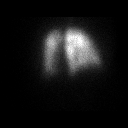

[16 of 16 positions shown; findings below may reference images not displayed]

FINDINGS: Ventilation: Patchy nonsegmental ventilation defects.

Perfusion: No wedge shaped peripheral perfusion defects to suggest
acute pulmonary embolism.
IMPRESSION: No evidence of pulmonary embolus.

## 2018-02-16 ENCOUNTER — Telehealth (HOSPITAL_COMMUNITY): Payer: Self-pay

## 2018-02-16 NOTE — Telephone Encounter (Signed)
I called [name] to see if he would be available for an appointment today. He did not answer so I left him a voicemail requesting that he call me back when he is able.

## 2018-02-16 NOTE — Telephone Encounter (Signed)
Douglas [name] returned my phone call and advised that his phone has been out of order and he had just received my voicemail. He stated he would be available for a meeting next week so we agreed to meet on Monday.

## 2018-02-19 ENCOUNTER — Telehealth (HOSPITAL_COMMUNITY): Payer: Self-pay

## 2018-02-19 NOTE — Telephone Encounter (Signed)
I called Douglas [name] to schedule an appointment. He stated that "today is not a good day" and asked if I could come tomorrow. I agreed and he stated he would be available around 14:00.

## 2018-02-20 ENCOUNTER — Other Ambulatory Visit (HOSPITAL_COMMUNITY): Payer: Self-pay

## 2018-02-20 NOTE — Progress Notes (Signed)
Paramedicine Encounter    Patient ID: Douglas Madura., male    DOB: [birth year], 33 y.o.   MRN: [id number]   Patient Care Team: Medicine, [facility] as PCP - General Jorge Ny, LCSW as Social Worker (Licensed Clinical Social Worker)  Patient Active Problem List   Diagnosis Date Noted  . Morbid obesity ([place]) 06/17/2015  . Essential hypertension 04/14/2015  . Hyperkalemia 04/14/2015  . OSA (obstructive sleep apnea)   . Difficult intravenous access   . Chronic diastolic CHF (congestive heart failure), NYHA class 3 ([place]) 09/13/2014  . Cor pulmonale ([place]) 03/19/2014  . OSA treated with BiPAP     Current Outpatient Medications:  .  carvedilol (COREG) 6.25 MG tablet, TAKE 1 AND 1/2 TABLET BY MOUTH TWICE A DAY, Disp: 90 tablet, Rfl: 5 .  hydrALAZINE (APRESOLINE) 25 MG tablet, Take 0.5 tablets (12.5 mg total) by mouth 3 (three) times daily., Disp: 135 tablet, Rfl: 1 .  losartan (COZAAR) 50 MG tablet, TAKE ONE TABLET BY MOUTH TWICE DAILY, Disp: 60 tablet, Rfl: 11 .  NON FORMULARY, 2L of continuous oxygen, Disp: , Rfl:  .  PROVENTIL HFA 108 (90 Base) MCG/ACT inhaler, INHALE 1 TO 2 PUFFS BY MOUTH EVERY 6 (SIX) HOURS AS NEEDED FOR WHEEZING OR SHORTNESS OF BREATH, Disp: 1 Inhaler, Rfl: 5 .  torsemide (DEMADEX) 20 MG tablet, TAKE 3 (THREE) TABLETS BY MOUTH TWICE DAILY, Disp: 180 tablet, Rfl: 3 .  albuterol (PROVENTIL) 2 MG tablet, Take 2 mg by mouth 3 (three) times daily., Disp: , Rfl:  .  indomethacin (INDOCIN) 50 MG capsule, Take 1 capsule (50 mg total) by mouth 3 (three) times daily with meals. (Patient not taking: Reported on 01/09/2018), Disp: 15 capsule, Rfl: 0 Allergies  Allergen Reactions  . Lisinopril Cough      Social History   Socioeconomic History  . Marital status: Single    Spouse name: Not on file  . Number of children: 2  . Years of education: Not on file  . Highest education level: Not on file  Occupational History  . Occupation: N/A     Employer: NOT EMPLOYED  Social Needs  . Financial resource strain: Not on file  . Food insecurity:    Worry: Not on file    Inability: Not on file  . Transportation needs:    Medical: Not on file    Non-medical: Not on file  Tobacco Use  . Smoking status: Former Smoker    Packs/day: 1.50    Years: 3.00    Pack years: 4.50    Types: Cigarettes    Last attempt to quit: 10/31/2013    Years since quitting: 4.3  . Smokeless tobacco: Never Used  Substance and Sexual Activity  . Alcohol use: No    Alcohol/week: 1.0 standard drinks    Types: 1 Cans of beer per week  . Drug use: No    Types: Cocaine    Comment: 10/31/2013 "might use cocaine once/month" - Has not used since 2015.   Marland Kitchen Sexual activity: Never  Lifestyle  . Physical activity:    Days per week: Not on file    Minutes per session: Not on file  . Stress: Not on file  Relationships  . Social connections:    Talks on phone: Not on file    Gets together: Not on file    Attends religious service: Not on file    Active member of club or organization: Not on file  Attends meetings of clubs or organizations: Not on file    Relationship status: Not on file  . Intimate partner violence:    Fear of current or ex partner: Not on file    Emotionally abused: Not on file    Physically abused: Not on file    Forced sexual activity: Not on file  Other Topics Concern  . Not on file  Social History Narrative  . Not on file    Physical Exam Cardiovascular:     Rate and Rhythm: Normal rate and regular rhythm.     Pulses: Normal pulses.  Pulmonary:     Effort: Pulmonary effort is normal.     Breath sounds: Normal breath sounds.  Musculoskeletal: Normal range of motion.     Right lower leg: No edema.     Left lower leg: No edema.  Skin:    General: Skin is warm and dry.     Capillary Refill: Capillary refill takes less than 2 seconds.  Neurological:     Mental Status: He is alert and oriented to person, place, and time.   Psychiatric:        Mood and Affect: Mood normal.         Future Appointments  Date Time Provider [place]  03/23/2018  9:00 AM MC ECHO 1-BUZZ [facility]  03/23/2018 10:20 AM Larey Dresser, MD MC-[facility]SC None    BP 118/72 (BP Location: Left Arm, Patient Position: Sitting, Cuff Size: Large)   Pulse 76   Resp 18   Wt 262 lb (118.8 kg)   SpO2 97%   BMI 43.87 kg/m   Weight yesterday- Did not weigh Last visit weight- UTO  Mr Champlain was seen at home today and reported feeling well. He denied chest pain, SOB, headache, dizziness or orthopnea. He stated he has been compliant with his medications and his weight seems to be dropping, though he has been out of scale batteries for some time so he has been unable to check it. Today I provided some previously used batteries which proved to be sufficient to obtain his weight. His medications were verified and he is not longer using a pillbox. He has an appointment at the end of next month for an echo and follow up with Dr Aundra Dubin. At that time we will discuss the necessity for paramedicine moving forward.  [name], EMT 02/20/18  ACTION: Home visit completed Next visit planned for 1 month

## 2018-02-21 ENCOUNTER — Other Ambulatory Visit (HOSPITAL_COMMUNITY): Payer: Self-pay | Admitting: Adult Health

## 2018-02-21 ENCOUNTER — Other Ambulatory Visit (HOSPITAL_COMMUNITY): Payer: Self-pay | Admitting: Cardiology

## 2018-03-09 ENCOUNTER — Telehealth (HOSPITAL_COMMUNITY): Payer: Self-pay

## 2018-03-09 NOTE — Telephone Encounter (Signed)
I called Douglas [name] to see if he would be available for a meeting around 11:00 this morning. He did not answer so I left a voicemail requesting he call me back.

## 2018-03-14 ENCOUNTER — Telehealth (HOSPITAL_COMMUNITY): Payer: Self-pay

## 2018-03-14 NOTE — Telephone Encounter (Signed)
I called Douglas [name] to schedule an appointment. He did not answer so I left a message requesting he call me back.  

## 2018-03-16 ENCOUNTER — Telehealth (HOSPITAL_COMMUNITY): Payer: Self-pay | Admitting: Adult Health

## 2018-03-16 ENCOUNTER — Other Ambulatory Visit (HOSPITAL_COMMUNITY): Payer: Self-pay

## 2018-03-16 MED ORDER — METOLAZONE 2.5 MG PO TABS: 2.5000 mg | ORAL_TABLET | ORAL | 3 refills | Status: DC | PRN

## 2018-03-16 NOTE — Telephone Encounter (Signed)
Received phone call from Douglas [name] regarding regarding 20 pound weight gain over the last month.   He was instructed to increase torsemide to 80 mg twice a day for 2 days then go back to 60 mg twice a day.   He will also add 2.5 mg metolazone today.   Instructed to limit sodium intake and limit fluid intake to < 2 liters per day.    Dov Dill NP-C  3:44 PM

## 2018-03-16 NOTE — Progress Notes (Signed)
Paramedicine Encounter    Patient ID: [name], male    DOB: 06/25/1985, 33 y.o.   MRN: [id number]   Patient Care Team: Medicine, [facility] as PCP - General  Patient Active Problem List   Diagnosis Date Noted  . Morbid obesity ([place]) 06/17/2015  . Essential hypertension 04/14/2015  . Hyperkalemia 04/14/2015  . OSA (obstructive sleep apnea)   . Difficult intravenous access   . Chronic diastolic CHF (congestive heart failure), NYHA class 3 ([place]) 09/13/2014  . Cor pulmonale ([place]) 03/19/2014  . OSA treated with BiPAP     Current Outpatient Medications:  .  albuterol (PROVENTIL) 2 MG tablet, Take 2 mg by mouth 3 (three) times daily., Disp: , Rfl:  .  carvedilol (COREG) 6.25 MG tablet, TAKE 1 AND 1/2 TABLET BY MOUTH TWICE A DAY, Disp: 90 tablet, Rfl: 5 .  hydrALAZINE (APRESOLINE) 25 MG tablet, TAKE ONE-HALF TABLET (12.5 MG TOTAL) BY MOUTH 3 (THREE) TIMES DAILY., Disp: 135 tablet, Rfl: 1 .  losartan (COZAAR) 50 MG tablet, TAKE ONE TABLET BY MOUTH TWICE DAILY, Disp: 60 tablet, Rfl: 11 .  NON FORMULARY, 2L of continuous oxygen, Disp: , Rfl:  .  PROVENTIL HFA 108 (90 Base) MCG/ACT inhaler, INHALE 1 TO 2 PUFFS BY MOUTH EVERY 6 (SIX) HOURS AS NEEDED FOR WHEEZING OR SHORTNESS OF BREATH, Disp: 1 Inhaler, Rfl: 5 .  torsemide (DEMADEX) 20 MG tablet, TAKE 3 (THREE) TABLETS BY MOUTH TWICE DAILY, Disp: 180 tablet, Rfl: 3 .  indomethacin (INDOCIN) 50 MG capsule, Take 1 capsule (50 mg total) by mouth 3 (three) times daily with meals. (Patient not taking: Reported on 01/09/2018), Disp: 15 capsule, Rfl: 0 Allergies  Allergen Reactions  . Lisinopril Cough      Social History   Socioeconomic History  . Marital status: Single    Spouse name: Not on file  . Number of children: 2  . Years of education: Not on file  . Highest education level: Not on file  Occupational History  . Occupation: N/A    Employer: NOT EMPLOYED  Social Needs  . Financial resource strain: Not on  file  . Food insecurity:    Worry: Not on file    Inability: Not on file  . Transportation needs:    Medical: Not on file    Non-medical: Not on file  Tobacco Use  . Smoking status: Former Smoker    Packs/day: 1.50    Years: 3.00    Pack years: 4.50    Types: Cigarettes    Last attempt to quit: 10/31/2013    Years since quitting: 4.3  . Smokeless tobacco: Never Used  Substance and Sexual Activity  . Alcohol use: No    Alcohol/week: 1.0 standard drinks    Types: 1 Cans of beer per week  . Drug use: No    Types: Cocaine    Comment: 10/31/2013 "might use cocaine once/month" - Has not used since 2015.   Marland Kitchen Sexual activity: Never  Lifestyle  . Physical activity:    Days per week: Not on file    Minutes per session: Not on file  . Stress: Not on file  Relationships  . Social connections:    Talks on phone: Not on file    Gets together: Not on file    Attends religious service: Not on file    Active member of club or organization: Not on file    Attends meetings of clubs or organizations: Not on file  Relationship status: Not on file  . Intimate partner violence:    Fear of current or ex partner: Not on file    Emotionally abused: Not on file    Physically abused: Not on file    Forced sexual activity: Not on file  Other Topics Concern  . Not on file  Social History Narrative  . Not on file    Physical Exam Cardiovascular:     Rate and Rhythm: Normal rate and regular rhythm.     Pulses: Normal pulses.  Pulmonary:     Effort: Pulmonary effort is normal.     Breath sounds: Normal breath sounds.  Musculoskeletal: Normal range of motion.     Right lower leg: No edema.     Left lower leg: No edema.  Skin:    General: Skin is warm and dry.     Capillary Refill: Capillary refill takes less than 2 seconds.  Neurological:     Mental Status: He is alert and oriented to person, place, and time.         Future Appointments  Date Time Provider [place]   03/23/2018  9:00 AM [facility] ECHO 1-BUZZ [facility]  03/23/2018 10:20 AM Larey Dresser, MD [facility] None    BP 114/82 (BP Location: Left Arm, Patient Position: Sitting, Cuff Size: Large)   Pulse 84   Resp 18   Wt 288 lb (130.6 kg)   SpO2 96%   BMI 48.22 kg/m   Weight yesterday- Did not weigh Last visit weight- 262 lb  Mr Daubenspeck was seen at home today and reported feeling unwell. He denied chest pain, SOB, headache, dizziness or orthopnea but stated he has been feeling "really tired." He stated he was concerned that he had gained a significant amount of weight since our las visit. He said doesn't have much of an appetite but when he does eat he has been eating a lot of canned vegetables but buying the reduced sodium cans and rinsing them before he cooks. He stated he didn't think he was drinking too much and reported being compliant with his diuretics. He has not been compliant with hydralazine, having only been taking it BID rather than TID. I stressed the importance of being medication compliant and he expressed understanding. I contacted [name] Clegg, NP-C who ordered that he increase torsemide to 80 mg BID through Sunday and 2.5 mg of metolazone when he picks it up from the pharmacy. I relaying this information to him and made him repeat it back to me three times to be sure he understood. I then wrote the instructions down on a piece of paper and put it in his red medication bag. He maintains that he does not want to use a pillbox and is able to keep up using the bottles. I will follow up next week at his appointment in the HF clinic.   [name], EMT 03/16/18  ACTION: Home visit completed Next visit planned for 1 week

## 2018-03-22 ENCOUNTER — Telehealth (HOSPITAL_COMMUNITY): Payer: Self-pay

## 2018-03-22 NOTE — Telephone Encounter (Signed)
Douglas [name] called me to ask if I would reschedule his appointment for him because he did not want to have to get up so early. Initially he asked if they could see him at 14:00 tomorrow however I explained that they were likely already book for the whole day so switching times on such short notice would be difficult. He stated he understood and would keep his appointments for tomorrow. He asked that I call him in the morning and make sure he is awake so I advised I would call at 08:00. I told him to be sure his phone is charged with the ringer on high volume and he was agreeable.

## 2018-03-23 ENCOUNTER — Encounter (HOSPITAL_COMMUNITY): Payer: Medicaid Other | Admitting: Cardiology

## 2018-03-23 ENCOUNTER — Ambulatory Visit (HOSPITAL_COMMUNITY): Admission: RE | Admit: 2018-03-23 | Payer: Medicaid Other | Source: Ambulatory Visit

## 2018-04-06 ENCOUNTER — Telehealth (HOSPITAL_COMMUNITY): Payer: Self-pay

## 2018-04-06 NOTE — Telephone Encounter (Signed)
I called [name] to see if he was available to meet today. He did not answer so I left a voicemail requesting that he call me back when he is able.

## 2018-04-10 ENCOUNTER — Telehealth (HOSPITAL_COMMUNITY): Payer: Self-pay

## 2018-04-10 ENCOUNTER — Other Ambulatory Visit (HOSPITAL_COMMUNITY): Payer: Self-pay | Admitting: Cardiology

## 2018-04-10 NOTE — Telephone Encounter (Signed)
Mr Leventhal called me to let me know he had cancelled his appointment for an echo tomorrow due to fears of Covid-19. He stated he has already rescheduled the appointment. He did ask that I come out tomorrow morning for a check up since he would not be at the doctor's office. We agreed to meet at 11:15

## 2018-04-11 ENCOUNTER — Telehealth (HOSPITAL_COMMUNITY): Payer: Self-pay

## 2018-04-11 ENCOUNTER — Other Ambulatory Visit (HOSPITAL_COMMUNITY): Payer: Medicaid Other

## 2018-04-11 NOTE — Telephone Encounter (Signed)
I called Mr Keisler to schedule an appointment. He stated he was about to leave the house today but would be able to meet me on Friday so we agreed to meet at 10:00

## 2018-04-13 ENCOUNTER — Other Ambulatory Visit (HOSPITAL_COMMUNITY): Payer: Self-pay

## 2018-04-13 ENCOUNTER — Encounter (HOSPITAL_COMMUNITY): Payer: Medicaid Other | Admitting: Cardiology

## 2018-04-13 NOTE — Progress Notes (Signed)
Paramedicine Encounter    Patient ID: [name], male    DOB: 26-Sep-1985, 33 y.o.   MRN: [id number]   Patient Care Team: Medicine, [facility] as PCP - General  Patient Active Problem List   Diagnosis Date Noted  . Morbid obesity ([place]) 06/17/2015  . Essential hypertension 04/14/2015  . Hyperkalemia 04/14/2015  . OSA (obstructive sleep apnea)   . Difficult intravenous access   . Chronic diastolic CHF (congestive heart failure), NYHA class 3 ([place]) 09/13/2014  . Cor pulmonale ([place]) 03/19/2014  . OSA treated with BiPAP     Current Outpatient Medications:  .  albuterol (PROVENTIL) 2 MG tablet, Take 2 mg by mouth 3 (three) times daily., Disp: , Rfl:  .  carvedilol (COREG) 6.25 MG tablet, TAKE 1 AND 1/2 TABLET BY MOUTH TWICE A DAY, Disp: 90 tablet, Rfl: 5 .  hydrALAZINE (APRESOLINE) 25 MG tablet, TAKE ONE-HALF TABLET (12.5 MG TOTAL) BY MOUTH 3 (THREE) TIMES DAILY., Disp: 135 tablet, Rfl: 1 .  losartan (COZAAR) 50 MG tablet, TAKE ONE TABLET BY MOUTH TWICE DAILY, Disp: 60 tablet, Rfl: 11 .  NON FORMULARY, 2L of continuous oxygen, Disp: , Rfl:  .  PROVENTIL HFA 108 (90 Base) MCG/ACT inhaler, INHALE 1 TO 2 PUFFS BY MOUTH EVERY 6 (SIX) HOURS AS NEEDED FOR WHEEZING OR SHORTNESS OF BREATH, Disp: 1 Inhaler, Rfl: 5 .  torsemide (DEMADEX) 20 MG tablet, TAKE 3 (THREE) TABLETS BY MOUTH TWICE DAILY, Disp: 180 tablet, Rfl: 3 .  indomethacin (INDOCIN) 50 MG capsule, Take 1 capsule (50 mg total) by mouth 3 (three) times daily with meals. (Patient not taking: Reported on 01/09/2018), Disp: 15 capsule, Rfl: 0 .  metolazone (ZAROXOLYN) 2.5 MG tablet, Take 1 tablet (2.5 mg total) by mouth as needed. (Patient not taking: Reported on 04/13/2018), Disp: 4 tablet, Rfl: 3 Allergies  Allergen Reactions  . Lisinopril Cough      Social History   Socioeconomic History  . Marital status: Single    Spouse name: Not on file  . Number of children: 2  . Years of education: Not on file  .  Highest education level: Not on file  Occupational History  . Occupation: N/A    Employer: NOT EMPLOYED  Social Needs  . Financial resource strain: Not on file  . Food insecurity:    Worry: Not on file    Inability: Not on file  . Transportation needs:    Medical: Not on file    Non-medical: Not on file  Tobacco Use  . Smoking status: Former Smoker    Packs/day: 1.50    Years: 3.00    Pack years: 4.50    Types: Cigarettes    Last attempt to quit: 10/31/2013    Years since quitting: 4.4  . Smokeless tobacco: Never Used  Substance and Sexual Activity  . Alcohol use: No    Alcohol/week: 1.0 standard drinks    Types: 1 Cans of beer per week  . Drug use: No    Types: Cocaine    Comment: 10/31/2013 "might use cocaine once/month" - Has not used since 2015.   Marland Kitchen Sexual activity: Never  Lifestyle  . Physical activity:    Days per week: Not on file    Minutes per session: Not on file  . Stress: Not on file  Relationships  . Social connections:    Talks on phone: Not on file    Gets together: Not on file    Attends religious  service: Not on file    Active member of club or organization: Not on file    Attends meetings of clubs or organizations: Not on file    Relationship status: Not on file  . Intimate partner violence:    Fear of current or ex partner: Not on file    Emotionally abused: Not on file    Physically abused: Not on file    Forced sexual activity: Not on file  Other Topics Concern  . Not on file  Social History Narrative  . Not on file    Physical Exam Cardiovascular:     Rate and Rhythm: Normal rate and regular rhythm.     Pulses: Normal pulses.  Pulmonary:     Effort: Pulmonary effort is normal.     Breath sounds: Normal breath sounds.  Musculoskeletal: Normal range of motion.     Right lower leg: Edema present.     Left lower leg: Edema present.  Skin:    General: Skin is warm and dry.     Capillary Refill: Capillary refill takes less than 2 seconds.   Neurological:     Mental Status: He is alert and oriented to person, place, and time.  Psychiatric:        Mood and Affect: Mood normal.         Future Appointments  Date Time Provider [place]  06/22/2018  1:00 PM [place] ECHO 1-BUZZ [facility]  06/22/2018  2:20 PM Larey Dresser, MD [facility] None    BP (!) 132/99 (BP Location: Left Arm, Patient Position: Sitting, Cuff Size: Normal)   Pulse 80   Resp 18   Wt 278 lb 3.2 oz (126.2 kg)   SpO2 90%   BMI 46.58 kg/m   Weight yesterday- Did not weigh Last visit weight- 288 lb  Mr Hoffmann was seen at home today and reported feeling well. He denied chest pain, SOB, headache, dizziness or orthopnea. He has not been wearing his CPAP or oxygen at night and was initially hypoxic upon my arrival this morning. I explained the importance of wearing the CPAP or oxygen at all times and he expressed understanding. He also stated that he has not been taking his hydralazine as prescribed because her doesn't think he "needs that much medicine". I explained why he takes this medication TID and urged him to become compliant. His medications were verified and had all be refilled recently. I will follow up next week.   [name], EMT 04/13/18  ACTION: Home visit completed Next visit planned for 1 week

## 2018-04-16 ENCOUNTER — Telehealth (HOSPITAL_COMMUNITY): Payer: Self-pay | Admitting: Licensed Clinical Social Worker

## 2018-04-16 NOTE — Telephone Encounter (Signed)
CSW reached out to pt to check in regarding food and medication status at this time.   Pt reports that he currently is experiencing a food shortage- states that his refrigerator is empty at this time- is unsure if he can get out to get more food as transportation is an issue.  Pt agreeable to food delivery through [place]- anticipate delivery Wednesday- pt believes he will have sufficient food to last him until that time.  CSW confirmed correct address and contact information.   CSW encouraged pt to reach out with any concerns and will continue to follow and assist as needed  Jorge Ny, [place] Worker [facility] [phone]

## 2018-04-17 ENCOUNTER — Telehealth (HOSPITAL_COMMUNITY): Payer: Self-pay | Admitting: Licensed Clinical Social Worker

## 2018-04-17 NOTE — Telephone Encounter (Signed)
Patient identified as a candidate to receive 14 heart healthy meals per week for 3 months through [facility] partnership with [employer] program.   Completed referral sent in for review.  Anticipate patient will receive first shipment of food in 1-3 business days.  Douglas [name] H. Tasmine Hipwell, LCSW Clinical Social Worker [facility] Desk#: [phone] Cell#: [phone]   

## 2018-04-25 ENCOUNTER — Other Ambulatory Visit (HOSPITAL_COMMUNITY): Payer: Self-pay

## 2018-04-25 ENCOUNTER — Telehealth (HOSPITAL_COMMUNITY): Payer: Self-pay | Admitting: Licensed Clinical Social Worker

## 2018-04-25 NOTE — Telephone Encounter (Signed)
CSW called pt to check how first [place] delivery went.  Pt reports that his delivery was received successfully and he has enjoyed the meals so far.  Pt reports no other concerns at this time  CSW will continue to follow and assist as needed  Jorge Ny, [facility] Desk#: [phone] Cell#: [phone]

## 2018-04-25 NOTE — Progress Notes (Signed)
Paramedicine Encounter    Patient ID: Douglas [name]., male    DOB: 09-24-1985, 33 y.o.   MRN: [id number]   Patient Care Team: Medicine, [facility] as PCP - General  Patient Active Problem List   Diagnosis Date Noted  . Morbid obesity ([place]) 06/17/2015  . Essential hypertension 04/14/2015  . Hyperkalemia 04/14/2015  . OSA (obstructive sleep apnea)   . Difficult intravenous access   . Chronic diastolic CHF (congestive heart failure), NYHA class 3 ([name]) 09/13/2014  . Cor pulmonale ([name]) 03/19/2014  . OSA treated with BiPAP     Current Outpatient Medications:  .  albuterol (PROVENTIL) 2 MG tablet, Take 2 mg by mouth 3 (three) times daily., Disp: , Rfl:  .  carvedilol (COREG) 6.25 MG tablet, TAKE 1 AND 1/2 TABLET BY MOUTH TWICE A DAY, Disp: 90 tablet, Rfl: 5 .  hydrALAZINE (APRESOLINE) 25 MG tablet, TAKE ONE-HALF TABLET (12.5 MG TOTAL) BY MOUTH 3 (THREE) TIMES DAILY., Disp: 135 tablet, Rfl: 1 .  indomethacin (INDOCIN) 50 MG capsule, Take 1 capsule (50 mg total) by mouth 3 (three) times daily with meals. (Patient not taking: Reported on 01/09/2018), Disp: 15 capsule, Rfl: 0 .  losartan (COZAAR) 50 MG tablet, TAKE ONE TABLET BY MOUTH TWICE DAILY, Disp: 60 tablet, Rfl: 11 .  metolazone (ZAROXOLYN) 2.5 MG tablet, Take 1 tablet (2.5 mg total) by mouth as needed. (Patient not taking: Reported on 04/13/2018), Disp: 4 tablet, Rfl: 3 .  NON FORMULARY, 2L of continuous oxygen, Disp: , Rfl:  .  PROVENTIL HFA 108 (90 Base) MCG/ACT inhaler, INHALE 1 TO 2 PUFFS BY MOUTH EVERY 6 (SIX) HOURS AS NEEDED FOR WHEEZING OR SHORTNESS OF BREATH, Disp: 1 Inhaler, Rfl: 5 .  torsemide (DEMADEX) 20 MG tablet, TAKE 3 (THREE) TABLETS BY MOUTH TWICE DAILY, Disp: 180 tablet, Rfl: 3 Allergies  Allergen Reactions  . Lisinopril Cough      Social History   Socioeconomic History  . Marital status: Single    Spouse name: Not on file  . Number of children: 2  . Years of education: Not on file  .  Highest education level: Not on file  Occupational History  . Occupation: N/A    Employer: NOT EMPLOYED  Social Needs  . Financial resource strain: Not on file  . Food insecurity:    Worry: Not on file    Inability: Not on file  . Transportation needs:    Medical: Not on file    Non-medical: Not on file  Tobacco Use  . Smoking status: Former Smoker    Packs/day: 1.50    Years: 3.00    Pack years: 4.50    Types: Cigarettes    Last attempt to quit: 10/31/2013    Years since quitting: 4.4  . Smokeless tobacco: Never Used  Substance and Sexual Activity  . Alcohol use: No    Alcohol/week: 1.0 standard drinks    Types: 1 Cans of beer per week  . Drug use: No    Types: Cocaine    Comment: 10/31/2013 "might use cocaine once/month" - Has not used since 2015.   Marland Kitchen Sexual activity: Never  Lifestyle  . Physical activity:    Days per week: Not on file    Minutes per session: Not on file  . Stress: Not on file  Relationships  . Social connections:    Talks on phone: Not on file    Gets together: Not on file    Attends religious  service: Not on file    Active member of club or organization: Not on file    Attends meetings of clubs or organizations: Not on file    Relationship status: Not on file  . Intimate partner violence:    Fear of current or ex partner: Not on file    Emotionally abused: Not on file    Physically abused: Not on file    Forced sexual activity: Not on file  Other Topics Concern  . Not on file  Social History Narrative  . Not on file    Physical Exam Cardiovascular:     Rate and Rhythm: Normal rate and regular rhythm.     Pulses: Normal pulses.  Pulmonary:     Effort: Pulmonary effort is normal.     Breath sounds: Normal breath sounds.  Musculoskeletal: Normal range of motion.     Right lower leg: No edema.     Left lower leg: No edema.  Skin:    General: Skin is warm and dry.     Capillary Refill: Capillary refill takes less than 2 seconds.   Neurological:     Mental Status: He is alert and oriented to person, place, and time.  Psychiatric:        Mood and Affect: Mood normal.         Future Appointments  Date Time Provider [place]  06/22/2018  1:00 PM [place] ECHO 1-BUZZ [facility]  06/22/2018  2:20 PM Larey Dresser, MD [facility] None    BP (!) 133/99 (BP Location: Left Arm, Patient Position: Sitting, Cuff Size: Normal)   Pulse 90   Resp 18   SpO2 90%   Weight yesterday- Did not weigh Last visit weight- 278 lb  Mr Vivero was seen at home today and reported feeling well. He denied chest pain, SOB, headache, dizziness or orthopnea. He stated he has been compliant with his medications since I last saw him however he had not taken them this morning.Also, he has not been weighing daily. He stated the batteries in his scale died but he would get new ones today and text me with his weight. He stated he feels like he has been losing weight which appeared to be true from his stature. His medications were verified and I will follow up next week.   Douglas [name], EMT 04/25/18  ACTION: Home visit completed Next visit planned for 1 week

## 2018-05-04 ENCOUNTER — Telehealth (HOSPITAL_COMMUNITY): Payer: Self-pay

## 2018-05-04 NOTE — Telephone Encounter (Signed)
I called Douglas [name] to schedule an appointment. He did not answer so I left a message requesting he call me back.

## 2018-05-09 ENCOUNTER — Telehealth (HOSPITAL_COMMUNITY): Payer: Self-pay | Admitting: Licensed Clinical Social Worker

## 2018-05-09 NOTE — Telephone Encounter (Signed)
CSW contacted patient to check and make sure of adequate food and medications and remind of the importance to stay home and social distance when possible. CSW left message as no answer and encouraged patient to return call if needed. Raquel Sarna, [place], [place]

## 2018-05-10 ENCOUNTER — Telehealth (HOSPITAL_COMMUNITY): Payer: Self-pay

## 2018-05-10 NOTE — Telephone Encounter (Signed)
I called Douglas [name] to schedule an appointment. He stated he would be available all day tomorrow but would prefer an afternoon appointment. We agreed to meet at 13:00.

## 2018-05-15 ENCOUNTER — Telehealth (HOSPITAL_COMMUNITY): Payer: Self-pay

## 2018-05-15 NOTE — Telephone Encounter (Signed)
[name] call me to say he received my message last week about canceling our appointment and wanted to know when we were going to reschedule. I advised that I could meet at any time this week. We agreed to meet Friday at 13:00. While on the phone he advised that he is not eating the meals that are being delivered, fast enough and he is starting to run out of room in his freezer. I advised him that these meals could go in the refrigerator. He was understanding and agreed but asked that I find out if he can put the meals on hold. I passed this along to [name] Sours, LCSW, since she is looking into this for another patient. I told him to expect a delivery this week since it likely has already shipped and he was understanding.

## 2018-05-18 ENCOUNTER — Telehealth (HOSPITAL_COMMUNITY): Payer: Self-pay | Admitting: Licensed Clinical Social Worker

## 2018-05-18 ENCOUNTER — Other Ambulatory Visit (HOSPITAL_COMMUNITY): Payer: Self-pay

## 2018-05-18 NOTE — Telephone Encounter (Signed)
CSW reached out to pt to check in regarding food and medication status at this time. No concerns at this time.  Pt just trying to stay busy but is trying to get outside as much as weather permits.  CSW encouraged pt to reach out with any concerns and will continue to follow and assist as needed  Jorge Ny, [place] Worker [facility] [phone]

## 2018-05-18 NOTE — Progress Notes (Addendum)
Paramedicine Encounter    Patient ID: Douglas Madura., male    DOB: 07-16-1985, 33 y.o.   MRN: [id number]   Patient Care Team: Medicine, [facility] as PCP - General  Patient Active Problem List   Diagnosis Date Noted  . Morbid obesity ([place]) 06/17/2015  . Essential hypertension 04/14/2015  . Hyperkalemia 04/14/2015  . OSA (obstructive sleep apnea)   . Difficult intravenous access   . Chronic diastolic CHF (congestive heart failure), NYHA class 3 ([place]) 09/13/2014  . Cor pulmonale ([place]) 03/19/2014  . OSA treated with BiPAP     Current Outpatient Medications:  .  albuterol (PROVENTIL) 2 MG tablet, Take 2 mg by mouth 3 (three) times daily., Disp: , Rfl:  .  carvedilol (COREG) 6.25 MG tablet, TAKE 1 AND 1/2 TABLET BY MOUTH TWICE A DAY, Disp: 90 tablet, Rfl: 5 .  hydrALAZINE (APRESOLINE) 25 MG tablet, TAKE ONE-HALF TABLET (12.5 MG TOTAL) BY MOUTH 3 (THREE) TIMES DAILY., Disp: 135 tablet, Rfl: 1 .  losartan (COZAAR) 50 MG tablet, TAKE ONE TABLET BY MOUTH TWICE DAILY, Disp: 60 tablet, Rfl: 11 .  NON FORMULARY, 2L of continuous oxygen, Disp: , Rfl:  .  PROVENTIL HFA 108 (90 Base) MCG/ACT inhaler, INHALE 1 TO 2 PUFFS BY MOUTH EVERY 6 (SIX) HOURS AS NEEDED FOR WHEEZING OR SHORTNESS OF BREATH, Disp: 1 Inhaler, Rfl: 5 .  torsemide (DEMADEX) 20 MG tablet, TAKE 3 (THREE) TABLETS BY MOUTH TWICE DAILY, Disp: 180 tablet, Rfl: 3 .  indomethacin (INDOCIN) 50 MG capsule, Take 1 capsule (50 mg total) by mouth 3 (three) times daily with meals. (Patient not taking: Reported on 01/09/2018), Disp: 15 capsule, Rfl: 0 .  metolazone (ZAROXOLYN) 2.5 MG tablet, Take 1 tablet (2.5 mg total) by mouth as needed. (Patient not taking: Reported on 04/13/2018), Disp: 4 tablet, Rfl: 3 Allergies  Allergen Reactions  . Lisinopril Cough      Social History   Socioeconomic History  . Marital status: Single    Spouse name: Not on file  . Number of children: 2  . Years of education: Not on file  .  Highest education level: Not on file  Occupational History  . Occupation: N/A    Employer: NOT EMPLOYED  Social Needs  . Financial resource strain: Not on file  . Food insecurity:    Worry: Not on file    Inability: Not on file  . Transportation needs:    Medical: Not on file    Non-medical: Not on file  Tobacco Use  . Smoking status: Former Smoker    Packs/day: 1.50    Years: 3.00    Pack years: 4.50    Types: Cigarettes    Last attempt to quit: 10/31/2013    Years since quitting: 4.5  . Smokeless tobacco: Never Used  Substance and Sexual Activity  . Alcohol use: No    Alcohol/week: 1.0 standard drinks    Types: 1 Cans of beer per week  . Drug use: No    Types: Cocaine    Comment: 10/31/2013 "might use cocaine once/month" - Has not used since 2015.   Marland Kitchen Sexual activity: Never  Lifestyle  . Physical activity:    Days per week: Not on file    Minutes per session: Not on file  . Stress: Not on file  Relationships  . Social connections:    Talks on phone: Not on file    Gets together: Not on file    Attends religious  service: Not on file    Active member of club or organization: Not on file    Attends meetings of clubs or organizations: Not on file    Relationship status: Not on file  . Intimate partner violence:    Fear of current or ex partner: Not on file    Emotionally abused: Not on file    Physically abused: Not on file    Forced sexual activity: Not on file  Other Topics Concern  . Not on file  Social History Narrative  . Not on file    Physical Exam Cardiovascular:     Rate and Rhythm: Normal rate and regular rhythm.     Pulses: Normal pulses.  Pulmonary:     Effort: Pulmonary effort is normal.     Breath sounds: Normal breath sounds.  Musculoskeletal: Normal range of motion.     Right lower leg: No edema.     Left lower leg: No edema.  Skin:    General: Skin is warm and dry.     Capillary Refill: Capillary refill takes less than 2 seconds.   Neurological:     Mental Status: He is alert and oriented to person, place, and time.  Psychiatric:        Mood and Affect: Mood normal.         Future Appointments  Date Time Provider [place]  06/22/2018  1:00 PM [place] ECHO 1-BUZZ [facility]  06/22/2018  2:20 PM Larey Dresser, MD [facility] None    BP (!) 145/106 (BP Location: Left Arm, Patient Position: Sitting, Cuff Size: Normal)   Pulse 80   Resp 18   Wt 266 lb 4.8 oz (120.8 kg)   SpO2 91%   BMI 44.59 kg/m   Weight yesterday- Did not weigh Last visit weight- Did not weigh  Douglas [name] was seen at home today and reported feeling well. He denied chest pain, SOB, headache, dizziness, orthopnea, cough or fever since our last visit. He stated he has been compliant with his medications but had just gotten up when I arrived so he had not taken them yet. His medications were verified and since he is not using a pillbox anymore, I asked to to tell me how each medication should be taken. He was able to do so without too much difficulty. I did learn that he had been taking losartan incorrectly due to a change in the supply from the pharmacy. The gave him 25 mg tablets instead of 50 mg which he was supposed to have and as a result he was only taking 25 mg BID. I explained this to him and he expressed understanding and was able to repeat the instructions back without issue. Refills of carvedilol and losartan were ordered and I confirmed with the pharmacy that they would give him 50 mg tablets. He stated he would be able to get the medications with the assistance of his family, either tonight or tomorrow. I will follow up next week.   [name], EMT 05/18/18  ACTION: Home visit completed Next visit planned for 1 week

## 2018-05-21 ENCOUNTER — Telehealth (HOSPITAL_COMMUNITY): Payer: Self-pay | Admitting: Licensed Clinical Social Worker

## 2018-05-21 NOTE — Telephone Encounter (Signed)
Pt has been getting 14 meals a week from [employer] but reports this is too much food and he does not have enough space for it and is concerned about wasting food.  CSW discussed with [facility] partners who were able to work it out with [employer] to only send 10 meals per week.  CSW informed pt who is agreeable to this plan.  CSW will continue to follow and assist as needed  Jorge Ny, [facility] Desk#: [phone] Cell#: [phone]

## 2018-05-25 ENCOUNTER — Telehealth (HOSPITAL_COMMUNITY): Payer: Self-pay

## 2018-05-25 NOTE — Telephone Encounter (Signed)
I called Douglas [name] to see how he was doing. He reported feeling well and stated he has all of his medications. I asked if he was in need of a visit today and he said he was fine and would see me next week. I will call next week to schedule a visit.

## 2018-06-05 ENCOUNTER — Telehealth (HOSPITAL_COMMUNITY): Payer: Self-pay

## 2018-06-05 NOTE — Telephone Encounter (Signed)
Douglas [name] called me to schedule an appointment for the end of this week. He stated he would be free on Thursday afternoon so we agreed to meet at 15:00.

## 2018-06-07 ENCOUNTER — Other Ambulatory Visit (HOSPITAL_COMMUNITY): Payer: Self-pay

## 2018-06-07 NOTE — Progress Notes (Signed)
Paramedicine Encounter    Patient ID: Douglas Madura., male    DOB: 11-Mar-1985, 33 y.o.   MRN: [id number]   Patient Care Team: Medicine, [facility] as PCP - General  Patient Active Problem List   Diagnosis Date Noted  . Morbid obesity ([place]) 06/17/2015  . Essential hypertension 04/14/2015  . Hyperkalemia 04/14/2015  . OSA (obstructive sleep apnea)   . Difficult intravenous access   . Chronic diastolic CHF (congestive heart failure), NYHA class 3 ([place]) 09/13/2014  . Cor pulmonale ([place]) 03/19/2014  . OSA treated with BiPAP     Current Outpatient Medications:  .  albuterol (PROVENTIL) 2 MG tablet, Take 2 mg by mouth 3 (three) times daily., Disp: , Rfl:  .  carvedilol (COREG) 6.25 MG tablet, TAKE 1 AND 1/2 TABLET BY MOUTH TWICE A DAY, Disp: 90 tablet, Rfl: 5 .  hydrALAZINE (APRESOLINE) 25 MG tablet, TAKE ONE-HALF TABLET (12.5 MG TOTAL) BY MOUTH 3 (THREE) TIMES DAILY., Disp: 135 tablet, Rfl: 1 .  losartan (COZAAR) 50 MG tablet, TAKE ONE TABLET BY MOUTH TWICE DAILY, Disp: 60 tablet, Rfl: 11 .  NON FORMULARY, 2L of continuous oxygen, Disp: , Rfl:  .  PROVENTIL HFA 108 (90 Base) MCG/ACT inhaler, INHALE 1 TO 2 PUFFS BY MOUTH EVERY 6 (SIX) HOURS AS NEEDED FOR WHEEZING OR SHORTNESS OF BREATH, Disp: 1 Inhaler, Rfl: 5 .  torsemide (DEMADEX) 20 MG tablet, TAKE 3 (THREE) TABLETS BY MOUTH TWICE DAILY, Disp: 180 tablet, Rfl: 3 .  indomethacin (INDOCIN) 50 MG capsule, Take 1 capsule (50 mg total) by mouth 3 (three) times daily with meals. (Patient not taking: Reported on 01/09/2018), Disp: 15 capsule, Rfl: 0 .  metolazone (ZAROXOLYN) 2.5 MG tablet, Take 1 tablet (2.5 mg total) by mouth as needed. (Patient not taking: Reported on 04/13/2018), Disp: 4 tablet, Rfl: 3 Allergies  Allergen Reactions  . Lisinopril Cough      Social History   Socioeconomic History  . Marital status: Single    Spouse name: Not on file  . Number of children: 2  . Years of education: Not on file  .  Highest education level: Not on file  Occupational History  . Occupation: N/A    Employer: NOT EMPLOYED  Social Needs  . Financial resource strain: Not on file  . Food insecurity:    Worry: Not on file    Inability: Not on file  . Transportation needs:    Medical: Not on file    Non-medical: Not on file  Tobacco Use  . Smoking status: Former Smoker    Packs/day: 1.50    Years: 3.00    Pack years: 4.50    Types: Cigarettes    Last attempt to quit: 10/31/2013    Years since quitting: 4.6  . Smokeless tobacco: Never Used  Substance and Sexual Activity  . Alcohol use: No    Alcohol/week: 1.0 standard drinks    Types: 1 Cans of beer per week  . Drug use: No    Types: Cocaine    Comment: 10/31/2013 "might use cocaine once/month" - Has not used since 2015.   Marland Kitchen Sexual activity: Never  Lifestyle  . Physical activity:    Days per week: Not on file    Minutes per session: Not on file  . Stress: Not on file  Relationships  . Social connections:    Talks on phone: Not on file    Gets together: Not on file    Attends religious  service: Not on file    Active member of club or organization: Not on file    Attends meetings of clubs or organizations: Not on file    Relationship status: Not on file  . Intimate partner violence:    Fear of current or ex partner: Not on file    Emotionally abused: Not on file    Physically abused: Not on file    Forced sexual activity: Not on file  Other Topics Concern  . Not on file  Social History Narrative  . Not on file    Physical Exam Cardiovascular:     Rate and Rhythm: Normal rate and regular rhythm.     Pulses: Normal pulses.  Pulmonary:     Effort: Pulmonary effort is normal.     Breath sounds: Normal breath sounds.  Musculoskeletal: Normal range of motion.     Right lower leg: No edema.     Left lower leg: No edema.  Skin:    General: Skin is warm and dry.     Capillary Refill: Capillary refill takes less than 2 seconds.   Neurological:     Mental Status: He is alert and oriented to person, place, and time.  Psychiatric:        Mood and Affect: Mood normal.         Future Appointments  Date Time Provider [place]  06/22/2018  1:00 PM [place] ECHO 1-BUZZ [facility]  06/22/2018  2:20 PM Larey Dresser, MD [facility] None    BP 126/90 (BP Location: Left Arm, Patient Position: Sitting, Cuff Size: Normal)   Pulse 70   Resp 16   Wt 258 lb 9.6 oz (117.3 kg)   SpO2 94%   BMI 43.30 kg/m   Weight yesterday- 262 lb Last visit weight- 266 lb  Douglas [name] was seen at home today and reported feeling generally well. He denied chest pain, SOB, headache, dizziness, orthopnea, cough or fever. He stated he has been compliant with his medications however he has been consistently missing his afternoon dose of hydralazine. I explained why taking it TID is necessary and he said he understood and would start taking it as prescribed. His weight has been trending down and stated he has been eating the [employer] we have been having sent out and believes that is helping him lose weight. His medications were verified and he continues to not use a pillbox.   [name], EMT 06/07/18  ACTION: Home visit completed Next visit planned for 1 week

## 2018-06-14 ENCOUNTER — Telehealth (HOSPITAL_COMMUNITY): Payer: Self-pay

## 2018-06-14 NOTE — Telephone Encounter (Signed)
I called Douglas [name] to see how he was doing. He stated he was feeling well and staying compliant with his medications. He stated he has started taking the hydralazine TID as opposed to BID like he was doing last week. He stated he was OK this week and we agreed to meet up sometime next week.

## 2018-06-22 ENCOUNTER — Other Ambulatory Visit: Payer: Self-pay

## 2018-06-22 ENCOUNTER — Telehealth (HOSPITAL_COMMUNITY): Payer: Self-pay | Admitting: Licensed Clinical Social Worker

## 2018-06-22 ENCOUNTER — Encounter (HOSPITAL_COMMUNITY): Payer: Self-pay | Admitting: Cardiology

## 2018-06-22 ENCOUNTER — Ambulatory Visit (HOSPITAL_COMMUNITY)
Admission: RE | Admit: 2018-06-22 | Discharge: 2018-06-22 | Disposition: A | Discharge status: Home / Self Care | Payer: Medicaid Other | Source: Ambulatory Visit | Attending: Adult Health | Admitting: Adult Health

## 2018-06-22 ENCOUNTER — Ambulatory Visit (HOSPITAL_BASED_OUTPATIENT_CLINIC_OR_DEPARTMENT_OTHER)
Admission: RE | Admit: 2018-06-22 | Discharge: 2018-06-22 | Disposition: A | Discharge status: Home / Self Care | Payer: Medicaid Other | Source: Ambulatory Visit | Attending: Cardiology | Admitting: Cardiology

## 2018-06-22 VITALS — BP 130/90 | HR 81 | Wt 260.0 lb

## 2018-06-22 DIAGNOSIS — I5032 Chronic diastolic (congestive) heart failure: Secondary | ICD-10-CM

## 2018-06-22 DIAGNOSIS — Z79899 Other long term (current) drug therapy: Secondary | ICD-10-CM | POA: Diagnosis not present

## 2018-06-22 DIAGNOSIS — Z809 Family history of malignant neoplasm, unspecified: Secondary | ICD-10-CM | POA: Insufficient documentation

## 2018-06-22 DIAGNOSIS — G4733 Obstructive sleep apnea (adult) (pediatric): Secondary | ICD-10-CM | POA: Diagnosis not present

## 2018-06-22 DIAGNOSIS — I110 Hypertensive heart disease with heart failure: Secondary | ICD-10-CM | POA: Insufficient documentation

## 2018-06-22 DIAGNOSIS — I10 Essential (primary) hypertension: Secondary | ICD-10-CM

## 2018-06-22 DIAGNOSIS — Z8249 Family history of ischemic heart disease and other diseases of the circulatory system: Secondary | ICD-10-CM | POA: Insufficient documentation

## 2018-06-22 DIAGNOSIS — Z87891 Personal history of nicotine dependence: Secondary | ICD-10-CM | POA: Insufficient documentation

## 2018-06-22 DIAGNOSIS — I2781 Cor pulmonale (chronic): Secondary | ICD-10-CM | POA: Diagnosis not present

## 2018-06-22 LAB — BASIC METABOLIC PANEL
Anion gap: 11 (ref 5–15)
BUN: 9 mg/dL (ref 6–20)
CO2: 36 mmol/L — ABNORMAL HIGH (ref 22–32)
Calcium: 9.1 mg/dL (ref 8.9–10.3)
Chloride: 92 mmol/L — ABNORMAL LOW (ref 98–111)
Creatinine, Ser: 1.12 mg/dL (ref 0.61–1.24)
GFR calc Af Amer: >60 mL/min (ref >60)
GFR calc non Af Amer: >60 mL/min (ref >60)
Glucose, Bld: 80 mg/dL (ref 70–99)
Potassium: 4.2 mmol/L (ref 3.5–5.1)
Sodium: 139 mmol/L (ref 135–145)

## 2018-06-22 LAB — BRAIN NATRIURETIC PEPTIDE: B Natriuretic Peptide: 307.1 pg/mL — ABNORMAL HIGH (ref 0.0–100.0)

## 2018-06-22 MED ORDER — HYDRALAZINE HCL 25 MG PO TABS: 25.0000 mg | ORAL_TABLET | Freq: Three times a day (TID) | ORAL | 1 refills | Status: DC

## 2018-06-22 NOTE — Patient Instructions (Signed)
Labs were done today. We will call you with any ABNORMAL results. No news is good news!  USE YOUR OXYGEN ALL THE TIME.  USE YOUR CPAP EVERY NIGHT.  INCREASE Hydralazine to 25 mg three times a day, this prescription has been mailed to your pharmacy.  Your physician recommends that you schedule a follow-up appointment in: 3 months.

## 2018-06-22 NOTE — Progress Notes (Signed)
  Echocardiogram 2D Echocardiogram has been performed.  Douglas [name] 06/22/2018, 1:52 PM

## 2018-06-22 NOTE — Telephone Encounter (Signed)
CSW reached out to pt to check in regarding food and medication status at this time. Pt reports he has everything he needs at this time.  Pt has been receiving [employer] and reports that since we reduced the number of meals he has been receiving it has been much better for him.  CSW encouraged pt to reach out with any concerns and will continue to follow and assist as needed  Jorge Ny, [place] Worker [facility] [phone]

## 2018-06-24 NOTE — Progress Notes (Signed)
Patient ID: Douglas Madura., male   DOB: [birth year], 33 y.o.   MRN: [id number]    Advanced Heart Failure Clinic Note   PCP: none.  Primary Cardiologist: Dr Aundra Dubin  Pulmonary: Dr Halford Chessman    HPI: [name] is a 33 yo with OHS/OSA, morbid obesity, HTN, diastolic CHF with prominent RV failure.   Admitted in August 2016  to [facility] with volume overload. Diuresed with lasix drip and diamox. Transitioned to lasix 80 mg twice a day. Discharge weight 257 pounds.   Admit to [facility] 3/21 through 04/28/15 with respiratory distress. Required short term intubation. Apparently had not been wearing BiPap. Discharge weight was 259 pounds.   He returns today for followup of RV failure. He wears his oxygen on and off but not all the time.  Oxygen saturation remains low when not using oxygen.  He wears CPAP at times but not regularly either.  His weight is down 11 lbs since last appointment.  No dyspnea walking on flat ground. No orthopnea/PND.  BP remains mildly elevated.   - ECHO 03/20/2014 EF 60-65% - ECHO 8/18: EF 55-60%, grade II diastolic dydsfunction, D-shaped septum suggesting RV pressure/volume overload, severe RV dilation with mild-moderately decreased RV systolic function, unable to estimate PA pressure.  - ECHO 5/20: EF 55-60%, moderate RV dilation with mildly decreased systolic function, unable to estimate PASP, D-shaped interventricular septum.   Review of systems complete and found to be negative unless listed in HPI.   SH:  Social History   Socioeconomic History  . Marital status: Single    Spouse name: Not on file  . Number of children: 2  . Years of education: Not on file  . Highest education level: Not on file  Occupational History  . Occupation: N/A    Employer: NOT EMPLOYED  Social Needs  . Financial resource strain: Not on file  . Food insecurity:    Worry: Not on file    Inability: Not on file  . Transportation needs:    Medical: Not on file    Non-medical: Not on file   Tobacco Use  . Smoking status: Former Smoker    Packs/day: 1.50    Years: 3.00    Pack years: 4.50    Types: Cigarettes    Last attempt to quit: 10/31/2013    Years since quitting: 4.6  . Smokeless tobacco: Never Used  Substance and Sexual Activity  . Alcohol use: No    Alcohol/week: 1.0 standard drinks    Types: 1 Cans of beer per week  . Drug use: No    Types: Cocaine    Comment: 10/31/2013 "might use cocaine once/month" - Has not used since 2015.   Marland Kitchen Sexual activity: Never  Lifestyle  . Physical activity:    Days per week: Not on file    Minutes per session: Not on file  . Stress: Not on file  Relationships  . Social connections:    Talks on phone: Not on file    Gets together: Not on file    Attends religious service: Not on file    Active member of club or organization: Not on file    Attends meetings of clubs or organizations: Not on file    Relationship status: Not on file  . Intimate partner violence:    Fear of current or ex partner: Not on file    Emotionally abused: Not on file    Physically abused: Not on file    Forced sexual activity:  Not on file  Other Topics Concern  . Not on file  Social History Narrative  . Not on file    FH:  Family History  Problem Relation Age of Onset  . Heart disease Mother        Pacemaker  . Hypertension Father   . Cancer Paternal Grandfather   . Heart attack Neg Hx   . Stroke Neg Hx     Past Medical History:  Diagnosis Date  . Arthritis    "hands, feet" (10/31/2013)  . CHF (congestive heart failure) ([place])   . Chronic bronchitis ([place])   . High cholesterol   . Hypertension   . Obesity   . OSA on CPAP   . Pneumonia 09/2011  . Substance abuse ([place]) 8/31/213   cociane "first time use"  . Tobacco abuse     Current Outpatient Medications  Medication Sig Dispense Refill  . albuterol (PROVENTIL) 2 MG tablet Take 2 mg by mouth 3 (three) times daily.    . carvedilol (COREG) 6.25 MG tablet TAKE 1 AND 1/2 TABLET BY  MOUTH TWICE A DAY 90 tablet 5  . hydrALAZINE (APRESOLINE) 25 MG tablet Take 1 tablet (25 mg total) by mouth 3 (three) times daily. 270 tablet 1  . losartan (COZAAR) 50 MG tablet TAKE ONE TABLET BY MOUTH TWICE DAILY 60 tablet 11  . NON FORMULARY 2L of continuous oxygen    . PROVENTIL HFA 108 (90 Base) MCG/ACT inhaler INHALE 1 TO 2 PUFFS BY MOUTH EVERY 6 (SIX) HOURS AS NEEDED FOR WHEEZING OR SHORTNESS OF BREATH 1 Inhaler 5  . torsemide (DEMADEX) 20 MG tablet TAKE 3 (THREE) TABLETS BY MOUTH TWICE DAILY 180 tablet 3  . metolazone (ZAROXOLYN) 2.5 MG tablet Take 1 tablet (2.5 mg total) by mouth as needed. (Patient not taking: Reported on 04/13/2018) 4 tablet 3   No current facility-administered medications for this encounter.     Vitals:   06/22/18 1357  BP: 130/90  Pulse: 81  SpO2: 93%  Weight: 117.9 kg (260 lb)   Wt Readings from Last 3 Encounters:  06/22/18 117.9 kg (260 lb)  06/07/18 117.3 kg (258 lb 9.6 oz)  05/18/18 120.8 kg (266 lb 4.8 oz)     PHYSICAL EXAM: General: NAD Neck: Thick, no JVD, no thyromegaly or thyroid nodule.  Lungs: Clear to auscultation bilaterally with normal respiratory effort. CV: Nondisplaced PMI.  Heart regular S1/S2, no S3/S4, no murmur.  No peripheral edema.  No carotid bruit.  Normal pedal pulses.  Abdomen: Soft, nontender, no hepatosplenomegaly, no distention.  Skin: Intact without lesions or rashes.  Neurologic: Alert and oriented x 3.  Psych: Normal affect. Extremities: No clubbing or cyanosis.  HEENT: Normal.    ASSESSMENT & PLAN:  1.Chronic diastolic CHF with prominent RV failure: Echo was done today and reviewed.  EF 55-60% with moderately dilated/mildly dysfunctional RV and D-shaped interventricular septum. RV failure is in the setting of OSA and OHS.  NYHA class II symptoms. He does not look volume overloaded on exam.  - Continue torsemide 60 mg twice a day, check BMET and BNP.  - Continue losartan 50 mg BID - Continue Coreg 9.375 mg BID 2.  OHS/OSA:  He does not always use his oxygen and his CPAP.  We had a long discussion about this, needs to wear oxygen at all times and use CPAP every night.  3. HTN:  BP still runs high.  - Increase hydralazine to 25 mg tid.   Followup in 3  months.   [name], MD 06/24/2018

## 2018-06-28 ENCOUNTER — Telehealth (HOSPITAL_COMMUNITY): Payer: Self-pay

## 2018-06-28 NOTE — Telephone Encounter (Signed)
I called Douglas [name] to schedule an appointment. He stated he was available tomorrow and we agreed to meet at 14:00.

## 2018-06-29 ENCOUNTER — Telehealth (HOSPITAL_COMMUNITY): Payer: Self-pay

## 2018-06-29 NOTE — Telephone Encounter (Signed)
Mr Evrard called me to advise that he was about to leave and would not be home for our 14:00 appointment. He stated he wanted to reschedule for next Tuesday. I will reach out then to set a time.

## 2018-07-03 ENCOUNTER — Telehealth (HOSPITAL_COMMUNITY): Payer: Self-pay

## 2018-07-03 NOTE — Telephone Encounter (Signed)
I called Douglas [name] to schedule an appointment. He stated he would be going out with his sister today and asked if I could come later in the week. We agreed to meet at 12:00 on Friday.

## 2018-07-06 ENCOUNTER — Telehealth (HOSPITAL_COMMUNITY): Payer: Self-pay

## 2018-07-06 NOTE — Telephone Encounter (Signed)
I called [name] after knocking on his door multiple times for our scheduled appointment. He did not answer so I left a message requesting he call me when he is able.

## 2018-07-24 ENCOUNTER — Other Ambulatory Visit (HOSPITAL_COMMUNITY): Payer: Self-pay | Admitting: Cardiology

## 2018-07-24 ENCOUNTER — Other Ambulatory Visit: Payer: Self-pay | Admitting: Pulmonary Disease

## 2018-07-25 ENCOUNTER — Telehealth (HOSPITAL_COMMUNITY): Payer: Self-pay

## 2018-07-25 NOTE — Telephone Encounter (Signed)
I called Douglas [name] to schedule a appointment. He stated he would be available tomorrow afternoon so we agreed to meet at 13:30.

## 2018-07-26 ENCOUNTER — Telehealth (HOSPITAL_COMMUNITY): Payer: Self-pay

## 2018-07-26 NOTE — Telephone Encounter (Signed)
I called [name] from his front door after knocking for several minutes. We spoke yesterday and had an appointment planned for today however [name] did not answer his door or phone. I left a message asking he return my call as soon as he is available.

## 2018-07-27 ENCOUNTER — Telehealth (HOSPITAL_COMMUNITY): Payer: Self-pay

## 2018-07-27 NOTE — Telephone Encounter (Signed)
Douglas [name] called me to advise he received my message from yesterday. He stated he had been asleep when I came yesterday but said he wanted to meet me next week. We agreed to meet at 13:00 on Tuesday.

## 2018-07-31 ENCOUNTER — Telehealth (HOSPITAL_COMMUNITY): Payer: Self-pay | Admitting: Licensed Clinical Social Worker

## 2018-07-31 ENCOUNTER — Other Ambulatory Visit (HOSPITAL_COMMUNITY): Payer: Self-pay

## 2018-07-31 ENCOUNTER — Telehealth (HOSPITAL_COMMUNITY): Payer: Self-pay

## 2018-07-31 NOTE — Telephone Encounter (Signed)
I returned a call from Douglas [name] who stated he called yesterday having forgotten that I do not work on Mondays. He was wanting confirmation of our appointment for today and asked that I bring batteries for his scale. I advised that I would see him at 13:00 as previously agreed.

## 2018-07-31 NOTE — Telephone Encounter (Signed)
Community paramedic requested help with obtaining letter explaining pt medical limitations and need for first-story apartment so he can provide to section 8 case worker and get moved into safer housing option.  CSW drafted letter and had physician review and sign- provided to community paramedic to get to patient.  CSW will continue to follow and assist as needed  Jorge Ny, [facility] Desk#: [phone] Cell#: [phone]

## 2018-07-31 NOTE — Progress Notes (Signed)
Paramedicine Encounter    Patient ID: Douglas Madura., male    DOB: [birth year], 33 y.o.   MRN: [id number]   Patient Care Team: Medicine, [facility] as PCP - General  Patient Active Problem List   Diagnosis Date Noted  . Morbid obesity ([place]) 06/17/2015  . Essential hypertension 04/14/2015  . Hyperkalemia 04/14/2015  . OSA (obstructive sleep apnea)   . Difficult intravenous access   . Chronic diastolic CHF (congestive heart failure), NYHA class 3 ([place]) 09/13/2014  . Cor pulmonale ([place]) 03/19/2014  . OSA treated with BiPAP     Current Outpatient Medications:  .  albuterol (PROVENTIL) 2 MG tablet, Take 2 mg by mouth 3 (three) times daily., Disp: , Rfl:  .  carvedilol (COREG) 6.25 MG tablet, TAKE 1 AND 1/2 TABLET BY MOUTH TWICE A DAY, Disp: 90 tablet, Rfl: 5 .  hydrALAZINE (APRESOLINE) 25 MG tablet, Take 1 tablet (25 mg total) by mouth 3 (three) times daily., Disp: 270 tablet, Rfl: 1 .  losartan (COZAAR) 50 MG tablet, TAKE ONE TABLET BY MOUTH TWICE DAILY, Disp: 60 tablet, Rfl: 11 .  NON FORMULARY, 2L of continuous oxygen, Disp: , Rfl:  .  PROVENTIL HFA 108 (90 Base) MCG/ACT inhaler, INHALE 1 TO 2 PUFFS BY MOUTH EVERY 6 (SIX) HOURS AS NEEDED FOR WHEEZING OR SHORTNESS OF BREATH, Disp: 6.7 g, Rfl: 0 .  torsemide (DEMADEX) 20 MG tablet, TAKE 3 (THREE) TABLETS BY MOUTH TWICE DAILY, Disp: 180 tablet, Rfl: 3 .  metolazone (ZAROXOLYN) 2.5 MG tablet, Take 1 tablet (2.5 mg total) by mouth as needed. (Patient not taking: Reported on 04/13/2018), Disp: 4 tablet, Rfl: 3 Allergies  Allergen Reactions  . Lisinopril Cough      Social History   Socioeconomic History  . Marital status: Single    Spouse name: Not on file  . Number of children: 2  . Years of education: Not on file  . Highest education level: Not on file  Occupational History  . Occupation: N/A    Employer: NOT EMPLOYED  Social Needs  . Financial resource strain: Not on file  . Food insecurity    Worry:  Not on file    Inability: Not on file  . Transportation needs    Medical: Not on file    Non-medical: Not on file  Tobacco Use  . Smoking status: Former Smoker    Packs/day: 1.50    Years: 3.00    Pack years: 4.50    Types: Cigarettes    Quit date: 10/31/2013    Years since quitting: 4.7  . Smokeless tobacco: Never Used  Substance and Sexual Activity  . Alcohol use: No    Alcohol/week: 1.0 standard drinks    Types: 1 Cans of beer per week  . Drug use: No    Types: Cocaine    Comment: 10/31/2013 "might use cocaine once/month" - Has not used since 2015.   Marland Kitchen Sexual activity: Never  Lifestyle  . Physical activity    Days per week: Not on file    Minutes per session: Not on file  . Stress: Not on file  Relationships  . Social Herbalist on phone: Not on file    Gets together: Not on file    Attends religious service: Not on file    Active member of club or organization: Not on file    Attends meetings of clubs or organizations: Not on file    Relationship status: Not  on file  . Intimate partner violence    Fear of current or ex partner: Not on file    Emotionally abused: Not on file    Physically abused: Not on file    Forced sexual activity: Not on file  Other Topics Concern  . Not on file  Social History Narrative  . Not on file    Physical Exam Cardiovascular:     Rate and Rhythm: Normal rate and regular rhythm.     Pulses: Normal pulses.  Pulmonary:     Effort: Pulmonary effort is normal.     Breath sounds: Normal breath sounds.  Musculoskeletal: Normal range of motion.     Right lower leg: No edema.     Left lower leg: No edema.  Skin:    General: Skin is warm and dry.     Capillary Refill: Capillary refill takes less than 2 seconds.  Neurological:     Mental Status: He is alert and oriented to person, place, and time.  Psychiatric:        Mood and Affect: Mood normal.         Future Appointments  Date Time Provider [place]   09/25/2018  2:40 PM Larey Dresser, MD [facility] None    BP 97/66 (BP Location: Left Arm, Patient Position: Sitting, Cuff Size: Normal)   Pulse 78   Resp 16   Wt 262 lb 12.8 oz (119.2 kg)   SpO2 95%   BMI 44.00 kg/m   Weight yesterday- n/a Last visit weight- 260 lb  Douglas [name] was seen at home today and reported feeling well. He denied chest pain, SOB, headache, dizziness, orthopnea, cough of fever over the past week. He stated he has been compliant with his medications over the past week and his weight has been stable. His medications were verified and necessary refills were ordered. He requested a letter for his housing authority so he can get an apartment without steps so I will reach out to [place], LCSW, and have her handle this. I will follow up in two weeks.   [name], EMT 07/31/18  ACTION: Home visit completed Next visit planned for 2 weeks

## 2018-08-03 ENCOUNTER — Telehealth (HOSPITAL_COMMUNITY): Payer: Self-pay | Admitting: Licensed Clinical Social Worker

## 2018-08-03 NOTE — Telephone Encounter (Signed)
CSW contacted patient to follow up on [facility] and supportive needs. Message left for return call. Jackie Maleki Hippe, LCSW, CCSW-MCS [phone] 

## 2018-08-09 ENCOUNTER — Telehealth (HOSPITAL_COMMUNITY): Payer: Self-pay

## 2018-08-09 NOTE — Telephone Encounter (Signed)
I called Douglas [name] today to schedule an appointment. He stated he would be available tomorrow but was not sure if he would be home or at his mom's house. I advised I would reach out around 13:00 to firm up a location for [place] to meet. He was understanding and agreeable

## 2018-08-10 ENCOUNTER — Other Ambulatory Visit (HOSPITAL_COMMUNITY): Payer: Self-pay

## 2018-08-10 NOTE — Progress Notes (Signed)
Paramedicine Encounter    Patient ID: Douglas [name]., male    DOB: 30-May-1985, 33 y.o.   MRN: [id number]   Patient Care Team: Medicine, [facility] as PCP - General  Patient Active Problem List   Diagnosis Date Noted  . Morbid obesity ([place]) 06/17/2015  . Essential hypertension 04/14/2015  . Hyperkalemia 04/14/2015  . OSA (obstructive sleep apnea)   . Difficult intravenous access   . Chronic diastolic CHF (congestive heart failure), NYHA class 3 ([place]) 09/13/2014  . Cor pulmonale ([place]) 03/19/2014  . OSA treated with BiPAP     Current Outpatient Medications:  .  albuterol (PROVENTIL) 2 MG tablet, Take 2 mg by mouth 3 (three) times daily., Disp: , Rfl:  .  carvedilol (COREG) 6.25 MG tablet, TAKE 1 AND 1/2 TABLET BY MOUTH TWICE A DAY, Disp: 90 tablet, Rfl: 5 .  hydrALAZINE (APRESOLINE) 25 MG tablet, Take 1 tablet (25 mg total) by mouth 3 (three) times daily., Disp: 270 tablet, Rfl: 1 .  losartan (COZAAR) 50 MG tablet, TAKE ONE TABLET BY MOUTH TWICE DAILY, Disp: 60 tablet, Rfl: 11 .  NON FORMULARY, 2L of continuous oxygen, Disp: , Rfl:  .  PROVENTIL HFA 108 (90 Base) MCG/ACT inhaler, INHALE 1 TO 2 PUFFS BY MOUTH EVERY 6 (SIX) HOURS AS NEEDED FOR WHEEZING OR SHORTNESS OF BREATH, Disp: 6.7 g, Rfl: 0 .  torsemide (DEMADEX) 20 MG tablet, TAKE 3 (THREE) TABLETS BY MOUTH TWICE DAILY, Disp: 180 tablet, Rfl: 3 .  metolazone (ZAROXOLYN) 2.5 MG tablet, Take 1 tablet (2.5 mg total) by mouth as needed. (Patient not taking: Reported on 04/13/2018), Disp: 4 tablet, Rfl: 3 Allergies  Allergen Reactions  . Lisinopril Cough      Social History   Socioeconomic History  . Marital status: Single    Spouse name: Not on file  . Number of children: 2  . Years of education: Not on file  . Highest education level: Not on file  Occupational History  . Occupation: N/A    Employer: NOT EMPLOYED  Social Needs  . Financial resource strain: Not on file  . Food insecurity    Worry:  Not on file    Inability: Not on file  . Transportation needs    Medical: Not on file    Non-medical: Not on file  Tobacco Use  . Smoking status: Former Smoker    Packs/day: 1.50    Years: 3.00    Pack years: 4.50    Types: Cigarettes    Quit date: 10/31/2013    Years since quitting: 4.7  . Smokeless tobacco: Never Used  Substance and Sexual Activity  . Alcohol use: No    Alcohol/week: 1.0 standard drinks    Types: 1 Cans of beer per week  . Drug use: No    Types: Cocaine    Comment: 10/31/2013 "might use cocaine once/month" - Has not used since 2015.   Marland Kitchen Sexual activity: Never  Lifestyle  . Physical activity    Days per week: Not on file    Minutes per session: Not on file  . Stress: Not on file  Relationships  . Social Herbalist on phone: Not on file    Gets together: Not on file    Attends religious service: Not on file    Active member of club or organization: Not on file    Attends meetings of clubs or organizations: Not on file    Relationship status: Not  on file  . Intimate partner violence    Fear of current or ex partner: Not on file    Emotionally abused: Not on file    Physically abused: Not on file    Forced sexual activity: Not on file  Other Topics Concern  . Not on file  Social History Narrative  . Not on file    Physical Exam Cardiovascular:     Rate and Rhythm: Normal rate and regular rhythm.     Pulses: Normal pulses.  Pulmonary:     Effort: Pulmonary effort is normal.     Breath sounds: Normal breath sounds.  Abdominal:     General: There is no distension.  Musculoskeletal: Normal range of motion.     Right lower leg: No edema.     Left lower leg: No edema.  Skin:    General: Skin is warm and dry.     Capillary Refill: Capillary refill takes less than 2 seconds.  Neurological:     Mental Status: He is alert and oriented to person, place, and time.  Psychiatric:        Mood and Affect: Mood normal.         Future  Appointments  Date Time Provider [place]  09/25/2018  2:40 PM Larey Dresser, MD [facility] None    BP 113/74 (BP Location: Left Arm, Patient Position: Sitting, Cuff Size: Normal)   Pulse 91   Resp 18   Wt 258 lb 12.8 oz (117.4 kg)   SpO2 93%   BMI 43.33 kg/m   Weight yesterday- Did not weigh Last visit weight- 262 lb  Mr Jarmon was see at home today and reported feeling well. He denied chest pain, SOB, headache, dizziness, orthopnea, cough or fever since our last visit. He reported being compliant with his medication over the past week and his weight continues to trend down. His medications were verified and he does not have any other concerns at this time. I will follow up in two to three weeks.   [name], EMT 08/10/18  ACTION: Home visit completed Next visit planned for 2-3 weeks

## 2018-08-21 ENCOUNTER — Telehealth (HOSPITAL_COMMUNITY): Payer: Self-pay

## 2018-08-21 NOTE — Telephone Encounter (Signed)
I called Douglas [name] to schedule an appointment. He stated he would be available on Thursday so we agreed to meet at 11:00.

## 2018-08-23 ENCOUNTER — Other Ambulatory Visit (HOSPITAL_COMMUNITY): Payer: Self-pay

## 2018-08-23 NOTE — Progress Notes (Signed)
Paramedicine Encounter    Patient ID: Douglas Madura., male    DOB: [birth year], 33 y.o.   MRN: [id number]   Patient Care Team: Medicine, [facility] as PCP - General  Patient Active Problem List   Diagnosis Date Noted  . Morbid obesity ([place]) 06/17/2015  . Essential hypertension 04/14/2015  . Hyperkalemia 04/14/2015  . OSA (obstructive sleep apnea)   . Difficult intravenous access   . Chronic diastolic CHF (congestive heart failure), NYHA class 3 ([place]) 09/13/2014  . Cor pulmonale ([place]) 03/19/2014  . OSA treated with BiPAP     Current Outpatient Medications:  .  albuterol (PROVENTIL) 2 MG tablet, Take 2 mg by mouth 3 (three) times daily., Disp: , Rfl:  .  carvedilol (COREG) 6.25 MG tablet, TAKE 1 AND 1/2 TABLET BY MOUTH TWICE A DAY, Disp: 90 tablet, Rfl: 5 .  hydrALAZINE (APRESOLINE) 25 MG tablet, Take 1 tablet (25 mg total) by mouth 3 (three) times daily., Disp: 270 tablet, Rfl: 1 .  losartan (COZAAR) 50 MG tablet, TAKE ONE TABLET BY MOUTH TWICE DAILY, Disp: 60 tablet, Rfl: 11 .  NON FORMULARY, 2L of continuous oxygen, Disp: , Rfl:  .  PROVENTIL HFA 108 (90 Base) MCG/ACT inhaler, INHALE 1 TO 2 PUFFS BY MOUTH EVERY 6 (SIX) HOURS AS NEEDED FOR WHEEZING OR SHORTNESS OF BREATH, Disp: 6.7 g, Rfl: 0 .  torsemide (DEMADEX) 20 MG tablet, TAKE 3 (THREE) TABLETS BY MOUTH TWICE DAILY, Disp: 180 tablet, Rfl: 3 .  metolazone (ZAROXOLYN) 2.5 MG tablet, Take 1 tablet (2.5 mg total) by mouth as needed. (Patient not taking: Reported on 04/13/2018), Disp: 4 tablet, Rfl: 3 Allergies  Allergen Reactions  . Lisinopril Cough      Social History   Socioeconomic History  . Marital status: Single    Spouse name: Not on file  . Number of children: 2  . Years of education: Not on file  . Highest education level: Not on file  Occupational History  . Occupation: N/A    Employer: NOT EMPLOYED  Social Needs  . Financial resource strain: Not on file  . Food insecurity    Worry:  Not on file    Inability: Not on file  . Transportation needs    Medical: Not on file    Non-medical: Not on file  Tobacco Use  . Smoking status: Former Smoker    Packs/day: 1.50    Years: 3.00    Pack years: 4.50    Types: Cigarettes    Quit date: 10/31/2013    Years since quitting: 4.8  . Smokeless tobacco: Never Used  Substance and Sexual Activity  . Alcohol use: No    Alcohol/week: 1.0 standard drinks    Types: 1 Cans of beer per week  . Drug use: No    Types: Cocaine    Comment: 10/31/2013 "might use cocaine once/month" - Has not used since 2015.   Marland Kitchen Sexual activity: Never  Lifestyle  . Physical activity    Days per week: Not on file    Minutes per session: Not on file  . Stress: Not on file  Relationships  . Social Herbalist on phone: Not on file    Gets together: Not on file    Attends religious service: Not on file    Active member of club or organization: Not on file    Attends meetings of clubs or organizations: Not on file    Relationship status: Not  on file  . Intimate partner violence    Fear of current or ex partner: Not on file    Emotionally abused: Not on file    Physically abused: Not on file    Forced sexual activity: Not on file  Other Topics Concern  . Not on file  Social History Narrative  . Not on file    Physical Exam Cardiovascular:     Rate and Rhythm: Normal rate and regular rhythm.     Pulses: Normal pulses.  Pulmonary:     Effort: Pulmonary effort is normal.     Breath sounds: Normal breath sounds.  Musculoskeletal: Normal range of motion.     Right lower leg: No edema.     Left lower leg: No edema.  Skin:    General: Skin is warm and dry.     Capillary Refill: Capillary refill takes less than 2 seconds.  Neurological:     Mental Status: He is alert and oriented to person, place, and time.  Psychiatric:        Mood and Affect: Mood normal.         Future Appointments  Date Time Provider [place]   09/25/2018  2:40 PM Larey Dresser, MD [facility] None    BP (!) 135/96 (BP Location: Left Arm, Patient Position: Sitting, Cuff Size: Normal)   Pulse 78   Resp 20   Wt 265 lb 11.2 oz (120.5 kg)   SpO2 94%   BMI 44.49 kg/m   Weight yesterday- 261 lb Last visit weight- 258 lb  Douglas [name] was seen at home today and reported feeling well. He denied chest pain, SOB, headache, dizziness, orthopnea, cough or fever since our last visit. He stated he has been mostly compliant with his medications but admits to intermittently missing doses, including last night and his vitals this morning reflected the same. His medications were verified and he said he wants to continue taking medications without the pillbox but agreed to start using it again if he continues to miss doses. I will follow up next week to ensure his vital signs and weight has improved.   [name], EMT 08/23/18  ACTION: Home visit completed Next visit planned for 1 week

## 2018-08-30 ENCOUNTER — Telehealth (HOSPITAL_COMMUNITY): Payer: Self-pay

## 2018-08-30 NOTE — Telephone Encounter (Signed)
I called Douglas [name] to schedule an appointment. He stated he would be available tomorrow in the afternoon so we agreed to meet at 12:00.

## 2018-08-31 ENCOUNTER — Other Ambulatory Visit (HOSPITAL_COMMUNITY): Payer: Self-pay

## 2018-08-31 NOTE — Progress Notes (Signed)
Paramedicine Encounter    Patient ID: Douglas [name]., male    DOB: [birth year], 33 y.o.   MRN: [id number]   Patient Care Team: Medicine, [facility] as PCP - General  Patient Active Problem List   Diagnosis Date Noted  . Morbid obesity ([place]) 06/17/2015  . Essential hypertension 04/14/2015  . Hyperkalemia 04/14/2015  . OSA (obstructive sleep apnea)   . Difficult intravenous access   . Chronic diastolic CHF (congestive heart failure), NYHA class 3 ([place]) 09/13/2014  . Cor pulmonale ([place]) 03/19/2014  . OSA treated with BiPAP     Current Outpatient Medications:  .  albuterol (PROVENTIL) 2 MG tablet, Take 2 mg by mouth 3 (three) times daily., Disp: , Rfl:  .  carvedilol (COREG) 6.25 MG tablet, TAKE 1 AND 1/2 TABLET BY MOUTH TWICE A DAY, Disp: 90 tablet, Rfl: 5 .  hydrALAZINE (APRESOLINE) 25 MG tablet, Take 1 tablet (25 mg total) by mouth 3 (three) times daily., Disp: 270 tablet, Rfl: 1 .  losartan (COZAAR) 50 MG tablet, TAKE ONE TABLET BY MOUTH TWICE DAILY, Disp: 60 tablet, Rfl: 11 .  NON FORMULARY, 2L of continuous oxygen, Disp: , Rfl:  .  PROVENTIL HFA 108 (90 Base) MCG/ACT inhaler, INHALE 1 TO 2 PUFFS BY MOUTH EVERY 6 (SIX) HOURS AS NEEDED FOR WHEEZING OR SHORTNESS OF BREATH, Disp: 6.7 g, Rfl: 0 .  torsemide (DEMADEX) 20 MG tablet, TAKE 3 (THREE) TABLETS BY MOUTH TWICE DAILY, Disp: 180 tablet, Rfl: 3 .  metolazone (ZAROXOLYN) 2.5 MG tablet, Take 1 tablet (2.5 mg total) by mouth as needed. (Patient not taking: Reported on 04/13/2018), Disp: 4 tablet, Rfl: 3 Allergies  Allergen Reactions  . Lisinopril Cough      Social History   Socioeconomic History  . Marital status: Single    Spouse name: Not on file  . Number of children: 2  . Years of education: Not on file  . Highest education level: Not on file  Occupational History  . Occupation: N/A    Employer: NOT EMPLOYED  Social Needs  . Financial resource strain: Not on file  . Food insecurity    Worry:  Not on file    Inability: Not on file  . Transportation needs    Medical: Not on file    Non-medical: Not on file  Tobacco Use  . Smoking status: Former Smoker    Packs/day: 1.50    Years: 3.00    Pack years: 4.50    Types: Cigarettes    Quit date: 10/31/2013    Years since quitting: 4.8  . Smokeless tobacco: Never Used  Substance and Sexual Activity  . Alcohol use: No    Alcohol/week: 1.0 standard drinks    Types: 1 Cans of beer per week  . Drug use: No    Types: Cocaine    Comment: 10/31/2013 "might use cocaine once/month" - Has not used since 2015.   Marland Kitchen Sexual activity: Never  Lifestyle  . Physical activity    Days per week: Not on file    Minutes per session: Not on file  . Stress: Not on file  Relationships  . Social Herbalist on phone: Not on file    Gets together: Not on file    Attends religious service: Not on file    Active member of club or organization: Not on file    Attends meetings of clubs or organizations: Not on file    Relationship status: Not  on file  . Intimate partner violence    Fear of current or ex partner: Not on file    Emotionally abused: Not on file    Physically abused: Not on file    Forced sexual activity: Not on file  Other Topics Concern  . Not on file  Social History Narrative  . Not on file    Physical Exam Cardiovascular:     Rate and Rhythm: Normal rate and regular rhythm.     Pulses: Normal pulses.  Pulmonary:     Effort: Pulmonary effort is normal.     Breath sounds: Normal breath sounds.  Abdominal:     General: There is no distension.  Musculoskeletal: Normal range of motion.     Right lower leg: No edema.     Left lower leg: No edema.  Skin:    General: Skin is warm and dry.     Capillary Refill: Capillary refill takes less than 2 seconds.  Neurological:     Mental Status: He is alert and oriented to person, place, and time.  Psychiatric:        Mood and Affect: Mood normal.         Future  Appointments  Date Time Provider [place]  09/25/2018  2:40 PM Larey Dresser, MD [facility] None    BP (!) 152/100 (BP Location: Left Arm, Patient Position: Sitting, Cuff Size: Normal)   Pulse 77   Resp 18   Wt 258 lb 12.8 oz (117.4 kg)   SpO2 93%   BMI 43.33 kg/m   Weight yesterday- 260 lb Last visit weight- 265 lb   [name] was seen at home toda and reported feeling well. He denied chest pain, SOB, headache, dizziness, orthopnea, cough or fever. He stated he has been compliant with his medications and his weight has been trending down. He had not taken his medications yet today because he had just gotten out of bed when I arrived. His medications were verified and no changes were necessary at this time. I will follow up in two weeks.  Douglas [name], EMT 08/31/18  ACTION: Home visit completed Next visit planned for 2 weeks

## 2018-09-06 ENCOUNTER — Other Ambulatory Visit (HOSPITAL_COMMUNITY): Payer: Self-pay

## 2018-09-06 ENCOUNTER — Telehealth (HOSPITAL_COMMUNITY): Payer: Self-pay

## 2018-09-06 NOTE — Telephone Encounter (Signed)
I called Douglas [name] again to schedule an appointment. He still did not answer so I left a voicemail requesting he call me back.

## 2018-09-06 NOTE — Progress Notes (Signed)
I called Douglas [name] to schedule an appointment. He did not answer so I left a voicemail requesting he call me back.

## 2018-09-21 ENCOUNTER — Telehealth (HOSPITAL_COMMUNITY): Payer: Self-pay

## 2018-09-21 NOTE — Telephone Encounter (Signed)
I called Douglas [name] to schedule a visit. He stated he was out and running errands today but asked that I confirm his appointment for next week. I confirmed that he is scheduled for next week at 14:40. I advised him to bring his medications. He was understanding and agreeable.

## 2018-09-25 ENCOUNTER — Encounter (HOSPITAL_COMMUNITY): Payer: Medicaid Other | Admitting: Cardiology

## 2018-09-25 DIAGNOSIS — I639 Cerebral infarction, unspecified: Secondary | ICD-10-CM

## 2018-09-25 HISTORY — DX: Cerebral infarction, unspecified: I63.9

## 2018-09-26 ENCOUNTER — Other Ambulatory Visit (HOSPITAL_COMMUNITY): Payer: Self-pay | Admitting: Cardiology

## 2018-09-26 ENCOUNTER — Telehealth (HOSPITAL_COMMUNITY): Payer: Self-pay | Admitting: Licensed Clinical Social Worker

## 2018-09-26 DIAGNOSIS — I5032 Chronic diastolic (congestive) heart failure: Secondary | ICD-10-CM

## 2018-09-26 NOTE — Telephone Encounter (Signed)
CSW reached out to pt to check in regarding food and medication status at this time. Pt states he has everything that he needs.  CSW inquired about pt progress with trying to move to a first story apartment that would be easier for him to access- pt states he has reached out to [employer] case worker but that he is awaiting a return call.  Encouraged pt to reach out if [employer] needed any further documentation from [place].  CSW  will continue to follow and assist as needed  Jorge Ny, LCSW Clinical Social Worker [facility] [phone]

## 2018-09-27 ENCOUNTER — Telehealth (HOSPITAL_COMMUNITY): Payer: Self-pay

## 2018-09-27 NOTE — Telephone Encounter (Signed)
I called Douglas [name] to schedule an appointment. He did not answer so I left a message requesting he call me back.  

## 2018-10-03 ENCOUNTER — Telehealth (HOSPITAL_COMMUNITY): Payer: Self-pay

## 2018-10-03 NOTE — Telephone Encounter (Signed)
I called [name] after having missed a call from him earlier in the day. He stated he was concerned because his weight was elevated and he was "feeling heavy." He said his weight has increased to 271 lb and he has been drinking too much. I confirmed that he has been taking his medications correctly, specifically torsemide. I advised I could be by tomorrow at 14:30 and he was agreeable. While I was on the phone with him we spoke about him canceling his clinic appointment last week and he said he had to "get some things done" but knew he should have kept his appointment. I stressed the importance of him keeping appointments and the correlation between that and his overall health and he expressed understanding. I will follow up tomorrow.

## 2018-10-04 ENCOUNTER — Other Ambulatory Visit (HOSPITAL_COMMUNITY): Payer: Self-pay

## 2018-10-04 NOTE — Progress Notes (Signed)
Paramedicine Encounter    Patient ID: Douglas [name]., male    DOB: [birth year], 33 y.o.   MRN: [id number]   Patient Care Team: Medicine, [facility] as PCP - General  Patient Active Problem List   Diagnosis Date Noted  . Morbid obesity ([place]) 06/17/2015  . Essential hypertension 04/14/2015  . Hyperkalemia 04/14/2015  . OSA (obstructive sleep apnea)   . Difficult intravenous access   . Chronic diastolic CHF (congestive heart failure), NYHA class 3 ([place]) 09/13/2014  . Cor pulmonale ([place]) 03/19/2014  . OSA treated with BiPAP     Current Outpatient Medications:  .  albuterol (PROVENTIL) 2 MG tablet, Take 2 mg by mouth 3 (three) times daily., Disp: , Rfl:  .  carvedilol (COREG) 6.25 MG tablet, TAKE 1 AND 1/2 TABLET BY MOUTH TWICE A DAY, Disp: 90 tablet, Rfl: 5 .  hydrALAZINE (APRESOLINE) 25 MG tablet, Take 1 tablet (25 mg total) by mouth 3 (three) times daily., Disp: 270 tablet, Rfl: 1 .  losartan (COZAAR) 50 MG tablet, TAKE 1 TABLET ( 50 MG TOTAL) BY MOUTH TWICE A DAY, Disp: 180 tablet, Rfl: 3 .  NON FORMULARY, 2L of continuous oxygen, Disp: , Rfl:  .  PROVENTIL HFA 108 (90 Base) MCG/ACT inhaler, INHALE 1 TO 2 PUFFS BY MOUTH EVERY 6 (SIX) HOURS AS NEEDED FOR WHEEZING OR SHORTNESS OF BREATH, Disp: 6.7 g, Rfl: 0 .  torsemide (DEMADEX) 20 MG tablet, TAKE 3 (THREE) TABLETS BY MOUTH TWICE DAILY, Disp: 180 tablet, Rfl: 3 .  metolazone (ZAROXOLYN) 2.5 MG tablet, Take 1 tablet (2.5 mg total) by mouth as needed. (Patient not taking: Reported on 04/13/2018), Disp: 4 tablet, Rfl: 3 Allergies  Allergen Reactions  . Lisinopril Cough      Social History   Socioeconomic History  . Marital status: Single    Spouse name: Not on file  . Number of children: 2  . Years of education: Not on file  . Highest education level: Not on file  Occupational History  . Occupation: N/A    Employer: NOT EMPLOYED  Social Needs  . Financial resource strain: Not on file  . Food insecurity     Worry: Not on file    Inability: Not on file  . Transportation needs    Medical: Not on file    Non-medical: Not on file  Tobacco Use  . Smoking status: Former Smoker    Packs/day: 1.50    Years: 3.00    Pack years: 4.50    Types: Cigarettes    Quit date: 10/31/2013    Years since quitting: 4.9  . Smokeless tobacco: Never Used  Substance and Sexual Activity  . Alcohol use: No    Alcohol/week: 1.0 standard drinks    Types: 1 Cans of beer per week  . Drug use: No    Types: Cocaine    Comment: 10/31/2013 "might use cocaine once/month" - Has not used since 2015.   Marland Kitchen Sexual activity: Never  Lifestyle  . Physical activity    Days per week: Not on file    Minutes per session: Not on file  . Stress: Not on file  Relationships  . Social Herbalist on phone: Not on file    Gets together: Not on file    Attends religious service: Not on file    Active member of club or organization: Not on file    Attends meetings of clubs or organizations: Not on file  Relationship status: Not on file  . Intimate partner violence    Fear of current or ex partner: Not on file    Emotionally abused: Not on file    Physically abused: Not on file    Forced sexual activity: Not on file  Other Topics Concern  . Not on file  Social History Narrative  . Not on file    Physical Exam Cardiovascular:     Rate and Rhythm: Normal rate and regular rhythm.     Pulses: Normal pulses.  Pulmonary:     Effort: Pulmonary effort is normal.     Breath sounds: Normal breath sounds.  Musculoskeletal: Normal range of motion.     Right lower leg: No edema.     Left lower leg: No edema.  Skin:    General: Skin is warm and dry.     Capillary Refill: Capillary refill takes less than 2 seconds.  Neurological:     Mental Status: He is alert and oriented to person, place, and time.  Psychiatric:        Mood and Affect: Mood normal.         Future Appointments  Date Time Provider [place]  11/23/2018  2:40 PM Larey Dresser, MD [facility] None    BP 118/77 (BP Location: Right Arm, Patient Position: Sitting, Cuff Size: Normal)   Pulse 74   Resp 18   Wt 268 lb 11.2 oz (121.9 kg)   SpO2 95%   BMI 44.99 kg/m   Weight yesterday- 271 lb Last visit weight- 258.8 lb  Douglas [name] was seen at home today and reported feeling like he has gained weight. He denied chest pain, SOB, headache, dizziness, orthopnea, fever or cough since our last visit. He stated he has been compliant but his weight has been going up for the past several weeks. He stated he believes he is drinking too much and said he has gotten away from drinking water and is drinking mostly juices. I explained that juices typically have high sugar content and could be responsible for his weight gain. He stated he would change back to drinking strictly water and limiting his intake as well. He may be losing his CPAP because he has not been wearing it. When I asked why he said "the air hurts [his] ear." I explained the importance of wearing it and he expressed understanding. His medications were verified he is not in need of any refills. I will follow up next week to see if his weight is trending down.   Douglas [name], EMT 10/04/18  ACTION: Home visit completed Next visit planned for 1 week

## 2018-10-09 ENCOUNTER — Emergency Department (HOSPITAL_COMMUNITY): Payer: Medicaid Other

## 2018-10-09 ENCOUNTER — Emergency Department (HOSPITAL_COMMUNITY)
Admission: EM | Admit: 2018-10-09 | Discharge: 2018-10-10 | Disposition: A | Discharge status: Home / Self Care | Payer: Medicaid Other | Attending: Emergency Medicine | Admitting: Emergency Medicine

## 2018-10-09 ENCOUNTER — Encounter (HOSPITAL_COMMUNITY): Payer: Self-pay | Admitting: *Deleted

## 2018-10-09 ENCOUNTER — Telehealth (HOSPITAL_COMMUNITY): Payer: Self-pay

## 2018-10-09 DIAGNOSIS — I110 Hypertensive heart disease with heart failure: Secondary | ICD-10-CM | POA: Insufficient documentation

## 2018-10-09 DIAGNOSIS — Z87891 Personal history of nicotine dependence: Secondary | ICD-10-CM | POA: Diagnosis not present

## 2018-10-09 DIAGNOSIS — I509 Heart failure, unspecified: Secondary | ICD-10-CM

## 2018-10-09 DIAGNOSIS — Z79899 Other long term (current) drug therapy: Secondary | ICD-10-CM | POA: Diagnosis not present

## 2018-10-09 DIAGNOSIS — Z9981 Dependence on supplemental oxygen: Secondary | ICD-10-CM | POA: Diagnosis not present

## 2018-10-09 DIAGNOSIS — E875 Hyperkalemia: Secondary | ICD-10-CM | POA: Diagnosis not present

## 2018-10-09 DIAGNOSIS — Z888 Allergy status to other drugs, medicaments and biological substances status: Secondary | ICD-10-CM | POA: Diagnosis not present

## 2018-10-09 DIAGNOSIS — I5032 Chronic diastolic (congestive) heart failure: Secondary | ICD-10-CM | POA: Diagnosis not present

## 2018-10-09 DIAGNOSIS — E782 Mixed hyperlipidemia: Secondary | ICD-10-CM | POA: Diagnosis not present

## 2018-10-09 DIAGNOSIS — R0602 Shortness of breath: Secondary | ICD-10-CM | POA: Diagnosis present

## 2018-10-09 DIAGNOSIS — Z789 Other specified health status: Secondary | ICD-10-CM

## 2018-10-09 LAB — CBG MONITORING, ED: Glucose-Capillary: 138 mg/dL — ABNORMAL HIGH (ref 70–99)

## 2018-10-09 LAB — CBC
HCT: 64.4 % — ABNORMAL HIGH (ref 39.0–52.0)
Hemoglobin: 18.4 g/dL — ABNORMAL HIGH (ref 13.0–17.0)
MCH: 28.9 pg (ref 26.0–34.0)
MCHC: 28.6 g/dL — ABNORMAL LOW (ref 30.0–36.0)
MCV: 101.1 fL — ABNORMAL HIGH (ref 80.0–100.0)
Platelets: 148 10*3/uL — ABNORMAL LOW (ref 150–400)
RBC: 6.37 MIL/uL — ABNORMAL HIGH (ref 4.22–5.81)
RDW: 16.9 % — ABNORMAL HIGH (ref 11.5–15.5)
WBC: 8 10*3/uL (ref 4.0–10.5)
nRBC: 0 % (ref 0.0–0.2)

## 2018-10-09 LAB — TROPONIN I (HIGH SENSITIVITY)
Troponin I (High Sensitivity): 19 ng/L — ABNORMAL HIGH (ref <18)
Troponin I (High Sensitivity): 20 ng/L — ABNORMAL HIGH (ref <18)

## 2018-10-09 LAB — BASIC METABOLIC PANEL
Anion gap: 12 (ref 5–15)
BUN: 50 mg/dL — ABNORMAL HIGH (ref 6–20)
CO2: 42 mmol/L — ABNORMAL HIGH (ref 22–32)
Calcium: 9.3 mg/dL (ref 8.9–10.3)
Chloride: 83 mmol/L — ABNORMAL LOW (ref 98–111)
Creatinine, Ser: 2.01 mg/dL — ABNORMAL HIGH (ref 0.61–1.24)
GFR calc Af Amer: 48 mL/min — ABNORMAL LOW (ref >60)
GFR calc non Af Amer: 42 mL/min — ABNORMAL LOW (ref >60)
Glucose, Bld: 65 mg/dL — ABNORMAL LOW (ref 70–99)
Potassium: 5.9 mmol/L — ABNORMAL HIGH (ref 3.5–5.1)
Sodium: 137 mmol/L (ref 135–145)

## 2018-10-09 LAB — BRAIN NATRIURETIC PEPTIDE: B Natriuretic Peptide: 392.2 pg/mL — ABNORMAL HIGH (ref 0.0–100.0)

## 2018-10-09 MED ORDER — SODIUM CHLORIDE 0.9% FLUSH: 3.0000 mL | Freq: Once | INTRAVENOUS | Status: DC

## 2018-10-09 NOTE — Telephone Encounter (Signed)
Douglas [name] called me to advise he was not feeling well and wanted me to come to his grandmother's house to evaluate him. He was nondescript with his symptoms but his sister got on the phone and said he was having a hard time staying awake and he seemed short of breath. Upon arriving at his grandmother's house, Douglas [name] was sitting in the back seat of his sister's car completely alert and oriented. He stated he was feeling tired and was worried he had "too much fluid" on him. After a few minutes of talking I noted that he was not wearing his nasal canula from his portable oxygen condenser. He stated that it was "messing up yesterday" and he did not charge it last night so the battery had died. I check his oxygen saturation and noted him to be in the mid to low 80's. I advised this was likely the reason for his feeling unwell and told him he should never leave the house without a charged battery or some other way to have continuous supplemental oxygen. As a solution I suggested he return home and plug in his condenser and let it charge. He was understanding but seemed to think there was something more going on so I asked if he thought he should be evaluated at the ED. His family quickly began urging him to go to the hospital to be checked out further and he agreed. I suggested that since he was already in his sister's car it would be quickest for her to drive him to the ED given the proximity to the hospital. While in transit, I contacted Douglas Grinder, Douglas [name], at the [facility] clinic and advised her of the situation. She stated they were unable to work him into an office visit today so if he needs to be seen the ED was the best option. I relayed this to Douglas [name] and his sister who had since arrived at the ED. I will follow up with Douglas [name] tomorrow to see if any changes were made in the ED.   Douglas [name], EMT 10/09/18

## 2018-10-09 NOTE — ED Triage Notes (Signed)
Pt in c/o SOB today while traveling with low battery on his portable oxygen, pt arrived to ED with O2 sats in upper 70%, pt placed on [place] 5 L with O2sats at 100%, pt then had O2 put on 2 L and O2 sats remained at 100%, this RN spoke with heart failure EMT who comes to his home who provided the same report, pt A&O x4, speaks in full sentences at this time, pt takes diuretic for CHF

## 2018-10-09 NOTE — ED Provider Notes (Signed)
[facility] EMERGENCY DEPARTMENT Provider Note   CSN: [id number] Arrival date & time: 10/09/18  1359     History   Chief Complaint Chief Complaint  Patient presents with   Shortness of Breath    HPI Douglas [name]. is a 33 y.o. male with a past medical history of CHF on 2 L of oxygen by nasal cannula at all times, hypertension, obesity, OSA who presents to ED for evaluation of shortness of breath.  States that he was with his sister outside and forgot to charge his oxygen tank.  He states that he is usually careful with remembering to charge the tank and bring the appropriate charging tools with him.  Patient arrived with oxygen saturations at 70%.  He was placed on his usual 2 L of oxygen improved to 100%.  States that his symptoms have completely resolved.  He denies any chest pain, cough, leg swelling, fever, sick contacts or similar symptoms, vomiting, injuries or falls.  He reports compliance with his home medications including his torsemide.     HPI  Past Medical History:  Diagnosis Date   Arthritis    "hands, feet" (10/31/2013)   CHF (congestive heart failure) (HCC)    Chronic bronchitis (HCC)    High cholesterol    Hypertension    Obesity    OSA on CPAP    Pneumonia 09/2011   Substance abuse ([place]) 8/31/213   cociane "first time use"   Tobacco abuse     Patient Active Problem List   Diagnosis Date Noted   Morbid obesity ([place]) 06/17/2015   Essential hypertension 04/14/2015   Hyperkalemia 04/14/2015   OSA (obstructive sleep apnea)    Difficult intravenous access    Chronic diastolic CHF (congestive heart failure), NYHA class 3 ([place]) 09/13/2014   Cor pulmonale ([place]) 03/19/2014   OSA treated with BiPAP     Past Surgical History:  Procedure Laterality Date   NO PAST SURGERIES     TUBES IN EARS          Home Medications    Prior to Admission medications   Medication Sig Start Date End Date Taking? Authorizing  Provider  albuterol (PROVENTIL) 2 MG tablet Take 2 mg by mouth 3 (three) times daily.   Yes [provider]  OXYGEN Inhale 2 L/min into the lungs continuous.   Yes [provider]  PROVENTIL HFA 108 (90 Base) MCG/ACT inhaler INHALE 1 TO 2 PUFFS BY MOUTH EVERY 6 (SIX) HOURS AS NEEDED FOR WHEEZING OR SHORTNESS OF BREATH 07/24/18  Yes [name], MD  carvedilol (COREG) 6.25 MG tablet TAKE 1 AND 1/2 TABLET BY MOUTH TWICE A DAY 04/10/18   Larey Dresser, MD  hydrALAZINE (APRESOLINE) 25 MG tablet Take 1 tablet (25 mg total) by mouth 3 (three) times daily. 06/22/18   Larey Dresser, MD  losartan (COZAAR) 50 MG tablet TAKE 1 TABLET ( 50 MG TOTAL) BY MOUTH TWICE A DAY 09/27/18   Larey Dresser, MD  metolazone (ZAROXOLYN) 2.5 MG tablet Take 1 tablet (2.5 mg total) by mouth as needed. Patient not taking: Reported on 04/13/2018 03/16/18   Darrick Grinder D, NP  torsemide (DEMADEX) 20 MG tablet TAKE 3 (THREE) TABLETS BY MOUTH TWICE DAILY 07/24/18   Larey Dresser, MD    Family History Family History  Problem Relation Age of Onset   Heart disease Mother        Pacemaker   Hypertension Father    Cancer  Paternal Grandfather    Heart attack Neg Hx    Stroke Neg Hx     Social History Social History   Tobacco Use   Smoking status: Former Smoker    Packs/day: 1.50    Years: 3.00    Pack years: 4.50    Types: Cigarettes    Quit date: 10/31/2013    Years since quitting: 4.9   Smokeless tobacco: Never Used  Substance Use Topics   Alcohol use: No    Alcohol/week: 1.0 standard drinks    Types: 1 Cans of beer per week   Drug use: No    Types: Cocaine    Comment: 10/31/2013 "might use cocaine once/month" - Has not used since 2015.      Allergies   Lisinopril   Review of Systems Review of Systems  Constitutional: Negative for appetite change, chills and fever.  HENT: Negative for ear pain, rhinorrhea, sneezing and sore throat.   Eyes: Negative for photophobia and visual  disturbance.  Respiratory: Positive for shortness of breath. Negative for cough, chest tightness and wheezing.   Cardiovascular: Negative for chest pain and palpitations.  Gastrointestinal: Negative for abdominal pain, blood in stool, constipation, diarrhea, nausea and vomiting.  Genitourinary: Negative for dysuria, hematuria and urgency.  Musculoskeletal: Negative for myalgias.  Skin: Negative for rash.  Neurological: Negative for dizziness, weakness and light-headedness.     Physical Exam Updated Vital Signs BP (!) 124/93    Pulse 60    Temp 98.8 F (37.1 C) (Oral)    Resp (!) 22    SpO2 97%   Physical Exam Vitals signs and nursing note reviewed.  Constitutional:      General: He is not in acute distress.    Appearance: He is well-developed. He is obese.     Comments: 2 L of oxygen being delivered by nasal cannula.  Speaking complete sentences without difficulty.  HENT:     Head: Normocephalic and atraumatic.     Nose: Nose normal.  Eyes:     General: No scleral icterus.       Left eye: No discharge.     Conjunctiva/sclera: Conjunctivae normal.  Neck:     Musculoskeletal: Normal range of motion and neck supple.  Cardiovascular:     Rate and Rhythm: Normal rate and regular rhythm.     Heart sounds: Normal heart sounds. No murmur. No friction rub. No gallop.   Pulmonary:     Effort: Pulmonary effort is normal. No respiratory distress.     Breath sounds: Normal breath sounds.  Abdominal:     General: Bowel sounds are normal. There is no distension.     Palpations: Abdomen is soft.     Tenderness: There is no abdominal tenderness. There is no guarding.  Musculoskeletal: Normal range of motion.     Comments: No lower extremity edema, erythema or calf tenderness bilaterally.  Skin:    General: Skin is warm and dry.     Findings: No rash.  Neurological:     Mental Status: He is alert.     Motor: No abnormal muscle tone.     Coordination: Coordination normal.      ED  Treatments / Results  Labs (all labs ordered are listed, but only abnormal results are displayed) Labs Reviewed  CBC - Abnormal; Notable for the following components:      Result Value   RBC 6.37 (*)    Hemoglobin 18.4 (*)    HCT 64.4 (*)  MCV 101.1 (*)    MCHC 28.6 (*)    RDW 16.9 (*)    Platelets 148 (*)    All other components within normal limits  BRAIN NATRIURETIC PEPTIDE - Abnormal; Notable for the following components:   B Natriuretic Peptide 392.2 (*)    All other components within normal limits  BASIC METABOLIC PANEL - Abnormal; Notable for the following components:   Potassium 5.9 (*)    Chloride 83 (*)    CO2 42 (*)    Glucose, Bld 65 (*)    BUN 50 (*)    Creatinine, Ser 2.01 (*)    GFR calc non Af Amer 42 (*)    GFR calc Af Amer 48 (*)    All other components within normal limits  CBG MONITORING, ED - Abnormal; Notable for the following components:   Glucose-Capillary 138 (*)    All other components within normal limits  TROPONIN I (HIGH SENSITIVITY) - Abnormal; Notable for the following components:   Troponin I (High Sensitivity) 19 (*)    All other components within normal limits  TROPONIN I (HIGH SENSITIVITY) - Abnormal; Notable for the following components:   Troponin I (High Sensitivity) 20 (*)    All other components within normal limits    EKG ED ECG REPORT   Date: 10/09/2018  Rate: 64  Rhythm: normal sinus rhythm  QRS Axis: normal  Intervals: normal  ST/T Wave abnormalities: normal  Conduction Disutrbances:none  Narrative Interpretation: RAD  Old EKG Reviewed: none available  I have personally reviewed the EKG tracing and agree with the computerized printout as noted.   Radiology Dg Chest 2 View  Result Date: 10/09/2018 CLINICAL DATA:  Shortness of breath EXAM: CHEST - 2 VIEW COMPARISON:  09/06/2016 FINDINGS: Cardiomegaly with central vascular congestion. No consolidation or effusion. No pneumothorax. IMPRESSION: Cardiomegaly with mild  central congestion. Negative for edema, pleural effusion or focal airspace disease Electronically Signed   By: Donavan Foil M.D.   On: 10/09/2018 16:51    Procedures Procedures (including critical care time)  Medications Ordered in ED Medications  sodium chloride flush (NS) 0.9 % injection 3 mL (3 mLs Intravenous Not Given 10/09/18 2107)     Initial Impression / Assessment and Plan / ED Course  I have reviewed the triage vital signs and the nursing notes.  Pertinent labs & imaging results that were available during my care of the patient were reviewed by me and considered in my medical decision making (see chart for details).        [name] was evaluated in Emergency Department on 10/09/18  for the symptoms described in the history of present illness. He/she was evaluated in the context of the global COVID-19 pandemic, which necessitated consideration that the patient might be at risk for infection with the SARS-CoV-2 virus that causes COVID-19. Institutional protocols and algorithms that pertain to the evaluation of patients at risk for COVID-19 are in a state of rapid change based on information released by regulatory bodies including the CDC and federal and state organizations. These policies and algorithms were followed during the patient's care in the ED.  33 year old male presents to ED for shortness of breath.  States that his condenser in his portable oxygen was not charged so it was not working.  Patient with saturations in 70% on arrival here, placed on his usual home oxygen with improvement.  States that his symptoms have resolved.  He denies any chest pain, hemoptysis, leg swelling.  On my exam patient is overall well-appearing.  He is on 2 L of oxygen by nasal cannula and speaking in complete sentences.  No lower extremity edema noted on exam.  BNP similar to prior.  CBC, initial and delta troponin both negative. EKG without ischemic changes.  BMP with hyperkalemia  5.9, elevation creatinine to 2 and ambulation BUN to 50. Suspect that symptoms are due to his diuretic use.  We will have him hold diuretic for today.  Informed patient that it is important for him to contact his cardiologist tomorrow for any further work-up and medication changes.  Suspect that his symptoms are due to his oxygen machine malfunction.  He has remained with oxygen saturations above 97% on his usual 2 L here.  He remained symptom-free.  Patient agreeable to plan.  We will have him return for worsening symptoms.  Patient is hemodynamically stable, in NAD, and able to ambulate in the ED. Evaluation does not show pathology that would require ongoing emergent intervention or inpatient treatment. I explained the diagnosis to the patient. Pain has been managed and has no complaints prior to discharge. Patient is comfortable with above plan and is stable for discharge at this time. All questions were answered prior to disposition. Strict return precautions for returning to the ED were discussed. Encouraged follow up with PCP.   An After Visit Summary was printed and given to the patient.   Portions of this note were generated with [employer]. Dictation errors may occur despite best attempts at proofreading.  Final Clinical Impressions(s) / ED Diagnoses   Final diagnoses:  On supplemental oxygen by nasal cannula  Chronic congestive heart failure, unspecified heart failure type [facility])  Hyperkalemia    ED Discharge Orders    None       Delia Heady, PA-C 10/09/18 2358    Quintella Reichert, MD 10/10/18 2123

## 2018-10-09 NOTE — ED Notes (Signed)
Main lab will add on BMP and BNP

## 2018-10-09 NOTE — Discharge Instructions (Addendum)
Do not take your torsemide tonight. Please call your cardiologist tomorrow for further management of your medication if you need any dosage adjustments. Return to the ED if you start to have worsening symptoms, chest pain, shortness of breath, leg swelling.

## 2018-10-10 ENCOUNTER — Other Ambulatory Visit (HOSPITAL_COMMUNITY): Payer: Self-pay | Admitting: *Deleted

## 2018-10-10 ENCOUNTER — Other Ambulatory Visit (HOSPITAL_COMMUNITY): Payer: Self-pay

## 2018-10-10 NOTE — Progress Notes (Signed)
Paramedicine Encounter    Patient ID: Douglas [name]., male    DOB: 12-09-1985, 33 y.o.   MRN: [id number]   Patient Care Team: Medicine, [facility] as PCP - General  Patient Active Problem List   Diagnosis Date Noted  . Morbid obesity ([place]) 06/17/2015  . Essential hypertension 04/14/2015  . Hyperkalemia 04/14/2015  . OSA (obstructive sleep apnea)   . Difficult intravenous access   . Chronic diastolic CHF (congestive heart failure), NYHA class 3 ([place]) 09/13/2014  . Cor pulmonale ([place]) 03/19/2014  . OSA treated with BiPAP     Current Outpatient Medications:  .  albuterol (PROVENTIL) 2 MG tablet, Take 2 mg by mouth 3 (three) times daily., Disp: , Rfl:  .  carvedilol (COREG) 6.25 MG tablet, TAKE 1 AND 1/2 TABLET BY MOUTH TWICE A DAY, Disp: 90 tablet, Rfl: 5 .  hydrALAZINE (APRESOLINE) 25 MG tablet, Take 1 tablet (25 mg total) by mouth 3 (three) times daily., Disp: 270 tablet, Rfl: 1 .  losartan (COZAAR) 50 MG tablet, TAKE 1 TABLET ( 50 MG TOTAL) BY MOUTH TWICE A DAY, Disp: 180 tablet, Rfl: 3 .  OXYGEN, Inhale 2 L/min into the lungs continuous., Disp: , Rfl:  .  PROVENTIL HFA 108 (90 Base) MCG/ACT inhaler, INHALE 1 TO 2 PUFFS BY MOUTH EVERY 6 (SIX) HOURS AS NEEDED FOR WHEEZING OR SHORTNESS OF BREATH, Disp: 6.7 g, Rfl: 0 .  torsemide (DEMADEX) 20 MG tablet, TAKE 3 (THREE) TABLETS BY MOUTH TWICE DAILY, Disp: 180 tablet, Rfl: 3 .  metolazone (ZAROXOLYN) 2.5 MG tablet, Take 1 tablet (2.5 mg total) by mouth as needed. (Patient not taking: Reported on 04/13/2018), Disp: 4 tablet, Rfl: 3 Allergies  Allergen Reactions  . Lisinopril Cough      Social History   Socioeconomic History  . Marital status: Single    Spouse name: Not on file  . Number of children: 2  . Years of education: Not on file  . Highest education level: Not on file  Occupational History  . Occupation: N/A    Employer: NOT EMPLOYED  Social Needs  . Financial resource strain: Not on file  . Food  insecurity    Worry: Not on file    Inability: Not on file  . Transportation needs    Medical: Not on file    Non-medical: Not on file  Tobacco Use  . Smoking status: Former Smoker    Packs/day: 1.50    Years: 3.00    Pack years: 4.50    Types: Cigarettes    Quit date: 10/31/2013    Years since quitting: 4.9  . Smokeless tobacco: Never Used  Substance and Sexual Activity  . Alcohol use: No    Alcohol/week: 1.0 standard drinks    Types: 1 Cans of beer per week  . Drug use: No    Types: Cocaine    Comment: 10/31/2013 "might use cocaine once/month" - Has not used since 2015.   Marland Kitchen Sexual activity: Never  Lifestyle  . Physical activity    Days per week: Not on file    Minutes per session: Not on file  . Stress: Not on file  Relationships  . Social Herbalist on phone: Not on file    Gets together: Not on file    Attends religious service: Not on file    Active member of club or organization: Not on file    Attends meetings of clubs or organizations: Not on  file    Relationship status: Not on file  . Intimate partner violence    Fear of current or ex partner: Not on file    Emotionally abused: Not on file    Physically abused: Not on file    Forced sexual activity: Not on file  Other Topics Concern  . Not on file  Social History Narrative  . Not on file    Physical Exam Cardiovascular:     Rate and Rhythm: Normal rate and regular rhythm.     Pulses: Normal pulses.  Pulmonary:     Effort: Pulmonary effort is normal.     Breath sounds: Normal breath sounds.  Musculoskeletal: Normal range of motion.     Right lower leg: No edema.     Left lower leg: No edema.  Skin:    General: Skin is warm and dry.     Capillary Refill: Capillary refill takes less than 2 seconds.  Neurological:     Mental Status: He is alert and oriented to person, place, and time.  Psychiatric:        Mood and Affect: Mood normal.         Future Appointments  Date Time Provider  [place]  10/11/2018  8:30 AM [facility] PA/NP [facility] None  11/23/2018  2:40 PM Larey Dresser, MD [facility] None    BP 121/82 (BP Location: Left Arm, Patient Position: Sitting, Cuff Size: Normal)   Pulse 72   Resp 16   Wt 272 lb (123.4 kg)   SpO2 92%   BMI 45.54 kg/m   Weight yesterday- 272 lb Last visit weight- 268.7 lb  Douglas [name] was seen at home today as a follow up to his ED visit and to check his oxygen concentrator. Her reported feeling well, denying chest pain, SOB, headache, dizziness, orthopnea, fever or cough. He stated the nurse at the ED told him that he should not take his medications upon getting home last night but did not give further instructions for this morning. As a result Douglas [name] said he has already taken his morning medications upon my arrival. I checked his labs from the ED visit and noted several abnormal values which prompted me to contact the HF clinic. I spoke with Darrick Grinder, NP, who reviewed the chart and said she wanted to see him in clinic first thing in the morning. I relayed this information to Douglas [name] and contacted his sister to ensure he would have transportation. Both were understanding and agreeable. Additionally, Douglas [name] was instructed to abstain from any medications this evening and tomorrow morning before his appointment. I contacted [employer] who provides his portable oxygen concentrator and they advised they would be able to swap out the unit this afternoon. I gave Douglas [name] this information and he said he would be home the rest of the day so they could come at any time. His medications were verified and we confirmed that he is taking them correctly. I will follow up after his clinic appointment.   [name], EMT 10/10/18  ACTION: Home visit completed Next visit planned for after clinic visit

## 2018-10-10 NOTE — ED Notes (Signed)
Patient verbalized understanding of discharge instructions. Opportunity for questions were provided. Pt. ambulatory and discharged home.  

## 2018-10-11 ENCOUNTER — Ambulatory Visit (HOSPITAL_COMMUNITY)
Admission: RE | Admit: 2018-10-11 | Discharge: 2018-10-11 | Disposition: A | Discharge status: Home / Self Care | Payer: Medicaid Other | Source: Ambulatory Visit | Attending: Adult Health | Admitting: Adult Health

## 2018-10-11 ENCOUNTER — Other Ambulatory Visit: Payer: Self-pay

## 2018-10-11 ENCOUNTER — Encounter (HOSPITAL_COMMUNITY): Payer: Self-pay

## 2018-10-11 VITALS — BP 128/68 | HR 82 | Wt 270.8 lb

## 2018-10-11 DIAGNOSIS — I10 Essential (primary) hypertension: Secondary | ICD-10-CM

## 2018-10-11 DIAGNOSIS — I110 Hypertensive heart disease with heart failure: Secondary | ICD-10-CM | POA: Insufficient documentation

## 2018-10-11 DIAGNOSIS — I5032 Chronic diastolic (congestive) heart failure: Secondary | ICD-10-CM

## 2018-10-11 DIAGNOSIS — R0602 Shortness of breath: Secondary | ICD-10-CM | POA: Diagnosis not present

## 2018-10-11 DIAGNOSIS — E7800 Pure hypercholesterolemia, unspecified: Secondary | ICD-10-CM | POA: Insufficient documentation

## 2018-10-11 DIAGNOSIS — R0689 Other abnormalities of breathing: Secondary | ICD-10-CM | POA: Diagnosis not present

## 2018-10-11 DIAGNOSIS — M19042 Primary osteoarthritis, left hand: Secondary | ICD-10-CM | POA: Insufficient documentation

## 2018-10-11 DIAGNOSIS — Z8249 Family history of ischemic heart disease and other diseases of the circulatory system: Secondary | ICD-10-CM | POA: Insufficient documentation

## 2018-10-11 DIAGNOSIS — E875 Hyperkalemia: Secondary | ICD-10-CM | POA: Insufficient documentation

## 2018-10-11 DIAGNOSIS — Z9981 Dependence on supplemental oxygen: Secondary | ICD-10-CM | POA: Diagnosis not present

## 2018-10-11 DIAGNOSIS — Z87891 Personal history of nicotine dependence: Secondary | ICD-10-CM | POA: Insufficient documentation

## 2018-10-11 DIAGNOSIS — M19072 Primary osteoarthritis, left ankle and foot: Secondary | ICD-10-CM | POA: Insufficient documentation

## 2018-10-11 DIAGNOSIS — G4733 Obstructive sleep apnea (adult) (pediatric): Secondary | ICD-10-CM | POA: Insufficient documentation

## 2018-10-11 DIAGNOSIS — M19071 Primary osteoarthritis, right ankle and foot: Secondary | ICD-10-CM | POA: Insufficient documentation

## 2018-10-11 DIAGNOSIS — N179 Acute kidney failure, unspecified: Secondary | ICD-10-CM | POA: Diagnosis not present

## 2018-10-11 DIAGNOSIS — Z79899 Other long term (current) drug therapy: Secondary | ICD-10-CM | POA: Diagnosis not present

## 2018-10-11 DIAGNOSIS — M19041 Primary osteoarthritis, right hand: Secondary | ICD-10-CM | POA: Insufficient documentation

## 2018-10-11 LAB — BASIC METABOLIC PANEL
Anion gap: 10 (ref 5–15)
BUN: 41 mg/dL — ABNORMAL HIGH (ref 6–20)
CO2: 44 mmol/L — ABNORMAL HIGH (ref 22–32)
Calcium: 9 mg/dL (ref 8.9–10.3)
Chloride: 85 mmol/L — ABNORMAL LOW (ref 98–111)
Creatinine, Ser: 1.61 mg/dL — ABNORMAL HIGH (ref 0.61–1.24)
GFR calc Af Amer: >60 mL/min (ref >60)
GFR calc non Af Amer: 55 mL/min — ABNORMAL LOW (ref >60)
Glucose, Bld: 91 mg/dL (ref 70–99)
Potassium: 5.5 mmol/L — ABNORMAL HIGH (ref 3.5–5.1)
Sodium: 139 mmol/L (ref 135–145)

## 2018-10-11 NOTE — Addendum Note (Signed)
Encounter addended by: Kerry Dory, CMA on: 10/11/2018 10:57 AM  Actions taken: Clinical Note Signed

## 2018-10-11 NOTE — Patient Instructions (Addendum)
STOP Losartan  HOLD Torsemide until we call about lab results  Labs today We will only contact you if something comes back abnormal or we need to make some changes. Otherwise no news is good news!      Your physician recommends that you schedule a follow-up appointment in: 3 months with [name] Clegg,NP  Do the following things EVERYDAY: 1) Weigh yourself in the morning before breakfast. Write it down and keep it in a log. 2) Take your medicines as prescribed 3) Eat low salt foods-Limit salt (sodium) to 2000 mg per day.  4) Stay as active as you can everyday 5) Limit all fluids for the day to less than 2 liters  At the [facility], you and your health needs are our priority. As part of our continuing mission to provide you with exceptional heart care, we have created designated Provider Care Teams. These Care Teams include your primary Cardiologist (physician) and Advanced Practice Providers (APPs- Physician Assistants and Nurse Practitioners) who all work together to provide you with the care you need, when you need it.   You may see any of the following providers on your designated Care Team at your next follow up: Marland Kitchen Dr Glori Bickers . Dr [name] . Darrick Grinder, NP   Please be sure to bring in all your medications bottles to every appointment.

## 2018-10-11 NOTE — Progress Notes (Signed)
Medication Samples have been provided to the patient.  Drug name: [facility]       Strength: 10        Qty: 1  [id number]  Exp.Date: 04/23/20  Dosing instructions: ONE PACKET AS DIRECTED  The patient has been instructed regarding the correct time, dose, and frequency of taking this medication, including desired effects and most common side effects.   Kerry Dory 10:52 AM 10/11/2018

## 2018-10-11 NOTE — Addendum Note (Signed)
Encounter addended by: Kerry Dory, CMA on: 10/11/2018 10:37 AM  Actions taken: Medication long-term status modified, Clinical Note Signed, Order list changed, Diagnosis association updated

## 2018-10-11 NOTE — Addendum Note (Signed)
Encounter addended by: Kerry Dory, CMA on: 10/11/2018 10:45 AM  Actions taken: Clinical Note Signed

## 2018-10-11 NOTE — Progress Notes (Signed)
Patient ID: Douglas [name]., male   DOB: Feb 03, 1985, 32 y.o.   MRN: [id number]    Advanced Heart Failure Clinic Note   PCP: none.  Primary Cardiologist: Dr Aundra Dubin  Pulmonary: Dr Halford Chessman    HPI: Mr Biagioni is a 33 yo with OHS/OSA, morbid obesity, HTN, diastolic CHF with prominent RV failure.   Admitted in August 2016  to [facility] with volume overload. Diuresed with lasix drip and diamox. Transitioned to lasix 80 mg twice a day. Discharge weight 257 pounds.   Admit to [facility] 3/21 through 04/28/15 with respiratory distress. Required short term intubation. Apparently had not been wearing BiPap. Discharge weight was 259 pounds.   Earlier this week he was seen in the ED with increased shortness of breath. Apparently he had run out of his oxygen. He was evaluated and discharged home. K was 5.9.   Today he returns for HF follow up.Overall feeling better. Gets SOB when he is off oxygen.SOB with steps.  He is on 2 liters oxygen continuously.  Denies PND/Orthopnea. Appetite ok. Says he drinking lots of juice. No fever or chills. Weight at home 272 pounds. CPAP machine was removed because he stopped using it.Taking all medications.  - ECHO 03/20/2014 EF 60-65% - ECHO 8/18: EF 55-60%, grade II diastolic dydsfunction, D-shaped septum suggesting RV pressure/volume overload, severe RV dilation with mild-moderately decreased RV systolic function, unable to estimate PA pressure.  - ECHO 5/20: EF 55-60%, moderate RV dilation with mildly decreased systolic function, unable to estimate PASP, D-shaped interventricular septum.   Review of systems complete and found to be negative unless listed in HPI.   SH:  Social History   Socioeconomic History  . Marital status: Single    Spouse name: Not on file  . Number of children: 2  . Years of education: Not on file  . Highest education level: Not on file  Occupational History  . Occupation: N/A    Employer: NOT EMPLOYED  Social Needs  . Financial resource strain:  Not on file  . Food insecurity    Worry: Not on file    Inability: Not on file  . Transportation needs    Medical: Not on file    Non-medical: Not on file  Tobacco Use  . Smoking status: Former Smoker    Packs/day: 1.50    Years: 3.00    Pack years: 4.50    Types: Cigarettes    Quit date: 10/31/2013    Years since quitting: 4.9  . Smokeless tobacco: Never Used  Substance and Sexual Activity  . Alcohol use: No    Alcohol/week: 1.0 standard drinks    Types: 1 Cans of beer per week  . Drug use: No    Types: Cocaine    Comment: 10/31/2013 "might use cocaine once/month" - Has not used since 2015.   Marland Kitchen Sexual activity: Never  Lifestyle  . Physical activity    Days per week: Not on file    Minutes per session: Not on file  . Stress: Not on file  Relationships  . Social Herbalist on phone: Not on file    Gets together: Not on file    Attends religious service: Not on file    Active member of club or organization: Not on file    Attends meetings of clubs or organizations: Not on file    Relationship status: Not on file  . Intimate partner violence    Fear of current or ex partner: Not  on file    Emotionally abused: Not on file    Physically abused: Not on file    Forced sexual activity: Not on file  Other Topics Concern  . Not on file  Social History Narrative  . Not on file    FH:  Family History  Problem Relation Age of Onset  . Heart disease Mother        Pacemaker  . Hypertension Father   . Cancer Paternal Grandfather   . Heart attack Neg Hx   . Stroke Neg Hx     Past Medical History:  Diagnosis Date  . Arthritis    "hands, feet" (10/31/2013)  . CHF (congestive heart failure) ([place])   . Chronic bronchitis ([place])   . High cholesterol   . Hypertension   . Obesity   . OSA on CPAP   . Pneumonia 09/2011  . Substance abuse ([place]) 8/31/213   cociane "first time use"  . Tobacco abuse     Current Outpatient Medications  Medication Sig Dispense Refill   . albuterol (PROVENTIL) 2 MG tablet Take 2 mg by mouth 3 (three) times daily.    . carvedilol (COREG) 6.25 MG tablet TAKE 1 AND 1/2 TABLET BY MOUTH TWICE A DAY 90 tablet 5  . hydrALAZINE (APRESOLINE) 25 MG tablet Take 1 tablet (25 mg total) by mouth 3 (three) times daily. 270 tablet 1  . losartan (COZAAR) 50 MG tablet TAKE 1 TABLET ( 50 MG TOTAL) BY MOUTH TWICE A DAY 180 tablet 3  . OXYGEN Inhale 2 L/min into the lungs continuous.    Marland Kitchen PROVENTIL HFA 108 (90 Base) MCG/ACT inhaler INHALE 1 TO 2 PUFFS BY MOUTH EVERY 6 (SIX) HOURS AS NEEDED FOR WHEEZING OR SHORTNESS OF BREATH 6.7 g 0  . torsemide (DEMADEX) 20 MG tablet TAKE 3 (THREE) TABLETS BY MOUTH TWICE DAILY 180 tablet 3  . metolazone (ZAROXOLYN) 2.5 MG tablet Take 1 tablet (2.5 mg total) by mouth as needed. (Patient not taking: Reported on 04/13/2018) 4 tablet 3   No current facility-administered medications for this encounter.     Vitals:   10/11/18 0915  BP: 128/68  Pulse: 82  SpO2: 92%  Weight: 122.8 kg (270 lb 12.8 oz)   Wt Readings from Last 3 Encounters:  10/11/18 122.8 kg (270 lb 12.8 oz)  10/10/18 123.4 kg (272 lb)  10/04/18 121.9 kg (268 lb 11.2 oz)    Vitals:   10/11/18 0915  BP: 128/68  Pulse: 82  SpO2: 92%    PHYSICAL EXAM: General:  Well appearing. No resp difficulty HEENT: normal Neck: supple. JVP 8-9. Carotids 2+ bilat; no bruits. No lymphadenopathy or thryomegaly appreciated. Cor: PMI nondisplaced. Regular rate & rhythm. No rubs, gallops or murmurs. Lungs: clear 2 liters oxygen.  Abdomen: obese, soft, nontender, nondistended. No hepatosplenomegaly. No bruits or masses. Good bowel sounds. Extremities: no cyanosis, clubbing, rash, R and LLE 1+ edema Neuro: alert & orientedx3, cranial nerves grossly intact. moves all 4 extremities w/o difficulty. Affect pleasant   ASSESSMENT & PLAN:  1.Chronic diastolic CHF with prominent RV failure: Echo was done today and reviewed.  EF 55-60% with moderately dilated/mildly  dysfunctional RV and D-shaped interventricular septum. RV failure is in the setting of OSA and OHS.   - NYHA III. Volume status mildly elevated.  Anticipate starting torsemide 80 mg daily. I think once a day dosing will be easier for him to manage.   - Keep off losartan.  - Continue Coreg 9.375 mg BID  2. OHS/OSA:  CPAP was removed because he .  We had a long discussion about this, needs to wear oxygen at all times and use CPAP every night.  3. HTN:    Stable. Keep off losartan  - Continue  hydralazine to 25 mg tid. 4. Hyperkalemia  K 5.9 in the ED earlier this week. Says he had been eating 5-6 banana a day. Check BMET stat.  5. AKI  Recent creatinine up to 2. Check BMEt today. Adjust diuretics after he have the results.    Follow up in 3 months. Letter provided today for housing. Hopefully he can get housing without steps.   Greater than 50% of the (total minutes 25) visit spent in counseling/coordination of care regarding the above.     Darrick Grinder, NP 10/11/2018

## 2018-10-12 ENCOUNTER — Telehealth (HOSPITAL_COMMUNITY): Payer: Self-pay | Admitting: Cardiology

## 2018-10-12 DIAGNOSIS — I5032 Chronic diastolic (congestive) heart failure: Secondary | ICD-10-CM

## 2018-10-12 MED ORDER — TORSEMIDE 20 MG PO TABS: 60.0000 mg | ORAL_TABLET | Freq: Every day | ORAL | 3 refills | Status: DC

## 2018-10-12 NOTE — Telephone Encounter (Signed)
Notes recorded by Kerry Dory, CMA on 10/11/2018 at 5:04 PM EDT  Patient aware. Patient voiced understanding. Patient was able to successfully take lokelma while on the phone. Repeat labs 9/28   ------   Notes recorded by Darrick Grinder D, NP on 10/11/2018 at 11:58 AM EDT  Renal function improving. Start torsemide 60 mg daily. K elevated. Give dose of lokelma 10 gram now. Repeat BMET next week. He should avoid high potassium foods. Of note he has been eating 5-6 bananas a day.

## 2018-10-14 ENCOUNTER — Emergency Department (HOSPITAL_COMMUNITY): Payer: Medicaid Other

## 2018-10-14 ENCOUNTER — Encounter (HOSPITAL_COMMUNITY): Payer: Self-pay | Admitting: Emergency Medicine

## 2018-10-14 ENCOUNTER — Inpatient Hospital Stay (HOSPITAL_COMMUNITY): Payer: Medicaid Other

## 2018-10-14 ENCOUNTER — Other Ambulatory Visit: Payer: Self-pay

## 2018-10-14 ENCOUNTER — Inpatient Hospital Stay (HOSPITAL_COMMUNITY)
Admission: EM | Admit: 2018-10-14 | Discharge: 2018-10-17 | DRG: 065 | Disposition: A | Discharge status: Home / Self Care | Payer: Medicaid Other | Attending: Internal Medicine | Admitting: Internal Medicine

## 2018-10-14 DIAGNOSIS — R471 Dysarthria and anarthria: Secondary | ICD-10-CM | POA: Diagnosis present

## 2018-10-14 DIAGNOSIS — I63512 Cerebral infarction due to unspecified occlusion or stenosis of left middle cerebral artery: Secondary | ICD-10-CM | POA: Diagnosis not present

## 2018-10-14 DIAGNOSIS — I5031 Acute diastolic (congestive) heart failure: Secondary | ICD-10-CM

## 2018-10-14 DIAGNOSIS — E662 Morbid (severe) obesity with alveolar hypoventilation: Secondary | ICD-10-CM | POA: Diagnosis present

## 2018-10-14 DIAGNOSIS — Z9981 Dependence on supplemental oxygen: Secondary | ICD-10-CM | POA: Diagnosis not present

## 2018-10-14 DIAGNOSIS — I63412 Cerebral infarction due to embolism of left middle cerebral artery: Secondary | ICD-10-CM | POA: Diagnosis present

## 2018-10-14 DIAGNOSIS — I130 Hypertensive heart and chronic kidney disease with heart failure and stage 1 through stage 4 chronic kidney disease, or unspecified chronic kidney disease: Secondary | ICD-10-CM | POA: Diagnosis present

## 2018-10-14 DIAGNOSIS — I6300 Cerebral infarction due to thrombosis of unspecified precerebral artery: Secondary | ICD-10-CM | POA: Diagnosis not present

## 2018-10-14 DIAGNOSIS — M1990 Unspecified osteoarthritis, unspecified site: Secondary | ICD-10-CM | POA: Diagnosis present

## 2018-10-14 DIAGNOSIS — I110 Hypertensive heart disease with heart failure: Secondary | ICD-10-CM | POA: Diagnosis not present

## 2018-10-14 DIAGNOSIS — Z8249 Family history of ischemic heart disease and other diseases of the circulatory system: Secondary | ICD-10-CM | POA: Diagnosis not present

## 2018-10-14 DIAGNOSIS — R29705 NIHSS score 5: Secondary | ICD-10-CM | POA: Diagnosis present

## 2018-10-14 DIAGNOSIS — I6389 Other cerebral infarction: Secondary | ICD-10-CM | POA: Diagnosis not present

## 2018-10-14 DIAGNOSIS — I5032 Chronic diastolic (congestive) heart failure: Secondary | ICD-10-CM | POA: Diagnosis present

## 2018-10-14 DIAGNOSIS — F1290 Cannabis use, unspecified, uncomplicated: Secondary | ICD-10-CM | POA: Diagnosis present

## 2018-10-14 DIAGNOSIS — Z87891 Personal history of nicotine dependence: Secondary | ICD-10-CM | POA: Diagnosis not present

## 2018-10-14 DIAGNOSIS — R29810 Facial weakness: Secondary | ICD-10-CM | POA: Diagnosis present

## 2018-10-14 DIAGNOSIS — D751 Secondary polycythemia: Secondary | ICD-10-CM

## 2018-10-14 DIAGNOSIS — Z20828 Contact with and (suspected) exposure to other viral communicable diseases: Secondary | ICD-10-CM | POA: Diagnosis present

## 2018-10-14 DIAGNOSIS — I10 Essential (primary) hypertension: Secondary | ICD-10-CM

## 2018-10-14 DIAGNOSIS — I69351 Hemiplegia and hemiparesis following cerebral infarction affecting right dominant side: Secondary | ICD-10-CM

## 2018-10-14 DIAGNOSIS — Z888 Allergy status to other drugs, medicaments and biological substances status: Secondary | ICD-10-CM | POA: Diagnosis not present

## 2018-10-14 DIAGNOSIS — E785 Hyperlipidemia, unspecified: Secondary | ICD-10-CM | POA: Diagnosis present

## 2018-10-14 DIAGNOSIS — Z6841 Body Mass Index (BMI) 40.0 and over, adult: Secondary | ICD-10-CM

## 2018-10-14 DIAGNOSIS — E7800 Pure hypercholesterolemia, unspecified: Secondary | ICD-10-CM | POA: Diagnosis present

## 2018-10-14 DIAGNOSIS — I639 Cerebral infarction, unspecified: Secondary | ICD-10-CM | POA: Diagnosis present

## 2018-10-14 DIAGNOSIS — R130 Aphagia: Secondary | ICD-10-CM | POA: Diagnosis not present

## 2018-10-14 DIAGNOSIS — I2510 Atherosclerotic heart disease of native coronary artery without angina pectoris: Secondary | ICD-10-CM | POA: Diagnosis present

## 2018-10-14 DIAGNOSIS — G4733 Obstructive sleep apnea (adult) (pediatric): Secondary | ICD-10-CM | POA: Diagnosis not present

## 2018-10-14 DIAGNOSIS — G8191 Hemiplegia, unspecified affecting right dominant side: Secondary | ICD-10-CM

## 2018-10-14 DIAGNOSIS — N183 Chronic kidney disease, stage 3 (moderate): Secondary | ICD-10-CM | POA: Diagnosis present

## 2018-10-14 DIAGNOSIS — I2781 Cor pulmonale (chronic): Secondary | ICD-10-CM

## 2018-10-14 DIAGNOSIS — I361 Nonrheumatic tricuspid (valve) insufficiency: Secondary | ICD-10-CM | POA: Diagnosis not present

## 2018-10-14 DIAGNOSIS — J42 Unspecified chronic bronchitis: Secondary | ICD-10-CM | POA: Diagnosis present

## 2018-10-14 DIAGNOSIS — Z79899 Other long term (current) drug therapy: Secondary | ICD-10-CM | POA: Diagnosis not present

## 2018-10-14 DIAGNOSIS — Q211 Atrial septal defect: Secondary | ICD-10-CM | POA: Diagnosis not present

## 2018-10-14 DIAGNOSIS — E669 Obesity, unspecified: Secondary | ICD-10-CM

## 2018-10-14 DIAGNOSIS — R4781 Slurred speech: Secondary | ICD-10-CM | POA: Diagnosis not present

## 2018-10-14 DIAGNOSIS — F1910 Other psychoactive substance abuse, uncomplicated: Secondary | ICD-10-CM | POA: Diagnosis not present

## 2018-10-14 LAB — I-STAT CHEM 8, ED
BUN: 39 mg/dL — ABNORMAL HIGH (ref 6–20)
Calcium, Ion: 1.02 mmol/L — ABNORMAL LOW (ref 1.15–1.40)
Chloride: 89 mmol/L — ABNORMAL LOW (ref 98–111)
Creatinine, Ser: 1.6 mg/dL — ABNORMAL HIGH (ref 0.61–1.24)
Glucose, Bld: 115 mg/dL — ABNORMAL HIGH (ref 70–99)
HCT: 64 % — ABNORMAL HIGH (ref 39.0–52.0)
Hemoglobin: 21.8 g/dL (ref 13.0–17.0)
Potassium: 4.4 mmol/L (ref 3.5–5.1)
Sodium: 140 mmol/L (ref 135–145)
TCO2: 44 mmol/L — ABNORMAL HIGH (ref 22–32)

## 2018-10-14 LAB — COMPREHENSIVE METABOLIC PANEL
ALT: 16 U/L (ref 0–44)
AST: 31 U/L (ref 15–41)
Albumin: 3.1 g/dL — ABNORMAL LOW (ref 3.5–5.0)
Alkaline Phosphatase: 47 U/L (ref 38–126)
Anion gap: 14 (ref 5–15)
BUN: 27 mg/dL — ABNORMAL HIGH (ref 6–20)
CO2: 37 mmol/L — ABNORMAL HIGH (ref 22–32)
Calcium: 8.4 mg/dL — ABNORMAL LOW (ref 8.9–10.3)
Chloride: 89 mmol/L — ABNORMAL LOW (ref 98–111)
Creatinine, Ser: 1.83 mg/dL — ABNORMAL HIGH (ref 0.61–1.24)
GFR calc Af Amer: 54 mL/min — ABNORMAL LOW (ref >60)
GFR calc non Af Amer: 47 mL/min — ABNORMAL LOW (ref >60)
Glucose, Bld: 121 mg/dL — ABNORMAL HIGH (ref 70–99)
Potassium: 4.6 mmol/L (ref 3.5–5.1)
Sodium: 140 mmol/L (ref 135–145)
Total Bilirubin: 1.4 mg/dL — ABNORMAL HIGH (ref 0.3–1.2)
Total Protein: 6.1 g/dL — ABNORMAL LOW (ref 6.5–8.1)

## 2018-10-14 LAB — DIFFERENTIAL
Abs Immature Granulocytes: 0.02 10*3/uL (ref 0.00–0.07)
Basophils Absolute: 0 10*3/uL (ref 0.0–0.1)
Basophils Relative: 1 %
Eosinophils Absolute: 0.1 10*3/uL (ref 0.0–0.5)
Eosinophils Relative: 2 %
Immature Granulocytes: 0 %
Lymphocytes Relative: 17 %
Lymphs Abs: 1.3 10*3/uL (ref 0.7–4.0)
Monocytes Absolute: 1 10*3/uL (ref 0.1–1.0)
Monocytes Relative: 13 %
Neutro Abs: 5.3 10*3/uL (ref 1.7–7.7)
Neutrophils Relative %: 67 %

## 2018-10-14 LAB — LIPID PANEL
Cholesterol: 124 mg/dL (ref 0–200)
HDL: 35 mg/dL — ABNORMAL LOW (ref >40)
LDL Cholesterol: 77 mg/dL (ref 0–99)
Total CHOL/HDL Ratio: 3.5 RATIO
Triglycerides: 59 mg/dL (ref <150)
VLDL: 12 mg/dL (ref 0–40)

## 2018-10-14 LAB — CBC
HCT: 61.1 % — ABNORMAL HIGH (ref 39.0–52.0)
Hemoglobin: 18.2 g/dL — ABNORMAL HIGH (ref 13.0–17.0)
MCH: 29.4 pg (ref 26.0–34.0)
MCHC: 29.8 g/dL — ABNORMAL LOW (ref 30.0–36.0)
MCV: 98.5 fL (ref 80.0–100.0)
Platelets: 161 10*3/uL (ref 150–400)
RBC: 6.2 MIL/uL — ABNORMAL HIGH (ref 4.22–5.81)
RDW: 16.3 % — ABNORMAL HIGH (ref 11.5–15.5)
WBC: 7.8 10*3/uL (ref 4.0–10.5)
nRBC: 0 % (ref 0.0–0.2)

## 2018-10-14 LAB — ECHOCARDIOGRAM COMPLETE
Height: 63 in
Weight: 4359.82 oz

## 2018-10-14 LAB — HEMOGLOBIN A1C
Hgb A1c MFr Bld: 5.6 % (ref 4.8–5.6)
Mean Plasma Glucose: 114.02 mg/dL

## 2018-10-14 LAB — RAPID URINE DRUG SCREEN, HOSP PERFORMED
Amphetamines: NOT DETECTED
Barbiturates: NOT DETECTED
Benzodiazepines: NOT DETECTED
Cocaine: NOT DETECTED
Opiates: NOT DETECTED
Tetrahydrocannabinol: NOT DETECTED

## 2018-10-14 LAB — PROTIME-INR
INR: 1.1 (ref 0.8–1.2)
Prothrombin Time: 13.8 seconds (ref 11.4–15.2)

## 2018-10-14 LAB — SARS CORONAVIRUS 2 (TAT 6-24 HRS): SARS Coronavirus 2: NEGATIVE

## 2018-10-14 LAB — CBG MONITORING, ED: Glucose-Capillary: 116 mg/dL — ABNORMAL HIGH (ref 70–99)

## 2018-10-14 LAB — APTT: aPTT: 28 seconds (ref 24–36)

## 2018-10-14 MED ORDER — HYDRALAZINE HCL 20 MG/ML IJ SOLN: 5.0000 mg | INTRAMUSCULAR | Status: DC | PRN

## 2018-10-14 MED ORDER — ASPIRIN 325 MG PO TABS
325.0000 mg | ORAL_TABLET | Freq: Every day | ORAL | Status: DC
  Administered 2018-10-14 – 2018-10-15 (×2): 325 mg via ORAL
  Filled 2018-10-14 (×2): qty 1

## 2018-10-14 MED ORDER — SODIUM CHLORIDE 0.9% FLUSH
3.0000 mL | Freq: Once | INTRAVENOUS | Status: AC
Start: 2018-10-14 — End: 2018-10-14
  Administered 2018-10-14: 07:00:00 3 mL via INTRAVENOUS

## 2018-10-14 MED ORDER — ENOXAPARIN SODIUM 40 MG/0.4ML ~~LOC~~ SOLN
40.0000 mg | SUBCUTANEOUS | Status: DC
  Administered 2018-10-15: 40 mg via SUBCUTANEOUS
  Filled 2018-10-14: qty 0.4

## 2018-10-14 MED ORDER — CLOPIDOGREL BISULFATE 75 MG PO TABS
75.0000 mg | ORAL_TABLET | Freq: Every day | ORAL | Status: DC
  Administered 2018-10-15: 75 mg via ORAL
  Filled 2018-10-14: qty 1

## 2018-10-14 MED ORDER — ACETAMINOPHEN 160 MG/5ML PO SOLN: 650.0000 mg | ORAL | Status: DC | PRN

## 2018-10-14 MED ORDER — SENNOSIDES-DOCUSATE SODIUM 8.6-50 MG PO TABS: 1.0000 | ORAL_TABLET | Freq: Every evening | ORAL | Status: DC | PRN

## 2018-10-14 MED ORDER — ASPIRIN 300 MG RE SUPP: 300.0000 mg | Freq: Every day | RECTAL | Status: DC

## 2018-10-14 MED ORDER — STROKE: EARLY STAGES OF RECOVERY BOOK
Freq: Once | Status: AC
  Administered 2018-10-14: 12:00:00
  Filled 2018-10-14: qty 1

## 2018-10-14 MED ORDER — CLOPIDOGREL BISULFATE 75 MG PO TABS
300.0000 mg | ORAL_TABLET | Freq: Once | ORAL | Status: AC
  Administered 2018-10-14: 15:00:00 300 mg via ORAL
  Filled 2018-10-14: qty 4

## 2018-10-14 MED ORDER — IOHEXOL 350 MG/ML SOLN
100.0000 mL | Freq: Once | INTRAVENOUS | Status: AC | PRN
  Administered 2018-10-14: 07:00:00 100 mL via INTRAVENOUS

## 2018-10-14 MED ORDER — ACETAMINOPHEN 650 MG RE SUPP: 650.0000 mg | RECTAL | Status: DC | PRN

## 2018-10-14 MED ORDER — ATORVASTATIN CALCIUM 80 MG PO TABS
80.0000 mg | ORAL_TABLET | Freq: Every day | ORAL | Status: DC
  Administered 2018-10-14 – 2018-10-16 (×3): 80 mg via ORAL
  Filled 2018-10-14 (×3): qty 1

## 2018-10-14 MED ORDER — ACETAMINOPHEN 325 MG PO TABS: 650.0000 mg | ORAL_TABLET | ORAL | Status: DC | PRN

## 2018-10-14 NOTE — Consult Note (Signed)
Neurology Consultation Reason for Consult: Right-sided weakness Referring Physician: Horton, C  CC: Right-sided weakness  History is obtained from: Patient  HPI: Douglas [name]. is a 33 y.o. male with a history of CHF who presents with right-sided weakness that was present on awakening.  He states that he went to bed around 11 PM, he then awoke around 5:30 AM and noticed right-sided weakness on awakening.  EMS was called and then they initially felt that he had a visual field deficit and therefore activated a code stroke.  On arrival here, he does not have a clear visual field deficit, but he does have significant right-sided weakness.  LKW: 11 PM tpa given?: no, outside of window  ROS: A 14 point ROS was performed and is negative except as noted in the HPI.   Past Medical History:  Diagnosis Date  . Arthritis    "hands, feet" (10/31/2013)  . CHF (congestive heart failure) ([place])   . Chronic bronchitis ([place])   . High cholesterol   . Hypertension   . Obesity   . OSA on CPAP   . Pneumonia 09/2011  . Substance abuse ([place]) 8/31/213   cociane "first time use"  . Tobacco abuse      Family History  Problem Relation Age of Onset  . Heart disease Mother        Pacemaker  . Hypertension Father   . Cancer Paternal Grandfather   . Heart attack Neg Hx   . Stroke Neg Hx      Social History:  reports that he quit smoking about 4 years ago. His smoking use included cigarettes. He has a 4.50 pack-year smoking history. He has never used smokeless tobacco. He reports that he does not drink alcohol or use drugs.   Exam: Current vital signs:  Vital signs in last 24 hours:     Physical Exam  Constitutional: Appears obese Psych: Affect appropriate to situation Eyes: No scleral injection HENT: No OP obstrucion Head: Normocephalic.  Cardiovascular: Normal rate and regular rhythm.  Respiratory: Effort normal, non-labored breathing GI: Soft.  No distension. There is no  tenderness.  Skin: WDI  Neuro: Mental Status: Patient is awake, alert, oriented to person, place, age, does not give the month.  Patient is able to give a clear and coherent history. No signs of aphasia or neglect. He is dysarthric.  Cranial Nerves: II: Visual Fields are full. Pupils are equal, round, and reactive to light.   III,IV, VI: EOMI without ptosis or diploplia.  V: Facial sensation is symmetric to temperature VII: Facial movement is weak on the right. VIII: hearing is intact to voice X: Uvula elevates symmetrically XI: Shoulder shrug is symmetric. XII: tongue is midline without atrophy or fasciculations.  Motor: Tone is normal. Bulk is normal. He has 3/5 strength RUE, 4/5 RLE Sensory: Sensation is diminished on the right.  Cerebellar: No clear ataxia on the left    I have reviewed labs in epic and the results pertinent to this consultation are: Cr 2.01 on 09/15  I have reviewed the images obtained:CT head - negative.   Impression: 33 yo M with right sided weakness consistent with acute ischemic stroke. His occlusion is distal and he has already infarcted much of the area at risk, thereofre not an IR candidate. He will need to be admitted for secondary risk factor modification and therapy.   Recommendations: - HgbA1c, fasting lipid panel - MRI  of the brain without contrast - Frequent neuro checks -  Echocardiogram - Prophylactic therapy-Antiplatelet med: Aspirin - dose 325mg  PO or 300mg  PR - Risk factor modification - Telemetry monitoring - PT consult, OT consult, Speech consult - Stroke team to follow   Roland Rack, MD [facility] [phone]  If 7pm- 7am, please page neurology on call as listed in [place].

## 2018-10-14 NOTE — Progress Notes (Signed)
VASCULAR LAB PRELIMINARY  PRELIMINARY  PRELIMINARY  PRELIMINARY  Bilateral lower extremity venous duplex completed.    Preliminary report:  See CV proc for preliminary results.  Cartel Mauss, RVT 10/14/2018, 10:01 AM

## 2018-10-14 NOTE — Code Documentation (Signed)
Responded to Code stroke called at [id number] for R sided facial droop and R sided weakness, LSN-2300. CBG-116, NIH-5 for R facial droop, R arm drift, and slurred speech.  CT head negative for acute changes. CTA/CTP done, no LVO. Handoff with ED RN. Neuro and VS q2h.

## 2018-10-14 NOTE — Progress Notes (Signed)
SLP Cancellation Note  Patient Details Name: Douglas [name]. MRN: [id number] DOB: February 01, 1985   Cancelled treatment:       Reason Eval/Treat Not Completed: Patient at procedure or test/unavailable   Matthew Pais, Katherene Ponto 10/14/2018, 2:10 PM

## 2018-10-14 NOTE — ED Notes (Signed)
Pt SpO2 on RA @ 84%, placed [place] @ 4L O2 with no change in saturation level. NRB mask placed back on pt. Saturation increased to 100%

## 2018-10-14 NOTE — H&P (Addendum)
Date: 10/14/2018               Patient Name:  Douglas [name]. MRN: [id number]  DOB: 1986-01-14 Age / Sex: 33 y.o., male   PCP: Medicine, [facility]         Medical Service: Internal Medicine Teaching Service         Attending Physician: Dr. Merryl Hacker, MD    First Contact: Dr. [name] Coil Pager: [phone]  Second Contact: Dr. Berline Lopes  Pager: [phone]       After Hours (After 5p/  First Contact Pager: [phone]  weekends / holidays): Second Contact Pager: [phone]   Chief Complaint: Right sided weakness  History of Present Illness: Douglas [name] is a 33 year old male with a past medical history significant for CHF (EF 55-60% last week) on 2 L O2 at baseline, HTN, polysubstance abuse, obesity, and OSA who presented to the ED today after experiencing right arm weakness, right leg weakness, and slurred speech when he woke up this morning. The patient says he went to bed around 1030 last night and was in his normal state of health.  He woke up in the middle of the night to go to the bathroom and realized that his arm and leg are weak and he had increased difficulty ambulating.  He said he went to his front door and was not able to use his right arm so use the doorknob. He uses a sliding door in his house to get out and went to a neighbor's house to call for help. His neighbor called 911. The patient denied having fevers, headaches, vision changes, chest pain, shortness of breath, abdominal pain, changes in his bowel or bladder habits, or numbness or tingling anywhere.  Of note, the patient had an ED visit on 9/15 for increased shortness of breath after his condenser and it is O2 tank was malfunctioning.  The patient was discharged from the ED after being stabilized and followed up with advanced heart failure clinic on 9/17.  Was started torsemide 60 mg daily at this visit.    In the ED a CBC, BNP, BMP, troponins, were ordered. Labs were notable for elevated BUN and  creatinine to 27 and 1.83 respectively.  Patient had CT angiography and CT cerebral perfusion scans which were notable for an old L cerebellar infarct, severe proximal left M2 stenosis and occlusion of distal left M2 proximal M3 vessel, and moderate PCA stenosis.  Meds:  Current Meds  Medication Sig  . carvedilol (COREG) 6.25 MG tablet TAKE 1 AND 1/2 TABLET BY MOUTH TWICE A DAY (Patient taking differently: Take 9.375 mg by mouth 2 (two) times daily with a meal. )  . hydrALAZINE (APRESOLINE) 25 MG tablet Take 1 tablet (25 mg total) by mouth 3 (three) times daily.  Marland Kitchen PROVENTIL HFA 108 (90 Base) MCG/ACT inhaler INHALE 1 TO 2 PUFFS BY MOUTH EVERY 6 (SIX) HOURS AS NEEDED FOR WHEEZING OR SHORTNESS OF BREATH (Patient taking differently: Inhale 1-2 puffs into the lungs every 6 (six) hours as needed for wheezing. )  . torsemide (DEMADEX) 20 MG tablet Take 3 tablets (60 mg total) by mouth daily.   Allergies: Allergies as of 10/14/2018 - Review Complete 10/14/2018  Allergen Reaction Noted  . Lisinopril Cough 09/01/2015   Past Medical History:  Diagnosis Date  . Arthritis    "hands, feet" (10/31/2013)  . CHF (congestive heart failure) ([place])   . Chronic bronchitis ([place])   . High cholesterol   .  Hypertension   . Obesity   . OSA on CPAP   . Pneumonia 09/2011  . Substance abuse ([place]) 8/31/213   cociane "first time use"  . Tobacco abuse    Surgical History Past Surgical History:  Procedure Laterality Date  . NO PAST SURGERIES    . TUBES IN EARS     Family History:  - Mother & Father: HTN  Social History:  - Lives by himself on disability the last several years - Former smoker, 1.5 packs a day for 3 years, quit in 2015 - Used to use cocaine in the past but has not used since 2015.  Smoked marijuana last night. - Denies alcohol use  Review of Systems: All systems were reviewed and are otherwise negative unless mentioned in the HPI.  Imaging:  CT Head: IMPRESSION: 1. Old left cerebellar  infarct. 2. ASPECTS is 10.  CT Angio Head & Neck/ CT Cerebral Perfusion: IMPRESSION: 1. Severe proximal left M2 stenosis and occlusion of a distal left M2/proximal M3 vessel. 2. Left frontoparietal core infarct with 19 mL penumbra. 3. Moderate proximal right PCA stenosis. 4. Widely patent cervical carotid and vertebral arteries.  MRI Head: IMPRESSION: 1. Small acute left MCA infarct. 2. Chronic left PICA and right parieto-occipital infarcts.  EKG: Sinus rhythm RVH with secondary repolarization abnormality   Physical Exam: Blood pressure 129/89, pulse 68, temperature 97.6 F (36.4 C), temperature source Oral, resp. rate 16, SpO2 96 %.  Physical Exam Vitals signs reviewed.  Constitutional:      General: He is not in acute distress.    Appearance: He is obese. He is ill-appearing.  HENT:     Head: Normocephalic.     Mouth/Throat:     Mouth: Mucous membranes are dry.  Eyes:     Pupils: Pupils are equal, round, and reactive to light.     Comments: Bilateral scleral injection  Cardiovascular:     Rate and Rhythm: Normal rate and regular rhythm.     Pulses: Normal pulses.     Heart sounds: Normal heart sounds. No murmur. No friction rub. No gallop.   Pulmonary:     Effort: Pulmonary effort is normal. No respiratory distress.     Breath sounds: Normal breath sounds. No wheezing or rales.  Abdominal:     General: Bowel sounds are normal. There is no distension.     Palpations: Abdomen is soft.     Tenderness: There is no abdominal tenderness. There is no guarding.  Psychiatric:        Mood and Affect: Mood normal.   Neuro: Mental Status: Patient is awake, alert, oriented x3 Slurred speech present. Cranial Nerves: II: Pupils equal, round, and reactive to light.   III,IV, VI: EOMI without ptosis or diploplia.  V: Facial sensation is symmetric to light touch  VII: Facial movement is asymmetric, R sided facial droop  VIII: Hearing is intact to voice X: Uvula elevates  symmetrically XI: Shoulder shrug is symmetric. XII: Tongue is midline without atrophy or fasciculations, moves L and R Motor: RUE: Shoulder 5/5, elbow 5/5 flexion & extension, wrist 5/5 flexion,  4/5 extension, 3/5 finger abduction, grip strength 5/5  LUE: 5/5 diffusely  RLE: 5/5 diffusely  LLE: 5/5 diffusely  Sensory: Sensation is grossly intact in bilateral UEs & LEs Deep Tendon Reflexes: 2+ throughout  Plantars: Toes are downgoing bilaterally. Cerebellar: Heel-Shin intact bilaterally, Dysmetria with finger to nose on R  Assessment & Plan by Problem: Active Problems:   CVA (cerebral vascular  accident) [facility])  In summary, Douglas [name] is a 33 year old male with a history of CHF (EF 55-60% last week) on 2 L O2 at baseline, HTN, polysubstance abuse, obesity, and OSA who presented with left-sided weakness slurred speech and facial droop, and head imaging notable for M2/M3 vessel occlusion consistent with an acute stroke. Neurology is currently following the patient and he is under the secondary stroke work-up protocol.    #CVA: L sided M2/M3 occlusion appreciated on CT angio of the head.  MRI significant for small acute left MCA infarct.  Neurology is on board and we will appreciate the recommendations.   - A1c, lipid panel, hypercoagulability studies ordered (homocysteine, lupus anticoagulant, beta-2-glycoprotein antibodies, cardiolipin antibodies)  - Echocardiogram ordered - Aspirin 325 mg daily - PT/OT/SLP consults ordered  #HTN #CHF: (EF of 55-60% at cardiology visit last week) patient is on 2 L of oxygen at baseline.  Patient takes carvedilol 6.5 mg daily, hydralazine 25 mg daily, metolazone 2.5 mg daily at home.  Also started on torsemide 60 mg daily at last heart failure clinic visit a few days ago. - Holding home carvedilol, hydralazine, metolazone, and torsemide in the setting of an acute stroke. - IV hydralazine 5 mg every 4 hours as needed for BP 123XX123 systolic or 123XX123 diastolic  - Supplemental O2 PRN. currently on 2L   #FEN/GI - IV fluids: None - Diet: NPO  #DVT prophylaxis  - Lovenox 40 mg subq injection daily  #Code status: FULL  #Dispo: Admit patient to Inpatient with expected length of stay greater than 2 midnights.  Signed: Earlene Plater, MD Internal Medicine, PGY1 Pager: [phone]  10/14/2018,9:58 AM

## 2018-10-14 NOTE — ED Triage Notes (Signed)
Pt arrives to ED from home with complaints of right sided weakness and slurred speech upon waking up this morning at 0530. LKW @ 2300 last night. Hx of CHF, no thinners. BP 142/100, HR:60. Pt SpO2 upon EMS arrival was 79%RA. Pt placed on NRB

## 2018-10-14 NOTE — ED Notes (Signed)
Patient transported to MRI 

## 2018-10-14 NOTE — Progress Notes (Signed)
STROKE TEAM PROGRESS NOTE   INTERVAL HISTORY His sister is at the bedside.  Pt still has significant right facial droop and slurred speech, however right arm and leg weakness much improved.  OBJECTIVE Vitals:   10/14/18 1135 10/14/18 1200 10/14/18 1400 10/14/18 1700  BP:  136/80 120/68 (!) 142/69  Pulse:      Resp:  11 16 (!) 23  Temp:  98 F (36.7 C) 98.5 F (36.9 C) 98.9 F (37.2 C)  TempSrc:  Oral Oral Oral  SpO2:  94% 94% 93%  Weight: 123.6 kg     Height: 5\' 3"  (1.6 m)       CBC:  Recent Labs  Lab 10/09/18 1418 10/14/18 0649 10/14/18 0651  WBC 8.0 7.8  --   NEUTROABS  --  5.3  --   HGB 18.4* 18.2* 21.8*  HCT 64.4* 61.1* 64.0*  MCV 101.1* 98.5  --   PLT 148* 161  --     Basic Metabolic Panel:  Recent Labs  Lab 10/11/18 1037 10/14/18 0649 10/14/18 0651  NA 139 140 140  K 5.5* 4.6 4.4  CL 85* 89* 89*  CO2 44* 37*  --   GLUCOSE 91 121* 115*  BUN 41* 27* 39*  CREATININE 1.61* 1.83* 1.60*  CALCIUM 9.0 8.4*  --     Lipid Panel:     Component Value Date/Time   CHOL 124 10/14/2018 0649   TRIG 59 10/14/2018 0649   HDL 35 (L) 10/14/2018 0649   CHOLHDL 3.5 10/14/2018 0649   VLDL 12 10/14/2018 0649   LDLCALC 77 10/14/2018 0649   HgbA1c:  Lab Results  Component Value Date   HGBA1C 5.6 10/14/2018   Urine Drug Screen:     Component Value Date/Time   LABOPIA NONE DETECTED 10/14/2018 1217   COCAINSCRNUR NONE DETECTED 10/14/2018 1217   LABBENZ NONE DETECTED 10/14/2018 1217   AMPHETMU NONE DETECTED 10/14/2018 1217   THCU NONE DETECTED 10/14/2018 1217   LABBARB NONE DETECTED 10/14/2018 1217    Alcohol Level     Component Value Date/Time   ETH <5 04/14/2015 1020    IMAGING  Ct Angio Head W Or Wo Contrast Ct Angio Neck W Or Wo Contrast Ct Cerebral Perfusion W Contrast 10/14/2018 IMPRESSION:  1. Severe proximal left M2 stenosis and occlusion of a distal left M2/proximal M3 vessel.  2. Left frontoparietal core infarct with 19 mL penumbra.  3.  Moderate proximal right PCA stenosis.  4. Widely patent cervical carotid and vertebral arteries.   Douglas Brain Wo Contrast 10/14/2018 IMPRESSION:  1. Small acute left MCA infarct.  2. Chronic left PICA and right parieto-occipital infarcts.   Ct Head Code Stroke Wo Contrast 10/14/2018 IMPRESSION:  1. Old left cerebellar infarct.  2. ASPECTS is 10.   Vas [place] Lower Extremity Venous (dvt) 10/14/2018 Summary:  Right: There is no evidence of deep vein thrombosis in the lower extremity.  Left: There is no evidence of deep vein thrombosis in the lower extremity.   Preliminary     Transthoracic Echocardiogram  Pending   ECG - SR rate 68 BPM. (See cardiology reading for complete details)   PHYSICAL EXAM  Temp:  [97.6 F (36.4 C)-99 F (37.2 C)] 98.9 F (37.2 C) (09/20 1700) Pulse Rate:  [66-68] 68 (09/20 0801) Resp:  [11-23] 23 (09/20 1700) BP: (120-172)/(68-104) 142/69 (09/20 1700) SpO2:  [85 %-100 %] 93 % (09/20 1700) Weight:  [123.6 kg] 123.6 kg (09/20 1135)  General - Morbid obesity, well developed,  in no apparent distress.  Ophthalmologic - fundi not visualized due to noncooperation.  Cardiovascular - Regular rate and rhythm.  Mental Status -  Level of arousal and orientation to time, place, and person were intact. Language including expression, naming, repetition, comprehension was assessed and found intact. Fund of Knowledge was assessed and was intact.  Cranial Nerves II - XII - II - Visual field intact OU. III, IV, VI - Extraocular movements intact. V - Facial sensation intact bilaterally. VII - right facial droop. VIII - Hearing & vestibular intact bilaterally. X - Palate elevates symmetrically. XI - Chin turning & shoulder shrug intact bilaterally. XII - Tongue protrusion intact.  Motor Strength - The patient's strength was normal in left upper and lower extremities however, right UE 4/5 proximal and distally with pronator drift and dexterity difficulty. Right  LE 5-/5 proximal and distally.  Bulk was normal and fasciculations were absent.   Motor Tone - Muscle tone was assessed at the neck and appendages and was normal.  Reflexes - The patient's reflexes were symmetrical in all extremities and he had no pathological reflexes.  Sensory - Light touch, temperature/pinprick were assessed and were symmetrical.    Coordination - The patient had normal movements in the hands but slow on the right with no ataxia or dysmetria.  Tremor was absent.  Gait and Station - deferred.    ASSESSMENT/PLAN Douglas. Lawernce [name]. is a 33 y.o. male with history of diastolic CHF on home oxygen, OSA, HTN, HLD, obesity, previous substance abuse hx, previous strokes by imaging presenting with right sided weakness. He did not receive IV t-PA due to late presentation (>4.5 hours from time of onset). Not felt to be an IR candidate d/t size of infarcted area.  Stroke: Patchy left MCA subcortical infarct - likely embolic -concerning for cardioembolic source given significant CHF  Resultant right facial droop, slurred speech, right mild hemiparesis  Code Stroke CT Head - Old left cerebellar infarct. ASPECTS is 10.   MRI head - Small acute left MCA infarct. Chronic left PICA and right parieto-occipital infarcts.   CTA H&N - Severe proximal left M2 stenosis and occlusion of a distal left M2/proximal M3 vessel. Moderate proximal right PCA stenosis.   CT Perfusion - Left frontoparietal core infarct with 19 mL penumbra.  Bilateral Lower Extremity Venous Dopplers - negative for DVT  Recommend TEE for further cardioembolic work-up  2D Echo - pending  Sars Corona Virus 2 negative  LDL - 77  HgbA1c - 5.6  UDS negative  Hypercoagulable work-up pending  VTE prophylaxis - Lovenox  No antithrombotic prior to admission, now on aspirin 325 mg daily and plavix 75 after plavix load.  Continue DAPT for 3 months and then aspirin alone.  Patient counseled to be compliant  with his antithrombotic medications  Ongoing aggressive stroke risk factor management  Therapy recommendations:  pending  Disposition:  Pending  Diastolic CHF with oxygen dependent  Followed by cardiology  Extensive CHF history with prominent RV failure  On home medication with Coreg, hydralazine and torsemide  TTE pending  Recommend TEE for further investigation  May consider loop recorder if needed  Hypertension  Home BP meds: Coreg ; Apresoline (Demadex ; Zaroxolyn)  Current BP meds: none  Stable . Permissive hypertension (OK if <180/105) but gradually normalize in 3-5 days  . Long-term BP goal normotensive  Hyperlipidemia  Home Lipid lowering medication:  none  LDL 77, goal < 70  Current lipid lowering medication: Lipitor 80  mg daily  Continue statin at discharge  Polycythemia  Persistently elevated hemoglobin   Hb - 18.4->18.2->21.8  Likely secondary to oxygen dependency and history of smoking  Other Stroke Risk Factors  Former cigarette smoker - has quit quit  Morbid obesity, Body mass index is 48.27 kg/m., recommend weight loss, diet and exercise as appropriate   Obstructive sleep apnea, on CPAP at home  Substance abuse history (cocaine)  Previous strokes by imaging - left cerebellum and right parieto-occipital old infarcts  Other Active Problems  Recommend UDS due to stroke in a young person and prior history of substance abuse (ordered)  CKD stage III - creatinine - 1.61->1.83->1.60  Hospital day # 0  Rosalin Hawking, MD PhD Stroke Neurology 10/14/2018 5:48 PM  To contact Stroke Continuity provider, please refer to [email or web address] After hours, contact General Neurology

## 2018-10-14 NOTE — Progress Notes (Signed)
Patient has arrived from ED; oriented to room and unit routine.

## 2018-10-14 NOTE — Progress Notes (Signed)
  Echocardiogram 2D Echocardiogram has been performed.  Douglas [name] 10/14/2018, 6:08 PM

## 2018-10-14 NOTE — Evaluation (Signed)
Physical Therapy Evaluation Patient Details Name: Douglas [name]. MRN: [id number] DOB: September 14, 1985 Today's Date: 10/14/2018   History of Present Illness  Douglas [name] is a 33 year old male with a past medical history significant for CHF (EF 55-60% last week) on 2 L O2 at baseline, HTN, polysubstance abuse, obesity, and OSA who presented to the ED today after experiencing right arm weakness, right leg weakness, and slurred speech when he woke up this morning. MRI showed L MCA infarct as well as chronic L posterior circulation infarcts  Clinical Impression  Pt admitted with above diagnosis. Pt presents with slurred speech and word finding difficulties as well as neuromuscular control deficits of the R upper and lower extremities. Mod A needed for safe ambulation with RW due to R knee buckling and ataxia. Would benefit from intense rehab environment.  Pt currently with functional limitations due to the deficits listed below (see PT Problem List). Pt will benefit from skilled PT to increase their independence and safety with mobility to allow discharge to the venue listed below.       Follow Up Recommendations CIR    Equipment Recommendations  Other (comment)(TBD)    Recommendations for Other Services Rehab consult     Precautions / Restrictions Precautions Precautions: Fall Restrictions Weight Bearing Restrictions: No      Mobility  Bed Mobility Overal bed mobility: Modified Independent                Transfers Overall transfer level: Needs assistance Equipment used: Rolling walker (2 wheeled);None Transfers: Sit to/from Stand Sit to Stand: Min assist         General transfer comment: min A to steady  Ambulation/Gait Ambulation/Gait assistance: Mod assist Gait Distance (Feet): 50 Feet Assistive device: Rolling walker (2 wheeled) Gait Pattern/deviations: Staggering right;Wide base of support;Decreased weight shift to right;Decreased dorsiflexion -  right;Ataxic Gait velocity: decreased Gait velocity interpretation: <1.31 ft/sec, indicative of household ambulator General Gait Details: pt with mild buckling of R knee and ataxic pattern, difficulty making turns with RW  Stairs            Wheelchair Mobility    Modified Rankin (Stroke Patients Only) Modified Rankin (Stroke Patients Only) Pre-Morbid Rankin Score: No symptoms Modified Rankin: Moderately severe disability     Balance Overall balance assessment: Needs assistance Sitting-balance support: No upper extremity supported Sitting balance-Leahy Scale: Good     Standing balance support: No upper extremity supported Standing balance-Leahy Scale: Poor Standing balance comment: can maintain static stance but cannot accept challenges or correct for LOB                             Pertinent Vitals/Pain Pain Assessment: No/denies pain    Home Living Family/patient expects to be discharged to:: Private residence Living Arrangements: Alone Available Help at Discharge: Family;Available 24 hours/day Type of Home: Apartment Home Access: Level entry     Home Layout: One level Home Equipment: Other (comment) Additional Comments: home oxygen    Prior Function Level of Independence: Independent         Comments: pt on disability from CHF     Hand Dominance   Dominant Hand: Left    Extremity/Trunk Assessment   Upper Extremity Assessment Upper Extremity Assessment: Defer to OT evaluation    Lower Extremity Assessment Lower Extremity Assessment: RLE deficits/detail RLE Deficits / Details: hip flex 4+/5, knee ext 4+/5 however, with function, pt demonstrates deficits in neuromuscular control including  R foot slap, poor foot placement in stance phase and mild R knee buckling RLE Sensation: decreased proprioception RLE Coordination: decreased gross motor    Cervical / Trunk Assessment Cervical / Trunk Assessment: Normal  Communication    Communication: Expressive difficulties(word finding and slurring)  Cognition Arousal/Alertness: Awake/alert Behavior During Therapy: WFL for tasks assessed/performed Overall Cognitive Status: Impaired/Different from baseline Area of Impairment: Problem solving                             Problem Solving: Difficulty sequencing;Requires verbal cues;Slow processing        General Comments      Exercises     Assessment/Plan    PT Assessment Patient needs continued PT services  PT Problem List Decreased balance;Decreased coordination;Decreased cognition;Decreased activity tolerance;Decreased knowledge of use of DME;Decreased safety awareness;Decreased knowledge of precautions;Obesity       PT Treatment Interventions DME instruction;Gait training;Stair training;Functional mobility training;Therapeutic activities;Therapeutic exercise;Balance training;Neuromuscular re-education;Cognitive remediation;Patient/family education    PT Goals (Current goals can be found in the Care Plan section)  Acute Rehab PT Goals Patient Stated Goal: get better PT Goal Formulation: With patient Time For Goal Achievement: 10/28/18 Potential to Achieve Goals: Good    Frequency Min 4X/week   Barriers to discharge Decreased caregiver support lives alone but family supportive and pt states he can have 23 assist    Co-evaluation               AM-PAC PT "6 Clicks" Mobility  Outcome Measure Help needed turning from your back to your side while in a flat bed without using bedrails?: None Help needed moving from lying on your back to sitting on the side of a flat bed without using bedrails?: None Help needed moving to and from a bed to a chair (including a wheelchair)?: A Little Help needed standing up from a chair using your arms (e.g., wheelchair or bedside chair)?: A Little Help needed to walk in hospital room?: A Lot Help needed climbing 3-5 steps with a railing? : A Lot 6 Click  Score: 18    End of Session Equipment Utilized During Treatment: Gait belt Activity Tolerance: Patient tolerated treatment well Patient left: in chair;with call bell/phone within reach Nurse Communication: Mobility status PT Visit Diagnosis: Unsteadiness on feet (R26.81);Ataxic gait (R26.0);Difficulty in walking, not elsewhere classified (R26.2)    Time: [id number] PT Time Calculation (min) (ACUTE ONLY): 30 min   Charges:   PT Evaluation $PT Eval Moderate Complexity: 1 Mod PT Treatments $Gait Training: 8-22 mins        Leighton Roach, [place]  Pager [phone] Office [place] 10/14/2018, 4:24 PM

## 2018-10-14 NOTE — ED Provider Notes (Signed)
[facility] EMERGENCY DEPARTMENT Provider Note   CSN: [id number] Arrival date & time: 10/14/18  0604     History   Chief Complaint No chief complaint on file.   HPI Douglas Urrego. is a 33 y.o. male.     HPI  This is a 33 year old male with a history of congestive heart failure, hypertension, hypercholesterolemia, substance abuse who presents as a code stroke.  Per EMS, patient was last seen normal at 11 PM.  He woke up with right-sided deficits including right face, right arm, right leg.  At initial evaluation, he had weakness right upper and lower extremity with slight right-sided facial droop and some dysarthria.  He was cleared for CT scan.  Neurology is at the bedside.  Level 5 caveat for acuity of condition  Past Medical History:  Diagnosis Date  . Arthritis    "hands, feet" (10/31/2013)  . CHF (congestive heart failure) ([place])   . Chronic bronchitis ([place])   . High cholesterol   . Hypertension   . Obesity   . OSA on CPAP   . Pneumonia 09/2011  . Substance abuse ([place]) 8/31/213   cociane "first time use"  . Tobacco abuse     Patient Active Problem List   Diagnosis Date Noted  . Morbid obesity ([place]) 06/17/2015  . Essential hypertension 04/14/2015  . Hyperkalemia 04/14/2015  . OSA (obstructive sleep apnea)   . Difficult intravenous access   . Chronic diastolic CHF (congestive heart failure), NYHA class 3 ([place]) 09/13/2014  . Cor pulmonale ([place]) 03/19/2014  . OSA treated with BiPAP     Past Surgical History:  Procedure Laterality Date  . NO PAST SURGERIES    . TUBES IN EARS          Home Medications    Prior to Admission medications   Medication Sig Start Date End Date Taking? Authorizing Provider  albuterol (PROVENTIL) 2 MG tablet Take 2 mg by mouth 3 (three) times daily.    [provider]  carvedilol (COREG) 6.25 MG tablet TAKE 1 AND 1/2 TABLET BY MOUTH TWICE A DAY 04/10/18   Larey Dresser, MD  hydrALAZINE  (APRESOLINE) 25 MG tablet Take 1 tablet (25 mg total) by mouth 3 (three) times daily. 06/22/18   Larey Dresser, MD  metolazone (ZAROXOLYN) 2.5 MG tablet Take 1 tablet (2.5 mg total) by mouth as needed. Patient not taking: Reported on 04/13/2018 03/16/18   Darrick Grinder D, NP  OXYGEN Inhale 2 L/min into the lungs continuous.    [provider]  PROVENTIL HFA 108 (90 Base) MCG/ACT inhaler INHALE 1 TO 2 PUFFS BY MOUTH EVERY 6 (SIX) HOURS AS NEEDED FOR WHEEZING OR SHORTNESS OF BREATH 07/24/18   [name], MD  torsemide (DEMADEX) 20 MG tablet Take 3 tablets (60 mg total) by mouth daily. 10/12/18   Conrad Valier, NP    Family History Family History  Problem Relation Age of Onset  . Heart disease Mother        Pacemaker  . Hypertension Father   . Cancer Paternal Grandfather   . Heart attack Neg Hx   . Stroke Neg Hx     Social History Social History   Tobacco Use  . Smoking status: Former Smoker    Packs/day: 1.50    Years: 3.00    Pack years: 4.50    Types: Cigarettes    Quit date: 10/31/2013    Years since quitting: 4.9  . Smokeless tobacco:  Never Used  Substance Use Topics  . Alcohol use: No    Alcohol/week: 1.0 standard drinks    Types: 1 Cans of beer per week  . Drug use: No    Types: Cocaine    Comment: 10/31/2013 "might use cocaine once/month" - Has not used since 2015.      Allergies   Lisinopril   Review of Systems Review of Systems  Unable to perform ROS: Acuity of condition     Physical Exam Updated Vital Signs BP (!) 172/104 (BP Location: Right Arm)   Pulse 66   Temp 97.6 F (36.4 C) (Oral)   Resp (!) 21   SpO2 (!) 85%   Physical Exam Vitals signs and nursing note reviewed.  Constitutional:      Appearance: He is well-developed. He is obese.     Comments: Chronically ill-appearing, obese, ABCs intact  HENT:     Head: Normocephalic and atraumatic.  Neck:     Musculoskeletal: Neck supple.  Cardiovascular:     Rate and Rhythm: Normal rate and  regular rhythm.  Pulmonary:     Effort: Pulmonary effort is normal. No respiratory distress.  Abdominal:     General: Bowel sounds are normal.     Tenderness: There is no abdominal tenderness. There is no rebound.  Musculoskeletal:     Comments: Trace bilateral lower extremity edema  Skin:    General: Skin is warm and dry.  Neurological:     Mental Status: He is alert and oriented to person, place, and time.     Comments: Slight right-sided facial droop, dysarthria noted, 3 out of 5 strength right upper extremity, 4 out of 5 strength right lower extremity,  Psychiatric:        Mood and Affect: Mood normal.      ED Treatments / Results  Labs (all labs ordered are listed, but only abnormal results are displayed) Labs Reviewed  I-STAT CHEM 8, ED - Abnormal; Notable for the following components:      Result Value   Chloride 89 (*)    BUN 39 (*)    Creatinine, Ser 1.60 (*)    Glucose, Bld 115 (*)    Calcium, Ion 1.02 (*)    TCO2 44 (*)    Hemoglobin 21.8 (*)    HCT 64.0 (*)    All other components within normal limits  CBG MONITORING, ED - Abnormal; Notable for the following components:   Glucose-Capillary 116 (*)    All other components within normal limits  PROTIME-INR  APTT  CBC  DIFFERENTIAL  COMPREHENSIVE METABOLIC PANEL    EKG None  Radiology Ct Head Code Stroke Wo Contrast  Result Date: 10/14/2018 CLINICAL DATA:  Code stroke. Left-sided weakness with facial droop. Last seen normal 11 p.m. EXAM: CT HEAD WITHOUT CONTRAST TECHNIQUE: Contiguous axial images were obtained from the base of the skull through the vertex without intravenous contrast. COMPARISON:  04/14/2015 FINDINGS: Brain: There is no mass, hemorrhage or extra-axial collection. The size and configuration of the ventricles and extra-axial CSF spaces are normal. There is an old left cerebellar infarct. Vascular: No abnormal hyperdensity of the major intracranial arteries or dural venous sinuses. No  intracranial atherosclerosis. Skull: The visualized skull base, calvarium and extracranial soft tissues are normal. Sinuses/Orbits: No fluid levels or advanced mucosal thickening of the visualized paranasal sinuses. No mastoid or middle ear effusion. The orbits are normal. ASPECTS [facility] Stroke Program Early CT Score) - Ganglionic level infarction (caudate, lentiform nuclei, internal capsule,  insula, M1-M3 cortex): 7 - Supraganglionic infarction (M4-M6 cortex): 3 Total score (0-10 with 10 being normal): 10 IMPRESSION: 1. Old left cerebellar infarct. 2. ASPECTS is 10. These results were communicated to Dr. Roland Rack at 6:22 am on 10/14/2018 by text page via the [facility] messaging system. Electronically Signed   By: Ulyses Jarred M.D.   On: 10/14/2018 06:23    Procedures Procedures (including critical care time)  Medications Ordered in ED Medications  sodium chloride flush (NS) 0.9 % injection 3 mL (has no administration in time range)  iohexol (OMNIPAQUE) 350 MG/ML injection 100 mL (100 mLs Intravenous Contrast Given 10/14/18 0707)     Initial Impression / Assessment and Plan / ED Course  I have reviewed the triage vital signs and the nursing notes.  Pertinent labs & imaging results that were available during my care of the patient were reviewed by me and considered in my medical decision making (see chart for details).        Patient presents as a code stroke.  Right-sided deficits and dysarthria.  Neurology at the bedside.  Patient cleared for CT scan.  He is out of the window for TPA.  It took some time to get a CTA because of access issues.  He has an old cerebellar stroke on his CT scan.  Per neurology, he is out of the window for any intervention.  They suspect an ischemic stroke.  Patient will need to be admitted for physical therapy and MRI.  Will consult admitting team.   Final Clinical Impressions(s) / ED Diagnoses   Final diagnoses:  Cerebrovascular accident (CVA),  unspecified mechanism [facility]    ED Discharge Orders    None       , Barbette Hair, MD 10/14/18 [phone]

## 2018-10-15 ENCOUNTER — Inpatient Hospital Stay (HOSPITAL_COMMUNITY): Payer: Medicaid Other

## 2018-10-15 ENCOUNTER — Inpatient Hospital Stay (HOSPITAL_COMMUNITY): Payer: Medicaid Other | Admitting: Certified Registered Nurse Anesthetist

## 2018-10-15 ENCOUNTER — Encounter (HOSPITAL_COMMUNITY)
Admission: EM | Disposition: A | Discharge status: Home / Self Care | Payer: Self-pay | Source: Home / Self Care | Attending: Internal Medicine

## 2018-10-15 ENCOUNTER — Encounter (HOSPITAL_COMMUNITY): Payer: Self-pay

## 2018-10-15 DIAGNOSIS — R130 Aphagia: Secondary | ICD-10-CM

## 2018-10-15 DIAGNOSIS — I639 Cerebral infarction, unspecified: Secondary | ICD-10-CM

## 2018-10-15 DIAGNOSIS — I6300 Cerebral infarction due to thrombosis of unspecified precerebral artery: Secondary | ICD-10-CM

## 2018-10-15 DIAGNOSIS — Q211 Atrial septal defect: Secondary | ICD-10-CM

## 2018-10-15 DIAGNOSIS — I2781 Cor pulmonale (chronic): Secondary | ICD-10-CM

## 2018-10-15 HISTORY — PX: TEE WITHOUT CARDIOVERSION: SHX5443

## 2018-10-15 HISTORY — PX: BUBBLE STUDY: SHX6837

## 2018-10-15 LAB — CBC
HCT: 61.6 % — ABNORMAL HIGH (ref 39.0–52.0)
Hemoglobin: 18 g/dL — ABNORMAL HIGH (ref 13.0–17.0)
MCH: 28.8 pg (ref 26.0–34.0)
MCHC: 29.2 g/dL — ABNORMAL LOW (ref 30.0–36.0)
MCV: 98.7 fL (ref 80.0–100.0)
Platelets: DECREASED 10*3/uL (ref 150–400)
RBC: 6.24 MIL/uL — ABNORMAL HIGH (ref 4.22–5.81)
RDW: 15.8 % — ABNORMAL HIGH (ref 11.5–15.5)
WBC: 7.1 10*3/uL (ref 4.0–10.5)
nRBC: 0 % (ref 0.0–0.2)

## 2018-10-15 LAB — BASIC METABOLIC PANEL
Anion gap: 10 (ref 5–15)
BUN: 17 mg/dL (ref 6–20)
CO2: 39 mmol/L — ABNORMAL HIGH (ref 22–32)
Calcium: 9.1 mg/dL (ref 8.9–10.3)
Chloride: 92 mmol/L — ABNORMAL LOW (ref 98–111)
Creatinine, Ser: 1.45 mg/dL — ABNORMAL HIGH (ref 0.61–1.24)
GFR calc Af Amer: >60 mL/min (ref >60)
GFR calc non Af Amer: >60 mL/min (ref >60)
Glucose, Bld: 109 mg/dL — ABNORMAL HIGH (ref 70–99)
Potassium: 3.9 mmol/L (ref 3.5–5.1)
Sodium: 141 mmol/L (ref 135–145)

## 2018-10-15 LAB — TSH: TSH: 0.419 u[IU]/mL (ref 0.350–4.500)

## 2018-10-15 LAB — LUPUS ANTICOAGULANT PANEL
DRVVT: 40.7 s (ref 0.0–47.0)
PTT Lupus Anticoagulant: 35.2 s (ref 0.0–51.9)

## 2018-10-15 LAB — FOLATE: Folate: 8.3 ng/mL (ref >5.9)

## 2018-10-15 LAB — VITAMIN B12: Vitamin B-12: 565 pg/mL (ref 180–914)

## 2018-10-15 LAB — HIV ANTIBODY (ROUTINE TESTING W REFLEX): HIV Screen 4th Generation wRfx: NONREACTIVE

## 2018-10-15 SURGERY — ECHOCARDIOGRAM, TRANSESOPHAGEAL
Anesthesia: Monitor Anesthesia Care

## 2018-10-15 MED ORDER — TORSEMIDE 20 MG PO TABS
80.0000 mg | ORAL_TABLET | Freq: Every day | ORAL | Status: DC
  Administered 2018-10-16 – 2018-10-17 (×2): 80 mg via ORAL
  Filled 2018-10-15 (×2): qty 4

## 2018-10-15 MED ORDER — CARVEDILOL 6.25 MG PO TABS
6.2500 mg | ORAL_TABLET | Freq: Two times a day (BID) | ORAL | Status: DC
  Administered 2018-10-16 – 2018-10-17 (×3): 6.25 mg via ORAL
  Filled 2018-10-15 (×3): qty 1

## 2018-10-15 MED ORDER — PROPOFOL 500 MG/50ML IV EMUL
INTRAVENOUS | Status: DC | PRN
  Administered 2018-10-15: 125 ug/kg/min via INTRAVENOUS

## 2018-10-15 MED ORDER — TORSEMIDE 20 MG PO TABS
60.0000 mg | ORAL_TABLET | Freq: Every day | ORAL | Status: DC
  Administered 2018-10-15: 60 mg via ORAL
  Filled 2018-10-15: qty 3

## 2018-10-15 MED ORDER — HYDRALAZINE HCL 20 MG/ML IJ SOLN: 5.0000 mg | INTRAMUSCULAR | Status: DC | PRN

## 2018-10-15 MED ORDER — HYDRALAZINE HCL 20 MG/ML IJ SOLN
5.0000 mg | INTRAMUSCULAR | Status: DC | PRN
  Administered 2018-10-15: 5 mg via INTRAVENOUS
  Filled 2018-10-15: qty 1

## 2018-10-15 MED ORDER — ASPIRIN EC 81 MG PO TBEC: 81.0000 mg | DELAYED_RELEASE_TABLET | Freq: Every day | ORAL | Status: DC

## 2018-10-15 MED ORDER — PROPOFOL 10 MG/ML IV BOLUS
INTRAVENOUS | Status: DC | PRN
  Administered 2018-10-15: 30 mg via INTRAVENOUS

## 2018-10-15 MED ORDER — APIXABAN 5 MG PO TABS
5.0000 mg | ORAL_TABLET | Freq: Two times a day (BID) | ORAL | Status: DC
  Administered 2018-10-15 – 2018-10-17 (×4): 5 mg via ORAL
  Filled 2018-10-15 (×4): qty 1

## 2018-10-15 MED ORDER — LIDOCAINE 2% (20 MG/ML) 5 ML SYRINGE
INTRAMUSCULAR | Status: DC | PRN
  Administered 2018-10-15: 80 mg via INTRAVENOUS

## 2018-10-15 MED ORDER — SODIUM CHLORIDE 0.9 % IV SOLN
INTRAVENOUS | Status: DC
  Administered 2018-10-15: 12:00:00 via INTRAVENOUS

## 2018-10-15 NOTE — Progress Notes (Addendum)
STROKE TEAM PROGRESS NOTE   INTERVAL HISTORY His sister is at the bedside.  Pt sitting in chair. Right facial droop improved. Pt had TEE done showed significantly enlarged right heart with moderate PFO. Cardiology consulted and concerning for paradoxical emboli. Recommend eliquis and PFO closure.   OBJECTIVE Vitals:   10/14/18 1810 10/14/18 2029 10/14/18 2323 10/15/18 0320  BP: (!) 150/99 (!) 136/91 (!) 138/101 139/88  Pulse: 60 66 68 (!) 54  Resp: 18 18 17 15   Temp: 99.1 F (37.3 C) 99.5 F (37.5 C) 97.9 F (36.6 C) 98.9 F (37.2 C)  TempSrc: Oral Oral Oral Oral  SpO2: 95% 98% 99% 97%  Weight:      Height:        CBC:  Recent Labs  Lab 10/14/18 0649 10/14/18 0651 10/15/18 0353  WBC 7.8  --  7.1  NEUTROABS 5.3  --   --   HGB 18.2* 21.8* 18.0*  HCT 61.1* 64.0* 61.6*  MCV 98.5  --  98.7  PLT 161  --  PLATELET CLUMPS NOTED ON SMEAR, COUNT APPEARS DECREASED    Basic Metabolic Panel:  Recent Labs  Lab 10/14/18 0649 10/14/18 0651 10/15/18 0353  NA 140 140 141  K 4.6 4.4 3.9  CL 89* 89* 92*  CO2 37*  --  39*  GLUCOSE 121* 115* 109*  BUN 27* 39* 17  CREATININE 1.83* 1.60* 1.45*  CALCIUM 8.4*  --  9.1    Lipid Panel:     Component Value Date/Time   CHOL 124 10/14/2018 0649   TRIG 59 10/14/2018 0649   HDL 35 (L) 10/14/2018 0649   CHOLHDL 3.5 10/14/2018 0649   VLDL 12 10/14/2018 0649   LDLCALC 77 10/14/2018 0649   HgbA1c:  Lab Results  Component Value Date   HGBA1C 5.6 10/14/2018   Urine Drug Screen:     Component Value Date/Time   LABOPIA NONE DETECTED 10/14/2018 1217   COCAINSCRNUR NONE DETECTED 10/14/2018 1217   LABBENZ NONE DETECTED 10/14/2018 1217   AMPHETMU NONE DETECTED 10/14/2018 1217   THCU NONE DETECTED 10/14/2018 1217   LABBARB NONE DETECTED 10/14/2018 1217    Alcohol Level     Component Value Date/Time   ETH <5 04/14/2015 1020    IMAGING  Ct Angio Head W Or Wo Contrast Ct Angio Neck W Or Wo Contrast Ct Cerebral Perfusion W  Contrast 10/14/2018 IMPRESSION:  1. Severe proximal left M2 stenosis and occlusion of a distal left M2/proximal M3 vessel.  2. Left frontoparietal core infarct with 19 mL penumbra.  3. Moderate proximal right PCA stenosis.  4. Widely patent cervical carotid and vertebral arteries.   Mr Brain Wo Contrast 10/14/2018 IMPRESSION:  1. Small acute left MCA infarct.  2. Chronic left PICA and right parieto-occipital infarcts.   Ct Head Code Stroke Wo Contrast 10/14/2018 IMPRESSION:  1. Old left cerebellar infarct.  2. ASPECTS is 10.   Vas [place] Lower Extremity Venous (dvt) 10/14/2018 Summary:  Right: There is no evidence of deep vein thrombosis in the lower extremity.  Left: There is no evidence of deep vein thrombosis in the lower extremity.   Preliminary     TEE   IMPRESSIONS  1. Left ventricular ejection fraction, by visual estimation, is 55%. The left ventricle has normal function. Normal left ventricular size. There is moderately increased left ventricular hypertrophy.  2. Global right ventricle has moderately reduced systolic function.The right ventricular size is severely enlarged. Mildly increased right ventricular wall thickness. D-shaped septum suggestive  of RV pressure/volume overload.  3. Trivial pericardial effusion is present.  4. The tricuspid valve is normal in structure. Tricuspid valve regurgitation is mild. Peak RV-RA gradient 38 mmHg.  5. Mild left atrial enlargement. There turbulent flow into the left atrium adjacent to the right upper pulmonary vein. The flow is continous by CW doppler but does not appear to be an ASD with right to left shunting (does not originate from the RA). It possibly represents ? a compressed pulmonary vein. Needs further evaluation.  6. Evidence of atrial level shunting detected by color flow Doppler. Patient has a moderate PFO with bidirectional flow. Positive bubble study.  7. Severe right atrial enlargement. There is spontaneous contrast noted in  the RA suggesive of low flow state and risk of thrombosis.  8. The aortic root was not well visualized.  9. The aortic valve is tricuspid Aortic valve regurgitation was not visualized by color flow Doppler. Structurally normal aortic valve, with no evidence of sclerosis or stenosis. 10. The mitral valve is normal in structure. Trace mitral valve regurgitation. No evidence of mitral stenosis. 11. The pulmonic valve was normal in structure. Pulmonic valve regurgitation is not visualized by color flow Doppler.  ECG - SR rate 68 BPM. (See cardiology reading for complete details)   PHYSICAL EXAM  Temp:  [97.9 F (36.6 C)-99.5 F (37.5 C)] 98.9 F (37.2 C) (09/21 0320) Pulse Rate:  [54-68] 54 (09/21 0320) Resp:  [11-23] 15 (09/21 0320) BP: (120-150)/(68-101) 139/88 (09/21 0320) SpO2:  [93 %-99 %] 97 % (09/21 0320) Weight:  [123.6 kg] 123.6 kg (09/20 1135)  General - Morbid obesity, well developed, in no apparent distress.  Ophthalmologic - fundi not visualized due to noncooperation.  Cardiovascular - Regular rate and rhythm.  Mental Status -  Level of arousal and orientation to time, place, and person were intact. Language including expression, naming, repetition, comprehension was assessed and found intact. Fund of Knowledge was assessed and was intact.  Cranial Nerves II - XII - II - Visual field intact OU. III, IV, VI - Extraocular movements intact. V - Facial sensation intact bilaterally. VII - slight right facial droop. VIII - Hearing & vestibular intact bilaterally. X - Palate elevates symmetrically. XI - Chin turning & shoulder shrug intact bilaterally. XII - Tongue protrusion intact.  Motor Strength - The patient's strength was normal in left upper and lower extremities however, right UE 4+/5 proximal and distally with pronator drift and dexterity difficulty. Right LE 5-/5 proximal and distally.  Bulk was normal and fasciculations were absent.   Motor Tone - Muscle tone  was assessed at the neck and appendages and was normal.  Reflexes - The patient's reflexes were symmetrical in all extremities and he had no pathological reflexes.  Sensory - Light touch, temperature/pinprick were assessed and were symmetrical.    Coordination - The patient had normal movements in the hands but slow on the right with no ataxia or dysmetria.  Tremor was absent.  Gait and Station - deferred.    ASSESSMENT/PLAN [name]. is a 33 y.o. male with history of diastolic CHF on home oxygen, OSA, HTN, HLD, obesity, previous substance abuse hx, previous strokes by imaging presenting with right sided weakness. He did not receive IV t-PA due to late presentation (>4.5 hours from time of onset). Not felt to be an IR candidate d/t size of infarcted area.  Stroke: Patchy left MCA subcortical infarct - likely paradoxical emboli given severely enlarged right heart with  moderate PFO  Resultant right facial droop, slurred speech, right mild hemiparesis  Code Stroke CT Head - Old left cerebellar infarct. ASPECTS is 10.   MRI head - Small acute left MCA infarct. Chronic left PICA and right parieto-occipital infarcts.   CTA H&N - Severe proximal left M2 stenosis and occlusion of a distal left M2/proximal M3 vessel. Moderate proximal right PCA stenosis.   CT Perfusion - Left frontoparietal core infarct with 19 mL penumbra.  Bilateral Lower Extremity Venous Dopplers - negative for DVT TTE - EF 40-45% right heart severely enlarged   TEE - EF 55% - moderate PFO with severely enlarged right heart and spontaneous contrast in the RA.  [place] Corona Virus 2 negative  LDL - 77  HgbA1c - 5.6  UDS negative  Hypercoagulable work-up - pending  VTE prophylaxis - Lovenox  No antithrombotic prior to admission, now on aspirin 325 mg daily and plavix 75 after plavix load. Agree with eliquis as per cardiology, it is Ok from neuro standpoint to start anticoagulation as his stroke is  small with low risk of hemorrhagic conversion. Once eliquis started, ASA and plavix can be discontinued from neuro standpoint  Patient counseled to be compliant with his antithrombotic medications  Ongoing aggressive stroke risk factor management  Therapy recommendations:  pending  Disposition:  Pending  Diastolic CHF with oxygen dependent  Followed by cardiology  Extensive CHF history with prominent RV failure  On home medication with Coreg, hydralazine and torsemide TTE - - EF 40-45% right heart severely enlarged   TEE - EF 55% - moderate PFO with severely enlarged right heart and spontaneous contrast in the RA.  Agree with cardiology for anticoagulation with eliquis  Agree with cardiology to consider PFO closure  Hypertension  Home BP meds: Coreg ; Apresoline (Demadex ; Zaroxolyn)  Current BP meds: none  Stable . Permissive hypertension (OK if <180/105) but gradually normalize in 3-5 days  . Long-term BP goal normotensive  Hyperlipidemia  Home Lipid lowering medication:  none  LDL 77, goal < 70  Current lipid lowering medication: Lipitor 80 mg daily  Continue statin at discharge  Polycythemia  Persistently elevated hemoglobin   Hb - 18.4->18.2->21.8->18.0  Likely secondary to oxygen dependency and history of smoking  Other Stroke Risk Factors  Former cigarette smoker - has quit quit  Morbid obesity, Body mass index is 48.27 kg/m., recommend weight loss, diet and exercise as appropriate   Obstructive sleep apnea, on CPAP at home  Substance abuse history (cocaine)  Previous strokes by imaging - left cerebellum and right parieto-occipital old infarcts  Other Active Problems  Recommend UDS due to stroke in a young person and prior history of substance abuse (ordered)  CKD stage III - creatinine - 1.61->1.83->1.60->1.45  Hospital day # 1  Neurology will sign off. Please call with questions. Pt will follow up with stroke clinic NP at [facility] in about  4 weeks. Thanks for the consult.  Rosalin Hawking, MD PhD Stroke Neurology 10/15/2018 3:46 PM    To contact Stroke Continuity provider, please refer to [email or web address] After hours, contact General Neurology

## 2018-10-15 NOTE — Evaluation (Signed)
Speech Language Pathology Evaluation Patient Details Name: Douglas [name]. MRN: [id number] DOB: 12-30-1985 Today's Date: 10/15/2018 Time: [id number] SLP Time Calculation (min) (ACUTE ONLY): 26 min  Problem List:  Patient Active Problem List   Diagnosis Date Noted  . CVA (cerebral vascular accident) ([place]) 10/14/2018  . Morbid obesity ([place]) 06/17/2015  . Essential hypertension 04/14/2015  . Hyperkalemia 04/14/2015  . OSA (obstructive sleep apnea)   . Difficult intravenous access   . Chronic diastolic CHF (congestive heart failure), NYHA class 3 ([place]) 09/13/2014  . Cor pulmonale ([place]) 03/19/2014  . OSA treated with BiPAP    Past Medical History:  Past Medical History:  Diagnosis Date  . Arthritis    "hands, feet" (10/31/2013)  . CHF (congestive heart failure) ([place])   . Chronic bronchitis ([place])   . High cholesterol   . Hypertension   . Obesity   . OSA on CPAP   . Pneumonia 09/2011  . Substance abuse ([place]) 8/31/213   cociane "first time use"  . Tobacco abuse    Past Surgical History:  Past Surgical History:  Procedure Laterality Date  . NO PAST SURGERIES    . TUBES IN EARS     HPI:  Douglas [name] is a 33 year old male with a history of CHF (EF 55-60% last week) on 2 L O2 at baseline, HTN, polysubstance abuse, obesity, and OSA who presented with left-sided weakness slurred speech and facial droop, and head imaging notable for M2/M3 vessel occlusion consistent with an acute stroke. MRI significant for small acute left MCA infarct.     Assessment / Plan / Recommendation Clinical Impression  Pt reported that he was living independently prior to admission. He stated that he was on disability due to heart failure but did not have any baseline deficits in speech, language or cognition. He denied any acute changes in cognition but stated that he has been having some difficulty with word retrieval and that his speech is now "slurred".   He presents with mild aphasia  characterized by difficulty with auditory comprehension of complex information and word retrieval difficulty during conversation as well as more structured naming tasks. Mild-moderate dysarthria was also noted characterized by imprecise articulation secondary to oral motor weakness which negatively impacted speech intelligibility during conversation. Skilled SLP services are clinically indicated at this time to improve aphasia and dysarthria. Pt and nursing were educated regarding results and recommendations. Both parties verbalized understanding as well as agreement with plan of care.     SLP Assessment  SLP Recommendation/Assessment: Patient needs continued Speech Lanaguage Pathology Services SLP Visit Diagnosis: Aphasia (R47.01);Dysarthria and anarthria (R47.1)    Follow Up Recommendations  Inpatient Rehab    Frequency and Duration min 2x/week  2 weeks      SLP Evaluation Cognition  Overall Cognitive Status: Difficult to assess(Due to aphasia ) Arousal/Alertness: Awake/alert Orientation Level: Oriented X4 Attention: Focused;Sustained Focused Attention: Appears intact Sustained Attention: Appears intact Memory: Impaired Memory Impairment: Retrieval deficit;Storage deficit;Decreased recall of new information(Immediate: 3/3; delayed: 1/3; with cue: 0/2) Awareness: Appears intact Problem Solving: Impaired Problem Solving Impairment: Verbal complex(May have been impacted by auditory comprehension)       Comprehension  Auditory Comprehension Overall Auditory Comprehension: Impaired Basic Biographical Questions: (5/5) Complex Questions: (2/5) Paragraph Comprehension (via yes/no questions): (2/4) Commands: Impaired Two Step Basic Commands: (4/4) Multistep Basic Commands: (3/4) Conversation: Complex Reading Comprehension Reading Status: Within funtional limits    Expression Expression Primary Mode of Expression: Verbal Verbal Expression Overall Verbal Expression:  Impaired  Initiation: Impaired Automatic Speech: Counting;Day of week;Month of year(With additional processing time ) Level of Generative/Spontaneous Verbalization: Conversation Repetition: No impairment(5/5) Naming: Impairment Responsive: (3/5) Confrontation: Within functional limits(10/10) Convergent: (Sentence completion: 5/5) Pragmatics: No impairment Effective Techniques: Other (Comment)(Additional processing time )   Oral / Motor  Oral Motor/Sensory Function Overall Oral Motor/Sensory Function: Mild impairment Facial ROM: Reduced right;Suspected CN VII (facial) dysfunction Facial Symmetry: Abnormal symmetry right;Suspected CN VII (facial) dysfunction Facial Strength: Reduced right;Suspected CN VII (facial) dysfunction Facial Sensation: Within Functional Limits Lingual ROM: Within Functional Limits Motor Speech Overall Motor Speech: Impaired Respiration: Within functional limits Phonation: Normal Resonance: Within functional limits Articulation: Impaired Level of Impairment: Sentence Intelligibility: Intelligibility reduced Word: 75-100% accurate Phrase: 75-100% accurate Sentence: 50-74% accurate Conversation: 50-74% accurate Motor Planning: Witnin functional limits Motor Speech Errors: Aware;Consistent   Sheryl Towell I. Hardin Negus, [place], [place] Office number [phone] Pager [place] 10/15/2018, 10:29 AM

## 2018-10-15 NOTE — Progress Notes (Signed)
PT Cancellation Note  Patient Details Name: Douglas [name]. MRN: [id number] DOB: March 01, 1985   Cancelled Treatment:    Reason Eval/Treat Not Completed: Patient at procedure or test/unavailable   10/15/2018   Rande Lawman, PT    Douglas [name] 10/15/2018, 12:33 PM

## 2018-10-15 NOTE — Anesthesia Preprocedure Evaluation (Addendum)
Anesthesia Evaluation  Patient identified by MRN, date of birth, ID band Patient awake    Reviewed: Allergy & Precautions, NPO status , Patient's Chart, lab work & pertinent test results  Airway Mallampati: I  TM Distance: >3 FB Neck ROM: Full    Dental no notable dental hx. (+) Poor Dentition, Dental Advisory Given   Pulmonary sleep apnea (does not wear CPAP, 2L O2) , former smoker,    Pulmonary exam normal breath sounds clear to auscultation       Cardiovascular hypertension, Pt. on medications + CAD and +CHF  Normal cardiovascular exam Rhythm:Regular Rate:Normal  Echo 10/14/18  EF40-45%  BNP 392   Neuro/Psych CVA (right hand numbness and weakness), Residual Symptoms negative psych ROS   GI/Hepatic negative GI ROS, (+)     substance abuse  cocaine use,   Endo/Other  Morbid obesity  Renal/GU negative Renal ROSCr 1.45  negative genitourinary   Musculoskeletal  (+) Arthritis ,   Abdominal (+) + obese,   Peds  Hematology  (+) anemia , Hgb 10.0   Anesthesia Other Findings   Reproductive/Obstetrics                            Anesthesia Physical Anesthesia Plan  ASA: IV  Anesthesia Plan: MAC   Post-op Pain Management:    Induction: Intravenous  PONV Risk Score and Plan: 3 and Treatment may vary due to age or medical condition, Ondansetron and Dexamethasone  Airway Management Planned: Natural Airway and Nasal Cannula  Additional Equipment:   Intra-op Plan:   Post-operative Plan:   Informed Consent:     Dental advisory given  Plan Discussed with: Anesthesiologist and CRNA  Anesthesia Plan Comments:         Anesthesia Quick Evaluation

## 2018-10-15 NOTE — Progress Notes (Signed)
Rehab Admissions Coordinator Note:  Per PT recommendation, patient was screened by Michel Santee for appropriateness for an Inpatient Acute Rehab Consult.  At this time, we are recommending Inpatient Rehab consult.  I will page MD for consult.   Michel Santee 10/15/2018, 9:33 AM  I can be reached at [phone].

## 2018-10-15 NOTE — CV Procedure (Addendum)
Procedure: TEE  Sedation: Per anesthesiology.   Indication: CVA  Findings: Please see echo report.   I suspect that he may have had paradoxical embolism across PFO, would anticoagulate when stable for this from neurology's standpoint (would use Eliquis).  We will workup for PFO closure and will check lower leg venous dopplers.   No complications.   [name] 10/15/2018 1:03 PM

## 2018-10-15 NOTE — Progress Notes (Signed)
SLP Cancellation Note  Patient Details Name: Douglas [name]. MRN: [id number] DOB: 11-17-1985   Cancelled treatment:       Reason Eval/Treat Not Completed: Patient at procedure or test/unavailable(Pt requesting to go to the bathroom at this time.RN informed. SLP will follow up)  Geraldyn Shain I. Hardin Negus, [place], [place] Office number [phone] Pager [place] 10/15/2018, 8:35 AM

## 2018-10-15 NOTE — Progress Notes (Signed)
Transitions of Care Pharmacist Note  Fawaz Fidalgo. is a 33 y.o. male that has been diagnosed with CVA likely caused by PFO and will be prescribed Eliquis (apixaban) at discharge.   Patient Education: I provided the following education on 9/21 to the patient: How to take the medication Described what the medication is Signs of bleeding Signs/symptoms of VTE and stroke  Answered their questions  Discharge Medications Plan: The patient is not interested in filling their discharge medications with the Transitions of Care pharmacy at this time.   If the patient later decides they would like to have the Transitions of Care pharmacy fill their discharge medications, please call us at [phone]. Thank you.    Thank you,   Berenice Bouton, PharmD PGY1 Pharmacy Resident Office phone: [phone] October 15, 2018

## 2018-10-15 NOTE — Interval H&P Note (Signed)
History and Physical Interval Note:  10/15/2018 12:09 PM  Douglas D Lio Jr.  has presented today for surgery, with the diagnosis of stroke.  The various methods of treatment have been discussed with the patient and family. After consideration of risks, benefits and other options for treatment, the patient has consented to  Procedure(s): TRANSESOPHAGEAL ECHOCARDIOGRAM (TEE) (N/A) as a surgical intervention.  The patient's history has been reviewed, patient examined, no change in status, stable for surgery.  I have reviewed the patient's chart and labs.  Questions were answered to the patient's satisfaction.     Nate Common [employer]

## 2018-10-15 NOTE — Transfer of Care (Signed)
Immediate Anesthesia Transfer of Care Note  Patient: Douglas [name].  Procedure(s) Performed: TRANSESOPHAGEAL ECHOCARDIOGRAM (TEE) (N/A ) BUBBLE STUDY  Patient Location: Endoscopy Unit  Anesthesia Type:MAC  Level of Consciousness: drowsy and patient cooperative  Airway & Oxygen Therapy: Patient Spontanous Breathing and Patient connected to nasal cannula oxygen  Post-op Assessment: Report given to RN and Post -op Vital signs reviewed and stable  Post vital signs: Reviewed and stable  Last Vitals:  Vitals Value Taken Time  BP 166/105   Temp    Pulse 85   Resp 18   SpO2 96%     Last Pain:  Vitals:   10/15/18 1123  TempSrc: Temporal  PainSc: 0-No pain         Complications: No apparent anesthesia complications

## 2018-10-15 NOTE — Progress Notes (Signed)
Subjective: Patient seen at the bedside on rounds this morning. He was sitting up at the bedside chair working with the SLP. The patient says he feels good today and is very thankful that things were not worse. The weakness has improved, but he says he still has some intermittent numbness and tingling in his right upper extremity. He currently still has some mild slurred speech but feels like this is improved. No complaints at this time.   Objective:  Vital signs in last 24 hours: Vitals:   10/14/18 2029 10/14/18 2323 10/15/18 0320 10/15/18 0902  BP: (!) 136/91 (!) 138/101 139/88 (!) 148/119  Pulse: 66 68 (!) 54 63  Resp: 18 17 15    Temp: 99.5 F (37.5 C) 97.9 F (36.6 C) 98.9 F (37.2 C) 98.8 F (37.1 C)  TempSrc: Oral Oral Oral Oral  SpO2: 98% 99% 97% 97%  Weight:      Height:       Physical Exam: General: Sitting up in the bedside chair, NAD HEENT: NCAT, [place] in place CV: RRR, normal S1 and S2, no murmurs, rubs or gallops appreciated.  Distal pulses intact PULM: Clear to auscultation bilaterally, no crackles appreciated ABD: Nontender in all quadrants and nondistended  NEURO: Mental Status: Patient is awake, alert, oriented x3 Mild aphagia, No facial droop. Cranial Nerves: II: Pupils equal, round, and reactive to light.   III,IV, VI: EOMI without ptosis or diploplia.  V: Facial sensation is symmetric to light touch VII: Facial movement is symmetric.  VIII: hearing is intact to voice X: Uvula elevates symmetrically XI: Shoulder shrug is symmetric. XII: tongue is midline without atrophy or fasciculations.  Motor: 5/5 effort throughout upper and lower extremities Sensory: Sensation is grossly intact in bilateral UEs & LEs Cerebellar: Finger-Nose intact  Assessment/Plan:  Active Problems:   CVA (cerebral vascular accident) ([place])  In summary, [name] is a 33 year old male with a history of CHF (EF 55-60% last week) on 2 L O2 at baseline, HTN, polysubstance  abuse, obesity, and OSA who presented with left-sided weakness slurred speech and facial droop, and head imaging notable for M2/M3 vessel occlusion consistent with an acute stroke.  The patient has mild slurred speech but his left-sided weakness and facial droop has improved markedly.  Neurology is currently following the patient and he is under the secondary stroke work-up protocol.    #CVA: L sided M2/M3 occlusion appreciated on CT angio of the head.  MRI significant for small acute left MCA infarct.  Neurology is on board and we will appreciate the recommendations. TTE did not show PFO, TEE indicated. - Hypercoagulability studies pending (homocysteine, lupus anticoagulant, beta-2-glycoprotein antibodies, cardiolipin antibodies)  -TEE ordered - A1c m 5.6, metformin not indicated at this time - Atorvastatin 80 mg daily  - Aspirin 325 mg daily - PT/OT/SLP consults ordered and recommend CIR. CIR consult order has been placed  #HTN #CHF: (EF of 55-60% at cardiology visit last week) patient is on 2 L of oxygen at baseline.  Patient takes carvedilol 6.5 mg daily, hydralazine 25 mg daily, metolazone 2.5 mg daily at home.  Also started on torsemide 60 mg daily at last heart failure clinic visit a few days ago. - Holding home carvedilol, hydralazine, metolazone, and torsemide in the setting of an acute stroke. Will start antihypertensives on 9/22.  - IV hydralazine 5 mg every 4 hours as needed for BP [id number] systolic or [id number] diastolic per Neuro recs. - Supplemental O2 PRN. Currently on home 2L  #FEN/GI -  IV fluids: None - Diet: NPO  #DVT prophylaxis  - Lovenox 40 mg subq injection daily  #Code status: FULL  #Dispo: Admit patient to Inpatient with expected length of stay greater than 2 midnights.  Earlene Plater, MD Internal Medicine, PGY1 Pager: [phone]  10/15/2018,11:09 AM

## 2018-10-15 NOTE — Progress Notes (Signed)
Chaplain visited with patient to discuss advanced directives.  The chaplain also visited with the patient concerning his emotional well-being.  The patient disclosed that they were struggling emotionally with their current health challenges. The chaplain provided an empathetic ear and provided the patient with a Bible.  The chaplain also gave the patient information regarding advanced directives.  The patient will review with family and the chaplain will follow-up.  Brion Aliment Chaplain Resident For questions concerning this note please contact me by pager [phone]

## 2018-10-15 NOTE — Progress Notes (Signed)
    [facility] has been requested to perform a transesophageal echocardiogram on Douglas [name]. for Stroke.  After careful review of history and examination, the risks and benefits of transesophageal echocardiogram have been explained including risks of esophageal damage, perforation (1:10,000 risk), bleeding, pharyngeal hematoma as well as other potential complications associated with conscious sedation including aspiration, arrhythmia, respiratory failure and death. Alternatives to treatment were discussed, questions were answered. Patient is willing to proceed. He was anxious so I talked with his sister by phone at his request.  Ellison Hughs  They both agreed to proceed.     Pt does have sleep apnea.    Cecilie Kicks, NP  10/15/2018 10:49 AM

## 2018-10-15 NOTE — Progress Notes (Signed)
  Echocardiogram Echocardiogram Transesophageal has been performed.  Jannett Celestine 10/15/2018, 1:50 PM

## 2018-10-15 NOTE — Progress Notes (Signed)
  Speech Language Pathology Treatment: Cognitive-Linquistic(Aphasia )  Patient Details Name: Douglas [name]. MRN: [id number] DOB: 1985-12-23 Today's Date: 10/15/2018 Time: [id number] SLP Time Calculation (min) (ACUTE ONLY): 19 min  Assessment / Plan / Recommendation Clinical Impression  Pt was seen for aphasia and dysarthria treatment. He was alert and cooperative throughout the session without complaint of pain. He was educated regarding the nature of dysarthria and aphasia as well as compensatory strategies to improve speech intelligibility. Pt verbalized understanding regarding all areas of education and all his questions were answered. He used compensatory strategies at the sentence level with 80% accuracy increasing to 100% accuracy with min-mod cues for overarticulation. He required moderate cues during conversation. He provided 6-8 items per concrete category during divergent naming tasks. SLP will continue to follow pt.     HPI HPI: [name] is a 33 year old male with a history of CHF (EF 55-60% last week) on 2 L O2 at baseline, HTN, polysubstance abuse, obesity, and OSA who presented with left-sided weakness slurred speech and facial droop, and head imaging notable for M2/M3 vessel occlusion consistent with an acute stroke. MRI significant for small acute left MCA infarct.        SLP Plan  Continue with current plan of care  Patient needs continued Speech Lanaguage Pathology Services    Recommendations                   Follow up Recommendations: Inpatient Rehab SLP Visit Diagnosis: Aphasia (R47.01);Dysarthria and anarthria (R47.1) Plan: Continue with current plan of care       Douglas [name], [place], [place] Office number [phone] Pager [place] 10/15/2018, 10:36 AM

## 2018-10-15 NOTE — Consult Note (Signed)
Advanced Heart Failure Team Consult Note   Primary Physician: Medicine, [facility] PCP-Cardiologist:  No primary care provider on file.  Reason for Consultation: CVA, needs TEE. RV failure.   HPI:    Douglas [name]. is seen today for evaluation of CVA (needs TEE) at the request of Dr. [name] Coil.   Douglas [name] is a 33 yo with OHS/OSA, morbid obesity, HTN, diastolic CHF with prominent RV failure.   Admitted in August 2016  to [facility] with volume overload. Diuresed with lasix drip and diamox. Transitioned to lasix 80 mg twice a day. Discharge weight 257 pounds.   Admit to [facility] 3/21 through 04/28/15 with respiratory distress. Required short term intubation. Apparently had not been wearing BiPap. Discharge weight was 259 pounds.   He uses 2L home oxygen.  He does not use his CPAP and machine was taken away.  At last appointment, torsemide was changed to 80 mg daily.   He was admitted with right-sided weakness and slurred speech and found to have patchy left MCA subcortical infarct, likely embolic.  Due to late presentation, no t-PA and not felt to be IR candidate. CT also showed old left cerebellar infarct.   He had TEE done that showed severely dilated and moderately dysfunctional RV, severe RAE, spontaneous contrast in the RA with a moderate PFO and bidirectional shunting. There was also a turbulent flow jet entering the left atrium that may have been due to a compressed pulmonary vein but could not be fully characterized.   Review of Systems: All systems reviewed and negative except as per HPI.   Home Medications Prior to Admission medications   Medication Sig Start Date End Date Taking? Authorizing Provider  carvedilol (COREG) 6.25 MG tablet TAKE 1 AND 1/2 TABLET BY MOUTH TWICE A DAY Patient taking differently: Take 9.375 mg by mouth 2 (two) times daily with a meal.  04/10/18  Yes Larey Dresser, MD  hydrALAZINE (APRESOLINE) 25 MG tablet Take 1 tablet (25 mg  total) by mouth 3 (three) times daily. 06/22/18  Yes Larey Dresser, MD  PROVENTIL HFA [id number] Base) MCG/ACT inhaler INHALE 1 TO 2 PUFFS BY MOUTH EVERY 6 (SIX) HOURS AS NEEDED FOR WHEEZING OR SHORTNESS OF BREATH Patient taking differently: Inhale 1-2 puffs into the lungs every 6 (six) hours as needed for wheezing.  07/24/18  Yes [name], MD  torsemide (DEMADEX) 20 MG tablet Take 3 tablets (60 mg total) by mouth daily. 10/12/18  Yes Clegg, [name] D, NP  metolazone (ZAROXOLYN) 2.5 MG tablet Take 1 tablet (2.5 mg total) by mouth as needed. Patient not taking: Reported on 04/13/2018 03/16/18   Darrick Grinder D, NP  OXYGEN Inhale 2 L/min into the lungs continuous.    [provider]    Past Medical History: Past Medical History:  Diagnosis Date  . Arthritis    "hands, feet" (10/31/2013)  . CHF (congestive heart failure) ([place])   . Chronic bronchitis ([place])   . High cholesterol   . Hypertension   . Obesity   . OSA on CPAP   . Pneumonia 09/2011  . Substance abuse ([place]) 8/31/213   cociane "first time use"  . Tobacco abuse     Past Surgical History: Past Surgical History:  Procedure Laterality Date  . NO PAST SURGERIES    . TUBES IN EARS      Family History: Family History  Problem Relation Age of Onset  . Heart disease Mother  Pacemaker  . Hypertension Father   . Cancer Paternal Grandfather   . Heart attack Neg Hx   . Stroke Neg Hx     Social History: Social History   Socioeconomic History  . Marital status: Single    Spouse name: Not on file  . Number of children: 2  . Years of education: Not on file  . Highest education level: Not on file  Occupational History  . Occupation: N/A    Employer: NOT EMPLOYED  Social Needs  . Financial resource strain: Not on file  . Food insecurity    Worry: Not on file    Inability: Not on file  . Transportation needs    Medical: Not on file    Non-medical: Not on file  Tobacco Use  . Smoking status: Former Smoker     Packs/day: 1.50    Years: 3.00    Pack years: 4.50    Types: Cigarettes    Quit date: 10/31/2013    Years since quitting: 4.9  . Smokeless tobacco: Never Used  Substance and Sexual Activity  . Alcohol use: No    Alcohol/week: 1.0 standard drinks    Types: 1 Cans of beer per week  . Drug use: No    Types: Cocaine    Comment: 10/31/2013 "might use cocaine once/month" - Has not used since 2015.   Marland Kitchen Sexual activity: Never  Lifestyle  . Physical activity    Days per week: Not on file    Minutes per session: Not on file  . Stress: Not on file  Relationships  . Social Herbalist on phone: Not on file    Gets together: Not on file    Attends religious service: Not on file    Active member of club or organization: Not on file    Attends meetings of clubs or organizations: Not on file    Relationship status: Not on file  Other Topics Concern  . Not on file  Social History Narrative  . Not on file    Allergies:  Allergies  Allergen Reactions  . Lisinopril Cough    Objective:    Vital Signs:   Temp:  [97.9 F (36.6 C)-99.5 F (37.5 C)] 98.8 F (37.1 C) (09/21 1407) Pulse Rate:  [54-74] 58 (09/21 1345) Resp:  [15-23] 15 (09/21 1407) BP: (136-164)/(69-119) 159/116 (09/21 1407) SpO2:  [93 %-100 %] 100 % (09/21 1407) Last BM Date: 10/14/18  Weight change: Filed Weights   10/14/18 1135  Weight: 123.6 kg    Intake/Output:   Intake/Output Summary (Last 24 hours) at 10/15/2018 1448 Last data filed at 10/15/2018 1419 Gross per 24 hour  Intake 1340 ml  Output 200 ml  Net 1140 ml      Physical Exam    General:  Well appearing. No resp difficulty HEENT: normal Neck: supple. JVP 8-9 cm. Carotids 2+ bilat; no bruits. No lymphadenopathy or thyromegaly appreciated. Cor: PMI nondisplaced. Regular rate & rhythm. No rubs, gallops or murmurs. Lungs: clear Abdomen: soft, nontender, nondistended. No hepatosplenomegaly. No bruits or masses. Good bowel sounds.  Extremities: no cyanosis, clubbing, rash. 1+ edema 1/2 to knees bilaterally.  Neuro: slurred speech, right-sided weakness.    Telemetry   NSR 70s (personally reviewed)  EKG    NSR, RVH with repolarization abnormality.   Labs   Basic Metabolic Panel: Recent Labs  Lab 10/09/18 2203 10/11/18 1037 10/14/18 0649 10/14/18 0651 10/15/18 0353  NA 137 139 140 140 141  K  5.9* 5.5* 4.6 4.4 3.9  CL 83* 85* 89* 89* 92*  CO2 42* 44* 37*  --  39*  GLUCOSE 65* 91 121* 115* 109*  BUN 50* 41* 27* 39* 17  CREATININE 2.01* 1.61* 1.83* 1.60* 1.45*  CALCIUM 9.3 9.0 8.4*  --  9.1    Liver Function Tests: Recent Labs  Lab 10/14/18 0649  AST 31  ALT 16  ALKPHOS 47  BILITOT 1.4*  PROT 6.1*  ALBUMIN 3.1*   No results for input(s): LIPASE, AMYLASE in the last 168 hours. No results for input(s): AMMONIA in the last 168 hours.  CBC: Recent Labs  Lab 10/09/18 1418 10/14/18 0649 10/14/18 0651 10/15/18 0353  WBC 8.0 7.8  --  7.1  NEUTROABS  --  5.3  --   --   HGB 18.4* 18.2* 21.8* 18.0*  HCT 64.4* 61.1* 64.0* 61.6*  MCV 101.1* 98.5  --  98.7  PLT 148* 161  --  PLATELET CLUMPS NOTED ON SMEAR, COUNT APPEARS DECREASED    Cardiac Enzymes: No results for input(s): CKTOTAL, CKMB, CKMBINDEX, TROPONINI in the last 168 hours.  BNP: BNP (last 3 results) Recent Labs    06/22/18 1429 10/09/18 2034  BNP 307.1* 392.2*    ProBNP (last 3 results) No results for input(s): PROBNP in the last 8760 hours.   CBG: Recent Labs  Lab 10/09/18 2343 10/14/18 0647  GLUCAP 138* 116*    Coagulation Studies: Recent Labs    10/14/18 0649  LABPROT 13.8  INR 1.1     Imaging    No results found.   Medications:     Current Medications: . [START ON 10/16/2018] aspirin EC  81 mg Oral Daily  . atorvastatin  80 mg Oral q1800  . clopidogrel  75 mg Oral Daily  . enoxaparin (LOVENOX) injection  40 mg Subcutaneous Q24H  . torsemide  60 mg Oral Daily     Infusions:      Assessment/Plan   1. Chronic diastolic CHF with prominent RV dysfunction/cor pulmonale: TEE today showed a severely dilated and moderately dysfunctional RV with D-shaped septum.  Suspect long-standing cor pulmonale due to incompletely treated OHS/OSA.  He has secondary polycythemia.  He has not been using CPAP at home.  On exam, suspect at least mild volume overload.  - Would restart torsemide 80 mg daily.  2. CVA: I suspect that he had a cardio-embolic CVA.  Slow flow, swirling with spontaneous contrast noted in the severely dilated RA. There was a moderate-sized PFO with bidirectional shunting.  I strongly suspect this was the source of his CVA. Atrial fibrillation has not been seen, no LA appendage thrombus.  - Needs lower extremity dopplers to look for DVT.  - He needs systemic anticoagulation, ideally apixaban, when neurology deems this safe to start.  - I discussed with Dr Burt Knack who will look at the TEE images to see if he would be a PFO closure candidate (hopefully).  3. OHS/OSA: Inadequately treated.  See above, needs to use CPAP every night.  Using 2L home oxygen.  4. HTN: Restart home antihypertensives when ok with neurology.  5. Abnormal flow into left atrium: Noted on TEE, turbulent flow into LA adjacent to the right upper pulmonary vein (continuous).  This was separate from the PFO and does not represent an ASD.  ?Compressed right middle or right lower pulmonary vein.   - Will investigate with coronary CTA.   Length of Stay: 1  [name], MD  10/15/2018, 2:48 PM  Advanced Heart Failure Team Pager [phone] (M-F; [place])  Please contact [facility] for night-coverage after hours (4p -7a ) and weekends on [email or web address]

## 2018-10-15 NOTE — Anesthesia Postprocedure Evaluation (Signed)
Anesthesia Post Note  Patient: Douglas [name].  Procedure(s) Performed: TRANSESOPHAGEAL ECHOCARDIOGRAM (TEE) (N/A ) BUBBLE STUDY     Patient location during evaluation: Endoscopy Anesthesia Type: MAC Level of consciousness: awake and alert Pain management: pain level controlled Vital Signs Assessment: post-procedure vital signs reviewed and stable Respiratory status: spontaneous breathing, nonlabored ventilation, respiratory function stable and patient connected to nasal cannula oxygen Cardiovascular status: blood pressure returned to baseline and stable Postop Assessment: no apparent nausea or vomiting Anesthetic complications: no    Last Vitals:  Vitals:   10/15/18 1502 10/15/18 1726  BP: (!) 151/106 137/90  Pulse:  71  Resp: 17   Temp:  37.1 C  SpO2: 93% 97%    Last Pain:  Vitals:   10/15/18 1726  TempSrc: Oral  PainSc:                  Barnet Glasgow

## 2018-10-15 NOTE — H&P (View-Only) (Signed)
    [facility] has been requested to perform a transesophageal echocardiogram on Douglas D Kadel Jr. for Stroke.  After careful review of history and examination, the risks and benefits of transesophageal echocardiogram have been explained including risks of esophageal damage, perforation (1:10,000 risk), bleeding, pharyngeal hematoma as well as other potential complications associated with conscious sedation including aspiration, arrhythmia, respiratory failure and death. Alternatives to treatment were discussed, questions were answered. Patient is willing to proceed. He was anxious so I talked with his sister by phone at his request.  Douglas [name]  They both agreed to proceed.     Pt does have sleep apnea.    Cecilie Kicks, NP  10/15/2018 10:49 AM

## 2018-10-15 NOTE — Discharge Instructions (Signed)
Information on my medicine - ELIQUIS (apixaban)  This medication education was reviewed with me or my healthcare representative as part of my discharge preparation.  The pharmacist that spoke with me during my hospital stay was:  Florentina Marquart Z  Jamirah Zelaya,, RPH  Why was Eliquis prescribed for you? Eliquis was prescribed for you to reduce the risk of a blood clot forming that can cause a stroke.  What do You need to know about Eliquis ? Take your Eliquis TWICE DAILY - one tablet in the morning and one tablet in the evening with or without food. If you have difficulty swallowing the tablet whole please discuss with your pharmacist how to take the medication safely.  Take Eliquis exactly as prescribed by your doctor and DO NOT stop taking Eliquis without talking to the doctor who prescribed the medication.  Stopping may increase your risk of developing a stroke.  Refill your prescription before you run out.  After discharge, you should have regular check-up appointments with your healthcare provider that is prescribing your Eliquis.  In the future your dose may need to be changed if your kidney function or weight changes by a significant amount or as you get older.  What do you do if you miss a dose? If you miss a dose, take it as soon as you remember on the same day and resume taking twice daily.  Do not take more than one dose of ELIQUIS at the same time to make up a missed dose.  Important Safety Information A possible side effect of Eliquis is bleeding. You should call your healthcare provider right away if you experience any of the following: ? Bleeding from an injury or your nose that does not stop. ? Unusual colored urine (red or dark brown) or unusual colored stools (red or black). ? Unusual bruising for unknown reasons. ? A serious fall or if you hit your head (even if there is no bleeding).  Some medicines may interact with Eliquis and might increase your risk of bleeding or  clotting while on Eliquis. To help avoid this, consult your healthcare provider or pharmacist prior to using any new prescription or non-prescription medications, including herbals, vitamins, non-steroidal anti-inflammatory drugs (NSAIDs) and supplements.  This website has more information on Eliquis (apixaban): http://www.eliquis.com/eliquis/home

## 2018-10-16 ENCOUNTER — Inpatient Hospital Stay (HOSPITAL_COMMUNITY): Payer: Medicaid Other

## 2018-10-16 DIAGNOSIS — I639 Cerebral infarction, unspecified: Secondary | ICD-10-CM

## 2018-10-16 DIAGNOSIS — I6389 Other cerebral infarction: Secondary | ICD-10-CM

## 2018-10-16 DIAGNOSIS — Q211 Atrial septal defect: Secondary | ICD-10-CM

## 2018-10-16 LAB — BASIC METABOLIC PANEL
Anion gap: 12 (ref 5–15)
BUN: 14 mg/dL (ref 6–20)
CO2: 33 mmol/L — ABNORMAL HIGH (ref 22–32)
Calcium: 8.7 mg/dL — ABNORMAL LOW (ref 8.9–10.3)
Chloride: 93 mmol/L — ABNORMAL LOW (ref 98–111)
Creatinine, Ser: 1.55 mg/dL — ABNORMAL HIGH (ref 0.61–1.24)
GFR calc Af Amer: >60 mL/min (ref >60)
GFR calc non Af Amer: 57 mL/min — ABNORMAL LOW (ref >60)
Glucose, Bld: 103 mg/dL — ABNORMAL HIGH (ref 70–99)
Potassium: 4.5 mmol/L (ref 3.5–5.1)
Sodium: 138 mmol/L (ref 135–145)

## 2018-10-16 LAB — CBC
HCT: 62.4 % — ABNORMAL HIGH (ref 39.0–52.0)
Hemoglobin: 17.6 g/dL — ABNORMAL HIGH (ref 13.0–17.0)
MCH: 27.8 pg (ref 26.0–34.0)
MCHC: 28.2 g/dL — ABNORMAL LOW (ref 30.0–36.0)
MCV: 98.7 fL (ref 80.0–100.0)
Platelets: 95 10*3/uL — ABNORMAL LOW (ref 150–400)
RBC: 6.32 MIL/uL — ABNORMAL HIGH (ref 4.22–5.81)
RDW: 15.5 % (ref 11.5–15.5)
WBC: 6.8 10*3/uL (ref 4.0–10.5)
nRBC: 0 % (ref 0.0–0.2)

## 2018-10-16 LAB — HOMOCYSTEINE: Homocysteine: 9.9 umol/L (ref 0.0–14.5)

## 2018-10-16 MED ORDER — NITROGLYCERIN 0.4 MG SL SUBL
SUBLINGUAL_TABLET | SUBLINGUAL | Status: AC
  Administered 2018-10-16: 16:00:00 0.8 mg
  Filled 2018-10-16: qty 2

## 2018-10-16 MED ORDER — METOPROLOL TARTRATE 5 MG/5ML IV SOLN
INTRAVENOUS | Status: AC
  Administered 2018-10-16: 5 mg
  Filled 2018-10-16: qty 5

## 2018-10-16 MED ORDER — METOPROLOL TARTRATE 5 MG/5ML IV SOLN
INTRAVENOUS | Status: AC
  Administered 2018-10-16: 16:00:00 5 mg
  Filled 2018-10-16: qty 5

## 2018-10-16 MED ORDER — NITROGLYCERIN 0.4 MG SL SUBL
SUBLINGUAL_TABLET | SUBLINGUAL | Status: AC
  Filled 2018-10-16: qty 2

## 2018-10-16 MED ORDER — IOHEXOL 350 MG/ML SOLN
100.0000 mL | Freq: Once | INTRAVENOUS | Status: AC | PRN
  Administered 2018-10-16: 100 mL via INTRAVENOUS

## 2018-10-16 NOTE — Progress Notes (Signed)
SLP Cancellation Note  Patient Details Name: Douglas [name]. MRN: [id number] DOB: [birth year]   Cancelled treatment:       Reason Eval/Treat Not Completed: Patient at procedure or test/unavailable(Pt with cardiologist at this time. SLP will follow up. )  Kalilah Barua I. Hardin Negus, [place], [place] Office number [phone] Pager [place] 10/16/2018, 4:48 PM

## 2018-10-16 NOTE — Progress Notes (Signed)
Subjective: Patient seen at the bedside on rounds this morning.  Patient sitting up at the bedside chair working with OT.  He says he feels well.  He says that he spoke with the neurology and cardiology teams and understands he needs the PFO closed.  From a rehab standpoint, the admissions coordinator was in the room and saw the patient he does not need inpatient rehab.  Patient endorses understanding and will pursue outpatient therapy. No complaints at this time.  Objective:  TEE: IMPRESSIONS 1. Left ventricular ejection fraction, by visual estimation, is 55%. The left ventricle has normal function. Normal left ventricular size. There is moderately increased left ventricular hypertrophy. 2. Global right ventricle has moderately reduced systolic function.The right ventricular size is severely enlarged. Mildly increased right ventricular wall thickness. D-shaped septum suggestive of RV pressure/volume overload. 3. Trivial pericardial effusion is present. 4. The tricuspid valve is normal in structure. Tricuspid valve regurgitation is mild. Peak RV-RA gradient 38 mmHg. 5. Mild left atrial enlargement. There turbulent flow into the left atrium adjacent to the right upper pulmonary vein. The flow is continous by CW doppler but does not appear to be an ASD with right to left shunting (does not originate from the RA). It possibly represents ?a compressed pulmonary vein. Needs further evaluation. 6. Evidence of atrial level shunting detected by color flow Doppler. Patient has a moderate PFO with bidirectional flow. Positive bubble study. 7. Severe right atrial enlargement. There is spontaneous contrast noted in the RA suggesive of low flow state and risk of thrombosis. 8. The aortic root was not well visualized. 9. The aortic valve is tricuspid Aortic valve regurgitation was not visualized by color flow Doppler. Structurally normal aortic valve, with no evidence of sclerosis or stenosis. 10. The mitral  valve is normal in structure. Trace mitral valve regurgitation. No evidence of mitral stenosis. 11. The pulmonic valve was normal in structure. Pulmonic valve regurgitation is not visualized by color flow Doppler.  Vital signs in last 24 hours: Vitals:   10/16/18 0021 10/16/18 0407 10/16/18 0913 10/16/18 1018  BP: (!) 133/101 136/90 (!) 133/99   Pulse: 68 61 72   Resp:   17   Temp: 98.4 F (36.9 C) 98.9 F (37.2 C) 98.5 F (36.9 C)   TempSrc: Oral Oral Oral   SpO2: 96% 99% 92% 90%  Weight:      Height:       Physical Exam: General: Sitting up in chair, NAD HEENT: NCAT, [place] in place CV: RRR, normal S1 and S2 no murmurs rubs or gallops PULM: Clear to auscultation bilaterally, no crackles appreciated ABD: Nontender in all quadrants and nondistended  NEURO: Mental Status: Patient is awake, alert, oriented x3 Mild aphagia, No facial droop. Cranial Nerves: II: Pupils equal, round, and reactive to light.   III,IV, VI: EOMI without ptosis or diploplia.  V: Facial sensation is symmetric to light touch VII: Facial movement is symmetric.  VIII: hearing is intact to voice X: Uvula elevates symmetrically XI: Shoulder shrug is symmetric. XII: tongue is midline without atrophy or fasciculations.  Motor: 5/5 effort throughout upper and lower extremities Sensory: Sensation is grossly intact in bilateral UEs & LEs Cerebellar: Finger-Nose intact  Assessment/Plan:  Active Problems:   Cor pulmonale, chronic (HCC)   CVA (cerebral vascular accident) ([place])  In summary, [name] is a 33 year old male with a history of CHF (EF 55-60% last week) on 2 L O2 at baseline, HTN, polysubstance abuse, obesity, and OSA who presented with  left-sided weakness slurred speech and facial droop, and head imaging notable for M2/M3 vessel occlusion consistent with an acute stroke. The patient has mild slurred speech but his left-sided weakness and facial droop has improved markedly. TEE was notable for  PFO, high suspicion for paradoxical emboli causing stroke. The patient will need PFO closure and anticoagulation moving forward.   #PFO suspected paradoxical emboli #CVA: L sided M2/M3 occlusion appreciated on CT angio of the head. MRI significant for small acute left MCA infarct. TEE significant for severely dilated right atrium, PFO with bidirectional flow highly suspicious for paradoxical emboli.  Patient will need a PFO closure. We will appreciate cardiology's recommendations on how to proceed. Neurology signed off at this time. - Lupus anticoagulant negative - Hypercoagulability studies pending (homocysteine,beta-2-glycoprotein antibodies, cardiolipin antibodies)  - Discontinued aspirin and plavix, started Eliquis 5 mg twice daily per cardiology and neurology recommendations due to suspected paradoxical emboli. - CT coronaries ordered to evaluate turbulent flow seen on TEE, per cardiology recs - PT/OT/SLP consults recommend therapy. CIR in agreement. - A1c m 5.6, metformin not indicated at this time - Continue atorvastatin 80 mg daily   #HTN #CHF: (EF of 55-60% at cardiology visit last week) patient is on 2 L of oxygen at baseline.  Patient takes carvedilol 6.25 mg daily, hydralazine 25 mg daily, metolazone 2.5 mg daily at home.  Also started on torsemide 60 mg daily at last heart failure clinic visit a few days ago. - Carvedilol 6.25 twice daily and torsemide 80 mg per CHF team recs. Will slowly titrate anti hypertensives.  - IV hydralazine 5 mg every 4 hours as needed for BP [id number] systolic or [id number] diastolic per Neuro recs. - Supplemental O2 PRN. Currently on home 2L  #FEN/GI - IV fluids: None - Diet: Low sodium  #DVT prophylaxis  -  Eliquis 5 mg twice daily  #Code status: FULL   #Dispo: Admit patient to Inpatient with expected length of stay greater than 2 midnights.  Earlene Plater, MD Internal Medicine, PGY1 Pager: [phone]  10/16/2018,11:16 AM

## 2018-10-16 NOTE — Evaluation (Signed)
Occupational Therapy Evaluation Patient Details Name: Douglas [name]. MRN: [id number] DOB: 1985/09/20 Today's Date: 10/16/2018    History of Present Illness Douglas [name] is a 33 year old male with a past medical history significant for CHF (EF 55-60% last week) on 2 L O2 at baseline, HTN, polysubstance abuse, obesity, and OSA who presented to the ED today after experiencing right arm weakness, right leg weakness, and slurred speech when he woke up this morning. MRI showed L MCA infarct as well as chronic L posterior circulation infarcts   Clinical Impression   Pt admitted with above diagnoses, with decreased [place] in R hand and decreased activity tolerance limiting ability to engage in BADL at desired level of ind. PTA, pt reports ind with BADLS with occasional assist from family for IADLs. He is on disability due to CHF. He wears 2L O2 at home. At time of eval he is min guard for transfers and functional mobility in the room without device, improving to supervision with RW. Pt stood at sink to complete grooming tasks with min guard A. Shows increased difficulty with global R hand [facility] skills. At this time, recommending pt receive OP OT at d/c. Will continue to follow per POC listed below.    Follow Up Recommendations  Outpatient OT    Equipment Recommendations  3 in 1 bedside commode    Recommendations for Other Services       Precautions / Restrictions Precautions Precautions: Fall Restrictions Weight Bearing Restrictions: No      Mobility Bed Mobility               General bed mobility comments: patient received in recliner  Transfers Overall transfer level: Needs assistance Equipment used: Rolling walker (2 wheeled);None Transfers: Sit to/from Stand Sit to Stand: Min guard         General transfer comment: supervision with RW min guard without    Balance Overall balance assessment: Needs assistance Sitting-balance support: Feet supported Sitting  balance-Leahy Scale: Good     Standing balance support: Bilateral upper extremity supported Standing balance-Leahy Scale: Fair Standing balance comment: needing to lean on sink while brushing teeth for dynamic balance                           ADL either performed or assessed with clinical judgement   ADL Overall ADL's : Needs assistance/impaired Eating/Feeding: Set up;Sitting   Grooming: Min guard;Standing;Oral care;Wash/dry face Grooming Details (indicate cue type and reason): increased time to open packaging Upper Body Bathing: Set up;Sitting   Lower Body Bathing: Minimal assistance;Sit to/from stand;Sitting/lateral leans   Upper Body Dressing : Set up;Sitting   Lower Body Dressing: Minimal assistance;Sit to/from stand;Sitting/lateral leans   Toilet Transfer: Loss adjuster, chartered Details (indicate cue type and reason): stood to urinate at regular toilet Toileting- Clothing Manipulation and Hygiene: Set up;Sit to/from stand;Sitting/lateral lean   Tub/ Shower Transfer: Min guard;Shower seat;Ambulation   Functional mobility during ADLs: Min guard;Rolling walker General ADL Comments: pt overall min guard for BADL activity, limited 2/2 RUE decrease in coordination and decreased activity tol     Vision Patient Visual Report: No change from baseline       Perception     Praxis      Pertinent Vitals/Pain Pain Assessment: No/denies pain     Hand Dominance     Extremity/Trunk Assessment Upper Extremity Assessment Upper Extremity Assessment: Generalized weakness;RUE deficits/detail RUE Deficits / Details: increase ability to complete opposition  and in hand manipulation skills RUE Coordination: decreased fine motor   Lower Extremity Assessment Lower Extremity Assessment: Defer to PT evaluation       Communication Communication Communication: No difficulties   Cognition Arousal/Alertness: Awake/alert Behavior During Therapy: WFL for  tasks assessed/performed Overall Cognitive Status: Within Functional Limits for tasks assessed                                 General Comments: no cognitive deficits noted at time of eval   General Comments       Exercises     Shoulder Instructions      Home Living Family/patient expects to be discharged to:: Private residence Living Arrangements: Alone Available Help at Discharge: Family;Available 24 hours/day Type of Home: Apartment Home Access: Level entry     Home Layout: One level     Bathroom Shower/Tub: Tub/shower unit         Home Equipment: Other (comment)(home O2)   Additional Comments: above home info reflects apt pt planning to move to. Currently in place with more stairs      Prior Functioning/Environment Level of Independence: Independent        Comments: pt on disability from CHF        OT Problem List: Decreased strength;Decreased knowledge of use of DME or AE;Decreased coordination;Decreased activity tolerance;Cardiopulmonary status limiting activity;Impaired balance (sitting and/or standing)      OT Treatment/Interventions: Self-care/ADL training;Therapeutic exercise;Patient/family education;Balance training;Energy conservation;Therapeutic activities;DME and/or AE instruction    OT Goals(Current goals can be found in the care plan section) Acute Rehab OT Goals Patient Stated Goal: get better OT Goal Formulation: With patient Time For Goal Achievement: 10/30/18 Potential to Achieve Goals: Good  OT Frequency: Min 2X/week   Barriers to D/C:            Co-evaluation              AM-PAC OT "6 Clicks" Daily Activity     Outcome Measure Help from another person eating meals?: None Help from another person taking care of personal grooming?: A Little Help from another person toileting, which includes using toliet, bedpan, or urinal?: A Little Help from another person bathing (including washing, rinsing, drying)?: A  Little Help from another person to put on and taking off regular upper body clothing?: None Help from another person to put on and taking off regular lower body clothing?: A Little 6 Click Score: 20   End of Session Equipment Utilized During Treatment: Gait belt;Rolling walker;Oxygen Nurse Communication: Mobility status  Activity Tolerance: Patient tolerated treatment well Patient left: in bed;with call bell/phone within reach;Other (comment)(goint to CT with transport)  OT Visit Diagnosis: Other abnormalities of gait and mobility (R26.89);Other symptoms and signs involving the nervous system (R29.898)                Time: [id number] OT Time Calculation (min): 24 min Charges:  OT General Charges $OT Visit: 1 Visit OT Evaluation $OT Eval Low Complexity: 1 Low OT Treatments $Self Care/Home Management : 8-22 mins  Zenovia Jarred, MSOT, OTR/L Behavioral Health OT/ Acute Relief OT [facility] Office: [phone]  Zenovia Jarred 10/16/2018, 2:36 PM

## 2018-10-16 NOTE — Progress Notes (Addendum)
Inpatient Rehabilitation Admissions Coordinator  Inpatient rehab consult received. I met with patient at bedside for assessment. He states he just walked down hallway with therapy and was doing very well. He states he does not need OT for bathing and dressing needs. I await therapy treatment notes from that session. His Mom and his Dad can provide 24/7 assistance at home at d/c. I will follow up today with my recommendations pending therapy updates.  Danne Baxter, RN, MSN Rehab Admissions Coordinator [phone] 10/16/2018 10:25 AM   Therapy now recommending outpatient follow up . Not in need of an inpt rehab admission at this time. We will sign off.  Danne Baxter, RN, MSN Rehab Admissions Coordinator [phone] 10/16/2018 10:41 AM

## 2018-10-16 NOTE — Progress Notes (Signed)
CSW following patient through outpatient [facility] program.  Visited inpatient to check in and assist with follow up as needed.  Inpatient TOC team working to get patient in outpatient rehab and get patient a new CPAP as old CPAP was taken due to noncompliance.  Pt reports he is doing well and is glad that he has continued to recover and is going to be able to go home at discharge.    CSW will continue to follow in outpatient setting and assist as needed  Jorge Ny, [facility] Desk#: [phone] Cell#: [phone]

## 2018-10-16 NOTE — Progress Notes (Signed)
Physical Therapy Treatment Patient Details Name: Douglas [name]. MRN: [id number] DOB: 12/30/1985 Today's Date: 10/16/2018    History of Present Illness [name] is a 33 year old male with a past medical history significant for CHF (EF 55-60% last week) on 2 L O2 at baseline, HTN, polysubstance abuse, obesity, and OSA who presented to the ED today after experiencing right arm weakness, right leg weakness, and slurred speech when he woke up this morning. MRI showed L MCA infarct as well as chronic L posterior circulation infarcts    PT Comments    Patient received in recliner. Agrees to PT session. Pleasant, reports weakness on right side improving. Patient performed sit >< stand transfers with min guard. Ambulated with RW 200 feet with min guard. No LOB or unsteadiness noted this session. O2sats remained > 90% on 2 lpm throughout ambulation. Patient has slight right LE weakness and min buckling. Patient will continue to benefit from skilled PT while here to improve independence with mobility and improve safety.     Follow Up Recommendations  Outpatient PT     Equipment Recommendations  Rolling walker with 5" wheels    Recommendations for Other Services       Precautions / Restrictions Precautions Precautions: Fall Restrictions Weight Bearing Restrictions: No    Mobility  Bed Mobility               General bed mobility comments: patient received in recliner  Transfers Overall transfer level: Needs assistance Equipment used: Rolling walker (2 wheeled) Transfers: Sit to/from Stand Sit to Stand: Supervision            Ambulation/Gait Ambulation/Gait assistance: Min guard Gait Distance (Feet): 200 Feet Assistive device: Rolling walker (2 wheeled) Gait Pattern/deviations: Decreased weight shift to right;Decreased dorsiflexion - right;Wide base of support Gait velocity: decreased   General Gait Details: mild weakness noted in R LE,  buckling.   Stairs             Wheelchair Mobility    Modified Rankin (Stroke Patients Only) Modified Rankin (Stroke Patients Only) Pre-Morbid Rankin Score: No symptoms Modified Rankin: Slight disability     Balance Overall balance assessment: Modified Independent Sitting-balance support: Feet supported Sitting balance-Leahy Scale: Good     Standing balance support: Bilateral upper extremity supported Standing balance-Leahy Scale: Good Standing balance comment: No LOB with mobility                            Cognition Arousal/Alertness: Awake/alert Behavior During Therapy: WFL for tasks assessed/performed Overall Cognitive Status: Within Functional Limits for tasks assessed                               Problem Solving: Requires verbal cues        Exercises      General Comments        Pertinent Vitals/Pain Pain Assessment: No/denies pain    Home Living                      Prior Function            PT Goals (current goals can now be found in the care plan section) Acute Rehab PT Goals Patient Stated Goal: get better PT Goal Formulation: With patient Time For Goal Achievement: 10/28/18 Potential to Achieve Goals: Good Progress towards PT goals: Progressing toward goals    Frequency  Min 4X/week      PT Plan Discharge plan needs to be updated    Co-evaluation              AM-PAC PT "6 Clicks" Mobility   Outcome Measure  Help needed turning from your back to your side while in a flat bed without using bedrails?: None Help needed moving from lying on your back to sitting on the side of a flat bed without using bedrails?: None Help needed moving to and from a bed to a chair (including a wheelchair)?: A Little Help needed standing up from a chair using your arms (e.g., wheelchair or bedside chair)?: A Little Help needed to walk in hospital room?: A Little Help needed climbing 3-5 steps with a  railing? : A Little 6 Click Score: 20    End of Session Equipment Utilized During Treatment: Gait belt;Oxygen Activity Tolerance: Patient tolerated treatment well Patient left: in chair;with call bell/phone within reach Nurse Communication: Mobility status PT Visit Diagnosis: Muscle weakness (generalized) (M62.81);Other abnormalities of gait and mobility (R26.89)     Time: [id number] PT Time Calculation (min) (ACUTE ONLY): 23 min  Charges:  $Gait Training: 23-37 mins                     Yida Hyams, PT, GCS 10/16/18,10:26 AM

## 2018-10-16 NOTE — Progress Notes (Addendum)
Advanced Heart Failure Rounding Note  PCP-Cardiologist: No primary care provider on file.   Subjective:   Yesterday torsemide was restarted.   Feeling better. Says weakness has resolved.   Objective:   Weight Range: 123.6 kg Body mass index is 48.27 kg/m.   Vital Signs:   Temp:  [97.9 F (36.6 C)-98.9 F (37.2 C)] 98.9 F (37.2 C) (09/22 0407) Pulse Rate:  [57-74] 61 (09/22 0407) Resp:  [15-20] 17 (09/21 1502) BP: (133-164)/(90-116) 136/90 (09/22 0407) SpO2:  [93 %-100 %] 99 % (09/22 0407) Last BM Date: 10/16/18  Weight change: Filed Weights   10/14/18 1135  Weight: 123.6 kg    Intake/Output:   Intake/Output Summary (Last 24 hours) at 10/16/2018 0906 Last data filed at 10/16/2018 0131 Gross per 24 hour  Intake 1300 ml  Output 240 ml  Net 1060 ml      Physical Exam    General:  Sitting in the chair. . No resp difficulty HEENT: Normal Neck: Supple. JVP . Carotids 2+ bilat; no bruits. No lymphadenopathy or thyromegaly appreciated. Cor: PMI nondisplaced. Regular rate & rhythm. No rubs, gallops or murmurs. Lungs: Clear Abdomen: Soft, nontender, nondistended. No hepatosplenomegaly. No bruits or masses. Good bowel sounds. Extremities: No cyanosis, clubbing, rash, edema Neuro: Alert & orientedx3, cranial nerves grossly intact. moves all 4 extremities w/o difficulty. Affect pleasant   Telemetry   SR 60s personally reviewed.   EKG    N/a   Labs    CBC Recent Labs    10/14/18 0649  10/15/18 0353 10/16/18 0416  WBC 7.8  --  7.1 6.8  NEUTROABS 5.3  --   --   --   HGB 18.2*   < > 18.0* 17.6*  HCT 61.1*   < > 61.6* 62.4*  MCV 98.5  --  98.7 98.7  PLT 161  --  PLATELET CLUMPS NOTED ON SMEAR, COUNT APPEARS DECREASED 95*   < > = values in this interval not displayed.   Basic Metabolic Panel Recent Labs    10/15/18 0353 10/16/18 0416  NA 141 138  K 3.9 4.5  CL 92* 93*  CO2 39* 33*  GLUCOSE 109* 103*  BUN 17 14  CREATININE 1.45* 1.55*   CALCIUM 9.1 8.7*   Liver Function Tests Recent Labs    10/14/18 0649  AST 31  ALT 16  ALKPHOS 47  BILITOT 1.4*  PROT 6.1*  ALBUMIN 3.1*   No results for input(s): LIPASE, AMYLASE in the last 72 hours. Cardiac Enzymes No results for input(s): CKTOTAL, CKMB, CKMBINDEX, TROPONINI in the last 72 hours.  BNP: BNP (last 3 results) Recent Labs    06/22/18 1429 10/09/18 2034  BNP 307.1* 392.2*    ProBNP (last 3 results) No results for input(s): PROBNP in the last 8760 hours.   D-Dimer No results for input(s): DDIMER in the last 72 hours. Hemoglobin A1C Recent Labs    10/14/18 0649  HGBA1C 5.6   Fasting Lipid Panel Recent Labs    10/14/18 0649  CHOL 124  HDL 35*  LDLCALC 77  TRIG 59  CHOLHDL 3.5   Thyroid Function Tests Recent Labs    10/15/18 0353  TSH 0.419    Other results:   Imaging     No results found.   Medications:     Scheduled Medications: . apixaban  5 mg Oral BID  . atorvastatin  80 mg Oral q1800  . carvedilol  6.25 mg Oral BID WC  . torsemide  80 mg Oral Daily     Infusions:   PRN Medications:  acetaminophen **OR** acetaminophen (TYLENOL) oral liquid 160 mg/5 mL **OR** acetaminophen, hydrALAZINE, senna-docusate     Assessment/Plan   1. Chronic diastolic CHF with prominent RV dysfunction/cor pulmonale: TEE today showed a severely dilated and moderately dysfunctional RV with D-shaped septum.  Suspect long-standing cor pulmonale due to incompletely treated OHS/OSA.  He has secondary polycythemia.  He has not been using CPAP at home.  Volume status stable.  - Continue torsemide 80 mg daily.  2. CVA: I suspect that he had a cardio-embolic CVA.  Slow flow, swirling with spontaneous contrast noted in the severely dilated RA. There was a moderate-sized PFO with bidirectional shunting.  Strongly suspect this was the source of his CVA. Atrial fibrillation has not been seen, no LA appendage thrombus.  - Needs lower extremity  dopplers to look for DVT.  - He needs systemic anticoagulation -Continue  apixaban - Dr Aundra Dubin discussed with Dr Burt Knack who will look at the TEE images to see if he would be a PFO closure candidate (hopefully).  3. OHS/OSA: Inadequately treated.  See above, needs to use CPAP every night.  Using 2L home oxygen.  4. HTN: Restart home antihypertensives when ok with neurology.  5. Abnormal flow into left atrium: Noted on TEE, turbulent flow into LA adjacent to the right upper pulmonary vein (continuous).  This was separate from the PFO and does not represent an ASD.  ?Compressed right middle or right lower pulmonary vein.   - Will investigate with coronary CTA. Coronary CT today. Heart rate in the 60s ok per Dr Aundra Dubin.  Length of Stay: 2  Darrick Grinder, NP  10/16/2018, 9:06 AM  Advanced Heart Failure Team Pager [phone] (M-F; 7a - 4p)  Please contact [facility] for night-coverage after hours (4p -7a ) and weekends on [email or web address]  Patient seen with NP, agree with the above note.   He is stable today.  Dysarthria resolving and strength returning to near normal on right side.   Lower extremity dopplers negative for DVT.   He is now on Eliquis.   On exam, JVP 7-8 cm with 1+ ankle edema, regular S1S2.   Suspect CVA due to paradoxical embolus across moderate-sized PFO (with signs of low flow in the right atrium).   - Continue Eliquis.  - He will be referred to Dr. Burt Knack for eventual PFO closure.   RV failure due to inadequately treated OHS/OSA with secondary polycythemia.   - I emphasized to him the need to uses CPAP at night (has not been using).  - Continue oxygen during the day.  - Continue current dose of torsemide.   BP elevated but post-CVA, he is back on Coreg.  Think he can probably restart on his hydralazine tomorrow.   Plan for coronary CTA today to assess abnormal finding on TEE (turbulent flow into LA).   [name] 10/16/2018 1:57 PM

## 2018-10-16 NOTE — Progress Notes (Addendum)
  Speech Language Pathology Treatment: Cognitive-Linquistic(Aphasia )  Patient Details Name: Douglas [name]. MRN: [id number] DOB: 1985/10/01 Today's Date: 10/16/2018 Time: [id number] SLP Time Calculation (min) (ACUTE ONLY): 25 min  Assessment / Plan / Recommendation Clinical Impression  Pt was seen for aphasia and dysarthria treatment. Pt's articulatory precision is notably improved and he reported that it is currently back to baseline. Speech was adequately intelligibility during the session. He provided 4-14 items per concrete category during divergent naming tasks with an average of 9 items when min cues were given. He achieved 90% accuracy with responsive naming increasing to 100% with min cues. He demonstrated 50% accuracy with auditory comprehension of voicemail recordings increasing to 100% accuracy with min-mod cues. SLP will continue to follow pt.    HPI HPI: [name] is a 33 year old male with a history of CHF (EF 55-60% last week) on 2 L O2 at baseline, HTN, polysubstance abuse, obesity, and OSA who presented with left-sided weakness slurred speech and facial droop, and head imaging notable for M2/M3 vessel occlusion consistent with an acute stroke. MRI significant for small acute left MCA infarct.        SLP Plan  Continue with current plan of care       Recommendations                   Follow up Recommendations: Inpatient Rehab SLP Visit Diagnosis: Aphasia (R47.01) Plan: Continue with current plan of care       Shaye Elling I. Hardin Negus, [place], [place] Office number [phone] Pager [place] 10/16/2018, 5:31 PM

## 2018-10-16 NOTE — Consult Note (Signed)
[facility]   STRUCTURAL HEART TEAM  Date:  10/16/2018   ID:  [name], DOB Dec 02, 1985, MRN [id number]  PCP:  Medicine, [facility]   Chief Complaint  Patient presents with  . Code Stroke     HISTORY OF PRESENT ILLNESS: Douglas Hocker. is a 33 y.o. male who presents for evaluation of PFO closure, referred by Dr Aundra Dubin for evaluation of PFO management.  The patient is currently hospitalized after suffering an acute left MCA infarct.  The patient is interviewed in the hospital this evening.  He is by himself with no family present.  His sister was in visiting him earlier today.  The patient has significant medical comorbidities.  He is followed for obstructive sleep apnea, right-sided heart failure, hypertension, hyperlipidemia, and has been on home O2.  He presented with slurred speech, right facial droop, and right-sided hemiparesis.  CT and MRI studies demonstrated evidence of an acute left MCA infarct, a chronic left PICA and right parietal-occipital infarcts.  He was not a candidate for TPA or acute stroke intervention because of symptom duration and size of infarcted area.  His stroke work-up demonstrated normal bilateral venous duplex scan, no significant carotid stenosis, and evidence of RV dilatation/dysfunction with a moderate PFO and spontaneous contrast in the right atrium.  He has been followed by Dr. Aundra Dubin with the heart failure service and also by Dr. Erlinda Hong with the stroke service.  Consideration of PFO closure has been recommended.  He has also been started on apixaban.  The patient reports noncompliance with his CPAP.  He is motivated to start back with this.  He has chronic shortness of breath.  Denies chest pain, lightheadedness, or syncope.  He has been on home O2.  He was hospitalized earlier this month with shortness of breath after his supplemental O2 ran out.  The patient denies any history of nickel allergy or reaction.  Past  Medical History:  Diagnosis Date  . Arthritis    "hands, feet" (10/31/2013)  . CHF (congestive heart failure) ([place])   . Chronic bronchitis ([place])   . High cholesterol   . Hypertension   . Obesity   . OSA on CPAP   . Pneumonia 09/2011  . Substance abuse ([place]) 8/31/213   cociane "first time use"  . Tobacco abuse     Current Facility-Administered Medications  Medication Dose Route Frequency Provider Last Rate Last Dose  . acetaminophen (TYLENOL) tablet 650 mg  650 mg Oral Q4H PRN Kathi Ludwig, MD       Or  . acetaminophen (TYLENOL) solution 650 mg  650 mg Per Tube Q4H PRN Kathi Ludwig, MD       Or  . acetaminophen (TYLENOL) suppository 650 mg  650 mg Rectal Q4H PRN Kathi Ludwig, MD      . apixaban Arne Cleveland) tablet 5 mg  5 mg Oral BID Earlene Plater, MD   5 mg at 10/16/18 0914  . atorvastatin (LIPITOR) tablet 80 mg  80 mg Oral q1800 Rosalin Hawking, MD   80 mg at 10/15/18 1713  . carvedilol (COREG) tablet 6.25 mg  6.25 mg Oral BID WC Larey Dresser, MD   6.25 mg at 10/16/18 0914  . hydrALAZINE (APRESOLINE) injection 5 mg  5 mg Intravenous Q4H PRN Earlene Plater, MD   5 mg at 10/15/18 1415  . senna-docusate (Senokot-S) tablet 1 tablet  1 tablet Oral QHS PRN Kathi Ludwig, MD      .  torsemide (DEMADEX) tablet 80 mg  80 mg Oral Daily Larey Dresser, MD   80 mg at 10/16/18 [id number]    ALLERGIES:   Lisinopril   SOCIAL HISTORY:  The patient  reports that he quit smoking about 4 years ago. His smoking use included cigarettes. He has a 4.50 pack-year smoking history. He has never used smokeless tobacco. He reports that he does not drink alcohol or use drugs.   FAMILY HISTORY:  The patient's family history includes Cancer in his paternal grandfather; Heart disease in his mother; Hypertension in his father.   REVIEW OF SYSTEMS:  Positive for fatigue.   All other systems are reviewed and negative.   PHYSICAL EXAM: VS:  BP (!) 130/109 (BP Location: Left Arm)   Pulse  62   Temp 98.7 F (37.1 C) (Oral)   Resp 17   Ht 5\' 3"  (1.6 m)   Wt 123.6 kg   SpO2 96%   BMI 48.27 kg/m  , BMI Body mass index is 48.27 kg/m. GEN: Well nourished, well developed, obese gentelman in no acute distress HEENT: normal Neck: No JVD. carotids 2+ without bruits or masses Cardiac: The heart is RRR without murmurs, rubs, or gallops. No edema. Pedal pulses 2+ = bilaterally  Respiratory:  clear to auscultation bilaterally GI: soft, nontender, nondistended, + BS MS: no deformity or atrophy Skin: warm and dry, no rash Neuro:  Strength and sensation are intact Psych: euthymic mood, full affect  RECENT LABS: 10/09/2018: B Natriuretic Peptide 392.2 10/14/2018: ALT 16 10/15/2018: TSH 0.419 10/16/2018: BUN 14; Creatinine, Ser 1.55; Hemoglobin 17.6; Platelets 95; Potassium 4.5; Sodium 138  10/14/2018: Cholesterol 124; HDL 35; LDL Cholesterol 77; Total CHOL/HDL Ratio 3.5; Triglycerides 59; VLDL 12   Estimated Creatinine Clearance: 78.7 mL/min (A) (by C-G formula based on SCr of 1.55 mg/dL (H)).   Wt Readings from Last 3 Encounters:  10/14/18 123.6 kg  10/11/18 122.8 kg  10/10/18 123.4 kg     PERTINENT STUDIES: Echo: IMPRESSIONS    1. Left ventricular ejection fraction, by visual estimation, is 40 to 45%. The left ventricle has mild to moderately decreased function. Normal left ventricular size. There is moderately increased left ventricular hypertrophy.  2. Global right ventricle has severely reduced systolic function.The right ventricular size is severely enlarged. No increase in right ventricular wall thickness.  3. Left atrial size was normal.  4. Right atrial size was severely dilated.  5. The mitral valve is normal in structure. Trace mitral valve regurgitation. No evidence of mitral stenosis.  6. The tricuspid valve is normal in structure. Tricuspid valve regurgitation is mild.  7. The aortic valve is normal in structure. Aortic valve regurgitation was not visualized by  color flow Doppler. Structurally normal aortic valve, with no evidence of sclerosis or stenosis.  8. The pulmonic valve was normal in structure. Pulmonic valve regurgitation is not visualized by color flow Doppler.  9. Moderately elevated pulmonary artery systolic pressure. 10. The inferior vena cava is normal in size with greater than 50% respiratory variability, suggesting right atrial pressure of 3 mmHg.  11. Since the last study on 06/18/2018 LVEF has decreased from 55-60% to 40-45%. Right ventricle is now severely dilated with severely decreased systolic function, previously only mild dysfunction.   MRI Brain: IMPRESSION: 1. Small acute left MCA infarct. 2. Chronic left PICA and right parieto-occipital infarcts.   TEE: IMPRESSIONS    1. Left ventricular ejection fraction, by visual estimation, is 55%. The left ventricle has normal function. Normal  left ventricular size. There is moderately increased left ventricular hypertrophy.  2. Global right ventricle has moderately reduced systolic function.The right ventricular size is severely enlarged. Mildly increased right ventricular wall thickness. D-shaped septum suggestive of RV pressure/volume overload.  3. Trivial pericardial effusion is present.  4. The tricuspid valve is normal in structure. Tricuspid valve regurgitation is mild. Peak RV-RA gradient 38 mmHg.  5. Mild left atrial enlargement. There turbulent flow into the left atrium adjacent to the right upper pulmonary vein. The flow is continous by CW doppler but does not appear to be an ASD with right to left shunting (does not originate from the RA). It  possibly represents ?a compressed pulmonary vein. Needs further evaluation.  6. Evidence of atrial level shunting detected by color flow Doppler. Patient has a moderate PFO with bidirectional flow. Positive bubble study.  7. Severe right atrial enlargement. There is spontaneous contrast noted in the RA suggesive of low flow state  and risk of thrombosis.  8. The aortic root was not well visualized.  9. The aortic valve is tricuspid Aortic valve regurgitation was not visualized by color flow Doppler. Structurally normal aortic valve, with no evidence of sclerosis or stenosis. 10. The mitral valve is normal in structure. Trace mitral valve regurgitation. No evidence of mitral stenosis.  11. The pulmonic valve was normal in structure. Pulmonic valve regurgitation is not visualized by color flow Doppler.  ASSESSMENT AND PLAN: 1.  PFO in 33 year old gentleman with advanced right heart failure who presents with acute left CA stroke.   Radiographic results, hospital notes, lab results, and cardiac imaging data are reviewed. TEE images are reviewed and demonstrate marked RV dysfunction, RA enlargement, spontaneous contrast in the RA, and moderate PFO with primarily right to left shunting.  The patient does have multiple comorbid medical conditions, but obviously is a young patient without advanced atherosclerotic disease.  Considering his cardiac findings, PFO is a likely culprit for paradoxical emboli and stroke.  The patient is counseled about the association of PFO and cryptogenic stroke. Available clinical trial data is reviewed, specifically those trials comparing transcatheter PFO closure and medical therapy with antiplatelet drugs. The patient understands the potential benefit of PFO closure with respect to secondary stroke reduction compared with medical therapy alone. Specific risks of transcatheter PFO closure are reviewed with the patient.  The patient has been started on apixaban for anticoagulation.  He would like to review the procedure in more detail once he is discharged from the hospital.  I will demonstrate the devices to him and further review specific risks and expectations with transcatheter PFO closure when I see him back in the office.  I will plan to see him back 3 to 4 weeks after discharge and would anticipate  moving forward with transcatheter PFO closure at that time.  All of his questions are answered today.  Sherren Mocha 10/16/2018 5:04 PM

## 2018-10-16 NOTE — Care Management (Signed)
For Bariatric 3 in 1:   Bariatric size--due to patients height and weight distribution he will require a bigger size to accommodate his body shape.

## 2018-10-17 ENCOUNTER — Encounter (HOSPITAL_COMMUNITY): Payer: Self-pay | Admitting: Cardiology

## 2018-10-17 LAB — BASIC METABOLIC PANEL
Anion gap: 11 (ref 5–15)
BUN: 17 mg/dL (ref 6–20)
CO2: 36 mmol/L — ABNORMAL HIGH (ref 22–32)
Calcium: 8.5 mg/dL — ABNORMAL LOW (ref 8.9–10.3)
Chloride: 91 mmol/L — ABNORMAL LOW (ref 98–111)
Creatinine, Ser: 1.45 mg/dL — ABNORMAL HIGH (ref 0.61–1.24)
GFR calc Af Amer: >60 mL/min (ref >60)
GFR calc non Af Amer: >60 mL/min (ref >60)
Glucose, Bld: 75 mg/dL (ref 70–99)
Potassium: 5.1 mmol/L (ref 3.5–5.1)
Sodium: 138 mmol/L (ref 135–145)

## 2018-10-17 LAB — CBC
HCT: 61.6 % — ABNORMAL HIGH (ref 39.0–52.0)
Hemoglobin: 17.8 g/dL — ABNORMAL HIGH (ref 13.0–17.0)
MCH: 28.3 pg (ref 26.0–34.0)
MCHC: 28.9 g/dL — ABNORMAL LOW (ref 30.0–36.0)
MCV: 97.8 fL (ref 80.0–100.0)
Platelets: 100 10*3/uL — ABNORMAL LOW (ref 150–400)
RBC: 6.3 MIL/uL — ABNORMAL HIGH (ref 4.22–5.81)
RDW: 15.7 % — ABNORMAL HIGH (ref 11.5–15.5)
WBC: 6.2 10*3/uL (ref 4.0–10.5)
nRBC: 0 % (ref 0.0–0.2)

## 2018-10-17 LAB — BETA-2-GLYCOPROTEIN I ABS, IGG/M/A
Beta-2 Glyco I IgG: <9 GPI IgG units (ref 0–20)
Beta-2-Glycoprotein I IgA: <9 GPI IgA units (ref 0–25)
Beta-2-Glycoprotein I IgM: <9 GPI IgM units (ref 0–32)

## 2018-10-17 LAB — CARDIOLIPIN ANTIBODIES, IGG, IGM, IGA
Anticardiolipin IgA: <9 APL U/mL (ref 0–11)
Anticardiolipin IgG: <9 GPL U/mL (ref 0–14)
Anticardiolipin IgM: <9 MPL U/mL (ref 0–12)

## 2018-10-17 MED ORDER — APIXABAN 5 MG PO TABS: 5.0000 mg | ORAL_TABLET | Freq: Two times a day (BID) | ORAL | 3 refills | Status: DC

## 2018-10-17 MED ORDER — ROSUVASTATIN CALCIUM 5 MG PO TABS: 10.0000 mg | ORAL_TABLET | Freq: Every day | ORAL | Status: DC

## 2018-10-17 MED ORDER — HYDRALAZINE HCL 25 MG PO TABS
25.0000 mg | ORAL_TABLET | Freq: Three times a day (TID) | ORAL | Status: DC
  Administered 2018-10-17: 25 mg via ORAL
  Filled 2018-10-17: qty 1

## 2018-10-17 MED ORDER — ATORVASTATIN CALCIUM 80 MG PO TABS: 80.0000 mg | ORAL_TABLET | Freq: Every day | ORAL | 11 refills | Status: DC

## 2018-10-17 MED ORDER — TORSEMIDE 20 MG PO TABS: 80.0000 mg | ORAL_TABLET | Freq: Every day | ORAL | 3 refills | Status: DC

## 2018-10-17 MED FILL — TORSEMIDE 20 MG TABLET: 20 | 30 days supply | Qty: 120 | Fill #0

## 2018-10-17 MED FILL — ATORVASTATIN CALCIUM 80 MG: 80 | 30 days supply | Qty: 30 | Fill #0

## 2018-10-17 MED FILL — ELIQUIS 5 MG TABLET: 5 | 30 days supply | Qty: 60 | Fill #0

## 2018-10-17 NOTE — TOC Transition Note (Signed)
Transition of Care [facility]) - CM/SW Discharge Note   Patient Details  Name: Douglas [name]. MRN: [id number] Date of Birth: 17-May-1985  Transition of Care [facility]) CM/SW Contact:  Pollie Friar, RN Phone Number: 10/17/2018, 12:56 PM   Clinical Narrative:    Pt is discharging home and will f/u with [facility] MD's. Pt in agreement with Wylie Hail for outpatient therapy. Orders in Epic and information on the AVS. TOC pharmacy to deliver meds to the patient.  Sister to provide transport home and has oxygen in the car.    Final next level of care: OP Rehab Barriers to Discharge: No Barriers Identified   Patient Goals and CMS Choice     Choice offered to / list presented to : Patient  Discharge Placement                       Discharge Plan and Services                DME Arranged: 3-N-1, Walker rolling   Date DME Agency Contacted: 10/16/18   Representative spoke with at DME Agency: [place] ([facility]) Interventions     Readmission Risk Interventions No flowsheet data found.

## 2018-10-17 NOTE — Plan of Care (Signed)
Adequate for discharge.

## 2018-10-17 NOTE — Progress Notes (Signed)
Patient ID: Douglas [name]., male   DOB: 07-29-1985, 33 y.o.   MRN: [id number]     Advanced Heart Failure Rounding Note  PCP-Cardiologist: No primary care provider on file.   Subjective:    No complaints today.    Coronary CTA:  1. Coronary artery calcium score 68 Agatston units. This places the patient in the 96th percentile for age and gender, suggesting high risk for future cardiac events. 2. A large branch of the right upper pulmonary vein was compressed between the right branch pulmonary artery and the SVC, this likely caused the turbulent flow into the LA seen on TEE. 3.  Nonobstructive coronary disease in the proximal LAD.  Objective:   Weight Range: 121.3 kg Body mass index is 47.37 kg/m.   Vital Signs:   Temp:  [97.9 F (36.6 C)-98.9 F (37.2 C)] 98.9 F (37.2 C) (09/23 0838) Pulse Rate:  [59-76] 76 (09/23 0838) Resp:  [17-18] 18 (09/23 0838) BP: (119-141)/(77-109) 129/95 (09/23 0838) SpO2:  [90 %-100 %] 95 % (09/23 0838) Weight:  [121.3 kg] 121.3 kg (09/23 0457) Last BM Date: 10/16/18  Weight change: Filed Weights   10/14/18 1135 10/17/18 0457  Weight: 123.6 kg 121.3 kg    Intake/Output:   Intake/Output Summary (Last 24 hours) at 10/17/2018 0957 Last data filed at 10/16/2018 2107 Gross per 24 hour  Intake 520 ml  Output 250 ml  Net 270 ml      Physical Exam    General: NAD Neck: JVP not elevated, no thyromegaly or thyroid nodule.  Lungs: Clear to auscultation bilaterally with normal respiratory effort. CV: Nondisplaced PMI.  Heart regular S1/S2, no S3/S4, no murmur. 1+ ankle edema.   Abdomen: Soft, nontender, no hepatosplenomegaly, no distention.  Skin: Intact without lesions or rashes.  Neurologic: Alert and oriented x 3.  Psych: Normal affect. Extremities: No clubbing or cyanosis.  HEENT: Normal.    Telemetry   SR 60s personally reviewed.   EKG    N/a   Labs    CBC Recent Labs    10/16/18 0416 10/17/18 0432  WBC 6.8  6.2  HGB 17.6* 17.8*  HCT 62.4* 61.6*  MCV 98.7 97.8  PLT 95* 123XX123*   Basic Metabolic Panel Recent Labs    10/16/18 0416 10/17/18 0432  NA 138 138  K 4.5 5.1  CL 93* 91*  CO2 33* 36*  GLUCOSE 103* 75  BUN 14 17  CREATININE 1.55* 1.45*  CALCIUM 8.7* 8.5*   Liver Function Tests No results for input(s): AST, ALT, ALKPHOS, BILITOT, PROT, ALBUMIN in the last 72 hours. No results for input(s): LIPASE, AMYLASE in the last 72 hours. Cardiac Enzymes No results for input(s): CKTOTAL, CKMB, CKMBINDEX, TROPONINI in the last 72 hours.  BNP: BNP (last 3 results) Recent Labs    06/22/18 1429 10/09/18 2034  BNP 307.1* 392.2*    ProBNP (last 3 results) No results for input(s): PROBNP in the last 8760 hours.   D-Dimer No results for input(s): DDIMER in the last 72 hours. Hemoglobin A1C No results for input(s): HGBA1C in the last 72 hours. Fasting Lipid Panel No results for input(s): CHOL, HDL, LDLCALC, TRIG, CHOLHDL, LDLDIRECT in the last 72 hours. Thyroid Function Tests Recent Labs    10/15/18 0353  TSH 0.419    Other results:   Imaging    Ct Coronary Morph W/cta Cor W/score W/ca W/cm &/or Wo/cm  Addendum Date: 10/16/2018   ADDENDUM REPORT: 10/16/2018 16:47 CLINICAL DATA:  Abnormal pulmonary vein  EXAM: Cardiac CTA MEDICATIONS: Sub lingual nitro. 4mg  x 2 TECHNIQUE: The patient was scanned on a Siemens [id number] slice scanner. Gantry rotation speed was 250 msecs. Collimation was 0.6 mm. A 100 kV prospective scan was triggered in the ascending thoracic aorta at 35-75% of the R-R interval. Average HR during the scan was 60 bpm. The 3D data set was interpreted on a dedicated work station using MPR, MIP and VRT modes. A total of 80cc of contrast was used. FINDINGS: Non-cardiac: See separate report from [facility]. The pulmonary artery was dilated. The pulmonary veins drained normally to the left atrium. A large branch of the right upper pulmonary vein was compressed between  the right branch pulmonary artery and the SVC, this likely caused the turbulent flow into the LA seen on TEE. The right ventricle and right atrium were dilated. Possible PFO noted. Calcium Score: 68 Agatston units. Coronary Arteries: Right dominant with no anomalies LM: No plaque or stenosis. LAD system: Large tortuous vessel. Calcified plaque proximal LAD, no significant stenosis. Circumflex system: Large tortuous vessel, no plaque or stenosis. RCA system: Large tortuous vessel, no plaque or stenosis. IMPRESSION: 1. Coronary artery calcium score 68 Agatston units. This places the patient in the 96th percentile for age and gender, suggesting high risk for future cardiac events. 2. A large branch of the right upper pulmonary vein was compressed between the right branch pulmonary artery and the SVC, this likely caused the turbulent flow into the LA seen on TEE. 3.  Nonobstructive coronary disease in the proximal LAD. Deron Poole Electronically Signed   By: [name] M.D.   On: 10/16/2018 16:47   Result Date: 10/16/2018 EXAM: OVER-READ INTERPRETATION  CT CHEST The following report is an over-read performed by radiologist Dr. Suzy Bouchard of [facility], [place] on 10/16/2018. This over-read does not include interpretation of cardiac or coronary anatomy or pathology. The coronary CTA interpretation by the cardiologist is attached. COMPARISON:  None. FINDINGS: Limited view of the lung parenchyma demonstrates no suspicious nodularity. Airways are normal. Limited view of the mediastinum demonstrates no adenopathy. Esophagus normal. Limited view of the upper abdomen unremarkable. Limited view of the skeleton and chest wall is unremarkable. IMPRESSION: No significant extracardiac findings. Electronically Signed: By: Suzy Bouchard M.D. On: 10/16/2018 15:57     Medications:     Scheduled Medications: . apixaban  5 mg Oral BID  . atorvastatin  80 mg Oral q1800  . carvedilol  6.25 mg Oral BID WC  .  hydrALAZINE  25 mg Oral TID  . torsemide  80 mg Oral Daily    Infusions:   PRN Medications: acetaminophen **OR** acetaminophen (TYLENOL) oral liquid 160 mg/5 mL **OR** acetaminophen, hydrALAZINE, senna-docusate     Assessment/Plan   1. Chronic diastolic CHF with prominent RV dysfunction/cor pulmonale: TEE showed a severely dilated and moderately dysfunctional RV with D-shaped septum.  Suspect long-standing cor pulmonale due to incompletely treated OHS/OSA.  He has secondary polycythemia.  He has not been using CPAP at home. Volume status stable.  - Continue torsemide 80 mg daily.  2. CVA: I suspect that he had a cardio-embolic CVA.  Slow flow, swirling with spontaneous contrast noted in the severely dilated RA. There was a moderate-sized PFO with bidirectional shunting.  Strongly suspect this was the source of his CVA. Atrial fibrillation has not been seen, no LA appendage thrombus.  No DVT on venous dopplers.  - He needs systemic anticoagulation, continue apixaban.  - Patient to followup with Dr. Burt Knack  after discharge to discuss PFO closure.   3. OHS/OSA: Inadequately treated.  See above, needs to use CPAP every night.  Using 2L home oxygen.  - Will need to get back on CPAP after discharge as soon as possible, sounds like Dr. Halford Chessman was his sleep medicine MD.  4. HTN: Home meds (Coreg and hydralazine) restarted.  5. Abnormal flow into left atrium: Noted on TEE, turbulent flow into LA adjacent to the right upper pulmonary vein (continuous).  From coronary CTA yesterday, it appears that the turbulent flow comes from a large branch of the right upper pulmonary vein that is compressed between the dilated right pulmonary artery and the SVC.  There is nothing that needs to be done about this.  6. Coronary artery disease: Nonobstructive but age-advanced.  - He is on statin.  - No ASA due to Eliquis use.   Plan for home today, we will arrange CHF clinic followup.   Length of Stay: 3  [name], MD  10/17/2018, 9:57 AM  Advanced Heart Failure Team Pager [phone] (M-F; 7a - 4p)  Please contact [facility] for night-coverage after hours (4p -7a ) and weekends on [email or web address]

## 2018-10-17 NOTE — Progress Notes (Signed)
Subjective: Patient seen at the bedside on rounds this morning. Has no complaints at this time. We discussed how he will need to be on the Eliquis long-term to prevent clots from forming. Patient endorses understanding.  He understands that the stroke was related to the PFO and will need to be closed. He will follow-up with Dr. Burt Knack in the outpatient setting. Patient states he does not have a PCP and is open to following up in the outpatient clinic with [place].  Objective: CT coronaries: IMPRESSION: 1. Coronary artery calcium score 68 Agatston units. This places the patient in the 96th percentile for age and gender, suggesting high risk for future cardiac events.  2. A large branch of the right upper pulmonary vein was compressed between the right branch pulmonary artery and the SVC, this likely caused the turbulent flow into the LA seen on TEE.  3.  Nonobstructive coronary disease in the proximal LAD.  Vital signs in last 24 hours: Vitals:   10/16/18 2358 10/17/18 0402 10/17/18 0457 10/17/18 0838  BP: (!) 134/93 (!) 140/101  (!) 129/95  Pulse: 60 73  76  Resp:    18  Temp: 98.7 F (37.1 C) 97.9 F (36.6 C)  98.9 F (37.2 C)  TempSrc: Oral Axillary  Oral  SpO2: 100% 100%  95%  Weight:   121.3 kg   Height:       Physical Exam: General: Sitting up at the side of the bed HEENT: NCAT, [place] in place CV: RRR, normal S1 and S2 no murmurs rubs or gallops, 2+ pitting edema bilateral lower extremities PULM: Clear to auscultation bilaterally, no crackles appreciated ABD: Nontender in all quadrants and nondistended  NEURO: Mental Status: Patient is awake, alert, oriented x3 No facial droop Cranial Nerves: II: Pupils equal, round, and reactive to light.   III,IV, VI: EOMI without ptosis or diploplia.  V: Facial sensation is symmetric to light touch VII: Facial movement is symmetric VIII: hearing is intact to voice X: Uvula elevates symmetrically XI: Shoulder shrug is symmetric  XII: tongue is midline without atrophy or fasciculations Motor: 5/5 effort throughout upper and lower extremities bilaterally  Assessment/Plan:  Active Problems:   Cor pulmonale, chronic (HCC)   CVA (cerebral vascular accident) ([place])  In summary, [name] is a 33 year old male with a history of CHF (EF 55-60% last week) on 2L O2 at baseline, HTN, polysubstance abuse, obesity, and OSA who presented with left-sided weakness slurred speech and facial droop, and head imaging notable for M2/M3 vessel occlusion consistent with an acute stroke. The patient has mild slurred speech but his left-sided weakness and facial droop has improved markedly.TEE was notable for PFO, high suspicion for paradoxical emboli causing stroke. The patient will need PFO closure and anticoagulation moving forward.  At this point the patient is medically stable for discharge.   #PFO suspected paradoxical emboli #CVA: L sided M2/M3 occlusion appreciated on CT angio of the head. MRI significant for small acute left MCA infarct. TEE significant for severely dilated right atrium, PFO with bidirectional flow highly suspicious for paradoxical emboli. Patient will follow-up with Dr. Burt Knack for PFO closure in the outpatient setting.  CT coronaries not show pathology we need to intervene on at this time. - Lupus anticoagulant and homocysteine normal - Hypercoagulability studies pending (beta-2-glycoprotein antibodies, cardiolipin antibodies)  - Continue Eliquis 5 mg twice daily - PT/OT/SLP consults recommend outpatient therapy. - A1c m 5.6, metformin not indicated at this time - Continue atorvastatin 80 mg daily   #  HTN #CHF: (EF of 55-60% at cardiology visit last week) patient is on 2 L of oxygen at baseline. Patient takes carvedilol 6.25 mg daily, hydralazine 25 mg TID, metolazone 2.5 mg daily at home.  Also started on torsemide 60 mg daily at last heart failure clinic visit a few days ago. - Discharge with carvedilol 6.25  twice daily and torsemide 80 mg per CHF team recs  - Start home hydralazine 25 mg 3 times daily - Supplemental O2 PRN. Currently on home 2L  #FEN/GI - IV fluids: None - Diet: Low sodium  #DVT prophylaxis  -  Eliquis 5 mg twice daily  #Code status: FULL   #Dispo: Discharge home today. Following up in our primary care clinic on 9/30.  Douglas Plater, MD Internal Medicine, PGY1 Pager: [phone]  10/17/2018,11:48 AM

## 2018-10-17 NOTE — Progress Notes (Signed)
Patient discharged home with DME equipment

## 2018-10-17 NOTE — Progress Notes (Signed)
Occupational Therapy Treatment Patient Details Name: Douglas [name]. MRN: [id number] DOB: 1985/12/23 Today's Date: 10/17/2018    History of present illness Mr. Gericke is a 33 year old male with a past medical history significant for CHF (EF 55-60% last week) on 2 L O2 at baseline, HTN, polysubstance abuse, obesity, and OSA who presented to the ED today after experiencing right arm weakness, right leg weakness, and slurred speech when he woke up this morning. MRI showed L MCA infarct as well as chronic L posterior circulation infarcts   OT comments  Pt RUE [facility] handout provided with exercises and red theraputty exercises provided with handout. Pt performing with handouts. Pt performing ADL functional mobility with supervisionA. Pt would benefit from continued OT. OT following acutely.   Follow Up Recommendations  Outpatient OT    Equipment Recommendations  3 in 1 bedside commode    Recommendations for Other Services      Precautions / Restrictions Precautions Precautions: Fall Restrictions Weight Bearing Restrictions: No       Mobility Bed Mobility               General bed mobility comments: pt sitting EOB upon arrival  Transfers Overall transfer level: Needs assistance Equipment used: None Transfers: Sit to/from Stand Sit to Stand: Supervision         General transfer comment: supervision for safety    Balance Overall balance assessment: Needs assistance Sitting-balance support: Feet supported Sitting balance-Leahy Scale: Good     Standing balance support: During functional activity Standing balance-Leahy Scale: Fair                             ADL either performed or assessed with clinical judgement   ADL Overall ADL's : Needs assistance/impaired                                     Functional mobility during ADLs: Supervision/safety;Rolling walker General ADL Comments: MinguardA for ADL     Vision        Perception     Praxis      Cognition Arousal/Alertness: Awake/alert Behavior During Therapy: WFL for tasks assessed/performed Overall Cognitive Status: Within Functional Limits for tasks assessed                                          Exercises     Shoulder Instructions       General Comments      Pertinent Vitals/ Pain       Pain Assessment: No/denies pain  Home Living                                          Prior Functioning/Environment              Frequency  Min 2X/week        Progress Toward Goals  OT Goals(current goals can now be found in the care plan section)  Progress towards OT goals: Progressing toward goals  Acute Rehab OT Goals Patient Stated Goal: get better OT Goal Formulation: With patient Time For Goal Achievement: 10/30/18 Potential to Achieve Goals: Good ADL Goals Pt/caregiver will Perform Home Exercise  Program: Right Upper extremity;With written HEP provided Additional ADL Goal #1: Pt will recall and/or apply 1-3 ECS strategies while engaging in BADL task for successful completion  Plan Discharge plan remains appropriate    Co-evaluation                 AM-PAC OT "6 Clicks" Daily Activity     Outcome Measure   Help from another person eating meals?: None Help from another person taking care of personal grooming?: A Little Help from another person toileting, which includes using toliet, bedpan, or urinal?: A Little Help from another person bathing (including washing, rinsing, drying)?: A Little Help from another person to put on and taking off regular upper body clothing?: None Help from another person to put on and taking off regular lower body clothing?: A Little 6 Click Score: 20    End of Session Equipment Utilized During Treatment: Rolling walker  OT Visit Diagnosis: Other abnormalities of gait and mobility (R26.89);Other symptoms and signs involving the nervous system  (R29.898)   Activity Tolerance Patient tolerated treatment well   Patient Left in bed;with call bell/phone within reach   Nurse Communication Mobility status        Time: [id number] OT Time Calculation (min): 25 min  Charges: OT General Charges $OT Visit: 1 Visit OT Treatments $Self Care/Home Management : 8-22 mins $Neuromuscular Re-education: 8-22 mins  Darryl Nestle) Marsa Aris OTR/L Acute Rehabilitation Services Pager: [phone] Office: [phone]    Audie Pinto 10/17/2018, 5:11 PM

## 2018-10-17 NOTE — Progress Notes (Signed)
Physical Therapy Treatment Patient Details Name: Douglas [name]. MRN: [id number] DOB: [birth year] Today's Date: 10/17/2018    History of Present Illness Douglas [name] is a 33 year old male with a past medical history significant for CHF (EF 55-60% last week) on 2 L O2 at baseline, HTN, polysubstance abuse, obesity, and OSA who presented to the ED today after experiencing right arm weakness, right leg weakness, and slurred speech when he woke up this morning. MRI showed L MCA infarct as well as chronic L posterior circulation infarcts    PT Comments    Patient seen for mobility progression. Pt requires min guard for stair training and min A for gait training without AD. Pt presents with impaired balance, R LE weakness, and LOB X 2 with assist needed to recover. Pt is aware of need to use RW upon d/c to decrease risk of falls. Current plan remains appropriate.     Follow Up Recommendations  Outpatient PT     Equipment Recommendations  Rolling walker with 5" wheels    Recommendations for Other Services       Precautions / Restrictions Precautions Precautions: Fall Restrictions Weight Bearing Restrictions: No    Mobility  Bed Mobility               General bed mobility comments: pt sitting EOB upon arrival  Transfers Overall transfer level: Needs assistance Equipment used: None Transfers: Sit to/from Stand Sit to Stand: Supervision         General transfer comment: supervision for safety  Ambulation/Gait Ambulation/Gait assistance: Min assist Gait Distance (Feet): 250 Feet Assistive device: None(assist at trunk with gait belt) Gait Pattern/deviations: Decreased weight shift to right;Wide base of support;Step-through pattern;Decreased stride length;Drifts right/left     General Gait Details: assitance required for balance; pt with LOB X 2 and required increased assist to recover; decreased cadence   Stairs Stairs: Yes Stairs assistance: Min  guard Stair Management: One rail Right;Two rails;Alternating pattern;Step to pattern;Forwards Number of Stairs: 12 General stair comments: cues for sequencing and technique; min guard for safety with UE support    Wheelchair Mobility    Modified Rankin (Stroke Patients Only) Modified Rankin (Stroke Patients Only) Pre-Morbid Rankin Score: No symptoms Modified Rankin: Moderately severe disability     Balance Overall balance assessment: Needs assistance Sitting-balance support: Feet supported Sitting balance-Leahy Scale: Good     Standing balance support: During functional activity Standing balance-Leahy Scale: Poor                              Cognition Arousal/Alertness: Awake/alert Behavior During Therapy: WFL for tasks assessed/performed Overall Cognitive Status: Within Functional Limits for tasks assessed                                        Exercises      General Comments General comments (skin integrity, edema, etc.): SpO2 90% or > on 2L O2 via [place]      Pertinent Vitals/Pain Pain Assessment: No/denies pain    Home Living                      Prior Function            PT Goals (current goals can now be found in the care plan section) Acute Rehab PT Goals Patient Stated Goal: get better Progress  towards PT goals: Progressing toward goals    Frequency    Min 4X/week      PT Plan Current plan remains appropriate    Co-evaluation              AM-PAC PT "6 Clicks" Mobility   Outcome Measure  Help needed turning from your back to your side while in a flat bed without using bedrails?: None Help needed moving from lying on your back to sitting on the side of a flat bed without using bedrails?: None Help needed moving to and from a bed to a chair (including a wheelchair)?: A Little Help needed standing up from a chair using your arms (e.g., wheelchair or bedside chair)?: None Help needed to walk in hospital  room?: A Little Help needed climbing 3-5 steps with a railing? : A Little 6 Click Score: 21    End of Session Equipment Utilized During Treatment: Gait belt;Oxygen Activity Tolerance: Patient tolerated treatment well Patient left: in chair;with call bell/phone within reach;with chair alarm set Nurse Communication: Mobility status PT Visit Diagnosis: Muscle weakness (generalized) (M62.81);Other abnormalities of gait and mobility (R26.89)     Time: 1130-1151 PT Time Calculation (min) (ACUTE ONLY): 21 min  Charges:  $Gait Training: 8-22 mins                     Earney Navy, PTA Acute Rehabilitation Services Pager: [phone] Office: [phone]     Darliss Cheney 10/17/2018, 12:02 PM

## 2018-10-18 ENCOUNTER — Other Ambulatory Visit: Payer: Self-pay

## 2018-10-18 ENCOUNTER — Telehealth: Payer: Self-pay

## 2018-10-18 ENCOUNTER — Ambulatory Visit (HOSPITAL_COMMUNITY)
Admission: RE | Admit: 2018-10-18 | Discharge: 2018-10-18 | Disposition: A | Discharge status: Home / Self Care | Payer: Medicaid Other | Source: Ambulatory Visit | Attending: Cardiology | Admitting: Cardiology

## 2018-10-18 DIAGNOSIS — I5032 Chronic diastolic (congestive) heart failure: Secondary | ICD-10-CM | POA: Diagnosis not present

## 2018-10-18 LAB — BASIC METABOLIC PANEL
Anion gap: 15 (ref 5–15)
BUN: 13 mg/dL (ref 6–20)
CO2: 33 mmol/L — ABNORMAL HIGH (ref 22–32)
Calcium: 8.6 mg/dL — ABNORMAL LOW (ref 8.9–10.3)
Chloride: 91 mmol/L — ABNORMAL LOW (ref 98–111)
Creatinine, Ser: 1.35 mg/dL — ABNORMAL HIGH (ref 0.61–1.24)
GFR calc Af Amer: >60 mL/min (ref >60)
GFR calc non Af Amer: >60 mL/min (ref >60)
Glucose, Bld: 87 mg/dL (ref 70–99)
Potassium: 4.4 mmol/L (ref 3.5–5.1)
Sodium: 139 mmol/L (ref 135–145)

## 2018-10-18 NOTE — Discharge Summary (Signed)
Name: Douglas [name]. MRN: [id number] DOB: 02/22/1985 33 y.o. PCP: Medicine, [facility]  Date of Admission: 10/14/2018  6:09 AM Date of Discharge: 10/17/2018 Attending Physician: Aldine Contes, MD  Discharge Diagnosis: 1. L MCA stroke secondary to PFO paradoxical emboli 2. CHF 3. Sleep apnea  Discharge Medications: Allergies as of 10/17/2018      Reactions   Lisinopril Cough      Medication List    TAKE these medications   apixaban 5 MG Tabs tablet Commonly known as: ELIQUIS Take 1 tablet (5 mg total) by mouth 2 (two) times daily.   atorvastatin 80 MG tablet Commonly known as: LIPITOR Take 1 tablet (80 mg total) by mouth daily at 6 PM.   carvedilol 6.25 MG tablet Commonly known as: COREG TAKE 1 AND 1/2 TABLET BY MOUTH TWICE A DAY What changed: See the new instructions.   hydrALAZINE 25 MG tablet Commonly known as: APRESOLINE Take 1 tablet (25 mg total) by mouth 3 (three) times daily.   metolazone 2.5 MG tablet Commonly known as: ZAROXOLYN Take 1 tablet (2.5 mg total) by mouth as needed.   OXYGEN Inhale 2 L/min into the lungs continuous.   Proventil HFA 108 (90 Base) MCG/ACT inhaler Generic drug: albuterol INHALE 1 TO 2 PUFFS BY MOUTH EVERY 6 (SIX) HOURS AS NEEDED FOR WHEEZING OR SHORTNESS OF BREATH What changed: See the new instructions.   torsemide 20 MG tablet Commonly known as: DEMADEX Take 4 tablets (80 mg total) by mouth daily. What changed: how much to take      Disposition and follow-up:   Douglas D Koepp Jr. was discharged from [facility] in Stable condition. At the hospital follow up visit please address:  1. L MCA stroke secondary to PFO paradoxical emboli: Presented with left-sided weakness slurred speech and facial droop,and head imaging notable for M2/M3 vessel occlusion consistent with an acute stroke. Weakness & facial droop improved to baseline. His main deficits are mostly balance related.  PFO with bidirectional flow was found on TEE, suggesting stroke was related to paradoxical emboli.  DVT studies negative. - Eliquis 5 mg twice daily per Neurology & cardiology recs - Follow-up appointment with Dr. Burt Knack for PFO closure on 11/16/2018 - Outpatient physical therapy  2. CHF (EF 55-60%): Patient on 2L of O2 at home.  Did not require more O2 supplementation in the hospital. CHF team saw the patient during the stay. TEE demonstrated PFO with bidirectional flow as well as severely dilated R atrium and R ventricle. CT coronaries were obtained and showed a large branch of the right upper pulmonary vein compressed, causing turbulent flow in the left atrium.  CHF team recommended the following medications upon discharge: - Carvedilol 6.25 mg twice daily - Hydralazine 25 mg 3 times daily - Metolazone 2.5 mg daily - Torsemide 80 mg daily - Please follow-up to make sure patient is taking all of his medications - Follow-up appointment scheduled with heart failure team on 11/23/2018  3. Sleep apnea: Please set up a sleep study so he can get a CPAP machine.  4.  Labs / imaging needed at time of follow-up: CBC, BMP  5.  Pending labs/ test needing follow-up:  Follow-up Appointments: Follow-up Information    [facility]. Schedule an appointment as soon as possible for a visit in 4 week(s).   Specialty: Neurology Contact information: [address] [place]  [facility] Follow up on 10/25/2018.   Specialty: Cardiology Why: at 2:00 [place] information: [address] [id number] [place] Follow up.   Specialty: Rehabilitation Why: The outpatient rehab will contact you for the first appointment Contact information: [place] [facility] Course by problem list: 1. L MCA stroke secondary to PFO paradoxical emboli: In summary, [name] is a 33 year old male with a history ofCHF (EF 55-60% last week) on 2L O2 at baseline,HTN, polysubstance abuse,obesity,and OSA who presented with left-sided weakness slurred speech and facial droop,and head imaging notable for M2/M3 vessel occlusion consistent with an acute stroke. The patient continued to have mild slurred speech but his left-sided weakness and facial droop has improved markedly. His main deficits are mostly balance related. TEE was notable for PFO, high suspicion for paradoxical emboli causing stroke. Neurology and cardiology were both involved in the patient's care recommended long-term anticoagulation. - Eliquis 5 mg twice daily per Neurology & cardiology recs - Follow-up appointment with Dr. Burt Knack for PFO closure on 11/16/2018 - Outpatient physical therapy  2. CHF (EF 55-60%): Patient on 2 L of oxygen at baseline, and did not require more oxygen during his hospitalization.  The patient was started on torsemide 60 mg at his last heart failure clinic visit a few days prior to admission. The heart failure team saw the patient and recommended the following: - Carvedilol 6.25 mg twice daily - Hydralazine 25 mg 3 times daily -Metolazone 2.5 mg daily - Torsemide 80 mg daily - Follow-up appointment scheduled with heart failure team on 11/23/2018  3. Sleep Apnea: Says he has not had a CPAP machine in a year.  Will need outpatient sleep study referral.   Discharge Vitals:   BP 122/74 (BP Location: Left Arm)   Pulse 67   Temp 98.4 F (36.9 C) (Oral)   Resp 20   Ht 5\' 3"  (1.6 m)   Wt 121.3 kg   SpO2 98%   BMI 47.37 kg/m   Pertinent Labs, Studies, and Procedures:  CBC Latest Ref Rng & Units 10/17/2018 10/16/2018 10/15/2018  WBC 4.0 - 10.5 K/uL 6.2 6.8 7.1  Hemoglobin 13.0 - 17.0 g/dL  17.8(H) 17.6(H) 18.0(H)  Hematocrit 39.0 - 52.0 % 61.6(H) 62.4(H) 61.6(H)  Platelets 150 - 400 K/uL 100(L) 95(L) PLATELET CLUMPS NOTED ON SMEAR, COUNT APPEARS DECREASED   BMP Latest Ref Rng & Units 10/18/2018 10/17/2018 10/16/2018  Glucose 70 - 99 mg/dL 87 75 103(H)  BUN 6 - 20 mg/dL 13 17 14   Creatinine 0.61 - 1.24 mg/dL 1.35(H) 1.45(H) 1.55(H)  Sodium 135 - 145 mmol/L 139 138 138  Potassium 3.5 - 5.1 mmol/L 4.4 5.1 4.5  Chloride 98 - 111 mmol/L 91(L) 91(L) 93(L)  CO2 22 - 32 mmol/L 33(H) 36(H) 33(H)  Calcium 8.9 - 10.3 mg/dL 8.6(L) 8.5(L) 8.7(L)   Lipid Panel     Component Value Date/Time   CHOL 124 10/14/2018 0649   TRIG 59 10/14/2018 0649   HDL 35 (L) 10/14/2018 0649   CHOLHDL 3.5 10/14/2018 0649   VLDL 12 10/14/2018 0649   LDLCALC 77 10/14/2018 0649   Hgb A1c: 5.6   Lupus anticoagulant - normal limits  Beta-2 glycoprotein - normal limits  Cardiolipin antibodies - normal limits  Imaging: CT Head: IMPRESSION: 1. Old left  cerebellar infarct. 2. ASPECTS is 10.  CT Angio Head & Neck: IMPRESSION: 1. Severe proximal left M2 stenosis and occlusion of a distal left M2/proximal M3 vessel. 2. Left frontoparietal core infarct with 19 mL penumbra. 3. Moderate proximal right PCA stenosis. 4. Widely patent cervical carotid and vertebral arteries.  MRI: IMPRESSION: 1. Small acute left MCA infarct. 2. Chronic left PICA and right parieto-occipital infarcts.  CT coronaries:  IMPRESSION: 1. Coronary artery calcium score 68 Agatston units. This places the patient in the 96th percentile for age and gender, suggesting high risk for future cardiac events.  2. A large branch of the right upper pulmonary vein was compressed between the right branch pulmonary artery and the SVC, this likely caused the turbulent flow into the LA seen on TEE.  3.  Nonobstructive coronary disease in the proximal LAD.  Lower extremity Dopplers: SUMMARY: Right: There is no evidence of deep vein  thrombosis in the lower extremity. Left: There is no evidence of deep vein thrombosis in the lower extremity.  Discharge Instructions: Discharge Instructions    (HEART FAILURE PATIENTS) Call MD:  Anytime you have any of the following symptoms: 1) 3 pound weight gain in 24 hours or 5 pounds in 1 week 2) shortness of breath, with or without a dry hacking cough 3) swelling in the hands, feet or stomach 4) if you have to sleep on extra pillows at night in order to breathe.   Complete by: As directed    Ambulatory referral to Neurology   Complete by: As directed    Follow up with stroke clinic NP (Jessica Vanschaick or Cecille Rubin, if both not available, consider Zachery Dauer, or [place]) at [facility] in about 4 weeks. Thanks.   Ambulatory referral to Occupational Therapy   Complete by: As directed    Ambulatory referral to Physical Therapy   Complete by: As directed    Call MD for:   Complete by: As directed    Call MD for:  difficulty breathing, headache or visual disturbances   Complete by: As directed    Call MD for:  extreme fatigue   Complete by: As directed    Call MD for:  hives   Complete by: As directed    Call MD for:  persistant dizziness or light-headedness   Complete by: As directed    Call MD for:  persistant nausea and vomiting   Complete by: As directed    Call MD for:  severe uncontrolled pain   Complete by: As directed    Call MD for:  temperature >100.4   Complete by: As directed    Diet - low sodium heart healthy   Complete by: As directed    Discharge instructions   Complete by: As directed    Thank you for allowing [place] to take care of you during your hospitalization.  Below is a summary of what we treated  1.  Stroke - Please take Eliquis 5 mg twice a day.  This medicine is a blood thinner and will decrease your risk of stroke.  However, there is an increased risk of bleeding so be very careful around sharp objects.  2. PFO (patient foramin ovale): This means you  have an open hole in your heart. We think that a clot may have passed through this hole and cause your stroke.  This will need to be closed in the near future. -Follow-up with Dr. Burt Knack after discharge to discuss PFO closure  3. Sleep apnea: - You  need to wear a CPAP machine at night for his sleep apnea. - Your primary care provider can set you up with a sleep study to get the equipment you need.  4.  Heart failure - Take torsemide 80 mg once daily  5. Follow up  -  Follow-up with Dr. Burt Knack about losing a hole in your heart in 3 to 4 weeks. -  Your primary care appointment is on 9/30 at 2:15 PM with the Internal Medicine Clinic.   Increase activity slowly   Complete by: As directed      Signed: Earlene Plater, MD Internal Medicine, PGY1 Pager: [phone]  10/18/2018,4:06 PM

## 2018-10-18 NOTE — Telephone Encounter (Signed)
-----   Message from Sherren Mocha, MD sent at 10/16/2018  5:18 PM EDT ----- Kirk Ruths and Cornelius Moras: I saw Mr Sapia this afternoon and will arrange a follow-up office visit in 3-4 weeks to get him set up for PFO closure.   thx Ronalee Belts

## 2018-10-18 NOTE — Telephone Encounter (Signed)
Scheduled the patient 10/23 for PFO consult with Dr. Burt Knack. He was grateful for call and agrees with treatment plan.

## 2018-10-19 ENCOUNTER — Other Ambulatory Visit (HOSPITAL_COMMUNITY): Payer: Self-pay

## 2018-10-19 ENCOUNTER — Other Ambulatory Visit (HOSPITAL_COMMUNITY): Payer: Self-pay | Admitting: *Deleted

## 2018-10-19 MED ORDER — POTASSIUM CHLORIDE CRYS ER 20 MEQ PO TBCR: 20.0000 meq | EXTENDED_RELEASE_TABLET | Freq: Every day | ORAL | 3 refills | Status: DC

## 2018-10-19 MED ORDER — TORSEMIDE 20 MG PO TABS: 80.0000 mg | ORAL_TABLET | Freq: Every day | ORAL | 3 refills | Status: DC

## 2018-10-19 NOTE — Progress Notes (Signed)
Paramedicine Encounter    Patient ID: Douglas Madura., male    DOB: 1986/01/24, 33 y.o.   MRN: [id number]   Patient Care Team: Medicine, [facility] as PCP - General  Patient Active Problem List   Diagnosis Date Noted  . CVA (cerebral vascular accident) ([place]) 10/14/2018  . Morbid obesity ([place]) 06/17/2015  . Essential hypertension 04/14/2015  . Hyperkalemia 04/14/2015  . OSA (obstructive sleep apnea)   . Difficult intravenous access   . Chronic diastolic CHF (congestive heart failure), NYHA class 3 ([place]) 09/13/2014  . Cor pulmonale, chronic ([place]) 03/19/2014  . OSA treated with BiPAP     Current Outpatient Medications:  .  apixaban (ELIQUIS) 5 MG TABS tablet, Take 1 tablet (5 mg total) by mouth 2 (two) times daily., Disp: 60 tablet, Rfl: 3 .  atorvastatin (LIPITOR) 80 MG tablet, Take 1 tablet (80 mg total) by mouth daily at 6 PM., Disp: 30 tablet, Rfl: 11 .  carvedilol (COREG) 6.25 MG tablet, TAKE 1 AND 1/2 TABLET BY MOUTH TWICE A DAY (Patient taking differently: Take 9.375 mg by mouth 2 (two) times daily with a meal. ), Disp: 90 tablet, Rfl: 5 .  hydrALAZINE (APRESOLINE) 25 MG tablet, Take 1 tablet (25 mg total) by mouth 3 (three) times daily., Disp: 270 tablet, Rfl: 1 .  OXYGEN, Inhale 2 L/min into the lungs continuous., Disp: , Rfl:  .  PROVENTIL HFA 108 (90 Base) MCG/ACT inhaler, INHALE 1 TO 2 PUFFS BY MOUTH EVERY 6 (SIX) HOURS AS NEEDED FOR WHEEZING OR SHORTNESS OF BREATH (Patient taking differently: Inhale 1-2 puffs into the lungs every 6 (six) hours as needed for wheezing. ), Disp: 6.7 g, Rfl: 0 .  torsemide (DEMADEX) 20 MG tablet, Take 4 tablets (80 mg total) by mouth daily., Disp: 120 tablet, Rfl: 3 .  metolazone (ZAROXOLYN) 2.5 MG tablet, Take 1 tablet (2.5 mg total) by mouth as needed. (Patient not taking: Reported on 04/13/2018), Disp: 4 tablet, Rfl: 3 Allergies  Allergen Reactions  . Lisinopril Cough      Social History   Socioeconomic History  .  Marital status: Single    Spouse name: Not on file  . Number of children: 2  . Years of education: Not on file  . Highest education level: Not on file  Occupational History  . Occupation: N/A    Employer: NOT EMPLOYED  Social Needs  . Financial resource strain: Not on file  . Food insecurity    Worry: Not on file    Inability: Not on file  . Transportation needs    Medical: Not on file    Non-medical: Not on file  Tobacco Use  . Smoking status: Former Smoker    Packs/day: 1.50    Years: 3.00    Pack years: 4.50    Types: Cigarettes    Quit date: 10/31/2013    Years since quitting: 4.9  . Smokeless tobacco: Never Used  Substance and Sexual Activity  . Alcohol use: No    Alcohol/week: 1.0 standard drinks    Types: 1 Cans of beer per week  . Drug use: No    Types: Cocaine    Comment: 10/31/2013 "might use cocaine once/month" - Has not used since 2015.   Marland Kitchen Sexual activity: Never  Lifestyle  . Physical activity    Days per week: Not on file    Minutes per session: Not on file  . Stress: Not on file  Relationships  . Social connections  Talks on phone: Not on file    Gets together: Not on file    Attends religious service: Not on file    Active member of club or organization: Not on file    Attends meetings of clubs or organizations: Not on file    Relationship status: Not on file  . Intimate partner violence    Fear of current or ex partner: Not on file    Emotionally abused: Not on file    Physically abused: Not on file    Forced sexual activity: Not on file  Other Topics Concern  . Not on file  Social History Narrative  . Not on file    Physical Exam Cardiovascular:     Rate and Rhythm: Normal rate and regular rhythm.     Pulses: Normal pulses.  Pulmonary:     Effort: Pulmonary effort is normal.     Breath sounds: Normal breath sounds.  Musculoskeletal: Normal range of motion.     Right lower leg: Edema present.     Left lower leg: Edema present.   Skin:    General: Skin is warm and dry.     Capillary Refill: Capillary refill takes less than 2 seconds.  Neurological:     Mental Status: He is alert and oriented to person, place, and time.  Psychiatric:        Mood and Affect: Mood normal.         Future Appointments  Date Time Provider [place]  10/24/2018  2:15 PM [facility]  10/25/2018  2:00 PM [facility] PA/NP [facility] None  11/16/2018 10:00 AM Sherren Mocha, MD [facility]  11/23/2018  2:40 PM Larey Dresser, MD [facility] None    BP 138/90 (BP Location: Left Arm, Patient Position: Sitting, Cuff Size: Normal)   Pulse 95   Resp 18   SpO2 93%   Weight yesterday- did not weigh Weight at discharge- 265 lb  Douglas [name] was seen at home today and reported feeling well. He denied chest pain SOB, headache, dizziness, orthopnea, fever or cough since being discharged from the hospital earlier this week. He reported being compliant with his medications but he has been swelling in his legs. He has been unable to weight since he has been staying with his mother since discharge and she does not have a scale. I asked if he needed a new scale but he stated he would be able to get the old one from his apartment today and would begin obtaining daily weights. I contacted the HF clinic to get guidance for his edema and per Dr Aundra Dubin he is to increase torsemide to 80 mg in the morning and 40 mg in the evening and he should begin taking 20 mEq of potassium as well. I relayed this to Douglas [name] and he was understanding and agreeable. I will follow up next week at his appointment on Thursday.   [name], EMT 10/19/18  ACTION: Home visit completed Next visit planned for 1 week

## 2018-10-23 ENCOUNTER — Telehealth (HOSPITAL_COMMUNITY): Payer: Self-pay

## 2018-10-23 NOTE — Telephone Encounter (Signed)
I called Mr Derricks after seeing I had missed his call earlier in the day. He stated he was calling because the replacement cylinders for his oxygen concentrator were not delivered last week. I advised I would call tomorrow to see why they were not delivered. I also took this opportunity to remind him of his appointment on Thursday at 14:00. He stated he was aware and would be there. I will follow up there.

## 2018-10-24 ENCOUNTER — Ambulatory Visit: Payer: Medicaid Other

## 2018-10-25 ENCOUNTER — Encounter (HOSPITAL_COMMUNITY): Payer: Self-pay

## 2018-10-25 ENCOUNTER — Other Ambulatory Visit (HOSPITAL_COMMUNITY): Payer: Self-pay

## 2018-10-25 ENCOUNTER — Other Ambulatory Visit: Payer: Self-pay

## 2018-10-25 ENCOUNTER — Ambulatory Visit (HOSPITAL_COMMUNITY)
Admission: RE | Admit: 2018-10-25 | Discharge: 2018-10-25 | Disposition: A | Discharge status: Home / Self Care | Payer: Medicaid Other | Source: Ambulatory Visit | Attending: Adult Health | Admitting: Adult Health

## 2018-10-25 VITALS — BP 110/78 | HR 87 | Wt 255.8 lb

## 2018-10-25 DIAGNOSIS — Z79899 Other long term (current) drug therapy: Secondary | ICD-10-CM | POA: Insufficient documentation

## 2018-10-25 DIAGNOSIS — Z9981 Dependence on supplemental oxygen: Secondary | ICD-10-CM | POA: Diagnosis not present

## 2018-10-25 DIAGNOSIS — Z7901 Long term (current) use of anticoagulants: Secondary | ICD-10-CM | POA: Insufficient documentation

## 2018-10-25 DIAGNOSIS — E7800 Pure hypercholesterolemia, unspecified: Secondary | ICD-10-CM | POA: Insufficient documentation

## 2018-10-25 DIAGNOSIS — I110 Hypertensive heart disease with heart failure: Secondary | ICD-10-CM | POA: Insufficient documentation

## 2018-10-25 DIAGNOSIS — E875 Hyperkalemia: Secondary | ICD-10-CM | POA: Insufficient documentation

## 2018-10-25 DIAGNOSIS — Z8249 Family history of ischemic heart disease and other diseases of the circulatory system: Secondary | ICD-10-CM | POA: Insufficient documentation

## 2018-10-25 DIAGNOSIS — I5032 Chronic diastolic (congestive) heart failure: Secondary | ICD-10-CM | POA: Diagnosis not present

## 2018-10-25 DIAGNOSIS — Q211 Atrial septal defect: Secondary | ICD-10-CM | POA: Insufficient documentation

## 2018-10-25 DIAGNOSIS — Z87891 Personal history of nicotine dependence: Secondary | ICD-10-CM | POA: Diagnosis not present

## 2018-10-25 DIAGNOSIS — M1990 Unspecified osteoarthritis, unspecified site: Secondary | ICD-10-CM | POA: Diagnosis not present

## 2018-10-25 DIAGNOSIS — G4733 Obstructive sleep apnea (adult) (pediatric): Secondary | ICD-10-CM | POA: Diagnosis not present

## 2018-10-25 DIAGNOSIS — Z8673 Personal history of transient ischemic attack (TIA), and cerebral infarction without residual deficits: Secondary | ICD-10-CM | POA: Insufficient documentation

## 2018-10-25 DIAGNOSIS — Q2112 Patent foramen ovale: Secondary | ICD-10-CM

## 2018-10-25 LAB — BASIC METABOLIC PANEL
Anion gap: 12 (ref 5–15)
BUN: 17 mg/dL (ref 6–20)
CO2: 35 mmol/L — ABNORMAL HIGH (ref 22–32)
Calcium: 9 mg/dL (ref 8.9–10.3)
Chloride: 92 mmol/L — ABNORMAL LOW (ref 98–111)
Creatinine, Ser: 1.44 mg/dL — ABNORMAL HIGH (ref 0.61–1.24)
GFR calc Af Amer: >60 mL/min (ref >60)
GFR calc non Af Amer: >60 mL/min (ref >60)
Glucose, Bld: 110 mg/dL — ABNORMAL HIGH (ref 70–99)
Potassium: 3.9 mmol/L (ref 3.5–5.1)
Sodium: 139 mmol/L (ref 135–145)

## 2018-10-25 NOTE — Patient Instructions (Signed)
It was great to see you today! No medication changes are needed at this time.   Labs today We will only contact you if something comes back abnormal or we need to make some changes. Otherwise no news is good news!  Your provider has recommended that you have a home sleep study.  [employer] is the company that does these test.  They will contact you by phone and must speak with you before they can ship the equipment.  Once they have spoken with you they will send the equipment right to your home with instructions on how to set it up.  Once you have completed the test simply box all the equipment back up and mail back to the company.  IF you have any questions or issues with the equipment please call the company directly at [phone].  If your test is positive for sleep apnea and you need a home CPAP machine you will be contacted by Dr Theodosia Blender office [facility]) to set this up.  Your physician recommends that you schedule a follow-up appointment in: 3-4 weeks  Do the following things EVERYDAY: 1) Weigh yourself in the morning before breakfast. Write it down and keep it in a log. 2) Take your medicines as prescribed 3) Eat low salt foods-Limit salt (sodium) to 2000 mg per day.  4) Stay as active as you can everyday 5) Limit all fluids for the day to less than 2 liters   At the [facility], you and your health needs are our priority. As part of our continuing mission to provide you with exceptional heart care, we have created designated Provider Care Teams. These Care Teams include your primary Cardiologist (physician) and Advanced Practice Providers (APPs- Physician Assistants and Nurse Practitioners) who all work together to provide you with the care you need, when you need it.   You may see any of the following providers on your designated Care Team at your next follow up: Marland Kitchen Dr Glori Bickers . Dr [name] . Darrick Grinder, NP   Please be sure to bring in all  your medications bottles to every appointment.

## 2018-10-25 NOTE — Progress Notes (Signed)
Patient ID: Douglas Madura., male   DOB: [birth year], 33 y.o.   MRN: [id number]    Advanced Heart Failure Clinic Note   PCP: none.  Primary Cardiologist: Dr Aundra Dubin  Pulmonary: Dr Halford Chessman    HPI: Douglas [name] is a 33 yo with OHS/OSA, morbid obesity, HTN, diastolic CHF with prominent RV failure.   Admitted in August 2016  to [facility] with volume overload. Diuresed with lasix drip and diamox. Transitioned to lasix 80 mg twice a day. Discharge weight 257 pounds.   Admit to [facility] 3/21 through 04/28/15 with respiratory distress. Required short term intubation. Apparently had not been wearing BiPap. Discharge weight was 259 pounds.   Earlier this week he was seen in the ED with increased shortness of breath. Apparently he had run out of his oxygen. He was evaluated and discharged home. K was 5.9.   Admitted with AMS in the setting of acute stroke. Suspect that he had a cardio-embolic CVA. Slow flow, swirling with spontaneous contrast noted in the severely dilated RA. There was a moderate-sized PFO with bidirectional shunting. Strongly suspect this was the source of his CVA. Atrial fibrillation has not been seen, no LA appendage thrombus. TEE performed and showed PFO.  Discharged on eliquis. Plan to follow up with Dr Burt Knack.   Today he returns for post hospital HF follow up.Overall feeling much better. Having a little difficulty walking up steps. Says he is not SOB as long as he has his oxygen.  Denies SOB/PND/Orthopnea. Using 2 liters. CPAP was removed because he had not been using it. Appetite ok. No fever or chills. Weight at home 250 pounds. Taking all medications. HF Paramedicine following and helping him with his medications.   - ECHO 03/20/2014 EF 60-65% - ECHO 8/18: EF 55-60%, grade II diastolic dydsfunction, D-shaped septum suggesting RV pressure/volume overload, severe RV dilation with mild-moderately decreased RV systolic function, unable to estimate PA pressure.  - ECHO 5/20: EF 55-60%, moderate  RV dilation with mildly decreased systolic function, unable to estimate PASP, D-shaped interventricular septum.   Review of systems complete and found to be negative unless listed in HPI.   SH:  Social History   Socioeconomic History  . Marital status: Single    Spouse name: Not on file  . Number of children: 2  . Years of education: Not on file  . Highest education level: Not on file  Occupational History  . Occupation: N/A    Employer: NOT EMPLOYED  Social Needs  . Financial resource strain: Not on file  . Food insecurity    Worry: Not on file    Inability: Not on file  . Transportation needs    Medical: Not on file    Non-medical: Not on file  Tobacco Use  . Smoking status: Former Smoker    Packs/day: 1.50    Years: 3.00    Pack years: 4.50    Types: Cigarettes    Quit date: 10/31/2013    Years since quitting: 4.9  . Smokeless tobacco: Never Used  Substance and Sexual Activity  . Alcohol use: No    Alcohol/week: 1.0 standard drinks    Types: 1 Cans of beer per week  . Drug use: No    Types: Cocaine    Comment: 10/31/2013 "might use cocaine once/month" - Has not used since 2015.   Marland Kitchen Sexual activity: Never  Lifestyle  . Physical activity    Days per week: Not on file    Minutes per session: Not  on file  . Stress: Not on file  Relationships  . Social Herbalist on phone: Not on file    Gets together: Not on file    Attends religious service: Not on file    Active member of club or organization: Not on file    Attends meetings of clubs or organizations: Not on file    Relationship status: Not on file  . Intimate partner violence    Fear of current or ex partner: Not on file    Emotionally abused: Not on file    Physically abused: Not on file    Forced sexual activity: Not on file  Other Topics Concern  . Not on file  Social History Narrative  . Not on file    FH:  Family History  Problem Relation Age of Onset  . Heart disease Mother         Pacemaker  . Hypertension Father   . Cancer Paternal Grandfather   . Heart attack Neg Hx   . Stroke Neg Hx     Past Medical History:  Diagnosis Date  . Arthritis    "hands, feet" (10/31/2013)  . CHF (congestive heart failure) ([place])   . Chronic bronchitis ([place])   . High cholesterol   . Hypertension   . Obesity   . OSA on CPAP   . Pneumonia 09/2011  . Substance abuse ([place]) 8/31/213   cociane "first time use"  . Tobacco abuse     Current Outpatient Medications  Medication Sig Dispense Refill  . apixaban (ELIQUIS) 5 MG TABS tablet Take 1 tablet (5 mg total) by mouth 2 (two) times daily. 60 tablet 3  . atorvastatin (LIPITOR) 80 MG tablet Take 1 tablet (80 mg total) by mouth daily at 6 PM. 30 tablet 11  . carvedilol (COREG) 6.25 MG tablet TAKE 1 AND 1/2 TABLET BY MOUTH TWICE A DAY (Patient taking differently: Take 9.375 mg by mouth 2 (two) times daily with a meal. ) 90 tablet 5  . hydrALAZINE (APRESOLINE) 25 MG tablet Take 1 tablet (25 mg total) by mouth 3 (three) times daily. 270 tablet 1  . metolazone (ZAROXOLYN) 2.5 MG tablet Take 1 tablet (2.5 mg total) by mouth as needed. 4 tablet 3  . OXYGEN Inhale 2 L/min into the lungs continuous.    . potassium chloride SA (K-DUR) 20 MEQ tablet Take 1 tablet (20 mEq total) by mouth daily. 30 tablet 3  . PROVENTIL HFA 108 (90 Base) MCG/ACT inhaler INHALE 1 TO 2 PUFFS BY MOUTH EVERY 6 (SIX) HOURS AS NEEDED FOR WHEEZING OR SHORTNESS OF BREATH (Patient taking differently: Inhale 1-2 puffs into the lungs every 6 (six) hours as needed for wheezing. ) 6.7 g 0  . torsemide (DEMADEX) 20 MG tablet Take 4 tablets (80 mg total) by mouth daily. 120 tablet 3   No current facility-administered medications for this encounter.     Vitals:   10/25/18 1417  BP: 110/78  Pulse: 87  SpO2: 90%  Weight: 116 kg (255 lb 12.8 oz)   Wt Readings from Last 3 Encounters:  10/25/18 116 kg (255 lb 12.8 oz)  10/17/18 121.3 kg (267 lb 6.7 oz)  10/11/18 122.8 kg (270 lb  12.8 oz)    Vitals:   10/25/18 1417  BP: 110/78  Pulse: 87  SpO2: 90%    PHYSICAL EXAM: General:  Well appearing. No resp difficulty HEENT: normal Neck: supple. no JVD. Carotids 2+ bilat; no bruits. No lymphadenopathy  or thryomegaly appreciated. Cor: PMI nondisplaced. Regular rate & rhythm. No rubs, gallops or murmurs. Lungs: clear on 2 liters [place].  Abdomen: obese, soft, nontender, nondistended. No hepatosplenomegaly. No bruits or masses. Good bowel sounds. Extremities: no cyanosis, clubbing, rash, edema Neuro: alert & orientedx3, cranial nerves grossly intact. moves all 4 extremities w/o difficulty. Affect pleasant   ASSESSMENT & PLAN:  1.Chronic diastolic CHF with prominent RV failure: Echo was done today and reviewed.  EF 55-60% with moderately dilated/mildly dysfunctional RV and D-shaped interventricular septum. RV failure is in the setting of OSA and OHS.   - NYHA II-III. Volume status stable. Continue  torsemide 80 mg daily.  - Keep off losartan.  - Continue Coreg 9.375 mg BID 2. OHS/OSA:  CPAP was removed because he stopped using it. Needs another sleep study. Today he was set up for home sleep study.    3. HTN:    Stable.  4. Hyperkalemia  Resolved.  5. Stroke- L MCA stroke secondary to PFO paradoxical emboli: PFO with bidirectional flow was found on TEE, suggesting stroke was related to paradoxical emboli.  DVT studies negative. - Eliquis 5 mg twice daily - Outpt Rehab contacting him for follow up.  6. PFO  - Follow-up appointment with Dr. Burt Knack for PFO closure on 11/16/2018  Follow up with Dr Aundra Dubin the end of the month. Continue HF Paramedicine.      Darrick Grinder, NP 10/25/2018

## 2018-10-25 NOTE — Progress Notes (Signed)
Paramedicine Encounter   Patient ID: Douglas [name] , male,   DOB: 09/03/1985,33 y.o.,  MRN: [id number]   Douglas [name] was seen in the clinic today with Douglas Grinder, NP. He reported feeling well and did not have any complaints. Per [name], no medication changes were necessary. I informed him that I had spoken with [employer] about the shipment of columns for his oxygen concentrator and they advised it was shipped on September 28 and should have arrived already. They were unclear on which address it was sent to and are looking into the issue at present. His concentrator is currently working without issue. I will follow up in two weeks when I return from vacation. I advised Douglas Trevorrow to contact the HF clinic if he has any issues next week while I am gone.   [name], EMT 10/25/2018   ACTION: Next visit planned for 2 weeks

## 2018-10-25 NOTE — Progress Notes (Signed)
Patient Name:         DOB:       Height:     Weight:  Office Name:         Referring Provider:  Today's Date:  Date:   STOP BANG RISK ASSESSMENT S (snore) Have you been told that you snore?     YES   T (tired) Are you often tired, fatigued, or sleepy during the day?   NO  O (obstruction) Do you stop breathing, choke, or gasp during sleep? NO   P (pressure) Do you have or are you being treated for high blood pressure? YES  B (BMI) Is your body index greater than 35 kg/m? YES   A (age) Are you 46 years old or older? NO   N (neck) Do you have a neck circumference greater than 16 inches?   NO   G (gender) Are you a male? YES   TOTAL STOP/BANG "YES" ANSWERS                                                                        For Office Use Only              Procedure Order Form    YES to 3+ Stop Bang questions OR two clinical symptoms - patient qualifies for WatchPAT (CPT 95800)     Submit: This Form + Patient Face Sheet + Clinical Note via Cloud[employer] or Fax: [phone]         Clinical Notes: Will consult Sleep Specialist and refer for management of therapy due to patient increased risk of Sleep Apnea. Ordering a sleep study due to the following two clinical symptoms: Excessive daytime sleepiness G47.10 / Gastroesophageal reflux K21.9 / Nocturia R35.1 / Morning Headaches G44.221 / Difficulty concentrating R41.840 / Memory problems or poor judgment G31.84 / Personality changes or irritability R45.4 / Loud snoring R06.83 / Depression F32.9 / Unrefreshed by sleep G47.8 / Impotence N52.9 / History of high blood pressure R03.0 / Insomnia G47.00    I understand that I am proceeding with a home sleep apnea test as ordered by my treating physician. I understand that untreated sleep apnea is a serious cardiovascular risk factor and it is my responsibility to perform the test and seek management for sleep apnea. I will be contacted with the results and be managed for sleep apnea by a  local sleep physician. I will be receiving equipment and further instructions from [facility]. I shall promptly ship back the equipment via the included mailing label. I understand my insurance will be billed for the test and as the patient I am responsible for any insurance related out-of-pocket costs incurred. I have been provided with written instructions and can call for additional video or telephonic instruction, with 24-hour availability of qualified personnel to answer any questions: Patient Help Desk [phone].  Patient Signature ______________________________________________________   Date______________________ Patient Telemedicine Verbal Consent

## 2018-10-29 ENCOUNTER — Other Ambulatory Visit (HOSPITAL_COMMUNITY): Payer: Self-pay | Admitting: Cardiology

## 2018-11-06 ENCOUNTER — Telehealth (HOSPITAL_COMMUNITY): Payer: Self-pay | Admitting: Adult Health

## 2018-11-06 ENCOUNTER — Telehealth (HOSPITAL_COMMUNITY): Payer: Self-pay

## 2018-11-06 MED ORDER — TORSEMIDE 20 MG PO TABS: ORAL_TABLET | ORAL | 11 refills | Status: DC

## 2018-11-06 NOTE — Telephone Encounter (Signed)
Douglas Douglas [name] called me to advise he was out of torsemide and they pharmacy was saying it was too early for him to obtain a refill. I checked his chart and noted that his prescription had not been update in his chart since being change at the end of September. I contacted [name] Vlegg, NP, and she stated she would make the change and send in a new prescription. I relayed this information to Douglas [name] and he was understanding. He should be able to get his medication by this evening.

## 2018-11-06 NOTE — Telephone Encounter (Signed)
   Received call from Yevonne Aline HF Paramedic. Needs refill torsemide.   Torsemide was recently changed the end of September 80 mg/40 mg .    Refill sent in to [facility] and I update torsemide prescription.    Alvin Diffee NP- C 2:00 PM

## 2018-11-07 ENCOUNTER — Telehealth (HOSPITAL_COMMUNITY): Payer: Self-pay

## 2018-11-08 ENCOUNTER — Telehealth (HOSPITAL_COMMUNITY): Payer: Self-pay

## 2018-11-08 NOTE — Telephone Encounter (Signed)
I called Douglas [name] to schedule an appointment. He stated he would be available tomorrow afternoon so we agreed to meet at 14:00.

## 2018-11-08 NOTE — Telephone Encounter (Signed)
Douglas [name] called me from his sisters phone to advise that he had lost his phone and was at the store trying to get a new one. As a result he would not be able to keep our appointment for today at 14:00. I asked when he would be able to meet and he advised he wasn't sure when he would be available but he would call me when he gets his new phone.

## 2018-11-14 ENCOUNTER — Encounter: Payer: Self-pay | Admitting: Adult Health

## 2018-11-14 ENCOUNTER — Inpatient Hospital Stay: Payer: Medicaid Other | Admitting: Adult Health

## 2018-11-15 ENCOUNTER — Other Ambulatory Visit (HOSPITAL_COMMUNITY): Payer: Self-pay

## 2018-11-15 NOTE — Progress Notes (Signed)
Paramedicine Encounter    Patient ID: Douglas [name]., male    DOB: 12-11-1985, 33 y.o.   MRN: [id number]   Patient Care Team: Medicine, [facility] as PCP - General  Patient Active Problem List   Diagnosis Date Noted  . CVA (cerebral vascular accident) ([place]) 10/14/2018  . Morbid obesity ([place]) 06/17/2015  . Essential hypertension 04/14/2015  . Hyperkalemia 04/14/2015  . OSA (obstructive sleep apnea)   . Difficult intravenous access   . Chronic diastolic CHF (congestive heart failure), NYHA class 3 ([place]) 09/13/2014  . Cor pulmonale, chronic ([place]) 03/19/2014  . OSA treated with BiPAP     Current Outpatient Medications:  .  apixaban (ELIQUIS) 5 MG TABS tablet, Take 1 tablet (5 mg total) by mouth 2 (two) times daily., Disp: 60 tablet, Rfl: 3 .  atorvastatin (LIPITOR) 80 MG tablet, Take 1 tablet (80 mg total) by mouth daily at 6 PM., Disp: 30 tablet, Rfl: 11 .  carvedilol (COREG) 6.25 MG tablet, TAKE 1 AND 1/2 TABLET BY MOUTH TWICE A DAY, Disp: 90 tablet, Rfl: 5 .  hydrALAZINE (APRESOLINE) 25 MG tablet, Take 1 tablet (25 mg total) by mouth 3 (three) times daily., Disp: 270 tablet, Rfl: 1 .  OXYGEN, Inhale 2 L/min into the lungs continuous., Disp: , Rfl:  .  potassium chloride SA (K-DUR) 20 MEQ tablet, Take 1 tablet (20 mEq total) by mouth daily., Disp: 30 tablet, Rfl: 3 .  PROVENTIL HFA 108 (90 Base) MCG/ACT inhaler, INHALE 1 TO 2 PUFFS BY MOUTH EVERY 6 (SIX) HOURS AS NEEDED FOR WHEEZING OR SHORTNESS OF BREATH (Patient taking differently: Inhale 1-2 puffs into the lungs every 6 (six) hours as needed for wheezing. ), Disp: 6.7 g, Rfl: 0 .  torsemide (DEMADEX) 20 MG tablet, Take 80 mg in am and 40 mg in pm, Disp: 180 tablet, Rfl: 11 .  metolazone (ZAROXOLYN) 2.5 MG tablet, Take 1 tablet (2.5 mg total) by mouth as needed. (Patient not taking: Reported on 11/15/2018), Disp: 4 tablet, Rfl: 3 Allergies  Allergen Reactions  . Lisinopril Cough      Social History    Socioeconomic History  . Marital status: Single    Spouse name: Not on file  . Number of children: 2  . Years of education: Not on file  . Highest education level: Not on file  Occupational History  . Occupation: N/A    Employer: NOT EMPLOYED  Social Needs  . Financial resource strain: Not on file  . Food insecurity    Worry: Not on file    Inability: Not on file  . Transportation needs    Medical: Not on file    Non-medical: Not on file  Tobacco Use  . Smoking status: Former Smoker    Packs/day: 1.50    Years: 3.00    Pack years: 4.50    Types: Cigarettes    Quit date: 10/31/2013    Years since quitting: 5.0  . Smokeless tobacco: Never Used  Substance and Sexual Activity  . Alcohol use: No    Alcohol/week: 1.0 standard drinks    Types: 1 Cans of beer per week  . Drug use: No    Types: Cocaine    Comment: 10/31/2013 "might use cocaine once/month" - Has not used since 2015.   Marland Kitchen Sexual activity: Never  Lifestyle  . Physical activity    Days per week: Not on file    Minutes per session: Not on file  . Stress: Not  on file  Relationships  . Social Herbalist on phone: Not on file    Gets together: Not on file    Attends religious service: Not on file    Active member of club or organization: Not on file    Attends meetings of clubs or organizations: Not on file    Relationship status: Not on file  . Intimate partner violence    Fear of current or ex partner: Not on file    Emotionally abused: Not on file    Physically abused: Not on file    Forced sexual activity: Not on file  Other Topics Concern  . Not on file  Social History Narrative  . Not on file    Physical Exam Cardiovascular:     Rate and Rhythm: Normal rate and regular rhythm.     Pulses: Normal pulses.  Pulmonary:     Effort: Pulmonary effort is normal.     Breath sounds: Normal breath sounds.  Abdominal:     General: There is no distension.  Musculoskeletal: Normal range of motion.      Right lower leg: No edema.     Left lower leg: No edema.  Skin:    General: Skin is warm and dry.     Capillary Refill: Capillary refill takes less than 2 seconds.  Neurological:     Mental Status: He is alert and oriented to person, place, and time.  Psychiatric:        Mood and Affect: Mood normal.         Future Appointments  Date Time Provider [place]  11/16/2018 10:00 AM Sherren Mocha, MD [facility]  11/23/2018  2:40 PM Larey Dresser, MD [facility] None  11/27/2018 12:30 PM Rine, Selmer Dominion, OT [facility]  11/27/2018  1:15 PM Electa Sniff, PT [facility]  12/17/2018  3:15 PM McCue, Janett Billow, NP [facility] None    BP 122/90 (BP Location: Left Arm, Patient Position: Sitting, Cuff Size: Normal)   Pulse 78   Resp 16   Wt 249 lb (112.9 kg)   SpO2 94%   BMI 44.11 kg/m   Weight yesterday- 250 lb Last visit weight- 255 lb  [name] was seen at home today and reported feeling well. He denied chest pain, SOB, headache, dizziness, orthopnea, fever or cough since our last visit. He stated he has been compliant with his medications over the past week and his weigt has been trending down. His medications were verified and all necessary refills were ordered. I will follow up next week at his clinic appointment.   Douglas [name], EMT 11/15/18  ACTION: Home visit completed Next visit planned for 1 week

## 2018-11-16 ENCOUNTER — Institutional Professional Consult (permissible substitution): Payer: Medicaid Other | Admitting: Cardiovascular Disease

## 2018-11-16 ENCOUNTER — Telehealth: Payer: Self-pay

## 2018-11-16 NOTE — Telephone Encounter (Signed)
The patient cancelled his appointment this morning due to transportation issues.  Offered the patient for Monday, 10/26 and he confirmed appointment.  Reiterated the patient must wear a mask and at this time no visitors are allowed in the office to ensure social distancing due to increasing [place] numbers.  He understands Dr. Burt Knack will call his sister and have her on speaker phone during the visit if requested. He was grateful for assistance.

## 2018-11-19 ENCOUNTER — Encounter: Payer: Self-pay | Admitting: Cardiovascular Disease

## 2018-11-19 ENCOUNTER — Ambulatory Visit (INDEPENDENT_AMBULATORY_CARE_PROVIDER_SITE_OTHER): Payer: Medicaid Other | Admitting: Cardiovascular Disease

## 2018-11-19 ENCOUNTER — Other Ambulatory Visit: Payer: Self-pay

## 2018-11-19 VITALS — BP 118/76 | HR 76 | Ht 65.0 in | Wt 248.0 lb

## 2018-11-19 DIAGNOSIS — Q211 Atrial septal defect: Secondary | ICD-10-CM | POA: Diagnosis not present

## 2018-11-19 DIAGNOSIS — Q2112 Patent foramen ovale: Secondary | ICD-10-CM

## 2018-11-19 NOTE — Progress Notes (Signed)
Cardiology Office Note:    Date:  11/19/2018   ID:  Douglas Madura., DOB 13-Oct-1985, MRN [id number]  PCP:  Medicine, [facility]  Cardiologist:  No primary care provider on file.  Electrophysiologist:  None   Referring MD: [facility]   Chief Complaint  Patient presents with  . Shortness of Breath    History of Present Illness:    Douglas Lugg. is a 33 y.o. male with a hx of chronic right heart failure, followed by the Dr Aundra Dubin and Darrick Grinder in the [facility].   He was seen during a recent hospitalization on 10/16/2018 in consultation for PFO management.  He presented with slurred speech, right facial droop, and right-sided hemiparesis.  CT and MRI studies demonstrated evidence of an acute left MCA infarct, a chronic left PICA and right parietal-occipital infarcts.  He was not a candidate for TPA or acute stroke intervention because of symptom duration and size of infarcted area.  His stroke work-up demonstrated normal bilateral venous duplex scan, no significant carotid stenosis, and evidence of RV dilatation/dysfunction with a moderate PFO and spontaneous contrast in the right atrium.  He was started on apixaban and returns today for further discussion regarding transcatheter PFO closure in an attempt to reduce his risk of recurrent stroke.   Past Medical History:  Diagnosis Date  . Arthritis    "hands, feet" (10/31/2013)  . CHF (congestive heart failure) ([place])   . Chronic bronchitis ([place])   . High cholesterol   . Hypertension   . Obesity   . OSA on CPAP   . Pneumonia 09/2011  . Substance abuse ([place]) 8/31/213   cociane "first time use"  . Tobacco abuse     Past Surgical History:  Procedure Laterality Date  . BUBBLE STUDY  10/15/2018   Procedure: BUBBLE STUDY;  Surgeon: Larey Dresser, MD;  Location: [facility] ENDOSCOPY;  Service: Cardiovascular;;  . NO PAST SURGERIES    . TEE WITHOUT CARDIOVERSION N/A 10/15/2018   Procedure:  TRANSESOPHAGEAL ECHOCARDIOGRAM (TEE);  Surgeon: Larey Dresser, MD;  Location: [facility] ENDOSCOPY;  Service: Cardiovascular;  Laterality: N/A;  . TUBES IN EARS      Current Medications: Current Meds  Medication Sig  . atorvastatin (LIPITOR) 80 MG tablet Take 1 tablet (80 mg total) by mouth daily at 6 PM.  . carvedilol (COREG) 6.25 MG tablet TAKE 1 AND 1/2 TABLET BY MOUTH TWICE A DAY  . hydrALAZINE (APRESOLINE) 25 MG tablet Take 1 tablet (25 mg total) by mouth 3 (three) times daily.  . OXYGEN Inhale 2 L/min into the lungs continuous.  . potassium chloride SA (K-DUR) 20 MEQ tablet Take 1 tablet (20 mEq total) by mouth daily.  Marland Kitchen PROVENTIL HFA 108 (90 Base) MCG/ACT inhaler INHALE 1 TO 2 PUFFS BY MOUTH EVERY 6 (SIX) HOURS AS NEEDED FOR WHEEZING OR SHORTNESS OF BREATH (Patient taking differently: Inhale 1-2 puffs into the lungs every 6 (six) hours as needed for wheezing. )  . torsemide (DEMADEX) 20 MG tablet Take 80 mg in am and 40 mg in pm     Allergies:   Lisinopril   Social History   Socioeconomic History  . Marital status: Single    Spouse name: Not on file  . Number of children: 2  . Years of education: Not on file  . Highest education level: Not on file  Occupational History  . Occupation: N/A    Employer: NOT EMPLOYED  Social  Needs  . Financial resource strain: Not on file  . Food insecurity    Worry: Not on file    Inability: Not on file  . Transportation needs    Medical: Not on file    Non-medical: Not on file  Tobacco Use  . Smoking status: Former Smoker    Packs/day: 1.50    Years: 3.00    Pack years: 4.50    Types: Cigarettes    Quit date: 10/31/2013    Years since quitting: 5.0  . Smokeless tobacco: Never Used  Substance and Sexual Activity  . Alcohol use: No    Alcohol/week: 1.0 standard drinks    Types: 1 Cans of beer per week  . Drug use: No    Types: Cocaine    Comment: 10/31/2013 "might use cocaine once/month" - Has not used since 2015.   Marland Kitchen Sexual activity:  Never  Lifestyle  . Physical activity    Days per week: Not on file    Minutes per session: Not on file  . Stress: Not on file  Relationships  . Social Herbalist on phone: Not on file    Gets together: Not on file    Attends religious service: Not on file    Active member of club or organization: Not on file    Attends meetings of clubs or organizations: Not on file    Relationship status: Not on file  Other Topics Concern  . Not on file  Social History Narrative  . Not on file     Family History: The patient's family history includes Cancer in his paternal grandfather; Heart disease in his mother; Hypertension in his father. There is no history of Heart attack or Stroke.  ROS:   Please see the history of present illness.    Positive for fatigue and shortness of breath.  All other systems reviewed and are negative.  EKGs/Labs/Other Studies Reviewed:    The following studies were reviewed today: TEE October 30, 2018: IMPRESSIONS    1. Left ventricular ejection fraction, by visual estimation, is 55%. The left ventricle has normal function. Normal left ventricular size. There is moderately increased left ventricular hypertrophy.  2. Global right ventricle has moderately reduced systolic function.The right ventricular size is severely enlarged. Mildly increased right ventricular wall thickness. D-shaped septum suggestive of RV pressure/volume overload.  3. Trivial pericardial effusion is present.  4. The tricuspid valve is normal in structure. Tricuspid valve regurgitation is mild. Peak RV-RA gradient 38 mmHg.  5. Mild left atrial enlargement. There turbulent flow into the left atrium adjacent to the right upper pulmonary vein. The flow is continous by CW doppler but does not appear to be an ASD with right to left shunting (does not originate from the RA). It  possibly represents ?a compressed pulmonary vein. Needs further evaluation.  6. Evidence of atrial level shunting  detected by color flow Doppler. Patient has a moderate PFO with bidirectional flow. Positive bubble study.  7. Severe right atrial enlargement. There is spontaneous contrast noted in the RA suggesive of low flow state and risk of thrombosis.  8. The aortic root was not well visualized.  9. The aortic valve is tricuspid Aortic valve regurgitation was not visualized by color flow Doppler. Structurally normal aortic valve, with no evidence of sclerosis or stenosis. 10. The mitral valve is normal in structure. Trace mitral valve regurgitation. No evidence of mitral stenosis. 11. The pulmonic valve was normal in structure. Pulmonic valve regurgitation is not  visualized by color flow Doppler.  FINDINGS  Left Ventricle: Left ventricular ejection fraction, by visual estimation, is 55%. The left ventricle has normal function. There is moderately increased left ventricular hypertrophy. Normal left ventricular size.  Right Ventricle: The right ventricular size is severely enlarged. Mildly increased right ventricular wall thickness. Global RV systolic function is has moderately reduced systolic function.  Left Atrium: The left atrium was not well visualized. Left atrial size was not well visualized. Mild left atrial enlargement.  Right Atrium: Right atrial size was severely dilated  Pericardium: Trivial pericardial effusion is present.  Mitral Valve: The mitral valve is normal in structure. No evidence of mitral valve stenosis by observation. Trace mitral valve regurgitation.  Tricuspid Valve: The tricuspid valve is normal in structure. Tricuspid valve regurgitation is mild by color flow Doppler.  Aortic Valve: The aortic valve is tricuspid. Aortic valve regurgitation was not visualized by color flow Doppler. The aortic valve is structurally normal, with no evidence of sclerosis or stenosis.  Pulmonic Valve: The pulmonic valve was normal in structure. Pulmonic valve regurgitation is not  visualized by color flow Doppler.  Aorta: The aortic root was not well visualized.  Shunts: Evidence of atrial level shunting detected by color flow Doppler.   Cardiac CTA 10/16/2018: Calcium Score: 68 Agatston units.  Coronary Arteries: Right dominant with no anomalies  LM: No plaque or stenosis.  LAD system: Large tortuous vessel. Calcified plaque proximal LAD, no significant stenosis.  Circumflex system: Large tortuous vessel, no plaque or stenosis.  RCA system: Large tortuous vessel, no plaque or stenosis.  IMPRESSION: 1. Coronary artery calcium score 68 Agatston units. This places the patient in the 96th percentile for age and gender, suggesting high risk for future cardiac events.  2. A large branch of the right upper pulmonary vein was compressed between the right branch pulmonary artery and the SVC, this likely caused the turbulent flow into the LA seen on TEE.  3.  Nonobstructive coronary disease in the proximal LAD.   EKG:  EKG is ordered today.  The ekg ordered today demonstrates NSR, RVH  Recent Labs: 10/09/2018: B Natriuretic Peptide 392.2 10/14/2018: ALT 16 10/15/2018: TSH 0.419 10/17/2018: Hemoglobin 17.8; Platelets 100 10/25/2018: BUN 17; Creatinine, Ser 1.44; Potassium 3.9; Sodium 139  Recent Lipid Panel    Component Value Date/Time   CHOL 124 10/14/2018 0649   TRIG 59 10/14/2018 0649   HDL 35 (L) 10/14/2018 0649   CHOLHDL 3.5 10/14/2018 0649   VLDL 12 10/14/2018 0649   LDLCALC 77 10/14/2018 0649    Physical Exam:    VS:  BP 118/76   Pulse 76   Ht 5\' 5"  (1.651 m)   Wt 248 lb (112.5 kg)   SpO2 (!) 86%   BMI 41.27 kg/m     Wt Readings from Last 3 Encounters:  11/19/18 248 lb (112.5 kg)  11/15/18 249 lb (112.9 kg)  10/25/18 255 lb 12.8 oz (116 kg)     GEN: pleasant overweight African American male  in no acute distress, on O2 HEENT: Normal NECK: No JVD; No carotid bruits LYMPHATICS: No lymphadenopathy CARDIAC: RRR, no murmurs,  rubs, gallops RESPIRATORY:  Clear to auscultation without rales, wheezing or rhonchi  ABDOMEN: Soft, non-tender, non-distended MUSCULOSKELETAL:  No edema; No deformity  SKIN: Warm and dry NEUROLOGIC:  Alert and oriented x 3 PSYCHIATRIC:  Normal affect   ASSESSMENT:    1. PFO (patent foramen ovale)    PLAN:    In order of problems listed  above:  1. TEE images again reviewed and patient has a PFO with right-to-left shunting. His RA and RV are severely dilated and the interatrial septum bows right-to-left c/w right-sided volume and pressure overload. I agree that he is at risk of recurrent stroke and paradoxical embolism and transcatheter PFO closure is a reasonable consideration. I reviewed the risks, indications, and alternatives to transcatheter closure with the patient. Specific risks include bleeding, infection, device embolization, stroke, cardiac perforation, tamponade, arrhythmia, MI, and late device erosion. He understands these serious risks occur at low incidence of < 1%. He would like to undergo PFO closure but prefers to delay until after the Holidays. He will continue on apixaban and I reinforced the importance of adherence to oral anticoagulation with him and his sister who is conferenced into our office visit on speaker phone. Will schedule him for an office visit with Nell Range, [place] in January 2021 for close structural heart clinic follow-up.    Medication Adjustments/Labs and Tests Ordered: Current medicines are reviewed at length with the patient today.  Concerns regarding medicines are outlined above.  Orders Placed This Encounter  Procedures  . EKG 12-Lead   No orders of the defined types were placed in this encounter.   Patient Instructions     Signed, Sherren Mocha, MD  11/19/2018 10:53 AM    [place]

## 2018-11-19 NOTE — Patient Instructions (Addendum)
You are scheduled with Dr. Antionette Char assistant, Nell Range, on February 21, 2019 at 1:30PM. Please arrive by 1:15PM.

## 2018-11-23 ENCOUNTER — Encounter (HOSPITAL_COMMUNITY): Payer: Self-pay | Admitting: Cardiology

## 2018-11-23 ENCOUNTER — Ambulatory Visit (HOSPITAL_COMMUNITY)
Admission: RE | Admit: 2018-11-23 | Discharge: 2018-11-23 | Disposition: A | Discharge status: Home / Self Care | Payer: Medicaid Other | Source: Ambulatory Visit | Attending: Cardiology | Admitting: Cardiology

## 2018-11-23 ENCOUNTER — Other Ambulatory Visit: Payer: Self-pay

## 2018-11-23 VITALS — BP 117/70 | HR 77 | Wt 250.6 lb

## 2018-11-23 DIAGNOSIS — Z87891 Personal history of nicotine dependence: Secondary | ICD-10-CM | POA: Insufficient documentation

## 2018-11-23 DIAGNOSIS — I110 Hypertensive heart disease with heart failure: Secondary | ICD-10-CM | POA: Diagnosis not present

## 2018-11-23 DIAGNOSIS — Z7901 Long term (current) use of anticoagulants: Secondary | ICD-10-CM | POA: Insufficient documentation

## 2018-11-23 DIAGNOSIS — Q211 Atrial septal defect: Secondary | ICD-10-CM | POA: Insufficient documentation

## 2018-11-23 DIAGNOSIS — Q2112 Patent foramen ovale: Secondary | ICD-10-CM

## 2018-11-23 DIAGNOSIS — E7800 Pure hypercholesterolemia, unspecified: Secondary | ICD-10-CM | POA: Insufficient documentation

## 2018-11-23 DIAGNOSIS — I5032 Chronic diastolic (congestive) heart failure: Secondary | ICD-10-CM | POA: Insufficient documentation

## 2018-11-23 DIAGNOSIS — Z79899 Other long term (current) drug therapy: Secondary | ICD-10-CM | POA: Insufficient documentation

## 2018-11-23 DIAGNOSIS — I4891 Unspecified atrial fibrillation: Secondary | ICD-10-CM | POA: Diagnosis not present

## 2018-11-23 DIAGNOSIS — M1990 Unspecified osteoarthritis, unspecified site: Secondary | ICD-10-CM | POA: Insufficient documentation

## 2018-11-23 DIAGNOSIS — Z8249 Family history of ischemic heart disease and other diseases of the circulatory system: Secondary | ICD-10-CM | POA: Insufficient documentation

## 2018-11-23 DIAGNOSIS — R0683 Snoring: Secondary | ICD-10-CM | POA: Diagnosis not present

## 2018-11-23 DIAGNOSIS — R0603 Acute respiratory distress: Secondary | ICD-10-CM | POA: Diagnosis not present

## 2018-11-23 DIAGNOSIS — G4733 Obstructive sleep apnea (adult) (pediatric): Secondary | ICD-10-CM | POA: Diagnosis not present

## 2018-11-23 DIAGNOSIS — I2510 Atherosclerotic heart disease of native coronary artery without angina pectoris: Secondary | ICD-10-CM | POA: Diagnosis not present

## 2018-11-23 DIAGNOSIS — R0602 Shortness of breath: Secondary | ICD-10-CM | POA: Insufficient documentation

## 2018-11-23 DIAGNOSIS — I2781 Cor pulmonale (chronic): Secondary | ICD-10-CM

## 2018-11-23 LAB — BASIC METABOLIC PANEL
Anion gap: 10 (ref 5–15)
BUN: 21 mg/dL — ABNORMAL HIGH (ref 6–20)
CO2: 37 mmol/L — ABNORMAL HIGH (ref 22–32)
Calcium: 9.2 mg/dL (ref 8.9–10.3)
Chloride: 89 mmol/L — ABNORMAL LOW (ref 98–111)
Creatinine, Ser: 1.37 mg/dL — ABNORMAL HIGH (ref 0.61–1.24)
GFR calc Af Amer: >60 mL/min (ref >60)
GFR calc non Af Amer: >60 mL/min (ref >60)
Glucose, Bld: 81 mg/dL (ref 70–99)
Potassium: 5.1 mmol/L (ref 3.5–5.1)
Sodium: 136 mmol/L (ref 135–145)

## 2018-11-23 NOTE — Patient Instructions (Signed)
Labs done today. We will contact you only if your labs are abnormal.  No medication changes. Please continue all current medications as prescribed.  Your physician recommends that you schedule a follow-up appointment in: 6 weeks with the app clinic.  Your provider has recommended that you have a home sleep study.  [employer] is the company that does these test.  They will contact you by phone and must speak with you before they can ship the equipment.  Once they have spoken with you they will send the equipment right to your home with instructions on how to set it up.  Once you have completed the test simply box all the equipment back up and mail back to the company.  IF you have any questions or issues with the equipment please call the company directly at [phone].  If your test is positive for sleep apnea and you need a home CPAP machine you will be contacted by Dr Theodosia Blender office [facility]) to set this up.  At the [facility], you and your health needs are our priority. As part of our continuing mission to provide you with exceptional heart care, we have created designated Provider Care Teams. These Care Teams include your primary Cardiologist (physician) and Advanced Practice Providers (APPs- Physician Assistants and Nurse Practitioners) who all work together to provide you with the care you need, when you need it.   You may see any of the following providers on your designated Care Team at your next follow up: Marland Kitchen Dr Glori Bickers . Dr [name] . Darrick Grinder, NP . Lyda Jester, PA   Please be sure to bring in all your medications bottles to every appointment.

## 2018-11-23 NOTE — Progress Notes (Signed)
Height: 5'4.5    Weight:248.3lbs BMI: 41.70kg/m  Today's Date:11/23/2018  STOP BANG RISK ASSESSMENT S (snore) Have you been told that you snore?     YES   T (tired) Are you often tired, fatigued, or sleepy during the day?   NO  O (obstruction) Do you stop breathing, choke, or gasp during sleep? NO   P (pressure) Do you have or are you being treated for high blood pressure? YES   B (BMI) Is your body index greater than 35 kg/m? YES   A (age) Are you 54 years old or older? NO   N (neck) Do you have a neck circumference greater than 16 inches?   YES/NO   G (gender) Are you a male? YES   TOTAL STOP/BANG "YES" ANSWERS 4                                                                       For Office Use Only              Procedure Order Form    YES to 3+ Stop Bang questions OR two clinical symptoms - patient qualifies for WatchPAT (CPT 95800)      Clinical Notes: Will consult Sleep Specialist and refer for management of therapy due to patient increased risk of Sleep Apnea. Ordering a sleep study due to the following two clinical symptoms: Excessive daytime sleepiness G47.10 / Gastroesophageal reflux K21.9 / Nocturia R35.1 / Morning Headaches G44.221 / Difficulty concentrating R41.840 / Memory problems or poor judgment G31.84 / Personality changes or irritability R45.4 / Loud snoring R06.83 / Depression F32.9 / Unrefreshed by sleep G47.8 / Impotence N52.9 / History of high blood pressure R03.0 / Insomnia G47.00   Which test do you need:WP300  . Do you have access to a smart device containing the app stores?    If NO, then  --->WP300

## 2018-11-25 NOTE — Progress Notes (Signed)
Patient ID: [name], male   DOB: [birth year], 33 y.o.   MRN: [id number]    Advanced Heart Failure Clinic Note   PCP: none.  Primary Cardiologist: Dr Aundra Dubin  Pulmonary: Dr Halford Chessman    HPI: [name] is a 33 y.o. with OHS/OSA, morbid obesity, HTN, diastolic CHF with prominent RV failure.   Admitted in August 2016  to [facility] with volume overload. Diuresed with lasix drip and diamox. Transitioned to lasix 80 mg twice a day. Discharge weight 257 pounds.   Admit to [facility] 3/21 through 04/28/15 with respiratory distress. Required short term intubation. Apparently had not been wearing BiPap. Discharge weight was 259 pounds.   Admitted with AMS in the setting of acute stroke in 9/20. Suspect that he had a cardio-embolic CVA. On TEE,  swirling with spontaneous contrast noted in the severely dilated RA. There was a moderate-sized PFO with bidirectional shunting. Strongly suspect this was the source of his CVA. Atrial fibrillation has not been seen, no LA appendage thrombus.  Coronary CTA in 9/20 showed nonobstructive mild CAD.  Discharged on Eliquis.  He saw Dr. Burt Knack and has been set up for PFO closure.   He returns today for followup of OHS/OSA, diastolic CHF with RV failure, and recent CVA with PFO.  Right-sided weakness and dysarthria with stroke are almost totally resolved.  He is using home oxygen but does not have CPAP.  He has daytime sleepiness and fatigue and qualified for CPAP in the past.  No dyspnea walking on flat ground.  He is short of breath with stairs.  No orthopnea/PND.    ECG (10/20, personally reviewed): NSR, RVH  - ECHO 03/20/2014 EF 60-65% - ECHO 8/18: EF 55-60%, grade II diastolic dydsfunction, D-shaped septum suggesting RV pressure/volume overload, severe RV dilation with mild-moderately decreased RV systolic function, unable to estimate PA pressure.  - ECHO 5/20: EF 55-60%, moderate RV dilation with mildly decreased systolic function, unable to estimate PASP, D-shaped  interventricular septum.  - TEE 9/20: EF 55%, severely dilated RV with moderately decreased systolic function, D-shaped septum, large PFO with bidirectional shunting, severe RAE with spontaneous contrast in RA.  - Coronary CTA (9/20): Nonobstructive disease.   Review of systems complete and found to be negative unless listed in HPI.   SH:  Social History   Socioeconomic History  . Marital status: Single    Spouse name: Not on file  . Number of children: 2  . Years of education: Not on file  . Highest education level: Not on file  Occupational History  . Occupation: N/A    Employer: NOT EMPLOYED  Social Needs  . Financial resource strain: Not on file  . Food insecurity    Worry: Not on file    Inability: Not on file  . Transportation needs    Medical: Not on file    Non-medical: Not on file  Tobacco Use  . Smoking status: Former Smoker    Packs/day: 1.50    Years: 3.00    Pack years: 4.50    Types: Cigarettes    Quit date: 10/31/2013    Years since quitting: 5.0  . Smokeless tobacco: Never Used  Substance and Sexual Activity  . Alcohol use: No    Alcohol/week: 1.0 standard drinks    Types: 1 Cans of beer per week  . Drug use: No    Types: Cocaine    Comment: 10/31/2013 "might use cocaine once/month" - Has not used since 2015.   Marland Kitchen Sexual  activity: Never  Lifestyle  . Physical activity    Days per week: Not on file    Minutes per session: Not on file  . Stress: Not on file  Relationships  . Social Herbalist on phone: Not on file    Gets together: Not on file    Attends religious service: Not on file    Active member of club or organization: Not on file    Attends meetings of clubs or organizations: Not on file    Relationship status: Not on file  . Intimate partner violence    Fear of current or ex partner: Not on file    Emotionally abused: Not on file    Physically abused: Not on file    Forced sexual activity: Not on file  Other Topics Concern  .  Not on file  Social History Narrative  . Not on file    FH:  Family History  Problem Relation Age of Onset  . Heart disease Mother        Pacemaker  . Hypertension Father   . Cancer Paternal Grandfather   . Heart attack Neg Hx   . Stroke Neg Hx     Past Medical History:  Diagnosis Date  . Arthritis    "hands, feet" (10/31/2013)  . CHF (congestive heart failure) ([place])   . Chronic bronchitis ([place])   . High cholesterol   . Hypertension   . Obesity   . OSA on CPAP   . Pneumonia 09/2011  . Substance abuse ([place]) 8/31/213   cociane "first time use"  . Tobacco abuse     Current Outpatient Medications  Medication Sig Dispense Refill  . apixaban (ELIQUIS) 5 MG TABS tablet Take 1 tablet (5 mg total) by mouth 2 (two) times daily. 60 tablet 3  . atorvastatin (LIPITOR) 80 MG tablet Take 1 tablet (80 mg total) by mouth daily at 6 PM. 30 tablet 11  . carvedilol (COREG) 6.25 MG tablet TAKE 1 AND 1/2 TABLET BY MOUTH TWICE A DAY 90 tablet 5  . hydrALAZINE (APRESOLINE) 25 MG tablet Take 1 tablet (25 mg total) by mouth 3 (three) times daily. 270 tablet 1  . OXYGEN Inhale 2 L/min into the lungs continuous.    . potassium chloride SA (K-DUR) 20 MEQ tablet Take 1 tablet (20 mEq total) by mouth daily. 30 tablet 3  . PROVENTIL HFA 108 (90 Base) MCG/ACT inhaler INHALE 1 TO 2 PUFFS BY MOUTH EVERY 6 (SIX) HOURS AS NEEDED FOR WHEEZING OR SHORTNESS OF BREATH (Patient taking differently: Inhale 1-2 puffs into the lungs every 6 (six) hours as needed for wheezing. ) 6.7 g 0  . torsemide (DEMADEX) 20 MG tablet Take 80 mg in am and 40 mg in pm 180 tablet 11   No current facility-administered medications for this encounter.     Vitals:   11/23/18 1513  BP: 117/70  Pulse: 77  SpO2: 97%  Weight: 113.7 kg (250 lb 9.6 oz)   Wt Readings from Last 3 Encounters:  11/23/18 113.7 kg (250 lb 9.6 oz)  11/19/18 112.5 kg (248 lb)  11/15/18 112.9 kg (249 lb)    Vitals:   11/23/18 1513  BP: 117/70  Pulse: 77   SpO2: 97%    PHYSICAL EXAM: General: NAD, obese.  Neck: No JVD, no thyromegaly or thyroid nodule.  Lungs: Clear to auscultation bilaterally with normal respiratory effort. CV: Nondisplaced PMI.  Heart regular S1/S2, no S3/S4, no murmur.  No  peripheral edema.  No carotid bruit.  Normal pedal pulses.  Abdomen: Soft, nontender, no hepatosplenomegaly, no distention.  Skin: Intact without lesions or rashes.  Neurologic: Alert and oriented x 3.  Psych: Normal affect. Extremities: No clubbing or cyanosis.  HEENT: Normal.   ASSESSMENT & PLAN:  1.Chronic diastolic CHF with prominent RV failure: TEE in 9/20 showed EF 55%, severe RV dilation with moderately decreased systolic function, D-shaped septum.  RV failure is in the setting of OSA/OHS.  RVH on ECG.  NYHA class II symptoms, overall doing better.    - Continue torsemide 80 qam/40 qpm. BMET today.  - Continue Coreg 9.375 mg BID 2. OHS/OSA:  CPAP was removed because he stopped using it. Needs another sleep study. He is using home oxygen at all times.  - Continue home oxygen.  - I will arrange for home sleep study to be done, needs to get on CPAP.  3. HTN:  Controlled.  - Continue Coreg and hydralazine.  4. CAD: Nonobstructive on coronary CTA in 9/20.  - Continue statin.  5. Stroke: L MCA stroke secondary to PFO with paradoxical emboli. PFO with bidirectional flow was found on TEE, suggesting stroke was related to paradoxical emboli.  DVT studies negative.  Atrial fibrillation not visualized.  - Eliquis 5 mg twice daily 6. PFO: He has seen Dr. Burt Knack, plan for PFO closure.   Followup 6 wks with NP, continue paramedicine.     [name], MD 11/25/2018

## 2018-11-26 ENCOUNTER — Telehealth (HOSPITAL_COMMUNITY): Payer: Self-pay

## 2018-11-26 DIAGNOSIS — I5032 Chronic diastolic (congestive) heart failure: Secondary | ICD-10-CM

## 2018-11-26 NOTE — Telephone Encounter (Signed)
-----   Message from Larey Dresser, MD sent at 11/26/2018 12:18 AM EST ----- Stop KCl.  Repeat BMET 10 days.

## 2018-11-27 ENCOUNTER — Ambulatory Visit: Payer: Medicaid Other | Admitting: Occupational Therapy

## 2018-11-27 ENCOUNTER — Ambulatory Visit: Payer: Medicaid Other | Attending: Internal Medicine | Admitting: Physical Therapy

## 2018-11-28 ENCOUNTER — Other Ambulatory Visit (HOSPITAL_COMMUNITY): Payer: Self-pay

## 2018-11-28 NOTE — Progress Notes (Signed)
Paramedicine Encounter    Patient ID: Douglas Madura., male    DOB: Jul 03, 1985, 33 y.o.   MRN: [id number]   Patient Care Team: Medicine, [facility] as PCP - General  Patient Active Problem List   Diagnosis Date Noted  . CVA (cerebral vascular accident) ([place]) 10/14/2018  . Morbid obesity ([place]) 06/17/2015  . Essential hypertension 04/14/2015  . Hyperkalemia 04/14/2015  . OSA (obstructive sleep apnea)   . Difficult intravenous access   . Chronic diastolic CHF (congestive heart failure), NYHA class 3 ([place]) 09/13/2014  . Cor pulmonale, chronic ([place]) 03/19/2014  . OSA treated with BiPAP     Current Outpatient Medications:  .  apixaban (ELIQUIS) 5 MG TABS tablet, Take 1 tablet (5 mg total) by mouth 2 (two) times daily., Disp: 60 tablet, Rfl: 3 .  atorvastatin (LIPITOR) 80 MG tablet, Take 1 tablet (80 mg total) by mouth daily at 6 PM., Disp: 30 tablet, Rfl: 11 .  carvedilol (COREG) 6.25 MG tablet, TAKE 1 AND 1/2 TABLET BY MOUTH TWICE A DAY, Disp: 90 tablet, Rfl: 5 .  hydrALAZINE (APRESOLINE) 25 MG tablet, Take 1 tablet (25 mg total) by mouth 3 (three) times daily., Disp: 270 tablet, Rfl: 1 .  OXYGEN, Inhale 2 L/min into the lungs continuous., Disp: , Rfl:  .  PROVENTIL HFA 108 (90 Base) MCG/ACT inhaler, INHALE 1 TO 2 PUFFS BY MOUTH EVERY 6 (SIX) HOURS AS NEEDED FOR WHEEZING OR SHORTNESS OF BREATH (Patient taking differently: Inhale 1-2 puffs into the lungs every 6 (six) hours as needed for wheezing. ), Disp: 6.7 g, Rfl: 0 .  torsemide (DEMADEX) 20 MG tablet, Take 80 mg in am and 40 mg in pm, Disp: 180 tablet, Rfl: 11 .  potassium chloride SA (K-DUR) 20 MEQ tablet, Take 1 tablet (20 mEq total) by mouth daily. (Patient not taking: Reported on 11/28/2018), Disp: 30 tablet, Rfl: 3 Allergies  Allergen Reactions  . Lisinopril Cough      Social History   Socioeconomic History  . Marital status: Single    Spouse name: Not on file  . Number of children: 2  . Years of  education: Not on file  . Highest education level: Not on file  Occupational History  . Occupation: N/A    Employer: NOT EMPLOYED  Social Needs  . Financial resource strain: Not on file  . Food insecurity    Worry: Not on file    Inability: Not on file  . Transportation needs    Medical: Not on file    Non-medical: Not on file  Tobacco Use  . Smoking status: Former Smoker    Packs/day: 1.50    Years: 3.00    Pack years: 4.50    Types: Cigarettes    Quit date: 10/31/2013    Years since quitting: 5.0  . Smokeless tobacco: Never Used  Substance and Sexual Activity  . Alcohol use: No    Alcohol/week: 1.0 standard drinks    Types: 1 Cans of beer per week  . Drug use: No    Types: Cocaine    Comment: 10/31/2013 "might use cocaine once/month" - Has not used since 2015.   Marland Kitchen Sexual activity: Never  Lifestyle  . Physical activity    Days per week: Not on file    Minutes per session: Not on file  . Stress: Not on file  Relationships  . Social Herbalist on phone: Not on file    Gets together:  Not on file    Attends religious service: Not on file    Active member of club or organization: Not on file    Attends meetings of clubs or organizations: Not on file    Relationship status: Not on file  . Intimate partner violence    Fear of current or ex partner: Not on file    Emotionally abused: Not on file    Physically abused: Not on file    Forced sexual activity: Not on file  Other Topics Concern  . Not on file  Social History Narrative  . Not on file    Physical Exam Cardiovascular:     Rate and Rhythm: Normal rate and regular rhythm.     Pulses: Normal pulses.  Pulmonary:     Effort: Pulmonary effort is normal.     Breath sounds: Normal breath sounds.  Musculoskeletal: Normal range of motion.     Right lower leg: No edema.     Left lower leg: No edema.  Skin:    General: Skin is warm and dry.     Capillary Refill: Capillary refill takes less than 2  seconds.  Neurological:     Mental Status: He is alert and oriented to person, place, and time.  Psychiatric:        Mood and Affect: Mood normal.         Future Appointments  Date Time Provider [place]  12/06/2018  2:45 PM [facility] LAB [facility] None  12/17/2018  3:15 PM Frann Rider, NP [facility] None  01/03/2019  2:00 PM [facility] PA/NP [facility] None  02/21/2019  1:30 PM Eileen Stanford, PA-C [facility]    BP (!) 140/96 (BP Location: Left Arm, Patient Position: Sitting, Cuff Size: Normal)   Pulse 71   Wt 253 lb 1.6 oz (114.8 kg)   SpO2 94%   BMI 42.12 kg/m   Weight yesterday- 245 lb Last visit weight- 250 lb  Douglas [name] was seen at home today and reported feeling well. He denied chest pain, SOB, headache, dizziness, orthopnea, cough or fever since our last visit. He stated he has been compliant with his medications over the past two weeks but ran out of torsemide yesterday and thus his weight increased significantly over night. He had just received his torsemide from the pharmacy today and was getting back on track this evening. His medications were verified. I will follow up via phone call later this week to ensure his weight has started trending down.   [name], EMT 11/28/18  ACTION: Home visit completed Next visit planned for 1 week Next call planned for Friday

## 2018-12-03 ENCOUNTER — Other Ambulatory Visit: Payer: Self-pay

## 2018-12-03 ENCOUNTER — Ambulatory Visit (HOSPITAL_COMMUNITY)
Admission: EM | Admit: 2018-12-03 | Discharge: 2018-12-03 | Disposition: A | Discharge status: Home / Self Care | Payer: Medicaid Other | Attending: Family Medicine | Admitting: Family Medicine

## 2018-12-03 ENCOUNTER — Encounter (HOSPITAL_COMMUNITY): Payer: Self-pay

## 2018-12-03 DIAGNOSIS — M109 Gout, unspecified: Secondary | ICD-10-CM | POA: Diagnosis not present

## 2018-12-03 MED ORDER — PREDNISONE 20 MG PO TABS: 40.0000 mg | ORAL_TABLET | Freq: Every day | ORAL | 0 refills | Status: AC

## 2018-12-03 MED ORDER — PREDNISONE 20 MG PO TABS: 40.0000 mg | ORAL_TABLET | Freq: Every day | ORAL | 0 refills | Status: DC

## 2018-12-03 NOTE — ED Triage Notes (Signed)
Pt presents to UC w/ c/o left elbow pain since 1 week. Pt states he "slept on it" and now the left elbow is swollen, painful, and has decreased ROM.

## 2018-12-03 NOTE — Discharge Instructions (Signed)
I do think this is concerning for a gout vs bursitis.  Avoid resting your weight on your elbow or any trauma to the area.  Three days of prednisone.  Continue with your daily weights as recommended.  Please call tomorrow to make a follow up appointment with your pcp for recheck in the next week.  If develop fevers, no improvement or any worsening of pain please go to the Er.

## 2018-12-03 NOTE — ED Provider Notes (Signed)
[place]    CSN: [id number] Arrival date & time: 12/03/18  1845      History   Chief Complaint Chief Complaint  Patient presents with  . left elbow pain    HPI Douglas [name]. is a 33 y.o. male.   Douglas D Manning Jr. presents with complaints of right elbow pain and swelling which started approximately 1 week ago. He woke with the pain. No known injury. He feels like he might have slept on it. Denies any previous similar. Pain to touch and with extension of elbow. No fevers. History  Of gout to his foot which did feel similar, although never to his elbow. Denies any iv drug use. Patient with significant CHF, on O2 chronically, recent stroke and on a blood thinner. He is on a fluid restriction and takes diuretics.    ROS per HPI, negative if not otherwise mentioned.      Past Medical History:  Diagnosis Date  . Arthritis    "hands, feet" (10/31/2013)  . CHF (congestive heart failure) ([place])   . Chronic bronchitis ([place])   . High cholesterol   . Hypertension   . Obesity   . OSA on CPAP   . Pneumonia 09/2011  . Substance abuse ([place]) 8/31/213   cociane "first time use"  . Tobacco abuse     Patient Active Problem List   Diagnosis Date Noted  . CVA (cerebral vascular accident) ([place]) 10/14/2018  . Morbid obesity ([place]) 06/17/2015  . Essential hypertension 04/14/2015  . Hyperkalemia 04/14/2015  . OSA (obstructive sleep apnea)   . Difficult intravenous access   . Chronic diastolic CHF (congestive heart failure), NYHA class 3 ([place]) 09/13/2014  . Cor pulmonale, chronic ([place]) 03/19/2014  . OSA treated with BiPAP     Past Surgical History:  Procedure Laterality Date  . BUBBLE STUDY  10/15/2018   Procedure: BUBBLE STUDY;  Surgeon: Larey Dresser, MD;  Location: [facility] ENDOSCOPY;  Service: Cardiovascular;;  . NO PAST SURGERIES    . TEE WITHOUT CARDIOVERSION N/A 10/15/2018   Procedure: TRANSESOPHAGEAL ECHOCARDIOGRAM (TEE);  Surgeon: Larey Dresser, MD;   Location: [facility] ENDOSCOPY;  Service: Cardiovascular;  Laterality: N/A;  . TUBES IN EARS         Home Medications    Prior to Admission medications   Medication Sig Start Date End Date Taking? Authorizing Provider  apixaban (ELIQUIS) 5 MG TABS tablet Take 1 tablet (5 mg total) by mouth 2 (two) times daily. 10/17/18 11/28/18  Earlene Plater, MD  atorvastatin (LIPITOR) 80 MG tablet Take 1 tablet (80 mg total) by mouth daily at 6 PM. 10/17/18   Earlene Plater, MD  carvedilol (COREG) 6.25 MG tablet TAKE 1 AND 1/2 TABLET BY MOUTH TWICE A DAY 10/29/18   Larey Dresser, MD  hydrALAZINE (APRESOLINE) 25 MG tablet Take 1 tablet (25 mg total) by mouth 3 (three) times daily. 06/22/18   Larey Dresser, MD  OXYGEN Inhale 2 L/min into the lungs continuous.    [provider]  potassium chloride SA (K-DUR) 20 MEQ tablet Take 1 tablet (20 mEq total) by mouth daily. Patient not taking: Reported on 11/28/2018 10/19/18   Larey Dresser, MD  predniSONE (DELTASONE) 20 MG tablet Take 2 tablets (40 mg total) by mouth daily with breakfast for 3 days. 12/03/18 12/06/18  Zigmund Gottron, NP  PROVENTIL HFA 108 (90 Base) MCG/ACT inhaler INHALE 1 TO 2 PUFFS BY MOUTH EVERY 6 (SIX) HOURS AS NEEDED FOR  WHEEZING OR SHORTNESS OF BREATH Patient taking differently: Inhale 1-2 puffs into the lungs every 6 (six) hours as needed for wheezing.  07/24/18   [name], MD  torsemide (DEMADEX) 20 MG tablet Take 80 mg in am and 40 mg in pm 11/06/18   Darrick Grinder D, NP    Family History Family History  Problem Relation Age of Onset  . Heart disease Mother        Pacemaker  . Hypertension Father   . Cancer Paternal Grandfather   . Heart attack Neg Hx   . Stroke Neg Hx     Social History Social History   Tobacco Use  . Smoking status: Former Smoker    Packs/day: 1.50    Years: 3.00    Pack years: 4.50    Types: Cigarettes    Quit date: 10/31/2013    Years since quitting: 5.0  . Smokeless tobacco: Never Used   Substance Use Topics  . Alcohol use: No    Alcohol/week: 1.0 standard drinks    Types: 1 Cans of beer per week  . Drug use: No    Types: Cocaine    Comment: 10/31/2013 "might use cocaine once/month" - Has not used since 2015.      Allergies   Lisinopril   Review of Systems Review of Systems   Physical Exam Triage Vital Signs ED Triage Vitals  Enc Vitals Group     BP 12/03/18 1949 115/80     Pulse Rate 12/03/18 1949 65     Resp 12/03/18 1949 16     Temp 12/03/18 1949 98.8 F (37.1 C)     Temp Source 12/03/18 1949 Oral     SpO2 12/03/18 1949 95 %     Weight --      Height --      Head Circumference --      Peak Flow --      Pain Score 12/03/18 1954 8     Pain Loc --      Pain Edu? --      Excl. in [place]? --    No data found.  Updated Vital Signs BP 115/80 (BP Location: Left Arm)   Pulse 65   Temp 98.8 F (37.1 C) (Oral)   Resp 16   SpO2 95%    Physical Exam Constitutional:      Appearance: He is well-developed.  Cardiovascular:     Rate and Rhythm: Normal rate.  Pulmonary:     Effort: Pulmonary effort is normal.     Comments: O2 per [place] in place  Musculoskeletal:     Right elbow: He exhibits decreased range of motion and swelling. He exhibits no deformity and no laceration. Tenderness found.       Arms:     Comments: Right elbow with gross swelling and palpable fluid surrounding olecranon; pain with extension; mildly warm, no abscess or induration, no redness; strong radial pulse and hand sensation intact   Skin:    General: Skin is warm and dry.  Neurological:     Mental Status: He is alert and oriented to person, place, and time.      UC Treatments / Results  Labs (all labs ordered are listed, but only abnormal results are displayed) Labs Reviewed - No data to display  EKG   Radiology No results found.  Procedures Procedures (including critical care time)  Medications Ordered in UC Medications - No data to display  Initial Impression  / Assessment and Plan /  UC Course  I have reviewed the triage vital signs and the nursing notes.  Pertinent labs & imaging results that were available during my care of the patient were reviewed by me and considered in my medical decision making (see chart for details).     Gout vs bursitis discussed and considered. Low suspicion for septic joint. Significant restrictions for options of medications due to medical history. Three days of 40mg  of prednisone provided. Encouraged ice, elevation and light range of motion. If symptoms worsen or do not improve in the next week to return to be seen or to follow up with PCP.  Patient verbalized understanding and agreeable to plan.  Ambulatory out of clinic without difficulty.    Final Clinical Impressions(s) / UC Diagnoses   Final diagnoses:  Acute gout of right elbow, unspecified cause     Discharge Instructions     I do think this is concerning for a gout vs bursitis.  Avoid resting your weight on your elbow or any trauma to the area.  Three days of prednisone.  Continue with your daily weights as recommended.  Please call tomorrow to make a follow up appointment with your pcp for recheck in the next week.  If develop fevers, no improvement or any worsening of pain please go to the Er.    ED Prescriptions    Medication Sig Dispense Auth. Provider   predniSONE (DELTASONE) 20 MG tablet  (Status: Discontinued) Take 2 tablets (40 mg total) by mouth daily with breakfast for 3 days. 6 tablet Augusto Gamble B, NP   predniSONE (DELTASONE) 20 MG tablet Take 2 tablets (40 mg total) by mouth daily with breakfast for 3 days. 6 tablet Zigmund Gottron, NP     PDMP not reviewed this encounter.   Zigmund Gottron, NP 12/03/18 2108

## 2018-12-05 ENCOUNTER — Other Ambulatory Visit: Payer: Self-pay | Admitting: Pulmonary Disease

## 2018-12-06 ENCOUNTER — Telehealth (HOSPITAL_COMMUNITY): Payer: Self-pay

## 2018-12-06 ENCOUNTER — Other Ambulatory Visit (HOSPITAL_COMMUNITY): Payer: Medicaid Other

## 2018-12-06 NOTE — Telephone Encounter (Signed)
Mr Noblett called me to advised that he had canceled his appointment today and was not in need of anything. He stated he has rescheduled for next week and asked that I see him then.   [name], EMT 12/06/18

## 2018-12-11 ENCOUNTER — Telehealth (HOSPITAL_COMMUNITY): Payer: Self-pay

## 2018-12-11 NOTE — Telephone Encounter (Signed)
I called Mr Essary to schedule an appointment. He stated he would be in the clinic tomorrow and would be able to meet me there if I preferred. I advised him to be sure and bring his medications. He was understanding and agreeable.   [name], EMT 12/11/18

## 2018-12-12 ENCOUNTER — Ambulatory Visit (HOSPITAL_COMMUNITY)
Admission: RE | Admit: 2018-12-12 | Discharge: 2018-12-12 | Disposition: A | Discharge status: Home / Self Care | Payer: Medicaid Other | Source: Ambulatory Visit | Attending: Cardiology | Admitting: Cardiology

## 2018-12-12 ENCOUNTER — Other Ambulatory Visit: Payer: Self-pay

## 2018-12-12 DIAGNOSIS — I5032 Chronic diastolic (congestive) heart failure: Secondary | ICD-10-CM

## 2018-12-12 LAB — BASIC METABOLIC PANEL
Anion gap: 9 (ref 5–15)
BUN: 15 mg/dL (ref 6–20)
CO2: 45 mmol/L — ABNORMAL HIGH (ref 22–32)
Calcium: 9 mg/dL (ref 8.9–10.3)
Chloride: 86 mmol/L — ABNORMAL LOW (ref 98–111)
Creatinine, Ser: 1.19 mg/dL (ref 0.61–1.24)
GFR calc Af Amer: >60 mL/min (ref >60)
GFR calc non Af Amer: >60 mL/min (ref >60)
Glucose, Bld: 70 mg/dL (ref 70–99)
Potassium: 3.7 mmol/L (ref 3.5–5.1)
Sodium: 140 mmol/L (ref 135–145)

## 2018-12-13 ENCOUNTER — Telehealth (HOSPITAL_COMMUNITY): Payer: Self-pay

## 2018-12-13 NOTE — Telephone Encounter (Signed)
Called Patient to ask about his [facility] Study. She stated that she would contact the Company and I gave her the number. I told her to call the office if she any questions.

## 2018-12-14 ENCOUNTER — Telehealth (HOSPITAL_COMMUNITY): Payer: Self-pay | Admitting: Licensed Clinical Social Worker

## 2018-12-14 ENCOUNTER — Other Ambulatory Visit (HOSPITAL_COMMUNITY): Payer: Self-pay

## 2018-12-14 DIAGNOSIS — R0683 Snoring: Secondary | ICD-10-CM

## 2018-12-14 NOTE — Telephone Encounter (Signed)
CSW informed by community paramedic that pt was informed by [facility] sleep study that it would be $300.  CSW called [facility] to discuss- they confirmed that they are not in network with [place] Medicaid so it would be self pay cost for the pt.  CSW informed pt and he is agreeable to in person test so that it would be covered by Medicaid- CSW had clinic staff place order and reach out to schedule.  CSW will continue to follow and assist as needed  Douglas [name], [facility] Desk#: [phone] Cell#: [phone]

## 2018-12-17 ENCOUNTER — Inpatient Hospital Stay: Payer: Medicaid Other | Admitting: Adult Health

## 2018-12-19 ENCOUNTER — Other Ambulatory Visit (HOSPITAL_COMMUNITY): Payer: Self-pay

## 2018-12-19 NOTE — Progress Notes (Signed)
Paramedicine Encounter    Patient ID: Douglas Madura., male    DOB: August 18, 1985, 33 y.o.   MRN: [id number]   Patient Care Team: Medicine, [facility] as PCP - General  Patient Active Problem List   Diagnosis Date Noted  . CVA (cerebral vascular accident) ([place]) 10/14/2018  . Morbid obesity ([place]) 06/17/2015  . Essential hypertension 04/14/2015  . Hyperkalemia 04/14/2015  . OSA (obstructive sleep apnea)   . Difficult intravenous access   . Chronic diastolic CHF (congestive heart failure), NYHA class 3 ([place]) 09/13/2014  . Cor pulmonale, chronic ([place]) 03/19/2014  . OSA treated with BiPAP     Current Outpatient Medications:  .  apixaban (ELIQUIS) 5 MG TABS tablet, Take 1 tablet (5 mg total) by mouth 2 (two) times daily., Disp: 60 tablet, Rfl: 3 .  atorvastatin (LIPITOR) 80 MG tablet, Take 1 tablet (80 mg total) by mouth daily at 6 PM., Disp: 30 tablet, Rfl: 11 .  carvedilol (COREG) 6.25 MG tablet, TAKE 1 AND 1/2 TABLET BY MOUTH TWICE A DAY, Disp: 90 tablet, Rfl: 5 .  hydrALAZINE (APRESOLINE) 25 MG tablet, Take 1 tablet (25 mg total) by mouth 3 (three) times daily., Disp: 270 tablet, Rfl: 1 .  OXYGEN, Inhale 2 L/min into the lungs continuous., Disp: , Rfl:  .  potassium chloride SA (K-DUR) 20 MEQ tablet, Take 1 tablet (20 mEq total) by mouth daily., Disp: 30 tablet, Rfl: 3 .  PROVENTIL HFA 108 (90 Base) MCG/ACT inhaler, INHALE 1 TO 2 PUFFS BY MOUTH EVERY 6 (SIX) HOURS AS NEEDED FOR WHEEZING OR SHORTNESS OF BREATH (Patient taking differently: Inhale 1-2 puffs into the lungs every 6 (six) hours as needed for wheezing. ), Disp: 6.7 g, Rfl: 0 .  torsemide (DEMADEX) 20 MG tablet, Take 80 mg in am and 40 mg in pm, Disp: 180 tablet, Rfl: 11 Allergies  Allergen Reactions  . Lisinopril Cough      Social History   Socioeconomic History  . Marital status: Single    Spouse name: Not on file  . Number of children: 2  . Years of education: Not on file  . Highest education  level: Not on file  Occupational History  . Occupation: N/A    Employer: NOT EMPLOYED  Social Needs  . Financial resource strain: Not on file  . Food insecurity    Worry: Not on file    Inability: Not on file  . Transportation needs    Medical: Not on file    Non-medical: Not on file  Tobacco Use  . Smoking status: Former Smoker    Packs/day: 1.50    Years: 3.00    Pack years: 4.50    Types: Cigarettes    Quit date: 10/31/2013    Years since quitting: 5.1  . Smokeless tobacco: Never Used  Substance and Sexual Activity  . Alcohol use: No    Alcohol/week: 1.0 standard drinks    Types: 1 Cans of beer per week  . Drug use: No    Types: Cocaine    Comment: 10/31/2013 "might use cocaine once/month" - Has not used since 2015.   Marland Kitchen Sexual activity: Never  Lifestyle  . Physical activity    Days per week: Not on file    Minutes per session: Not on file  . Stress: Not on file  Relationships  . Social Herbalist on phone: Not on file    Gets together: Not on file  Attends religious service: Not on file    Active member of club or organization: Not on file    Attends meetings of clubs or organizations: Not on file    Relationship status: Not on file  . Intimate partner violence    Fear of current or ex partner: Not on file    Emotionally abused: Not on file    Physically abused: Not on file    Forced sexual activity: Not on file  Other Topics Concern  . Not on file  Social History Narrative  . Not on file    Physical Exam Cardiovascular:     Rate and Rhythm: Normal rate and regular rhythm.     Pulses: Normal pulses.  Pulmonary:     Effort: Pulmonary effort is normal.     Breath sounds: Normal breath sounds.  Musculoskeletal: Normal range of motion.     Right lower leg: No edema.     Left lower leg: No edema.  Skin:    General: Skin is warm and dry.     Capillary Refill: Capillary refill takes less than 2 seconds.  Neurological:     Mental Status: He is  alert and oriented to person, place, and time.  Psychiatric:        Mood and Affect: Mood normal.         Future Appointments  Date Time Provider [place]  01/03/2019  2:00 PM [facility] PA/NP [facility] None  02/21/2019  1:30 PM Eileen Stanford, PA-C [facility]    BP 104/68 (BP Location: Left Arm, Patient Position: Sitting, Cuff Size: Normal)   Pulse 70   Resp 18   Wt 252 lb (114.3 kg)   SpO2 95%   BMI 41.93 kg/m   Weight yesterday- 250 Last visit weight- 253 lb  Mr Lotti was seen at home today and reported feeling generally well. He denied chest pain, SOB, headache, dizziness, orthopnea, fever or cough since our last visit. He stated he has been compliant with his medications and his weight has been stable. His medications were verified and he was not in need of any refills. I will follow up next week.   [name], EMT 12/19/18  ACTION: Home visit completed Next visit planned for 1 week

## 2019-01-02 ENCOUNTER — Other Ambulatory Visit (HOSPITAL_COMMUNITY): Payer: Self-pay | Admitting: Cardiology

## 2019-01-03 ENCOUNTER — Encounter (HOSPITAL_COMMUNITY): Payer: Medicaid Other

## 2019-01-04 ENCOUNTER — Telehealth (HOSPITAL_COMMUNITY): Payer: Self-pay

## 2019-01-04 NOTE — Telephone Encounter (Signed)
I called [name] to see if he was available to meet today. He stated he was not because he was getting the keys to his new apartment this afternoon and was going to start moving. I advised that we should meet next week so we can reschedule his clinic appointment. He was understanding and agreeable.   [name], EMT 01/04/19

## 2019-01-10 ENCOUNTER — Other Ambulatory Visit (HOSPITAL_COMMUNITY): Payer: Self-pay

## 2019-01-10 NOTE — Progress Notes (Signed)
Paramedicine Encounter    Patient ID: [name], male    DOB: 06-20-1985, 33 y.o.   MRN: [id number]   Patient Care Team: Medicine, [facility] as PCP - General  Patient Active Problem List   Diagnosis Date Noted  . CVA (cerebral vascular accident) ([place]) 10/14/2018  . Morbid obesity ([place]) 06/17/2015  . Essential hypertension 04/14/2015  . Hyperkalemia 04/14/2015  . OSA (obstructive sleep apnea)   . Difficult intravenous access   . Chronic diastolic CHF (congestive heart failure), NYHA class 3 ([place]) 09/13/2014  . Cor pulmonale, chronic ([place]) 03/19/2014  . OSA treated with BiPAP     Current Outpatient Medications:  .  apixaban (ELIQUIS) 5 MG TABS tablet, Take 1 tablet (5 mg total) by mouth 2 (two) times daily., Disp: 60 tablet, Rfl: 3 .  atorvastatin (LIPITOR) 80 MG tablet, Take 1 tablet (80 mg total) by mouth daily at 6 PM., Disp: 30 tablet, Rfl: 11 .  carvedilol (COREG) 6.25 MG tablet, TAKE 1 AND 1/2 TABLET BY MOUTH TWICE A DAY, Disp: 90 tablet, Rfl: 5 .  hydrALAZINE (APRESOLINE) 25 MG tablet, TAKE 1 TABLET (25 MG TOTAL) BY MOUTH 3 (THREE) TIMES DAILY., Disp: 270 tablet, Rfl: 1 .  OXYGEN, Inhale 2 L/min into the lungs continuous., Disp: , Rfl:  .  potassium chloride SA (K-DUR) 20 MEQ tablet, Take 1 tablet (20 mEq total) by mouth daily., Disp: 30 tablet, Rfl: 3 .  PROVENTIL HFA 108 (90 Base) MCG/ACT inhaler, INHALE 1 TO 2 PUFFS BY MOUTH EVERY 6 (SIX) HOURS AS NEEDED FOR WHEEZING OR SHORTNESS OF BREATH (Patient taking differently: Inhale 1-2 puffs into the lungs every 6 (six) hours as needed for wheezing. ), Disp: 6.7 g, Rfl: 0 .  torsemide (DEMADEX) 20 MG tablet, Take 80 mg in am and 40 mg in pm, Disp: 180 tablet, Rfl: 11 Allergies  Allergen Reactions  . Lisinopril Cough      Social History   Socioeconomic History  . Marital status: Single    Spouse name: Not on file  . Number of children: 2  . Years of education: Not on file  . Highest education  level: Not on file  Occupational History  . Occupation: N/A    Employer: NOT EMPLOYED  Tobacco Use  . Smoking status: Former Smoker    Packs/day: 1.50    Years: 3.00    Pack years: 4.50    Types: Cigarettes    Quit date: 10/31/2013    Years since quitting: 5.1  . Smokeless tobacco: Never Used  Substance and Sexual Activity  . Alcohol use: No    Alcohol/week: 1.0 standard drinks    Types: 1 Cans of beer per week  . Drug use: No    Types: Cocaine    Comment: 10/31/2013 "might use cocaine once/month" - Has not used since 2015.   Marland Kitchen Sexual activity: Never  Other Topics Concern  . Not on file  Social History Narrative  . Not on file   Social Determinants of Health   Financial Resource Strain:   . Difficulty of Paying Living Expenses: Not on file  Food Insecurity:   . Worried About Charity fundraiser in the Last Year: Not on file  . Ran Out of Food in the Last Year: Not on file  Transportation Needs:   . Lack of Transportation (Medical): Not on file  . Lack of Transportation (Non-Medical): Not on file  Physical Activity:   . Days of Exercise  per Week: Not on file  . Minutes of Exercise per Session: Not on file  Stress:   . Feeling of Stress : Not on file  Social Connections:   . Frequency of Communication with Friends and Family: Not on file  . Frequency of Social Gatherings with Friends and Family: Not on file  . Attends Religious Services: Not on file  . Active Member of Clubs or Organizations: Not on file  . Attends Archivist Meetings: Not on file  . Marital Status: Not on file  Intimate Partner Violence:   . Fear of Current or Ex-Partner: Not on file  . Emotionally Abused: Not on file  . Physically Abused: Not on file  . Sexually Abused: Not on file    Physical Exam Cardiovascular:     Rate and Rhythm: Normal rate and regular rhythm.     Pulses: Normal pulses.  Pulmonary:     Effort: Pulmonary effort is normal.     Breath sounds: Normal breath  sounds.  Musculoskeletal:        General: Normal range of motion.     Right lower leg: No edema.     Left lower leg: No edema.  Skin:    General: Skin is warm and dry.     Capillary Refill: Capillary refill takes less than 2 seconds.  Neurological:     Mental Status: He is alert and oriented to person, place, and time.  Psychiatric:        Mood and Affect: Mood normal.         Future Appointments  Date Time Provider [place]  01/29/2019  2:00 PM [facility] PA/NP [facility] None  02/21/2019  1:30 PM Eileen Stanford, PA-C [facility]    BP 130/90 (BP Location: Left Arm, Patient Position: Sitting, Cuff Size: Large)   Pulse 60   Resp 16   Wt 255 lb (115.7 kg)   SpO2 93%   BMI 42.43 kg/m   Weight yesterday- 256 lb Last visit weight- 252 lb  Mr Luevano was seen at home today and reported feeling well. He denied chest pain, SOB, headache, dizziness, orthopnea, fever or cough since our last visit. He stated he has been compliant with his medications over the past several week sand his weight remains stable. His medications were verified and he was not in need of any refills. I will follow up next week.   [name], EMT 01/10/19  ACTION: Home visit completed Next visit planned for 1 week

## 2019-01-11 ENCOUNTER — Other Ambulatory Visit: Payer: Self-pay | Admitting: Pulmonary Disease

## 2019-01-15 ENCOUNTER — Other Ambulatory Visit: Payer: Self-pay | Admitting: Internal Medicine

## 2019-01-29 ENCOUNTER — Encounter (HOSPITAL_COMMUNITY): Payer: Medicaid Other

## 2019-01-29 ENCOUNTER — Telehealth (HOSPITAL_COMMUNITY): Payer: Self-pay

## 2019-01-29 NOTE — Telephone Encounter (Signed)
LMOM for patient to return call for pre covid screening questions before his appointment this afternoon.

## 2019-01-31 ENCOUNTER — Telehealth (HOSPITAL_COMMUNITY): Payer: Self-pay

## 2019-01-31 NOTE — Telephone Encounter (Signed)
I called Douglas [name] to see if he was available for an appointment. He stated he has not available this week and asked if I could see him next Wednesday afternoon. I will firm up a time next week.   [name], EMT 01/31/19

## 2019-02-07 ENCOUNTER — Other Ambulatory Visit (HOSPITAL_COMMUNITY): Payer: Self-pay

## 2019-02-07 NOTE — Progress Notes (Signed)
Paramedicine Encounter    Patient ID: Douglas Madura., male    DOB: [birth year], 34 y.o.   MRN: [id number]   Patient Care Team: Medicine, [facility] as PCP - General  Patient Active Problem List   Diagnosis Date Noted  . CVA (cerebral vascular accident) ([place]) 10/14/2018  . Morbid obesity ([place]) 06/17/2015  . Essential hypertension 04/14/2015  . Hyperkalemia 04/14/2015  . OSA (obstructive sleep apnea)   . Difficult intravenous access   . Chronic diastolic CHF (congestive heart failure), NYHA class 3 ([place]) 09/13/2014  . Cor pulmonale, chronic ([place]) 03/19/2014  . OSA treated with BiPAP     Current Outpatient Medications:  .  apixaban (ELIQUIS) 5 MG TABS tablet, Take 1 tablet (5 mg total) by mouth 2 (two) times daily., Disp: 60 tablet, Rfl: 3 .  atorvastatin (LIPITOR) 80 MG tablet, Take 1 tablet (80 mg total) by mouth daily at 6 PM., Disp: 30 tablet, Rfl: 11 .  carvedilol (COREG) 6.25 MG tablet, TAKE 1 AND 1/2 TABLET BY MOUTH TWICE A DAY, Disp: 90 tablet, Rfl: 5 .  hydrALAZINE (APRESOLINE) 25 MG tablet, TAKE 1 TABLET (25 MG TOTAL) BY MOUTH 3 (THREE) TIMES DAILY., Disp: 270 tablet, Rfl: 1 .  OXYGEN, Inhale 2 L/min into the lungs continuous., Disp: , Rfl:  .  potassium chloride SA (K-DUR) 20 MEQ tablet, Take 1 tablet (20 mEq total) by mouth daily., Disp: 30 tablet, Rfl: 3 .  PROVENTIL HFA 108 (90 Base) MCG/ACT inhaler, INHALE 1 TO 2 PUFFS BY MOUTH EVERY 6 (SIX) HOURS AS NEEDED FOR WHEEZING OR SHORTNESS OF BREATH (Patient taking differently: Inhale 1-2 puffs into the lungs every 6 (six) hours as needed for wheezing. ), Disp: 6.7 g, Rfl: 0 .  torsemide (DEMADEX) 20 MG tablet, Take 80 mg in am and 40 mg in pm, Disp: 180 tablet, Rfl: 11 Allergies  Allergen Reactions  . Lisinopril Cough      Social History   Socioeconomic History  . Marital status: Single    Spouse name: Not on file  . Number of children: 2  . Years of education: Not on file  . Highest education  level: Not on file  Occupational History  . Occupation: N/A    Employer: NOT EMPLOYED  Tobacco Use  . Smoking status: Former Smoker    Packs/day: 1.50    Years: 3.00    Pack years: 4.50    Types: Cigarettes    Quit date: 10/31/2013    Years since quitting: 5.2  . Smokeless tobacco: Never Used  Substance and Sexual Activity  . Alcohol use: No    Alcohol/week: 1.0 standard drinks    Types: 1 Cans of beer per week  . Drug use: No    Types: Cocaine    Comment: 10/31/2013 "might use cocaine once/month" - Has not used since 2015.   Marland Kitchen Sexual activity: Never  Other Topics Concern  . Not on file  Social History Narrative  . Not on file   Social Determinants of Health   Financial Resource Strain:   . Difficulty of Paying Living Expenses: Not on file  Food Insecurity:   . Worried About Charity fundraiser in the Last Year: Not on file  . Ran Out of Food in the Last Year: Not on file  Transportation Needs:   . Lack of Transportation (Medical): Not on file  . Lack of Transportation (Non-Medical): Not on file  Physical Activity:   . Days of Exercise  per Week: Not on file  . Minutes of Exercise per Session: Not on file  Stress:   . Feeling of Stress : Not on file  Social Connections:   . Frequency of Communication with Friends and Family: Not on file  . Frequency of Social Gatherings with Friends and Family: Not on file  . Attends Religious Services: Not on file  . Active Member of Clubs or Organizations: Not on file  . Attends Archivist Meetings: Not on file  . Marital Status: Not on file  Intimate Partner Violence:   . Fear of Current or Ex-Partner: Not on file  . Emotionally Abused: Not on file  . Physically Abused: Not on file  . Sexually Abused: Not on file    Physical Exam Cardiovascular:     Rate and Rhythm: Normal rate and regular rhythm.     Pulses: Normal pulses.  Pulmonary:     Effort: Pulmonary effort is normal.     Breath sounds: Normal breath  sounds.  Abdominal:     General: There is no distension.  Musculoskeletal:        General: Normal range of motion.     Right lower leg: No edema.     Left lower leg: No edema.  Skin:    General: Skin is warm and dry.     Capillary Refill: Capillary refill takes less than 2 seconds.  Neurological:     Mental Status: He is alert and oriented to person, place, and time.  Psychiatric:        Mood and Affect: Mood normal.         Future Appointments  Date Time Provider [place]  02/14/2019  2:00 PM [facility] PA/NP [facility] None  02/21/2019  1:30 PM Eileen Stanford, PA-C [facility]    BP 122/86 (BP Location: Left Arm, Patient Position: Sitting, Cuff Size: Normal)   Pulse 68   Resp 18   Wt 264 lb (119.7 kg)   SpO2 98%   BMI 43.93 kg/m   Weight yesterday- 259 lb Last visit weight- 255 lb  Douglas [name] was seen at home today and reported feeling generally well. He denied chest pain, SOB, headache, dizziness, orthopnea, fever or cough. He stated he has been compliant with his medications but his weight has increased 5 lb since yesterday. He said he has not been drinking too much but his diet has been sodium rich. I contacted the clinic and they advised to have him take 80 mg BID for the next two days and stay away from salt. He was understanding and agreeable. I will follow up next week.  [name], EMT 02/07/19  ACTION: Home visit completed Next visit planned for 1 week

## 2019-02-14 ENCOUNTER — Encounter (HOSPITAL_COMMUNITY): Payer: Self-pay

## 2019-02-14 ENCOUNTER — Other Ambulatory Visit: Payer: Self-pay

## 2019-02-14 ENCOUNTER — Ambulatory Visit (HOSPITAL_COMMUNITY)
Admission: RE | Admit: 2019-02-14 | Discharge: 2019-02-14 | Disposition: A | Discharge status: Home / Self Care | Payer: Medicaid Other | Source: Ambulatory Visit | Attending: Cardiology | Admitting: Cardiology

## 2019-02-14 ENCOUNTER — Other Ambulatory Visit (HOSPITAL_COMMUNITY): Payer: Self-pay

## 2019-02-14 VITALS — BP 116/82 | HR 75 | Wt 266.8 lb

## 2019-02-14 DIAGNOSIS — I2510 Atherosclerotic heart disease of native coronary artery without angina pectoris: Secondary | ICD-10-CM | POA: Diagnosis not present

## 2019-02-14 DIAGNOSIS — I5032 Chronic diastolic (congestive) heart failure: Secondary | ICD-10-CM | POA: Diagnosis not present

## 2019-02-14 DIAGNOSIS — Z8249 Family history of ischemic heart disease and other diseases of the circulatory system: Secondary | ICD-10-CM | POA: Insufficient documentation

## 2019-02-14 DIAGNOSIS — Z79899 Other long term (current) drug therapy: Secondary | ICD-10-CM | POA: Insufficient documentation

## 2019-02-14 DIAGNOSIS — Z87891 Personal history of nicotine dependence: Secondary | ICD-10-CM | POA: Diagnosis not present

## 2019-02-14 DIAGNOSIS — Q211 Atrial septal defect: Secondary | ICD-10-CM | POA: Insufficient documentation

## 2019-02-14 DIAGNOSIS — I110 Hypertensive heart disease with heart failure: Secondary | ICD-10-CM | POA: Insufficient documentation

## 2019-02-14 DIAGNOSIS — Z7901 Long term (current) use of anticoagulants: Secondary | ICD-10-CM | POA: Diagnosis not present

## 2019-02-14 DIAGNOSIS — I4891 Unspecified atrial fibrillation: Secondary | ICD-10-CM | POA: Diagnosis not present

## 2019-02-14 DIAGNOSIS — M1990 Unspecified osteoarthritis, unspecified site: Secondary | ICD-10-CM | POA: Diagnosis not present

## 2019-02-14 DIAGNOSIS — G4733 Obstructive sleep apnea (adult) (pediatric): Secondary | ICD-10-CM | POA: Diagnosis not present

## 2019-02-14 DIAGNOSIS — E7800 Pure hypercholesterolemia, unspecified: Secondary | ICD-10-CM | POA: Insufficient documentation

## 2019-02-14 LAB — BRAIN NATRIURETIC PEPTIDE: B Natriuretic Peptide: 437.7 pg/mL — ABNORMAL HIGH (ref 0.0–100.0)

## 2019-02-14 LAB — LIPID PANEL
Cholesterol: 115 mg/dL (ref 0–200)
HDL: 46 mg/dL (ref >40)
LDL Cholesterol: 59 mg/dL (ref 0–99)
Total CHOL/HDL Ratio: 2.5 RATIO
Triglycerides: 49 mg/dL (ref <150)
VLDL: 10 mg/dL (ref 0–40)

## 2019-02-14 LAB — HEPATIC FUNCTION PANEL
ALT: 16 U/L (ref 0–44)
AST: 20 U/L (ref 15–41)
Albumin: 3.3 g/dL — ABNORMAL LOW (ref 3.5–5.0)
Alkaline Phosphatase: 68 U/L (ref 38–126)
Bilirubin, Direct: 0.2 mg/dL (ref 0.0–0.2)
Indirect Bilirubin: 0.9 mg/dL (ref 0.3–0.9)
Total Bilirubin: 1.1 mg/dL (ref 0.3–1.2)
Total Protein: 6.7 g/dL (ref 6.5–8.1)

## 2019-02-14 LAB — BASIC METABOLIC PANEL
Anion gap: 12 (ref 5–15)
BUN: 13 mg/dL (ref 6–20)
CO2: 42 mmol/L — ABNORMAL HIGH (ref 22–32)
Calcium: 8.8 mg/dL — ABNORMAL LOW (ref 8.9–10.3)
Chloride: 83 mmol/L — ABNORMAL LOW (ref 98–111)
Creatinine, Ser: 1.35 mg/dL — ABNORMAL HIGH (ref 0.61–1.24)
GFR calc Af Amer: >60 mL/min (ref >60)
GFR calc non Af Amer: >60 mL/min (ref >60)
Glucose, Bld: 91 mg/dL (ref 70–99)
Potassium: 4.1 mmol/L (ref 3.5–5.1)
Sodium: 137 mmol/L (ref 135–145)

## 2019-02-14 LAB — CBC
HCT: 63.4 % — ABNORMAL HIGH (ref 39.0–52.0)
Hemoglobin: 17.8 g/dL — ABNORMAL HIGH (ref 13.0–17.0)
MCH: 29.3 pg (ref 26.0–34.0)
MCHC: 28.1 g/dL — ABNORMAL LOW (ref 30.0–36.0)
MCV: 104.4 fL — ABNORMAL HIGH (ref 80.0–100.0)
Platelets: 151 10*3/uL (ref 150–400)
RBC: 6.07 MIL/uL — ABNORMAL HIGH (ref 4.22–5.81)
RDW: 14.3 % (ref 11.5–15.5)
WBC: 7.4 10*3/uL (ref 4.0–10.5)
nRBC: 0 % (ref 0.0–0.2)

## 2019-02-14 NOTE — Patient Instructions (Signed)
Lab work done today. We will notify you of any abnormal lab work. No news is good news!  Please follow up with the [facility] in 2-3 months.  At the [facility], you and your health needs are our priority. As part of our continuing mission to provide you with exceptional heart care, we have created designated Provider Care Teams. These Care Teams include your primary Cardiologist (physician) and Advanced Practice Providers (APPs- Physician Assistants and Nurse Practitioners) who all work together to provide you with the care you need, when you need it.   You may see any of the following providers on your designated Care Team at your next follow up: . Dr Daniel Bensimhon . Dr Dalton McLean . [name] Clegg, NP . Brittainy Simmons, PA . Lauren Kemp, PharmD   Please be sure to bring in all your medications bottles to every appointment.    

## 2019-02-14 NOTE — Progress Notes (Signed)
Paramedicine Encounter   Patient ID: Douglas [name] , male,   DOB: September 19, 1985,33 y.o.,  MRN: [id number]   [name] was seen at the HF clinic today and reported feeling well. He was not in need of anything and per [facility], he did not need any medication changes. I will follow up in two weeks unless it becomes necessary sooner.   [name], EMT 02/14/2019   ACTION: Home visit completed Next visit planned for 2 weeks

## 2019-02-14 NOTE — Progress Notes (Signed)
Patient ID: Douglas [name]., male   DOB: [birth year], 34 y.o.   MRN: [id number]    Advanced Heart Failure Clinic Note   PCP: none.  Primary Cardiologist: Dr Aundra Dubin  Pulmonary: Dr Halford Chessman    HPI: Douglas [name] is a 34 y.o. with OHS/OSA, morbid obesity, HTN, diastolic CHF with prominent RV failure.   Admitted in August 2016  to [facility] with volume overload. Diuresed with lasix drip and diamox. Transitioned to lasix 80 mg twice a day. Discharge weight 257 pounds.   Admit to [facility] 3/21 through 04/28/15 with respiratory distress. Required short term intubation. Apparently had not been wearing BiPap. Discharge weight was 259 pounds.   Admitted with AMS in the setting of acute stroke in 9/20. Suspect that he had a cardio-embolic CVA. On TEE,  swirling with spontaneous contrast noted in the severely dilated RA. There was a moderate-sized PFO with bidirectional shunting. Strongly suspect this was the source of his CVA. Atrial fibrillation has not been seen, no LA appendage thrombus.  Coronary CTA in 9/20 showed nonobstructive mild CAD.  Discharged on Eliquis.  He saw Dr. Burt Knack and plans is to set up for PFO closure. He has follow up visit in his clinic on 1/28.   He returns today for follow-up.  Here with paramedicine.  He reports he has done well since his last clinic visit.  No new complaints.  He remains on supplemental O2, 2 L/min baseline.  No increased oxygen requirements.  He denies chest pain.  No strokelike symptoms.  Reports full medication compliance.  No abnormal bleeding nor falls with Eliquis.  At last clinic visit he was instructed to get sleep study done but not yet completed.  He has had gradual weight gain over the last several months but denies any sudden changes.  No lower extremity edema, orthopnea or PND.   ECG : Not performed today  - ECHO 03/20/2014 EF 60-65% - ECHO 8/18: EF 55-60%, grade II diastolic dydsfunction, D-shaped septum suggesting RV pressure/volume overload, severe RV  dilation with mild-moderately decreased RV systolic function, unable to estimate PA pressure.  - ECHO 5/20: EF 55-60%, moderate RV dilation with mildly decreased systolic function, unable to estimate PASP, D-shaped interventricular septum.  - TEE 9/20: EF 55%, severely dilated RV with moderately decreased systolic function, D-shaped septum, large PFO with bidirectional shunting, severe RAE with spontaneous contrast in RA.  - Coronary CTA (9/20): Nonobstructive disease.   Review of systems complete and found to be negative unless listed in HPI.   SH:  Social History   Socioeconomic History  . Marital status: Single    Spouse name: Not on file  . Number of children: 2  . Years of education: Not on file  . Highest education level: Not on file  Occupational History  . Occupation: N/A    Employer: NOT EMPLOYED  Tobacco Use  . Smoking status: Former Smoker    Packs/day: 1.50    Years: 3.00    Pack years: 4.50    Types: Cigarettes    Quit date: 10/31/2013    Years since quitting: 5.2  . Smokeless tobacco: Never Used  Substance and Sexual Activity  . Alcohol use: No    Alcohol/week: 1.0 standard drinks    Types: 1 Cans of beer per week  . Drug use: No    Types: Cocaine    Comment: 10/31/2013 "might use cocaine once/month" - Has not used since 2015.   Marland Kitchen Sexual activity: Never  Other Topics Concern  .  Not on file  Social History Narrative  . Not on file   Social Determinants of Health   Financial Resource Strain:   . Difficulty of Paying Living Expenses: Not on file  Food Insecurity:   . Worried About Charity fundraiser in the Last Year: Not on file  . Ran Out of Food in the Last Year: Not on file  Transportation Needs:   . Lack of Transportation (Medical): Not on file  . Lack of Transportation (Non-Medical): Not on file  Physical Activity:   . Days of Exercise per Week: Not on file  . Minutes of Exercise per Session: Not on file  Stress:   . Feeling of Stress : Not on  file  Social Connections:   . Frequency of Communication with Friends and Family: Not on file  . Frequency of Social Gatherings with Friends and Family: Not on file  . Attends Religious Services: Not on file  . Active Member of Clubs or Organizations: Not on file  . Attends Archivist Meetings: Not on file  . Marital Status: Not on file  Intimate Partner Violence:   . Fear of Current or Ex-Partner: Not on file  . Emotionally Abused: Not on file  . Physically Abused: Not on file  . Sexually Abused: Not on file    FH:  Family History  Problem Relation Age of Onset  . Heart disease Mother        Pacemaker  . Hypertension Father   . Cancer Paternal Grandfather   . Heart attack Neg Hx   . Stroke Neg Hx     Past Medical History:  Diagnosis Date  . Arthritis    "hands, feet" (10/31/2013)  . CHF (congestive heart failure) ([place])   . Chronic bronchitis ([place])   . High cholesterol   . Hypertension   . Obesity   . OSA on CPAP   . Pneumonia 09/2011  . Substance abuse ([place]) 8/31/213   cociane "first time use"  . Tobacco abuse     Current Outpatient Medications  Medication Sig Dispense Refill  . atorvastatin (LIPITOR) 80 MG tablet Take 1 tablet (80 mg total) by mouth daily at 6 PM. 30 tablet 11  . carvedilol (COREG) 6.25 MG tablet TAKE 1 AND 1/2 TABLET BY MOUTH TWICE A DAY 90 tablet 5  . hydrALAZINE (APRESOLINE) 25 MG tablet TAKE 1 TABLET (25 MG TOTAL) BY MOUTH 3 (THREE) TIMES DAILY. 270 tablet 1  . OXYGEN Inhale 2 L/min into the lungs continuous.    . potassium chloride SA (K-DUR) 20 MEQ tablet Take 1 tablet (20 mEq total) by mouth daily. 30 tablet 3  . PROVENTIL HFA 108 (90 Base) MCG/ACT inhaler INHALE 1 TO 2 PUFFS BY MOUTH EVERY 6 (SIX) HOURS AS NEEDED FOR WHEEZING OR SHORTNESS OF BREATH (Patient taking differently: Inhale 1-2 puffs into the lungs every 6 (six) hours as needed for wheezing. ) 6.7 g 0  . torsemide (DEMADEX) 20 MG tablet Take 80 mg in am and 40 mg in pm 180  tablet 11  . apixaban (ELIQUIS) 5 MG TABS tablet Take 1 tablet (5 mg total) by mouth 2 (two) times daily. 60 tablet 3   No current facility-administered medications for this encounter.    Vitals:   02/14/19 1420  BP: 116/82  Pulse: 75  SpO2: 90%  Weight: 121 kg (266 lb 12.8 oz)   Wt Readings from Last 3 Encounters:  02/14/19 121 kg (266 lb 12.8 oz)  02/07/19 119.7 kg (264 lb)  01/10/19 115.7 kg (255 lb)    Vitals:   02/14/19 1420  BP: 116/82  Pulse: 75  SpO2: 90%    PHYSICAL EXAM: PHYSICAL EXAM: General:  Well appearing. No respiratory difficulty HEENT: normal Neck: supple. no JVD. Carotids 2+ bilat; no bruits. No lymphadenopathy or thyromegaly appreciated. Cor: PMI nondisplaced. Regular rate & rhythm. No rubs, gallops or murmurs. Lungs: clear Abdomen: Obese but soft, nontender, nondistended. No hepatosplenomegaly. No bruits or masses. Good bowel sounds. Extremities: no cyanosis, clubbing, rash, edema Neuro: alert & oriented x 3, cranial nerves grossly intact. moves all 4 extremities w/o difficulty. Affect pleasant.   ASSESSMENT & PLAN:  1.Chronic diastolic CHF with prominent RV failure: TEE in 9/20 showed EF 55%, severe RV dilation with moderately decreased systolic function, D-shaped septum.  RV failure is in the setting of OSA/OHS.  RVH on ECG.  Stable NYHA class II symptoms. -He has had gradual weight gain over the last several months but denies any sudden changes.  No lower extremity edema.  He is moderately obese, making physical exam assessment a bit difficult.  Body habitus not ideal for accurate ReDs clip assessment.  Will check BNP to better assess volume status -For now continue torsemide 80 qam/40 qpm.  Will adjust dose if elevated BNP.  Check BMET today.  - Continue Coreg 9.375 mg BID -Continue with paramedicine 2. OHS/OSA:  CPAP was removed because he stopped using it. Needs another sleep study. He is using home oxygen at all times.  - Continue home oxygen.    -We will arrange for sleep study to be done, needs to get on CPAP.  3. HTN:  Controlled on current regimen - Continue torsemide, Coreg and hydralazine.  4. CAD: Nonobstructive on coronary CTA in 9/20.  - Continue statin.  He is fasting today.  We will check fasting lipid panel and hepatic function test. -No signs and symptoms of ischemia 5. Stroke: L MCA stroke secondary to PFO with paradoxical emboli. PFO with bidirectional flow was found on TEE, suggesting stroke was related to paradoxical emboli.  DVT studies negative.  Atrial fibrillation not visualized.  - Eliquis 5 mg twice daily.  Check CBC today 6. PFO: He has seen Dr. Burt Knack, plan for PFO closure.  Has upcoming follow-up visit to discuss planning on January 28.  F/u w/ Dr. Aundra Dubin in 2-3 months   Lyda Jester, PA-C 02/14/2019

## 2019-02-15 ENCOUNTER — Telehealth (HOSPITAL_COMMUNITY): Payer: Self-pay

## 2019-02-15 ENCOUNTER — Other Ambulatory Visit: Payer: Self-pay | Admitting: Internal Medicine

## 2019-02-15 DIAGNOSIS — I5032 Chronic diastolic (congestive) heart failure: Secondary | ICD-10-CM

## 2019-02-15 MED ORDER — TORSEMIDE 20 MG PO TABS: ORAL_TABLET | ORAL | 11 refills | Status: DC

## 2019-02-15 MED ORDER — POTASSIUM CHLORIDE CRYS ER 20 MEQ PO TBCR: 40.0000 meq | EXTENDED_RELEASE_TABLET | Freq: Every day | ORAL | 3 refills | Status: DC

## 2019-02-15 NOTE — Telephone Encounter (Signed)
I contacted Douglas [name] to advise him of a medication change per the HF clinic. I instructed him to take torsemide 80mg  in the morning and 60 mg in the evening and take 40 mEq of potassium daily. We walked through how many pills that was per medications and he expressed understanding. I will follow up next week and help him schedule follow up labs.   [name], EMT 02/15/19

## 2019-02-15 NOTE — Telephone Encounter (Signed)
Called zach emt-p to have him make med changes for pt. zach to make med changes. Pt will also make lab appt when visited by zach next week.

## 2019-02-15 NOTE — Telephone Encounter (Signed)
-----   Message from Consuelo Pandy, [place] sent at 02/15/2019  2:29 PM EST ----- Cholesterol controlled. Liver enzymes ok.   Fluid marker is elevated. Increase torsemide to 80/60. Increase Kdur to 40 mEq daily. Repeat BMP and BNP in 7-10 days. Notify paramedicine.

## 2019-02-15 NOTE — Addendum Note (Signed)
Addended by: Lindley Magnus A on: 02/15/2019 04:14 PM   Modules accepted: Orders

## 2019-02-18 ENCOUNTER — Other Ambulatory Visit: Payer: Self-pay | Admitting: Internal Medicine

## 2019-02-19 ENCOUNTER — Telehealth: Payer: Self-pay | Admitting: Physician Assistant

## 2019-02-19 ENCOUNTER — Other Ambulatory Visit (HOSPITAL_COMMUNITY): Payer: Self-pay | Admitting: *Deleted

## 2019-02-19 MED ORDER — APIXABAN 5 MG PO TABS: 5.0000 mg | ORAL_TABLET | Freq: Two times a day (BID) | ORAL | 6 refills | Status: DC

## 2019-02-19 NOTE — Telephone Encounter (Signed)
We will take care of this sylvia. Thank you.

## 2019-02-19 NOTE — Telephone Encounter (Signed)
New Message:     Pt says he wants to cx his appt with you on 02-21-19 and see Dr Burt Knack please.

## 2019-02-20 ENCOUNTER — Telehealth: Payer: Self-pay | Admitting: Physician Assistant

## 2019-02-20 NOTE — Telephone Encounter (Signed)
Pt had OV with me 1/27, but pt wanted to be moved to see Dr. Burt Knack and the end of Feb. This has been arranged.   Angelena Form PA-C  [facility]

## 2019-02-20 NOTE — Telephone Encounter (Signed)
Left message to call back  

## 2019-02-21 ENCOUNTER — Ambulatory Visit: Payer: Medicaid Other | Admitting: Physician Assistant

## 2019-02-21 NOTE — Telephone Encounter (Signed)
Rescheduled patient to 2/1 with Dr. Burt Knack. The patient would like to aim for procedure 2/26. He was grateful for assistance.

## 2019-02-25 ENCOUNTER — Ambulatory Visit: Payer: Medicaid Other | Admitting: Cardiovascular Disease

## 2019-02-26 ENCOUNTER — Telehealth (HOSPITAL_COMMUNITY): Payer: Self-pay

## 2019-02-26 ENCOUNTER — Telehealth (HOSPITAL_COMMUNITY): Payer: Self-pay | Admitting: Vascular Surgery

## 2019-02-26 NOTE — Telephone Encounter (Signed)
Left pt detailed VM giving sleep study appt 2/18 and covid screening 2/16 @ 11:20, PT was asked to call back to office to confirm appt

## 2019-02-26 NOTE — Telephone Encounter (Signed)
I called [name] to schedule an appointment. He stated he has multiple appointments this week so asked if we could meet next week. I will reach out early next week to schedule an appointment.   [name], EMT 02/26/19

## 2019-03-01 ENCOUNTER — Institutional Professional Consult (permissible substitution): Payer: Medicaid Other | Admitting: Cardiovascular Disease

## 2019-03-05 ENCOUNTER — Ambulatory Visit: Payer: Medicaid Other | Admitting: Physician Assistant

## 2019-03-07 ENCOUNTER — Ambulatory Visit: Payer: Medicaid Other | Admitting: Cardiovascular Disease

## 2019-03-12 ENCOUNTER — Other Ambulatory Visit (HOSPITAL_COMMUNITY): Admission: RE | Admit: 2019-03-12 | Payer: Medicaid Other | Source: Ambulatory Visit

## 2019-03-14 ENCOUNTER — Encounter (HOSPITAL_BASED_OUTPATIENT_CLINIC_OR_DEPARTMENT_OTHER): Payer: Medicaid Other | Admitting: Cardiology

## 2019-03-20 ENCOUNTER — Ambulatory Visit: Payer: Medicaid Other | Admitting: Cardiovascular Disease

## 2019-03-22 ENCOUNTER — Other Ambulatory Visit (HOSPITAL_COMMUNITY): Payer: Self-pay

## 2019-03-22 NOTE — Progress Notes (Signed)
Paramedicine Encounter    Patient ID: [name], male    DOB: July 16, 1985, 34 y.o.   MRN: [id number]   Patient Care Team: Medicine, [facility] as PCP - General  Patient Active Problem List   Diagnosis Date Noted  . CVA (cerebral vascular accident) ([place]) 10/14/2018  . Morbid obesity ([place]) 06/17/2015  . Essential hypertension 04/14/2015  . Hyperkalemia 04/14/2015  . OSA (obstructive sleep apnea)   . Difficult intravenous access   . Chronic diastolic CHF (congestive heart failure), NYHA class 3 ([place]) 09/13/2014  . Cor pulmonale, chronic ([place]) 03/19/2014  . OSA treated with BiPAP     Current Outpatient Medications:  .  apixaban (ELIQUIS) 5 MG TABS tablet, Take 1 tablet (5 mg total) by mouth 2 (two) times daily., Disp: 60 tablet, Rfl: 6 .  atorvastatin (LIPITOR) 80 MG tablet, Take 1 tablet (80 mg total) by mouth daily at 6 PM., Disp: 30 tablet, Rfl: 11 .  carvedilol (COREG) 6.25 MG tablet, TAKE 1 AND 1/2 TABLET BY MOUTH TWICE A DAY, Disp: 90 tablet, Rfl: 5 .  hydrALAZINE (APRESOLINE) 25 MG tablet, TAKE 1 TABLET (25 MG TOTAL) BY MOUTH 3 (THREE) TIMES DAILY., Disp: 270 tablet, Rfl: 1 .  OXYGEN, Inhale 2 L/min into the lungs continuous., Disp: , Rfl:  .  potassium chloride SA (KLOR-CON) 20 MEQ tablet, Take 2 tablets (40 mEq total) by mouth daily., Disp: 60 tablet, Rfl: 3 .  PROVENTIL HFA 108 (90 Base) MCG/ACT inhaler, INHALE 1 TO 2 PUFFS BY MOUTH EVERY 6 (SIX) HOURS AS NEEDED FOR WHEEZING OR SHORTNESS OF BREATH (Patient taking differently: Inhale 1-2 puffs into the lungs every 6 (six) hours as needed for wheezing. ), Disp: 6.7 g, Rfl: 0 .  torsemide (DEMADEX) 20 MG tablet, Take 80 mg in am and 60 mg in pm, Disp: 210 tablet, Rfl: 11 Allergies  Allergen Reactions  . Lisinopril Cough      Social History   Socioeconomic History  . Marital status: Single    Spouse name: Not on file  . Number of children: 2  . Years of education: Not on file  . Highest  education level: Not on file  Occupational History  . Occupation: N/A    Employer: NOT EMPLOYED  Tobacco Use  . Smoking status: Former Smoker    Packs/day: 1.50    Years: 3.00    Pack years: 4.50    Types: Cigarettes    Quit date: 10/31/2013    Years since quitting: 5.3  . Smokeless tobacco: Never Used  Substance and Sexual Activity  . Alcohol use: No    Alcohol/week: 1.0 standard drinks    Types: 1 Cans of beer per week  . Drug use: No    Types: Cocaine    Comment: 10/31/2013 "might use cocaine once/month" - Has not used since 2015.   Marland Kitchen Sexual activity: Never  Other Topics Concern  . Not on file  Social History Narrative  . Not on file   Social Determinants of Health   Financial Resource Strain:   . Difficulty of Paying Living Expenses: Not on file  Food Insecurity:   . Worried About Charity fundraiser in the Last Year: Not on file  . Ran Out of Food in the Last Year: Not on file  Transportation Needs:   . Lack of Transportation (Medical): Not on file  . Lack of Transportation (Non-Medical): Not on file  Physical Activity:   . Days of Exercise  per Week: Not on file  . Minutes of Exercise per Session: Not on file  Stress:   . Feeling of Stress : Not on file  Social Connections:   . Frequency of Communication with Friends and Family: Not on file  . Frequency of Social Gatherings with Friends and Family: Not on file  . Attends Religious Services: Not on file  . Active Member of Clubs or Organizations: Not on file  . Attends Archivist Meetings: Not on file  . Marital Status: Not on file  Intimate Partner Violence:   . Fear of Current or Ex-Partner: Not on file  . Emotionally Abused: Not on file  . Physically Abused: Not on file  . Sexually Abused: Not on file    Physical Exam Cardiovascular:     Rate and Rhythm: Normal rate and regular rhythm.     Pulses: Normal pulses.  Pulmonary:     Effort: Pulmonary effort is normal.     Breath sounds: Normal  breath sounds.  Abdominal:     General: There is no distension.  Musculoskeletal:        General: Normal range of motion.     Right lower leg: No edema.     Left lower leg: No edema.  Skin:    General: Skin is warm and dry.     Capillary Refill: Capillary refill takes less than 2 seconds.  Neurological:     Mental Status: He is alert and oriented to person, place, and time.  Psychiatric:        Mood and Affect: Mood normal.         Future Appointments  Date Time Provider [place]  04/18/2019  2:20 PM Larey Dresser, MD [facility] None    BP 122/90 (BP Location: Left Arm, Patient Position: Sitting, Cuff Size: Normal)   Pulse 74   Resp 16   Wt 256 lb 9.6 oz (116.4 kg)   SpO2 95%   BMI 42.70 kg/m   Weight yesterday- Unable to weigh Last visit weight- 266 lb  Mr Oria was seen at home today and rpeorted feeling well. He denied chest pain, SOB, headache, dizziness, orthopnea, fever or cough in the past several weeks. He reported being compliant with his medications and his weight has been stable. I will follow up next week.   [name], EMT 03/22/19  ACTION: Home visit completed Next visit planned for 2 weeks

## 2019-04-05 ENCOUNTER — Ambulatory Visit: Payer: Medicaid Other | Admitting: Cardiovascular Disease

## 2019-04-12 ENCOUNTER — Telehealth (HOSPITAL_COMMUNITY): Payer: Self-pay | Admitting: *Deleted

## 2019-04-12 MED ORDER — SILDENAFIL CITRATE 50 MG PO TABS: 50.0000 mg | ORAL_TABLET | Freq: Every day | ORAL | 1 refills | Status: DC | PRN

## 2019-04-12 NOTE — Telephone Encounter (Signed)
Pt called requesting order for Viagra for his ED, per Dr Aundra Dubin ok to send in 50 mg tabs, use as needed.  RX sent in, pt aware

## 2019-04-17 ENCOUNTER — Telehealth (HOSPITAL_COMMUNITY): Payer: Self-pay

## 2019-04-17 NOTE — Telephone Encounter (Signed)

## 2019-04-18 ENCOUNTER — Ambulatory Visit (HOSPITAL_COMMUNITY)
Admission: RE | Admit: 2019-04-18 | Discharge: 2019-04-18 | Disposition: A | Discharge status: Home / Self Care | Payer: Medicaid Other | Source: Ambulatory Visit | Attending: Cardiology | Admitting: Cardiology

## 2019-04-18 ENCOUNTER — Other Ambulatory Visit (HOSPITAL_COMMUNITY): Payer: Self-pay

## 2019-04-18 ENCOUNTER — Encounter (HOSPITAL_COMMUNITY): Payer: Self-pay | Admitting: Cardiology

## 2019-04-18 ENCOUNTER — Other Ambulatory Visit: Payer: Self-pay

## 2019-04-18 VITALS — BP 98/60 | HR 77 | Wt 257.0 lb

## 2019-04-18 DIAGNOSIS — E7800 Pure hypercholesterolemia, unspecified: Secondary | ICD-10-CM | POA: Diagnosis not present

## 2019-04-18 DIAGNOSIS — Z9981 Dependence on supplemental oxygen: Secondary | ICD-10-CM | POA: Diagnosis not present

## 2019-04-18 DIAGNOSIS — I2781 Cor pulmonale (chronic): Secondary | ICD-10-CM

## 2019-04-18 DIAGNOSIS — Q2112 Patent foramen ovale: Secondary | ICD-10-CM

## 2019-04-18 DIAGNOSIS — Z6841 Body Mass Index (BMI) 40.0 and over, adult: Secondary | ICD-10-CM | POA: Diagnosis not present

## 2019-04-18 DIAGNOSIS — E662 Morbid (severe) obesity with alveolar hypoventilation: Secondary | ICD-10-CM | POA: Insufficient documentation

## 2019-04-18 DIAGNOSIS — I5032 Chronic diastolic (congestive) heart failure: Secondary | ICD-10-CM

## 2019-04-18 DIAGNOSIS — Z7901 Long term (current) use of anticoagulants: Secondary | ICD-10-CM | POA: Insufficient documentation

## 2019-04-18 DIAGNOSIS — M1990 Unspecified osteoarthritis, unspecified site: Secondary | ICD-10-CM | POA: Insufficient documentation

## 2019-04-18 DIAGNOSIS — Q211 Atrial septal defect: Secondary | ICD-10-CM | POA: Insufficient documentation

## 2019-04-18 DIAGNOSIS — Z8249 Family history of ischemic heart disease and other diseases of the circulatory system: Secondary | ICD-10-CM | POA: Insufficient documentation

## 2019-04-18 DIAGNOSIS — Z8673 Personal history of transient ischemic attack (TIA), and cerebral infarction without residual deficits: Secondary | ICD-10-CM | POA: Diagnosis not present

## 2019-04-18 DIAGNOSIS — Z87891 Personal history of nicotine dependence: Secondary | ICD-10-CM | POA: Insufficient documentation

## 2019-04-18 DIAGNOSIS — I110 Hypertensive heart disease with heart failure: Secondary | ICD-10-CM | POA: Insufficient documentation

## 2019-04-18 DIAGNOSIS — Z79899 Other long term (current) drug therapy: Secondary | ICD-10-CM | POA: Diagnosis not present

## 2019-04-18 DIAGNOSIS — I2510 Atherosclerotic heart disease of native coronary artery without angina pectoris: Secondary | ICD-10-CM | POA: Insufficient documentation

## 2019-04-18 DIAGNOSIS — G4733 Obstructive sleep apnea (adult) (pediatric): Secondary | ICD-10-CM | POA: Diagnosis not present

## 2019-04-18 LAB — BASIC METABOLIC PANEL
Anion gap: 14 (ref 5–15)
BUN: 14 mg/dL (ref 6–20)
CO2: 40 mmol/L — ABNORMAL HIGH (ref 22–32)
Calcium: 8.7 mg/dL — ABNORMAL LOW (ref 8.9–10.3)
Chloride: 85 mmol/L — ABNORMAL LOW (ref 98–111)
Creatinine, Ser: 1.41 mg/dL — ABNORMAL HIGH (ref 0.61–1.24)
GFR calc Af Amer: >60 mL/min (ref >60)
GFR calc non Af Amer: >60 mL/min (ref >60)
Glucose, Bld: 112 mg/dL — ABNORMAL HIGH (ref 70–99)
Potassium: 5.4 mmol/L — ABNORMAL HIGH (ref 3.5–5.1)
Sodium: 139 mmol/L (ref 135–145)

## 2019-04-18 MED ORDER — TORSEMIDE 20 MG PO TABS: 80.0000 mg | ORAL_TABLET | Freq: Two times a day (BID) | ORAL | 2 refills | Status: DC

## 2019-04-18 NOTE — Progress Notes (Signed)
Paramedicine Encounter   Patient ID: Douglas [name] , male,   DOB: 1985-12-30,33 y.o.,  MRN: [id number]  [name] was seen in the HF clinic today with Dr Aundra Dubin. Per Dr Aundra Dubin, he is retaining some fluid so he is to increase torsemide to 80 mg BID and recheck labs in 7-10 days. I will follow up on Tuesday at 09:30. Additionally we need to make sure he follows up on his surgical consult for the PFO. I will check on this referral on Tuesday as well.   [name], EMT 04/18/2019   ACTION: Next visit planned for Tuesday

## 2019-04-18 NOTE — Patient Instructions (Addendum)
Labs done today. We will contact you only if your labs are abnormal.  INCREASE Torsemide 80(4 tablets) by mouth twice daily.  No other medication changes were made. Please continue all other medications as prescribed.  Your physician recommends that you schedule a follow-up appointment in: 10 days for a lab only appointment and in 2 months with the app clinic(here at our office).  You have been referred to Structural heart to see Dr. Sherren Mocha at [address] [place] [address] [place]. His office will contact you to schedule an appointment.   At the [facility], you and your health needs are our priority. As part of our continuing mission to provide you with exceptional heart care, we have created designated Provider Care Teams. These Care Teams include your primary Cardiologist (physician) and Advanced Practice Providers (APPs- Physician Assistants and Nurse Practitioners) who all work together to provide you with the care you need, when you need it.   You may see any of the following providers on your designated Care Team at your next follow up: Marland Kitchen Dr Glori Bickers . Dr [name] . Darrick Grinder, NP . Lyda Jester, PA . Audry Riles, PharmD   Please be sure to bring in all your medications bottles to every appointment.

## 2019-04-19 ENCOUNTER — Telehealth (HOSPITAL_COMMUNITY): Payer: Self-pay | Admitting: *Deleted

## 2019-04-19 MED ORDER — POTASSIUM CHLORIDE CRYS ER 20 MEQ PO TBCR: 20.0000 meq | EXTENDED_RELEASE_TABLET | Freq: Every day | ORAL | 3 refills | Status: DC

## 2019-04-19 NOTE — Telephone Encounter (Signed)
Douglas [name], [place]  04/19/2019 9:23 AM EDT    Spoke with pt he is aware and agreeable with plan    Larey Dresser, MD  04/19/2019 1:09 AM EDT    Stop KCl supplement x 2 days then decrease to 20 mEq daily. BMET 1 week.

## 2019-04-19 NOTE — Telephone Encounter (Signed)
-----   Message from Larey Dresser, MD sent at 04/19/2019  1:09 AM EDT ----- Stop KCl supplement x 2 days then decrease to 20 mEq daily.  BMET 1 week.

## 2019-04-19 NOTE — Progress Notes (Signed)
Patient ID: Douglas Madura., male   DOB: 1985/10/17, 34 y.o.   MRN: [id number]    Advanced Heart Failure Clinic Note   PCP: [facility], [facility] Primary Cardiologist: Dr Aundra Dubin  Pulmonary: Dr Halford Chessman    HPI: [name] is a 34 y.o. with OHS/OSA, morbid obesity, HTN, diastolic CHF with prominent RV failure.   Admitted in August 2016  to [facility] with volume overload. Diuresed with lasix drip and diamox. Transitioned to lasix 80 mg twice a day. Discharge weight 257 pounds.   Admit to [facility] 3/21 through 04/28/15 with respiratory distress. Required short term intubation. Apparently had not been wearing BiPap. Discharge weight was 259 pounds.   Admitted with AMS in the setting of acute stroke in 9/20. Suspect that he had a cardio-embolic CVA. On TEE,  swirling with spontaneous contrast noted in the severely dilated RA. There was a moderate-sized PFO with bidirectional shunting. Strongly suspect this was the source of his CVA. Atrial fibrillation has not been seen, no LA appendage thrombus.  Coronary CTA in 9/20 showed nonobstructive mild CAD.  Discharged on Eliquis.  He saw Dr. Burt Knack for PFO closure but has not yet had the procedure done.    He returns today for followup of OHS/OSA, diastolic CHF with RV failure, and CVA with PFO.  Right-sided weakness and dysarthria with stroke are almost totally resolved.  He is using home oxygen but does not have CPAP.  He is trying to get a sleep study but has to pay a lot out of pocket for it and does not have the money right now. Weight is down 9 lbs.  He can walk on flat ground without much dyspnea as long as he wears his oxygen.  No orthopnea/PND.  No chest pain.   - ECHO 03/20/2014 EF 60-65% - ECHO 8/18: EF 55-60%, grade II diastolic dydsfunction, D-shaped septum suggesting RV pressure/volume overload, severe RV dilation with mild-moderately decreased RV systolic function, unable to estimate PA pressure.  - ECHO 5/20: EF 55-60%, moderate RV  dilation with mildly decreased systolic function, unable to estimate PASP, D-shaped interventricular septum.  - TEE 9/20: EF 55%, severely dilated RV with moderately decreased systolic function, D-shaped septum, large PFO with bidirectional shunting, severe RAE with spontaneous contrast in RA.  - Coronary CTA (9/20): Nonobstructive disease.   Review of systems complete and found to be negative unless listed in HPI.   SH:  Social History   Socioeconomic History  . Marital status: Single    Spouse name: Not on file  . Number of children: 2  . Years of education: Not on file  . Highest education level: Not on file  Occupational History  . Occupation: N/A    Employer: NOT EMPLOYED  Tobacco Use  . Smoking status: Former Smoker    Packs/day: 1.50    Years: 3.00    Pack years: 4.50    Types: Cigarettes    Quit date: 10/31/2013    Years since quitting: 5.4  . Smokeless tobacco: Never Used  Substance and Sexual Activity  . Alcohol use: No    Alcohol/week: 1.0 standard drinks    Types: 1 Cans of beer per week  . Drug use: No    Types: Cocaine    Comment: 10/31/2013 "might use cocaine once/month" - Has not used since 2015.   Marland Kitchen Sexual activity: Never  Other Topics Concern  . Not on file  Social History Narrative  . Not on file   Social Determinants  of Health   Financial Resource Strain:   . Difficulty of Paying Living Expenses:   Food Insecurity:   . Worried About Charity fundraiser in the Last Year:   . Arboriculturist in the Last Year:   Transportation Needs:   . Film/video editor (Medical):   Marland Kitchen Lack of Transportation (Non-Medical):   Physical Activity:   . Days of Exercise per Week:   . Minutes of Exercise per Session:   Stress:   . Feeling of Stress :   Social Connections:   . Frequency of Communication with Friends and Family:   . Frequency of Social Gatherings with Friends and Family:   . Attends Religious Services:   . Active Member of Clubs or Organizations:    . Attends Archivist Meetings:   Marland Kitchen Marital Status:   Intimate Partner Violence:   . Fear of Current or Ex-Partner:   . Emotionally Abused:   Marland Kitchen Physically Abused:   . Sexually Abused:     FH:  Family History  Problem Relation Age of Onset  . Heart disease Mother        Pacemaker  . Hypertension Father   . Cancer Paternal Grandfather   . Heart attack Neg Hx   . Stroke Neg Hx     Past Medical History:  Diagnosis Date  . Arthritis    "hands, feet" (10/31/2013)  . CHF (congestive heart failure) ([place])   . Chronic bronchitis ([place])   . High cholesterol   . Hypertension   . Obesity   . OSA on CPAP   . Pneumonia 09/2011  . Substance abuse ([place]) 8/31/213   cociane "first time use"  . Tobacco abuse     Current Outpatient Medications  Medication Sig Dispense Refill  . albuterol (PROVENTIL HFA) 108 (90 Base) MCG/ACT inhaler Inhale 1-2 puffs into the lungs every 6 (six) hours as needed for wheezing or shortness of breath.    Marland Kitchen apixaban (ELIQUIS) 5 MG TABS tablet Take 1 tablet (5 mg total) by mouth 2 (two) times daily. 60 tablet 6  . atorvastatin (LIPITOR) 80 MG tablet Take 1 tablet (80 mg total) by mouth daily at 6 PM. 30 tablet 11  . carvedilol (COREG) 6.25 MG tablet TAKE 1 AND 1/2 TABLET BY MOUTH TWICE A DAY 90 tablet 5  . hydrALAZINE (APRESOLINE) 25 MG tablet TAKE 1 TABLET (25 MG TOTAL) BY MOUTH 3 (THREE) TIMES DAILY. 270 tablet 1  . latanoprost (XALATAN) 0.005 % ophthalmic solution 1 drop at bedtime.    Marland Kitchen LUMIGAN 0.01 % SOLN 1 drop at bedtime.    . OXYGEN Inhale 2 L/min into the lungs continuous.    . potassium chloride SA (KLOR-CON) 20 MEQ tablet Take 2 tablets (40 mEq total) by mouth daily. 60 tablet 3  . sildenafil (VIAGRA) 50 MG tablet Take 1 tablet (50 mg total) by mouth daily as needed for erectile dysfunction. 10 tablet 1  . torsemide (DEMADEX) 20 MG tablet Take 4 tablets (80 mg total) by mouth 2 (two) times daily. 240 tablet 2   No current  facility-administered medications for this encounter.    Vitals:   04/18/19 1439  BP: 98/60  Pulse: 77  SpO2: 96%  Weight: 116.6 kg (257 lb)   Wt Readings from Last 3 Encounters:  04/18/19 116.6 kg (257 lb)  03/22/19 116.4 kg (256 lb 9.6 oz)  02/14/19 121 kg (266 lb 12.8 oz)    Vitals:   04/18/19 1439  BP: 98/60  Pulse: 77  SpO2: 96%    PHYSICAL EXAM: General: NAD Neck: JVP 9-10 cm, no thyromegaly or thyroid nodule.  Lungs: Clear to auscultation bilaterally with normal respiratory effort. CV: Nondisplaced PMI.  Heart regular S1/S2, no S3/S4, no murmur.  1+ edema to knees bilaterally.  No carotid bruit.  Normal pedal pulses.  Abdomen: Soft, nontender, no hepatosplenomegaly, no distention.  Skin: Intact without lesions or rashes.  Neurologic: Alert and oriented x 3.  Psych: Normal affect. Extremities: No clubbing or cyanosis.  HEENT: Normal.   ASSESSMENT & PLAN:  1.Chronic diastolic CHF with prominent RV failure: TEE in 9/20 showed EF 55%, severe RV dilation with moderately decreased systolic function, D-shaped septum.  RV failure is in the setting of OSA/OHS.  RVH on ECG.  NYHA class II-III symptoms, stable.  He does appear to have some volume excess on exam.    - Increase torsemide to 80 mg bid, BMET today and in 10 days.  - Continue Coreg 9.375 mg BID 2. OHS/OSA:  CPAP was removed because he stopped using it. Needs another sleep study but does not have the money for it right now. He is using home oxygen at all times.  - Continue home oxygen.  - Trying to get together the funds to get sleep study.  3. HTN:  Controlled.  - Continue Coreg and hydralazine.  4. CAD: Nonobstructive on coronary CTA in 9/20.  - Continue statin.  5. Stroke: L MCA stroke secondary to PFO with paradoxical emboli. PFO with bidirectional flow was found on TEE, suggesting stroke was related to paradoxical emboli.  DVT studies negative.  Atrial fibrillation not visualized.  - Eliquis 5 mg twice  daily 6. PFO: He has seen Dr. Burt Knack, needs to get set up for PFO closure (will contact structural heart team).   [name], MD 04/19/2019

## 2019-04-23 ENCOUNTER — Telehealth (HOSPITAL_COMMUNITY): Payer: Self-pay

## 2019-04-23 NOTE — Telephone Encounter (Signed)
I called Douglas [name] to see when he would be available to meet this week. I had been at his apartment earlier this morning for our scheduled appointment and knocked without answer. He also did not answer my phone calls at that time so I was trying again this afternoon to try and reschedule. He advised he was sleeping this morning and did not hear me knock or call but said he would be able to meet later in the week. I advised I could come on Thursday at 10:00 and he was agreeable.   [name], EMT 04/23/19

## 2019-04-25 ENCOUNTER — Telehealth (HOSPITAL_COMMUNITY): Payer: Self-pay

## 2019-04-25 NOTE — Telephone Encounter (Signed)
I called Mr Seppala while at his door. We had an appointment scheduled for this morning after he missed our appointment on Monday. He did not answer the phone nor the door. I will reach out next week.   [name], EMT 04/25/19

## 2019-04-30 ENCOUNTER — Telehealth (HOSPITAL_COMMUNITY): Payer: Self-pay

## 2019-04-30 NOTE — Telephone Encounter (Signed)
I called Douglas [name] today to schedule an appointment. He has been a no-show to two appointment last week and stated he did not get my messages. He stated he has a lab appointment on Thursday of this week so he agreed to meet me at 12:00 on Thursday.   [name], EMT 04/30/19

## 2019-05-02 ENCOUNTER — Other Ambulatory Visit (HOSPITAL_COMMUNITY): Payer: Self-pay

## 2019-05-02 ENCOUNTER — Telehealth (HOSPITAL_COMMUNITY): Payer: Self-pay | Admitting: Licensed Clinical Social Worker

## 2019-05-02 ENCOUNTER — Other Ambulatory Visit (HOSPITAL_COMMUNITY): Payer: Medicaid Other

## 2019-05-02 NOTE — Progress Notes (Signed)
Paramedicine Encounter    Patient ID: [name], male    DOB: 01/01/1986, 34 y.o.   MRN: [id number]   Patient Care Team: [facility], [facility] as PCP - General  Patient Active Problem List   Diagnosis Date Noted  . CVA (cerebral vascular accident) ([place]) 10/14/2018  . Morbid obesity ([place]) 06/17/2015  . Essential hypertension 04/14/2015  . Hyperkalemia 04/14/2015  . OSA (obstructive sleep apnea)   . Difficult intravenous access   . Chronic diastolic CHF (congestive heart failure), NYHA class 3 ([place]) 09/13/2014  . Cor pulmonale, chronic ([place]) 03/19/2014  . OSA treated with BiPAP     Current Outpatient Medications:  .  albuterol (PROVENTIL HFA) 108 (90 Base) MCG/ACT inhaler, Inhale 1-2 puffs into the lungs every 6 (six) hours as needed for wheezing or shortness of breath., Disp: , Rfl:  .  apixaban (ELIQUIS) 5 MG TABS tablet, Take 1 tablet (5 mg total) by mouth 2 (two) times daily., Disp: 60 tablet, Rfl: 6 .  atorvastatin (LIPITOR) 80 MG tablet, Take 1 tablet (80 mg total) by mouth daily at 6 PM., Disp: 30 tablet, Rfl: 11 .  carvedilol (COREG) 6.25 MG tablet, TAKE 1 AND 1/2 TABLET BY MOUTH TWICE A DAY, Disp: 90 tablet, Rfl: 5 .  hydrALAZINE (APRESOLINE) 25 MG tablet, TAKE 1 TABLET (25 MG TOTAL) BY MOUTH 3 (THREE) TIMES DAILY., Disp: 270 tablet, Rfl: 1 .  latanoprost (XALATAN) 0.005 % ophthalmic solution, 1 drop at bedtime., Disp: , Rfl:  .  LUMIGAN 0.01 % SOLN, 1 drop at bedtime., Disp: , Rfl:  .  OXYGEN, Inhale 2 L/min into the lungs continuous., Disp: , Rfl:  .  potassium chloride SA (KLOR-CON) 20 MEQ tablet, Take 1 tablet (20 mEq total) by mouth daily., Disp: 60 tablet, Rfl: 3 .  sildenafil (VIAGRA) 50 MG tablet, Take 1 tablet (50 mg total) by mouth daily as needed for erectile dysfunction., Disp: 10 tablet, Rfl: 1 .  torsemide (DEMADEX) 20 MG tablet, Take 4 tablets (80 mg total) by mouth 2 (two) times daily., Disp: 240 tablet, Rfl: 2 Allergies  Allergen  Reactions  . Lisinopril Cough      Social History   Socioeconomic History  . Marital status: Single    Spouse name: Not on file  . Number of children: 2  . Years of education: Not on file  . Highest education level: Not on file  Occupational History  . Occupation: N/A    Employer: NOT EMPLOYED  Tobacco Use  . Smoking status: Former Smoker    Packs/day: 1.50    Years: 3.00    Pack years: 4.50    Types: Cigarettes    Quit date: 10/31/2013    Years since quitting: 5.5  . Smokeless tobacco: Never Used  Substance and Sexual Activity  . Alcohol use: No    Alcohol/week: 1.0 standard drinks    Types: 1 Cans of beer per week  . Drug use: No    Types: Cocaine    Comment: 10/31/2013 "might use cocaine once/month" - Has not used since 2015.   Marland Kitchen Sexual activity: Never  Other Topics Concern  . Not on file  Social History Narrative  . Not on file   Social Determinants of Health   Financial Resource Strain:   . Difficulty of Paying Living Expenses:   Food Insecurity:   . Worried About Charity fundraiser in the Last Year:   . [place] in the Last  Year:   Transportation Needs:   . Film/video editor (Medical):   Marland Kitchen Lack of Transportation (Non-Medical):   Physical Activity:   . Days of Exercise per Week:   . Minutes of Exercise per Session:   Stress:   . Feeling of Stress :   Social Connections:   . Frequency of Communication with Friends and Family:   . Frequency of Social Gatherings with Friends and Family:   . Attends Religious Services:   . Active Member of Clubs or Organizations:   . Attends Archivist Meetings:   Marland Kitchen Marital Status:   Intimate Partner Violence:   . Fear of Current or Ex-Partner:   . Emotionally Abused:   Marland Kitchen Physically Abused:   . Sexually Abused:     Physical Exam Cardiovascular:     Rate and Rhythm: Normal rate and regular rhythm.     Pulses: Normal pulses.  Pulmonary:     Effort: Pulmonary effort is normal.     Breath  sounds: Normal breath sounds.  Musculoskeletal:        General: Normal range of motion.     Right lower leg: No edema.     Left lower leg: No edema.  Skin:    General: Skin is warm and dry.     Capillary Refill: Capillary refill takes less than 2 seconds.  Neurological:     General: No focal deficit present.     Mental Status: He is alert and oriented to person, place, and time.  Psychiatric:        Mood and Affect: Mood normal.         Future Appointments  Date Time Provider [place]  05/03/2019  2:30 PM [facility] LAB [facility] None  05/06/2019  3:40 PM Sherren Mocha, MD [facility]  06/20/2019  2:00 PM [facility] PA/NP [facility] None    BP (!) 129/95 (BP Location: Left Arm, Patient Position: Sitting, Cuff Size: Normal)   Pulse 80   Resp 18   Wt 258 lb 6.4 oz (117.2 kg)   SpO2 93%   BMI 43.00 kg/m   Weight yesterday- did not weigh Last visit weight- 257 lb  Mr Luft was seen at home today and reported feeling generally well. He denied chest pain, SOB, headache, dizziness, orthopnea, fever or cough since our last visit. He reported being compliant with his medications but had not taken them until my arrival today so his blood pressure was slightly elevated. His medications were verified and he is not in need of anything at this time. He missed his lab appointment today but I was able to get him rescheduled for tomorrow and [name] Sours, LCSW, arranged a ride. I will follow up next week.   [name], EMT 05/02/19  ACTION: Home visit completed Next visit planned for 1 week

## 2019-05-02 NOTE — Telephone Encounter (Signed)
CSW informed by [employer] that pt no-showed to his lab appt this morning- reported that the taxi didn't come but didn't call us at that time to inform.  CSW had clinic staff reschedule pt for lab tomorrow and put in taxi request- pt updated regarding new appt time  CSW will continue to follow and assist as needed  Douglas [name], [place] Worker [facility] Desk#: [phone] Cell#: [phone]

## 2019-05-03 ENCOUNTER — Ambulatory Visit (HOSPITAL_COMMUNITY)
Admission: RE | Admit: 2019-05-03 | Discharge: 2019-05-03 | Disposition: A | Discharge status: Home / Self Care | Payer: Medicaid Other | Source: Ambulatory Visit | Attending: Cardiology | Admitting: Cardiology

## 2019-05-03 ENCOUNTER — Other Ambulatory Visit: Payer: Self-pay

## 2019-05-03 DIAGNOSIS — I5032 Chronic diastolic (congestive) heart failure: Secondary | ICD-10-CM

## 2019-05-03 LAB — BASIC METABOLIC PANEL
Anion gap: 11 (ref 5–15)
BUN: 19 mg/dL (ref 6–20)
CO2: 39 mmol/L — ABNORMAL HIGH (ref 22–32)
Calcium: 9.1 mg/dL (ref 8.9–10.3)
Chloride: 91 mmol/L — ABNORMAL LOW (ref 98–111)
Creatinine, Ser: 1.2 mg/dL (ref 0.61–1.24)
GFR calc Af Amer: >60 mL/min (ref >60)
GFR calc non Af Amer: >60 mL/min (ref >60)
Glucose, Bld: 55 mg/dL — ABNORMAL LOW (ref 70–99)
Potassium: 4.1 mmol/L (ref 3.5–5.1)
Sodium: 141 mmol/L (ref 135–145)

## 2019-05-06 ENCOUNTER — Ambulatory Visit (INDEPENDENT_AMBULATORY_CARE_PROVIDER_SITE_OTHER): Payer: Medicaid Other | Admitting: Cardiovascular Disease

## 2019-05-06 ENCOUNTER — Encounter: Payer: Self-pay | Admitting: Cardiovascular Disease

## 2019-05-06 ENCOUNTER — Other Ambulatory Visit: Payer: Self-pay

## 2019-05-06 VITALS — BP 124/92 | HR 78 | Ht 65.0 in | Wt 264.0 lb

## 2019-05-06 DIAGNOSIS — Q211 Atrial septal defect: Secondary | ICD-10-CM

## 2019-05-06 DIAGNOSIS — Q2112 Patent foramen ovale: Secondary | ICD-10-CM

## 2019-05-06 MED ORDER — ASPIRIN EC 81 MG PO TBEC: 81.0000 mg | DELAYED_RELEASE_TABLET | Freq: Every day | ORAL | 3 refills | Status: DC

## 2019-05-06 NOTE — H&P (View-Only) (Signed)
Cardiology Office Note:    Date:  05/06/2019   ID:  Douglas Madura., DOB July 16, 1985, MRN [id number]  PCP:  [facility]  Cardiologist:  No primary care provider on file.  Electrophysiologist:  None   Referring MD: [facility]   Chief Complaint  Patient presents with  . pfo follow-up    History of Present Illness:    Douglas Mazzanti. is a 34 y.o. male with a hx of PFO and embolic stroke, presenting for follow-up evaluation. The patient was last seen in the office on October 26th, 2020. At that time we discussed transcatheter closure of his PFO and he preferred to wait until after the Holidays. He has subsequently missed several follow-up appointments but presents today with his aunt to discuss PFO closure.   He reports no new neurologic symptoms. He was initially seen during a hospitalization in September 2020 in consultation for PFO management. He presented with slurred speech, right facial droop, and right-sided hemiparesis. CT and MRI studies demonstrated evidence of an acute left MCA infarct, a chronic left PICA and right parietal-occipital infarcts. He was not a candidate for TPA or acute stroke intervention because of symptom duration and size of infarcted area. His stroke work-up demonstrated normal bilateral venous duplex scan, no significant carotid stenosis, and evidence of RV dilatation/dysfunction with a moderate PFO and spontaneous contrast in the right atrium. He was started on apixaban and returns today for further discussion regarding transcatheter PFO closure in an attempt to reduce his risk of recurrent stroke.   He has mild DOE, otherwise no complaints. denies CP, lightheadedness, palpitations, or syncope. No bleeding problems on apixaban. No other complaints.   Past Medical History:  Diagnosis Date  . Arthritis    "hands, feet" (10/31/2013)  . CHF (congestive heart failure) ([place])   . Chronic bronchitis ([place])   .  High cholesterol   . Hypertension   . Obesity   . OSA on CPAP   . Pneumonia 09/2011  . Substance abuse ([place]) 8/31/213   cociane "first time use"  . Tobacco abuse     Past Surgical History:  Procedure Laterality Date  . BUBBLE STUDY  10/15/2018   Procedure: BUBBLE STUDY;  Surgeon: Larey Dresser, MD;  Location: [facility] ENDOSCOPY;  Service: Cardiovascular;;  . NO PAST SURGERIES    . TEE WITHOUT CARDIOVERSION N/A 10/15/2018   Procedure: TRANSESOPHAGEAL ECHOCARDIOGRAM (TEE);  Surgeon: Larey Dresser, MD;  Location: [facility] ENDOSCOPY;  Service: Cardiovascular;  Laterality: N/A;  . TUBES IN EARS      Current Medications: No outpatient medications have been marked as taking for the 05/06/19 encounter (Office Visit) with Douglas Mocha, MD.     Allergies:   Lisinopril   Social History   Socioeconomic History  . Marital status: Single    Spouse name: Not on file  . Number of children: 2  . Years of education: Not on file  . Highest education level: Not on file  Occupational History  . Occupation: N/A    Employer: NOT EMPLOYED  Tobacco Use  . Smoking status: Former Smoker    Packs/day: 1.50    Years: 3.00    Pack years: 4.50    Types: Cigarettes    Quit date: 10/31/2013    Years since quitting: 5.5  . Smokeless tobacco: Never Used  Substance and Sexual Activity  . Alcohol use: No    Alcohol/week: 1.0 standard drinks    Types: 1 Cans  of beer per week  . Drug use: No    Types: Cocaine    Comment: 10/31/2013 "might use cocaine once/month" - Has not used since 2015.   Marland Kitchen Sexual activity: Never  Other Topics Concern  . Not on file  Social History Narrative  . Not on file   Social Determinants of Health   Financial Resource Strain:   . Difficulty of Paying Living Expenses:   Food Insecurity:   . Worried About Charity fundraiser in the Last Year:   . Arboriculturist in the Last Year:   Transportation Needs:   . Film/video editor (Medical):   Marland Kitchen Lack of Transportation  (Non-Medical):   Physical Activity:   . Days of Exercise per Week:   . Minutes of Exercise per Session:   Stress:   . Feeling of Stress :   Social Connections:   . Frequency of Communication with Friends and Family:   . Frequency of Social Gatherings with Friends and Family:   . Attends Religious Services:   . Active Member of Clubs or Organizations:   . Attends Archivist Meetings:   Marland Kitchen Marital Status:      Family History: The patient's family history includes Cancer in his paternal grandfather; Heart disease in his mother; Hypertension in his father. There is no history of Heart attack or Stroke.  ROS:   Please see the history of present illness.    All other systems reviewed and are negative.  EKGs/Labs/Other Studies Reviewed:    The following studies were reviewed today: TEE 14-Nov-2018: 1. Left ventricular ejection fraction, by visual estimation, is 55%. The  left ventricle has normal function. Normal left ventricular size. There is  moderately increased left ventricular hypertrophy.  2. Global right ventricle has moderately reduced systolic function.The  right ventricular size is severely enlarged. Mildly increased right  ventricular wall thickness. D-shaped septum suggestive of RV  pressure/volume overload.  3. Trivial pericardial effusion is present.  4. The tricuspid valve is normal in structure. Tricuspid valve  regurgitation is mild. Peak RV-RA gradient 38 mmHg.  5. Mild left atrial enlargement. There turbulent flow into the left  atrium adjacent to the right upper pulmonary vein. The flow is continous  by CW doppler but does not appear to be an ASD with right to left shunting  (does not originate from the RA). It  possibly represents ?a compressed pulmonary vein. Needs further  evaluation.  6. Evidence of atrial level shunting detected by color flow Doppler.  Patient has a moderate PFO with bidirectional flow. Positive bubble study.  7. Severe  right atrial enlargement. There is spontaneous contrast noted  in the RA suggesive of low flow state and risk of thrombosis.  8. The aortic root was not well visualized.  9. The aortic valve is tricuspid Aortic valve regurgitation was not  visualized by color flow Doppler. Structurally normal aortic valve, with  no evidence of sclerosis or stenosis.  10. The mitral valve is normal in structure. Trace mitral valve  regurgitation. No evidence of mitral stenosis.  11. The pulmonic valve was normal in structure. Pulmonic valve  regurgitation is not visualized by color flow Doppler.   EKG:  EKG is ordered today.  The ekg ordered today demonstrates NSR 78 bpm, RAE, RVH  Recent Labs: 11/14/18: TSH 0.419 02/14/2019: ALT 16; B Natriuretic Peptide 437.7; Hemoglobin 17.8; Platelets 151 05/03/2019: BUN 19; Creatinine, Ser 1.20; Potassium 4.1; Sodium 141  Recent Lipid Panel  Component Value Date/Time   CHOL 115 02/14/2019 1524   TRIG 49 02/14/2019 1524   HDL 46 02/14/2019 1524   CHOLHDL 2.5 02/14/2019 1524   VLDL 10 02/14/2019 1524   LDLCALC 59 02/14/2019 1524    Physical Exam:    VS:  BP (!) 124/92   Pulse 78   Ht 5\' 5"  (1.651 m)   Wt 264 lb (119.7 kg)   SpO2 90%   BMI 43.93 kg/m     Wt Readings from Last 3 Encounters:  05/06/19 264 lb (119.7 kg)  05/02/19 258 lb 6.4 oz (117.2 kg)  04/18/19 257 lb (116.6 kg)     GEN: Obese AAM in no acute distress HEENT: Normal NECK: No JVD; No carotid bruits LYMPHATICS: No lymphadenopathy CARDIAC: RRR, no murmurs, rubs, gallops RESPIRATORY:  Clear to auscultation without rales, wheezing or rhonchi  ABDOMEN: Soft, non-tender, non-distended MUSCULOSKELETAL:  No edema; No deformity  SKIN: Warm and dry NEUROLOGIC:  Alert and oriented x 3 PSYCHIATRIC:  Normal affect   ASSESSMENT:    1. PFO (patent foramen ovale)    PLAN:    In order of problems listed above:  1. The patient has a PFO at a young age with embolic stroke. He is at high  risk of recurrence with chronic right heart failure and elevated right heart pressures which increase R--->L intracardiac shunting. We again discussed transcatheter PFO closure versus long term anticoagulation. He would like to proceed with PFO closure. I reviewed the risks, indications, and alternatives to transcatheter closure with the patient. Specific risks include bleeding, infection, device embolization, stroke, cardiac perforation, tamponade, arrhythmia, MI, and late device erosion. He understands these serious risks occur at low incidence of < 1%.    Medication Adjustments/Labs and Tests Ordered: Current medicines are reviewed at length with the patient today.  Concerns regarding medicines are outlined above.  Orders Placed This Encounter  Procedures  . CBC with Differential/Platelet  . EKG 12-Lead   Meds ordered this encounter  Medications  . aspirin EC 81 MG tablet    Sig: Take 1 tablet (81 mg total) by mouth daily.    Dispense:  90 tablet    Refill:  3    Patient Instructions  COVID SCREENING INFORMATION (4/20): You are scheduled for your COVID screening on 05/14/2019 at 12:00PM. [place] [facility] (old [facility]) [address] Stay in the RIGHT lane and proceed under the brick awning and tell them you are there for pre-procedure testing Do NOT bring any pets with you to the testing site You must go home and quarantine between testing and procedure.   PFO CLOSURE INSTRUCTIONS (4/22): You are scheduled for a PFO CLOSURE on Thursday, April 22 with Dr. Sherren [name].  1. Please arrive at the [facility] (Main Entrance A) at [facility]: [address] [place], [place] [address] at 10:00AM (This time is two hours before your procedure to ensure your preparation). Free valet parking service is available. You are allowed ONE visitor in the waiting room during your procedure. Both you and your visitor must wear masks. Special note: Every effort is  made to have your procedure done on time. Please understand that emergencies sometimes delay scheduled procedures.  2. Diet: Do not eat solid foods after midnight.  You may have clear liquids until 5am upon the day of the procedure.  3. Labs: Today! CBC  4. Medication instructions in preparation for your procedure:  1) Take your last dose of  ELIQUIS 4/21 in the morning  2) START ASPRIN 81 mg now (and make sure you take the day of your procedure)  3) HOLD TORSEMIDE the morning of your procedure  4) HOLD HYDRALAZINE the morning of your procedure  5) You may take your other medications as directed with sips of water  5. Plan for one night stay--bring personal belongings. 6. Bring a current list of your medications and current insurance cards. 7. You MUST have a responsible person to drive you home. 8. Someone MUST be with you the first 24 hours after you arrive home or your discharge will be delayed. 9. Please wear clothes that are easy to get on and off and wear slip-on shoes.  Thank you for letting us take care of you!    Signed, Douglas Mocha, MD  05/06/2019 4:52 PM    [place]

## 2019-05-06 NOTE — Progress Notes (Signed)
Cardiology Office Note:    Date:  05/06/2019   ID:  Douglas [name]., DOB 19-Aug-1985, MRN [id number]  PCP:  [facility]  Cardiologist:  No primary care provider on file.  Electrophysiologist:  None   Referring MD: [facility]   Chief Complaint  Patient presents with  . pfo follow-up    History of Present Illness:    Douglas [name]. is a 34 y.o. male with a hx of PFO and embolic stroke, presenting for follow-up evaluation. The patient was last seen in the office on October 26th, 2020. At that time we discussed transcatheter closure of his PFO and he preferred to wait until after the Holidays. He has subsequently missed several follow-up appointments but presents today with his aunt to discuss PFO closure.   He reports no new neurologic symptoms. He was initially seen during a hospitalization in September 2020 in consultation for PFO management. He presented with slurred speech, right facial droop, and right-sided hemiparesis. CT and MRI studies demonstrated evidence of an acute left MCA infarct, a chronic left PICA and right parietal-occipital infarcts. He was not a candidate for TPA or acute stroke intervention because of symptom duration and size of infarcted area. His stroke work-up demonstrated normal bilateral venous duplex scan, no significant carotid stenosis, and evidence of RV dilatation/dysfunction with a moderate PFO and spontaneous contrast in the right atrium. He was started on apixaban and returns today for further discussion regarding transcatheter PFO closure in an attempt to reduce his risk of recurrent stroke.   He has mild DOE, otherwise no complaints. denies CP, lightheadedness, palpitations, or syncope. No bleeding problems on apixaban. No other complaints.   Past Medical History:  Diagnosis Date  . Arthritis    "hands, feet" (10/31/2013)  . CHF (congestive heart failure) ([place])   . Chronic bronchitis ([place])   .  High cholesterol   . Hypertension   . Obesity   . OSA on CPAP   . Pneumonia 09/2011  . Substance abuse ([place]) 8/31/213   cociane "first time use"  . Tobacco abuse     Past Surgical History:  Procedure Laterality Date  . BUBBLE STUDY  10/15/2018   Procedure: BUBBLE STUDY;  Surgeon: Larey Dresser, MD;  Location: [facility] ENDOSCOPY;  Service: Cardiovascular;;  . NO PAST SURGERIES    . TEE WITHOUT CARDIOVERSION N/A 10/15/2018   Procedure: TRANSESOPHAGEAL ECHOCARDIOGRAM (TEE);  Surgeon: Larey Dresser, MD;  Location: [facility] ENDOSCOPY;  Service: Cardiovascular;  Laterality: N/A;  . TUBES IN EARS      Current Medications: No outpatient medications have been marked as taking for the 05/06/19 encounter (Office Visit) with Sherren Mocha, MD.     Allergies:   Lisinopril   Social History   Socioeconomic History  . Marital status: Single    Spouse name: Not on file  . Number of children: 2  . Years of education: Not on file  . Highest education level: Not on file  Occupational History  . Occupation: N/A    Employer: NOT EMPLOYED  Tobacco Use  . Smoking status: Former Smoker    Packs/day: 1.50    Years: 3.00    Pack years: 4.50    Types: Cigarettes    Quit date: 10/31/2013    Years since quitting: 5.5  . Smokeless tobacco: Never Used  Substance and Sexual Activity  . Alcohol use: No    Alcohol/week: 1.0 standard drinks    Types: 1 Cans  of beer per week  . Drug use: No    Types: Cocaine    Comment: 10/31/2013 "might use cocaine once/month" - Has not used since 2015.   Marland Kitchen Sexual activity: Never  Other Topics Concern  . Not on file  Social History Narrative  . Not on file   Social Determinants of Health   Financial Resource Strain:   . Difficulty of Paying Living Expenses:   Food Insecurity:   . Worried About Charity fundraiser in the Last Year:   . Arboriculturist in the Last Year:   Transportation Needs:   . Film/video editor (Medical):   Marland Kitchen Lack of Transportation  (Non-Medical):   Physical Activity:   . Days of Exercise per Week:   . Minutes of Exercise per Session:   Stress:   . Feeling of Stress :   Social Connections:   . Frequency of Communication with Friends and Family:   . Frequency of Social Gatherings with Friends and Family:   . Attends Religious Services:   . Active Member of Clubs or Organizations:   . Attends Archivist Meetings:   Marland Kitchen Marital Status:      Family History: The patient's family history includes Cancer in his paternal grandfather; Heart disease in his mother; Hypertension in his father. There is no history of Heart attack or Stroke.  ROS:   Please see the history of present illness.    All other systems reviewed and are negative.  EKGs/Labs/Other Studies Reviewed:    The following studies were reviewed today: TEE Oct 30, 2018: 1. Left ventricular ejection fraction, by visual estimation, is 55%. The  left ventricle has normal function. Normal left ventricular size. There is  moderately increased left ventricular hypertrophy.  2. Global right ventricle has moderately reduced systolic function.The  right ventricular size is severely enlarged. Mildly increased right  ventricular wall thickness. D-shaped septum suggestive of RV  pressure/volume overload.  3. Trivial pericardial effusion is present.  4. The tricuspid valve is normal in structure. Tricuspid valve  regurgitation is mild. Peak RV-RA gradient 38 mmHg.  5. Mild left atrial enlargement. There turbulent flow into the left  atrium adjacent to the right upper pulmonary vein. The flow is continous  by CW doppler but does not appear to be an ASD with right to left shunting  (does not originate from the RA). It  possibly represents ?a compressed pulmonary vein. Needs further  evaluation.  6. Evidence of atrial level shunting detected by color flow Doppler.  Patient has a moderate PFO with bidirectional flow. Positive bubble study.  7. Severe  right atrial enlargement. There is spontaneous contrast noted  in the RA suggesive of low flow state and risk of thrombosis.  8. The aortic root was not well visualized.  9. The aortic valve is tricuspid Aortic valve regurgitation was not  visualized by color flow Doppler. Structurally normal aortic valve, with  no evidence of sclerosis or stenosis.  10. The mitral valve is normal in structure. Trace mitral valve  regurgitation. No evidence of mitral stenosis.  11. The pulmonic valve was normal in structure. Pulmonic valve  regurgitation is not visualized by color flow Doppler.   EKG:  EKG is ordered today.  The ekg ordered today demonstrates NSR 78 bpm, RAE, RVH  Recent Labs: 2018/10/30: TSH 0.419 02/14/2019: ALT 16; B Natriuretic Peptide 437.7; Hemoglobin 17.8; Platelets 151 05/03/2019: BUN 19; Creatinine, Ser 1.20; Potassium 4.1; Sodium 141  Recent Lipid Panel  Component Value Date/Time   CHOL 115 02/14/2019 1524   TRIG 49 02/14/2019 1524   HDL 46 02/14/2019 1524   CHOLHDL 2.5 02/14/2019 1524   VLDL 10 02/14/2019 1524   LDLCALC 59 02/14/2019 1524    Physical Exam:    VS:  BP (!) 124/92   Pulse 78   Ht 5\' 5"  (1.651 m)   Wt 264 lb (119.7 kg)   SpO2 90%   BMI 43.93 kg/m     Wt Readings from Last 3 Encounters:  05/06/19 264 lb (119.7 kg)  05/02/19 258 lb 6.4 oz (117.2 kg)  04/18/19 257 lb (116.6 kg)     GEN: Obese AAM in no acute distress HEENT: Normal NECK: No JVD; No carotid bruits LYMPHATICS: No lymphadenopathy CARDIAC: RRR, no murmurs, rubs, gallops RESPIRATORY:  Clear to auscultation without rales, wheezing or rhonchi  ABDOMEN: Soft, non-tender, non-distended MUSCULOSKELETAL:  No edema; No deformity  SKIN: Warm and dry NEUROLOGIC:  Alert and oriented x 3 PSYCHIATRIC:  Normal affect   ASSESSMENT:    1. PFO (patent foramen ovale)    PLAN:    In order of problems listed above:  1. The patient has a PFO at a young age with embolic stroke. He is at high  risk of recurrence with chronic right heart failure and elevated right heart pressures which increase R--->L intracardiac shunting. We again discussed transcatheter PFO closure versus long term anticoagulation. He would like to proceed with PFO closure. I reviewed the risks, indications, and alternatives to transcatheter closure with the patient. Specific risks include bleeding, infection, device embolization, stroke, cardiac perforation, tamponade, arrhythmia, MI, and late device erosion. He understands these serious risks occur at low incidence of < 1%.    Medication Adjustments/Labs and Tests Ordered: Current medicines are reviewed at length with the patient today.  Concerns regarding medicines are outlined above.  Orders Placed This Encounter  Procedures  . CBC with Differential/Platelet  . EKG 12-Lead   Meds ordered this encounter  Medications  . aspirin EC 81 MG tablet    Sig: Take 1 tablet (81 mg total) by mouth daily.    Dispense:  90 tablet    Refill:  3    Patient Instructions  COVID SCREENING INFORMATION (4/20): You are scheduled for your COVID screening on 05/14/2019 at 12:00PM. [place] [facility] (old [facility]) [address] Stay in the RIGHT lane and proceed under the brick awning and tell them you are there for pre-procedure testing Do NOT bring any pets with you to the testing site You must go home and quarantine between testing and procedure.   PFO CLOSURE INSTRUCTIONS (4/22): You are scheduled for a PFO CLOSURE on Thursday, April 22 with Dr. Sherren Mocha.  1. Please arrive at the [facility] (Main Entrance A) at [facility]: [address] [place], [place] [address] at 10:00AM (This time is two hours before your procedure to ensure your preparation). Free valet parking service is available. You are allowed ONE visitor in the waiting room during your procedure. Both you and your visitor must wear masks. Special note: Every effort is  made to have your procedure done on time. Please understand that emergencies sometimes delay scheduled procedures.  2. Diet: Do not eat solid foods after midnight.  You may have clear liquids until 5am upon the day of the procedure.  3. Labs: Today! CBC  4. Medication instructions in preparation for your procedure:  1) Take your last dose of  ELIQUIS 4/21 in the morning  2) START ASPRIN 81 mg now (and make sure you take the day of your procedure)  3) HOLD TORSEMIDE the morning of your procedure  4) HOLD HYDRALAZINE the morning of your procedure  5) You may take your other medications as directed with sips of water  5. Plan for one night stay--bring personal belongings. 6. Bring a current list of your medications and current insurance cards. 7. You MUST have a responsible person to drive you home. 8. Someone MUST be with you the first 24 hours after you arrive home or your discharge will be delayed. 9. Please wear clothes that are easy to get on and off and wear slip-on shoes.  Thank you for letting us take care of you!    Signed, Sherren Mocha, MD  05/06/2019 4:52 PM    [place]

## 2019-05-06 NOTE — Patient Instructions (Addendum)
COVID SCREENING INFORMATION (4/20): You are scheduled for your COVID screening on 05/14/2019 at 12:00PM. [place] [facility] (old [facility]) [address] Stay in the RIGHT lane and proceed under the brick awning and tell them you are there for pre-procedure testing Do NOT bring any pets with you to the testing site You must go home and quarantine between testing and procedure.   PFO CLOSURE INSTRUCTIONS (4/22): You are scheduled for a PFO CLOSURE on Thursday, April 22 with Dr. Sherren Mocha.  1. Please arrive at the [facility] (Main Entrance A) at [facility]: [address] [place], [place] [address] at 10:00AM (This time is two hours before your procedure to ensure your preparation). Free valet parking service is available. You are allowed ONE visitor in the waiting room during your procedure. Both you and your visitor must wear masks. Special note: Every effort is made to have your procedure done on time. Please understand that emergencies sometimes delay scheduled procedures.  2. Diet: Do not eat solid foods after midnight.  You may have clear liquids until 5am upon the day of the procedure.  3. Labs: Today! CBC  4. Medication instructions in preparation for your procedure:  1) Take your last dose of ELIQUIS 4/21 in the morning  2) START ASPRIN 81 mg now (and make sure you take the day of your procedure)  3) HOLD TORSEMIDE the morning of your procedure  4) HOLD HYDRALAZINE the morning of your procedure  5) You may take your other medications as directed with sips of water  5. Plan for one night stay--bring personal belongings. 6. Bring a current list of your medications and current insurance cards. 7. You MUST have a responsible person to drive you home. 8. Someone MUST be with you the first 24 hours after you arrive home or your discharge will be delayed. 9. Please wear clothes that are easy to get on and off and wear slip-on shoes.  Thank you for  letting us take care of you!

## 2019-05-07 LAB — CBC WITH DIFFERENTIAL/PLATELET
Basophils Absolute: 0 10*3/uL (ref 0.0–0.2)
Basos: 0 %
EOS (ABSOLUTE): 0.1 10*3/uL (ref 0.0–0.4)
Eos: 2 %
Hematocrit: 56.9 % — ABNORMAL HIGH (ref 37.5–51.0)
Hemoglobin: 18 g/dL — ABNORMAL HIGH (ref 13.0–17.7)
Immature Grans (Abs): 0 10*3/uL (ref 0.0–0.1)
Immature Granulocytes: 0 %
Lymphocytes Absolute: 1.8 10*3/uL (ref 0.7–3.1)
Lymphs: 25 %
MCH: 28.7 pg (ref 26.6–33.0)
MCHC: 31.6 g/dL (ref 31.5–35.7)
MCV: 91 fL (ref 79–97)
Monocytes Absolute: 0.9 10*3/uL (ref 0.1–0.9)
Monocytes: 13 %
Neutrophils Absolute: 4.3 10*3/uL (ref 1.4–7.0)
Neutrophils: 60 %
Platelets: 157 10*3/uL (ref 150–450)
RBC: 6.27 x10E6/uL — ABNORMAL HIGH (ref 4.14–5.80)
RDW: 12.3 % (ref 11.6–15.4)
WBC: 7.2 10*3/uL (ref 3.4–10.8)

## 2019-05-08 ENCOUNTER — Other Ambulatory Visit (HOSPITAL_COMMUNITY): Payer: Self-pay | Admitting: Cardiology

## 2019-05-08 ENCOUNTER — Other Ambulatory Visit: Payer: Self-pay | Admitting: Pulmonary Disease

## 2019-05-14 ENCOUNTER — Other Ambulatory Visit (HOSPITAL_COMMUNITY)
Admission: RE | Admit: 2019-05-14 | Discharge: 2019-05-14 | Disposition: A | Discharge status: Home / Self Care | Payer: Medicaid Other | Source: Ambulatory Visit | Attending: Cardiovascular Disease | Admitting: Cardiovascular Disease

## 2019-05-14 DIAGNOSIS — Z20822 Contact with and (suspected) exposure to covid-19: Secondary | ICD-10-CM | POA: Diagnosis not present

## 2019-05-14 DIAGNOSIS — Z01812 Encounter for preprocedural laboratory examination: Secondary | ICD-10-CM | POA: Insufficient documentation

## 2019-05-14 LAB — SARS CORONAVIRUS 2 (TAT 6-24 HRS): SARS Coronavirus 2: NEGATIVE

## 2019-05-16 ENCOUNTER — Encounter (HOSPITAL_COMMUNITY)
Admission: RE | Disposition: A | Discharge status: Home / Self Care | Payer: Self-pay | Source: Home / Self Care | Attending: Cardiovascular Disease

## 2019-05-16 ENCOUNTER — Ambulatory Visit (HOSPITAL_BASED_OUTPATIENT_CLINIC_OR_DEPARTMENT_OTHER): Payer: Medicaid Other

## 2019-05-16 ENCOUNTER — Ambulatory Visit (HOSPITAL_COMMUNITY)
Admission: RE | Admit: 2019-05-16 | Discharge: 2019-05-16 | Disposition: A | Discharge status: Home / Self Care | Payer: Medicaid Other | Attending: Cardiovascular Disease | Admitting: Cardiovascular Disease

## 2019-05-16 ENCOUNTER — Other Ambulatory Visit: Payer: Self-pay

## 2019-05-16 DIAGNOSIS — I110 Hypertensive heart disease with heart failure: Secondary | ICD-10-CM | POA: Insufficient documentation

## 2019-05-16 DIAGNOSIS — Q211 Atrial septal defect: Secondary | ICD-10-CM

## 2019-05-16 DIAGNOSIS — G4733 Obstructive sleep apnea (adult) (pediatric): Secondary | ICD-10-CM | POA: Insufficient documentation

## 2019-05-16 DIAGNOSIS — I509 Heart failure, unspecified: Secondary | ICD-10-CM | POA: Diagnosis not present

## 2019-05-16 DIAGNOSIS — Z8673 Personal history of transient ischemic attack (TIA), and cerebral infarction without residual deficits: Secondary | ICD-10-CM | POA: Diagnosis not present

## 2019-05-16 DIAGNOSIS — Z888 Allergy status to other drugs, medicaments and biological substances status: Secondary | ICD-10-CM | POA: Insufficient documentation

## 2019-05-16 DIAGNOSIS — Z87891 Personal history of nicotine dependence: Secondary | ICD-10-CM | POA: Insufficient documentation

## 2019-05-16 DIAGNOSIS — E669 Obesity, unspecified: Secondary | ICD-10-CM | POA: Diagnosis not present

## 2019-05-16 DIAGNOSIS — E7800 Pure hypercholesterolemia, unspecified: Secondary | ICD-10-CM | POA: Diagnosis not present

## 2019-05-16 DIAGNOSIS — M1990 Unspecified osteoarthritis, unspecified site: Secondary | ICD-10-CM | POA: Diagnosis not present

## 2019-05-16 DIAGNOSIS — Z8249 Family history of ischemic heart disease and other diseases of the circulatory system: Secondary | ICD-10-CM | POA: Insufficient documentation

## 2019-05-16 DIAGNOSIS — I420 Dilated cardiomyopathy: Secondary | ICD-10-CM | POA: Diagnosis not present

## 2019-05-16 DIAGNOSIS — Q2112 Patent foramen ovale: Secondary | ICD-10-CM

## 2019-05-16 HISTORY — PX: PATENT FORAMEN OVALE(PFO) CLOSURE: CATH118300

## 2019-05-16 LAB — POCT ACTIVATED CLOTTING TIME
Activated Clotting Time: 230 seconds
Activated Clotting Time: 268 seconds

## 2019-05-16 LAB — ECHOCARDIOGRAM LIMITED
Height: 64 in
Weight: 4192 oz

## 2019-05-16 SURGERY — PATENT FORAMEN OVALE (PFO) CLOSURE
Anesthesia: LOCAL

## 2019-05-16 MED ORDER — SODIUM CHLORIDE 0.9% FLUSH: 3.0000 mL | Freq: Two times a day (BID) | INTRAVENOUS | Status: DC

## 2019-05-16 MED ORDER — ASPIRIN 81 MG PO CHEW: 81.0000 mg | CHEWABLE_TABLET | ORAL | Status: DC

## 2019-05-16 MED ORDER — MIDAZOLAM HCL 2 MG/2ML IJ SOLN
INTRAMUSCULAR | Status: AC
  Filled 2019-05-16: qty 2

## 2019-05-16 MED ORDER — HEPARIN SODIUM (PORCINE) 1000 UNIT/ML IJ SOLN
INTRAMUSCULAR | Status: AC
  Filled 2019-05-16: qty 1

## 2019-05-16 MED ORDER — LIDOCAINE HCL (PF) 1 % IJ SOLN
INTRAMUSCULAR | Status: DC | PRN
  Administered 2019-05-16: 20 mL via INTRADERMAL

## 2019-05-16 MED ORDER — ACETAMINOPHEN 325 MG PO TABS: 650.0000 mg | ORAL_TABLET | ORAL | Status: DC | PRN

## 2019-05-16 MED ORDER — SODIUM CHLORIDE 0.9 % IV SOLN: 250.0000 mL | INTRAVENOUS | Status: DC | PRN

## 2019-05-16 MED ORDER — SODIUM CHLORIDE 0.9 % WEIGHT BASED INFUSION
1.0000 mL/kg/h | INTRAVENOUS | Status: DC
  Administered 2019-05-16: 1 mL/kg/h via INTRAVENOUS

## 2019-05-16 MED ORDER — CEFAZOLIN SODIUM-DEXTROSE 2-4 GM/100ML-% IV SOLN
2.0000 g | INTRAVENOUS | Status: AC
  Administered 2019-05-16: 2 g via INTRAVENOUS

## 2019-05-16 MED ORDER — SODIUM CHLORIDE 0.9% FLUSH: 3.0000 mL | INTRAVENOUS | Status: DC | PRN

## 2019-05-16 MED ORDER — ONDANSETRON HCL 4 MG/2ML IJ SOLN: 4.0000 mg | Freq: Four times a day (QID) | INTRAMUSCULAR | Status: DC | PRN

## 2019-05-16 MED ORDER — FENTANYL CITRATE (PF) 100 MCG/2ML IJ SOLN
INTRAMUSCULAR | Status: DC | PRN
  Administered 2019-05-16: 25 ug via INTRAVENOUS

## 2019-05-16 MED ORDER — HEPARIN (PORCINE) IN NACL 1000-0.9 UT/500ML-% IV SOLN
INTRAVENOUS | Status: AC
  Filled 2019-05-16: qty 500

## 2019-05-16 MED ORDER — CEFAZOLIN SODIUM-DEXTROSE 2-4 GM/100ML-% IV SOLN
INTRAVENOUS | Status: AC
  Filled 2019-05-16: qty 100

## 2019-05-16 MED ORDER — HEPARIN SODIUM (PORCINE) 1000 UNIT/ML IJ SOLN
INTRAMUSCULAR | Status: DC | PRN
  Administered 2019-05-16: 9000 [IU] via INTRAVENOUS
  Administered 2019-05-16: 2000 [IU] via INTRAVENOUS

## 2019-05-16 MED ORDER — FENTANYL CITRATE (PF) 100 MCG/2ML IJ SOLN
INTRAMUSCULAR | Status: AC
  Filled 2019-05-16: qty 2

## 2019-05-16 MED ORDER — LABETALOL HCL 5 MG/ML IV SOLN: 10.0000 mg | INTRAVENOUS | Status: DC | PRN

## 2019-05-16 MED ORDER — HYDRALAZINE HCL 20 MG/ML IJ SOLN: 10.0000 mg | INTRAMUSCULAR | Status: DC | PRN

## 2019-05-16 MED ORDER — MIDAZOLAM HCL 2 MG/2ML IJ SOLN
INTRAMUSCULAR | Status: DC | PRN
  Administered 2019-05-16: 1 mg via INTRAVENOUS

## 2019-05-16 MED ORDER — SODIUM CHLORIDE 0.9 % WEIGHT BASED INFUSION
3.0000 mL/kg/h | INTRAVENOUS | Status: AC
  Administered 2019-05-16: 11:00:00 3 mL/kg/h via INTRAVENOUS

## 2019-05-16 MED ORDER — HEPARIN (PORCINE) IN NACL 1000-0.9 UT/500ML-% IV SOLN
INTRAVENOUS | Status: DC | PRN
  Administered 2019-05-16 (×2): 500 mL

## 2019-05-16 MED ORDER — LIDOCAINE HCL (PF) 1 % IJ SOLN
INTRAMUSCULAR | Status: AC
  Filled 2019-05-16: qty 30

## 2019-05-16 SURGICAL SUPPLY — 18 items
CATH ACUNAV 8FR 90CM (CATHETERS) ×1 IMPLANT
CATH EXPO 5F MPA-1 (CATHETERS) ×1 IMPLANT
CATH INFINITI 5 FR RCB (CATHETERS) ×1 IMPLANT
COVER SWIFTLINK CONNECTOR (BAG) ×1 IMPLANT
DEVICE CLOSURE PERCLS PRGLD 6F (VASCULAR PRODUCTS) IMPLANT
GUIDEWIRE AMPLATZER 1.5JX260 (WIRE) ×1 IMPLANT
GUIDEWIRE ANGLED .035X150CM (WIRE) ×1 IMPLANT
OCCLUDER AMPLATZER PFO 25MM (Prosthesis & Implant Heart) ×1 IMPLANT
PACK CARDIAC CATHETERIZATION (CUSTOM PROCEDURE TRAY) ×2 IMPLANT
PERCLOSE PROGLIDE 6F (VASCULAR PRODUCTS) ×4
PROTECTION STATION PRESSURIZED (MISCELLANEOUS) ×2
SHEATH INTROD W/O MIN 9FR 25CM (SHEATH) ×1 IMPLANT
SHEATH PINNACLE 8F 10CM (SHEATH) ×1 IMPLANT
SHEATH PROBE COVER 6X72 (BAG) ×1 IMPLANT
STATION PROTECTION PRESSURIZED (MISCELLANEOUS) IMPLANT
SYS DELIVER AMP TREVISIO 8FR (SHEATH) ×2
SYSTEM DELIVER AMP TREVIS 8FR (SHEATH) IMPLANT
WIRE EMERALD 3MM-J .035X150CM (WIRE) ×1 IMPLANT

## 2019-05-16 NOTE — Progress Notes (Signed)
Patient and sister was given discharge instructions. Both verbalized understanding. 

## 2019-05-16 NOTE — Discharge Instructions (Signed)
You will require antibiotics prior to any dental work, including cleanings, for 6 months after your PFO closure. This is to protect the device from potentially getting infected from bacteria in your mouth entering your bloodstream. The medication has been called into your pharmacy on file. Please pick this up to have ready before any scheduled dental work. Instructions will be outlined on the bottle. The medication should be taken 1 hour prior to your dental appointment.   Groin Site Care Refer to this sheet in the next few weeks. These instructions provide you with information on caring for yourself after your procedure. Your caregiver may also give you more specific instructions. Your treatment has been planned according to current medical practices, but problems sometimes occur. Call your caregiver if you have any problems or questions after your procedure. HOME CARE INSTRUCTIONS  You may shower 24 hours after the procedure. Remove the bandage (dressing) and gently wash the site with plain soap and water. Gently pat the site dry.   Do not apply powder or lotion to the site.   Do not sit in a bathtub, swimming pool, or whirlpool for 5 to 7 days.   No bending, squatting, or lifting anything over 10 pounds (4.5 kg) for 1 week  Inspect the site at least twice daily.   Do not drive home if you are discharged the same day of the procedure. Have someone else drive you.   You may drive 24 hours after the procedure unless otherwise instructed by your caregiver.  What to expect:  Any bruising will usually fade within 1 to 2 weeks.   Blood that collects in the tissue (hematoma) may be painful to the touch. It should usually decrease in size and tenderness within 1 to 2 weeks.  SEEK IMMEDIATE MEDICAL CARE IF:  You have unusual pain at the groin site or down the affected leg.   You have redness, warmth, swelling, or pain at the groin site.   You have drainage (other than a small amount of blood  on the dressing).   You have chills.   You have a fever or persistent symptoms for more than 72 hours.   You have a fever and your symptoms suddenly get worse.   Your leg becomes pale, cool, tingly, or numb.  You have heavy bleeding from the site. Hold pressure on the site. 

## 2019-05-16 NOTE — Progress Notes (Signed)
  Echocardiogram 2D Echocardiogram has been performed.  Douglas [name] 05/16/2019, 4:31 PM

## 2019-05-16 NOTE — Progress Notes (Signed)
Patient got up to the restroom. Patient went back to his room and called me and stated that he was bleeding. Pressure applied and bleeding stopped. Will continue to monitor.

## 2019-05-16 NOTE — Interval H&P Note (Signed)
History and Physical Interval Note:  05/16/2019 12:00 PM  Douglas D Bontrager Jr.  has presented today for surgery, with the diagnosis of pfo.  The various methods of treatment have been discussed with the patient and family. After consideration of risks, benefits and other options for treatment, the patient has consented to  Procedure(s): PATENT FORAMEN OVALE (PFO) CLOSURE (N/A) as a surgical intervention.  The patient's history has been reviewed, patient examined, no change in status, stable for surgery.  I have reviewed the patient's chart and labs.  Questions were answered to the patient's satisfaction.     Sherren Mocha

## 2019-05-21 ENCOUNTER — Telehealth (HOSPITAL_COMMUNITY): Payer: Self-pay

## 2019-05-21 NOTE — Telephone Encounter (Signed)
I called Mr Dittmar to schedule an appointment. He stated he would be available on Thursday so we agreed to meet at 13:00.  [name], EMT  05/21/19

## 2019-05-23 ENCOUNTER — Other Ambulatory Visit (HOSPITAL_COMMUNITY): Payer: Self-pay | Admitting: *Deleted

## 2019-05-23 ENCOUNTER — Other Ambulatory Visit (HOSPITAL_COMMUNITY): Payer: Self-pay

## 2019-05-23 MED ORDER — ALBUTEROL SULFATE HFA 108 (90 BASE) MCG/ACT IN AERS: 1.0000 | INHALATION_SPRAY | Freq: Four times a day (QID) | RESPIRATORY_TRACT | 3 refills | Status: DC | PRN

## 2019-05-23 NOTE — Progress Notes (Signed)
Paramedicine Encounter    Patient ID: Douglas Madura., male    DOB: 09/06/1985, 34 y.o.   MRN: [id number]   Patient Care Team: Patient, No Pcp Per as PCP - General (General Practice)  Patient Active Problem List   Diagnosis Date Noted  . PFO (patent foramen ovale) 05/16/2019  . CVA (cerebral vascular accident) ([place]) 10/14/2018  . Morbid obesity ([place]) 06/17/2015  . Essential hypertension 04/14/2015  . Hyperkalemia 04/14/2015  . OSA (obstructive sleep apnea)   . Difficult intravenous access   . Chronic diastolic CHF (congestive heart failure), NYHA class 3 ([place]) 09/13/2014  . Cor pulmonale, chronic ([place]) 03/19/2014  . OSA treated with BiPAP     Current Outpatient Medications:  .  acetaminophen (TYLENOL) 500 MG tablet, Take 1,000 mg by mouth every 6 (six) hours as needed for moderate pain or headache., Disp: , Rfl:  .  albuterol (PROVENTIL HFA) 108 (90 Base) MCG/ACT inhaler, Inhale 1-2 puffs into the lungs every 6 (six) hours as needed for wheezing or shortness of breath., Disp: , Rfl:  .  apixaban (ELIQUIS) 5 MG TABS tablet, Take 1 tablet (5 mg total) by mouth 2 (two) times daily., Disp: 60 tablet, Rfl: 6 .  aspirin EC 81 MG tablet, Take 1 tablet (81 mg total) by mouth daily., Disp: 90 tablet, Rfl: 3 .  atorvastatin (LIPITOR) 80 MG tablet, Take 1 tablet (80 mg total) by mouth daily at 6 PM., Disp: 30 tablet, Rfl: 11 .  brimonidine (ALPHAGAN) 0.2 % ophthalmic solution, Place 1 drop into both eyes in the morning and at bedtime., Disp: , Rfl:  .  carvedilol (COREG) 6.25 MG tablet, TAKE 1 AND 1/2 TABLET BY MOUTH TWICE A DAY, Disp: 90 tablet, Rfl: 5 .  hydrALAZINE (APRESOLINE) 25 MG tablet, TAKE 1 TABLET (25 MG TOTAL) BY MOUTH 3 (THREE) TIMES DAILY., Disp: 270 tablet, Rfl: 1 .  OXYGEN, Inhale 2 L/min into the lungs continuous., Disp: , Rfl:  .  potassium chloride SA (KLOR-CON) 20 MEQ tablet, Take 1 tablet (20 mEq total) by mouth daily., Disp: 60 tablet, Rfl: 3 .  sildenafil (VIAGRA)  50 MG tablet, Take 1 tablet (50 mg total) by mouth daily as needed for erectile dysfunction., Disp: 10 tablet, Rfl: 1 .  torsemide (DEMADEX) 20 MG tablet, Take 4 tablets (80 mg total) by mouth 2 (two) times daily., Disp: 240 tablet, Rfl: 2 Allergies  Allergen Reactions  . Lisinopril Cough      Social History   Socioeconomic History  . Marital status: Single    Spouse name: Not on file  . Number of children: 2  . Years of education: Not on file  . Highest education level: Not on file  Occupational History  . Occupation: N/A    Employer: NOT EMPLOYED  Tobacco Use  . Smoking status: Former Smoker    Packs/day: 1.50    Years: 3.00    Pack years: 4.50    Types: Cigarettes    Quit date: 10/31/2013    Years since quitting: 5.5  . Smokeless tobacco: Never Used  Substance and Sexual Activity  . Alcohol use: No    Alcohol/week: 1.0 standard drinks    Types: 1 Cans of beer per week  . Drug use: No    Types: Cocaine    Comment: 10/31/2013 "might use cocaine once/month" - Has not used since 2015.   Marland Kitchen Sexual activity: Never  Other Topics Concern  . Not on file  Social History Narrative  .  Not on file   Social Determinants of Health   Financial Resource Strain:   . Difficulty of Paying Living Expenses:   Food Insecurity:   . Worried About Charity fundraiser in the Last Year:   . Arboriculturist in the Last Year:   Transportation Needs:   . Film/video editor (Medical):   Marland Kitchen Lack of Transportation (Non-Medical):   Physical Activity:   . Days of Exercise per Week:   . Minutes of Exercise per Session:   Stress:   . Feeling of Stress :   Social Connections:   . Frequency of Communication with Friends and Family:   . Frequency of Social Gatherings with Friends and Family:   . Attends Religious Services:   . Active Member of Clubs or Organizations:   . Attends Archivist Meetings:   Marland Kitchen Marital Status:   Intimate Partner Violence:   . Fear of Current or  Ex-Partner:   . Emotionally Abused:   Marland Kitchen Physically Abused:   . Sexually Abused:     Physical Exam Cardiovascular:     Rate and Rhythm: Normal rate and regular rhythm.     Pulses: Normal pulses.  Pulmonary:     Effort: Pulmonary effort is normal.     Breath sounds: Normal breath sounds.  Musculoskeletal:        General: Normal range of motion.     Right lower leg: No edema.     Left lower leg: No edema.  Skin:    General: Skin is warm and dry.     Capillary Refill: Capillary refill takes less than 2 seconds.  Neurological:     Mental Status: He is alert and oriented to person, place, and time.         Future Appointments  Date Time Provider [place]  06/20/2019  2:00 PM [facility] PA/NP [facility] None    BP 132/84 (BP Location: Left Arm, Patient Position: Sitting, Cuff Size: Normal)   Pulse 74   Resp 18   Wt 261 lb (118.4 kg)   SpO2 91%   BMI 44.80 kg/m   Weight yesterday- 264 lb Last visit weight- 262 lb  Douglas [name] was seen at home today and reported feeling well. He denied chest pain, SOB, headache, dizziness, orthopnea, fever or cough over the past several weeks. He stated he has been compliant with his medications and his weight has been stable. His medications were verified and he was reminded of his next appointment. I will follow up in two weeks.   [name], EMT 05/23/19  ACTION: Home visit completed Next visit planned for 2 weeks

## 2019-05-24 ENCOUNTER — Other Ambulatory Visit (HOSPITAL_COMMUNITY): Payer: Self-pay | Admitting: Cardiology

## 2019-06-14 ENCOUNTER — Telehealth (HOSPITAL_COMMUNITY): Payer: Self-pay

## 2019-06-14 NOTE — Telephone Encounter (Signed)
Mr Schmeichel called me this afternoon to say he was not going to be able to keep out appointment for 15:00 because ehis sister was taking him somewhere. I advised I would reach out next week and he was agreeable.   [name], EMT 06/14/19

## 2019-06-19 ENCOUNTER — Ambulatory Visit: Payer: Medicaid Other | Admitting: Physician Assistant

## 2019-06-20 ENCOUNTER — Encounter (HOSPITAL_COMMUNITY): Payer: Self-pay

## 2019-06-20 ENCOUNTER — Other Ambulatory Visit: Payer: Self-pay

## 2019-06-20 ENCOUNTER — Telehealth (HOSPITAL_COMMUNITY): Payer: Self-pay | Admitting: *Deleted

## 2019-06-20 ENCOUNTER — Other Ambulatory Visit (HOSPITAL_COMMUNITY): Payer: Self-pay

## 2019-06-20 ENCOUNTER — Ambulatory Visit (HOSPITAL_COMMUNITY)
Admission: RE | Admit: 2019-06-20 | Discharge: 2019-06-20 | Disposition: A | Discharge status: Home / Self Care | Payer: Medicaid Other | Source: Ambulatory Visit | Attending: Cardiology | Admitting: Cardiology

## 2019-06-20 VITALS — BP 118/72 | HR 120 | Wt 285.0 lb

## 2019-06-20 DIAGNOSIS — M1990 Unspecified osteoarthritis, unspecified site: Secondary | ICD-10-CM | POA: Insufficient documentation

## 2019-06-20 DIAGNOSIS — R002 Palpitations: Secondary | ICD-10-CM | POA: Insufficient documentation

## 2019-06-20 DIAGNOSIS — R6881 Early satiety: Secondary | ICD-10-CM | POA: Diagnosis not present

## 2019-06-20 DIAGNOSIS — Z79899 Other long term (current) drug therapy: Secondary | ICD-10-CM | POA: Insufficient documentation

## 2019-06-20 DIAGNOSIS — I5033 Acute on chronic diastolic (congestive) heart failure: Secondary | ICD-10-CM | POA: Insufficient documentation

## 2019-06-20 DIAGNOSIS — E7800 Pure hypercholesterolemia, unspecified: Secondary | ICD-10-CM | POA: Diagnosis not present

## 2019-06-20 DIAGNOSIS — I2510 Atherosclerotic heart disease of native coronary artery without angina pectoris: Secondary | ICD-10-CM | POA: Diagnosis not present

## 2019-06-20 DIAGNOSIS — I5032 Chronic diastolic (congestive) heart failure: Secondary | ICD-10-CM | POA: Diagnosis not present

## 2019-06-20 DIAGNOSIS — Z7901 Long term (current) use of anticoagulants: Secondary | ICD-10-CM | POA: Diagnosis not present

## 2019-06-20 DIAGNOSIS — Z8249 Family history of ischemic heart disease and other diseases of the circulatory system: Secondary | ICD-10-CM | POA: Diagnosis not present

## 2019-06-20 DIAGNOSIS — I4892 Unspecified atrial flutter: Secondary | ICD-10-CM | POA: Insufficient documentation

## 2019-06-20 DIAGNOSIS — G4733 Obstructive sleep apnea (adult) (pediatric): Secondary | ICD-10-CM | POA: Diagnosis not present

## 2019-06-20 DIAGNOSIS — Z87891 Personal history of nicotine dependence: Secondary | ICD-10-CM | POA: Insufficient documentation

## 2019-06-20 DIAGNOSIS — I110 Hypertensive heart disease with heart failure: Secondary | ICD-10-CM | POA: Diagnosis not present

## 2019-06-20 DIAGNOSIS — I4891 Unspecified atrial fibrillation: Secondary | ICD-10-CM | POA: Insufficient documentation

## 2019-06-20 LAB — BASIC METABOLIC PANEL
Anion gap: 10 (ref 5–15)
BUN: 21 mg/dL — ABNORMAL HIGH (ref 6–20)
CO2: 42 mmol/L — ABNORMAL HIGH (ref 22–32)
Calcium: 8.2 mg/dL — ABNORMAL LOW (ref 8.9–10.3)
Chloride: 87 mmol/L — ABNORMAL LOW (ref 98–111)
Creatinine, Ser: 1.64 mg/dL — ABNORMAL HIGH (ref 0.61–1.24)
GFR calc Af Amer: >60 mL/min (ref >60)
GFR calc non Af Amer: 53 mL/min — ABNORMAL LOW (ref >60)
Glucose, Bld: 79 mg/dL (ref 70–99)
Potassium: 4.8 mmol/L (ref 3.5–5.1)
Sodium: 139 mmol/L (ref 135–145)

## 2019-06-20 LAB — CBC
HCT: 58.4 % — ABNORMAL HIGH (ref 39.0–52.0)
Hemoglobin: 16 g/dL (ref 13.0–17.0)
MCH: 29.4 pg (ref 26.0–34.0)
MCHC: 27.4 g/dL — ABNORMAL LOW (ref 30.0–36.0)
MCV: 107.4 fL — ABNORMAL HIGH (ref 80.0–100.0)
Platelets: UNDETERMINED 10*3/uL (ref 150–400)
RBC: 5.44 MIL/uL (ref 4.22–5.81)
RDW: 15.5 % (ref 11.5–15.5)
WBC: 8.1 10*3/uL (ref 4.0–10.5)
nRBC: 0 % (ref 0.0–0.2)

## 2019-06-20 LAB — MAGNESIUM: Magnesium: 1.6 mg/dL — ABNORMAL LOW (ref 1.7–2.4)

## 2019-06-20 LAB — HEMOGLOBIN A1C
Hgb A1c MFr Bld: 5.7 % — ABNORMAL HIGH (ref 4.8–5.6)
Mean Plasma Glucose: 116.89 mg/dL

## 2019-06-20 LAB — BRAIN NATRIURETIC PEPTIDE: B Natriuretic Peptide: 243.3 pg/mL — ABNORMAL HIGH (ref 0.0–100.0)

## 2019-06-20 LAB — TSH: TSH: 1.294 u[IU]/mL (ref 0.350–4.500)

## 2019-06-20 MED ORDER — TORSEMIDE 20 MG PO TABS: ORAL_TABLET | ORAL | 2 refills | Status: DC

## 2019-06-20 MED ORDER — POTASSIUM CHLORIDE CRYS ER 20 MEQ PO TBCR: 60.0000 meq | EXTENDED_RELEASE_TABLET | Freq: Every day | ORAL | 3 refills | Status: DC

## 2019-06-20 MED ORDER — CARVEDILOL 12.5 MG PO TABS: 12.5000 mg | ORAL_TABLET | Freq: Two times a day (BID) | ORAL | 6 refills | Status: DC

## 2019-06-20 NOTE — Patient Instructions (Signed)
Increase Torsemide to 100 mg (5 tabs) Twice daily FOR 2 DAYS ONLY, then take:  100 mg (5 tabs) in AM and 80 mg (4 tabs) in PM  Increase Carvedilol to 12.5 mg Twice daily   Increase Potassium to 60 meq (3 tabs) daily  Labs done today, we will call you for abnormal results  Your physician recommends that you schedule a follow-up appointment in: 3-4 weeks  PLEASE CALL OUR OFFICE AND LET us KNOW WHAT DAY NEXT WEEK YOU CAN HAVE YOUR CARDIOVERSION, [phone] Opt 2, leave a mess with the day and the nurse will call you back  Your physician has requested that you have a TEE/Cardioversion. During a TEE, sound waves are used to create images of your heart. It provides your doctor with information about the size and shape of your heart and how well your heart's chambers and valves are working. In this test, a transducer is attached to the end of a flexible tube that is guided down you throat and into your esophagus (the tube leading from your mouth to your stomach) to get a more detailed image of your heart. Once the TEE has determined that a blood clot is not present, the cardioversion begins. Electrical Cardioversion uses a jolt of electricity to your heart either through paddles or wired patches attached to your chest. This is a controlled, usually prescheduled, procedure. This procedure is done at the hospital and you are not awake during the procedure. You usually go home the day of the procedure. Please see the instruction sheet given to you today for more information.   CARDIOVERSION INSTRUCTIONS:  You are scheduled for a TEE Cardioversion on TBD with Dr. Aundra Dubin.  Please arrive at the [facility] (Main Entrance A) at [facility]: [address] [place], [place] [address] at TBD am/pm.   DIET: Nothing to eat or drink after midnight except a sip of water with medications (see medication instructions below)  Medication Instructions: Hold TORSEMIDE AM OF TEST  Continue your  anticoagulant: ELIQUIS, please DO NOT MISS any doses You will need to continue your anticoagulant after your procedure until you  are told by your  Provider that it is safe to stop   COVID TEST NEEDED  You must have a responsible person to drive you home and stay in the waiting area during your procedure. Failure to do so could result in cancellation.  Bring your insurance cards.  *Special Note: Every effort is made to have your procedure done on time. Occasionally there are emergencies that occur at the hospital that may cause delays. Please be patient if a delay does occur.

## 2019-06-20 NOTE — Progress Notes (Signed)
Paramedicine Encounter   Patient ID: Douglas [name] , male,   DOB: February 15, 1985,33 y.o.,  MRN: [id number]  Mr Surowiec was seen at the HF clinic today and reported feeling generally well. He was noted to by tachycardic and his weight was elevated significantly. Per Lyda Jester, PA-C, he is to increase torsemide to 100 mg BID for two days and then to 100 mg in the morning and 80 mg in the evening. Additionally he is increasing potassium to 60 mEq daily and carvedilol to 12.5 mg BID. I will follow up Tuesday morning.   [name], EMT 06/20/2019   ACTION: Next visit planned for 1 week

## 2019-06-20 NOTE — Progress Notes (Signed)
Patient ID: Douglas [name]., male   DOB: [birth year], 34 y.o.   MRN: [id number]    Advanced Heart Failure Clinic Note   PCP: Patient, No Pcp Per Primary Cardiologist: Dr Aundra Dubin  Pulmonary: Dr Halford Chessman    HPI: Mr Schuld is a 34 y.o. with OHS/OSA, morbid obesity, HTN, diastolic CHF with prominent RV failure.   Admitted in August 2016  to [facility] with volume overload. Diuresed with lasix drip and diamox. Transitioned to lasix 80 mg twice a day. Discharge weight 257 pounds.   Admit to [facility] 3/21 through 04/28/15 with respiratory distress. Required short term intubation. Apparently had not been wearing BiPap. Discharge weight was 259 pounds.   Admitted with AMS in the setting of acute stroke in 9/20. Suspect that he had a cardio-embolic CVA. On TEE,  swirling with spontaneous contrast noted in the severely dilated RA. There was a moderate-sized PFO with bidirectional shunting. Strongly suspect this was the source of his CVA. Atrial fibrillation has not been seen, no LA appendage thrombus.  Coronary CTA in 9/20 showed nonobstructive mild CAD.  Discharged on Eliquis.  He was referred to Dr. Burt Knack for PFO closure, which was performed 05/16/19.   His last f/u was w/ Dr. Aundra Dubin in March. He was mildly volume overloaded at the time and torsemide was increased to 80 mg bid.   He returns today for followup of OHS/OSA, diastolic CHF with RV failure, and CVA with PFO.  Right-sided weakness and dysarthria with stroke are almost totally resolved.  He is using home oxygen but does not have CPAP.  He is trying to get a sleep study but has to pay a lot out of pocket for it and does not have the money right now.   He is here today  w/ paramedicine. Wt is up 16 lb since last OV. BP stable but tachycardic in the 120s. EKG shows new atrial flutter. He has felt palpitations for a few days now. Dyspnea slightly worse w/ activity, compared to usual baseline but he has not had to increase his supp O2, remains on 2L. He admits  that he missed a dose of two of torsemide recently. Does not eat much. Notes early satiety. He does drink a lot of fluids. He believes he has missed a dose of two of Eliquis in the last month.  He has been contacted by sleep clinic regarding sleep study. He says he can get study done if he is able to make monthly payments.   - ECHO 03/20/2014 EF 60-65% - ECHO 8/18: EF 55-60%, grade II diastolic dydsfunction, D-shaped septum suggesting RV pressure/volume overload, severe RV dilation with mild-moderately decreased RV systolic function, unable to estimate PA pressure.  - ECHO 5/20: EF 55-60%, moderate RV dilation with mildly decreased systolic function, unable to estimate PASP, D-shaped interventricular septum.  - TEE 9/20: EF 55%, severely dilated RV with moderately decreased systolic function, D-shaped septum, large PFO with bidirectional shunting, severe RAE with spontaneous contrast in RA.  - Coronary CTA (9/20): Nonobstructive disease.   Review of systems complete and found to be negative unless listed in HPI.   SH:  Social History   Socioeconomic History  . Marital status: Single    Spouse name: Not on file  . Number of children: 2  . Years of education: Not on file  . Highest education level: Not on file  Occupational History  . Occupation: N/A    Employer: NOT EMPLOYED  Tobacco Use  . Smoking status: Former  Smoker    Packs/day: 1.50    Years: 3.00    Pack years: 4.50    Types: Cigarettes    Quit date: 10/31/2013    Years since quitting: 5.6  . Smokeless tobacco: Never Used  Substance and Sexual Activity  . Alcohol use: No    Alcohol/week: 1.0 standard drinks    Types: 1 Cans of beer per week  . Drug use: No    Types: Cocaine    Comment: 10/31/2013 "might use cocaine once/month" - Has not used since 2015.   Marland Kitchen Sexual activity: Never  Other Topics Concern  . Not on file  Social History Narrative  . Not on file   Social Determinants of Health   Financial Resource Strain:     . Difficulty of Paying Living Expenses:   Food Insecurity:   . Worried About Charity fundraiser in the Last Year:   . Arboriculturist in the Last Year:   Transportation Needs:   . Film/video editor (Medical):   Marland Kitchen Lack of Transportation (Non-Medical):   Physical Activity:   . Days of Exercise per Week:   . Minutes of Exercise per Session:   Stress:   . Feeling of Stress :   Social Connections:   . Frequency of Communication with Friends and Family:   . Frequency of Social Gatherings with Friends and Family:   . Attends Religious Services:   . Active Member of Clubs or Organizations:   . Attends Archivist Meetings:   Marland Kitchen Marital Status:   Intimate Partner Violence:   . Fear of Current or Ex-Partner:   . Emotionally Abused:   Marland Kitchen Physically Abused:   . Sexually Abused:     FH:  Family History  Problem Relation Age of Onset  . Heart disease Mother        Pacemaker  . Hypertension Father   . Cancer Paternal Grandfather   . Heart attack Neg Hx   . Stroke Neg Hx     Past Medical History:  Diagnosis Date  . Arthritis    "hands, feet" (10/31/2013)  . CHF (congestive heart failure) ([place])   . Chronic bronchitis ([place])   . High cholesterol   . Hypertension   . Obesity   . OSA on CPAP   . Pneumonia 09/2011  . Substance abuse ([place]) 8/31/213   cociane "first time use"  . Tobacco abuse     Current Outpatient Medications  Medication Sig Dispense Refill  . acetaminophen (TYLENOL) 500 MG tablet Take 1,000 mg by mouth every 6 (six) hours as needed for moderate pain or headache.    . albuterol (PROVENTIL HFA) 108 (90 Base) MCG/ACT inhaler Inhale 1-2 puffs into the lungs every 6 (six) hours as needed for wheezing or shortness of breath. 18 g 3  . apixaban (ELIQUIS) 5 MG TABS tablet Take 1 tablet (5 mg total) by mouth 2 (two) times daily. 60 tablet 6  . atorvastatin (LIPITOR) 80 MG tablet Take 1 tablet (80 mg total) by mouth daily at 6 PM. 30 tablet 11  . brimonidine  (ALPHAGAN) 0.2 % ophthalmic solution Place 1 drop into both eyes in the morning and at bedtime.    . carvedilol (COREG) 6.25 MG tablet TAKE 1 AND 1/2 TABLET BY MOUTH TWICE A DAY 90 tablet 5  . hydrALAZINE (APRESOLINE) 25 MG tablet TAKE 1 TABLET (25 MG TOTAL) BY MOUTH 3 (THREE) TIMES DAILY. 270 tablet 1  . OXYGEN Inhale 2  L/min into the lungs continuous.    . potassium chloride SA (KLOR-CON) 20 MEQ tablet TAKE 2 TABLETS (40 MEQ TOTAL) BY MOUTH DAILY. 60 tablet 2  . sildenafil (VIAGRA) 50 MG tablet Take 1 tablet (50 mg total) by mouth daily as needed for erectile dysfunction. 10 tablet 1  . torsemide (DEMADEX) 20 MG tablet Take 4 tablets (80 mg total) by mouth 2 (two) times daily. 240 tablet 2   No current facility-administered medications for this encounter.    Vitals:   06/20/19 1432  BP: 118/72  Pulse: (!) 120  SpO2: 92%  Weight: 129.3 kg (285 lb)   Wt Readings from Last 3 Encounters:  06/20/19 129.3 kg (285 lb)  05/23/19 118.4 kg (261 lb)  05/16/19 118.8 kg (262 lb)    Vitals:   06/20/19 1432  BP: 118/72  Pulse: (!) 120  SpO2: 92%    PHYSICAL EXAM: General:  Chronically ill appearing AAM, that looks much older than actual age. No respiratory difficulty but using [place] HEENT: normal Neck: supple. JVD elevated to jawline . Carotids 2+ bilat; no bruits. No lymphadenopathy or thyromegaly appreciated. Cor: PMI nondisplaced. Irregular rhythm, tachy rate. No rubs, gallops or murmurs. Lungs: clear Abdomen: obese, soft, nontender, mildly distended. No hepatosplenomegaly. No bruits or masses. Good bowel sounds. Extremities: no cyanosis, clubbing, rash, 1+ bilateral LE edema Neuro: alert & oriented x 3, cranial nerves grossly intact. moves all 4 extremities w/o difficulty. Affect pleasant.   ASSESSMENT & PLAN:  1. Acute on Chronic Diastolic CHF with prominent RV failure: TEE in 9/20 showed EF 55%, severe RV dilation with moderately decreased systolic function, D-shaped septum.  RV  failure is in the setting of OSA/OHS.  RVH on ECG. - He is volume overloaded w/ NYHA Class III symptoms, in the setting of new atrial flutter w/ RVR, also poor compliance w/ torsemide.  - Increase torsemide to 100 mg bid x 2 days, then transition to 100 mg qam/ 80 mg qpm - Increase KCl to 60 mEq daily  - Check BMP today and again in 7 days   - Increase Coreg slightly from 9.375 mg >>12.5 mg bid for better rate control  2. OHS/OSA:  CPAP was removed because he stopped using it. Needs another sleep study but does not have the money for it right now. He is using home oxygen at all times.  - Continue home oxygen.  - Trying to get together the funds to get sleep study. Trying to arrange monthly payment agreement  3. HTN:  Well controlled on current regimen  - Increase Coreg to 12.5 mg bid for better rate control  - Continue hydralazine 25 mg tid  4. CAD: Nonobstructive on coronary CTA in 9/20.  - stable w/o CP  - Continue statin.  5. Stroke: L MCA stroke secondary to PFO with paradoxical emboli. PFO with bidirectional flow was found on TEE, suggesting stroke was related to paradoxical emboli.  DVT studies negative.  Atrial fibrillation not visualized at the time, but now in atrial flutter.  - Continue Eliquis 5 mg twice daily  6. PFO: s/p PFO closure by Dr. Burt Knack 05/16/18.  7. New Atrial Flutter w/ RVR: rates in the 120s. BP stable. Mildly symptomatic w/ palpitations and fluid overload - Increase Coreg to 12.5 mg bid - Increase torsemide as outlined above to diurese - Continue Eliquis 5 mg bid - Check TSH, CBC, BMP and Mg level   - He states he has missed a dose or 2 of  Eliquis in the last 30 days, will arrange TEE guided DCCV late next week w/ Dr. Aundra Dubin, once better diuresed.  Discussed w/ paramedicine today. They will f/u closely next week to ensure his wt is down/ he well diuresed ahead of planned DCCV.   F/u in clinic 1-2 weeks post cardioversion. If recurrent atrial flutter post  cardioversion, may consider ablation   Lyda Jester, PA-C 06/20/2019

## 2019-06-20 NOTE — Telephone Encounter (Signed)
Pt called to confirm appt time. Appt time given pt thanked me for the call.

## 2019-06-20 NOTE — H&P (View-Only) (Signed)
Patient ID: Douglas [name]., male   DOB: 08/05/1985, 34 y.o.   MRN: [id number]    Advanced Heart Failure Clinic Note   PCP: Patient, No Pcp Per Primary Cardiologist: Dr Aundra Dubin  Pulmonary: Dr Halford Chessman    HPI: [name] is a 34 y.o. with OHS/OSA, morbid obesity, HTN, diastolic CHF with prominent RV failure.   Admitted in August 2016  to [facility] with volume overload. Diuresed with lasix drip and diamox. Transitioned to lasix 80 mg twice a day. Discharge weight 257 pounds.   Admit to [facility] 3/21 through 04/28/15 with respiratory distress. Required short term intubation. Apparently had not been wearing BiPap. Discharge weight was 259 pounds.   Admitted with AMS in the setting of acute stroke in 9/20. Suspect that he had a cardio-embolic CVA. On TEE,  swirling with spontaneous contrast noted in the severely dilated RA. There was a moderate-sized PFO with bidirectional shunting. Strongly suspect this was the source of his CVA. Atrial fibrillation has not been seen, no LA appendage thrombus.  Coronary CTA in 9/20 showed nonobstructive mild CAD.  Discharged on Eliquis.  He was referred to Dr. Burt Knack for PFO closure, which was performed 05/16/19.   His last f/u was w/ Dr. Aundra Dubin in March. He was mildly volume overloaded at the time and torsemide was increased to 80 mg bid.   He returns today for followup of OHS/OSA, diastolic CHF with RV failure, and CVA with PFO.  Right-sided weakness and dysarthria with stroke are almost totally resolved.  He is using home oxygen but does not have CPAP.  He is trying to get a sleep study but has to pay a lot out of pocket for it and does not have the money right now.   He is here today  w/ paramedicine. Wt is up 16 lb since last OV. BP stable but tachycardic in the 120s. EKG shows new atrial flutter. He has felt palpitations for a few days now. Dyspnea slightly worse w/ activity, compared to usual baseline but he has not had to increase his supp O2, remains on 2L. He admits  that he missed a dose of two of torsemide recently. Does not eat much. Notes early satiety. He does drink a lot of fluids. He believes he has missed a dose of two of Eliquis in the last month.  He has been contacted by sleep clinic regarding sleep study. He says he can get study done if he is able to make monthly payments.   - ECHO 03/20/2014 EF 60-65% - ECHO 8/18: EF 55-60%, grade II diastolic dydsfunction, D-shaped septum suggesting RV pressure/volume overload, severe RV dilation with mild-moderately decreased RV systolic function, unable to estimate PA pressure.  - ECHO 5/20: EF 55-60%, moderate RV dilation with mildly decreased systolic function, unable to estimate PASP, D-shaped interventricular septum.  - TEE 9/20: EF 55%, severely dilated RV with moderately decreased systolic function, D-shaped septum, large PFO with bidirectional shunting, severe RAE with spontaneous contrast in RA.  - Coronary CTA (9/20): Nonobstructive disease.   Review of systems complete and found to be negative unless listed in HPI.   SH:  Social History   Socioeconomic History  . Marital status: Single    Spouse name: Not on file  . Number of children: 2  . Years of education: Not on file  . Highest education level: Not on file  Occupational History  . Occupation: N/A    Employer: NOT EMPLOYED  Tobacco Use  . Smoking status: Former  Smoker    Packs/day: 1.50    Years: 3.00    Pack years: 4.50    Types: Cigarettes    Quit date: 10/31/2013    Years since quitting: 5.6  . Smokeless tobacco: Never Used  Substance and Sexual Activity  . Alcohol use: No    Alcohol/week: 1.0 standard drinks    Types: 1 Cans of beer per week  . Drug use: No    Types: Cocaine    Comment: 10/31/2013 "might use cocaine once/month" - Has not used since 2015.   Marland Kitchen Sexual activity: Never  Other Topics Concern  . Not on file  Social History Narrative  . Not on file   Social Determinants of Health   Financial Resource Strain:     . Difficulty of Paying Living Expenses:   Food Insecurity:   . Worried About Charity fundraiser in the Last Year:   . Arboriculturist in the Last Year:   Transportation Needs:   . Film/video editor (Medical):   Marland Kitchen Lack of Transportation (Non-Medical):   Physical Activity:   . Days of Exercise per Week:   . Minutes of Exercise per Session:   Stress:   . Feeling of Stress :   Social Connections:   . Frequency of Communication with Friends and Family:   . Frequency of Social Gatherings with Friends and Family:   . Attends Religious Services:   . Active Member of Clubs or Organizations:   . Attends Archivist Meetings:   Marland Kitchen Marital Status:   Intimate Partner Violence:   . Fear of Current or Ex-Partner:   . Emotionally Abused:   Marland Kitchen Physically Abused:   . Sexually Abused:     FH:  Family History  Problem Relation Age of Onset  . Heart disease Mother        Pacemaker  . Hypertension Father   . Cancer Paternal Grandfather   . Heart attack Neg Hx   . Stroke Neg Hx     Past Medical History:  Diagnosis Date  . Arthritis    "hands, feet" (10/31/2013)  . CHF (congestive heart failure) ([place])   . Chronic bronchitis ([place])   . High cholesterol   . Hypertension   . Obesity   . OSA on CPAP   . Pneumonia 09/2011  . Substance abuse ([place]) 8/31/213   cociane "first time use"  . Tobacco abuse     Current Outpatient Medications  Medication Sig Dispense Refill  . acetaminophen (TYLENOL) 500 MG tablet Take 1,000 mg by mouth every 6 (six) hours as needed for moderate pain or headache.    . albuterol (PROVENTIL HFA) 108 (90 Base) MCG/ACT inhaler Inhale 1-2 puffs into the lungs every 6 (six) hours as needed for wheezing or shortness of breath. 18 g 3  . apixaban (ELIQUIS) 5 MG TABS tablet Take 1 tablet (5 mg total) by mouth 2 (two) times daily. 60 tablet 6  . atorvastatin (LIPITOR) 80 MG tablet Take 1 tablet (80 mg total) by mouth daily at 6 PM. 30 tablet 11  . brimonidine  (ALPHAGAN) 0.2 % ophthalmic solution Place 1 drop into both eyes in the morning and at bedtime.    . carvedilol (COREG) 6.25 MG tablet TAKE 1 AND 1/2 TABLET BY MOUTH TWICE A DAY 90 tablet 5  . hydrALAZINE (APRESOLINE) 25 MG tablet TAKE 1 TABLET (25 MG TOTAL) BY MOUTH 3 (THREE) TIMES DAILY. 270 tablet 1  . OXYGEN Inhale 2  L/min into the lungs continuous.    . potassium chloride SA (KLOR-CON) 20 MEQ tablet TAKE 2 TABLETS (40 MEQ TOTAL) BY MOUTH DAILY. 60 tablet 2  . sildenafil (VIAGRA) 50 MG tablet Take 1 tablet (50 mg total) by mouth daily as needed for erectile dysfunction. 10 tablet 1  . torsemide (DEMADEX) 20 MG tablet Take 4 tablets (80 mg total) by mouth 2 (two) times daily. 240 tablet 2   No current facility-administered medications for this encounter.    Vitals:   06/20/19 1432  BP: 118/72  Pulse: (!) 120  SpO2: 92%  Weight: 129.3 kg (285 lb)   Wt Readings from Last 3 Encounters:  06/20/19 129.3 kg (285 lb)  05/23/19 118.4 kg (261 lb)  05/16/19 118.8 kg (262 lb)    Vitals:   06/20/19 1432  BP: 118/72  Pulse: (!) 120  SpO2: 92%    PHYSICAL EXAM: General:  Chronically ill appearing AAM, that looks much older than actual age. No respiratory difficulty but using [place] HEENT: normal Neck: supple. JVD elevated to jawline . Carotids 2+ bilat; no bruits. No lymphadenopathy or thyromegaly appreciated. Cor: PMI nondisplaced. Irregular rhythm, tachy rate. No rubs, gallops or murmurs. Lungs: clear Abdomen: obese, soft, nontender, mildly distended. No hepatosplenomegaly. No bruits or masses. Good bowel sounds. Extremities: no cyanosis, clubbing, rash, 1+ bilateral LE edema Neuro: alert & oriented x 3, cranial nerves grossly intact. moves all 4 extremities w/o difficulty. Affect pleasant.   ASSESSMENT & PLAN:  1. Acute on Chronic Diastolic CHF with prominent RV failure: TEE in 9/20 showed EF 55%, severe RV dilation with moderately decreased systolic function, D-shaped septum.  RV  failure is in the setting of OSA/OHS.  RVH on ECG. - He is volume overloaded w/ NYHA Class III symptoms, in the setting of new atrial flutter w/ RVR, also poor compliance w/ torsemide.  - Increase torsemide to 100 mg bid x 2 days, then transition to 100 mg qam/ 80 mg qpm - Increase KCl to 60 mEq daily  - Check BMP today and again in 7 days   - Increase Coreg slightly from 9.375 mg >>12.5 mg bid for better rate control  2. OHS/OSA:  CPAP was removed because he stopped using it. Needs another sleep study but does not have the money for it right now. He is using home oxygen at all times.  - Continue home oxygen.  - Trying to get together the funds to get sleep study. Trying to arrange monthly payment agreement  3. HTN:  Well controlled on current regimen  - Increase Coreg to 12.5 mg bid for better rate control  - Continue hydralazine 25 mg tid  4. CAD: Nonobstructive on coronary CTA in 9/20.  - stable w/o CP  - Continue statin.  5. Stroke: L MCA stroke secondary to PFO with paradoxical emboli. PFO with bidirectional flow was found on TEE, suggesting stroke was related to paradoxical emboli.  DVT studies negative.  Atrial fibrillation not visualized at the time, but now in atrial flutter.  - Continue Eliquis 5 mg twice daily  6. PFO: s/p PFO closure by Dr. Burt Knack 05/16/18.  7. New Atrial Flutter w/ RVR: rates in the 120s. BP stable. Mildly symptomatic w/ palpitations and fluid overload - Increase Coreg to 12.5 mg bid - Increase torsemide as outlined above to diurese - Continue Eliquis 5 mg bid - Check TSH, CBC, BMP and Mg level   - He states he has missed a dose or 2 of  Eliquis in the last 30 days, will arrange TEE guided DCCV late next week w/ Dr. Aundra Dubin, once better diuresed.  Discussed w/ paramedicine today. They will f/u closely next week to ensure his wt is down/ he well diuresed ahead of planned DCCV.   F/u in clinic 1-2 weeks post cardioversion. If recurrent atrial flutter post  cardioversion, may consider ablation   Lyda Jester, PA-C 06/20/2019

## 2019-06-21 ENCOUNTER — Telehealth (HOSPITAL_COMMUNITY): Payer: Self-pay | Admitting: *Deleted

## 2019-06-21 NOTE — Telephone Encounter (Signed)
Left VM for pt to call back to schedule TEE/DCCV.

## 2019-06-25 ENCOUNTER — Other Ambulatory Visit (HOSPITAL_COMMUNITY): Payer: Self-pay

## 2019-06-25 NOTE — Progress Notes (Signed)
Paramedicine Encounter    Patient ID: Douglas Madura., male    DOB: September 20, 1985, 34 y.o.   MRN: [id number]   Patient Care Team: Patient, No Pcp Per as PCP - General (General Practice) Larey Dresser, MD as PCP - Advanced Heart Failure (Cardiology)  Patient Active Problem List   Diagnosis Date Noted   PFO (patent foramen ovale) 05/16/2019   CVA (cerebral vascular accident) ([place]) 10/14/2018   Morbid obesity ([place]) 06/17/2015   Essential hypertension 04/14/2015   Hyperkalemia 04/14/2015   OSA (obstructive sleep apnea)    Difficult intravenous access    Chronic diastolic CHF (congestive heart failure), NYHA class 3 ([place]) 09/13/2014   Cor pulmonale, chronic ([place]) 03/19/2014   OSA treated with BiPAP     Current Outpatient Medications:    acetaminophen (TYLENOL) 500 MG tablet, Take 1,000 mg by mouth every 6 (six) hours as needed for moderate pain or headache., Disp: , Rfl:    albuterol (PROVENTIL HFA) 108 (90 Base) MCG/ACT inhaler, Inhale 1-2 puffs into the lungs every 6 (six) hours as needed for wheezing or shortness of breath., Disp: 18 g, Rfl: 3   apixaban (ELIQUIS) 5 MG TABS tablet, Take 1 tablet (5 mg total) by mouth 2 (two) times daily., Disp: 60 tablet, Rfl: 6   atorvastatin (LIPITOR) 80 MG tablet, Take 1 tablet (80 mg total) by mouth daily at 6 PM., Disp: 30 tablet, Rfl: 11   brimonidine (ALPHAGAN) 0.2 % ophthalmic solution, Place 1 drop into both eyes in the morning and at bedtime., Disp: , Rfl:    carvedilol (COREG) 12.5 MG tablet, Take 1 tablet (12.5 mg total) by mouth 2 (two) times daily with a meal., Disp: 60 tablet, Rfl: 6   hydrALAZINE (APRESOLINE) 25 MG tablet, TAKE 1 TABLET (25 MG TOTAL) BY MOUTH 3 (THREE) TIMES DAILY., Disp: 270 tablet, Rfl: 1   OXYGEN, Inhale 2 L/min into the lungs continuous., Disp: , Rfl:    potassium chloride SA (KLOR-CON) 20 MEQ tablet, Take 3 tablets (60 mEq total) by mouth daily., Disp: 90 tablet, Rfl: 3   sildenafil  (VIAGRA) 50 MG tablet, Take 1 tablet (50 mg total) by mouth daily as needed for erectile dysfunction., Disp: 10 tablet, Rfl: 1   torsemide (DEMADEX) 20 MG tablet, Take 5 tablets (100 mg total) by mouth in the morning AND 4 tablets (80 mg total) every evening., Disp: 270 tablet, Rfl: 2 Allergies  Allergen Reactions   Lisinopril Cough      Social History   Socioeconomic History   Marital status: Single    Spouse name: Not on file   Number of children: 2   Years of education: Not on file   Highest education level: Not on file  Occupational History   Occupation: N/A    Employer: NOT EMPLOYED  Tobacco Use   Smoking status: Former Smoker    Packs/day: 1.50    Years: 3.00    Pack years: 4.50    Types: Cigarettes    Quit date: 10/31/2013    Years since quitting: 5.6   Smokeless tobacco: Never Used  Substance and Sexual Activity   Alcohol use: No    Alcohol/week: 1.0 standard drinks    Types: 1 Cans of beer per week   Drug use: No    Types: Cocaine    Comment: 10/31/2013 "might use cocaine once/month" - Has not used since 2015.    Sexual activity: Never  Other Topics Concern   Not on file  Social  History Narrative   Not on file   Social Determinants of Health   Financial Resource Strain:    Difficulty of Paying Living Expenses:   Food Insecurity:    Worried About Charity fundraiser in the Last Year:    Arboriculturist in the Last Year:   Transportation Needs:    Film/video editor (Medical):    Lack of Transportation (Non-Medical):   Physical Activity:    Days of Exercise per Week:    Minutes of Exercise per Session:   Stress:    Feeling of Stress :   Social Connections:    Frequency of Communication with Friends and Family:    Frequency of Social Gatherings with Friends and Family:    Attends Religious Services:    Active Member of Clubs or Organizations:    Attends Music therapist:    Marital Status:   Intimate  Partner Violence:    Fear of Current or Ex-Partner:    Emotionally Abused:    Physically Abused:    Sexually Abused:     Physical Exam Cardiovascular:     Rate and Rhythm: Tachycardia present. Rhythm irregular.  Pulmonary:     Effort: Pulmonary effort is normal.     Breath sounds: Normal breath sounds.  Musculoskeletal:        General: Normal range of motion.     Right lower leg: Edema present.     Left lower leg: Edema present.  Skin:    General: Skin is warm and dry.     Capillary Refill: Capillary refill takes less than 2 seconds.  Neurological:     Mental Status: He is alert and oriented to person, place, and time.  Psychiatric:        Mood and Affect: Mood normal.         Future Appointments  Date Time Provider [place]  07/25/2019  8:30 AM [facility] PA/NP [facility] None    BP 114/75 (BP Location: Left Arm, Patient Position: Sitting, Cuff Size: Normal)    Pulse (!) 121    Resp 16    Wt 277 lb 1.6 oz (125.7 kg)    SpO2 97%    BMI 47.56 kg/m   Weight yesterday- 277 lb Last visit weight- 285 lb  Douglas [name] was seen at home today and reported feeling generally well. He denied chest pain, SOB, headache, dizziness, orthopnea, fever or cough but did complain of feeling the same "jumpiness" as he reported in the clinic last week. He was noted to be tachycardic at 120 bpm which was consistent with his rate on Friday. His weight has come down 8 lb since Friday had he reported his breathing was less labored. He reported being compliant with his medications so we did a test with each pill. He was able to tell me when he takes each pill and if they are supposed to be taken daily or multiple times per day. A refill of Eliquis was ordered and will be delivered today. Nothing further was necessary at this time. I will follow up next week.   [name], EMT 06/25/19  ACTION: Home visit completed Next visit planned for 1 week

## 2019-07-02 ENCOUNTER — Other Ambulatory Visit (HOSPITAL_COMMUNITY): Payer: Self-pay | Admitting: *Deleted

## 2019-07-02 ENCOUNTER — Other Ambulatory Visit (HOSPITAL_COMMUNITY): Payer: Self-pay

## 2019-07-02 DIAGNOSIS — I4892 Unspecified atrial flutter: Secondary | ICD-10-CM

## 2019-07-02 NOTE — Progress Notes (Signed)
Paramedicine Encounter    Patient ID: Douglas Madura., male    DOB: [birth year], 34 y.o.   MRN: [id number]   Patient Care Team: Patient, No Pcp Per as PCP - General ([place]) Larey Dresser, MD as PCP - Advanced Heart Failure (Cardiology)  Patient Active Problem List   Diagnosis Date Noted  . PFO (patent foramen ovale) 05/16/2019  . CVA (cerebral vascular accident) ([place]) 10/14/2018  . Morbid obesity ([place]) 06/17/2015  . Essential hypertension 04/14/2015  . Hyperkalemia 04/14/2015  . OSA (obstructive sleep apnea)   . Difficult intravenous access   . Chronic diastolic CHF (congestive heart failure), NYHA class 3 ([place]) 09/13/2014  . Cor pulmonale, chronic ([place]) 03/19/2014  . OSA treated with BiPAP     Current Outpatient Medications:  .  acetaminophen (TYLENOL) 500 MG tablet, Take 1,000 mg by mouth every 6 (six) hours as needed for moderate pain or headache., Disp: , Rfl:  .  albuterol (PROVENTIL HFA) 108 (90 Base) MCG/ACT inhaler, Inhale 1-2 puffs into the lungs every 6 (six) hours as needed for wheezing or shortness of breath., Disp: 18 g, Rfl: 3 .  apixaban (ELIQUIS) 5 MG TABS tablet, Take 1 tablet (5 mg total) by mouth 2 (two) times daily., Disp: 60 tablet, Rfl: 6 .  atorvastatin (LIPITOR) 80 MG tablet, Take 1 tablet (80 mg total) by mouth daily at 6 PM., Disp: 30 tablet, Rfl: 11 .  carvedilol (COREG) 12.5 MG tablet, Take 1 tablet (12.5 mg total) by mouth 2 (two) times daily with a meal., Disp: 60 tablet, Rfl: 6 .  hydrALAZINE (APRESOLINE) 25 MG tablet, TAKE 1 TABLET (25 MG TOTAL) BY MOUTH 3 (THREE) TIMES DAILY., Disp: 270 tablet, Rfl: 1 .  OXYGEN, Inhale 2 L/min into the lungs continuous., Disp: , Rfl:  .  potassium chloride SA (KLOR-CON) 20 MEQ tablet, Take 3 tablets (60 mEq total) by mouth daily., Disp: 90 tablet, Rfl: 3 .  torsemide (DEMADEX) 20 MG tablet, Take 5 tablets (100 mg total) by mouth in the morning AND 4 tablets (80 mg total) every evening., Disp: 270  tablet, Rfl: 2 .  brimonidine (ALPHAGAN) 0.2 % ophthalmic solution, Place 1 drop into both eyes in the morning and at bedtime., Disp: , Rfl:  .  sildenafil (VIAGRA) 50 MG tablet, Take 1 tablet (50 mg total) by mouth daily as needed for erectile dysfunction. (Patient not taking: Reported on 06/25/2019), Disp: 10 tablet, Rfl: 1 Allergies  Allergen Reactions  . Lisinopril Cough      Social History   Socioeconomic History  . Marital status: Single    Spouse name: Not on file  . Number of children: 2  . Years of education: Not on file  . Highest education level: Not on file  Occupational History  . Occupation: N/A    Employer: NOT EMPLOYED  Tobacco Use  . Smoking status: Former Smoker    Packs/day: 1.50    Years: 3.00    Pack years: 4.50    Types: Cigarettes    Quit date: 10/31/2013    Years since quitting: 5.6  . Smokeless tobacco: Never Used  Substance and Sexual Activity  . Alcohol use: No    Alcohol/week: 1.0 standard drinks    Types: 1 Cans of beer per week  . Drug use: No    Types: Cocaine    Comment: 10/31/2013 "might use cocaine once/month" - Has not used since 2015.   Marland Kitchen Sexual activity: Never  Other Topics Concern  .  Not on file  Social History Narrative  . Not on file   Social Determinants of Health   Financial Resource Strain:   . Difficulty of Paying Living Expenses:   Food Insecurity:   . Worried About Charity fundraiser in the Last Year:   . Arboriculturist in the Last Year:   Transportation Needs:   . Film/video editor (Medical):   Marland Kitchen Lack of Transportation (Non-Medical):   Physical Activity:   . Days of Exercise per Week:   . Minutes of Exercise per Session:   Stress:   . Feeling of Stress :   Social Connections:   . Frequency of Communication with Friends and Family:   . Frequency of Social Gatherings with Friends and Family:   . Attends Religious Services:   . Active Member of Clubs or Organizations:   . Attends Archivist  Meetings:   Marland Kitchen Marital Status:   Intimate Partner Violence:   . Fear of Current or Ex-Partner:   . Emotionally Abused:   Marland Kitchen Physically Abused:   . Sexually Abused:     Physical Exam Cardiovascular:     Rate and Rhythm: Regular rhythm. Tachycardia present.     Pulses: Normal pulses.  Pulmonary:     Effort: Pulmonary effort is normal.     Breath sounds: Normal breath sounds.  Musculoskeletal:        General: Normal range of motion.     Right lower leg: Edema present.     Left lower leg: Edema present.  Skin:    General: Skin is warm and dry.     Capillary Refill: Capillary refill takes less than 2 seconds.  Neurological:     Mental Status: He is alert and oriented to person, place, and time.  Psychiatric:        Mood and Affect: Mood normal.         Future Appointments  Date Time Provider [place]  07/25/2019  8:30 AM [facility] PA/NP [facility] None    BP 129/87 (BP Location: Left Arm, Patient Position: Sitting, Cuff Size: Normal)   Pulse (!) 121   Resp 16   Wt 280 lb 6.4 oz (127.2 kg)   SpO2 99%   BMI 48.13 kg/m   Weight yesterday-  Last visit weight- 277.1 lb  Douglas [name] was seen at home today and reported feeling "jumpy" but otherwise fine. He denied chest pain, SOB, headache, dizziness, orthopnea, fever or cough over the past week. He stated he has been compliant with his medications since we last saw each other and his weight has remained relatively stable. His medications were verified and I confirmed that he was taking everything correctly. While I was there I helped get him scheduled for a COVID-19 test and cardioversion. I spoke with his sister to confirm he would have a ride and she was agreeable to taking him to both. I will follow up next week.   [name], EMT 07/02/19  ACTION: Home visit completed Next visit planned for 1 week

## 2019-07-11 ENCOUNTER — Telehealth (HOSPITAL_COMMUNITY): Payer: Self-pay

## 2019-07-11 NOTE — Telephone Encounter (Signed)
I called Mr Kerth to schedule an appointment. He did not answer so I left a message requesting he call me back.   [name], EMT 07/11/19

## 2019-07-12 ENCOUNTER — Other Ambulatory Visit (HOSPITAL_COMMUNITY)
Admission: RE | Admit: 2019-07-12 | Discharge: 2019-07-12 | Disposition: A | Discharge status: Home / Self Care | Payer: Medicaid Other | Source: Ambulatory Visit | Attending: Cardiology | Admitting: Cardiology

## 2019-07-12 ENCOUNTER — Telehealth (HOSPITAL_COMMUNITY): Payer: Self-pay

## 2019-07-12 DIAGNOSIS — Z01812 Encounter for preprocedural laboratory examination: Secondary | ICD-10-CM | POA: Insufficient documentation

## 2019-07-12 DIAGNOSIS — Z20822 Contact with and (suspected) exposure to covid-19: Secondary | ICD-10-CM | POA: Insufficient documentation

## 2019-07-12 NOTE — Telephone Encounter (Signed)
I called Douglas [name] to see if he was available for a home visit today. He stated he was out with his sister and would not be able to meet me today. He has a cardioversion on Monday and had COVID-19 testing today. He was reminded of his procedure and confirmed that he would be there. I will follow up next week.   [name], EMT 07/12/19

## 2019-07-13 LAB — SARS CORONAVIRUS 2 (TAT 6-24 HRS): SARS Coronavirus 2: NEGATIVE

## 2019-07-13 NOTE — Progress Notes (Signed)
Per the lab, the covid results should be resulted by 12:00pm today. 

## 2019-07-14 NOTE — Anesthesia Preprocedure Evaluation (Addendum)
Anesthesia Evaluation  Patient identified by MRN, date of birth, ID band Patient awake    Reviewed: Allergy & Precautions, NPO status , Patient's Chart, lab work & pertinent test results  Airway Mallampati: III       Dental  (+) Dental Advisory Given   Pulmonary sleep apnea, Continuous Positive Airway Pressure Ventilation and Oxygen sleep apnea , COPD,  COPD inhaler and oxygen dependent, former smoker,    Pulmonary exam normal        Cardiovascular hypertension, Pt. on home beta blockers and Pt. on medications +CHF  Normal cardiovascular exam+ dysrhythmias Atrial Fibrillation   ECG: a-flutter, rate 122  ECHO: 1. Left ventricular ejection fraction, by estimation, is 50 to 55%. The left ventricle has low normal function. Left ventricular endocardial border not optimally defined to evaluate regional wall motion. 2. Right ventricular systolic function is moderately reduced. The right ventricular size is moderately enlarged. 3. Amplatzer atrial septal closure device is present. It appears well-seated with no obvious residual shunt. 4. Right atrial size was mildly dilated. 5. Limited echo.   Neuro/Psych CVA    GI/Hepatic negative GI ROS, Neg liver ROS,   Endo/Other  Morbid obesity  Renal/GU negative Renal ROS     Musculoskeletal negative musculoskeletal ROS (+)   Abdominal (+) + obese,   Peds  Hematology HLD   Anesthesia Other Findings A-FLUTTER  Reproductive/Obstetrics                          Anesthesia Physical Anesthesia Plan  ASA: IV  Anesthesia Plan: MAC   Post-op Pain Management:    Induction: Intravenous  PONV Risk Score and Plan: 1 and Propofol infusion and Treatment may vary due to age or medical condition  Airway Management Planned: Nasal Cannula  Additional Equipment:   Intra-op Plan:   Post-operative Plan:   Informed Consent: I have reviewed the patients History and  Physical, chart, labs and discussed the procedure including the risks, benefits and alternatives for the proposed anesthesia with the patient or authorized representative who has indicated his/her understanding and acceptance.     Dental advisory given  Plan Discussed with: CRNA  Anesthesia Plan Comments:         Anesthesia Quick Evaluation

## 2019-07-15 ENCOUNTER — Ambulatory Visit (HOSPITAL_COMMUNITY)
Admission: RE | Admit: 2019-07-15 | Discharge: 2019-07-15 | Disposition: A | Discharge status: Home / Self Care | Payer: Medicaid Other | Attending: Cardiology | Admitting: Cardiology

## 2019-07-15 ENCOUNTER — Ambulatory Visit (HOSPITAL_BASED_OUTPATIENT_CLINIC_OR_DEPARTMENT_OTHER): Payer: Medicaid Other

## 2019-07-15 ENCOUNTER — Encounter (HOSPITAL_COMMUNITY): Payer: Self-pay | Admitting: Cardiology

## 2019-07-15 ENCOUNTER — Encounter (HOSPITAL_COMMUNITY)
Admission: RE | Disposition: A | Discharge status: Home / Self Care | Payer: Self-pay | Source: Home / Self Care | Attending: Cardiology

## 2019-07-15 ENCOUNTER — Ambulatory Visit (HOSPITAL_COMMUNITY): Payer: Medicaid Other | Admitting: Anesthesiology

## 2019-07-15 ENCOUNTER — Other Ambulatory Visit: Payer: Self-pay

## 2019-07-15 DIAGNOSIS — E7800 Pure hypercholesterolemia, unspecified: Secondary | ICD-10-CM | POA: Insufficient documentation

## 2019-07-15 DIAGNOSIS — I4892 Unspecified atrial flutter: Secondary | ICD-10-CM

## 2019-07-15 DIAGNOSIS — Z6841 Body Mass Index (BMI) 40.0 and over, adult: Secondary | ICD-10-CM | POA: Insufficient documentation

## 2019-07-15 DIAGNOSIS — G4733 Obstructive sleep apnea (adult) (pediatric): Secondary | ICD-10-CM | POA: Insufficient documentation

## 2019-07-15 DIAGNOSIS — Z79899 Other long term (current) drug therapy: Secondary | ICD-10-CM | POA: Insufficient documentation

## 2019-07-15 DIAGNOSIS — I5033 Acute on chronic diastolic (congestive) heart failure: Secondary | ICD-10-CM | POA: Diagnosis not present

## 2019-07-15 DIAGNOSIS — Z8673 Personal history of transient ischemic attack (TIA), and cerebral infarction without residual deficits: Secondary | ICD-10-CM | POA: Insufficient documentation

## 2019-07-15 DIAGNOSIS — I2510 Atherosclerotic heart disease of native coronary artery without angina pectoris: Secondary | ICD-10-CM | POA: Insufficient documentation

## 2019-07-15 DIAGNOSIS — Z7901 Long term (current) use of anticoagulants: Secondary | ICD-10-CM | POA: Insufficient documentation

## 2019-07-15 DIAGNOSIS — Z87891 Personal history of nicotine dependence: Secondary | ICD-10-CM | POA: Insufficient documentation

## 2019-07-15 DIAGNOSIS — I110 Hypertensive heart disease with heart failure: Secondary | ICD-10-CM | POA: Diagnosis present

## 2019-07-15 HISTORY — PX: TEE WITHOUT CARDIOVERSION: SHX5443

## 2019-07-15 HISTORY — PX: CARDIOVERSION: SHX1299

## 2019-07-15 LAB — GLUCOSE, CAPILLARY: Glucose-Capillary: 94 mg/dL (ref 70–99)

## 2019-07-15 SURGERY — ECHOCARDIOGRAM, TRANSESOPHAGEAL
Anesthesia: Monitor Anesthesia Care

## 2019-07-15 MED ORDER — PROPOFOL 10 MG/ML IV BOLUS
INTRAVENOUS | Status: DC | PRN
  Administered 2019-07-15: 20 mg via INTRAVENOUS
  Administered 2019-07-15: 40 ug/kg/min via INTRAVENOUS
  Administered 2019-07-15: 20 mg via INTRAVENOUS

## 2019-07-15 MED ORDER — SODIUM CHLORIDE 0.9 % IV SOLN: INTRAVENOUS | Status: DC

## 2019-07-15 MED ORDER — SODIUM CHLORIDE 0.9 % IV SOLN
INTRAVENOUS | Status: AC | PRN
  Administered 2019-07-15: 500 mL via INTRAMUSCULAR

## 2019-07-15 MED ORDER — PHENYLEPHRINE HCL (PRESSORS) 10 MG/ML IV SOLN
INTRAVENOUS | Status: DC | PRN
Start: 2019-07-15 — End: 2019-07-15
  Administered 2019-07-15 (×2): 40 ug via INTRAVENOUS

## 2019-07-15 MED ORDER — LACTATED RINGERS IV SOLN: INTRAVENOUS | Status: DC | PRN

## 2019-07-15 NOTE — CV Procedure (Signed)
Procedure: TEE  Sedation: Per anesthesiology.  Indication: Atrial flutter.  Findings: Please see echo section for full report. No LA appendage thrombus.   Proceed to DCCV.   [name] 07/15/2019 8:10 AM

## 2019-07-15 NOTE — Progress Notes (Signed)
Blood sample drawn for iSTAT labs. Last lab results were 5/27. ISTAT results came back as "bad sample." At this time, MD Aundra Dubin said that results were not not necessary to start procedure therefore a new sample was not drawn.

## 2019-07-15 NOTE — Interval H&P Note (Signed)
History and Physical Interval Note:  07/15/2019 7:42 AM  Douglas D Benard Jr.  has presented today for surgery, with the diagnosis of A-FLUTTER.  The various methods of treatment have been discussed with the patient and family. After consideration of risks, benefits and other options for treatment, the patient has consented to  Procedure(s): TRANSESOPHAGEAL ECHOCARDIOGRAM (TEE) (N/A) CARDIOVERSION (N/A) as a surgical intervention.  The patient's history has been reviewed, patient examined, no change in status, stable for surgery.  I have reviewed the patient's chart and labs.  Questions were answered to the patient's satisfaction.     Sascha Palma [employer]

## 2019-07-15 NOTE — Anesthesia Postprocedure Evaluation (Signed)
Anesthesia Post Note  Patient: Douglas [name].  Procedure(s) Performed: TRANSESOPHAGEAL ECHOCARDIOGRAM (TEE) (N/A ) CARDIOVERSION (N/A )     Patient location during evaluation: PACU Anesthesia Type: MAC Level of consciousness: awake Pain management: pain level controlled Vital Signs Assessment: post-procedure vital signs reviewed and stable Respiratory status: spontaneous breathing, nonlabored ventilation, respiratory function stable and patient connected to nasal cannula oxygen Cardiovascular status: stable and blood pressure returned to baseline Postop Assessment: no apparent nausea or vomiting Anesthetic complications: no   No complications documented.  Last Vitals:  Vitals:   07/15/19 0940 07/15/19 0955  BP: (!) 117/93 93/64  Pulse: 81 77  Resp: 18 18  Temp:  36.5 C  SpO2: 95% 96%    Last Pain:  Vitals:   07/15/19 0940  TempSrc:   PainSc: 0-No pain                 Douglas [name] P Daxon Kyne

## 2019-07-15 NOTE — Anesthesia Procedure Notes (Signed)
Procedure Name: General with mask airway Date/Time: 07/15/2019 7:50 AM Performed by: Lavell Luster, CRNA Pre-anesthesia Checklist: Patient identified, Emergency Drugs available, Suction available, Patient being monitored and Timeout performed Patient Re-evaluated:Patient Re-evaluated prior to induction Oxygen Delivery Method: Circle system utilized Preoxygenation: Pre-oxygenation with 100% oxygen Ventilation: Mask ventilation with difficulty Placement Confirmation: positive ETCO2 and breath sounds checked- equal and bilateral Dental Injury: Injury to tongue  Comments: Assisted ventilation by myself and Dr Royce Macadamia with oral airway in place due to low o2 sats, pt bit tongue and c/o being sore after case.  Dr Royce Macadamia aware.  Henderson Cloud, CRNA

## 2019-07-15 NOTE — Procedures (Signed)
Electrical Cardioversion Procedure Note Douglas [name] [id number] [birth year]  Procedure: Electrical Cardioversion Indications:  Atrial Flutter  Procedure Details Consent: Risks of procedure as well as the alternatives and risks of each were explained to the (patient/caregiver).  Consent for procedure obtained. Time Out: Verified patient identification, verified procedure, site/side was marked, verified correct patient position, special equipment/implants available, medications/allergies/relevent history reviewed, required imaging and test results available.  Performed  Patient placed on cardiac monitor, pulse oximetry, supplemental oxygen as necessary.  Sedation given: Propofol per anesthesiology Pacer pads placed anterior and posterior chest.  Cardioverted 1 time(s).  Cardioverted at 150J.  Evaluation Findings: Post procedure EKG shows: NSR Complications: Post-procedure hypoxemia recovered with suction and oxygen.  Patient did tolerate procedure well.   Douglas [name] 07/15/2019, 8:10 AM

## 2019-07-15 NOTE — Transfer of Care (Signed)
Immediate Anesthesia Transfer of Care Note  Patient: Douglas [name].  Procedure(s) Performed: TRANSESOPHAGEAL ECHOCARDIOGRAM (TEE) (N/A ) CARDIOVERSION (N/A )  Patient Location: PACU  Anesthesia Type:General  Level of Consciousness: awake, alert , oriented and patient cooperative  Airway & Oxygen Therapy: Patient Spontanous Breathing  Post-op Assessment: Post -op Vital signs reviewed and stable  Post vital signs: stable  Last Vitals:  Vitals Value Taken Time  BP 114/85 07/15/19 0841  Temp 36.5 C 07/15/19 0840  Pulse 79 07/15/19 0851  Resp 32 07/15/19 0851  SpO2 90 % 07/15/19 0851  Vitals shown include unvalidated device data.  Last Pain:  Vitals:   07/15/19 0840  TempSrc:   PainSc: 0-No pain         Complications: No complications documented.

## 2019-07-15 NOTE — Progress Notes (Signed)
°  Echocardiogram 2D Echocardiogram has been performed.  Douglas [name] 07/15/2019, 8:39 AM

## 2019-07-15 NOTE — Discharge Instructions (Signed)
General Anesthesia, Adult, Care After This sheet gives you information about how to care for yourself after your procedure. Your health care provider may also give you more specific instructions. If you have problems or questions, contact your health care provider. What can I expect after the procedure? After the procedure, the following side effects are common:  Pain or discomfort at the IV site.  Nausea.  Vomiting.  Sore throat.  Trouble concentrating.  Feeling cold or chills.  Weak or tired.  Sleepiness and fatigue.  Soreness and body aches. These side effects can affect parts of the body that were not involved in surgery. Follow these instructions at home:  For at least 24 hours after the procedure:  Have a responsible adult stay with you. It is important to have someone help care for you until you are awake and alert.  Rest as needed.  Do not: ? Participate in activities in which you could fall or become injured. ? Drive. ? Use heavy machinery. ? Drink alcohol. ? Take sleeping pills or medicines that cause drowsiness. ? Make important decisions or sign legal documents. ? Take care of children on your own. Eating and drinking  Follow any instructions from your health care provider about eating or drinking restrictions.  When you feel hungry, start by eating small amounts of foods that are soft and easy to digest (bland), such as toast. Gradually return to your regular diet.  Drink enough fluid to keep your urine pale yellow.  If you vomit, rehydrate by drinking water, juice, or clear broth. General instructions  If you have sleep apnea, surgery and certain medicines can increase your risk for breathing problems. Follow instructions from your health care provider about wearing your sleep device: ? Anytime you are sleeping, including during daytime naps. ? While taking prescription pain medicines, sleeping medicines, or medicines that make you drowsy.  Return to  your normal activities as told by your health care provider. Ask your health care provider what activities are safe for you.  Take over-the-counter and prescription medicines only as told by your health care provider.  If you smoke, do not smoke without supervision.  Keep all follow-up visits as told by your health care provider. This is important. Contact a health care provider if:  You have nausea or vomiting that does not get better with medicine.  You cannot eat or drink without vomiting.  You have pain that does not get better with medicine.  You are unable to pass urine.  You develop a skin rash.  You have a fever.  You have redness around your IV site that gets worse. Get help right away if:  You have difficulty breathing.  You have chest pain.  You have blood in your urine or stool, or you vomit blood. Summary  After the procedure, it is common to have a sore throat or nausea. It is also common to feel tired.  Have a responsible adult stay with you for the first 24 hours after general anesthesia. It is important to have someone help care for you until you are awake and alert.  When you feel hungry, start by eating small amounts of foods that are soft and easy to digest (bland), such as toast. Gradually return to your regular diet.  Drink enough fluid to keep your urine pale yellow.  Return to your normal activities as told by your health care provider. Ask your health care provider what activities are safe for you. This information is not   intended to replace advice given to you by your health care provider. Make sure you discuss any questions you have with your health care provider. Document Revised: 01/13/2017 Document Reviewed: 08/26/2016 Elsevier Patient Education  [place]. Transesophageal Echocardiogram  Transesophageal echocardiogram (TEE) is a test that uses sound waves (echocardiogram) to produce very clear, detailed images of the heart. TEE is  done by passing a flexible tube down the esophagus. The heart is located in front of the esophagus. TEE may be done:  To check how well your heart valves are working.  To check for any abnormal growth or tumor  To look for blood clots  To check for infection of the lining of the heart (endocarditis).  To evaluate the dividing wall (septum) of the heart and check for a hole that did not close after birth (patent foramen ovale or atrial septal defect).  To help diagnose a tear in the wall of the blood vessels (aortic dissection).  To look at the heart shape, size, and function. Any changes can be associated with certain conditions, including heart failure, aneurysm, and coronary heart disease, CAD.  During cardiac valve surgery. This allows the surgeon to assess the valve repair before closing the chest.  During a variety of other cardiac procedures to guide positioning of catheters.  To monitor your heart's response to IV fluids or medicine. TEE is usually not a painful procedure. You may feel the probe press against the back of the throat. The probe does not enter the trachea and does not affect your breathing. Tell a health care provider about:  Any allergies you have.  All medicines you are taking, including vitamins, herbs, eye drops, creams, and over-the-counter medicines.  Any problems you or family members have had with anesthetic medicines.  Any blood disorders you have.  Any surgeries you have had.  Any medical conditions you have.  Any swallowing difficulties.  Whether you have or have had a blockage of the esophagus (esophageal obstruction).  Whether you are pregnant or may be pregnant. What are the risks? Generally, this is a safe procedure. However, problems may occur, including:  Damage to other structures or organs.  A tear of the esophagus (esophageal rupture).  Irregular heart beat (arrhythmia).  Hoarse voice or difficulty swallowing.  Bleeding  (hemorrhage). What happens before the procedure? Staying hydrated Follow instructions from your health care provider about hydration, which may include:  Up to 3 hours before the procedure - you may continue to drink clear liquids, such as water, clear fruit juice, black coffee, and plain tea. Eating and drinking Follow instructions from your health care provider about eating and drinking, which may include:  8 hours before the procedure - stop eating heavy meals or foods such as meat, fried foods, or fatty foods.  6 hours before the procedure - stop eating light meals or foods, such as toast or cereal.  6 hours before the procedure - stop drinking milk or drinks that contain milk.  3 hours before the procedure - stop drinking clear liquids. General instructions  You will need to remove any dentures or dental retainers.  Plan to have someone take you home from the hospital or clinic.  Plan to have a responsible adult care for you for at least 24 hours after you leave the hospital or clinic. This is important.  Ask your health care provider about: ? Changing or stopping your normal medicines. This is important if you take diabetes medicines or blood thinners. ?  Taking over-the-counter medicines, vitamins, herbs, and supplements. ? Taking medicines such as aspirin and ibuprofen. These medicines can thin your blood. Do not take these medicines unless your health care provider tells you to take them. What happens during the procedure?  To reduce your risk of infection: ? Your health care team will wash or sanitize their hands. ? Hair may be removed from the surgical area. ? Your skin will be washed with soap.  An IV will be inserted into one of your veins.  You will be given one or both of the following: ? A medicine to help you relax (sedative). ? A medicine to be sprayed or gargled in order to numb the back of your throat (local anesthetic).  Your blood pressure, heart rate,  and breathing (vital signs) will be monitored during the procedure.  You may be asked to lay on your left side.  A bite block will be placed in your mouth to keep you from biting the tube during the procedure.  The tip of the TEE probe will be placed into the back of your mouth. You will be asked to swallow. This helps to pass the tip of the probe into the esophagus.  Once the tip of the probe is in the correct area, your health care provider will take pictures of the heart.  The probe and bite block will be removed when the procedure is done. The procedure may vary among health care providers and hospitals What happens after the procedure?  Your blood pressure, heart rate, breathing rate, and blood oxygen level will be monitored until the medicines you were given have worn off.  When you first wake up, your throat may feel slightly sore and will probably still feel numb. This will improve slowly over time. You will not be allowed to eat or drink until the numbness has gone away.  Do not drive for 24 hours if you received a sedative. Summary  Transesophageal echocardiogram (TEE) is a test that uses sound waves (echocardiogram) to produce very clear, detailed images of the heart.  TEE is done by passing a flexible tube down the esophagus.  Generally, this is a safe procedure. However, problems may occur, including damage to other structures or organs, bleeding, irregular heart beat, and a hoarse voice or trouble swallowing.  Tell your health care provider about any medicines and medical conditions you may have, as some conditions may increase your risk of complications. This information is not intended to replace advice given to you by your health care provider. Make sure you discuss any questions you have with your health care provider. Document Revised: 06/21/2016 Document Reviewed: 04/08/2016 Elsevier Patient Education  [place]. Hospital doctor  cardioversion is the delivery of a jolt of electricity to restore a normal rhythm to the heart. A rhythm that is too fast or is not regular keeps the heart from pumping well. In this procedure, sticky patches or metal paddles are placed on the chest to deliver electricity to the heart from a device. This procedure may be done in an emergency if:  There is low or no blood pressure as a result of the heart rhythm.  Normal rhythm must be restored as fast as possible to protect the brain and heart from further damage.  It may save a life. This may also be a scheduled procedure for irregular or fast heart rhythms that are not immediately life-threatening. Tell a health care provider about:  Any allergies you have.  All medicines you are taking, including vitamins, herbs, eye drops, creams, and over-the-counter medicines.  Any problems you or family members have had with anesthetic medicines.  Any blood disorders you have.  Any surgeries you have had.  Any medical conditions you have.  Whether you are pregnant or may be pregnant. What are the risks? Generally, this is a safe procedure. However, problems may occur, including:  Allergic reactions to medicines.  A blood clot that breaks free and travels to other parts of your body.  The possible return of an abnormal heart rhythm within hours or days after the procedure.  Your heart stopping (cardiac arrest). This is rare. What happens before the procedure? Medicines  Your health care provider may have you start taking: ? Blood-thinning medicines (anticoagulants) so your blood does not clot as easily. ? Medicines to help stabilize your heart rate and rhythm.  Ask your health care provider about: ? Changing or stopping your regular medicines. This is especially important if you are taking diabetes medicines or blood thinners. ? Taking medicines such as aspirin and ibuprofen. These medicines can thin your blood. Do not take these  medicines unless your health care provider tells you to take them. ? Taking over-the-counter medicines, vitamins, herbs, and supplements. General instructions  Follow instructions from your health care provider about eating or drinking restrictions.  Plan to have someone take you home from the hospital or clinic.  If you will be going home right after the procedure, plan to have someone with you for 24 hours.  Ask your health care provider what steps will be taken to help prevent infection. These may include washing your skin with a germ-killing soap. What happens during the procedure?   An IV will be inserted into one of your veins.  Sticky patches (electrodes) or metal paddles may be placed on your chest.  You will be given a medicine to help you relax (sedative).  An electrical shock will be delivered. The procedure may vary among health care providers and hospitals. What can I expect after the procedure?  Your blood pressure, heart rate, breathing rate, and blood oxygen level will be monitored until you leave the hospital or clinic.  Your heart rhythm will be watched to make sure it does not change.  You may have some redness on the skin where the shocks were given. Follow these instructions at home:  Do not drive for 24 hours if you were given a sedative during your procedure.  Take over-the-counter and prescription medicines only as told by your health care provider.  Ask your health care provider how to check your pulse. Check it often.  Rest for 48 hours after the procedure or as told by your health care provider.  Avoid or limit your caffeine use as told by your health care provider.  Keep all follow-up visits as told by your health care provider. This is important. Contact a health care provider if:  You feel like your heart is beating too quickly or your pulse is not regular.  You have a serious muscle cramp that does not go away. Get help right away  if:  You have discomfort in your chest.  You are dizzy or you feel faint.  You have trouble breathing or you are short of breath.  Your speech is slurred.  You have trouble moving an arm or leg on one side of your body.  Your fingers or toes turn cold or blue. Summary  Electrical cardioversion is the  delivery of a jolt of electricity to restore a normal rhythm to the heart.  This procedure may be done right away in an emergency or may be a scheduled procedure if the condition is not an emergency.  Generally, this is a safe procedure.  After the procedure, check your pulse often as told by your health care provider. This information is not intended to replace advice given to you by your health care provider. Make sure you discuss any questions you have with your health care provider. Document Revised: 08/13/2018 Document Reviewed: 08/13/2018 Elsevier Patient Education  [place].

## 2019-07-18 ENCOUNTER — Other Ambulatory Visit (HOSPITAL_COMMUNITY): Payer: Self-pay

## 2019-07-18 NOTE — Progress Notes (Signed)
Paramedicine Encounter    Patient ID: Douglas Madura., male    DOB: June 27, 1985, 34 y.o.   MRN: [id number]   Patient Care Team: Patient, No Pcp Per as PCP - General ([place]) Douglas Dresser, MD as PCP - Advanced Heart Failure (Cardiology)  Patient Active Problem List   Diagnosis Date Noted  . PFO (patent foramen ovale) 05/16/2019  . CVA (cerebral vascular accident) ([place]) 10/14/2018  . Morbid obesity ([place]) 06/17/2015  . Essential hypertension 04/14/2015  . Hyperkalemia 04/14/2015  . OSA (obstructive sleep apnea)   . Difficult intravenous access   . Chronic diastolic CHF (congestive heart failure), NYHA class 3 ([place]) 09/13/2014  . Cor pulmonale, chronic ([place]) 03/19/2014  . OSA treated with BiPAP     Current Outpatient Medications:  .  albuterol (PROVENTIL HFA) 108 (90 Base) MCG/ACT inhaler, Inhale 1-2 puffs into the lungs every 6 (six) hours as needed for wheezing or shortness of breath., Disp: 18 g, Rfl: 3 .  apixaban (ELIQUIS) 5 MG TABS tablet, Take 1 tablet (5 mg total) by mouth 2 (two) times daily., Disp: 60 tablet, Rfl: 6 .  atorvastatin (LIPITOR) 80 MG tablet, Take 1 tablet (80 mg total) by mouth daily at 6 PM. (Patient taking differently: Take 80 mg by mouth at bedtime. ), Disp: 30 tablet, Rfl: 11 .  brimonidine (ALPHAGAN) 0.2 % ophthalmic solution, Place 1 drop into both eyes in the morning and at bedtime., Disp: , Rfl:  .  carvedilol (COREG) 12.5 MG tablet, Take 1 tablet (12.5 mg total) by mouth 2 (two) times daily with a meal., Disp: 60 tablet, Rfl: 6 .  hydrALAZINE (APRESOLINE) 25 MG tablet, TAKE 1 TABLET (25 MG TOTAL) BY MOUTH 3 (THREE) TIMES DAILY., Disp: 270 tablet, Rfl: 1 .  OXYGEN, Inhale 2 L/min into the lungs as needed (shortness of breath). , Disp: , Rfl:  .  potassium chloride SA (KLOR-CON) 20 MEQ tablet, Take 3 tablets (60 mEq total) by mouth daily., Disp: 90 tablet, Rfl: 3 .  sildenafil (VIAGRA) 50 MG tablet, Take 1 tablet (50 mg total) by mouth  daily as needed for erectile dysfunction. (Patient not taking: Reported on 06/25/2019), Disp: 10 tablet, Rfl: 1 .  torsemide (DEMADEX) 20 MG tablet, Take 5 tablets (100 mg total) by mouth in the morning AND 4 tablets (80 mg total) every evening. (Patient taking differently: Take 60 mg by mouth in the morning), Disp: 270 tablet, Rfl: 2 Allergies  Allergen Reactions  . Lisinopril Cough      Social History   Socioeconomic History  . Marital status: Single    Spouse name: Not on file  . Number of children: 2  . Years of education: Not on file  . Highest education level: Not on file  Occupational History  . Occupation: N/A    Employer: NOT EMPLOYED  Tobacco Use  . Smoking status: Former Smoker    Packs/day: 1.50    Years: 3.00    Pack years: 4.50    Types: Cigarettes    Quit date: 10/31/2013    Years since quitting: 5.7  . Smokeless tobacco: Never Used  Substance and Sexual Activity  . Alcohol use: No    Alcohol/week: 1.0 standard drink    Types: 1 Cans of beer per week  . Drug use: No    Types: Cocaine    Comment: 10/31/2013 "might use cocaine once/month" - Has not used since 2015.   Marland Kitchen Sexual activity: Never  Other Topics Concern  .  Not on file  Social History Narrative  . Not on file   Social Determinants of Health   Financial Resource Strain:   . Difficulty of Paying Living Expenses:   Food Insecurity:   . Worried About Charity fundraiser in the Last Year:   . Arboriculturist in the Last Year:   Transportation Needs:   . Film/video editor (Medical):   Marland Kitchen Lack of Transportation (Non-Medical):   Physical Activity:   . Days of Exercise per Week:   . Minutes of Exercise per Session:   Stress:   . Feeling of Stress :   Social Connections:   . Frequency of Communication with Friends and Family:   . Frequency of Social Gatherings with Friends and Family:   . Attends Religious Services:   . Active Member of Clubs or Organizations:   . Attends Archivist  Meetings:   Marland Kitchen Marital Status:   Intimate Partner Violence:   . Fear of Current or Ex-Partner:   . Emotionally Abused:   Marland Kitchen Physically Abused:   . Sexually Abused:     Physical Exam Cardiovascular:     Rate and Rhythm: Normal rate and regular rhythm.     Pulses: Normal pulses.  Pulmonary:     Effort: Pulmonary effort is normal.     Breath sounds: Normal breath sounds.  Musculoskeletal:        General: Normal range of motion.     Right lower leg: Edema present.     Left lower leg: Edema present.  Skin:    General: Skin is warm and dry.     Capillary Refill: Capillary refill takes less than 2 seconds.  Neurological:     Mental Status: He is alert and oriented to person, place, and time.  Psychiatric:        Mood and Affect: Mood normal.         Future Appointments  Date Time Provider [place]  07/25/2019  8:30 AM [facility] PA/NP [facility] None    BP 124/78 (BP Location: Left Arm, Patient Position: Sitting, Cuff Size: Normal)   Pulse 70   Resp 16   Wt 273 lb (123.8 kg)   SpO2 92%   BMI 46.86 kg/m   Weight yesterday- 274 lb Last visit weight- 270 lb  Douglas [name] was seen at home today and reported feeling well. He denied chest pain, SOB, headache, dizziness, orthopnea, fever or cough over the past week. He stated that since his cardioversion he has not felt jumpy or jittery as he had complained of before. He exhibits BLEE and abdominal swelling but his weight is only up 3 lb since his procedure Monday. We discussed at length about his fluid intake and I showed him again how he can measure it. He was understanding and agreeable. His medications were verified and refills were ordered. I will follow up next week.   Douglas [name], EMT 07/18/19  ACTION: Home visit completed Next visit planned for 1 week

## 2019-07-19 ENCOUNTER — Other Ambulatory Visit (HOSPITAL_COMMUNITY): Payer: Self-pay | Admitting: Cardiology

## 2019-07-24 ENCOUNTER — Telehealth (HOSPITAL_COMMUNITY): Payer: Self-pay

## 2019-07-24 NOTE — Telephone Encounter (Signed)
I called [name] to remind him of his clinic appointment tomorrow. He advised he would be there and would bring his medications. He also stated he wants to discuss his limitations for returning to the workforce so I will bring that up with the provider tomorrow.   [name], EMT 07/24/19

## 2019-07-25 ENCOUNTER — Encounter (HOSPITAL_COMMUNITY): Payer: Medicaid Other

## 2019-07-25 ENCOUNTER — Telehealth (HOSPITAL_COMMUNITY): Payer: Self-pay

## 2019-07-25 NOTE — Telephone Encounter (Signed)
Patient called to cancel his appointment for today stating that he lost the keys to his home. Patient did reschedule appointment for august. Patient did deny any cardiac symptoms. I advised patient to call us if he started experencing anything.

## 2019-08-01 ENCOUNTER — Telehealth (HOSPITAL_COMMUNITY): Payer: Self-pay

## 2019-08-01 NOTE — Telephone Encounter (Signed)
I called Douglas [name] to schedule an appointment. He stated he did not want me to come today but said he would be available tomorrow at 13:00 so we agreed to meet then.   [name], EMT 08/01/19

## 2019-08-02 ENCOUNTER — Telehealth (HOSPITAL_COMMUNITY): Payer: Self-pay

## 2019-08-02 NOTE — Telephone Encounter (Signed)
Mr Maish called me to advise he was not going to be able to keep our appointment for this afternoon because he "had someplace to go." He asked if I could come earlier or later in the day but I already had a full schedule so I advised I would try to meet up with him next week. He was understanding and agreeable.   [name], EMT 08/02/19

## 2019-08-06 ENCOUNTER — Other Ambulatory Visit (HOSPITAL_COMMUNITY): Payer: Self-pay

## 2019-08-06 NOTE — Progress Notes (Signed)
Paramedicine Encounter    Patient ID: Douglas Madura., male    DOB: 14-Apr-1985, 34 y.o.   MRN: [id number]   Patient Care Team: Patient, No Pcp Per as PCP - General ([place]) Larey Dresser, MD as PCP - Advanced Heart Failure (Cardiology)  Patient Active Problem List   Diagnosis Date Noted  . PFO (patent foramen ovale) 05/16/2019  . CVA (cerebral vascular accident) ([place]) 10/14/2018  . Morbid obesity ([place]) 06/17/2015  . Essential hypertension 04/14/2015  . Hyperkalemia 04/14/2015  . OSA (obstructive sleep apnea)   . Difficult intravenous access   . Chronic diastolic CHF (congestive heart failure), NYHA class 3 ([place]) 09/13/2014  . Cor pulmonale, chronic ([place]) 03/19/2014  . OSA treated with BiPAP     Current Outpatient Medications:  .  albuterol (PROVENTIL HFA) 108 (90 Base) MCG/ACT inhaler, Inhale 1-2 puffs into the lungs every 6 (six) hours as needed for wheezing or shortness of breath., Disp: 18 g, Rfl: 3 .  apixaban (ELIQUIS) 5 MG TABS tablet, Take 1 tablet (5 mg total) by mouth 2 (two) times daily., Disp: 60 tablet, Rfl: 6 .  atorvastatin (LIPITOR) 80 MG tablet, Take 1 tablet (80 mg total) by mouth daily at 6 PM. (Patient taking differently: Take 80 mg by mouth at bedtime. ), Disp: 30 tablet, Rfl: 11 .  carvedilol (COREG) 12.5 MG tablet, Take 1 tablet (12.5 mg total) by mouth 2 (two) times daily with a meal., Disp: 60 tablet, Rfl: 6 .  hydrALAZINE (APRESOLINE) 25 MG tablet, TAKE 1 TABLET (25 MG TOTAL) BY MOUTH 3 (THREE) TIMES DAILY., Disp: 270 tablet, Rfl: 3 .  OXYGEN, Inhale 2 L/min into the lungs as needed (shortness of breath). , Disp: , Rfl:  .  potassium chloride SA (KLOR-CON) 20 MEQ tablet, Take 3 tablets (60 mEq total) by mouth daily., Disp: 90 tablet, Rfl: 3 .  torsemide (DEMADEX) 20 MG tablet, Take 5 tablets (100 mg total) by mouth in the morning AND 4 tablets (80 mg total) every evening. (Patient taking differently: Take 60 mg by mouth in the morning), Disp:  270 tablet, Rfl: 2 .  brimonidine (ALPHAGAN) 0.2 % ophthalmic solution, Place 1 drop into both eyes in the morning and at bedtime., Disp: , Rfl:  .  sildenafil (VIAGRA) 50 MG tablet, Take 1 tablet (50 mg total) by mouth daily as needed for erectile dysfunction. (Patient not taking: Reported on 06/25/2019), Disp: 10 tablet, Rfl: 1 Allergies  Allergen Reactions  . Lisinopril Cough      Social History   Socioeconomic History  . Marital status: Single    Spouse name: Not on file  . Number of children: 2  . Years of education: Not on file  . Highest education level: Not on file  Occupational History  . Occupation: N/A    Employer: NOT EMPLOYED  Tobacco Use  . Smoking status: Former Smoker    Packs/day: 1.50    Years: 3.00    Pack years: 4.50    Types: Cigarettes    Quit date: 10/31/2013    Years since quitting: 5.7  . Smokeless tobacco: Never Used  Substance and Sexual Activity  . Alcohol use: No    Alcohol/week: 1.0 standard drink    Types: 1 Cans of beer per week  . Drug use: No    Types: Cocaine    Comment: 10/31/2013 "might use cocaine once/month" - Has not used since 2015.   Marland Kitchen Sexual activity: Never  Other Topics Concern  .  Not on file  Social History Narrative  . Not on file   Social Determinants of Health   Financial Resource Strain:   . Difficulty of Paying Living Expenses:   Food Insecurity:   . Worried About Charity fundraiser in the Last Year:   . Arboriculturist in the Last Year:   Transportation Needs:   . Film/video editor (Medical):   Marland Kitchen Lack of Transportation (Non-Medical):   Physical Activity:   . Days of Exercise per Week:   . Minutes of Exercise per Session:   Stress:   . Feeling of Stress :   Social Connections:   . Frequency of Communication with Friends and Family:   . Frequency of Social Gatherings with Friends and Family:   . Attends Religious Services:   . Active Member of Clubs or Organizations:   . Attends Archivist  Meetings:   Marland Kitchen Marital Status:   Intimate Partner Violence:   . Fear of Current or Ex-Partner:   . Emotionally Abused:   Marland Kitchen Physically Abused:   . Sexually Abused:     Physical Exam Cardiovascular:     Rate and Rhythm: Tachycardia present. Rhythm irregular.     Pulses: Normal pulses.  Pulmonary:     Effort: Pulmonary effort is normal.     Breath sounds: Normal breath sounds.  Musculoskeletal:        General: Normal range of motion.     Right lower leg: No edema.     Left lower leg: No edema.  Skin:    General: Skin is warm and dry.     Capillary Refill: Capillary refill takes less than 2 seconds.  Neurological:     Mental Status: He is alert and oriented to person, place, and time.  Psychiatric:        Mood and Affect: Mood normal.         Future Appointments  Date Time Provider [place]  09/17/2019 12:00 PM [facility] PA/NP [facility] None    BP 111/79 (BP Location: Left Arm, Patient Position: Sitting, Cuff Size: Normal)   Pulse (!) 114   Resp 18   Wt 264 lb (119.7 kg)   SpO2 95%   BMI 45.32 kg/m   Weight yesterday- 266 lb Last visit weight- 273 lb  Douglas [name] was seen at home today and reported feeling generally well. He denied chest pain, SOB, headache, dizziness, orthopnea, fever or cough. He reported being compliant with his medications and his weight has remained stable. His medications were verified but he does not use a pillbox. All medications appeared to be at proper levels for when they were filled.  Upon assessing vital signs Douglas [name] was not initially wearing oxygen and his oxygen saturation was mid to upper 70's. He stated his portable oxygen concentrator was not working. When I turned it on it worked properly for a few minutes but then began showing a "Low Oxygen" alarm. I suggested he get on his home unit until he can contact [facility] about the issue but he advised he no longer has his home unit because he left it at his old apartment when he  moved. I contacted [name] Sours, LCSW, to look into solutions and advised Douglas [name] to continue using the portable oxygen even if the alarm is sounding as his saturations improved to the mid 90's while wearing it. He was understanding and agreeable. I also advised he contact [employer] as soon as possible about the issue  and he agreed.   He was also noted to be tachycardic so I placed him on a 12-lead ECG and he was found to be in an irregular narrow complex tachycardia. The rate and rhythm appeared to be consistent with his previous episode of atrial fibrillation for which he was cardioverted on 6/21. I contacted the clinic and they advised to have him come in on Thursday at 10:30 for a follow up. He was understanding and agreeable. A cab will be sent and he stated he would be ready upon their arrival. I will follow up at this appointment.   [name], EMT 08/06/19  ACTION: Home visit completed Next visit planned for Thursday

## 2019-08-07 ENCOUNTER — Telehealth (HOSPITAL_COMMUNITY): Payer: Self-pay | Admitting: Licensed Clinical Social Worker

## 2019-08-07 ENCOUNTER — Telehealth (HOSPITAL_COMMUNITY): Payer: Self-pay | Admitting: *Deleted

## 2019-08-07 NOTE — Telephone Encounter (Signed)
CSW informed by [employer] that pt left his home concentrator when he moved apartments awhile ago- wanting CSW to follow up with home [place]- to inquire about getting a new one.  CSW called and spoke with [employer] rep.  [employer] rep strongly encourages patient to try and locate the concentrator but states his portable O2 tank can be used as a home concentrator as well by being plugged into the wall.  Will send message to supervisor to see if they can bill Medicaid for another home concentrator but doesn't think so since it is being rented and not owned.  CSW called and informed pt of this- reports he is already aware of how to use his portable concentrator as a home concentrator.  Pt reports no further needs at this time- CSW will continue to follow and assist as needed  Jorge Ny, [facility] Desk#: [phone] Cell#: [phone]

## 2019-08-07 NOTE — Telephone Encounter (Signed)
Patient referred to Clinical Exercise Physiologist via [place] from [place] for guidance and discussion about safe home exercises and/or starting an exercise program. Reached out to patient and interviewed patient for ability to ambulate independently, transportation and financial barriers to exercise. Given patient's limitations and need for physical activity he has been advised to obtain a membership at the downtown [facility] and will have exercise prescription written by [facility] CEP/HWC upon joining. He agrees this will be the best fit for him since his sister has to transport him. He will contact the [employer] today and gather paperwork and materials necessary for joining. Patient will seek CEP/HWC assistance as needed.   Patient agreeable with plan and he was provided contact information for further questions or concerns regarding exercise planning and prescription.   CEP/HWC will continue to follow.   Telephonically time spent with patient: 20 minutes     Douglas Martins, MS, ACSM, [facility] Clinical Exercise Physiologist/ Health and Wellness Coach

## 2019-08-08 ENCOUNTER — Encounter (HOSPITAL_COMMUNITY): Payer: Self-pay

## 2019-08-08 ENCOUNTER — Other Ambulatory Visit (HOSPITAL_COMMUNITY): Payer: Self-pay

## 2019-08-08 ENCOUNTER — Ambulatory Visit (HOSPITAL_COMMUNITY)
Admission: RE | Admit: 2019-08-08 | Discharge: 2019-08-08 | Disposition: A | Discharge status: Home / Self Care | Payer: Medicaid Other | Source: Ambulatory Visit | Attending: Adult Health | Admitting: Adult Health

## 2019-08-08 ENCOUNTER — Other Ambulatory Visit: Payer: Self-pay

## 2019-08-08 VITALS — BP 124/96 | HR 114 | Wt 262.2 lb

## 2019-08-08 DIAGNOSIS — R0683 Snoring: Secondary | ICD-10-CM

## 2019-08-08 DIAGNOSIS — Z7901 Long term (current) use of anticoagulants: Secondary | ICD-10-CM | POA: Insufficient documentation

## 2019-08-08 DIAGNOSIS — Z79899 Other long term (current) drug therapy: Secondary | ICD-10-CM | POA: Diagnosis not present

## 2019-08-08 DIAGNOSIS — Z8774 Personal history of (corrected) congenital malformations of heart and circulatory system: Secondary | ICD-10-CM | POA: Insufficient documentation

## 2019-08-08 DIAGNOSIS — M1990 Unspecified osteoarthritis, unspecified site: Secondary | ICD-10-CM | POA: Insufficient documentation

## 2019-08-08 DIAGNOSIS — I10 Essential (primary) hypertension: Secondary | ICD-10-CM

## 2019-08-08 DIAGNOSIS — Z8249 Family history of ischemic heart disease and other diseases of the circulatory system: Secondary | ICD-10-CM | POA: Insufficient documentation

## 2019-08-08 DIAGNOSIS — Z8673 Personal history of transient ischemic attack (TIA), and cerebral infarction without residual deficits: Secondary | ICD-10-CM | POA: Insufficient documentation

## 2019-08-08 DIAGNOSIS — E662 Morbid (severe) obesity with alveolar hypoventilation: Secondary | ICD-10-CM | POA: Insufficient documentation

## 2019-08-08 DIAGNOSIS — Z6841 Body Mass Index (BMI) 40.0 and over, adult: Secondary | ICD-10-CM | POA: Diagnosis not present

## 2019-08-08 DIAGNOSIS — Z87891 Personal history of nicotine dependence: Secondary | ICD-10-CM | POA: Insufficient documentation

## 2019-08-08 DIAGNOSIS — Z9981 Dependence on supplemental oxygen: Secondary | ICD-10-CM | POA: Insufficient documentation

## 2019-08-08 DIAGNOSIS — I5032 Chronic diastolic (congestive) heart failure: Secondary | ICD-10-CM | POA: Diagnosis not present

## 2019-08-08 DIAGNOSIS — I4892 Unspecified atrial flutter: Secondary | ICD-10-CM | POA: Insufficient documentation

## 2019-08-08 DIAGNOSIS — I110 Hypertensive heart disease with heart failure: Secondary | ICD-10-CM | POA: Insufficient documentation

## 2019-08-08 DIAGNOSIS — I2510 Atherosclerotic heart disease of native coronary artery without angina pectoris: Secondary | ICD-10-CM | POA: Insufficient documentation

## 2019-08-08 DIAGNOSIS — G4733 Obstructive sleep apnea (adult) (pediatric): Secondary | ICD-10-CM

## 2019-08-08 DIAGNOSIS — E7800 Pure hypercholesterolemia, unspecified: Secondary | ICD-10-CM | POA: Diagnosis not present

## 2019-08-08 LAB — BASIC METABOLIC PANEL
Anion gap: 10 (ref 5–15)
BUN: 20 mg/dL (ref 6–20)
CO2: 39 mmol/L — ABNORMAL HIGH (ref 22–32)
Calcium: 8.5 mg/dL — ABNORMAL LOW (ref 8.9–10.3)
Chloride: 89 mmol/L — ABNORMAL LOW (ref 98–111)
Creatinine, Ser: 1.78 mg/dL — ABNORMAL HIGH (ref 0.61–1.24)
GFR calc Af Amer: 56 mL/min — ABNORMAL LOW (ref >60)
GFR calc non Af Amer: 48 mL/min — ABNORMAL LOW (ref >60)
Glucose, Bld: 147 mg/dL — ABNORMAL HIGH (ref 70–99)
Potassium: 4.9 mmol/L (ref 3.5–5.1)
Sodium: 138 mmol/L (ref 135–145)

## 2019-08-08 LAB — CBC
HCT: 57.7 % — ABNORMAL HIGH (ref 39.0–52.0)
Hemoglobin: 16 g/dL (ref 13.0–17.0)
MCH: 28.9 pg (ref 26.0–34.0)
MCHC: 27.7 g/dL — ABNORMAL LOW (ref 30.0–36.0)
MCV: 104.3 fL — ABNORMAL HIGH (ref 80.0–100.0)
Platelets: 112 10*3/uL — ABNORMAL LOW (ref 150–400)
RBC: 5.53 MIL/uL (ref 4.22–5.81)
RDW: 14.6 % (ref 11.5–15.5)
WBC: 5.8 10*3/uL (ref 4.0–10.5)
nRBC: 0 % (ref 0.0–0.2)

## 2019-08-08 MED ORDER — CARVEDILOL 25 MG PO TABS: 25.0000 mg | ORAL_TABLET | Freq: Two times a day (BID) | ORAL | 3 refills | Status: DC

## 2019-08-08 NOTE — Progress Notes (Signed)
Paramedicine Encounter   Patient ID: Douglas [name] , male,   DOB: Dec 25, 1985,33 y.o.,  MRN: [id number]  Mr Petro was seen at the clinic today for a recurrence of atrial fibrillation. Per Darrick Grinder, NP, he is to increase coreg to 25 mg BID and he was scheduled for a cardioversion on Wednesday of next week. I explained the medication change to him and he was understanding. We also went through a [employer] application which he will next to obtain documents before we can move forward but I have made him aware and asked his sister for help. I will follow up on Tuesday next week.   [name], EMT 08/08/2019   ACTION: Next visit planned for 1 week

## 2019-08-08 NOTE — Progress Notes (Addendum)
Patient ID: Douglas Madura., male   DOB: 04/18/1985, 34 y.o.   MRN: [id number]    Advanced Heart Failure Clinic Note   PCP: Patient, No Pcp Per Primary Cardiologist: Dr Aundra Dubin  Pulmonary: Dr Halford Chessman    HPI: Douglas [name] is a 34 y.o. with OHS/OSA, morbid obesity, HTN, diastolic CHF with prominent RV failure.   Admitted in August 2016  to [facility] with volume overload. Diuresed with lasix drip and diamox. Transitioned to lasix 80 mg twice a day. Discharge weight 257 pounds.   Admit to [facility] 3/21 through 04/28/15 with respiratory distress. Required short term intubation. Apparently had not been wearing BiPap. Discharge weight was 259 pounds.   Admitted with AMS in the setting of acute stroke in 9/20. Suspect that he had a cardio-embolic CVA. On TEE,  swirling with spontaneous contrast noted in the severely dilated RA. There was a moderate-sized PFO with bidirectional shunting. Strongly suspect this was the source of his CVA. Atrial fibrillation has not been seen, no LA appendage thrombus.  Coronary CTA in 9/20 showed nonobstructive mild CAD.  Discharged on Eliquis.  He was referred to Dr. Burt Knack for PFO closure, which was performed 05/16/19.   His last f/u was w/ Dr. Aundra Dubin in March. He was mildly volume overloaded at the time and torsemide was increased to 80 mg bid.   He returns today for followup of OHS/OSA, diastolic CHF with RV failure, and CVA with PFO.  Right-sided weakness and dysarthria with stroke are almost totally resolved.  He is using home oxygen but does not have CPAP.  He is trying to get a sleep study but has to pay a lot out of pocket for it and does not have the money right now.   S/P TEE DC-CV on 07/15/19 -->NSR   Per HF Paramedicine Team he was tachycardic 116 bpm on 08/06/19 and at previous visit he had been regular on exam with heart in the 70s.   Today he returns for HF follow up.Overall feeling fine. Remains on 2 liters [place]. Says he only gets SOB if he has fluid. Today he denies  palpitations. Denies SOB/PND/Orthopnea. No bleeding issues. Appetite ok. No fever or chills. Weight at home 264 pounds. Taking all medications. Followed by Paramedicine.     - ECHO 03/20/2014 EF 60-65% - ECHO 8/18: EF 55-60%, grade II diastolic dydsfunction, D-shaped septum suggesting RV pressure/volume overload, severe RV dilation with mild-moderately decreased RV systolic function, unable to estimate PA pressure.  - ECHO 5/20: EF 55-60%, moderate RV dilation with mildly decreased systolic function, unable to estimate PASP, D-shaped interventricular septum.  - TEE 9/20: EF 55%, severely dilated RV with moderately decreased systolic function, D-shaped septum, large PFO with bidirectional shunting, severe RAE with spontaneous contrast in RA.  - Coronary CTA (9/20): Nonobstructive disease.  -07/15/19 S/P  DC-CV TEE--> NSR   Review of systems complete and found to be negative unless listed in HPI.   SH:  Social History   Socioeconomic History  . Marital status: Single    Spouse name: Not on file  . Number of children: 2  . Years of education: Not on file  . Highest education level: Not on file  Occupational History  . Occupation: N/A    Employer: NOT EMPLOYED  Tobacco Use  . Smoking status: Former Smoker    Packs/day: 1.50    Years: 3.00    Pack years: 4.50    Types: Cigarettes    Quit date: 10/31/2013  Years since quitting: 5.7  . Smokeless tobacco: Never Used  Substance and Sexual Activity  . Alcohol use: No    Alcohol/week: 1.0 standard drink    Types: 1 Cans of beer per week  . Drug use: No    Types: Cocaine    Comment: 10/31/2013 "might use cocaine once/month" - Has not used since 2015.   Marland Kitchen Sexual activity: Never  Other Topics Concern  . Not on file  Social History Narrative  . Not on file   Social Determinants of Health   Financial Resource Strain:   . Difficulty of Paying Living Expenses:   Food Insecurity:   . Worried About Charity fundraiser in the Last Year:     . Arboriculturist in the Last Year:   Transportation Needs:   . Film/video editor (Medical):   Marland Kitchen Lack of Transportation (Non-Medical):   Physical Activity:   . Days of Exercise per Week:   . Minutes of Exercise per Session:   Stress:   . Feeling of Stress :   Social Connections:   . Frequency of Communication with Friends and Family:   . Frequency of Social Gatherings with Friends and Family:   . Attends Religious Services:   . Active Member of Clubs or Organizations:   . Attends Archivist Meetings:   Marland Kitchen Marital Status:   Intimate Partner Violence:   . Fear of Current or Ex-Partner:   . Emotionally Abused:   Marland Kitchen Physically Abused:   . Sexually Abused:     FH:  Family History  Problem Relation Age of Onset  . Heart disease Mother        Pacemaker  . Hypertension Father   . Cancer Paternal Grandfather   . Heart attack Neg Hx   . Stroke Neg Hx     Past Medical History:  Diagnosis Date  . Arthritis    "hands, feet" (10/31/2013)  . CHF (congestive heart failure) ([place])   . Chronic bronchitis ([place])   . High cholesterol   . Hypertension   . Obesity   . OSA on CPAP   . Pneumonia 09/2011  . Substance abuse ([place]) 8/31/213   cociane "first time use"  . Tobacco abuse     Current Outpatient Medications  Medication Sig Dispense Refill  . albuterol (PROVENTIL HFA) 108 (90 Base) MCG/ACT inhaler Inhale 1-2 puffs into the lungs every 6 (six) hours as needed for wheezing or shortness of breath. 18 g 3  . apixaban (ELIQUIS) 5 MG TABS tablet Take 1 tablet (5 mg total) by mouth 2 (two) times daily. 60 tablet 6  . atorvastatin (LIPITOR) 80 MG tablet Take 1 tablet (80 mg total) by mouth daily at 6 PM. (Patient taking differently: Take 80 mg by mouth at bedtime. ) 30 tablet 11  . brimonidine (ALPHAGAN) 0.2 % ophthalmic solution Place 1 drop into both eyes in the morning and at bedtime.    . carvedilol (COREG) 12.5 MG tablet Take 1 tablet (12.5 mg total) by mouth 2 (two)  times daily with a meal. 60 tablet 6  . hydrALAZINE (APRESOLINE) 25 MG tablet TAKE 1 TABLET (25 MG TOTAL) BY MOUTH 3 (THREE) TIMES DAILY. 270 tablet 3  . OXYGEN Inhale 2 L/min into the lungs as needed (shortness of breath).     . potassium chloride SA (KLOR-CON) 20 MEQ tablet Take 3 tablets (60 mEq total) by mouth daily. 90 tablet 3  . sildenafil (VIAGRA) 50 MG tablet  Take 1 tablet (50 mg total) by mouth daily as needed for erectile dysfunction. (Patient not taking: Reported on 06/25/2019) 10 tablet 1  . torsemide (DEMADEX) 20 MG tablet Take 5 tablets (100 mg total) by mouth in the morning AND 4 tablets (80 mg total) every evening. (Patient taking differently: Take 60 mg by mouth in the morning) 270 tablet 2   No current facility-administered medications for this encounter.    Vitals:   08/08/19 1040  BP: (!) 124/96  Pulse: (!) 114  SpO2: 94%  Weight: 118.9 kg   Wt Readings from Last 3 Encounters:  08/08/19 118.9 kg  08/06/19 119.7 kg  07/18/19 123.8 kg    Vitals:   08/08/19 1040  BP: (!) 124/96  Pulse: (!) 114  SpO2: 94%    PHYSICAL EXAM: General:   No resp difficulty HEENT: normal Neck: supple. no JVD. Carotids 2+ bilat; no bruits. No lymphadenopathy or thryomegaly appreciated. Cor: PMI nondisplaced. Tachy Regular rate & rhythm. No rubs, gallops or murmurs. Lungs: clear on 2 liters.  Abdomen: soft, nontender, nondistended. No hepatosplenomegaly. No bruits or masses. Good bowel sounds. Extremities: no cyanosis, clubbing, rash, edema Neuro: alert & orientedx3, cranial nerves grossly intact. moves all 4 extremities w/o difficulty. Affect pleasant  EKG - A flutter 114 bpm   ASSESSMENT & PLAN:  1. Chronic Diastolic CHF with prominent RV failure: TEE in 9/20 showed EF 55%, severe RV dilation with moderately decreased systolic function, D-shaped septum.  RV failure is in the setting of OSA/OHS.  RVH on ECG. - NYHA III. Volume stable. Continue torsemide 100 mg qam/ 80 mg qpm. -  Increase coreg to 25 mg twice a day.  - Check BMET.   2. OHS/OSA:  CPAP was removed because he stopped using it. Needs another sleep study but does not have the money for it right now. He is using home oxygen at all times.  - Continue home oxygen.  - We are trying to determine his co-pay to obtain sleep study. Given A fib, I think we need to try and get  another sleep study.  3. HTN:   - Elevated. Increase Coreg to 25 mg twice a day.   - Continue hydralazine 25 mg tid  4. CAD: Nonobstructive on coronary CTA in 9/20.  - stable w/o CP  - Continue statin.  5. Stroke: L MCA stroke secondary to PFO with paradoxical emboli. PFO with bidirectional flow was found on TEE, suggesting stroke was related to paradoxical emboli.  DVT studies negative.  Atrial fibrillation not visualized at the time, but now in atrial flutter.  - Continue Eliquis 5 mg twice daily  6. PFO: s/p PFO closure by Dr. Burt Knack 05/16/18.  7. New Atrial Flutter w/ RVR:  S/P DC-CV 07/15/19-->NSR  - Increase carvedilol to 25 mg twice a day.  - Continue Eliquis 5 mg bid - Set up DC-CV. He has been taking eliquis.  8. Obesity Body mass index is 45.01 kg/m. Discussed portion control.   -Set up DC-CV -Check CBC, BMET - Increase coreg 25 mg twice a day.  - Refer to A fib clininc for ablation.   Follow up in 6 weeks.    Darrick Grinder, NP 08/08/2019

## 2019-08-08 NOTE — Patient Instructions (Signed)
INCREASE Carvedilol to 25 mg, one tab twice daily  Labs today We will only contact you if something comes back abnormal or we need to make some changes. Otherwise no news is good news!  Your physician has recommended that you have a Cardioversion (DCCV). Electrical Cardioversion uses a jolt of electricity to your heart either through paddles or wired patches attached to your chest. This is a controlled, usually prescheduled, procedure. Defibrillation is done under light anesthesia in the hospital, and you usually go home the day of the procedure. This is done to get your heart back into a normal rhythm. You are not awake for the procedure. Please see the instruction sheet given to you today.   You have been referred to [facility], [place] [address] [phone] -they will be in touch with you for a appointment  Keep follow up as scheduled in 4-6 weeks.

## 2019-08-08 NOTE — H&P (View-Only) (Signed)
Patient ID: Douglas [name]., male   DOB: 09-19-1985, 34 y.o.   MRN: [id number]    Advanced Heart Failure Clinic Note   PCP: Patient, No Pcp Per Primary Cardiologist: Dr Aundra Dubin  Pulmonary: Dr Halford Chessman    HPI: [name] is a 34 y.o. with OHS/OSA, morbid obesity, HTN, diastolic CHF with prominent RV failure.   Admitted in August 2016  to [facility] with volume overload. Diuresed with lasix drip and diamox. Transitioned to lasix 80 mg twice a day. Discharge weight 257 pounds.   Admit to [facility] 3/21 through 04/28/15 with respiratory distress. Required short term intubation. Apparently had not been wearing BiPap. Discharge weight was 259 pounds.   Admitted with AMS in the setting of acute stroke in 9/20. Suspect that he had a cardio-embolic CVA. On TEE,  swirling with spontaneous contrast noted in the severely dilated RA. There was a moderate-sized PFO with bidirectional shunting. Strongly suspect this was the source of his CVA. Atrial fibrillation has not been seen, no LA appendage thrombus.  Coronary CTA in 9/20 showed nonobstructive mild CAD.  Discharged on Eliquis.  He was referred to Dr. Burt Knack for PFO closure, which was performed 05/16/19.   His last f/u was w/ Dr. Aundra Dubin in March. He was mildly volume overloaded at the time and torsemide was increased to 80 mg bid.   He returns today for followup of OHS/OSA, diastolic CHF with RV failure, and CVA with PFO.  Right-sided weakness and dysarthria with stroke are almost totally resolved.  He is using home oxygen but does not have CPAP.  He is trying to get a sleep study but has to pay a lot out of pocket for it and does not have the money right now.   S/P TEE DC-CV on 07/15/19 -->NSR   Per HF Paramedicine Team he was tachycardic 116 bpm on 08/06/19 and at previous visit he had been regular on exam with heart in the 70s.   Today he returns for HF follow up.Overall feeling fine. Remains on 2 liters [place]. Says he only gets SOB if he has fluid. Today he denies  palpitations. Denies SOB/PND/Orthopnea. No bleeding issues. Appetite ok. No fever or chills. Weight at home 264 pounds. Taking all medications. Followed by Paramedicine.     - ECHO 03/20/2014 EF 60-65% - ECHO 8/18: EF 55-60%, grade II diastolic dydsfunction, D-shaped septum suggesting RV pressure/volume overload, severe RV dilation with mild-moderately decreased RV systolic function, unable to estimate PA pressure.  - ECHO 5/20: EF 55-60%, moderate RV dilation with mildly decreased systolic function, unable to estimate PASP, D-shaped interventricular septum.  - TEE 9/20: EF 55%, severely dilated RV with moderately decreased systolic function, D-shaped septum, large PFO with bidirectional shunting, severe RAE with spontaneous contrast in RA.  - Coronary CTA (9/20): Nonobstructive disease.  -07/15/19 S/P  DC-CV TEE--> NSR   Review of systems complete and found to be negative unless listed in HPI.   SH:  Social History   Socioeconomic History  . Marital status: Single    Spouse name: Not on file  . Number of children: 2  . Years of education: Not on file  . Highest education level: Not on file  Occupational History  . Occupation: N/A    Employer: NOT EMPLOYED  Tobacco Use  . Smoking status: Former Smoker    Packs/day: 1.50    Years: 3.00    Pack years: 4.50    Types: Cigarettes    Quit date: 10/31/2013  Years since quitting: 5.7  . Smokeless tobacco: Never Used  Substance and Sexual Activity  . Alcohol use: No    Alcohol/week: 1.0 standard drink    Types: 1 Cans of beer per week  . Drug use: No    Types: Cocaine    Comment: 10/31/2013 "might use cocaine once/month" - Has not used since 2015.   Marland Kitchen Sexual activity: Never  Other Topics Concern  . Not on file  Social History Narrative  . Not on file   Social Determinants of Health   Financial Resource Strain:   . Difficulty of Paying Living Expenses:   Food Insecurity:   . Worried About Charity fundraiser in the Last Year:     . Arboriculturist in the Last Year:   Transportation Needs:   . Film/video editor (Medical):   Marland Kitchen Lack of Transportation (Non-Medical):   Physical Activity:   . Days of Exercise per Week:   . Minutes of Exercise per Session:   Stress:   . Feeling of Stress :   Social Connections:   . Frequency of Communication with Friends and Family:   . Frequency of Social Gatherings with Friends and Family:   . Attends Religious Services:   . Active Member of Clubs or Organizations:   . Attends Archivist Meetings:   Marland Kitchen Marital Status:   Intimate Partner Violence:   . Fear of Current or Ex-Partner:   . Emotionally Abused:   Marland Kitchen Physically Abused:   . Sexually Abused:     FH:  Family History  Problem Relation Age of Onset  . Heart disease Mother        Pacemaker  . Hypertension Father   . Cancer Paternal Grandfather   . Heart attack Neg Hx   . Stroke Neg Hx     Past Medical History:  Diagnosis Date  . Arthritis    "hands, feet" (10/31/2013)  . CHF (congestive heart failure) ([place])   . Chronic bronchitis ([place])   . High cholesterol   . Hypertension   . Obesity   . OSA on CPAP   . Pneumonia 09/2011  . Substance abuse ([place]) 8/31/213   cociane "first time use"  . Tobacco abuse     Current Outpatient Medications  Medication Sig Dispense Refill  . albuterol (PROVENTIL HFA) 108 (90 Base) MCG/ACT inhaler Inhale 1-2 puffs into the lungs every 6 (six) hours as needed for wheezing or shortness of breath. 18 g 3  . apixaban (ELIQUIS) 5 MG TABS tablet Take 1 tablet (5 mg total) by mouth 2 (two) times daily. 60 tablet 6  . atorvastatin (LIPITOR) 80 MG tablet Take 1 tablet (80 mg total) by mouth daily at 6 PM. (Patient taking differently: Take 80 mg by mouth at bedtime. ) 30 tablet 11  . brimonidine (ALPHAGAN) 0.2 % ophthalmic solution Place 1 drop into both eyes in the morning and at bedtime.    . carvedilol (COREG) 12.5 MG tablet Take 1 tablet (12.5 mg total) by mouth 2 (two)  times daily with a meal. 60 tablet 6  . hydrALAZINE (APRESOLINE) 25 MG tablet TAKE 1 TABLET (25 MG TOTAL) BY MOUTH 3 (THREE) TIMES DAILY. 270 tablet 3  . OXYGEN Inhale 2 L/min into the lungs as needed (shortness of breath).     . potassium chloride SA (KLOR-CON) 20 MEQ tablet Take 3 tablets (60 mEq total) by mouth daily. 90 tablet 3  . sildenafil (VIAGRA) 50 MG tablet  Take 1 tablet (50 mg total) by mouth daily as needed for erectile dysfunction. (Patient not taking: Reported on 06/25/2019) 10 tablet 1  . torsemide (DEMADEX) 20 MG tablet Take 5 tablets (100 mg total) by mouth in the morning AND 4 tablets (80 mg total) every evening. (Patient taking differently: Take 60 mg by mouth in the morning) 270 tablet 2   No current facility-administered medications for this encounter.    Vitals:   08/08/19 1040  BP: (!) 124/96  Pulse: (!) 114  SpO2: 94%  Weight: 118.9 kg   Wt Readings from Last 3 Encounters:  08/08/19 118.9 kg  08/06/19 119.7 kg  07/18/19 123.8 kg    Vitals:   08/08/19 1040  BP: (!) 124/96  Pulse: (!) 114  SpO2: 94%    PHYSICAL EXAM: General:   No resp difficulty HEENT: normal Neck: supple. no JVD. Carotids 2+ bilat; no bruits. No lymphadenopathy or thryomegaly appreciated. Cor: PMI nondisplaced. Tachy Regular rate & rhythm. No rubs, gallops or murmurs. Lungs: clear on 2 liters.  Abdomen: soft, nontender, nondistended. No hepatosplenomegaly. No bruits or masses. Good bowel sounds. Extremities: no cyanosis, clubbing, rash, edema Neuro: alert & orientedx3, cranial nerves grossly intact. moves all 4 extremities w/o difficulty. Affect pleasant  EKG - A flutter 114 bpm   ASSESSMENT & PLAN:  1. Chronic Diastolic CHF with prominent RV failure: TEE in 9/20 showed EF 55%, severe RV dilation with moderately decreased systolic function, D-shaped septum.  RV failure is in the setting of OSA/OHS.  RVH on ECG. - NYHA III. Volume stable. Continue torsemide 100 mg qam/ 80 mg qpm. -  Increase coreg to 25 mg twice a day.  - Check BMET.   2. OHS/OSA:  CPAP was removed because he stopped using it. Needs another sleep study but does not have the money for it right now. He is using home oxygen at all times.  - Continue home oxygen.  - We are trying to determine his co-pay to obtain sleep study. Given A fib, I think we need to try and get  another sleep study.  3. HTN:   - Elevated. Increase Coreg to 25 mg twice a day.   - Continue hydralazine 25 mg tid  4. CAD: Nonobstructive on coronary CTA in 9/20.  - stable w/o CP  - Continue statin.  5. Stroke: L MCA stroke secondary to PFO with paradoxical emboli. PFO with bidirectional flow was found on TEE, suggesting stroke was related to paradoxical emboli.  DVT studies negative.  Atrial fibrillation not visualized at the time, but now in atrial flutter.  - Continue Eliquis 5 mg twice daily  6. PFO: s/p PFO closure by Dr. Burt Knack 05/16/18.  7. New Atrial Flutter w/ RVR:  S/P DC-CV 07/15/19-->NSR  - Increase carvedilol to 25 mg twice a day.  - Continue Eliquis 5 mg bid - Set up DC-CV. He has been taking eliquis.  8. Obesity Body mass index is 45.01 kg/m. Discussed portion control.   -Set up DC-CV -Check CBC, BMET - Increase coreg 25 mg twice a day.  - Refer to A fib clininc for ablation.   Follow up in 6 weeks.    Darrick Grinder, NP 08/08/2019

## 2019-08-09 ENCOUNTER — Telehealth (HOSPITAL_COMMUNITY): Payer: Self-pay | Admitting: *Deleted

## 2019-08-09 NOTE — Telephone Encounter (Signed)
Followed up with patient regarding [employer] scholarship application and status. Patient awaiting more documentation to provide for the [facility]. His sister is helping him. He will contact me once he has completed the documentation required and is giving a monthly cost for the membership. He and I will then touch base if this is not affordable for him.[facility] will wait to hear from patient and will follow as needed.     Landis Martins, MS, ACSM, [facility] Clinical Exercise Physiologist/ Health and Wellness Coach

## 2019-08-12 ENCOUNTER — Other Ambulatory Visit (HOSPITAL_COMMUNITY): Payer: Self-pay | Admitting: *Deleted

## 2019-08-13 ENCOUNTER — Other Ambulatory Visit (HOSPITAL_COMMUNITY)
Admission: RE | Admit: 2019-08-13 | Discharge: 2019-08-13 | Disposition: A | Discharge status: Home / Self Care | Payer: Medicaid Other | Source: Ambulatory Visit | Attending: Cardiology | Admitting: Cardiology

## 2019-08-13 DIAGNOSIS — Z01812 Encounter for preprocedural laboratory examination: Secondary | ICD-10-CM | POA: Diagnosis present

## 2019-08-13 DIAGNOSIS — Z20822 Contact with and (suspected) exposure to covid-19: Secondary | ICD-10-CM | POA: Diagnosis not present

## 2019-08-13 LAB — SARS CORONAVIRUS 2 (TAT 6-24 HRS): SARS Coronavirus 2: NEGATIVE

## 2019-08-13 NOTE — Progress Notes (Signed)
Pre call done for endo procedure tomorrow. Patient states he is going to get covid tested later this afternoon. States that he will take his blood thinner in the morning before arrival and that he has someone driving him home post procedure. All questions addressed.

## 2019-08-13 NOTE — Anesthesia Preprocedure Evaluation (Addendum)
Anesthesia Evaluation  Patient identified by MRN, date of birth, ID band Patient awake    Reviewed: Allergy & Precautions, NPO status , Patient's Chart, lab work & pertinent test results, reviewed documented beta blocker date and time   History of Anesthesia Complications Negative for: history of anesthetic complications  Airway Mallampati: II  TM Distance: >3 FB Neck ROM: Full   Comment: Fully bearded Dental no notable dental hx. (+) Teeth Intact   Pulmonary asthma , sleep apnea and Continuous Positive Airway Pressure Ventilation , former smoker,  On home O2   Pulmonary exam normal breath sounds clear to auscultation       Cardiovascular hypertension, Pt. on medications and Pt. on home beta blockers +CHF  Normal cardiovascular exam+ dysrhythmias (on Eliquis ) Atrial Fibrillation  Rhythm:Regular Rate:Normal  TEE 07/15/19: EF 55-60%, severe RV dysfunction, severe RVE, D-shaped interventricular septum suggestive of RV  pressure/volume overload, severe RAE, PFO closure device present with small amount of residual leakage    Neuro/Psych CVA (09/2018) negative psych ROS   GI/Hepatic negative GI ROS, Neg liver ROS,   Endo/Other  Morbid obesity  Renal/GU Renal InsufficiencyRenal disease (Cr 1.78)  negative genitourinary   Musculoskeletal  (+) Arthritis ,   Abdominal   Peds  Hematology negative hematology ROS (+)   Anesthesia Other Findings Day of surgery medications reviewed with patient.  Reproductive/Obstetrics negative OB ROS                            Anesthesia Physical Anesthesia Plan  ASA: III  Anesthesia Plan: General   Post-op Pain Management:    Induction: Intravenous  PONV Risk Score and Plan: Treatment may vary due to age or medical condition and Propofol infusion  Airway Management Planned: Mask  Additional Equipment: None  Intra-op Plan:   Post-operative Plan:    Informed Consent: I have reviewed the patients History and Physical, chart, labs and discussed the procedure including the risks, benefits and alternatives for the proposed anesthesia with the patient or authorized representative who has indicated his/her understanding and acceptance.     Dental advisory given  Plan Discussed with:   Anesthesia Plan Comments:        Anesthesia Quick Evaluation

## 2019-08-14 ENCOUNTER — Ambulatory Visit (HOSPITAL_COMMUNITY)
Admission: RE | Admit: 2019-08-14 | Discharge: 2019-08-14 | Disposition: A | Discharge status: Home / Self Care | Payer: Medicaid Other | Attending: Cardiology | Admitting: Cardiology

## 2019-08-14 ENCOUNTER — Telehealth (HOSPITAL_COMMUNITY): Payer: Self-pay | Admitting: *Deleted

## 2019-08-14 ENCOUNTER — Encounter (HOSPITAL_COMMUNITY)
Admission: RE | Disposition: A | Discharge status: Home / Self Care | Payer: Self-pay | Source: Home / Self Care | Attending: Cardiology

## 2019-08-14 ENCOUNTER — Encounter (HOSPITAL_COMMUNITY): Payer: Self-pay | Admitting: Cardiology

## 2019-08-14 ENCOUNTER — Ambulatory Visit (HOSPITAL_COMMUNITY): Payer: Medicaid Other | Admitting: Anesthesiology

## 2019-08-14 ENCOUNTER — Other Ambulatory Visit: Payer: Self-pay

## 2019-08-14 DIAGNOSIS — I4892 Unspecified atrial flutter: Secondary | ICD-10-CM | POA: Insufficient documentation

## 2019-08-14 DIAGNOSIS — G4733 Obstructive sleep apnea (adult) (pediatric): Secondary | ICD-10-CM | POA: Insufficient documentation

## 2019-08-14 DIAGNOSIS — Z79899 Other long term (current) drug therapy: Secondary | ICD-10-CM | POA: Diagnosis not present

## 2019-08-14 DIAGNOSIS — I110 Hypertensive heart disease with heart failure: Secondary | ICD-10-CM | POA: Insufficient documentation

## 2019-08-14 DIAGNOSIS — I2510 Atherosclerotic heart disease of native coronary artery without angina pectoris: Secondary | ICD-10-CM | POA: Insufficient documentation

## 2019-08-14 DIAGNOSIS — E7800 Pure hypercholesterolemia, unspecified: Secondary | ICD-10-CM | POA: Diagnosis not present

## 2019-08-14 DIAGNOSIS — Z9981 Dependence on supplemental oxygen: Secondary | ICD-10-CM | POA: Insufficient documentation

## 2019-08-14 DIAGNOSIS — Z87891 Personal history of nicotine dependence: Secondary | ICD-10-CM | POA: Insufficient documentation

## 2019-08-14 DIAGNOSIS — Z6841 Body Mass Index (BMI) 40.0 and over, adult: Secondary | ICD-10-CM | POA: Insufficient documentation

## 2019-08-14 DIAGNOSIS — I5042 Chronic combined systolic (congestive) and diastolic (congestive) heart failure: Secondary | ICD-10-CM | POA: Insufficient documentation

## 2019-08-14 DIAGNOSIS — Z8673 Personal history of transient ischemic attack (TIA), and cerebral infarction without residual deficits: Secondary | ICD-10-CM | POA: Diagnosis not present

## 2019-08-14 DIAGNOSIS — Z7901 Long term (current) use of anticoagulants: Secondary | ICD-10-CM | POA: Diagnosis not present

## 2019-08-14 HISTORY — PX: CARDIOVERSION: SHX1299

## 2019-08-14 LAB — POCT I-STAT, CHEM 8
BUN: 40 mg/dL — ABNORMAL HIGH (ref 6–20)
Calcium, Ion: 0.88 mmol/L — CL (ref 1.15–1.40)
Chloride: 89 mmol/L — ABNORMAL LOW (ref 98–111)
Creatinine, Ser: 1.8 mg/dL — ABNORMAL HIGH (ref 0.61–1.24)
Glucose, Bld: 102 mg/dL — ABNORMAL HIGH (ref 70–99)
HCT: 60 % — ABNORMAL HIGH (ref 39.0–52.0)
Hemoglobin: 20.4 g/dL — ABNORMAL HIGH (ref 13.0–17.0)
Potassium: 5.5 mmol/L — ABNORMAL HIGH (ref 3.5–5.1)
Sodium: 138 mmol/L (ref 135–145)
TCO2: 45 mmol/L — ABNORMAL HIGH (ref 22–32)

## 2019-08-14 SURGERY — CARDIOVERSION
Anesthesia: General

## 2019-08-14 MED ORDER — TORSEMIDE 20 MG PO TABS: ORAL_TABLET | ORAL | 2 refills | Status: DC

## 2019-08-14 MED ORDER — PHENYLEPHRINE HCL (PRESSORS) 10 MG/ML IV SOLN
INTRAVENOUS | Status: DC | PRN
Start: 2019-08-14 — End: 2019-08-14
  Administered 2019-08-14 (×2): 120 ug via INTRAVENOUS
  Administered 2019-08-14 (×2): 80 ug via INTRAVENOUS

## 2019-08-14 MED ORDER — PROPOFOL 10 MG/ML IV BOLUS
INTRAVENOUS | Status: DC | PRN
  Administered 2019-08-14: 80 mg via INTRAVENOUS

## 2019-08-14 MED ORDER — SODIUM CHLORIDE 0.9 % IV SOLN
INTRAVENOUS | Status: DC | PRN
Start: 2019-08-14 — End: 2019-08-14

## 2019-08-14 MED ORDER — LIDOCAINE 2% (20 MG/ML) 5 ML SYRINGE
INTRAMUSCULAR | Status: DC | PRN
  Administered 2019-08-14: 100 mg via INTRAVENOUS

## 2019-08-14 NOTE — Telephone Encounter (Signed)
Attempted to call pt and Left message to call back, called and discussed w/community paramedic [name], he states he will go by and update pt's meds tomorrow and will call us to sch repeat labs

## 2019-08-14 NOTE — Interval H&P Note (Signed)
History and Physical Interval Note:  08/14/2019 10:16 AM  Douglas D Batty Jr.  has presented today for surgery, with the diagnosis of ALUTTER.  The various methods of treatment have been discussed with the patient and family. After consideration of risks, benefits and other options for treatment, the patient has consented to  Procedure(s): CARDIOVERSION (N/A) as a surgical intervention.  The patient's history has been reviewed, patient examined, no change in status, stable for surgery.  I have reviewed the patient's chart and labs.  Questions were answered to the patient's satisfaction.     Lynise Porr [employer]

## 2019-08-14 NOTE — Discharge Instructions (Signed)
Electrical Cardioversion Electrical cardioversion is the delivery of a jolt of electricity to restore a normal rhythm to the heart. A rhythm that is too fast or is not regular keeps the heart from pumping well. In this procedure, sticky patches or metal paddles are placed on the chest to deliver electricity to the heart from a device. This procedure may be done in an emergency if:  There is low or no blood pressure as a result of the heart rhythm.  Normal rhythm must be restored as fast as possible to protect the brain and heart from further damage.  It may save a life. This may also be a scheduled procedure for irregular or fast heart rhythms that are not immediately life-threatening. Tell a health care provider about:  Any allergies you have.  All medicines you are taking, including vitamins, herbs, eye drops, creams, and over-the-counter medicines.  Any problems you or family members have had with anesthetic medicines.  Any blood disorders you have.  Any surgeries you have had.  Any medical conditions you have.  Whether you are pregnant or may be pregnant. What are the risks? Generally, this is a safe procedure. However, problems may occur, including:  Allergic reactions to medicines.  A blood clot that breaks free and travels to other parts of your body.  The possible return of an abnormal heart rhythm within hours or days after the procedure.  Your heart stopping (cardiac arrest). This is rare. What happens before the procedure? Medicines  Your health care provider may have you start taking: ? Blood-thinning medicines (anticoagulants) so your blood does not clot as easily. ? Medicines to help stabilize your heart rate and rhythm.  Ask your health care provider about: ? Changing or stopping your regular medicines. This is especially important if you are taking diabetes medicines or blood thinners. ? Taking medicines such as aspirin and ibuprofen. These medicines can  thin your blood. Do not take these medicines unless your health care provider tells you to take them. ? Taking over-the-counter medicines, vitamins, herbs, and supplements. General instructions  Follow instructions from your health care provider about eating or drinking restrictions.  Plan to have someone take you home from the hospital or clinic.  If you will be going home right after the procedure, plan to have someone with you for 24 hours.  Ask your health care provider what steps will be taken to help prevent infection. These may include washing your skin with a germ-killing soap. What happens during the procedure?   An IV will be inserted into one of your veins.  Sticky patches (electrodes) or metal paddles may be placed on your chest.  You will be given a medicine to help you relax (sedative).  An electrical shock will be delivered. The procedure may vary among health care providers and hospitals. What can I expect after the procedure?  Your blood pressure, heart rate, breathing rate, and blood oxygen level will be monitored until you leave the hospital or clinic.  Your heart rhythm will be watched to make sure it does not change.  You may have some redness on the skin where the shocks were given. Follow these instructions at home:  Do not drive for 24 hours if you were given a sedative during your procedure.  Take over-the-counter and prescription medicines only as told by your health care provider.  Ask your health care provider how to check your pulse. Check it often.  Rest for 48 hours after the procedure or   as told by your health care provider.  Avoid or limit your caffeine use as told by your health care provider.  Keep all follow-up visits as told by your health care provider. This is important. Contact a health care provider if:  You feel like your heart is beating too quickly or your pulse is not regular.  You have a serious muscle cramp that does not go  away. Get help right away if:  You have discomfort in your chest.  You are dizzy or you feel faint.  You have trouble breathing or you are short of breath.  Your speech is slurred.  You have trouble moving an arm or leg on one side of your body.  Your fingers or toes turn cold or blue. Summary  Electrical cardioversion is the delivery of a jolt of electricity to restore a normal rhythm to the heart.  This procedure may be done right away in an emergency or may be a scheduled procedure if the condition is not an emergency.  Generally, this is a safe procedure.  After the procedure, check your pulse often as told by your health care provider. This information is not intended to replace advice given to you by your health care provider. Make sure you discuss any questions you have with your health care provider. Document Revised: 08/13/2018 Document Reviewed: 08/13/2018 Elsevier Patient Education  [place] not take torsemide today.  Tomorrow, start lower dose of 80 mg in the morning and 60 mg in the afternoon.  Stop potassium for now.  Will need labs next Monday.

## 2019-08-14 NOTE — Anesthesia Postprocedure Evaluation (Signed)
Anesthesia Post Note  Patient: Douglas [name].  Procedure(s) Performed: CARDIOVERSION (N/A )     Patient location during evaluation: Endoscopy Anesthesia Type: General Level of consciousness: awake and alert Pain management: pain level controlled Vital Signs Assessment: post-procedure vital signs reviewed and stable Respiratory status: spontaneous breathing, nonlabored ventilation, respiratory function stable and patient connected to nasal cannula oxygen Cardiovascular status: blood pressure returned to baseline and stable Postop Assessment: no apparent nausea or vomiting Anesthetic complications: no   No complications documented.  Last Vitals:  Vitals:   08/14/19 1110 08/14/19 1120  BP: 94/62 (!) 104/59  Pulse: 73 73  Resp: 19 19  Temp:    SpO2: 97% 94%    Last Pain:  Vitals:   08/14/19 1120  TempSrc:   PainSc: 0-No pain                 Barnet Glasgow

## 2019-08-14 NOTE — Procedures (Signed)
Electrical Cardioversion Procedure Note Hameed Kolar [id number] 03-04-1985  Procedure: Electrical Cardioversion Indications:  Atrial Flutter  Procedure Details Consent: Risks of procedure as well as the alternatives and risks of each were explained to the (patient/caregiver).  Consent for procedure obtained. Time Out: Verified patient identification, verified procedure, site/side was marked, verified correct patient position, special equipment/implants available, medications/allergies/relevent history reviewed, required imaging and test results available.  Performed  Patient placed on cardiac monitor, pulse oximetry, supplemental oxygen as necessary.  Sedation given: Per anesthesiology Pacer pads placed anterior and posterior chest.  Cardioverted 1 time(s).  Cardioverted at 150J.  Evaluation Findings: Post procedure EKG shows: NSR Complications: None Patient did tolerate procedure well.   [name] 08/14/2019, 10:36 AM

## 2019-08-14 NOTE — Telephone Encounter (Signed)
-----   Message from Larey Dresser, MD sent at 08/14/2019 10:40 AM EDT ----- He needs to stop potassium supplement (K 5.5 on istat today), decrease torsemide from 100 qam/80 qpm to 80 qam/60 qpm and get BMET next week on Monday.  He had DCCV today.  Please call with instructions.  No more torsemide today, start tomorrow on lower dose.

## 2019-08-14 NOTE — Anesthesia Procedure Notes (Signed)
Procedure Name: MAC Date/Time: 08/14/2019 10:30 AM Performed by: Inda Coke, CRNA Pre-anesthesia Checklist: Patient identified, Emergency Drugs available, Suction available, Timeout performed and Patient being monitored Patient Re-evaluated:Patient Re-evaluated prior to induction Oxygen Delivery Method: Ambu bag Induction Type: IV induction Dental Injury: Teeth and Oropharynx as per pre-operative assessment

## 2019-08-14 NOTE — Transfer of Care (Signed)
Immediate Anesthesia Transfer of Care Note  Patient: Douglas [name].  Procedure(s) Performed: CARDIOVERSION (N/A )  Patient Location: Endoscopy Unit  Anesthesia Type:General  Level of Consciousness: awake and alert   Airway & Oxygen Therapy: Patient Spontanous Breathing and Patient connected to nasal cannula oxygen  Post-op Assessment: Report given to RN and Post -op Vital signs reviewed and stable  Post vital signs: Reviewed and stable  Last Vitals:  Vitals Value Taken Time  BP 100/58 08/14/19 1052  Temp 36.9 C 08/14/19 1046  Pulse 75 08/14/19 1048  Resp 29 08/14/19 1048  SpO2 95 % 08/14/19 1048    Last Pain:  Vitals:   08/14/19 1046  TempSrc: Axillary  PainSc:          Complications: No complications documented.

## 2019-08-15 ENCOUNTER — Other Ambulatory Visit (HOSPITAL_COMMUNITY): Payer: Self-pay

## 2019-08-15 NOTE — Progress Notes (Signed)
Paramedicine Encounter    Patient ID: Douglas Madura., male    DOB: [birth year], 34 y.o.   MRN: [id number]   Patient Care Team: Patient, No Pcp Per as PCP - General ([place]) Larey Dresser, MD as PCP - Advanced Heart Failure (Cardiology)  Patient Active Problem List   Diagnosis Date Noted  . PFO (patent foramen ovale) 05/16/2019  . CVA (cerebral vascular accident) ([place]) 10/14/2018  . Morbid obesity ([place]) 06/17/2015  . Essential hypertension 04/14/2015  . Hyperkalemia 04/14/2015  . OSA (obstructive sleep apnea)   . Difficult intravenous access   . Chronic diastolic CHF (congestive heart failure), NYHA class 3 ([place]) 09/13/2014  . Cor pulmonale, chronic ([place]) 03/19/2014  . OSA treated with BiPAP     Current Outpatient Medications:  .  albuterol (PROVENTIL HFA) 108 (90 Base) MCG/ACT inhaler, Inhale 1-2 puffs into the lungs every 6 (six) hours as needed for wheezing or shortness of breath., Disp: 18 g, Rfl: 3 .  apixaban (ELIQUIS) 5 MG TABS tablet, Take 1 tablet (5 mg total) by mouth 2 (two) times daily., Disp: 60 tablet, Rfl: 6 .  atorvastatin (LIPITOR) 80 MG tablet, Take 1 tablet (80 mg total) by mouth daily at 6 PM., Disp: 30 tablet, Rfl: 11 .  brimonidine (ALPHAGAN) 0.2 % ophthalmic solution, Place 1 drop into both eyes in the morning and at bedtime., Disp: , Rfl:  .  carvedilol (COREG) 25 MG tablet, Take 1 tablet (25 mg total) by mouth 2 (two) times daily with a meal., Disp: 60 tablet, Rfl: 3 .  hydrALAZINE (APRESOLINE) 25 MG tablet, TAKE 1 TABLET (25 MG TOTAL) BY MOUTH 3 (THREE) TIMES DAILY., Disp: 270 tablet, Rfl: 3 .  OXYGEN, Inhale 2 L/min into the lungs as needed (shortness of breath). , Disp: , Rfl:  .  sildenafil (VIAGRA) 50 MG tablet, Take 1 tablet (50 mg total) by mouth daily as needed for erectile dysfunction., Disp: 10 tablet, Rfl: 1 .  torsemide (DEMADEX) 20 MG tablet, Take 4 tablets (80 mg total) by mouth in the morning AND 3 tablets (60 mg total) every  evening., Disp: 270 tablet, Rfl: 2 Allergies  Allergen Reactions  . Lisinopril Cough      Social History   Socioeconomic History  . Marital status: Single    Spouse name: Not on file  . Number of children: 2  . Years of education: Not on file  . Highest education level: Not on file  Occupational History  . Occupation: N/A    Employer: NOT EMPLOYED  Tobacco Use  . Smoking status: Former Smoker    Packs/day: 1.50    Years: 3.00    Pack years: 4.50    Types: Cigarettes    Quit date: 10/31/2013    Years since quitting: 5.7  . Smokeless tobacco: Never Used  Substance and Sexual Activity  . Alcohol use: No    Alcohol/week: 1.0 standard drink    Types: 1 Cans of beer per week  . Drug use: No    Types: Cocaine    Comment: 10/31/2013 "might use cocaine once/month" - Has not used since 2015.   Marland Kitchen Sexual activity: Never  Other Topics Concern  . Not on file  Social History Narrative  . Not on file   Social Determinants of Health   Financial Resource Strain:   . Difficulty of Paying Living Expenses:   Food Insecurity:   . Worried About Charity fundraiser in the Last Year:   .  Ran Out of Food in the Last Year:   Transportation Needs:   . Film/video editor (Medical):   Marland Kitchen Lack of Transportation (Non-Medical):   Physical Activity:   . Days of Exercise per Week:   . Minutes of Exercise per Session:   Stress:   . Feeling of Stress :   Social Connections:   . Frequency of Communication with Friends and Family:   . Frequency of Social Gatherings with Friends and Family:   . Attends Religious Services:   . Active Member of Clubs or Organizations:   . Attends Archivist Meetings:   Marland Kitchen Marital Status:   Intimate Partner Violence:   . Fear of Current or Ex-Partner:   . Emotionally Abused:   Marland Kitchen Physically Abused:   . Sexually Abused:     Physical Exam Cardiovascular:     Rate and Rhythm: Normal rate and regular rhythm.     Pulses: Normal pulses.  Pulmonary:      Effort: Pulmonary effort is normal.     Breath sounds: Normal breath sounds.  Musculoskeletal:        General: Normal range of motion.     Right lower leg: No edema.     Left lower leg: No edema.  Skin:    General: Skin is warm and dry.     Capillary Refill: Capillary refill takes less than 2 seconds.  Neurological:     Mental Status: He is oriented to person, place, and time.  Psychiatric:        Mood and Affect: Mood normal.         Future Appointments  Date Time Provider [place]  08/21/2019 10:30 AM Sherran Needs, NP [facility] None  09/17/2019 12:00 PM [facility] PA/NP [facility] None    BP 110/67 (BP Location: Left Arm, Patient Position: Sitting, Cuff Size: Normal)   Pulse 68   Resp 16   Wt (!) 263 lb (119.3 kg)   SpO2 95%   BMI 45.14 kg/m   Weight yesterday- 262 lb Last visit weight- 262 lb  Douglas [name] was seen at home today and reported feeling well. He denied chest pain, SOB. Headache, dizziness, orthopnea, fever or cough over the past week. He stated he had some minor abdominal pain but he was not overly concerned with it. He stated he has been compliant with his medications over the past week and his weight has been stable. His medications were verified and his pillbox was refilled. I will follow up next week.   [name], EMT 08/15/19  ACTION: Home visit completed Next visit planned for 1 week

## 2019-08-21 ENCOUNTER — Encounter (HOSPITAL_COMMUNITY): Payer: Self-pay

## 2019-08-21 ENCOUNTER — Ambulatory Visit (HOSPITAL_COMMUNITY): Payer: Medicaid Other | Admitting: Nurse Practitioner

## 2019-08-23 ENCOUNTER — Telehealth (HOSPITAL_COMMUNITY): Payer: Self-pay | Admitting: *Deleted

## 2019-08-23 NOTE — Telephone Encounter (Signed)
Contacted patient to follow up on [employer] process. Patient stated since his admission he has been set back slightly. He is going to to early next week to submit application for joining [employer]. I have instructed him to request "[employer]" which is a 3 week orientation/personal training to help his new transition to exercise and fitness. He is to ask for Remo Lipps, specifically at the [facility] for this request. He understands to let me know when he has become a member so we can pursue exercise prescription. [facility] will follow as as needed.     Landis Martins, MS, ACSM, [facility] Clinical Exercise Physiologist/ Health and Wellness Coach

## 2019-08-24 ENCOUNTER — Other Ambulatory Visit (HOSPITAL_COMMUNITY): Payer: Self-pay | Admitting: Cardiology

## 2019-08-25 DIAGNOSIS — R578 Other shock: Secondary | ICD-10-CM

## 2019-08-25 DIAGNOSIS — K922 Gastrointestinal hemorrhage, unspecified: Secondary | ICD-10-CM

## 2019-08-25 HISTORY — DX: Other shock: R57.8

## 2019-08-25 HISTORY — DX: Gastrointestinal hemorrhage, unspecified: K92.2

## 2019-08-29 ENCOUNTER — Telehealth (HOSPITAL_COMMUNITY): Payer: Self-pay

## 2019-08-29 NOTE — Telephone Encounter (Signed)
I called Douglas [name] to see if he was available for an appointment. He did not answer so I left a message requesting he call me back.   [name], EMT 08/29/19

## 2019-09-03 ENCOUNTER — Telehealth (HOSPITAL_COMMUNITY): Payer: Self-pay

## 2019-09-03 NOTE — Telephone Encounter (Signed)
I called Douglas [name] to schedule an appointment. He stated he would be available to meet on Thursday at 11:00.  [name], EMT 09/03/19

## 2019-09-06 ENCOUNTER — Other Ambulatory Visit (HOSPITAL_COMMUNITY): Payer: Self-pay

## 2019-09-06 NOTE — Progress Notes (Signed)
Paramedicine Encounter    Patient ID: [name], male    DOB: [birth year], 34 y.o.   MRN: [id number]   Patient Care Team: Patient, No Pcp Per as PCP - General ([place]) Larey Dresser, MD as PCP - Advanced Heart Failure (Cardiology)  Patient Active Problem List   Diagnosis Date Noted  . PFO (patent foramen ovale) 05/16/2019  . CVA (cerebral vascular accident) ([place]) 10/14/2018  . Morbid obesity ([place]) 06/17/2015  . Essential hypertension 04/14/2015  . Hyperkalemia 04/14/2015  . OSA (obstructive sleep apnea)   . Difficult intravenous access   . Chronic diastolic CHF (congestive heart failure), NYHA class 3 ([place]) 09/13/2014  . Cor pulmonale, chronic ([place]) 03/19/2014  . OSA treated with BiPAP     Current Outpatient Medications:  .  albuterol (PROVENTIL HFA) 108 (90 Base) MCG/ACT inhaler, Inhale 1-2 puffs into the lungs every 6 (six) hours as needed for wheezing or shortness of breath., Disp: 18 g, Rfl: 3 .  atorvastatin (LIPITOR) 80 MG tablet, Take 1 tablet (80 mg total) by mouth daily at 6 PM., Disp: 30 tablet, Rfl: 11 .  carvedilol (COREG) 25 MG tablet, Take 1 tablet (25 mg total) by mouth 2 (two) times daily with a meal., Disp: 60 tablet, Rfl: 3 .  ELIQUIS 5 MG TABS tablet, TAKE 1 TABLET (5 MG TOTAL) BY MOUTH 2 (TWO) TIMES DAILY., Disp: 60 tablet, Rfl: 6 .  hydrALAZINE (APRESOLINE) 25 MG tablet, TAKE 1 TABLET (25 MG TOTAL) BY MOUTH 3 (THREE) TIMES DAILY., Disp: 270 tablet, Rfl: 3 .  OXYGEN, Inhale 2 L/min into the lungs as needed (shortness of breath). , Disp: , Rfl:  .  torsemide (DEMADEX) 20 MG tablet, Take 4 tablets (80 mg total) by mouth in the morning AND 3 tablets (60 mg total) every evening., Disp: 270 tablet, Rfl: 2 .  brimonidine (ALPHAGAN) 0.2 % ophthalmic solution, Place 1 drop into both eyes in the morning and at bedtime. (Patient not taking: Reported on 09/06/2019), Disp: , Rfl:  .  sildenafil (VIAGRA) 50 MG tablet, Take 1 tablet (50 mg total) by mouth  daily as needed for erectile dysfunction. (Patient not taking: Reported on 08/15/2019), Disp: 10 tablet, Rfl: 1 Allergies  Allergen Reactions  . Lisinopril Cough      Social History   Socioeconomic History  . Marital status: Single    Spouse name: Not on file  . Number of children: 2  . Years of education: Not on file  . Highest education level: Not on file  Occupational History  . Occupation: N/A    Employer: NOT EMPLOYED  Tobacco Use  . Smoking status: Former Smoker    Packs/day: 1.50    Years: 3.00    Pack years: 4.50    Types: Cigarettes    Quit date: 10/31/2013    Years since quitting: 5.8  . Smokeless tobacco: Never Used  Substance and Sexual Activity  . Alcohol use: No    Alcohol/week: 1.0 standard drink    Types: 1 Cans of beer per week  . Drug use: No    Types: Cocaine    Comment: 10/31/2013 "might use cocaine once/month" - Has not used since 2015.   Marland Kitchen Sexual activity: Never  Other Topics Concern  . Not on file  Social History Narrative  . Not on file   Social Determinants of Health   Financial Resource Strain:   . Difficulty of Paying Living Expenses:   Food Insecurity:   . Worried  About Running Out of Food in the Last Year:   . [place] in the Last Year:   Transportation Needs:   . Lack of Transportation (Medical):   Marland Kitchen Lack of Transportation (Non-Medical):   Physical Activity:   . Days of Exercise per Week:   . Minutes of Exercise per Session:   Stress:   . Feeling of Stress :   Social Connections:   . Frequency of Communication with Friends and Family:   . Frequency of Social Gatherings with Friends and Family:   . Attends Religious Services:   . Active Member of Clubs or Organizations:   . Attends Archivist Meetings:   Marland Kitchen Marital Status:   Intimate Partner Violence:   . Fear of Current or Ex-Partner:   . Emotionally Abused:   Marland Kitchen Physically Abused:   . Sexually Abused:     Physical Exam Cardiovascular:     Rate and  Rhythm: Normal rate and regular rhythm.     Pulses: Normal pulses.  Pulmonary:     Effort: Pulmonary effort is normal.     Breath sounds: Normal breath sounds.  Abdominal:     General: There is distension.  Musculoskeletal:        General: Normal range of motion.     Right lower leg: Edema present.     Left lower leg: Edema present.  Skin:    General: Skin is warm and dry.     Capillary Refill: Capillary refill takes less than 2 seconds.  Neurological:     Mental Status: He is alert and oriented to person, place, and time.  Psychiatric:        Mood and Affect: Mood normal.         Future Appointments  Date Time Provider [place]  09/17/2019 12:00 PM [facility] PA/NP [facility] None    BP 104/70 (BP Location: Left Arm, Patient Position: Sitting, Cuff Size: Large)   Resp 18   Wt 279 lb 1.6 oz (126.6 kg)   SpO2 92%   BMI 47.91 kg/m   Weight yesterday- did not weigh  Last visit weight- 263 lb  Mr Siever was seen at home today and reported feeling unwell. He reported minor SOB, significant weight gain and stated his legs have been swelling. He denied chest pain, dizziness, orthopnea, fever or cough since our last appointment and stated he has been compliant with his medications however after a pill count of all his medications it became clear he has not been taking torsemide for quite some time. Specifically out of a bottle of 210 tablets of torsemide which was delivered on July 22, 194 tablets remained. With his current regimen of 4 tablets in the morning and 3 in the evening he should have only had around 60 tablets left. Additionally hydralazine had 86 tablets in a bottle that came with 90 on July 22. We spent time talking about what would happen if he continues to neglect himself vis-a-vis medication noncompliance and he agreed to go back to using a pillbox and keeping weekly home visits with me. I provided a new box and filled it for him. I will follow up next week.    [name], EMT 09/06/19  ACTION: Home visit completed Next visit planned for 1 week

## 2019-09-07 ENCOUNTER — Encounter (HOSPITAL_COMMUNITY): Payer: Self-pay | Admitting: Emergency Medicine

## 2019-09-07 ENCOUNTER — Other Ambulatory Visit: Payer: Self-pay

## 2019-09-07 ENCOUNTER — Emergency Department (HOSPITAL_COMMUNITY): Payer: Medicaid Other

## 2019-09-07 ENCOUNTER — Inpatient Hospital Stay (HOSPITAL_COMMUNITY)
Admission: EM | Admit: 2019-09-07 | Discharge: 2019-09-13 | DRG: 377 | Disposition: A | Discharge status: Home / Self Care | Payer: Medicaid Other | Attending: Internal Medicine | Admitting: Internal Medicine

## 2019-09-07 DIAGNOSIS — K921 Melena: Principal | ICD-10-CM | POA: Diagnosis present

## 2019-09-07 DIAGNOSIS — Z6841 Body Mass Index (BMI) 40.0 and over, adult: Secondary | ICD-10-CM

## 2019-09-07 DIAGNOSIS — Z809 Family history of malignant neoplasm, unspecified: Secondary | ICD-10-CM

## 2019-09-07 DIAGNOSIS — Z87891 Personal history of nicotine dependence: Secondary | ICD-10-CM

## 2019-09-07 DIAGNOSIS — Z8673 Personal history of transient ischemic attack (TIA), and cerebral infarction without residual deficits: Secondary | ICD-10-CM

## 2019-09-07 DIAGNOSIS — I5032 Chronic diastolic (congestive) heart failure: Secondary | ICD-10-CM | POA: Diagnosis present

## 2019-09-07 DIAGNOSIS — J9611 Chronic respiratory failure with hypoxia: Secondary | ICD-10-CM | POA: Diagnosis present

## 2019-09-07 DIAGNOSIS — Z8249 Family history of ischemic heart disease and other diseases of the circulatory system: Secondary | ICD-10-CM

## 2019-09-07 DIAGNOSIS — E861 Hypovolemia: Secondary | ICD-10-CM | POA: Diagnosis present

## 2019-09-07 DIAGNOSIS — K922 Gastrointestinal hemorrhage, unspecified: Secondary | ICD-10-CM

## 2019-09-07 DIAGNOSIS — Z823 Family history of stroke: Secondary | ICD-10-CM

## 2019-09-07 DIAGNOSIS — D62 Acute posthemorrhagic anemia: Secondary | ICD-10-CM | POA: Diagnosis present

## 2019-09-07 DIAGNOSIS — K317 Polyp of stomach and duodenum: Secondary | ICD-10-CM | POA: Diagnosis present

## 2019-09-07 DIAGNOSIS — R571 Hypovolemic shock: Secondary | ICD-10-CM | POA: Diagnosis present

## 2019-09-07 DIAGNOSIS — R0602 Shortness of breath: Secondary | ICD-10-CM

## 2019-09-07 DIAGNOSIS — I2781 Cor pulmonale (chronic): Secondary | ICD-10-CM | POA: Diagnosis present

## 2019-09-07 DIAGNOSIS — E875 Hyperkalemia: Secondary | ICD-10-CM | POA: Diagnosis present

## 2019-09-07 DIAGNOSIS — I484 Atypical atrial flutter: Secondary | ICD-10-CM | POA: Diagnosis present

## 2019-09-07 DIAGNOSIS — I130 Hypertensive heart and chronic kidney disease with heart failure and stage 1 through stage 4 chronic kidney disease, or unspecified chronic kidney disease: Secondary | ICD-10-CM | POA: Diagnosis present

## 2019-09-07 DIAGNOSIS — D7589 Other specified diseases of blood and blood-forming organs: Secondary | ICD-10-CM | POA: Diagnosis present

## 2019-09-07 DIAGNOSIS — N179 Acute kidney failure, unspecified: Secondary | ICD-10-CM | POA: Diagnosis present

## 2019-09-07 DIAGNOSIS — N1831 Chronic kidney disease, stage 3a: Secondary | ICD-10-CM | POA: Diagnosis present

## 2019-09-07 DIAGNOSIS — K222 Esophageal obstruction: Secondary | ICD-10-CM | POA: Diagnosis present

## 2019-09-07 DIAGNOSIS — K920 Hematemesis: Secondary | ICD-10-CM | POA: Diagnosis present

## 2019-09-07 DIAGNOSIS — E669 Obesity, unspecified: Secondary | ICD-10-CM | POA: Diagnosis present

## 2019-09-07 DIAGNOSIS — J9621 Acute and chronic respiratory failure with hypoxia: Secondary | ICD-10-CM | POA: Diagnosis present

## 2019-09-07 DIAGNOSIS — I959 Hypotension, unspecified: Secondary | ICD-10-CM | POA: Diagnosis present

## 2019-09-07 DIAGNOSIS — E785 Hyperlipidemia, unspecified: Secondary | ICD-10-CM | POA: Diagnosis present

## 2019-09-07 DIAGNOSIS — I5082 Biventricular heart failure: Secondary | ICD-10-CM | POA: Diagnosis present

## 2019-09-07 DIAGNOSIS — Z20822 Contact with and (suspected) exposure to covid-19: Secondary | ICD-10-CM | POA: Diagnosis present

## 2019-09-07 DIAGNOSIS — K449 Diaphragmatic hernia without obstruction or gangrene: Secondary | ICD-10-CM | POA: Diagnosis present

## 2019-09-07 DIAGNOSIS — G4733 Obstructive sleep apnea (adult) (pediatric): Secondary | ICD-10-CM | POA: Diagnosis present

## 2019-09-07 DIAGNOSIS — I480 Paroxysmal atrial fibrillation: Secondary | ICD-10-CM | POA: Diagnosis present

## 2019-09-07 DIAGNOSIS — Z7901 Long term (current) use of anticoagulants: Secondary | ICD-10-CM

## 2019-09-07 DIAGNOSIS — E662 Morbid (severe) obesity with alveolar hypoventilation: Secondary | ICD-10-CM | POA: Diagnosis present

## 2019-09-07 DIAGNOSIS — E873 Alkalosis: Secondary | ICD-10-CM | POA: Diagnosis present

## 2019-09-07 DIAGNOSIS — I471 Supraventricular tachycardia: Secondary | ICD-10-CM | POA: Diagnosis present

## 2019-09-07 DIAGNOSIS — D371 Neoplasm of uncertain behavior of stomach: Secondary | ICD-10-CM | POA: Diagnosis present

## 2019-09-07 DIAGNOSIS — I10 Essential (primary) hypertension: Secondary | ICD-10-CM | POA: Diagnosis present

## 2019-09-07 DIAGNOSIS — I2729 Other secondary pulmonary hypertension: Secondary | ICD-10-CM | POA: Diagnosis present

## 2019-09-07 DIAGNOSIS — Z8774 Personal history of (corrected) congenital malformations of heart and circulatory system: Secondary | ICD-10-CM

## 2019-09-07 DIAGNOSIS — Z79899 Other long term (current) drug therapy: Secondary | ICD-10-CM

## 2019-09-07 DIAGNOSIS — E871 Hypo-osmolality and hyponatremia: Secondary | ICD-10-CM | POA: Diagnosis present

## 2019-09-07 DIAGNOSIS — D751 Secondary polycythemia: Secondary | ICD-10-CM | POA: Diagnosis present

## 2019-09-07 LAB — COMPREHENSIVE METABOLIC PANEL
ALT: 19 U/L (ref 0–44)
AST: 23 U/L (ref 15–41)
Albumin: 2.6 g/dL — ABNORMAL LOW (ref 3.5–5.0)
Alkaline Phosphatase: 77 U/L (ref 38–126)
Anion gap: 9 (ref 5–15)
BUN: 86 mg/dL — ABNORMAL HIGH (ref 6–20)
CO2: 46 mmol/L — ABNORMAL HIGH (ref 22–32)
Calcium: 8.6 mg/dL — ABNORMAL LOW (ref 8.9–10.3)
Chloride: 84 mmol/L — ABNORMAL LOW (ref 98–111)
Creatinine, Ser: 1.92 mg/dL — ABNORMAL HIGH (ref 0.61–1.24)
GFR calc Af Amer: 51 mL/min — ABNORMAL LOW (ref >60)
GFR calc non Af Amer: 44 mL/min — ABNORMAL LOW (ref >60)
Glucose, Bld: 103 mg/dL — ABNORMAL HIGH (ref 70–99)
Potassium: 6.1 mmol/L — ABNORMAL HIGH (ref 3.5–5.1)
Sodium: 139 mmol/L (ref 135–145)
Total Bilirubin: 2 mg/dL — ABNORMAL HIGH (ref 0.3–1.2)
Total Protein: 5.3 g/dL — ABNORMAL LOW (ref 6.5–8.1)

## 2019-09-07 LAB — CBC
HCT: 46.9 % (ref 39.0–52.0)
Hemoglobin: 12.5 g/dL — ABNORMAL LOW (ref 13.0–17.0)
MCH: 27.4 pg (ref 26.0–34.0)
MCHC: 26.7 g/dL — ABNORMAL LOW (ref 30.0–36.0)
MCV: 102.6 fL — ABNORMAL HIGH (ref 80.0–100.0)
Platelets: 171 10*3/uL (ref 150–400)
RBC: 4.57 MIL/uL (ref 4.22–5.81)
RDW: 16.3 % — ABNORMAL HIGH (ref 11.5–15.5)
WBC: 11.8 10*3/uL — ABNORMAL HIGH (ref 4.0–10.5)
nRBC: 0 % (ref 0.0–0.2)

## 2019-09-07 LAB — URINALYSIS, ROUTINE W REFLEX MICROSCOPIC
Bilirubin Urine: NEGATIVE
Glucose, UA: NEGATIVE mg/dL
Hgb urine dipstick: NEGATIVE
Ketones, ur: NEGATIVE mg/dL
Leukocytes,Ua: NEGATIVE
Nitrite: NEGATIVE
Protein, ur: NEGATIVE mg/dL
Specific Gravity, Urine: 1.009 (ref 1.005–1.030)
pH: 7 (ref 5.0–8.0)

## 2019-09-07 LAB — LIPASE, BLOOD: Lipase: 21 U/L (ref 11–51)

## 2019-09-07 LAB — POC OCCULT BLOOD, ED: Fecal Occult Bld: POSITIVE — AB

## 2019-09-07 MED ORDER — SODIUM CHLORIDE 0.9 % IV SOLN
8.0000 mg/h | INTRAVENOUS | Status: DC
  Administered 2019-09-08 (×2): 8 mg/h via INTRAVENOUS
  Filled 2019-09-07 (×2): qty 80

## 2019-09-07 MED ORDER — SODIUM CHLORIDE 0.9 % IV SOLN
80.0000 mg | Freq: Once | INTRAVENOUS | Status: AC
  Administered 2019-09-08: 02:00:00 80 mg via INTRAVENOUS
  Filled 2019-09-07: qty 80

## 2019-09-07 MED ORDER — SODIUM CHLORIDE 0.9 % IV BOLUS
500.0000 mL | Freq: Once | INTRAVENOUS | Status: AC
  Administered 2019-09-08: 500 mL via INTRAVENOUS

## 2019-09-07 NOTE — ED Triage Notes (Signed)
Pt to triage via [facility].  Reports coughing up blood, nausea, dizziness, and abdominal distention.  Incontinent of urine on arrival to ED.  Pt takes Eliquis.  Wears 2-3 liters O2 at home.

## 2019-09-07 NOTE — ED Provider Notes (Signed)
[facility] EMERGENCY DEPARTMENT Provider Note   CSN: [id number] Arrival date & time: 09/07/19  1554     History Chief Complaint  Patient presents with  . Melena    Douglas [name]. is a 34 y.o. male with a history of CHF with last EF 15-17%, RV systolic function severely reduced, chronic respiratory failure on 2-3L @ baseline, hypertension, hypercholesterolemia, OSA on CPAP, cor pulmonale, PFO,and prior CVA who presents to the ED with complaints of melena that began today. Patient states that throughout the day today he has had 5-7 loose almost black appearing bowel movements as well as some nausea with an episodes of what he is describing as black coffee ground emesis. He has felt intermittently lightheaded with position changes, however no syncope has occurred. Reports some abdominal distension earlier but this has resolved. He denies abdominal pain, syncope, chest pain, dyspnea, cough (despite triage note), fever, chills,or dysuria. He takes eliquis, denies NSAID use.   HPI     Past Medical History:  Diagnosis Date  . Arthritis    "hands, feet" (10/31/2013)  . CHF (congestive heart failure) ([place])   . Chronic bronchitis ([place])   . High cholesterol   . Hypertension   . Obesity   . OSA on CPAP   . Pneumonia 09/2011  . Substance abuse ([place]) 8/31/213   cociane "first time use"  . Tobacco abuse     Patient Active Problem List   Diagnosis Date Noted  . PFO (patent foramen ovale) 05/16/2019  . CVA (cerebral vascular accident) ([place]) 10/14/2018  . Morbid obesity ([place]) 06/17/2015  . Essential hypertension 04/14/2015  . Hyperkalemia 04/14/2015  . OSA (obstructive sleep apnea)   . Difficult intravenous access   . Chronic diastolic CHF (congestive heart failure), NYHA class 3 ([place]) 09/13/2014  . Cor pulmonale, chronic ([place]) 03/19/2014  . OSA treated with BiPAP     Past Surgical History:  Procedure Laterality Date  . BUBBLE STUDY  10/15/2018   Procedure:  BUBBLE STUDY;  Surgeon: Larey Dresser, MD;  Location: [facility];  Service: Cardiovascular;;  . CARDIOVERSION N/A 07/15/2019   Procedure: CARDIOVERSION;  Surgeon: Larey Dresser, MD;  Location: [facility] ENDOSCOPY;  Service: Cardiovascular;  Laterality: N/A;  . CARDIOVERSION N/A 08/14/2019   Procedure: CARDIOVERSION;  Surgeon: Larey Dresser, MD;  Location: [facility] ENDOSCOPY;  Service: Cardiovascular;  Laterality: N/A;  . NO PAST SURGERIES    . PATENT FORAMEN OVALE(PFO) CLOSURE N/A 05/16/2019   Procedure: PATENT FORAMEN OVALE (PFO) CLOSURE;  Surgeon: Sherren Mocha, MD;  Location: [facility] CV LAB;  Service: Cardiovascular;  Laterality: N/A;  . TEE WITHOUT CARDIOVERSION N/A 10/15/2018   Procedure: TRANSESOPHAGEAL ECHOCARDIOGRAM (TEE);  Surgeon: Larey Dresser, MD;  Location: [facility] ENDOSCOPY;  Service: Cardiovascular;  Laterality: N/A;  . TEE WITHOUT CARDIOVERSION N/A 07/15/2019   Procedure: TRANSESOPHAGEAL ECHOCARDIOGRAM (TEE);  Surgeon: Larey Dresser, MD;  Location: [facility] ENDOSCOPY;  Service: Cardiovascular;  Laterality: N/A;  . TUBES IN EARS         Family History  Problem Relation Age of Onset  . Heart disease Mother        Pacemaker  . Hypertension Father   . Cancer Paternal Grandfather   . Heart attack Neg Hx   . Stroke Neg Hx     Social History   Tobacco Use  . Smoking status: Former Smoker    Packs/day: 1.50    Years: 3.00    Pack years: 4.50    Types:  Cigarettes    Quit date: 10/31/2013    Years since quitting: 5.8  . Smokeless tobacco: Never Used  Substance Use Topics  . Alcohol use: No    Alcohol/week: 1.0 standard drink    Types: 1 Cans of beer per week  . Drug use: No    Types: Cocaine    Comment: 10/31/2013 "might use cocaine once/month" - Has not used since 2015.     Home Medications Prior to Admission medications   Medication Sig Start Date End Date Taking? Authorizing Provider  albuterol (PROVENTIL HFA) 108 (90 Base) MCG/ACT inhaler Inhale 1-2 puffs into the  lungs every 6 (six) hours as needed for wheezing or shortness of breath. 05/23/19   Larey Dresser, MD  atorvastatin (LIPITOR) 80 MG tablet Take 1 tablet (80 mg total) by mouth daily at 6 PM. 10/17/18   Earlene Plater, MD  brimonidine (ALPHAGAN) 0.2 % ophthalmic solution Place 1 drop into both eyes in the morning and at bedtime. Patient not taking: Reported on 09/06/2019    [provider]  carvedilol (COREG) 25 MG tablet Take 1 tablet (25 mg total) by mouth 2 (two) times daily with a meal. 08/08/19   Clegg, [name] D, NP  ELIQUIS 5 MG TABS tablet TAKE 1 TABLET (5 MG TOTAL) BY MOUTH 2 (TWO) TIMES DAILY. 08/26/19   Larey Dresser, MD  hydrALAZINE (APRESOLINE) 25 MG tablet TAKE 1 TABLET (25 MG TOTAL) BY MOUTH 3 (THREE) TIMES DAILY. 07/19/19   Larey Dresser, MD  OXYGEN Inhale 2 L/min into the lungs as needed (shortness of breath).     [provider]  sildenafil (VIAGRA) 50 MG tablet Take 1 tablet (50 mg total) by mouth daily as needed for erectile dysfunction. Patient not taking: Reported on 08/15/2019 04/12/19   Larey Dresser, MD  torsemide [facility]) 20 MG tablet Take 4 tablets (80 mg total) by mouth in the morning AND 3 tablets (60 mg total) every evening. 08/14/19   Larey Dresser, MD    Allergies    Lisinopril  Review of Systems   Review of Systems  Constitutional: Negative for chills and fever.  Respiratory: Negative for cough and shortness of breath.   Cardiovascular: Negative for chest pain.  Gastrointestinal: Positive for abdominal distention (resolved @ present), blood in stool, diarrhea and vomiting. Negative for abdominal pain and constipation.  Genitourinary: Negative for dysuria.  Neurological: Positive for light-headedness. Negative for syncope.  All other systems reviewed and are negative.   Physical Exam Updated Vital Signs BP (!) 92/53 (BP Location: Left Arm)   Pulse 92   Temp 99.9 F (37.7 C) (Oral)   Resp 18   SpO2 97%   Physical Exam Vitals  and nursing note reviewed.  Constitutional:      General: He is not in acute distress.    Appearance: He is well-developed. He is not toxic-appearing.  HENT:     Head: Normocephalic and atraumatic.  Eyes:     General:        Right eye: No discharge.        Left eye: No discharge.     Conjunctiva/sclera: Conjunctivae normal.     Pupils: Pupils are equal, round, and reactive to light.  Cardiovascular:     Rate and Rhythm: Normal rate and regular rhythm.  Pulmonary:     Effort: Pulmonary effort is normal. No respiratory distress.     Breath sounds: Normal breath sounds. No wheezing, rhonchi or rales.  Comments: Respirations even and unlabored on baseline 3 L via nasal cannula. Abdominal:     General: There is no distension.     Palpations: Abdomen is soft.     Tenderness: There is no abdominal tenderness. There is no guarding or rebound.  Genitourinary:    Comments: Chaperone present.  DRE with dark maroon-colored stool.  Fecal occult Positive. Musculoskeletal:     Cervical back: Neck supple.     Comments: Trace symmetric bilateral lower leg edema present.  No overlying erythema/warmth.  No calf tenderness.  Skin:    General: Skin is warm and dry.     Findings: No rash.  Neurological:     Mental Status: He is alert.     Comments: Clear speech.   Psychiatric:        Behavior: Behavior normal.    ED Results / Procedures / Treatments   Labs (all labs ordered are listed, but only abnormal results are displayed) Labs Reviewed  COMPREHENSIVE METABOLIC PANEL - Abnormal; Notable for the following components:      Result Value   Potassium 6.1 (*)    Chloride 84 (*)    CO2 46 (*)    Glucose, Bld 103 (*)    BUN 86 (*)    Creatinine, Ser 1.92 (*)    Calcium 8.6 (*)    Total Protein 5.3 (*)    Albumin 2.6 (*)    Total Bilirubin 2.0 (*)    GFR calc non Af Amer 44 (*)    GFR calc Af Amer 51 (*)    All other components within normal limits  CBC - Abnormal; Notable for the  following components:   WBC 11.8 (*)    Hemoglobin 12.5 (*)    MCV 102.6 (*)    MCHC 26.7 (*)    RDW 16.3 (*)    All other components within normal limits  URINALYSIS, ROUTINE W REFLEX MICROSCOPIC - Abnormal; Notable for the following components:   Color, Urine STRAW (*)    All other components within normal limits  LIPASE, BLOOD    EKG EKG Interpretation  Date/Time:  Saturday September 07 2019 16:07:13 EDT Ventricular Rate:  83 PR Interval:  168 QRS Duration: 78 QT Interval:  380 QTC Calculation: 446 R Axis:   173 Text Interpretation: Normal sinus rhythm Right ventricular hypertrophy Nonspecific T wave abnormality Abnormal ECG No significant change since last tracing Confirmed by Gareth Morgan [phone]) on 09/07/2019 9:51:09 PM  EKG Interpretation  Date/Time:  Sunday September 08 2019 02:53:37 EDT Ventricular Rate:  93 PR Interval:  168 QRS Duration: 81 QT Interval:  427 QTC Calculation: 532 R Axis:   170 Text Interpretation: Sinus rhythm Low voltage, precordial leads Probable right ventricular hypertrophy Borderline T abnormalities, anterior leads Prolonged QT interval Confirmed by Quintella Reichert [phone]) on 09/08/2019 2:55:25 AM   Radiology DG Chest 2 View  Result Date: 09/07/2019 CLINICAL DATA:  Patient coughing up blood. EXAM: CHEST - 2 VIEW COMPARISON:  Chest radiograph October 09, 2018. FINDINGS: Marked cardiomegaly. Masslike structure right hilum most compatible with enlarged pulmonary artery. No large area of pulmonary consolidation. No pleural effusion or pneumothorax. Osseous structures unremarkable. IMPRESSION: 1. Cardiomegaly. 2. Enlarged pulmonary artery. Electronically Signed   By: Lovey Newcomer M.D.   On: 09/07/2019 16:56    Procedures .Critical Care Performed by: Amaryllis Dyke, PA-C Authorized by: Amaryllis Dyke, PA-C    CRITICAL CARE Performed by: Kennith Maes   Total critical care time: 30 minutes  Critical care time was  exclusive of separately billable procedures and treating other patients.  Critical care was necessary to treat or prevent imminent or life-threatening deterioration.  Critical care was time spent personally by me on the following activities: development of treatment plan with patient and/or surrogate as well as nursing, discussions with consultants, evaluation of patient's response to treatment, examination of patient, obtaining history from patient or surrogate, ordering and performing treatments and interventions, ordering and review of laboratory studies, ordering and review of radiographic studies, pulse oximetry and re-evaluation of patient's condition.    (including critical care time)  Medications Ordered in ED Medications - No data to display  ED Course  I have reviewed the triage vital signs and the nursing notes.  Pertinent labs & imaging results that were available during my care of the patient were reviewed by me and considered in my medical decision making (see chart for details).    MDM Rules/Calculators/A&P                         Patient presents to the ED with complaints of GI bleed.  On arrival patient with soft pressures, on my assessment 111/64 which is somewhat similar to his prior pressures on record, was temporarily in the 90s/50s in the waiting room. Borderline temp orally- will check rectal temp. On exam he has no abdominal tenderness, his DRE has dark maroon stool present and this is fecal occult positive. With his hematemesis concern for upper GI bleed. He is on eliquis.   Additional history obtained:  Additional history obtained from chart review & nursing note review. Previous records obtained and reviewed no prior EGD/colonoscopy.   EKG: Normal sinus rhythm Right ventricular hypertrophy Nonspecific T wave abnormality Abnormal ECG No significant change since last tracing  Lab Tests:  I reviewed and interpreted labs per triage & ordered additional labs, which  included:  CBC: Mild leukocytosis at 11.8.  Hemoglobin is 12.5 and hematocrit is 46.9, this is decreased from most recent 16 & 57.7 on record which appears to be patient's baseline.  CMP: Mildly uptrending creatinine, BUN significantly elevated.  Bicarb is elevated and appears to be chronic.  Patient is hyperkalemic at 6.1, no documentation of hemolysis per lab. Urinalysis: No significant UTI. I performed a fecal occult test which is positive.  Patient's blood work was performed at 1615 this afternoon, given this was 6 hours prior we will obtain an i-STAT Chem-8 to recheck his potassium as well as hemoglobin and hematocrit  Imaging Studies ordered:  CXR ordered per triage team, I independently visualized and interpreted imaging which showed 1. Cardiomegaly. 2. Enlarged pulmonary artery  Concern for upper GI bleed with elevated BUN and reports of hematemesis, hgb/hct decreased by prior, will start Protonix bolus and drip.  We will give 500 cc of fluid for mild blood pressure support as well as for hyperkalemia.  Pending i-STAT Chem-8 currently.  Anticipate admission.  Given patient's blood pressure is currently at his baseline do not feel he needs emergent Eliquis reversal currently.   Patient's I-stat chem8 with K > 7 reported, hgb/hct are actually increased- question the validity of these results with being I-stat, repeat EKG w/ prolonged QT & T wave abnormalities -Lokelma, albuterol, insulin, and glucose ordered by Dr. Ralene Bathe.  Formal potassium and hemoglobin/hematocrit ordered & pending.  Will discuss with hospitalist service for admission.  Discussed with hospitalist Dr. Cyd Silence- accepts admission.  Non-urgent GI consult placed.   Repeat K  normal- suspect > 7 on I-stat was inaccurate. Repeat Hgb/Hct continue to downtrend.   Findings and plan of care discussed with supervising physician Dr. Ralene Bathe who has evaluated the patient & is in agreement.   Portions of this note were generated with  Lobbyist. Dictation errors may occur despite best attempts at proofreading.  Final Clinical Impression(s) / ED Diagnoses Final diagnoses:  Gastrointestinal hemorrhage, unspecified gastrointestinal hemorrhage type    Rx / DC Orders ED Discharge Orders    None       Amaryllis Dyke, PA-C 09/08/19 0543    Gareth Morgan, MD 09/10/19 [phone]

## 2019-09-07 NOTE — ED Notes (Signed)
[phone] Emil father  please inform patient to call when discharged

## 2019-09-08 ENCOUNTER — Encounter (HOSPITAL_COMMUNITY): Payer: Self-pay | Admitting: Internal Medicine

## 2019-09-08 DIAGNOSIS — I10 Essential (primary) hypertension: Secondary | ICD-10-CM

## 2019-09-08 DIAGNOSIS — G4733 Obstructive sleep apnea (adult) (pediatric): Secondary | ICD-10-CM | POA: Diagnosis not present

## 2019-09-08 DIAGNOSIS — J9621 Acute and chronic respiratory failure with hypoxia: Secondary | ICD-10-CM | POA: Diagnosis present

## 2019-09-08 DIAGNOSIS — K921 Melena: Secondary | ICD-10-CM | POA: Diagnosis present

## 2019-09-08 DIAGNOSIS — E662 Morbid (severe) obesity with alveolar hypoventilation: Secondary | ICD-10-CM | POA: Diagnosis present

## 2019-09-08 DIAGNOSIS — E871 Hypo-osmolality and hyponatremia: Secondary | ICD-10-CM | POA: Diagnosis present

## 2019-09-08 DIAGNOSIS — I471 Supraventricular tachycardia: Secondary | ICD-10-CM | POA: Diagnosis present

## 2019-09-08 DIAGNOSIS — N179 Acute kidney failure, unspecified: Secondary | ICD-10-CM | POA: Diagnosis present

## 2019-09-08 DIAGNOSIS — I5032 Chronic diastolic (congestive) heart failure: Secondary | ICD-10-CM | POA: Diagnosis present

## 2019-09-08 DIAGNOSIS — E873 Alkalosis: Secondary | ICD-10-CM | POA: Diagnosis present

## 2019-09-08 DIAGNOSIS — R571 Hypovolemic shock: Secondary | ICD-10-CM | POA: Diagnosis present

## 2019-09-08 DIAGNOSIS — E669 Obesity, unspecified: Secondary | ICD-10-CM | POA: Diagnosis present

## 2019-09-08 DIAGNOSIS — I4892 Unspecified atrial flutter: Secondary | ICD-10-CM | POA: Diagnosis not present

## 2019-09-08 DIAGNOSIS — K222 Esophageal obstruction: Secondary | ICD-10-CM | POA: Diagnosis present

## 2019-09-08 DIAGNOSIS — E875 Hyperkalemia: Secondary | ICD-10-CM

## 2019-09-08 DIAGNOSIS — I2781 Cor pulmonale (chronic): Secondary | ICD-10-CM | POA: Diagnosis present

## 2019-09-08 DIAGNOSIS — I130 Hypertensive heart and chronic kidney disease with heart failure and stage 1 through stage 4 chronic kidney disease, or unspecified chronic kidney disease: Secondary | ICD-10-CM | POA: Diagnosis present

## 2019-09-08 DIAGNOSIS — J9611 Chronic respiratory failure with hypoxia: Secondary | ICD-10-CM | POA: Diagnosis not present

## 2019-09-08 DIAGNOSIS — K920 Hematemesis: Secondary | ICD-10-CM | POA: Diagnosis present

## 2019-09-08 DIAGNOSIS — I480 Paroxysmal atrial fibrillation: Secondary | ICD-10-CM | POA: Diagnosis present

## 2019-09-08 DIAGNOSIS — Z20822 Contact with and (suspected) exposure to covid-19: Secondary | ICD-10-CM | POA: Diagnosis present

## 2019-09-08 DIAGNOSIS — Z6841 Body Mass Index (BMI) 40.0 and over, adult: Secondary | ICD-10-CM | POA: Diagnosis not present

## 2019-09-08 DIAGNOSIS — K449 Diaphragmatic hernia without obstruction or gangrene: Secondary | ICD-10-CM | POA: Diagnosis present

## 2019-09-08 DIAGNOSIS — I959 Hypotension, unspecified: Secondary | ICD-10-CM | POA: Diagnosis not present

## 2019-09-08 DIAGNOSIS — D371 Neoplasm of uncertain behavior of stomach: Secondary | ICD-10-CM | POA: Diagnosis present

## 2019-09-08 DIAGNOSIS — D751 Secondary polycythemia: Secondary | ICD-10-CM | POA: Diagnosis present

## 2019-09-08 DIAGNOSIS — D62 Acute posthemorrhagic anemia: Secondary | ICD-10-CM | POA: Diagnosis present

## 2019-09-08 DIAGNOSIS — I5022 Chronic systolic (congestive) heart failure: Secondary | ICD-10-CM | POA: Diagnosis not present

## 2019-09-08 DIAGNOSIS — I5082 Biventricular heart failure: Secondary | ICD-10-CM | POA: Diagnosis present

## 2019-09-08 DIAGNOSIS — I484 Atypical atrial flutter: Secondary | ICD-10-CM | POA: Diagnosis present

## 2019-09-08 DIAGNOSIS — I2729 Other secondary pulmonary hypertension: Secondary | ICD-10-CM | POA: Diagnosis not present

## 2019-09-08 LAB — SARS CORONAVIRUS 2 BY RT PCR (HOSPITAL ORDER, PERFORMED IN ~~LOC~~ HOSPITAL LAB): SARS Coronavirus 2: NEGATIVE

## 2019-09-08 LAB — PROTIME-INR
INR: 1.3 — ABNORMAL HIGH (ref 0.8–1.2)
Prothrombin Time: 15.7 seconds — ABNORMAL HIGH (ref 11.4–15.2)

## 2019-09-08 LAB — CBG MONITORING, ED
Glucose-Capillary: 84 mg/dL (ref 70–99)
Glucose-Capillary: 80 mg/dL (ref 70–99)

## 2019-09-08 LAB — POTASSIUM: Potassium: 4.8 mmol/L (ref 3.5–5.1)

## 2019-09-08 LAB — CBC
HCT: 42.9 % (ref 39.0–52.0)
Hemoglobin: 11.4 g/dL — ABNORMAL LOW (ref 13.0–17.0)
MCH: 27.6 pg (ref 26.0–34.0)
MCHC: 26.6 g/dL — ABNORMAL LOW (ref 30.0–36.0)
MCV: 103.9 fL — ABNORMAL HIGH (ref 80.0–100.0)
Platelets: 172 10*3/uL (ref 150–400)
RBC: 4.13 MIL/uL — ABNORMAL LOW (ref 4.22–5.81)
RDW: 16.6 % — ABNORMAL HIGH (ref 11.5–15.5)
WBC: 15.5 10*3/uL — ABNORMAL HIGH (ref 4.0–10.5)
nRBC: 0 % (ref 0.0–0.2)

## 2019-09-08 LAB — GLUCOSE, RANDOM: Glucose, Bld: 174 mg/dL — ABNORMAL HIGH (ref 70–99)

## 2019-09-08 LAB — HEMOGLOBIN AND HEMATOCRIT, BLOOD
HCT: 42.3 % (ref 39.0–52.0)
Hemoglobin: 11.2 g/dL — ABNORMAL LOW (ref 13.0–17.0)

## 2019-09-08 LAB — ABO/RH: ABO/RH(D): O POS

## 2019-09-08 MED ORDER — SODIUM BICARBONATE 8.4 % IV SOLN
50.0000 meq | Freq: Once | INTRAVENOUS | Status: AC
  Administered 2019-09-08: 50 meq via INTRAVENOUS
  Filled 2019-09-08: qty 50

## 2019-09-08 MED ORDER — ONDANSETRON HCL 4 MG PO TABS: 4.0000 mg | ORAL_TABLET | Freq: Four times a day (QID) | ORAL | Status: DC | PRN

## 2019-09-08 MED ORDER — DEXTROSE 50 % IV SOLN
1.0000 | Freq: Once | INTRAVENOUS | Status: AC
  Administered 2019-09-08: 50 mL via INTRAVENOUS
  Filled 2019-09-08: qty 50

## 2019-09-08 MED ORDER — ALBUTEROL SULFATE (2.5 MG/3ML) 0.083% IN NEBU: 2.5000 mg | INHALATION_SOLUTION | Freq: Four times a day (QID) | RESPIRATORY_TRACT | Status: DC | PRN

## 2019-09-08 MED ORDER — DEXTROSE 10 % IV SOLN: Freq: Once | INTRAVENOUS | Status: DC

## 2019-09-08 MED ORDER — SUCRALFATE 1 GM/10ML PO SUSP
1.0000 g | Freq: Three times a day (TID) | ORAL | Status: DC
  Administered 2019-09-08 – 2019-09-09 (×3): 1 g via ORAL
  Filled 2019-09-08 (×5): qty 10

## 2019-09-08 MED ORDER — CARVEDILOL 25 MG PO TABS
25.0000 mg | ORAL_TABLET | Freq: Two times a day (BID) | ORAL | Status: DC
  Administered 2019-09-08: 25 mg via ORAL
  Filled 2019-09-08: qty 1

## 2019-09-08 MED ORDER — ALBUTEROL SULFATE HFA 108 (90 BASE) MCG/ACT IN AERS
1.0000 | INHALATION_SPRAY | Freq: Four times a day (QID) | RESPIRATORY_TRACT | Status: DC | PRN
  Filled 2019-09-08: qty 6.7

## 2019-09-08 MED ORDER — ONDANSETRON HCL 4 MG/2ML IJ SOLN
4.0000 mg | Freq: Four times a day (QID) | INTRAMUSCULAR | Status: DC | PRN
  Administered 2019-09-09: 4 mg via INTRAVENOUS
  Filled 2019-09-08: qty 2

## 2019-09-08 MED ORDER — INSULIN ASPART 100 UNIT/ML IV SOLN
5.0000 [IU] | Freq: Once | INTRAVENOUS | Status: AC
  Administered 2019-09-08: 5 [IU] via INTRAVENOUS

## 2019-09-08 MED ORDER — ACETAMINOPHEN 325 MG PO TABS
650.0000 mg | ORAL_TABLET | Freq: Four times a day (QID) | ORAL | Status: DC | PRN
  Administered 2019-09-08 – 2019-09-12 (×5): 650 mg via ORAL
  Filled 2019-09-08 (×5): qty 2

## 2019-09-08 MED ORDER — SODIUM CHLORIDE 0.9 % IV BOLUS: 500.0000 mL | Freq: Once | INTRAVENOUS | Status: DC

## 2019-09-08 MED ORDER — PANTOPRAZOLE SODIUM 40 MG IV SOLR
40.0000 mg | Freq: Two times a day (BID) | INTRAVENOUS | Status: DC
  Administered 2019-09-08 – 2019-09-09 (×4): 40 mg via INTRAVENOUS
  Filled 2019-09-08 (×4): qty 40

## 2019-09-08 MED ORDER — ALBUTEROL SULFATE (2.5 MG/3ML) 0.083% IN NEBU
10.0000 mg | INHALATION_SOLUTION | Freq: Once | RESPIRATORY_TRACT | Status: AC
  Administered 2019-09-08: 10 mg via RESPIRATORY_TRACT
  Filled 2019-09-08: qty 12

## 2019-09-08 MED ORDER — ATORVASTATIN CALCIUM 80 MG PO TABS
80.0000 mg | ORAL_TABLET | Freq: Every day | ORAL | Status: DC
  Administered 2019-09-08 – 2019-09-12 (×4): 80 mg via ORAL
  Filled 2019-09-08 (×2): qty 1
  Filled 2019-09-08: qty 2
  Filled 2019-09-08: qty 1

## 2019-09-08 MED ORDER — ACETAMINOPHEN 650 MG RE SUPP: 650.0000 mg | Freq: Four times a day (QID) | RECTAL | Status: DC | PRN

## 2019-09-08 MED ORDER — HYDRALAZINE HCL 25 MG PO TABS
25.0000 mg | ORAL_TABLET | Freq: Three times a day (TID) | ORAL | Status: DC
  Administered 2019-09-08 (×2): 25 mg via ORAL
  Filled 2019-09-08 (×2): qty 1

## 2019-09-08 MED ORDER — POLYETHYLENE GLYCOL 3350 17 G PO PACK: 17.0000 g | PACK | Freq: Every day | ORAL | Status: DC | PRN

## 2019-09-08 MED ORDER — SODIUM ZIRCONIUM CYCLOSILICATE 10 G PO PACK
10.0000 g | PACK | Freq: Once | ORAL | Status: AC
  Administered 2019-09-08: 10 g via ORAL
  Filled 2019-09-08: qty 1

## 2019-09-08 NOTE — Progress Notes (Addendum)
PROGRESS NOTE    Douglas [name].  FMB:[id number] DOB: 01/04/1986 DOA: 09/07/2019 PCP: Patient, No Pcp Per    Brief Narrative:  The patient admitted to the hospital with a working diagnosis of melena, suspected acute gastrointestinal bleed, hospitalization complicated acute on chronic hypoxic respiratory failure  34 year old male with past medical history for heart failure, severe obstructive sleep apnea, pulmonary hypertension, polycythemia, chronic hypoxic respiratory failure, history of left MCA stroke, who presents with melena and hematemesis.  Patient reported 24 hours of intermittent melena, about 6 episodes, associated with nausea, vomiting and one episode of coffee-ground emesis.  His initial physical examination was blood pressure 139/71, heart rate 93, respiratory rate 13, oxygen saturation 92%.  His lungs had rhonchi bilaterally, heart S1-S2, present rhythmic, abdomen protuberant, soft nontender, no lower extremity edema. Sodium 139, potassium 6.1, chloride 84, bicarb 46, glucose 103, BUN 86, creatinine 1.92, white count 11.8, hemoglobin 12.5, hematocrit 46.9, platelets 171.  SARS COVID-19 negative.  Urinalysis specific gravity 1.009, negative leukocytes. Chest radiograph with cardiomegaly, no infiltrates, prominent pulmonary arteries. EKG 83 bpm, rightward axis, normal intervals, sinus rhythm, no ST segment or T wave changes, low voltage.  While in the Ed, patient had worsening hypoxemia, more severe when sleeping, he did not tolerate bipap and was placed on non re-breather mask with improvement in oxygenation.   Assessment & Plan:   Principal Problem:   Acute blood loss anemia Active Problems:   OSA treated with BiPAP   Essential hypertension   Acute hyperkalemia   Biventricular congestive heart failure (HCC)   Chronic respiratory failure with hypoxia (HCC)   Pulmonary hypertension with chronic cor pulmonale (HCC)    1. Acute blood loss anemia, suspected acute  gastrointestinal in origin/ upper. Patient with significant drop in is Hgb compared to numbers pre-admission, this Hgb is stable at 11,4.   Will continue medical therapy with IV pantoprazole and will follow on H&H. Continue clear diet for now and will follow with GI recommendations.  For now will hold on anticoagulation, and continue close telemetry monitoring.   Check iron panel.   2. Acute on chronic hypoxic respiratory failure. Pulmonary HTN, OSA, obesity class 3. Chest film with no infiltrates, patient has been on anticoagulation with apixaban at home.   Will continue supplemental 02 to keep oxygen saturation more than 92%, will add incentive spirometer q 4 H while awake. No signs of pneumonia or pulmonary edema. Continue with bronchodilator as needed.    3. AKI on CKD stage 2, with Hyperkalemia. Patient has received initial treatment in the ED, follow up K is 4.8. Renal function with serum cr at 1,9.  Will continue to hold on diuretics for now and will follow up on renal panel in am, continue with telemetry monitoring.   4. Diastolic heart failure. Echocardiogram from 04/2019 with preserved LV systolc function but with severely reduced RV systolic function. No clinical signs of exacerbation.  Continue b blockade with carvedilol and after load reduction with hydralazine.   5. HTN. Continue blood pressure monitoring.   6. Obesity class 3. Weight is 126.59 Kg. Will need outpatient follow up.   7. Dyslipidemia. Continue with atorvastatin.   Patient continue to be at high risk for worsening anemia.   Status is: Observation  The patient will require care spanning > 2 midnights and should be moved to inpatient because: Inpatient level of care appropriate due to severity of illness. Patient with worsening anemia and respiratory failure, will need further inpatient care for diagnosis  and management.   Dispo: The patient is from: Home              Anticipated d/c is to: Home               Anticipated d/c date is: 3 days              Patient currently is not medically stable to d/c.   DVT prophylaxis: scd   Code Status:   full  Family Communication:  No family at the bedside       Consultants:   GI    Subjective: Patient continue to have dyspnea, no nausea or vomiting, no abdominal pain. Mild confusion, most of the information from nursing at the bedside/  Objective: Vitals:   09/08/19 0350 09/08/19 0417 09/08/19 0540 09/08/19 0714  BP:  102/60  (!) 105/57  Pulse:  100 93 99  Resp:  20 13 20   Temp:      TempSrc:      SpO2: 94% 97% 92% (!) 85%    Intake/Output Summary (Last 24 hours) at 09/08/2019 0941 Last data filed at 09/08/2019 0210 Gross per 24 hour  Intake 100 ml  Output --  Net 100 ml   There were no vitals filed for this visit.  Examination:   General: deconditioned and ill looking appearing  Neurology: Awake and alert, non focal  E ENT: no pallor, no icterus, oral mucosa moist Cardiovascular: No JVD (short and wide neck). S1-S2 present, rhythmic, no gallops, rubs, or murmurs. trace lower extremity edema. Pulmonary: positive breath sounds bilaterally, no wheezing, rales or rhonchi Gastrointestinal. Abdomen soft and non tender, positive distention Skin. No rashes Musculoskeletal: no joint deformities     Data Reviewed: I have personally reviewed following labs and imaging studies  CBC: Recent Labs  Lab 09/07/19 1615 09/08/19 0434  WBC 11.8*  --   HGB 12.5* 11.2*  HCT 46.9 42.3  MCV 102.6*  --   PLT 171  --    Basic Metabolic Panel: Recent Labs  Lab 09/07/19 1615 09/08/19 0434 09/08/19 0435  NA 139  --   --   K 6.1* 4.8  --   CL 84*  --   --   CO2 46*  --   --   GLUCOSE 103*  --  174*  BUN 86*  --   --   CREATININE 1.92*  --   --   CALCIUM 8.6*  --   --    GFR: Estimated Creatinine Clearance: 64.8 mL/min (A) (by C-G formula based on SCr of 1.92 mg/dL (H)). Liver Function Tests: Recent Labs  Lab 09/07/19 1615  AST  23  ALT 19  ALKPHOS 77  BILITOT 2.0*  PROT 5.3*  ALBUMIN 2.6*   Recent Labs  Lab 09/07/19 1615  LIPASE 21   No results for input(s): AMMONIA in the last 168 hours. Coagulation Profile: Recent Labs  Lab 09/08/19 0434  INR 1.3*   Cardiac Enzymes: No results for input(s): CKTOTAL, CKMB, CKMBINDEX, TROPONINI in the last 168 hours. BNP (last 3 results) No results for input(s): PROBNP in the last 8760 hours. HbA1C: No results for input(s): HGBA1C in the last 72 hours. CBG: Recent Labs  Lab 09/08/19 0659  GLUCAP 84   Lipid Profile: No results for input(s): CHOL, HDL, LDLCALC, TRIG, CHOLHDL, LDLDIRECT in the last 72 hours. Thyroid Function Tests: No results for input(s): TSH, T4TOTAL, FREET4, T3FREE, THYROIDAB in the last 72 hours. Anemia Panel: No results for input(s):  VITAMINB12, FOLATE, FERRITIN, TIBC, IRON, RETICCTPCT in the last 72 hours.    Radiology Studies: I have reviewed all of the imaging during this hospital visit personally     Scheduled Meds: Continuous Infusions: . pantoprozole (PROTONIX) infusion 8 mg/hr (09/08/19 0211)     LOS: 0 days        Alphonzo Devera Gerome Apley, MD

## 2019-09-08 NOTE — ED Notes (Signed)
Hospital bed ordered.

## 2019-09-08 NOTE — Progress Notes (Signed)
NEW ADMISSION NOTE  Arrival Method: bed Mental Orientation: oriented to self, poor historian Telemetry: yes Assessment: Completed Skin: Intact Iv: right hand Pain: 0 Tubes: 1 Safety Measures: Safety Fall Prevention, Bed in lowest position, bed alarm on, call bell within reach Admission: Completed [facility] Orientation: Patient has been orientated to the room, unit and staff.  Family: 0  Orders have been reviewed and implemented. Will continue to monitor the patient. Call light has been placed within reach and bed alarm has been activated.  Patient is a poor historian and unable to answer admitting questions  Beatris Ship, RN

## 2019-09-08 NOTE — ED Notes (Signed)
Assisted up to bedside commode per emt, moderate size dark tarry stool.

## 2019-09-08 NOTE — H&P (Signed)
History and Physical    Douglas D Lill Jr. FUX:[id number] DOB: 11/25/1985 DOA: 09/07/2019  PCP: Patient, No Pcp Per  Patient coming from: Home   Chief Complaint:  Chief Complaint  Patient presents with  . Melena     HPI:    34 year old male with past medical history of biventricular heart failure (diastolic dysfunction with cor pulmonale), severe obstructive sleep apnea, polycythemia, pulmonary hypertension, chronic respiratory failure on 2 to 3 L at baseline, left MCA stroke due to paradoxical emboli, PFO's closure 10/2019 who presents to [facility] emergency department with complaints of melena and hematemesis.  Patient explains that the morning of 8/14 he began to exhibit episodes of melena.  Patient states that he experienced approximately 6 separate episodes of melena.  Patient denies any associated abdominal pain, lightheadedness, shortness of breath, palpitations or chest pain with these episodes.  As the patient's symptoms persisted throughout the day, the patient began to develop nausea and experienced 1 episode of coffee-ground emesis.  It was at this point that the patient called EMS to be brought into [facility] for evaluation.  Upon further questioning, patient denies any alcohol use or frequent NSAID use.  Of note, patient is on chronic anticoagulation with Eliquis due to his history of paradoxical embolus 09/2018.  Upon arrival to the emergency department, fecal occult blood was found to be positive.  Hemoglobin was found to be 12.5, down from sixteen 1 month ago.  Patient was also found to have a markedly elevated BUN of 86 consistent with upper gastrointestinal bleeding.  Additionally, patient was found to have substantial hyperkalemia initially at 6.1 with follow-up potassium level found to be 7.0 without EKG changes.  Patient was initiated on albuterol, insulin, dextrose and Lokelma.  Hospitalist group was then called to assess the patient for admission  to the hospital.   Review of Systems:   Review of Systems  Constitutional: Positive for malaise/fatigue.  Gastrointestinal: Positive for melena.  All other systems reviewed and are negative.     Past Medical History:  Diagnosis Date  . Arthritis    "hands, feet" (10/31/2013)  . CHF (congestive heart failure) ([place])   . Chronic bronchitis ([place])   . High cholesterol   . Hypertension   . Obesity   . OSA on CPAP   . Pneumonia 09/2011  . Substance abuse ([place]) 8/31/213   cociane "first time use"  . Tobacco abuse     Past Surgical History:  Procedure Laterality Date  . BUBBLE STUDY  10/15/2018   Procedure: BUBBLE STUDY;  Surgeon: Larey Dresser, MD;  Location: [place];  Service: Cardiovascular;;  . CARDIOVERSION N/A 07/15/2019   Procedure: CARDIOVERSION;  Surgeon: Larey Dresser, MD;  Location: [facility] ENDOSCOPY;  Service: Cardiovascular;  Laterality: N/A;  . CARDIOVERSION N/A 08/14/2019   Procedure: CARDIOVERSION;  Surgeon: Larey Dresser, MD;  Location: [facility] ENDOSCOPY;  Service: Cardiovascular;  Laterality: N/A;  . NO PAST SURGERIES    . PATENT FORAMEN OVALE(PFO) CLOSURE N/A 05/16/2019   Procedure: PATENT FORAMEN OVALE (PFO) CLOSURE;  Surgeon: Sherren Mocha, MD;  Location: [place] CV LAB;  Service: Cardiovascular;  Laterality: N/A;  . TEE WITHOUT CARDIOVERSION N/A 10/15/2018   Procedure: TRANSESOPHAGEAL ECHOCARDIOGRAM (TEE);  Surgeon: Larey Dresser, MD;  Location: [facility] ENDOSCOPY;  Service: Cardiovascular;  Laterality: N/A;  . TEE WITHOUT CARDIOVERSION N/A 07/15/2019   Procedure: TRANSESOPHAGEAL ECHOCARDIOGRAM (TEE);  Surgeon: Larey Dresser, MD;  Location: [facility] ENDOSCOPY;  Service: Cardiovascular;  Laterality: N/A;  .  TUBES IN EARS       reports that he quit smoking about 5 years ago. His smoking use included cigarettes. He has a 4.50 pack-year smoking history. He has never used smokeless tobacco. He reports that he does not drink alcohol and does not use  drugs.  Allergies  Allergen Reactions  . Lisinopril Cough    Family History  Problem Relation Age of Onset  . Heart disease Mother        Pacemaker  . Hypertension Father   . Cancer Paternal Grandfather   . Heart attack Neg Hx   . Stroke Neg Hx      Prior to Admission medications   Medication Sig Start Date End Date Taking? Authorizing Provider  albuterol (PROVENTIL HFA) 108 (90 Base) MCG/ACT inhaler Inhale 1-2 puffs into the lungs every 6 (six) hours as needed for wheezing or shortness of breath. 05/23/19   Larey Dresser, MD  atorvastatin (LIPITOR) 80 MG tablet Take 1 tablet (80 mg total) by mouth daily at 6 PM. 10/17/18   Earlene Plater, MD  brimonidine (ALPHAGAN) 0.2 % ophthalmic solution Place 1 drop into both eyes in the morning and at bedtime. Patient not taking: Reported on 09/06/2019    [provider]  carvedilol (COREG) 25 MG tablet Take 1 tablet (25 mg total) by mouth 2 (two) times daily with a meal. 08/08/19   Clegg, [name] D, NP  ELIQUIS 5 MG TABS tablet TAKE 1 TABLET (5 MG TOTAL) BY MOUTH 2 (TWO) TIMES DAILY. 08/26/19   Larey Dresser, MD  hydrALAZINE (APRESOLINE) 25 MG tablet TAKE 1 TABLET (25 MG TOTAL) BY MOUTH 3 (THREE) TIMES DAILY. 07/19/19   Larey Dresser, MD  OXYGEN Inhale 2 L/min into the lungs as needed (shortness of breath).     [provider]  sildenafil (VIAGRA) 50 MG tablet Take 1 tablet (50 mg total) by mouth daily as needed for erectile dysfunction. Patient not taking: Reported on 08/15/2019 04/12/19   Larey Dresser, MD  torsemide (DEMADEX) 20 MG tablet Take 4 tablets (80 mg total) by mouth in the morning AND 3 tablets (60 mg total) every evening. 08/14/19   Larey Dresser, MD    Physical Exam: Vitals:   09/08/19 3435 09/08/19 0350 09/08/19 0417 09/08/19 0540  BP: 139/71  102/60   Pulse:   100 93  Resp:   20 13  Temp:      TempSrc:      SpO2:  94% 97% 92%    Constitutional: Lethargic but arousable and oriented x3, no  associated distress.  Patient is obese. Skin: no rashes, no lesions, good skin turgor noted. Eyes: Pupils are equally reactive to light.  No evidence of scleral icterus or conjunctival pallor.  ENMT: Moist mucous membranes noted.  Posterior pharynx clear of any exudate or lesions.   Neck: normal, supple, no masses, no thyromegaly.  No evidence of jugular venous distension.   Respiratory: Scattered rhonchi bilaterally, clear to auscultation bilaterally, no wheezing, no crackles. Normal respiratory effort. No accessory muscle use.  Cardiovascular: Regular rate and rhythm, no murmurs / rubs / gallops. No extremity edema. 2+ pedal pulses. No carotid bruits.  Chest:   Nontender without crepitus or deformity.   Back:   Nontender without crepitus or deformity. Abdomen: Abdomen is protuberant but soft and nontender.  No evidence of intra-abdominal masses.  Positive bowel sounds noted in all quadrants.   Musculoskeletal: No joint deformity upper and lower extremities. Good ROM, no  contractures. Normal muscle tone.  Neurologic: CN 2-12 grossly intact. Sensation intact, strength noted to be 5 out of 5 in all 4 extremities.  Patient is following all commands.  Patient is responsive to verbal stimuli.   Psychiatric: Patient presents as a normal mood with appropriate affect.  Patient seems to possess insight as to theircurrent situation.     Labs on Admission: I have personally reviewed following labs and imaging studies -   CBC: Recent Labs  Lab 09/07/19 1615 09/08/19 0434  WBC 11.8*  --   HGB 12.5* 11.2*  HCT 46.9 42.3  MCV 102.6*  --   PLT 171  --    Basic Metabolic Panel: Recent Labs  Lab 09/07/19 1615 09/08/19 0434 09/08/19 0435  NA 139  --   --   K 6.1* 4.8  --   CL 84*  --   --   CO2 46*  --   --   GLUCOSE 103*  --  174*  BUN 86*  --   --   CREATININE 1.92*  --   --   CALCIUM 8.6*  --   --    GFR: Estimated Creatinine Clearance: 64.8 mL/min (A) (by C-G formula based on SCr of  1.92 mg/dL (H)). Liver Function Tests: Recent Labs  Lab 09/07/19 1615  AST 23  ALT 19  ALKPHOS 77  BILITOT 2.0*  PROT 5.3*  ALBUMIN 2.6*   Recent Labs  Lab 09/07/19 1615  LIPASE 21   No results for input(s): AMMONIA in the last 168 hours. Coagulation Profile: Recent Labs  Lab 09/08/19 0434  INR 1.3*   Cardiac Enzymes: No results for input(s): CKTOTAL, CKMB, CKMBINDEX, TROPONINI in the last 168 hours. BNP (last 3 results) No results for input(s): PROBNP in the last 8760 hours. HbA1C: No results for input(s): HGBA1C in the last 72 hours. CBG: No results for input(s): GLUCAP in the last 168 hours. Lipid Profile: No results for input(s): CHOL, HDL, LDLCALC, TRIG, CHOLHDL, LDLDIRECT in the last 72 hours. Thyroid Function Tests: No results for input(s): TSH, T4TOTAL, FREET4, T3FREE, THYROIDAB in the last 72 hours. Anemia Panel: No results for input(s): VITAMINB12, FOLATE, FERRITIN, TIBC, IRON, RETICCTPCT in the last 72 hours. Urine analysis:    Component Value Date/Time   COLORURINE STRAW (A) 09/07/2019 2119   APPEARANCEUR CLEAR 09/07/2019 2119   LABSPEC 1.009 09/07/2019 2119   PHURINE 7.0 09/07/2019 2119   GLUCOSEU NEGATIVE 09/07/2019 2119   HGBUR NEGATIVE 09/07/2019 2119   [place] NEGATIVE 09/07/2019 2119   [place] NEGATIVE 09/07/2019 2119   PROTEINUR NEGATIVE 09/07/2019 2119   UROBILINOGEN 1.0 06/09/2014 1527   NITRITE NEGATIVE 09/07/2019 2119   LEUKOCYTESUR NEGATIVE 09/07/2019 2119    Radiological Exams on Admission - Personally Reviewed: DG Chest 2 View  Result Date: 09/07/2019 CLINICAL DATA:  Patient coughing up blood. EXAM: CHEST - 2 VIEW COMPARISON:  Chest radiograph October 09, 2018. FINDINGS: Marked cardiomegaly. Masslike structure right hilum most compatible with enlarged pulmonary artery. No large area of pulmonary consolidation. No pleural effusion or pneumothorax. Osseous structures unremarkable. IMPRESSION: 1. Cardiomegaly. 2. Enlarged  pulmonary artery. Electronically Signed   By: Lovey Newcomer M.D.   On: 09/07/2019 16:56    EKG: Personally reviewed.  Rhythm is normal sinus rhythm with heart rate of 93 bpm.  T wave inversions noted in the anterior septal leads.  Prolonged QTC of 532.    Assessment/Plan Principal Problem:   Acute blood loss anemia   Patient presenting with 1 day  history of over 5 episodes of melena followed by 1 episode of coffee-ground hematemesis.  Patient additionally presenting with markedly elevated BUN of 86 and was found to have a positive Hemoccult in the emergency department all supportive of upper gastrointestinal bleeding.  Considering that this is likely an upper gastrointestinal bleed secondary to Eliquis use, patient has been placed on Protonix 80 mg x 1 followed by 40 mg IV every 12.  While notably, patient's current hemoglobin is 12.5, this is a substantial drop from the patient's baseline of 16-20 (patient has known polycythemia)  Hydrating patient with small boluses of isotonic fluids to avoid volume overload  Emergency department provider has already messaged overnight gastroenterologist via epic secure chat for gastroenterology consultation this morning.  Patient will be kept n.p.o.  Eliquis has been held as has any pharmacologic DVT prophylaxis.  Cycling CBCs to monitor hemoglobin and hematocrit.  Active Problems:   Acute hyperkalemia   Initial potassium of 6.1 on arrival to the emergency department with repeat potassium of 7  I am uncertain as to the accuracy of the potassium of 7 considering that this was an i-STAT sample.  Agree with the emergency department strategy of providing Lokelma, dextrose, insulin and albuterol.  Additionally providing patient with measured amounts of small isotonic fluid boluses.  Repeat potassium is pending.    OSA treated with BiPAP   Patient has a longstanding known history of severe obstructive sleep apnea that has been treated with  BiPAP in the past.  Patient reports that he no longer has BiPAP at home and is unable to get it due to financial constraints.  Will place case management consultation to see if they can assist in this matter.  Considering patient's known history of biventricular failure and cor pulmonale with pulmonary hypertension I feel that it is extremely important that the patient have a home-going BiPAP device secured.    Essential hypertension   Continue home regimen of antihypertensive therapy    Biventricular congestive heart failure (HCC)   No clinical evidence of volume overload at this time  Monitoring closely seeing as how we are providing patient with small amounts of isotonic fluid resuscitation.  Temporarily holding diuretics which should be quickly restarted once patient is felt to be euvolemic.    Chronic respiratory failure with hypoxia (HCC)   Continue home regimen of 2 to 3 L of oxygen via nasal cannula.    Pulmonary hypertension with chronic cor pulmonale (HCC)    Severe disease resulting in chronic respiratory failure  At time of discharge will ensure patient has outpatient follow-up with pulmonology as well as having a home-going BiPAP device.   Code Status:  Full code Family Communication: deferred   Status is: Observation  The patient remains OBS appropriate and will d/c before 2 midnights.  Dispo: The patient is from: Home              Anticipated d/c is to: Home              Anticipated d/c date is: 2 days              Patient currently is not medically stable to d/c.        Vernelle Emerald MD [facility] Pager [phone]  If 7PM-7AM, please contact night-coverage [email or web address] Use universal [place] password for that web site. If you do not have the password, please call the hospital operator.  09/08/2019, 5:52 AM

## 2019-09-08 NOTE — Progress Notes (Signed)
Patient placed on BIPAP through dreamstation. Patient tolerating at this time.

## 2019-09-08 NOTE — ED Notes (Signed)
Patient sitting up on the stretcher confused taking his 02 mask off , he will watch the monitor and when his sat drops he will put his mask back on. Pulling at things in the air. Having VH.

## 2019-09-08 NOTE — Consult Note (Signed)
Referring Provider:  EDP Primary Care Physician:  Patient, No Pcp Per Primary Gastroenterologist: Althia Forts  Reason for Consultation: Upper GI bleed  HPI: Douglas [name]. is a 34 y.o. male with past medical history of CHF with preserved EF, history of chronic respiratory failure on 2 to 3 L oxygen, history of cor pulmonale, history of CVA sleep apnea presented to the hospital with coffee-ground emesis and melena.  Patient takes Eliquis for history of paradoxical embolus.  Upon initial evaluation, he was found to have hemoglobin of 12.5, normal platelets count, normal LFTs.  Mild elevated creatinine at 1.92, normal lipase.  Occult blood positive.  GI is consulted for further evaluation.  Patient seen and examined at bedside in the emergency room.  Patient is not able to give detailed history.  Discussed with RN at bedside.  No further bleeding episodes since 3 AM this morning.  Denies any further melena.  Patient is currently on high flow oxygen because of apneic spells and hypoxemia.  Past Medical History:  Diagnosis Date  . Arthritis    "hands, feet" (10/31/2013)  . CHF (congestive heart failure) ([place])   . Chronic bronchitis ([place])   . High cholesterol   . Hypertension   . Obesity   . OSA on CPAP   . Pneumonia 09/2011  . Substance abuse ([place]) 8/31/213   cociane "first time use"  . Tobacco abuse     Past Surgical History:  Procedure Laterality Date  . BUBBLE STUDY  10/15/2018   Procedure: BUBBLE STUDY;  Surgeon: Larey Dresser, MD;  Location: [place];  Service: Cardiovascular;;  . CARDIOVERSION N/A 07/15/2019   Procedure: CARDIOVERSION;  Surgeon: Larey Dresser, MD;  Location: [facility] ENDOSCOPY;  Service: Cardiovascular;  Laterality: N/A;  . CARDIOVERSION N/A 08/14/2019   Procedure: CARDIOVERSION;  Surgeon: Larey Dresser, MD;  Location: [facility] ENDOSCOPY;  Service: Cardiovascular;  Laterality: N/A;  . NO PAST SURGERIES    . PATENT FORAMEN OVALE(PFO) CLOSURE N/A 05/16/2019    Procedure: PATENT FORAMEN OVALE (PFO) CLOSURE;  Surgeon: Sherren Mocha, MD;  Location: [place] CV LAB;  Service: Cardiovascular;  Laterality: N/A;  . TEE WITHOUT CARDIOVERSION N/A 10/15/2018   Procedure: TRANSESOPHAGEAL ECHOCARDIOGRAM (TEE);  Surgeon: Larey Dresser, MD;  Location: [facility] ENDOSCOPY;  Service: Cardiovascular;  Laterality: N/A;  . TEE WITHOUT CARDIOVERSION N/A 07/15/2019   Procedure: TRANSESOPHAGEAL ECHOCARDIOGRAM (TEE);  Surgeon: Larey Dresser, MD;  Location: [facility] ENDOSCOPY;  Service: Cardiovascular;  Laterality: N/A;  . TUBES IN EARS      Prior to Admission medications   Medication Sig Start Date End Date Taking? Authorizing Provider  albuterol (PROVENTIL HFA) 108 (90 Base) MCG/ACT inhaler Inhale 1-2 puffs into the lungs every 6 (six) hours as needed for wheezing or shortness of breath. 05/23/19   Larey Dresser, MD  atorvastatin (LIPITOR) 80 MG tablet Take 1 tablet (80 mg total) by mouth daily at 6 PM. 10/17/18   Earlene Plater, MD  brimonidine (ALPHAGAN) 0.2 % ophthalmic solution Place 1 drop into both eyes in the morning and at bedtime. Patient not taking: Reported on 09/06/2019    [provider]  carvedilol (COREG) 25 MG tablet Take 1 tablet (25 mg total) by mouth 2 (two) times daily with a meal. 08/08/19   Clegg, [name] D, NP  ELIQUIS 5 MG TABS tablet TAKE 1 TABLET (5 MG TOTAL) BY MOUTH 2 (TWO) TIMES DAILY. 08/26/19   Larey Dresser, MD  hydrALAZINE (APRESOLINE) 25 MG tablet TAKE 1 TABLET (  25 MG TOTAL) BY MOUTH 3 (THREE) TIMES DAILY. 07/19/19   Larey Dresser, MD  OXYGEN Inhale 2 L/min into the lungs as needed (shortness of breath).     [provider]  sildenafil (VIAGRA) 50 MG tablet Take 1 tablet (50 mg total) by mouth daily as needed for erectile dysfunction. Patient not taking: Reported on 08/15/2019 04/12/19   Larey Dresser, MD  torsemide (DEMADEX) 20 MG tablet Take 4 tablets (80 mg total) by mouth in the morning AND 3 tablets (60 mg total) every  evening. 08/14/19   Larey Dresser, MD    Scheduled Meds: Continuous Infusions: . pantoprozole (PROTONIX) infusion 8 mg/hr (09/08/19 0211)   PRN Meds:.  Allergies as of 09/07/2019 - Review Complete 09/07/2019  Allergen Reaction Noted  . Lisinopril Cough 09/01/2015    Family History  Problem Relation Age of Onset  . Heart disease Mother        Pacemaker  . Hypertension Father   . Cancer Paternal Grandfather   . Heart attack Neg Hx   . Stroke Neg Hx     Social History   Socioeconomic History  . Marital status: Single    Spouse name: Not on file  . Number of children: 2  . Years of education: Not on file  . Highest education level: Not on file  Occupational History  . Occupation: N/A    Employer: NOT EMPLOYED  Tobacco Use  . Smoking status: Former Smoker    Packs/day: 1.50    Years: 3.00    Pack years: 4.50    Types: Cigarettes    Quit date: 10/31/2013    Years since quitting: 5.8  . Smokeless tobacco: Never Used  Substance and Sexual Activity  . Alcohol use: No    Alcohol/week: 1.0 standard drink    Types: 1 Cans of beer per week  . Drug use: No    Types: Cocaine    Comment: 10/31/2013 "might use cocaine once/month" - Has not used since 2015.   Marland Kitchen Sexual activity: Never  Other Topics Concern  . Not on file  Social History Narrative  . Not on file   Social Determinants of Health   Financial Resource Strain:   . Difficulty of Paying Living Expenses:   Food Insecurity:   . Worried About Charity fundraiser in the Last Year:   . Arboriculturist in the Last Year:   Transportation Needs:   . Film/video editor (Medical):   Marland Kitchen Lack of Transportation (Non-Medical):   Physical Activity:   . Days of Exercise per Week:   . Minutes of Exercise per Session:   Stress:   . Feeling of Stress :   Social Connections:   . Frequency of Communication with Friends and Family:   . Frequency of Social Gatherings with Friends and Family:   . Attends Religious  Services:   . Active Member of Clubs or Organizations:   . Attends Archivist Meetings:   Marland Kitchen Marital Status:   Intimate Partner Violence:   . Fear of Current or Ex-Partner:   . Emotionally Abused:   Marland Kitchen Physically Abused:   . Sexually Abused:     Review of Systems: Not able to obtain  Physical Exam: Vital signs: Vitals:   09/08/19 0540 09/08/19 0714  BP:  (!) 105/57  Pulse: 93 99  Resp: 13 20  Temp:    SpO2: 92% (!) 85%     General:   Alert patient  but somewhat confused.  Currently on high flow oxygen HEENT : Normocephalic, atraumatic, extraocular movement intact Lungs: Fine basilar crackles, mild respiratory distress noted Abdomen.  Soft, nontender, nondistended, bowel sounds present Heart:  Regular rate and rhythm; no murmurs, clicks, rubs,  or gallops. Psych.  Not able to obtain Neuro.  Confused Rectal:  Deferred  GI:  Lab Results: Recent Labs    09/07/19 1615 09/08/19 0434  WBC 11.8*  --   HGB 12.5* 11.2*  HCT 46.9 42.3  PLT 171  --    BMET Recent Labs    09/07/19 1615 09/08/19 0434 09/08/19 0435  NA 139  --   --   K 6.1* 4.8  --   CL 84*  --   --   CO2 46*  --   --   GLUCOSE 103*  --  174*  BUN 86*  --   --   CREATININE 1.92*  --   --   CALCIUM 8.6*  --   --    LFT Recent Labs    09/07/19 1615  PROT 5.3*  ALBUMIN 2.6*  AST 23  ALT 19  ALKPHOS 77  BILITOT 2.0*   PT/INR Recent Labs    09/08/19 0434  LABPROT 15.7*  INR 1.3*     Studies/Results: DG Chest 2 View  Result Date: 09/07/2019 CLINICAL DATA:  Patient coughing up blood. EXAM: CHEST - 2 VIEW COMPARISON:  Chest radiograph October 09, 2018. FINDINGS: Marked cardiomegaly. Masslike structure right hilum most compatible with enlarged pulmonary artery. No large area of pulmonary consolidation. No pleural effusion or pneumothorax. Osseous structures unremarkable. IMPRESSION: 1. Cardiomegaly. 2. Enlarged pulmonary artery. Electronically Signed   By: Lovey Newcomer M.D.   On:  09/07/2019 16:56    Impression/Plan: -Upper GI bleed with coffee-ground emesis and melena. -History of PFO with paradoxical embolus and stroke in September 2020.  On Eliquis.  -Acute on chronic respiratory failure. -History of CHF -Severe sleep apnea  Recommendations -------------------------- -Hold anticoagulation for today. -Currently not stable enough for endoscopic intervention.  GI team will re- evaluate patient tomorrow. -Continue PPI -Okay to have diet from GI standpoint as tolerated.  Keep n.p.o. past midnight. -Monitor H&H. -GI will follow    LOS: 0 days   Otis Brace  MD, FACP 09/08/2019, 8:37 AM  Contact #  [phone]

## 2019-09-08 NOTE — ED Notes (Signed)
This emt went into pt room to check cbg, pt noted to have taken nrb off and is watching the pulse ox to see how low it goes. Told pt to put oxygen back on due to O2 being 79 at the time. Pt states he knows what to do and to leave him alone. O2 back to 100 with nrb on at this time.

## 2019-09-08 NOTE — ED Notes (Signed)
Called  RN into room and apologized for  Pulling off monitor and o2.

## 2019-09-08 NOTE — ED Notes (Signed)
Patient is very confused trying to get off the stretcher taking his 02 off and his sats drop to 76% , he is talking to his mother and I also spoke with his mother and told her how sick he was. MD aware of status, I was able to finally get him to get back up on the stretcher and he did and reapplied his NRB. Dr. Delphia Grates at bedside and spoke with patient.

## 2019-09-08 NOTE — ED Notes (Signed)
Patient refused lunch tray.

## 2019-09-08 NOTE — ED Notes (Signed)
Dr. Ralene Bathe made aware of istat Chem 8 results.

## 2019-09-09 ENCOUNTER — Inpatient Hospital Stay (HOSPITAL_COMMUNITY): Payer: Medicaid Other

## 2019-09-09 ENCOUNTER — Inpatient Hospital Stay: Payer: Self-pay

## 2019-09-09 DIAGNOSIS — I959 Hypotension, unspecified: Secondary | ICD-10-CM

## 2019-09-09 DIAGNOSIS — I4892 Unspecified atrial flutter: Secondary | ICD-10-CM

## 2019-09-09 DIAGNOSIS — I484 Atypical atrial flutter: Secondary | ICD-10-CM

## 2019-09-09 DIAGNOSIS — I471 Supraventricular tachycardia: Secondary | ICD-10-CM

## 2019-09-09 DIAGNOSIS — N179 Acute kidney failure, unspecified: Secondary | ICD-10-CM

## 2019-09-09 DIAGNOSIS — I5022 Chronic systolic (congestive) heart failure: Secondary | ICD-10-CM

## 2019-09-09 LAB — IRON AND TIBC
Iron: 38 ug/dL — ABNORMAL LOW (ref 45–182)
Saturation Ratios: 13 % — ABNORMAL LOW (ref 17.9–39.5)
TIBC: 290 ug/dL (ref 250–450)
UIBC: 252 ug/dL

## 2019-09-09 LAB — CBC WITH DIFFERENTIAL/PLATELET
Abs Immature Granulocytes: 0.04 10*3/uL (ref 0.00–0.07)
Basophils Absolute: 0 10*3/uL (ref 0.0–0.1)
Basophils Relative: 0 %
Eosinophils Absolute: 0.3 10*3/uL (ref 0.0–0.5)
Eosinophils Relative: 2 %
HCT: 38 % — ABNORMAL LOW (ref 39.0–52.0)
Hemoglobin: 10.1 g/dL — ABNORMAL LOW (ref 13.0–17.0)
Immature Granulocytes: 0 %
Lymphocytes Relative: 10 %
Lymphs Abs: 1.1 10*3/uL (ref 0.7–4.0)
MCH: 27.5 pg (ref 26.0–34.0)
MCHC: 26.6 g/dL — ABNORMAL LOW (ref 30.0–36.0)
MCV: 103.5 fL — ABNORMAL HIGH (ref 80.0–100.0)
Monocytes Absolute: 1 10*3/uL (ref 0.1–1.0)
Monocytes Relative: 9 %
Neutro Abs: 9.1 10*3/uL — ABNORMAL HIGH (ref 1.7–7.7)
Neutrophils Relative %: 79 %
Platelets: 165 10*3/uL (ref 150–400)
RBC: 3.67 MIL/uL — ABNORMAL LOW (ref 4.22–5.81)
RDW: 16.4 % — ABNORMAL HIGH (ref 11.5–15.5)
WBC: 11.6 10*3/uL — ABNORMAL HIGH (ref 4.0–10.5)
nRBC: 0 % (ref 0.0–0.2)

## 2019-09-09 LAB — MAGNESIUM: Magnesium: 1.6 mg/dL — ABNORMAL LOW (ref 1.7–2.4)

## 2019-09-09 LAB — COMPREHENSIVE METABOLIC PANEL
ALT: 12 U/L (ref 0–44)
AST: 22 U/L (ref 15–41)
Albumin: 2.4 g/dL — ABNORMAL LOW (ref 3.5–5.0)
Alkaline Phosphatase: 60 U/L (ref 38–126)
BUN: 74 mg/dL — ABNORMAL HIGH (ref 6–20)
CO2: >50 mmol/L — ABNORMAL HIGH (ref 22–32)
Calcium: 8.8 mg/dL — ABNORMAL LOW (ref 8.9–10.3)
Chloride: 88 mmol/L — ABNORMAL LOW (ref 98–111)
Creatinine, Ser: 1.63 mg/dL — ABNORMAL HIGH (ref 0.61–1.24)
GFR calc Af Amer: >60 mL/min (ref >60)
GFR calc non Af Amer: 53 mL/min — ABNORMAL LOW (ref >60)
Glucose, Bld: 102 mg/dL — ABNORMAL HIGH (ref 70–99)
Potassium: 5.4 mmol/L — ABNORMAL HIGH (ref 3.5–5.1)
Sodium: 147 mmol/L — ABNORMAL HIGH (ref 135–145)
Total Bilirubin: 1.7 mg/dL — ABNORMAL HIGH (ref 0.3–1.2)
Total Protein: 4.7 g/dL — ABNORMAL LOW (ref 6.5–8.1)

## 2019-09-09 LAB — BASIC METABOLIC PANEL
Anion gap: 5 (ref 5–15)
BUN: 54 mg/dL — ABNORMAL HIGH (ref 6–20)
CO2: 49 mmol/L — ABNORMAL HIGH (ref 22–32)
Calcium: 8.6 mg/dL — ABNORMAL LOW (ref 8.9–10.3)
Chloride: 91 mmol/L — ABNORMAL LOW (ref 98–111)
Creatinine, Ser: 1.64 mg/dL — ABNORMAL HIGH (ref 0.61–1.24)
GFR calc Af Amer: >60 mL/min (ref >60)
GFR calc non Af Amer: 53 mL/min — ABNORMAL LOW (ref >60)
Glucose, Bld: 139 mg/dL — ABNORMAL HIGH (ref 70–99)
Potassium: 4.5 mmol/L (ref 3.5–5.1)
Sodium: 145 mmol/L (ref 135–145)

## 2019-09-09 LAB — FERRITIN: Ferritin: 22 ng/mL — ABNORMAL LOW (ref 24–336)

## 2019-09-09 LAB — TRANSFERRIN: Transferrin: 207 mg/dL (ref 180–329)

## 2019-09-09 LAB — BRAIN NATRIURETIC PEPTIDE: B Natriuretic Peptide: 214.9 pg/mL — ABNORMAL HIGH (ref 0.0–100.0)

## 2019-09-09 LAB — MRSA PCR SCREENING: MRSA by PCR: NEGATIVE

## 2019-09-09 LAB — PREPARE RBC (CROSSMATCH)

## 2019-09-09 MED ORDER — SODIUM CHLORIDE 0.9 % IV BOLUS
500.0000 mL | Freq: Once | INTRAVENOUS | Status: AC
  Administered 2019-09-09: 500 mL via INTRAVENOUS

## 2019-09-09 MED ORDER — METOCLOPRAMIDE HCL 5 MG/ML IJ SOLN
10.0000 mg | Freq: Once | INTRAMUSCULAR | Status: AC
  Administered 2019-09-09: 10 mg via INTRAVENOUS
  Filled 2019-09-09: qty 2

## 2019-09-09 MED ORDER — AMIODARONE HCL IN DEXTROSE 360-4.14 MG/200ML-% IV SOLN
30.0000 mg/h | INTRAVENOUS | Status: DC
  Administered 2019-09-09 – 2019-09-10 (×4): 30 mg/h via INTRAVENOUS
  Filled 2019-09-09 (×3): qty 200

## 2019-09-09 MED ORDER — CHLORHEXIDINE GLUCONATE CLOTH 2 % EX PADS
6.0000 | MEDICATED_PAD | Freq: Every day | CUTANEOUS | Status: DC
  Administered 2019-09-09 – 2019-09-13 (×5): 6 via TOPICAL

## 2019-09-09 MED ORDER — SODIUM CHLORIDE 0.9 % IV SOLN
INTRAVENOUS | Status: DC | PRN
  Administered 2019-09-09: 1000 mL via INTRAVENOUS

## 2019-09-09 MED ORDER — MAGNESIUM SULFATE 2 GM/50ML IV SOLN
2.0000 g | Freq: Once | INTRAVENOUS | Status: AC
  Administered 2019-09-09: 2 g via INTRAVENOUS
  Filled 2019-09-09: qty 50

## 2019-09-09 MED ORDER — MAGNESIUM SULFATE 2 GM/50ML IV SOLN: 2.0000 g | Freq: Once | INTRAVENOUS | Status: DC

## 2019-09-09 MED ORDER — SODIUM ZIRCONIUM CYCLOSILICATE 5 G PO PACK
10.0000 g | PACK | Freq: Once | ORAL | Status: AC
  Administered 2019-09-09: 10 g via ORAL
  Filled 2019-09-09: qty 2

## 2019-09-09 MED ORDER — SUCRALFATE 1 GM/10ML PO SUSP
1.0000 g | Freq: Three times a day (TID) | ORAL | Status: DC
  Administered 2019-09-09 – 2019-09-11 (×5): 1 g via ORAL
  Filled 2019-09-09 (×10): qty 10

## 2019-09-09 MED ORDER — PHENYLEPHRINE HCL-NACL 10-0.9 MG/250ML-% IV SOLN
0.0000 ug/min | INTRAVENOUS | Status: DC
  Administered 2019-09-09: 50 ug/min via INTRAVENOUS
  Administered 2019-09-09: 20 ug/min via INTRAVENOUS
  Administered 2019-09-09: 50 ug/min via INTRAVENOUS
  Administered 2019-09-09 (×2): 40 ug/min via INTRAVENOUS
  Administered 2019-09-10: 50 ug/min via INTRAVENOUS
  Administered 2019-09-10: 20 ug/min via INTRAVENOUS
  Filled 2019-09-09 (×7): qty 250

## 2019-09-09 MED ORDER — SODIUM CHLORIDE 0.9 % IV BOLUS
1000.0000 mL | Freq: Once | INTRAVENOUS | Status: AC
  Administered 2019-09-09: 1000 mL via INTRAVENOUS

## 2019-09-09 MED ORDER — SODIUM CHLORIDE 0.9% FLUSH
10.0000 mL | INTRAVENOUS | Status: DC | PRN
  Administered 2019-09-11: 10 mL

## 2019-09-09 MED ORDER — DIGOXIN 0.25 MG/ML IJ SOLN
0.2500 mg | Freq: Once | INTRAMUSCULAR | Status: AC
  Administered 2019-09-09: 0.25 mg via INTRAVENOUS
  Filled 2019-09-09: qty 1

## 2019-09-09 MED ORDER — AMIODARONE HCL IN DEXTROSE 360-4.14 MG/200ML-% IV SOLN
60.0000 mg/h | INTRAVENOUS | Status: AC
  Administered 2019-09-09: 60 mg/h via INTRAVENOUS
  Filled 2019-09-09: qty 200

## 2019-09-09 MED ORDER — SODIUM CHLORIDE 0.9% FLUSH
10.0000 mL | Freq: Two times a day (BID) | INTRAVENOUS | Status: DC
  Administered 2019-09-09: 30 mL
  Administered 2019-09-10: 10 mL

## 2019-09-09 MED ORDER — SODIUM CHLORIDE 0.9% FLUSH: 10.0000 mL | INTRAVENOUS | Status: DC | PRN

## 2019-09-09 MED ORDER — DIGOXIN 0.25 MG/ML IJ SOLN: 0.2500 mg | Freq: Four times a day (QID) | INTRAMUSCULAR | Status: DC

## 2019-09-09 MED ORDER — SODIUM CHLORIDE 0.9% IV SOLUTION: Freq: Once | INTRAVENOUS | Status: DC

## 2019-09-09 NOTE — Progress Notes (Signed)
Subjective: Patient was transferred to MICU because of hypotension and ongoing tachycardia with A. fib and RVR. He is currently on IV phenylephrine and IV amiodarone. He has not had any further episodes of hematemesis since admission. As per his nurse at bedside he had one small black liquid stool today morning.  Objective: Vital signs in last 24 hours: Temp:  [97.8 F (36.6 C)-98.9 F (37.2 C)] 98.5 F (36.9 C) (08/16 0700) Pulse Rate:  [53-186] 66 (08/16 0800) Resp:  [13-29] 18 (08/16 0800) BP: (62-106)/(40-89) 81/55 (08/16 0800) SpO2:  [93 %-100 %] 99 % (08/16 0800) Weight:  [115.7 kg] 115.7 kg (08/15 1426) Weight change:  Last BM Date: 09/09/19  PE: Mild pallor, on oxygen via nasal cannula at 2 L/min GENERAL: Not in distress, able to speak in full sentences, alert and oriented x3 ABDOMEN: Nondistended, nontender, normoactive bowel sounds, soft abdomen EXTREMITIES: No deformity, no edema  Lab Results: Results for orders placed or performed during the hospital encounter of 09/07/19 (from the past 48 hour(s))  Lipase, blood     Status: None   Collection Time: 09/07/19  4:15 PM  Result Value Ref Range   Lipase 21 11 - 51 U/L    Comment: Performed at [facility], [address], [place], [place] [address]  Comprehensive metabolic panel     Status: Abnormal   Collection Time: 09/07/19  4:15 PM  Result Value Ref Range   Sodium 139 135 - 145 mmol/L   Potassium 6.1 (H) 3.5 - 5.1 mmol/L   Chloride 84 (L) 98 - 111 mmol/L   CO2 46 (H) 22 - 32 mmol/L   Glucose, Bld 103 (H) 70 - 99 mg/dL    Comment: Glucose reference range applies only to samples taken after fasting for at least 8 hours.   BUN 86 (H) 6 - 20 mg/dL   Creatinine, Ser 1.92 (H) 0.61 - 1.24 mg/dL   Calcium 8.6 (L) 8.9 - 10.3 mg/dL   Total Protein 5.3 (L) 6.5 - 8.1 g/dL   Albumin 2.6 (L) 3.5 - 5.0 g/dL   AST 23 15 - 41 U/L   ALT 19 0 - 44 U/L   Alkaline Phosphatase 77 38 - 126 U/L   Total Bilirubin 2.0 (H) 0.3  - 1.2 mg/dL   GFR calc non Af Amer 44 (L) >60 mL/min   GFR calc Af Amer 51 (L) >60 mL/min   Anion gap 9 5 - 15    Comment: Performed at [facility] [address] [place], [place] [address]  CBC     Status: Abnormal   Collection Time: 09/07/19  4:15 PM  Result Value Ref Range   WBC 11.8 (H) 4.0 - 10.5 K/uL   RBC 4.57 4.22 - 5.81 MIL/uL   Hemoglobin 12.5 (L) 13.0 - 17.0 g/dL   HCT 46.9 39 - 52 %   MCV 102.6 (H) 80.0 - 100.0 fL   MCH 27.4 26.0 - 34.0 pg   MCHC 26.7 (L) 30.0 - 36.0 g/dL   RDW 16.3 (H) 11.5 - 15.5 %   Platelets 171 150 - 400 K/uL   nRBC 0.0 0.0 - 0.2 %    Comment: Performed at [facility] [address], [place],  [address]  Urinalysis, Routine w reflex microscopic Urine, Clean Catch     Status: Abnormal   Collection Time: 09/07/19  9:19 PM  Result Value Ref Range   Color, Urine STRAW (A) YELLOW   APPearance  CLEAR CLEAR   Specific Gravity, Urine 1.009 1.005 - 1.030   pH 7.0 5.0 - 8.0   Glucose, UA NEGATIVE NEGATIVE mg/dL   Hgb urine dipstick NEGATIVE NEGATIVE   Bilirubin Urine NEGATIVE NEGATIVE   Ketones, ur NEGATIVE NEGATIVE mg/dL   Protein, ur NEGATIVE NEGATIVE mg/dL   Nitrite NEGATIVE NEGATIVE   Leukocytes,Ua NEGATIVE NEGATIVE    Comment: Performed at [facility] [address] [place], [place] [address]  POC occult blood, ED     Status: Abnormal   Collection Time: 09/07/19 10:24 PM  Result Value Ref Range   Fecal Occult Bld POSITIVE (A) NEGATIVE  SARS Coronavirus 2 by RT PCR (hospital order, performed in [facility] hospital lab) Nasopharyngeal Nasopharyngeal Swab     Status: None   Collection Time: 09/07/19 11:49 PM   Specimen: Nasopharyngeal Swab  Result Value Ref Range   SARS Coronavirus 2 NEGATIVE NEGATIVE    Comment: (NOTE) SARS-CoV-2 target nucleic acids are NOT DETECTED.  The SARS-CoV-2 RNA is generally detectable in upper and lower respiratory specimens during the acute phase of infection. The  lowest concentration of SARS-CoV-2 viral copies this assay can detect is 250 copies / mL. A negative result does not preclude SARS-CoV-2 infection and should not be used as the sole basis for treatment or other patient management decisions.  A negative result may occur with improper specimen collection / handling, submission of specimen other than nasopharyngeal swab, presence of viral mutation(s) within the areas targeted by this assay, and inadequate number of viral copies (<250 copies / mL). A negative result must be combined with clinical observations, patient history, and epidemiological information.  Fact Sheet for Patients:   StrictlyIdeas.no  Fact Sheet for Healthcare Providers: BankingDealers.co.za  This test is not yet approved or  cleared by the [place] FDA and has been authorized for detection and/or diagnosis of SARS-CoV-2 by FDA under an Emergency Use Authorization (EUA).  This EUA will remain in effect (meaning this test can be used) for the duration of the COVID-19 declaration under Section 564(b)(1) of the Act, 21 U.S.C. section 360bbb-3(b)(1), unless the authorization is terminated or revoked sooner.  Performed at [facility], [place] [address], [place], [place] [address]   Type and screen [place]     Status: None (Preliminary result)   Collection Time: 09/08/19  1:52 AM  Result Value Ref Range   ABO/RH(D) O POS    Antibody Screen NEG    Sample Expiration      09/11/2019,2359 Performed at [facility], [place] [address] [place], [place] [address]    Unit Number [id number]    Blood Component Type RED CELLS,LR    Unit division 00    Status of Unit ALLOCATED    Transfusion Status OK TO TRANSFUSE    Crossmatch Result Compatible   Protime-INR     Status: Abnormal   Collection Time: 09/08/19  4:34 AM  Result Value Ref Range   Prothrombin Time 15.7 (H) 11.4 - 15.2 seconds    INR 1.3 (H) 0.8 - 1.2    Comment: (NOTE) INR goal varies based on device and disease states. Performed at [facility], [place] [address] [place], [place] [address]   Potassium     Status: None   Collection Time: 09/08/19  4:34 AM  Result Value Ref Range   Potassium 4.8 3.5 - 5.1 mmol/L    Comment: Performed at [facility]  [address] [place], [place] [address]  Hemoglobin and hematocrit, blood     Status: Abnormal   Collection Time: 09/08/19  4:34 AM  Result Value Ref Range   Hemoglobin 11.2 (L) 13.0 - 17.0 g/dL   HCT 42.3 39 - 52 %    Comment: Performed at [facility] [address], [place], [place] [address]  ABO/Rh     Status: None   Collection Time: 09/08/19  4:35 AM  Result Value Ref Range   ABO/RH(D)      O POS Performed at [facility] [address] [place], [place] [address]   Glucose, random     Status: Abnormal   Collection Time: 09/08/19  4:35 AM  Result Value Ref Range   Glucose, Bld 174 (H) 70 - 99 mg/dL    Comment: Glucose reference range applies only to samples taken after fasting for at least 8 hours. Performed at [facility], [place] [address] [place], [place] [address]   CBG monitoring, ED     Status: None   Collection Time: 09/08/19  6:59 AM  Result Value Ref Range   Glucose-Capillary 84 70 - 99 mg/dL    Comment: Glucose reference range applies only to samples taken after fasting for at least 8 hours.  CBC     Status: Abnormal   Collection Time: 09/08/19 10:57 AM  Result Value Ref Range   WBC 15.5 (H) 4.0 - 10.5 K/uL   RBC 4.13 (L) 4.22 - 5.81 MIL/uL   Hemoglobin 11.4 (L) 13.0 - 17.0 g/dL   HCT 42.9 39 - 52 %   MCV 103.9 (H) 80.0 - 100.0 fL   MCH 27.6 26.0 - 34.0 pg   MCHC 26.6 (L) 30.0 - 36.0 g/dL   RDW 16.6 (H) 11.5 - 15.5 %   Platelets 172 150 - 400 K/uL   nRBC 0.0 0.0 - 0.2 %    Comment: Performed at [facility] [address], [place], [place] [address]  CBG monitoring, ED     Status: None    Collection Time: 09/08/19 12:52 PM  Result Value Ref Range   Glucose-Capillary 80 70 - 99 mg/dL    Comment: Glucose reference range applies only to samples taken after fasting for at least 8 hours.   Comment 1 Notify RN    Comment 2 Document in Chart   Magnesium     Status: Abnormal   Collection Time: 09/09/19  2:16 AM  Result Value Ref Range   Magnesium 1.6 (L) 1.7 - 2.4 mg/dL    Comment: Performed at [facility] [address], [place],  [address]  Comprehensive metabolic panel     Status: Abnormal   Collection Time: 09/09/19  2:16 AM  Result Value Ref Range   Sodium 147 (H) 135 - 145 mmol/L   Potassium 5.4 (H) 3.5 - 5.1 mmol/L   Chloride 88 (L) 98 - 111 mmol/L   CO2 >50 (H) 22 - 32 mmol/L   Glucose, Bld 102 (H) 70 - 99 mg/dL    Comment: Glucose reference range applies only to samples taken after fasting for at least 8 hours.   BUN 74 (H) 6 - 20 mg/dL   Creatinine, Ser 1.63 (H) 0.61 - 1.24 mg/dL   Calcium 8.8 (L) 8.9 - 10.3 mg/dL   Total Protein 4.7 (L) 6.5 - 8.1 g/dL   Albumin 2.4 (L) 3.5 - 5.0 g/dL   AST 22 15 - 41  U/L   ALT 12 0 - 44 U/L   Alkaline Phosphatase 60 38 - 126 U/L   Total Bilirubin 1.7 (H) 0.3 - 1.2 mg/dL   GFR calc non Af Amer 53 (L) >60 mL/min   GFR calc Af Amer >60 >60 mL/min   Anion gap NOT CALCULATED 5 - 15    Comment: Performed at [facility] [address] [place], [place] [address]  CBC with Differential/Platelet     Status: Abnormal   Collection Time: 09/09/19  2:16 AM  Result Value Ref Range   WBC 11.6 (H) 4.0 - 10.5 K/uL   RBC 3.67 (L) 4.22 - 5.81 MIL/uL   Hemoglobin 10.1 (L) 13.0 - 17.0 g/dL   HCT 38.0 (L) 39 - 52 %   MCV 103.5 (H) 80.0 - 100.0 fL   MCH 27.5 26.0 - 34.0 pg   MCHC 26.6 (L) 30.0 - 36.0 g/dL   RDW 16.4 (H) 11.5 - 15.5 %   Platelets 165 150 - 400 K/uL   nRBC 0.0 0.0 - 0.2 %   Neutrophils Relative % 79 %   Neutro Abs 9.1 (H) 1.7 - 7.7 K/uL   Lymphocytes Relative 10 %   Lymphs Abs 1.1 0.7 - 4.0 K/uL    Monocytes Relative 9 %   Monocytes Absolute 1.0 0 - 1 K/uL   Eosinophils Relative 2 %   Eosinophils Absolute 0.3 0 - 0 K/uL   Basophils Relative 0 %   Basophils Absolute 0.0 0 - 0 K/uL   Immature Granulocytes 0 %   Abs Immature Granulocytes 0.04 0.00 - 0.07 K/uL    Comment: Performed at [facility], [place] [address] [place], [place] [address]  Iron and TIBC     Status: Abnormal   Collection Time: 09/09/19  2:16 AM  Result Value Ref Range   Iron 38 (L) 45 - 182 ug/dL   TIBC 290 250 - 450 ug/dL   Saturation Ratios 13 (L) 17.9 - 39.5 %   UIBC 252 ug/dL    Comment: Performed at [facility], [place] [address] [place], [place] [address]  Ferritin     Status: Abnormal   Collection Time: 09/09/19  2:16 AM  Result Value Ref Range   Ferritin 22 (L) 24 - 336 ng/mL    Comment: Performed at [facility], [place] [address], [place], [place] [address]  Transferrin     Status: None   Collection Time: 09/09/19  2:16 AM  Result Value Ref Range   Transferrin 207 180 - 329 mg/dL    Comment: Performed at [facility] [address], [place], [place] [address]  Brain natriuretic peptide     Status: Abnormal   Collection Time: 09/09/19  2:19 AM  Result Value Ref Range   B Natriuretic Peptide 214.9 (H) 0.0 - 100.0 pg/mL    Comment: Performed at [facility] [address] [place], [place] [address]  MRSA PCR Screening     Status: None   Collection Time: 09/09/19  2:53 AM   Specimen: Nasopharyngeal  Result Value Ref Range   MRSA by PCR NEGATIVE NEGATIVE    Comment:        The GeneXpert MRSA Assay (FDA approved for NASAL specimens only), is one component of a comprehensive MRSA colonization surveillance program. It is not intended to diagnose MRSA infection nor to guide or monitor treatment for MRSA infections. Performed at [facility], [address] [place], [place] [address]   Prepare RBC (crossmatch)     Status: None   Collection Time: 09/09/19  4:56  AM  Result Value Ref Range   Order Confirmation      ORDER PROCESSED BY BLOOD BANK Performed at [facility], [place] [address], [place], [place] [address]     Studies/Results: DG Chest 2 View  Result Date: 09/07/2019 CLINICAL DATA:  Patient coughing up blood. EXAM: CHEST - 2 VIEW COMPARISON:  Chest radiograph October 09, 2018. FINDINGS: Marked cardiomegaly. Masslike structure right hilum most compatible with enlarged pulmonary artery. No large area of pulmonary consolidation. No pleural effusion or pneumothorax. Osseous structures unremarkable. IMPRESSION: 1. Cardiomegaly. 2. Enlarged pulmonary artery. Electronically Signed   By: Lovey Newcomer M.D.   On: 09/07/2019 16:56   DG CHEST PORT 1 VIEW  Result Date: 09/09/2019 CLINICAL DATA:  Shortness of breath. EXAM: PORTABLE CHEST 1 VIEW COMPARISON:  Radiograph 2 days ago.  Coronary CT 10/16/2018 FINDINGS: Stable cardiomegaly. Stable prominence of the central pulmonary arteries. Vascular congestion without pulmonary edema. Subsegmental atelectasis or scarring in the left mid lung. No confluent airspace disease. No pleural fluid or pneumothorax. Stable osseous structures. Degenerative change in the right shoulder without ossified intra-articular body. IMPRESSION: 1. Stable cardiomegaly. 2. Vascular congestion. Electronically Signed   By: Keith Rake M.D.   On: 09/09/2019 02:11    Medications: I have reviewed the patient's current medications.  Assessment: Coffee-ground emesis and melena Hemodynamically unstable with atypical atrial flutter and hypotension, on IV phenylephrine and IV amiodarone, status post IV digoxin 0.25 mg x 1  Multiple comorbidities-obstructive sleep apnea, chronic hypoxic respiratory failure on oxygen, hypertension, pulmonary hypertension with cor pulmonale, PFO with bidirectional shunting and presumed paradoxical emboli, status post percutaneous closure on 05/16/2019, history of atrial flutter on Eliquis at  home  Patient noted to have a baseline hemoglobin of 20.4 likely related to cor pulmonale, hemoglobin 12.5 on admission which has decreased to 10.1 Macrocytosis, MCV 103.5 Elevated BUN/creatinine ratio of 86/1.92 on admission, 74/1.63 today Mild hyperkalemia and hyponatremia Severe alkalosis, bicarb more than 50  Plan: Start clear liquid diet, keep patient n.p.o. post midnight in anticipation of endoscopy tomorrow. Hopefully hemodynamics will have improved by then, hopefully patient will be taken off IV phenylephrine soon. Last dose of Eliquis seems to be yesterday as per patient. Continue Protonix 40 mg every 12 hours. Has been started on Carafate suspension 4 times daily. Will give Reglan 10 mg IV x1 as patient complains of nausea, hopefully it will also help with better visualization of gastric cavity on endoscopy planned for tomorrow.     Ronnette Juniper, MD 09/09/2019, 9:34 AM

## 2019-09-09 NOTE — Progress Notes (Signed)
Patient arrived to [facility] around 237AM.  EKG completed, [facility] notified of Afib RVR, Cards came to see patient, rapid response, and IV team started Midline.  Patient was asymptomatic even with low blood pressures and high heart rate.  Patient was given Digoxin, 1L NS bolus, Amiodarone gtt and Neosynephrine gtt started.  Patient was placed on Bedpan and just passed gas with no BM. E-Link notified about patient only having one access.  Blood and Mag is to still be given and physician is aware.

## 2019-09-09 NOTE — TOC Initial Note (Signed)
Transition of Care (TOC) - Initial/Assessment Note    Patient Details  Name: Douglas [name]. MRN: [id number] Date of Birth: [birth year]  Transition of Care [facility]) CM/SW Contact:    Verdell Carmine, RN Phone Number: 09/09/2019, 9:55 AM  Clinical Narrative:                 34 Year old male with history of strokeHPTn on eliquis admitted with coffe ground emises. Anemia, Fe deficiency, AKI. In ICU due to decreased BP and Atrial fibrillation. Currently on Neo and Amiodarone, protonix, . Awaiting GI evaluation. Consulted due to needs post hospitalization. He does have Medicaid, he should have been assigned a PCP. Did provide him resource of community health and wellness in patient instruction. To ake appointment for poet hospitalization. He will also be following up with GI. His medications should only cost 3 dollars. With medicaid. Will follow for other discharge needs.   Expected Discharge Plan: Home/Self Care     Patient Goals and CMS Choice        Expected Discharge Plan and Services Expected Discharge Plan: Home/Self Care In-house Referral: PCP / [facility]     Living arrangements for the past 2 months: Apartment                                      Prior Living Arrangements/Services Living arrangements for the past 2 months: Apartment   Patient language and need for interpreter reviewed:: Yes        Need for Family Participation in Patient Care: Yes (Comment) Care giver support system in place?: Yes (comment)   Criminal Activity/Legal Involvement Pertinent to Current Situation/Hospitalization: No - Comment as needed  Activities of Daily Living Home Assistive Devices/Equipment: Oxygen ADL Screening (condition at time of admission) Patient's cognitive ability adequate to safely complete daily activities?: Yes Is the patient deaf or have difficulty hearing?: No Does the patient have difficulty seeing, even when wearing glasses/contacts?: No Does the  patient have difficulty concentrating, remembering, or making decisions?: No Patient able to express need for assistance with ADLs?: Yes Does the patient have difficulty dressing or bathing?: No Independently performs ADLs?: Yes (appropriate for developmental age) Does the patient have difficulty walking or climbing stairs?: No Weakness of Legs: None Weakness of Arms/Hands: None  Permission Sought/Granted Permission sought to share information with : Case Manager                Emotional Assessment Appearance:: Appears older than stated age     Orientation: : Oriented to Self, Oriented to Place, Oriented to  Time, Oriented to Situation Alcohol / Substance Use: Not Applicable Psych Involvement: No (comment)  Admission diagnosis:  Melena [K92.1] Acute GI bleeding [K92.2] Gastrointestinal hemorrhage, unspecified gastrointestinal hemorrhage type [K92.2] Patient Active Problem List   Diagnosis Date Noted  . Acute blood loss anemia 09/08/2019  . Biventricular congestive heart failure ([place]) 09/08/2019  . Chronic respiratory failure with hypoxia ([place]) 09/08/2019  . Pulmonary hypertension with chronic cor pulmonale ([place]) 09/08/2019  . Melena 09/08/2019  . PFO (patent foramen ovale) 05/16/2019  . CVA (cerebral vascular accident) ([place]) 10/14/2018  . Morbid obesity ([place]) 06/17/2015  . Essential hypertension 04/14/2015  . Acute hyperkalemia 04/14/2015  . OSA (obstructive sleep apnea)   . Difficult intravenous access   . Chronic diastolic CHF (congestive heart failure), NYHA class 3 ([place]) 09/13/2014  . Cor pulmonale, chronic ([place]) 03/19/2014  .  OSA treated with BiPAP    PCP:  Patient, No Pcp Per Pharmacy:   [facility], [place] - [address] [place] [address] Phone: [phone] Fax: [phone]     Social Determinants of Health (SDOH) Interventions    Readmission Risk Interventions No flowsheet data found.

## 2019-09-09 NOTE — Progress Notes (Signed)
RT set up Dream station BIPAP with 4L O2 bled into circuit. Patient tolerating well at this time. RT will monitor as needed.

## 2019-09-09 NOTE — Progress Notes (Signed)
Notified by CCMD of heart rhythm in A-fib and  heart rate 110-140s. BP soft Patient denies pain or SOB. Dr. Marlowe Sax notified.

## 2019-09-09 NOTE — Consult Note (Signed)
Cardiology Consult    Patient ID: Douglas [name]. MRN: [id number], DOB/AGE: 03-26-1985   Admit date: 09/07/2019 Date of Consult: 09/09/2019  Primary Physician: Patient, No Pcp Per Primary Cardiologist: No primary care provider on file. Requesting Provider: Shela Leff, MD  Patient Profile    Douglas [name]. is a 34 y.o. male with a history of OHS/OSA and chronic hypoxic respiratory failure (on O2 only, does not have BiPAP), HTN, HFpEF, PH with cor pulmonale, PFO with bidirectional shunting and presumed embolic stroke s/p percutaneous closure 05/16/19, and atrial flutter s/p DCCV 07/15/19, on Eliquis.   He was admitted yesterday for upper GI bleed and is being seen emergently for tachyarrhythmia in the setting of hemodynamic instability.   History of Present Illness    Reports he presented to the hospital with blood in the toilet when using the bathroom and vomiting up what looked like feces mixed with blood. His baseline hemoglobin is around 16-20 in the setting of chronic hypoxemia and on presentation was found to be 12.5, dropping further overnight to 10.1. His Eliquis is being held and he is on IV PPI. He was seen by GI who is holding off on endoscopy pending further hemodynamic stabilization.   I was called tonight as he has been more hypotensive requiring volume resuscitation and noted to be tachycardic with highly variable ventricular rates from 110 or so to 190. At these higher rates he became more hypotensive. On my evaluation he has difficulty saying if he could tell when his rate was high. He denies palpitations or chest pain. Dyspnea is not far off from baseline.   Past Medical History   Past Medical History:  Diagnosis Date  . Arthritis    "hands, feet" (10/31/2013)  . CHF (congestive heart failure) ([place])   . Chronic bronchitis ([place])   . High cholesterol   . Hypertension   . Obesity   . OSA on CPAP   . Pneumonia 09/2011  . Substance abuse ([place])  8/31/213   cociane "first time use"  . Tobacco abuse     Past Surgical History:  Procedure Laterality Date  . BUBBLE STUDY  10/15/2018   Procedure: BUBBLE STUDY;  Surgeon: Larey Dresser, MD;  Location: [place];  Service: Cardiovascular;;  . CARDIOVERSION N/A 07/15/2019   Procedure: CARDIOVERSION;  Surgeon: Larey Dresser, MD;  Location: [facility] ENDOSCOPY;  Service: Cardiovascular;  Laterality: N/A;  . CARDIOVERSION N/A 08/14/2019   Procedure: CARDIOVERSION;  Surgeon: Larey Dresser, MD;  Location: [facility] ENDOSCOPY;  Service: Cardiovascular;  Laterality: N/A;  . NO PAST SURGERIES    . PATENT FORAMEN OVALE(PFO) CLOSURE N/A 05/16/2019   Procedure: PATENT FORAMEN OVALE (PFO) CLOSURE;  Surgeon: Sherren Mocha, MD;  Location: [place] CV LAB;  Service: Cardiovascular;  Laterality: N/A;  . TEE WITHOUT CARDIOVERSION N/A 10/15/2018   Procedure: TRANSESOPHAGEAL ECHOCARDIOGRAM (TEE);  Surgeon: Larey Dresser, MD;  Location: [facility] ENDOSCOPY;  Service: Cardiovascular;  Laterality: N/A;  . TEE WITHOUT CARDIOVERSION N/A 07/15/2019   Procedure: TRANSESOPHAGEAL ECHOCARDIOGRAM (TEE);  Surgeon: Larey Dresser, MD;  Location: [facility] ENDOSCOPY;  Service: Cardiovascular;  Laterality: N/A;  . TUBES IN EARS       Allergies  Allergen Reactions  . Lisinopril Cough   Inpatient Medications    . atorvastatin  80 mg Oral q1800  . digoxin  0.25 mg Intravenous Once  . pantoprazole (PROTONIX) IV  40 mg Intravenous Q12H  . sucralfate  1 g Oral TID WC & HS  Family History    Family History  Problem Relation Age of Onset  . Heart disease Mother        Pacemaker  . Hypertension Father   . Cancer Paternal Grandfather   . Heart attack Neg Hx   . Stroke Neg Hx    He indicated that his mother is alive. He indicated that his father is alive. He indicated that his maternal grandmother is deceased. He indicated that his maternal grandfather is deceased. He indicated that his paternal grandmother is deceased. He  indicated that his paternal grandfather is deceased. He indicated that the status of his neg hx is unknown.   Social History    Social History   Socioeconomic History  . Marital status: Single    Spouse name: Not on file  . Number of children: 2  . Years of education: Not on file  . Highest education level: Not on file  Occupational History  . Occupation: N/A    Employer: NOT EMPLOYED  Tobacco Use  . Smoking status: Former Smoker    Packs/day: 1.50    Years: 3.00    Pack years: 4.50    Types: Cigarettes    Quit date: 10/31/2013    Years since quitting: 5.8  . Smokeless tobacco: Never Used  Substance and Sexual Activity  . Alcohol use: No    Alcohol/week: 1.0 standard drink    Types: 1 Cans of beer per week  . Drug use: No    Types: Cocaine    Comment: 10/31/2013 "might use cocaine once/month" - Has not used since 2015.   Marland Kitchen Sexual activity: Never  Other Topics Concern  . Not on file  Social History Narrative  . Not on file   Social Determinants of Health   Financial Resource Strain:   . Difficulty of Paying Living Expenses:   Food Insecurity:   . Worried About Charity fundraiser in the Last Year:   . Arboriculturist in the Last Year:   Transportation Needs:   . Film/video editor (Medical):   Marland Kitchen Lack of Transportation (Non-Medical):   Physical Activity:   . Days of Exercise per Week:   . Minutes of Exercise per Session:   Stress:   . Feeling of Stress :   Social Connections:   . Frequency of Communication with Friends and Family:   . Frequency of Social Gatherings with Friends and Family:   . Attends Religious Services:   . Active Member of Clubs or Organizations:   . Attends Archivist Meetings:   Marland Kitchen Marital Status:   Intimate Partner Violence:   . Fear of Current or Ex-Partner:   . Emotionally Abused:   Marland Kitchen Physically Abused:   . Sexually Abused:      Review of Systems    Limited due to patient condition  Physical Exam    Blood  pressure (!) 93/53, pulse (!) 104, temperature 98.7 F (37.1 C), temperature source Oral, resp. rate 14, height 5\' 4"  (1.626 m), weight 115.7 kg, SpO2 98 %.     Intake/Output Summary (Last 24 hours) at 09/09/2019 0433 Last data filed at 09/08/2019 2200 Gross per 24 hour  Intake 571.75 ml  Output 300 ml  Net 271.75 ml   Wt Readings from Last 3 Encounters:  09/08/19 115.7 kg  09/06/19 126.6 kg  08/15/19 (!) 119.3 kg   CONSTITUTIONAL: Awake and appears alert, but demonstrates some mental slowing, not in acute distress currently HEENT: conjunctiva normal, EOM  intact, pupils equal, no lid lag. NECK: JVP is elevated CARDIOVASCULAR: Irregular rhythm, no appreciable murmur. Radial pulses intact.  PULMONARY/CHEST WALL: no deformities, normal breath sounds bilaterally, normal work of breathing ABDOMINAL: soft, non-tender, non-distended. Liver is pulsatile. EXTREMITIES: no edema, warm and well-perfused SKIN: Dry and intact without apparent rashes or wounds. NEUROLOGIC: alert, responds to questions, no abnormal movements, cranial nerves grossly intact.   Labs    No results for input(s): CKTOTAL, CKMB, TROPONINIHS, RELINDX in the last 72 hours.  Lab Results  Component Value Date   WBC 11.6 (H) 09/09/2019   HGB 10.1 (L) 09/09/2019   HCT 38.0 (L) 09/09/2019   MCV 103.5 (H) 09/09/2019   PLT 165 09/09/2019    Recent Labs  Lab 09/09/19 0216  NA 147*  K 5.4*  CL 88*  CO2 >50*  BUN 74*  CREATININE 1.63*  CALCIUM 8.8*  PROT 4.7*  BILITOT 1.7*  ALKPHOS 60  ALT 12  AST 22  GLUCOSE 102*   Lab Results  Component Value Date   CHOL 115 02/14/2019   HDL 46 02/14/2019   LDLCALC 59 02/14/2019   TRIG 49 02/14/2019   Lab Results  Component Value Date   DDIMER <0.27 06/09/2014     Radiology Studies    DG Chest 2 View  Result Date: 09/07/2019 CLINICAL DATA:  Patient coughing up blood. EXAM: CHEST - 2 VIEW COMPARISON:  Chest radiograph October 09, 2018. FINDINGS: Marked  cardiomegaly. Masslike structure right hilum most compatible with enlarged pulmonary artery. No large area of pulmonary consolidation. No pleural effusion or pneumothorax. Osseous structures unremarkable. IMPRESSION: 1. Cardiomegaly. 2. Enlarged pulmonary artery. Electronically Signed   By: Lovey Newcomer M.D.   On: 09/07/2019 16:56   DG CHEST PORT 1 VIEW  Result Date: 09/09/2019 CLINICAL DATA:  Shortness of breath. EXAM: PORTABLE CHEST 1 VIEW COMPARISON:  Radiograph 2 days ago.  Coronary CT 10/16/2018 FINDINGS: Stable cardiomegaly. Stable prominence of the central pulmonary arteries. Vascular congestion without pulmonary edema. Subsegmental atelectasis or scarring in the left mid lung. No confluent airspace disease. No pleural fluid or pneumothorax. Stable osseous structures. Degenerative change in the right shoulder without ossified intra-articular body. IMPRESSION: 1. Stable cardiomegaly. 2. Vascular congestion. Electronically Signed   By: Keith Rake M.D.   On: 09/09/2019 02:11    ECG & Cardiac Imaging    ECG reveals likely atypical atrial flutter with variable conduction with occasional abberancy - personally reviewed.   Assessment & Plan    Atypical atrial flutter: predominantly with variable conduction, though was able to conduct 1:1 with ventricular rates of 190. This was associated with worsening hemodynamics, though at times he is still hypotensive even at more appropriate rates. His overall instability is much more likely primarily due to volume/blood loss in the setting of GI bleed. However, would not be expected to tolerate conducting 1:1 at rates of 190, particularly given his pulmonary hypertension and right ventricular failure. He is not able to tolerate anticoagulation at this time and in any case rhythm control would probably be unsuccessful given his atrial disease, chronic hypoxia, morbid obesity, etc. This is also not a CTI-dependent flutter and far less amenable to ablation -  additionally this appears to maybe be a left-sided focus and the PFO-closure device would certainly be problematic in obtaining transeptal access to the left atrium. For all of these reasons, I would recommend AV nodal blockade with the goal of preventing 1:1 conduction. His instability further limits options.  Patient was seen and  discussed with the hospitalist, Dr. Marlowe Sax. We will given digoxin 0.25mg  IV now and continue resuscitation. He will be transferred to the MICU and critical care prefers phenylephrine. Once the patient is more hemodynamically stable, start amiodarone 60 mg/hr for 6 hours then continue at 30 mg/hr. I would avoid the bolus given his advanced RV failure and instability. We can given additional digoxin load if needed, but will try amiodarone first given the AKI.    Signed, Marykay Lex, MD 09/09/2019, 4:33 AM  For questions or updates, please contact   Please consult [email or web address] for contact info under Cardiology/STEMI.

## 2019-09-09 NOTE — Significant Event (Signed)
Rapid Response Event Note   Reason for Call : Afib with RVR and hypotension  Initial Focused Assessment:  Notified by nursing staff of pt with Afib with RVR and Dr. Marlowe Sax at bedside. Upon arrival, Douglas [name] is alert, oriented x 4, denies CP and SOB, however gets SOB with little activity. Skin is warm and dry with cap refil > 3 secs.  HR 120-130 afib, BP 72/54 (60), RR 18 with sats 98% on 2L [place].  Cardiology consulted for rate control. No patent IV so several unsuccessful attempts made for PIV. VAS consulted for PIV and now at bedside. Midline catheter was placed. Digoxin, Amiodarone and IVF bolus infusing. PCCM consulted and Douglas [name] and Douglas Baas Gleason PA at bedside. Awaiting ICU bed assignment.      Interventions:  -Stat EKG- -Digoxin, Amio, IVF and Neosynephrine gtt  -Midline catheter per VAS team -Tx to ICU    MD Notified: per primary RN ([place], Cards, PCCM) Call Time:  0330 Arrival Time: 0335 End Time: 0400  Madelynn Done, RN

## 2019-09-09 NOTE — Progress Notes (Signed)
Peripherally Inserted Central Catheter Placement  The IV Nurse has discussed with the patient and/or persons authorized to consent for the patient, the purpose of this procedure and the potential benefits and risks involved with this procedure.  The benefits include less needle sticks, lab draws from the catheter, and the patient may be discharged home with the catheter. Risks include, but not limited to, infection, bleeding, blood clot (thrombus formation), and puncture of an artery; nerve damage and irregular heartbeat and possibility to perform a PICC exchange if needed/ordered by physician.  Alternatives to this procedure were also discussed.  Bard Power PICC patient education guide, fact sheet on infection prevention and patient information card has been provided to patient /or left at bedside.    PICC Placement Documentation  PICC Triple Lumen 09/09/19 PICC Right Brachial 43 cm 0 cm (Active)  Indication for Insertion or Continuance of Line Vasoactive infusions 09/09/19 2044  Exposed Catheter (cm) 0 cm 09/09/19 2044  Site Assessment Clean;Dry;Intact 09/09/19 2044  Lumen #1 Status Flushed;Saline locked;Blood return noted 09/09/19 2044  Lumen #2 Status Flushed;Saline locked;Blood return noted 09/09/19 2044  Lumen #3 Status Flushed;Saline locked;Blood return noted 09/09/19 2044  Dressing Type Transparent 09/09/19 2044  Dressing Status Clean;Dry;Intact;Antimicrobial disc in place 09/09/19 2044  Safety Lock Not Applicable 97/67/34 1937  Dressing Intervention New dressing 09/09/19 2044  Dressing Change Due 09/16/19 09/09/19 2044       Gordan Payment 09/09/2019, 8:46 PM

## 2019-09-09 NOTE — Consult Note (Signed)
NAME:  Douglas [name]., MRN:  [id number], DOB:  11/08/1985, LOS: 1 ADMISSION DATE:  09/07/2019, CONSULTATION DATE:  09/09/19 REFERRING MD:  Douglas [name], CHIEF COMPLAINT:  hypotension   Brief History   34 y/o M with PMH of of HFpEF with PFO and stroke in 2020 on Eliquis, OSA, HTN, atrial flutter who presented with coffee ground emesis on 8/15.  Douglas [name] was started on PPI and did not require transfusion, Douglas [name] had an episode of SVT in the early morning hours of 8/16 with hypotension so PCCM consulted.   History of present illness   Douglas [name] is a 34 y.o. M with PMH of HFpEF with PFO and stroke in 2020 on Eliquis who presented with coffee ground emesis on 8/15 and Hgb drop from baseline of 16 to 12. Douglas [name] was started on PPI and admitted.  GI felt Douglas [name] was too unstable for endoscopy and were to re-evaluate.   Overnight, pt had an episode of SVT while walking to the bathroom with heart racing and dyspnea.  This converted back to sinus rhythm, however BP downtrending.  No further hematemesis or melena, Repeat Hgb 12.  Douglas [name] was given 500cc IVF bolus and PCCM consulted  Past Medical History   has a past medical history of Arthritis, CHF (congestive heart failure) ([place]), Chronic bronchitis ([place]), High cholesterol, Hypertension, Obesity, OSA on CPAP, Pneumonia (09/2011), Substance abuse ([place]) (8/31/213), and Tobacco abuse.  Significant Hospital Events   8/15 admit to hospitalists 8/16 PCCM consult  Consults:  PCCM  Procedures:    Significant Diagnostic Tests:  8/16 CXR stable cardiomegaly, vascular congestion  Micro Data:  8/14 Sars-CoV-2>>negative  Antimicrobials:    Interim history/subjective:  BP improving after IVF  Objective   Blood pressure (!) 93/53, pulse (!) 104, temperature 98.7 F (37.1 C), temperature source Oral, resp. rate 14, height 5\' 4"  (1.626 m), weight 115.7 kg, SpO2 98 %.        Intake/Output Summary (Last 24 hours) at 09/09/2019 0401 Last data filed at 09/08/2019  2200 Gross per 24 hour  Intake 571.75 ml  Output 300 ml  Net 271.75 ml   Filed Weights   09/08/19 1426  Weight: 115.7 kg   General:  Awake and alert, no distress HEENT: MM pink/moist Neuro: alert and oriented, moving all extremities CV: s1s2 tachycardic HR 120; irregular, no m/r/g PULM:  Slightly decreased in the bases  GI: soft, bsx4 active  Extremities: warm/dry, no edema  Skin: no rashes or lesions  Resolved Hospital Problem list     Assessment & Plan:    Episode of SVT and with hypotension and atrial fibrillation in the setting of chronic RV failure  and admission for GIB Occurred while up walking to the bathroom, now in atrial fibrillation.flutter.  Douglas [name] has a history of Aflutter with two past cardioversions on Eliquis -Received 1.5L IVF and seen by cardiology, starting digoxin and neosynephrine, once patient is stable start amiodarone -holding eliquis in the setting GIB -transfer to ICU -Mag 1.6 will replace -last echo 6/21 with EF 55-60%   Upper GIB -continue protonix, repeat GI eval pending -hold Xarento Hgb downtrending to 10.1, hold transfusion for now to avoid volume overload in, transfuse if needed  Chronic respiratory failure, OSA -stable currently    Best practice:  Diet: as tolerated Pain/Anxiety/Delirium protocol (if indicated): n/a VAP protocol (if indicated): n/a DVT prophylaxis: SCD's GI prophylaxis: protonix Glucose control: SSI Mobility: bed rest Code Status: full code Family Communication: pt communcating Disposition: ICU  Labs  CBC: Recent Labs  Lab 09/07/19 1615 09/08/19 0434 09/08/19 1057 09/09/19 0216  WBC 11.8*  --  15.5* 11.6*  NEUTROABS  --   --   --  9.1*  HGB 12.5* 11.2* 11.4* 10.1*  HCT 46.9 42.3 42.9 38.0*  MCV 102.6*  --  103.9* 103.5*  PLT 171  --  172 662    Basic Metabolic Panel: Recent Labs  Lab 09/07/19 1615 09/08/19 0434 09/08/19 0435 09/09/19 0216  NA 139  --   --  147*  K 6.1* 4.8  --  5.4*  CL  84*  --   --  88*  CO2 46*  --   --  >50*  GLUCOSE 103*  --  174* 102*  BUN 86*  --   --  74*  CREATININE 1.92*  --   --  1.63*  CALCIUM 8.6*  --   --  8.8*  MG  --   --   --  1.6*   GFR: Estimated Creatinine Clearance: 72.5 mL/min (A) (by C-G formula based on SCr of 1.63 mg/dL (H)). Recent Labs  Lab 09/07/19 1615 09/08/19 1057 09/09/19 0216  WBC 11.8* 15.5* 11.6*    Liver Function Tests: Recent Labs  Lab 09/07/19 1615 09/09/19 0216  AST 23 22  ALT 19 12  ALKPHOS 77 60  BILITOT 2.0* 1.7*  PROT 5.3* 4.7*  ALBUMIN 2.6* 2.4*   Recent Labs  Lab 09/07/19 1615  LIPASE 21   No results for input(s): AMMONIA in the last 168 hours.  ABG    Component Value Date/Time   PHART 7.376 04/28/2015 0500   PCO2ART 55.5 (H) 04/28/2015 0500   PO2ART 65.6 (L) 04/28/2015 0500   HCO3 31.8 (H) 04/28/2015 0500   TCO2 45 (H) 08/14/2019 1027   O2SAT 90.0 04/28/2015 0500     Coagulation Profile: Recent Labs  Lab 09/08/19 0434  INR 1.3*    Cardiac Enzymes: No results for input(s): CKTOTAL, CKMB, CKMBINDEX, TROPONINI in the last 168 hours.  HbA1C: Hgb A1c MFr Bld  Date/Time Value Ref Range Status  06/20/2019 03:22 PM 5.7 (H) 4.8 - 5.6 % Final    Comment:    (NOTE) Pre diabetes:          5.7%-6.4% Diabetes:              >6.4% Glycemic control for   <7.0% adults with diabetes   10/14/2018 06:49 AM 5.6 4.8 - 5.6 % Final    Comment:    (NOTE) Pre diabetes:          5.7%-6.4% Diabetes:              >6.4% Glycemic control for   <7.0% adults with diabetes     CBG: Recent Labs  Lab 09/08/19 0659 09/08/19 1252  GLUCAP 84 80    Review of Systems:     Past Medical History  Douglas [name],  has a past medical history of Arthritis, CHF (congestive heart failure) ([place]), Chronic bronchitis ([place]), High cholesterol, Hypertension, Obesity, OSA on CPAP, Pneumonia (09/2011), Substance abuse ([place]) (8/31/213), and Tobacco abuse.   Surgical History    Past Surgical History:  Procedure  Laterality Date  . BUBBLE STUDY  10/15/2018   Procedure: BUBBLE STUDY;  Surgeon: Larey Dresser, MD;  Location: [place];  Service: Cardiovascular;;  . CARDIOVERSION N/A 07/15/2019   Procedure: CARDIOVERSION;  Surgeon: Larey Dresser, MD;  Location: [facility] ENDOSCOPY;  Service: Cardiovascular;  Laterality: N/A;  . CARDIOVERSION N/A 08/14/2019  Procedure: CARDIOVERSION;  Surgeon: Larey Dresser, MD;  Location: [facility] ENDOSCOPY;  Service: Cardiovascular;  Laterality: N/A;  . NO PAST SURGERIES    . PATENT FORAMEN OVALE(PFO) CLOSURE N/A 05/16/2019   Procedure: PATENT FORAMEN OVALE (PFO) CLOSURE;  Surgeon: Sherren Mocha, MD;  Location: [place] CV LAB;  Service: Cardiovascular;  Laterality: N/A;  . TEE WITHOUT CARDIOVERSION N/A 10/15/2018   Procedure: TRANSESOPHAGEAL ECHOCARDIOGRAM (TEE);  Surgeon: Larey Dresser, MD;  Location: [facility] ENDOSCOPY;  Service: Cardiovascular;  Laterality: N/A;  . TEE WITHOUT CARDIOVERSION N/A 07/15/2019   Procedure: TRANSESOPHAGEAL ECHOCARDIOGRAM (TEE);  Surgeon: Larey Dresser, MD;  Location: [facility] ENDOSCOPY;  Service: Cardiovascular;  Laterality: N/A;  . TUBES IN EARS       Social History   reports that Douglas [name] quit smoking about 5 years ago. His smoking use included cigarettes. Douglas [name] has a 4.50 pack-year smoking history. Douglas [name] has never used smokeless tobacco. Douglas [name] reports that Douglas [name] does not drink alcohol and does not use drugs.   Family History   His family history includes Cancer in his paternal grandfather; Heart disease in his mother; Hypertension in his father. There is no history of Heart attack or Stroke.   Allergies Allergies  Allergen Reactions  . Lisinopril Cough     Home Medications  Prior to Admission medications   Medication Sig Start Date End Date Taking? Authorizing Provider  albuterol (PROVENTIL HFA) 108 (90 Base) MCG/ACT inhaler Inhale 1-2 puffs into the lungs every 6 (six) hours as needed for wheezing or shortness of breath. 05/23/19  Yes Larey Dresser,  MD  atorvastatin (LIPITOR) 80 MG tablet Take 1 tablet (80 mg total) by mouth daily at 6 PM. 10/17/18  Yes Earlene Plater, MD  carvedilol (COREG) 25 MG tablet Take 1 tablet (25 mg total) by mouth 2 (two) times daily with a meal. 08/08/19  Yes Clegg, [name] D, NP  ELIQUIS 5 MG TABS tablet TAKE 1 TABLET (5 MG TOTAL) BY MOUTH 2 (TWO) TIMES DAILY. Patient taking differently: Take 5 mg by mouth 2 (two) times daily.  08/26/19  Yes Larey Dresser, MD  hydrALAZINE (APRESOLINE) 25 MG tablet TAKE 1 TABLET (25 MG TOTAL) BY MOUTH 3 (THREE) TIMES DAILY. 07/19/19  Yes Larey Dresser, MD  OXYGEN Inhale 2 L/min into the lungs continuous.    Yes [provider]  torsemide (DEMADEX) 20 MG tablet Take 4 tablets (80 mg total) by mouth in the morning AND 3 tablets (60 mg total) every evening. 08/14/19  Yes Larey Dresser, MD  sildenafil (VIAGRA) 50 MG tablet Take 1 tablet (50 mg total) by mouth daily as needed for erectile dysfunction. Patient not taking: Reported on 08/15/2019 04/12/19   Larey Dresser, MD     Critical care time:  77       CRITICAL CARE Performed by: Otilio Carpen Raciel Caffrey   Total critical care time: 45 minutes  Critical care time was exclusive of separately billable procedures and treating other patients.  Critical care was necessary to treat or prevent imminent or life-threatening deterioration.  Critical care was time spent personally by me on the following activities: development of treatment plan with patient and/or surrogate as well as nursing, discussions with consultants, evaluation of patient's response to treatment, examination of patient, obtaining history from patient or surrogate, ordering and performing treatments and interventions, ordering and review of laboratory studies, ordering and review of radiographic studies, pulse oximetry and re-evaluation of patient's condition.   Otilio Carpen Shalimar Mcclain, PA-C

## 2019-09-09 NOTE — H&P (View-Only) (Signed)
Subjective: Patient was transferred to MICU because of hypotension and ongoing tachycardia with A. fib and RVR. He is currently on IV phenylephrine and IV amiodarone. He has not had any further episodes of hematemesis since admission. As per his nurse at bedside he had one small black liquid stool today morning.  Objective: Vital signs in last 24 hours: Temp:  [97.8 F (36.6 C)-98.9 F (37.2 C)] 98.5 F (36.9 C) (08/16 0700) Pulse Rate:  [53-186] 66 (08/16 0800) Resp:  [13-29] 18 (08/16 0800) BP: (62-106)/(40-89) 81/55 (08/16 0800) SpO2:  [93 %-100 %] 99 % (08/16 0800) Weight:  [115.7 kg] 115.7 kg (08/15 1426) Weight change:  Last BM Date: 09/09/19  PE: Mild pallor, on oxygen via nasal cannula at 2 L/min GENERAL: Not in distress, able to speak in full sentences, alert and oriented x3 ABDOMEN: Nondistended, nontender, normoactive bowel sounds, soft abdomen EXTREMITIES: No deformity, no edema  Lab Results: Results for orders placed or performed during the hospital encounter of 09/07/19 (from the past 48 hour(s))  Lipase, blood     Status: None   Collection Time: 09/07/19  4:15 PM  Result Value Ref Range   Lipase 21 11 - 51 U/L    Comment: Performed at [facility], [address] [place], [place] [address]  Comprehensive metabolic panel     Status: Abnormal   Collection Time: 09/07/19  4:15 PM  Result Value Ref Range   Sodium 139 135 - 145 mmol/L   Potassium 6.1 (H) 3.5 - 5.1 mmol/L   Chloride 84 (L) 98 - 111 mmol/L   CO2 46 (H) 22 - 32 mmol/L   Glucose, Bld 103 (H) 70 - 99 mg/dL    Comment: Glucose reference range applies only to samples taken after fasting for at least 8 hours.   BUN 86 (H) 6 - 20 mg/dL   Creatinine, Ser 1.92 (H) 0.61 - 1.24 mg/dL   Calcium 8.6 (L) 8.9 - 10.3 mg/dL   Total Protein 5.3 (L) 6.5 - 8.1 g/dL   Albumin 2.6 (L) 3.5 - 5.0 g/dL   AST 23 15 - 41 U/L   ALT 19 0 - 44 U/L   Alkaline Phosphatase 77 38 - 126 U/L   Total Bilirubin 2.0 (H) 0.3  - 1.2 mg/dL   GFR calc non Af Amer 44 (L) >60 mL/min   GFR calc Af Amer 51 (L) >60 mL/min   Anion gap 9 5 - 15    Comment: Performed at [facility] [address], [place], [place] [address]  CBC     Status: Abnormal   Collection Time: 09/07/19  4:15 PM  Result Value Ref Range   WBC 11.8 (H) 4.0 - 10.5 K/uL   RBC 4.57 4.22 - 5.81 MIL/uL   Hemoglobin 12.5 (L) 13.0 - 17.0 g/dL   HCT 46.9 39 - 52 %   MCV 102.6 (H) 80.0 - 100.0 fL   MCH 27.4 26.0 - 34.0 pg   MCHC 26.7 (L) 30.0 - 36.0 g/dL   RDW 16.3 (H) 11.5 - 15.5 %   Platelets 171 150 - 400 K/uL   nRBC 0.0 0.0 - 0.2 %    Comment: Performed at [facility] [address], [place], [place] [address]  Urinalysis, Routine w reflex microscopic Urine, Clean Catch     Status: Abnormal   Collection Time: 09/07/19  9:19 PM  Result Value Ref Range   Color, Urine STRAW (A) YELLOW   APPearance  CLEAR CLEAR   Specific Gravity, Urine 1.009 1.005 - 1.030   pH 7.0 5.0 - 8.0   Glucose, UA NEGATIVE NEGATIVE mg/dL   Hgb urine dipstick NEGATIVE NEGATIVE   Bilirubin Urine NEGATIVE NEGATIVE   Ketones, ur NEGATIVE NEGATIVE mg/dL   Protein, ur NEGATIVE NEGATIVE mg/dL   Nitrite NEGATIVE NEGATIVE   Leukocytes,Ua NEGATIVE NEGATIVE    Comment: Performed at [facility] [address] [place], [place] [address]  POC occult blood, ED     Status: Abnormal   Collection Time: 09/07/19 10:24 PM  Result Value Ref Range   Fecal Occult Bld POSITIVE (A) NEGATIVE  SARS Coronavirus 2 by RT PCR (hospital order, performed in [facility] hospital lab) Nasopharyngeal Nasopharyngeal Swab     Status: None   Collection Time: 09/07/19 11:49 PM   Specimen: Nasopharyngeal Swab  Result Value Ref Range   SARS Coronavirus 2 NEGATIVE NEGATIVE    Comment: (NOTE) SARS-CoV-2 target nucleic acids are NOT DETECTED.  The SARS-CoV-2 RNA is generally detectable in upper and lower respiratory specimens during the acute phase of infection. The  lowest concentration of SARS-CoV-2 viral copies this assay can detect is 250 copies / mL. A negative result does not preclude SARS-CoV-2 infection and should not be used as the sole basis for treatment or other patient management decisions.  A negative result may occur with improper specimen collection / handling, submission of specimen other than nasopharyngeal swab, presence of viral mutation(s) within the areas targeted by this assay, and inadequate number of viral copies (<250 copies / mL). A negative result must be combined with clinical observations, patient history, and epidemiological information.  Fact Sheet for Patients:   StrictlyIdeas.no  Fact Sheet for Healthcare Providers: BankingDealers.co.za  This test is not yet approved or  cleared by the [place] FDA and has been authorized for detection and/or diagnosis of SARS-CoV-2 by FDA under an Emergency Use Authorization (EUA).  This EUA will remain in effect (meaning this test can be used) for the duration of the COVID-19 declaration under Section 564(b)(1) of the Act, 21 U.S.C. section 360bbb-3(b)(1), unless the authorization is terminated or revoked sooner.  Performed at [facility], [place] [address] [place], [place] [address]   Type and screen [place]     Status: None (Preliminary result)   Collection Time: 09/08/19  1:52 AM  Result Value Ref Range   ABO/RH(D) O POS    Antibody Screen NEG    Sample Expiration      09/11/2019,2359 Performed at [facility], [place] [address], [place], [place] [address]    Unit Number [id number]    Blood Component Type RED CELLS,LR    Unit division 00    Status of Unit ALLOCATED    Transfusion Status OK TO TRANSFUSE    Crossmatch Result Compatible   Protime-INR     Status: Abnormal   Collection Time: 09/08/19  4:34 AM  Result Value Ref Range   Prothrombin Time 15.7 (H) 11.4 - 15.2 seconds    INR 1.3 (H) 0.8 - 1.2    Comment: (NOTE) INR goal varies based on device and disease states. Performed at [facility], [place] [address], [place],  [address]   Potassium     Status: None   Collection Time: 09/08/19  4:34 AM  Result Value Ref Range   Potassium 4.8 3.5 - 5.1 mmol/L    Comment: Performed at [facility]  [address] [place], [place] [address]  Hemoglobin and hematocrit, blood     Status: Abnormal   Collection Time: 09/08/19  4:34 AM  Result Value Ref Range   Hemoglobin 11.2 (L) 13.0 - 17.0 g/dL   HCT 42.3 39 - 52 %    Comment: Performed at [facility] [address] [place], [place] [address]  ABO/Rh     Status: None   Collection Time: 09/08/19  4:35 AM  Result Value Ref Range   ABO/RH(D)      O POS Performed at [facility] [address], [place], [place] [address]   Glucose, random     Status: Abnormal   Collection Time: 09/08/19  4:35 AM  Result Value Ref Range   Glucose, Bld 174 (H) 70 - 99 mg/dL    Comment: Glucose reference range applies only to samples taken after fasting for at least 8 hours. Performed at [facility], [place] [address] [place], [place] [address]   CBG monitoring, ED     Status: None   Collection Time: 09/08/19  6:59 AM  Result Value Ref Range   Glucose-Capillary 84 70 - 99 mg/dL    Comment: Glucose reference range applies only to samples taken after fasting for at least 8 hours.  CBC     Status: Abnormal   Collection Time: 09/08/19 10:57 AM  Result Value Ref Range   WBC 15.5 (H) 4.0 - 10.5 K/uL   RBC 4.13 (L) 4.22 - 5.81 MIL/uL   Hemoglobin 11.4 (L) 13.0 - 17.0 g/dL   HCT 42.9 39 - 52 %   MCV 103.9 (H) 80.0 - 100.0 fL   MCH 27.6 26.0 - 34.0 pg   MCHC 26.6 (L) 30.0 - 36.0 g/dL   RDW 16.6 (H) 11.5 - 15.5 %   Platelets 172 150 - 400 K/uL   nRBC 0.0 0.0 - 0.2 %    Comment: Performed at [facility] [address] [place], [place] [address]  CBG monitoring, ED     Status: None    Collection Time: 09/08/19 12:52 PM  Result Value Ref Range   Glucose-Capillary 80 70 - 99 mg/dL    Comment: Glucose reference range applies only to samples taken after fasting for at least 8 hours.   Comment 1 Notify RN    Comment 2 Document in Chart   Magnesium     Status: Abnormal   Collection Time: 09/09/19  2:16 AM  Result Value Ref Range   Magnesium 1.6 (L) 1.7 - 2.4 mg/dL    Comment: Performed at [facility] [address] [place], [place] [address]  Comprehensive metabolic panel     Status: Abnormal   Collection Time: 09/09/19  2:16 AM  Result Value Ref Range   Sodium 147 (H) 135 - 145 mmol/L   Potassium 5.4 (H) 3.5 - 5.1 mmol/L   Chloride 88 (L) 98 - 111 mmol/L   CO2 >50 (H) 22 - 32 mmol/L   Glucose, Bld 102 (H) 70 - 99 mg/dL    Comment: Glucose reference range applies only to samples taken after fasting for at least 8 hours.   BUN 74 (H) 6 - 20 mg/dL   Creatinine, Ser 1.63 (H) 0.61 - 1.24 mg/dL   Calcium 8.8 (L) 8.9 - 10.3 mg/dL   Total Protein 4.7 (L) 6.5 - 8.1 g/dL   Albumin 2.4 (L) 3.5 - 5.0 g/dL   AST 22 15 - 41  U/L   ALT 12 0 - 44 U/L   Alkaline Phosphatase 60 38 - 126 U/L   Total Bilirubin 1.7 (H) 0.3 - 1.2 mg/dL   GFR calc non Af Amer 53 (L) >60 mL/min   GFR calc Af Amer >60 >60 mL/min   Anion gap NOT CALCULATED 5 - 15    Comment: Performed at [facility] [address], [place], [place] [address]  CBC with Differential/Platelet     Status: Abnormal   Collection Time: 09/09/19  2:16 AM  Result Value Ref Range   WBC 11.6 (H) 4.0 - 10.5 K/uL   RBC 3.67 (L) 4.22 - 5.81 MIL/uL   Hemoglobin 10.1 (L) 13.0 - 17.0 g/dL   HCT 38.0 (L) 39 - 52 %   MCV 103.5 (H) 80.0 - 100.0 fL   MCH 27.5 26.0 - 34.0 pg   MCHC 26.6 (L) 30.0 - 36.0 g/dL   RDW 16.4 (H) 11.5 - 15.5 %   Platelets 165 150 - 400 K/uL   nRBC 0.0 0.0 - 0.2 %   Neutrophils Relative % 79 %   Neutro Abs 9.1 (H) 1.7 - 7.7 K/uL   Lymphocytes Relative 10 %   Lymphs Abs 1.1 0.7 - 4.0 K/uL    Monocytes Relative 9 %   Monocytes Absolute 1.0 0 - 1 K/uL   Eosinophils Relative 2 %   Eosinophils Absolute 0.3 0 - 0 K/uL   Basophils Relative 0 %   Basophils Absolute 0.0 0 - 0 K/uL   Immature Granulocytes 0 %   Abs Immature Granulocytes 0.04 0.00 - 0.07 K/uL    Comment: Performed at [facility], [place] [address], [place], [place] [address]  Iron and TIBC     Status: Abnormal   Collection Time: 09/09/19  2:16 AM  Result Value Ref Range   Iron 38 (L) 45 - 182 ug/dL   TIBC 290 250 - 450 ug/dL   Saturation Ratios 13 (L) 17.9 - 39.5 %   UIBC 252 ug/dL    Comment: Performed at [facility], [place] [address] [place], [place] [address]  Ferritin     Status: Abnormal   Collection Time: 09/09/19  2:16 AM  Result Value Ref Range   Ferritin 22 (L) 24 - 336 ng/mL    Comment: Performed at [facility], [place] [address] [place], [place] [address]  Transferrin     Status: None   Collection Time: 09/09/19  2:16 AM  Result Value Ref Range   Transferrin 207 180 - 329 mg/dL    Comment: Performed at [facility] [address] [place], [place] [address]  Brain natriuretic peptide     Status: Abnormal   Collection Time: 09/09/19  2:19 AM  Result Value Ref Range   B Natriuretic Peptide 214.9 (H) 0.0 - 100.0 pg/mL    Comment: Performed at [facility] [address], [place], [place] [address]  MRSA PCR Screening     Status: None   Collection Time: 09/09/19  2:53 AM   Specimen: Nasopharyngeal  Result Value Ref Range   MRSA by PCR NEGATIVE NEGATIVE    Comment:        The GeneXpert MRSA Assay (FDA approved for NASAL specimens only), is one component of a comprehensive MRSA colonization surveillance program. It is not intended to diagnose MRSA infection nor to guide or monitor treatment for MRSA infections. Performed at [facility], [address] [place], [place] [address]   Prepare RBC (crossmatch)     Status: None   Collection Time: 09/09/19  4:56  AM  Result Value Ref Range   Order Confirmation      ORDER PROCESSED BY BLOOD BANK Performed at [facility], [place] [address] [place], [place] [address]     Studies/Results: DG Chest 2 View  Result Date: 09/07/2019 CLINICAL DATA:  Patient coughing up blood. EXAM: CHEST - 2 VIEW COMPARISON:  Chest radiograph October 09, 2018. FINDINGS: Marked cardiomegaly. Masslike structure right hilum most compatible with enlarged pulmonary artery. No large area of pulmonary consolidation. No pleural effusion or pneumothorax. Osseous structures unremarkable. IMPRESSION: 1. Cardiomegaly. 2. Enlarged pulmonary artery. Electronically Signed   By: Lovey Newcomer M.D.   On: 09/07/2019 16:56   DG CHEST PORT 1 VIEW  Result Date: 09/09/2019 CLINICAL DATA:  Shortness of breath. EXAM: PORTABLE CHEST 1 VIEW COMPARISON:  Radiograph 2 days ago.  Coronary CT 10/16/2018 FINDINGS: Stable cardiomegaly. Stable prominence of the central pulmonary arteries. Vascular congestion without pulmonary edema. Subsegmental atelectasis or scarring in the left mid lung. No confluent airspace disease. No pleural fluid or pneumothorax. Stable osseous structures. Degenerative change in the right shoulder without ossified intra-articular body. IMPRESSION: 1. Stable cardiomegaly. 2. Vascular congestion. Electronically Signed   By: Keith Rake M.D.   On: 09/09/2019 02:11    Medications: I have reviewed the patient's current medications.  Assessment: Coffee-ground emesis and melena Hemodynamically unstable with atypical atrial flutter and hypotension, on IV phenylephrine and IV amiodarone, status post IV digoxin 0.25 mg x 1  Multiple comorbidities-obstructive sleep apnea, chronic hypoxic respiratory failure on oxygen, hypertension, pulmonary hypertension with cor pulmonale, PFO with bidirectional shunting and presumed paradoxical emboli, status post percutaneous closure on 05/16/2019, history of atrial flutter on Eliquis at  home  Patient noted to have a baseline hemoglobin of 20.4 likely related to cor pulmonale, hemoglobin 12.5 on admission which has decreased to 10.1 Macrocytosis, MCV 103.5 Elevated BUN/creatinine ratio of 86/1.92 on admission, 74/1.63 today Mild hyperkalemia and hyponatremia Severe alkalosis, bicarb more than 50  Plan: Start clear liquid diet, keep patient n.p.o. post midnight in anticipation of endoscopy tomorrow. Hopefully hemodynamics will have improved by then, hopefully patient will be taken off IV phenylephrine soon. Last dose of Eliquis seems to be yesterday as per patient. Continue Protonix 40 mg every 12 hours. Has been started on Carafate suspension 4 times daily. Will give Reglan 10 mg IV x1 as patient complains of nausea, hopefully it will also help with better visualization of gastric cavity on endoscopy planned for tomorrow.     Ronnette Juniper, MD 09/09/2019, 9:34 AM

## 2019-09-09 NOTE — Progress Notes (Addendum)
Mr Korte is a 34 y.o. with OHS/OSA, morbid obesity, HTN,  A Fib, CVA, PFO closure 02/6376, diastolic CHF with prominent RV failure.   Followed by HF Paramedicine and was last seen 09/06/2019. At that time his pill counts were off for torsemide and hydralazine. He was rarely  taking torsemide or hydralazine. He was given pill box to start using again.   Admitted with GI bleed + A fib RVR + hypotension.   Had one small black liquid stool today.   Remains on Neo 40 mcg and amio drip 30 mg per hour.   He does not appear volume overloaded. Remains in A fib but rate < 100-110s.   Off eliquis with bleeding. Per GI planning scope tomorrow.   [name] Clegg 6:02 PM  Attending agree with above.  Patient well known to the HF service. 34 y/o morbidly obese male with OHA/OSA, RV failure, PAF and previous CVA.   Admitted overnight the recurrent AFL with RVR and hypotension in setting of what appears to be recent UGIB with hematemesis and melena.   Seen by Dr. Kalman Shan early this am and moved to ICU. Started on neo synephrine and IV amio. Eliquis held  Now in ICU. Back in NSR on IV amio. SBP improved on Neo at 40-50 but still running 90-100 range. No further bleeding. GI has seen and plan for EGD tomorrow. Holding Eliquis. Bicarb at baseline 49  General:  Obese male sitting in bed  No resp difficulty HEENT: normal Neck: supple. JVP to jaw. Carotids 2+ bilat; no bruits. No lymphadenopathy or thryomegaly appreciated. Cor: PMI nondisplaced. Regular rate & rhythm. No rubs, gallops or murmurs. Lungs: clear Abdomen: obese soft, nontender, nondistended. No hepatosplenomegaly. No bruits or masses. Good bowel sounds. Extremities: no cyanosis, clubbing, rash, edema Neuro: alert & orientedx3, cranial nerves grossly intact. moves all 4 extremities w/o difficulty. Affect pleasant  Remains tenuous but improved. Mainitaining NSR on IV amio. Would continue, Volume status ok. HGb stable. Wean neo as tolerated.    Additional CCT 35 min.   Glori Bickers, MD  11:03 PM

## 2019-09-09 NOTE — Progress Notes (Addendum)
Floor coverage  Patient admitted 8/14 for upper GI bleed/complaints of coffee-ground emesis and melena.  He is on Eliquis at home due to history of PFO with paradoxical embolus and stroke in September 2020.  Eliquis is being held.  He is receiving IV PPI.  Seen by GI on 8/15 and it was felt that he was not stable for endoscopic intervention, GI team will reevaluate again this morning.  Paged by nursing staff due to concern for hypotension and tachycardia.  Patient was seen and examined at bedside.  Awake and alert.  Resting comfortably.  Stated he felt a little short of breath and his heart was racing when he walked to the bathroom.  Symptoms have now resolved.  Denies chest pain.  Denies abdominal pain.  Per nursing staff, no further episodes of coffee-ground emesis or melena.  Review of telemetry at the time patient walked to the bathroom showing SVT with heart rate in the 170-180s.  Currently telemetry showing sinus rhythm with PVCs, heart rate 90-120.  BP low - most recent 91/51.  Lungs clear on exam and no lower extremity edema.  Abdomen soft and nontender to palpation.  Plan -Keep n.p.o. -Continue IV PPI -Continue to hold Eliquis -500 cc IV fluid bolus given hypotension.  Suspect volume depletion is causing tachycardia.  Goal is to keep MAP >65. Does have history of diastolic CHF but does not appear volume overloaded on exam. -Chest x-ray -Monitor H&H  -Hold home Coreg and Hydralazine given hypotension -EKG -Continue to monitor hemodynamics very closely -Transfer to progressive care unit  Update: Chest x-ray showing vascular congestion without pulmonary edema.  Does not appear volume overloaded on exam.  BNP added on.  Patient continues to be hypotensive and tachycardic.  Additional 1 L fluid bolus ordered.  EKG still pending, advised nursing staff to obtain it stat.  Stat H&H still pending.  Update: Patient continues to be hypotensive with systolic in the 82N to 56O and tachycardic  with rate ranging from 120s to 150s, intermittently up to 190s. Patient was again seen at bedside. Not complaining of chest pain, shortness of breath, or palpitations. Cardiology consulted. Critical care consulted and recommended starting the patient on phenylephrine.  Cardiology has reviewed telemetry and it is felt that the rhythm is atrial flutter. Recommending giving IV digoxin 0.25 mg once. Patient will be started on phenylephrine to support his blood pressure. Once systolic is above 90, start amiodarone infusion (no bolus). Treatment plan has been discussed with the patient's nurse.

## 2019-09-09 NOTE — Progress Notes (Signed)
NAME:  Douglas Hamrick., MRN:  [id number], DOB:  1985-07-21, LOS: 1 ADMISSION DATE:  09/07/2019, CONSULTATION DATE: 09/09/2019 REFERRING MD: Douglas [name], CHIEF COMPLAINT: Hypotension  Brief History   34 y/o M with PMH of of HFpEF with PFO and stroke in 2020 on Eliquis, OSA, HTN, atrial flutter who presented with coffee ground emesis on 8/15.  He was started on PPI and did not require transfusion, he had an episode of SVT in the early morning hours of 8/16 with hypotension so PCCM consulted.   History of present illness   [name] is a 34 y.o. M with PMH of HFpEF with PFO and stroke in 2020 on Eliquis who presented with coffee ground emesis on 8/15 and Hgb drop from baseline of 16 to 12. He was started on PPI and admitted.  GI felt he was too unstable for endoscopy and were to re-evaluate.   Overnight, pt had an episode of SVT while walking to the bathroom with heart racing and dyspnea.  This converted back to sinus rhythm, however BP downtrending.  No further hematemesis or melena, Repeat Hgb 12.  He was given 500cc IVF bolus and PCCM consulted  Past Medical History   Past Medical History:  Diagnosis Date  . Arthritis    "hands, feet" (10/31/2013)  . CHF (congestive heart failure) ([place])   . Chronic bronchitis ([place])   . High cholesterol   . Hypertension   . Obesity   . OSA on CPAP   . Pneumonia 09/2011  . Substance abuse ([place]) 8/31/213   cociane "first time use"  . Tobacco abuse    Significant Hospital Events   8/15 admit to hospitalist service 8/16 PCCM consult  Consults:  PCCM  Procedures:  None  Significant Diagnostic Tests:  Chest x-ray 8/16-vascular congestion  Micro Data:  8/14 Covid negative  Antimicrobials:     Interim history/subjective:  Feels a little bit better, no complaints  Objective   Blood pressure (!) 100/58, pulse 86, temperature 98.1 F (36.7 C), temperature source Oral, resp. rate 16, height 5\' 4"  (1.626 m), weight 115.7 kg, SpO2 92 %.          Intake/Output Summary (Last 24 hours) at 09/09/2019 1156 Last data filed at 09/09/2019 1100 Gross per 24 hour  Intake 2101.86 ml  Output 600 ml  Net 1501.86 ml   Filed Weights   09/08/19 1426  Weight: 115.7 kg    Examination: General: Awake and alert, in no distress HENT: Moist oral mucosa Lungs: Clear breath sounds bilaterally Cardiovascular: S1-S2 appreciated Abdomen: Bowel sounds appreciated Extremities: Skin is warm and dry Neuro: Alert and oriented x3 GU:   Resolved Hospital Problem list     Assessment & Plan:  Patient with hypotension in the setting of GI bleed Noted to be in atrial fibrillation -Hold Eliquis in the context of GI bleed -Plan for EGD tomorrow -Fluid resuscitation -Continue amiodarone -Wean pressors  Upper GI bleed -For EGD in a.m.  History of obstructive sleep apnea -Stable at present  Continue aggressive resuscitation and continue close monitoring Continue amiodarone for A. fib  Best practice:  Diet: npo Mobility: Bedrest Code Status: Full code Family Communication: Patient is fully awake and aware Disposition: ICU  Labs   CBC: Recent Labs  Lab 09/07/19 1615 09/08/19 0434 09/08/19 1057 09/09/19 0216  WBC 11.8*  --  15.5* 11.6*  NEUTROABS  --   --   --  9.1*  HGB 12.5* 11.2* 11.4* 10.1*  HCT 46.9 42.3  42.9 38.0*  MCV 102.6*  --  103.9* 103.5*  PLT 171  --  172 062    Basic Metabolic Panel: Recent Labs  Lab 09/07/19 1615 09/08/19 0434 09/08/19 0435 09/09/19 0216  NA 139  --   --  147*  K 6.1* 4.8  --  5.4*  CL 84*  --   --  88*  CO2 46*  --   --  >50*  GLUCOSE 103*  --  174* 102*  BUN 86*  --   --  74*  CREATININE 1.92*  --   --  1.63*  CALCIUM 8.6*  --   --  8.8*  MG  --   --   --  1.6*   GFR: Estimated Creatinine Clearance: 72.5 mL/min (A) (by C-G formula based on SCr of 1.63 mg/dL (H)). Recent Labs  Lab 09/07/19 1615 09/08/19 1057 09/09/19 0216  WBC 11.8* 15.5* 11.6*    Liver Function  Tests: Recent Labs  Lab 09/07/19 1615 09/09/19 0216  AST 23 22  ALT 19 12  ALKPHOS 77 60  BILITOT 2.0* 1.7*  PROT 5.3* 4.7*  ALBUMIN 2.6* 2.4*   Recent Labs  Lab 09/07/19 1615  LIPASE 21   No results for input(s): AMMONIA in the last 168 hours.  ABG    Component Value Date/Time   PHART 7.376 04/28/2015 0500   PCO2ART 55.5 (H) 04/28/2015 0500   PO2ART 65.6 (L) 04/28/2015 0500   HCO3 31.8 (H) 04/28/2015 0500   TCO2 45 (H) 08/14/2019 1027   O2SAT 90.0 04/28/2015 0500     Coagulation Profile: Recent Labs  Lab 09/08/19 0434  INR 1.3*    Cardiac Enzymes: No results for input(s): CKTOTAL, CKMB, CKMBINDEX, TROPONINI in the last 168 hours.  HbA1C: Hgb A1c MFr Bld  Date/Time Value Ref Range Status  06/20/2019 03:22 PM 5.7 (H) 4.8 - 5.6 % Final    Comment:    (NOTE) Pre diabetes:          5.7%-6.4% Diabetes:              >6.4% Glycemic control for   <7.0% adults with diabetes   10/14/2018 06:49 AM 5.6 4.8 - 5.6 % Final    Comment:    (NOTE) Pre diabetes:          5.7%-6.4% Diabetes:              >6.4% Glycemic control for   <7.0% adults with diabetes     CBG: Recent Labs  Lab 09/08/19 0659 09/08/19 1252  GLUCAP 84 80    Review of Systems:   denies any pain or discomfort at present  Past Medical History  He,  has a past medical history of Arthritis, CHF (congestive heart failure) ([place]), Chronic bronchitis ([place]), High cholesterol, Hypertension, Obesity, OSA on CPAP, Pneumonia (09/2011), Substance abuse ([place]) (8/31/213), and Tobacco abuse.   Surgical History    Past Surgical History:  Procedure Laterality Date  . BUBBLE STUDY  10/15/2018   Procedure: BUBBLE STUDY;  Surgeon: Douglas Dresser, MD;  Location: [place];  Service: Cardiovascular;;  . CARDIOVERSION N/A 07/15/2019   Procedure: CARDIOVERSION;  Surgeon: Douglas Dresser, MD;  Location: [facility] ENDOSCOPY;  Service: Cardiovascular;  Laterality: N/A;  . CARDIOVERSION N/A 08/14/2019   Procedure:  CARDIOVERSION;  Surgeon: Douglas Dresser, MD;  Location: [facility] ENDOSCOPY;  Service: Cardiovascular;  Laterality: N/A;  . NO PAST SURGERIES    . PATENT FORAMEN OVALE(PFO) CLOSURE N/A 05/16/2019   Procedure:  PATENT FORAMEN OVALE (PFO) CLOSURE;  Surgeon: Douglas Mocha, MD;  Location: [place] CV LAB;  Service: Cardiovascular;  Laterality: N/A;  . TEE WITHOUT CARDIOVERSION N/A 10/15/2018   Procedure: TRANSESOPHAGEAL ECHOCARDIOGRAM (TEE);  Surgeon: Douglas Dresser, MD;  Location: [facility] ENDOSCOPY;  Service: Cardiovascular;  Laterality: N/A;  . TEE WITHOUT CARDIOVERSION N/A 07/15/2019   Procedure: TRANSESOPHAGEAL ECHOCARDIOGRAM (TEE);  Surgeon: Douglas Dresser, MD;  Location[facility] ENDOSCOPY;  Service: Cardiovascular;  Laterality: N/A;  . TUBES IN EARS       Social History   reports that he quit smoking about 5 years ago. His smoking use included cigarettes. He has a 4.50 pack-year smoking history. He has never used smokeless tobacco. He reports that he does not drink alcohol and does not use drugs.   Family History   His family history includes Cancer in his paternal grandfather; Heart disease in his mother; Hypertension in his father. There is no history of Heart attack or Stroke.   Allergies Allergies  Allergen Reactions  . Lisinopril Cough    The patient is critically ill with multiple organ systems failure and requires high complexity decision making for assessment and support, frequent evaluation and titration of therapies, application of advanced monitoring technologies and extensive interpretation of multiple databases. Critical Care Time devoted to patient care services described in this note independent of APP/resident time (if applicable)  is 30 minutes.   Sherrilyn Rist MD [facility] Personal pager: [phone] If unanswered, please page CCM On-call: [phone]

## 2019-09-10 ENCOUNTER — Encounter (HOSPITAL_COMMUNITY): Payer: Self-pay | Admitting: Internal Medicine

## 2019-09-10 ENCOUNTER — Encounter (HOSPITAL_COMMUNITY)
Admission: EM | Disposition: A | Discharge status: Home / Self Care | Payer: Self-pay | Source: Home / Self Care | Attending: Internal Medicine

## 2019-09-10 ENCOUNTER — Inpatient Hospital Stay (HOSPITAL_COMMUNITY): Payer: Medicaid Other | Admitting: Anesthesiology

## 2019-09-10 DIAGNOSIS — I5082 Biventricular heart failure: Secondary | ICD-10-CM | POA: Diagnosis not present

## 2019-09-10 DIAGNOSIS — K921 Melena: Principal | ICD-10-CM

## 2019-09-10 DIAGNOSIS — J9611 Chronic respiratory failure with hypoxia: Secondary | ICD-10-CM | POA: Diagnosis not present

## 2019-09-10 DIAGNOSIS — D62 Acute posthemorrhagic anemia: Secondary | ICD-10-CM | POA: Diagnosis not present

## 2019-09-10 DIAGNOSIS — G4733 Obstructive sleep apnea (adult) (pediatric): Secondary | ICD-10-CM

## 2019-09-10 DIAGNOSIS — I2729 Other secondary pulmonary hypertension: Secondary | ICD-10-CM

## 2019-09-10 HISTORY — PX: ESOPHAGOGASTRODUODENOSCOPY (EGD) WITH PROPOFOL: SHX5813

## 2019-09-10 HISTORY — PX: BIOPSY: SHX5522

## 2019-09-10 LAB — HEMOGLOBIN AND HEMATOCRIT, BLOOD
HCT: 32.5 % — ABNORMAL LOW (ref 39.0–52.0)
Hemoglobin: 8.3 g/dL — ABNORMAL LOW (ref 13.0–17.0)

## 2019-09-10 LAB — BASIC METABOLIC PANEL
Anion gap: 7 (ref 5–15)
BUN: 45 mg/dL — ABNORMAL HIGH (ref 6–20)
CO2: 46 mmol/L — ABNORMAL HIGH (ref 22–32)
Calcium: 8.3 mg/dL — ABNORMAL LOW (ref 8.9–10.3)
Chloride: 88 mmol/L — ABNORMAL LOW (ref 98–111)
Creatinine, Ser: 1.72 mg/dL — ABNORMAL HIGH (ref 0.61–1.24)
GFR calc Af Amer: 58 mL/min — ABNORMAL LOW (ref >60)
GFR calc non Af Amer: 50 mL/min — ABNORMAL LOW (ref >60)
Glucose, Bld: 234 mg/dL — ABNORMAL HIGH (ref 70–99)
Potassium: 4.2 mmol/L (ref 3.5–5.1)
Sodium: 141 mmol/L (ref 135–145)

## 2019-09-10 LAB — GLUCOSE, CAPILLARY: Glucose-Capillary: 115 mg/dL — ABNORMAL HIGH (ref 70–99)

## 2019-09-10 SURGERY — ESOPHAGOGASTRODUODENOSCOPY (EGD) WITH PROPOFOL
Anesthesia: Monitor Anesthesia Care

## 2019-09-10 MED ORDER — PANTOPRAZOLE SODIUM 40 MG IV SOLR
40.0000 mg | INTRAVENOUS | Status: DC
  Administered 2019-09-11: 40 mg via INTRAVENOUS
  Filled 2019-09-10: qty 40

## 2019-09-10 MED ORDER — FENTANYL CITRATE (PF) 100 MCG/2ML IJ SOLN
12.5000 ug | Freq: Once | INTRAMUSCULAR | Status: AC
  Administered 2019-09-10: 12.5 ug via INTRAVENOUS
  Filled 2019-09-10: qty 2

## 2019-09-10 MED ORDER — PROPOFOL 500 MG/50ML IV EMUL
INTRAVENOUS | Status: DC | PRN
  Administered 2019-09-10: 75 ug/kg/min via INTRAVENOUS

## 2019-09-10 MED ORDER — GLYCOPYRROLATE 0.2 MG/ML IJ SOLN
INTRAMUSCULAR | Status: DC | PRN
  Administered 2019-09-10 (×2): .1 mg via INTRAVENOUS

## 2019-09-10 MED ORDER — AMIODARONE HCL 200 MG PO TABS
200.0000 mg | ORAL_TABLET | Freq: Every day | ORAL | Status: DC
  Administered 2019-09-10 – 2019-09-13 (×4): 200 mg via ORAL
  Filled 2019-09-10 (×4): qty 1

## 2019-09-10 MED ORDER — BUTAMBEN-TETRACAINE-BENZOCAINE 2-2-14 % EX AERO
INHALATION_SPRAY | CUTANEOUS | Status: DC | PRN
Start: 2019-09-10 — End: 2019-09-10
  Administered 2019-09-10: 1 via TOPICAL

## 2019-09-10 MED ORDER — PROPOFOL 10 MG/ML IV BOLUS
INTRAVENOUS | Status: DC | PRN
  Administered 2019-09-10: 20 mg via INTRAVENOUS

## 2019-09-10 MED ORDER — PROMETHAZINE HCL 25 MG/ML IJ SOLN: 6.2500 mg | INTRAMUSCULAR | Status: DC | PRN

## 2019-09-10 MED ORDER — DEXMEDETOMIDINE (PRECEDEX) IN NS 20 MCG/5ML (4 MCG/ML) IV SYRINGE
PREFILLED_SYRINGE | INTRAVENOUS | Status: DC | PRN
  Administered 2019-09-10 (×2): 4 ug via INTRAVENOUS

## 2019-09-10 MED FILL — Phenylephrine HCl IV Soln 10 MG/ML: INTRAVENOUS | Qty: 1 | Status: AC

## 2019-09-10 MED FILL — Sodium Chloride IV Soln 0.9%: INTRAVENOUS | Qty: 250 | Status: AC

## 2019-09-10 SURGICAL SUPPLY — 15 items

## 2019-09-10 NOTE — Op Note (Signed)
[facility] Patient Name: Douglas [name] Procedure Date : 09/10/2019 MRN: [id number] Attending MD: Ronnette Juniper , MD Date of Birth: 1985/06/19 CSN: [id number] Age: 34 Admit Type: Inpatient Procedure:                Upper GI endoscopy Indications:              Acute post hemorrhagic anemia, Coffee-ground emesis Providers:                Ronnette Juniper, MD, Benetta Spar RN, RN, Tyna Jaksch                            Technician, Laverda Sorenson, Technician Referring MD:             [facility] Medicines:                Monitored Anesthesia Care Complications:            No immediate complications. Estimated Blood Loss:     Estimated blood loss: none. Procedure:                Pre-Anesthesia Assessment:                           - Prior to the procedure, a History and Physical                            was performed, and patient medications and                            allergies were reviewed. The patient's tolerance of                            previous anesthesia was also reviewed. The risks                            and benefits of the procedure and the sedation                            options and risks were discussed with the patient.                            All questions were answered, and informed consent                            was obtained. Prior Anticoagulants: The patient has                            taken Eliquis (apixaban), last dose was 3 days                            prior to procedure. ASA Grade Assessment: IV - A                            patient with severe systemic disease that is a  constant threat to life. After reviewing the risks                            and benefits, the patient was deemed in                            satisfactory condition to undergo the procedure.                           After obtaining informed consent, the endoscope was                            passed under direct vision.  Throughout the                            procedure, the patient's blood pressure, pulse, and                            oxygen saturations were monitored continuously. The                            GIF-H190 ([id number]) Olympus gastroscope was                            introduced through the mouth, and advanced to the                            second part of duodenum. The upper GI endoscopy was                            extremely difficult due to the patient's oxygen                            desaturation. The patient tolerated the procedure                            well. Scope In: Scope Out: Findings:      A widely patent Schatzki ring was found at the gastroesophageal junction.      A 3 cm hiatal hernia was present.      A large, at least 3-5 cm, frond-like/villous, polypoid, sessile and       minimally ulcerated mass with thickened folds with no bleeding and no       stigmata of recent bleeding was found in the gastric antrum. Biopsies       were taken with a cold forceps for histology.      The cardia and gastric fundus were normal on retroflexion.      The examined duodenum was normal. Impression:               - Widely patent Schatzki ring.                           - 3 cm hiatal hernia.                           -  Gastric tumor in the gastric antrum. Biopsied.                           - Normal examined duodenum. Moderate Sedation:      Patient did not receive moderate sedation for this procedure, but       instead received monitored anesthesia care. Recommendation:           - Await pathology results, may need surgical                            consult thereafter.                           - Resume regular diet.                           - Continue present medications. Procedure Code(s):        --- Professional ---                           [phone], Esophagogastroduodenoscopy, flexible,                            transoral; with biopsy, single or multiple Diagnosis  Code(s):        --- Professional ---                           K22.2, Esophageal obstruction                           K44.9, Diaphragmatic hernia without obstruction or                            gangrene                           D49.0, Neoplasm of unspecified behavior of                            digestive system                           D62, Acute posthemorrhagic anemia                           K92.0, Hematemesis CPT copyright 2019 American Medical Association. All rights reserved. The codes documented in this report are preliminary and upon coder review may  be revised to meet current compliance requirements. Ronnette Juniper, MD 09/10/2019 11:53:59 AM This report has been signed electronically. Number of Addenda: 0

## 2019-09-10 NOTE — Progress Notes (Addendum)
Advanced Heart Failure Rounding Note  PCP-Cardiologist: No primary care provider on file.   Subjective:    Earlier today Neo stopped. Remains on amio drip 30 mg per hour.   S/P EGD- lesion noted antrum. Biopsy obtained.   Feels good. Denies SOB.    Objective:   Weight Range: 115.7 kg Body mass index is 43.78 kg/m.   Vital Signs:   Temp:  [96.9 F (36.1 C)-98.1 F (36.7 C)] 98.1 F (36.7 C) (08/17 1215) Pulse Rate:  [49-88] 61 (08/17 1215) Resp:  [11-30] 18 (08/17 1215) BP: (73-137)/(39-110) 121/90 (08/17 1215) SpO2:  [72 %-100 %] 96 % (08/17 1215) Last BM Date: 09/09/19  Weight change: Filed Weights   09/08/19 1426  Weight: 115.7 kg    Intake/Output:   Intake/Output Summary (Last 24 hours) at 09/10/2019 1255 Last data filed at 09/10/2019 1156 Gross per 24 hour  Intake 2768.74 ml  Output 850 ml  Net 1918.74 ml      Physical Exam    General:   No resp difficulty HEENT: Normal Neck: Supple. JVP 5-6 . Carotids 2+ bilat; no bruits. No lymphadenopathy or thyromegaly appreciated. Cor: PMI nondisplaced. Regular rate & rhythm. No rubs, gallops or murmurs. Lungs: Clear Abdomen: Soft, nontender, nondistended. No hepatosplenomegaly. No bruits or masses. Good bowel sounds. Extremities: No cyanosis, clubbing, rash, edema Neuro: Alert & orientedx3, cranial nerves grossly intact. moves all 4 extremities w/o difficulty. Affect pleasant   Telemetry   SB/SR with PACs 50-60s   EKG    None.   Labs    CBC Recent Labs    09/08/19 1057 09/09/19 0216  WBC 15.5* 11.6*  NEUTROABS  --  9.1*  HGB 11.4* 10.1*  HCT 42.9 38.0*  MCV 103.9* 103.5*  PLT 172 330   Basic Metabolic Panel Recent Labs    09/09/19 0216 09/09/19 2129  NA 147* 145  K 5.4* 4.5  CL 88* 91*  CO2 >50* 49*  GLUCOSE 102* 139*  BUN 74* 54*  CREATININE 1.63* 1.64*  CALCIUM 8.8* 8.6*  MG 1.6*  --    Liver Function Tests Recent Labs    09/07/19 1615 09/09/19 0216  AST 23 22  ALT  19 12  ALKPHOS 77 60  BILITOT 2.0* 1.7*  PROT 5.3* 4.7*  ALBUMIN 2.6* 2.4*   Recent Labs    09/07/19 1615  LIPASE 21   Cardiac Enzymes No results for input(s): CKTOTAL, CKMB, CKMBINDEX, TROPONINI in the last 72 hours.  BNP: BNP (last 3 results) Recent Labs    02/14/19 1522 06/20/19 1522 09/09/19 0219  BNP 437.7* 243.3* 214.9*    ProBNP (last 3 results) No results for input(s): PROBNP in the last 8760 hours.   D-Dimer No results for input(s): DDIMER in the last 72 hours. Hemoglobin A1C No results for input(s): HGBA1C in the last 72 hours. Fasting Lipid Panel No results for input(s): CHOL, HDL, LDLCALC, TRIG, CHOLHDL, LDLDIRECT in the last 72 hours. Thyroid Function Tests No results for input(s): TSH, T4TOTAL, T3FREE, THYROIDAB in the last 72 hours.  Invalid input(s): FREET3  Other results:   Imaging     No results found.   Medications:     Scheduled Medications: . sodium chloride   Intravenous Once  . atorvastatin  80 mg Oral q1800  . Chlorhexidine Gluconate Cloth  6 each Topical Daily  . pantoprazole (PROTONIX) IV  40 mg Intravenous Q12H  . sodium chloride flush  10-40 mL Intracatheter Q12H  . sucralfate  1 g  Oral TID WC & HS     Infusions: . sodium chloride 10 mL/hr at 09/10/19 1126  . amiodarone 30 mg/hr (09/10/19 1126)  . phenylephrine (NEO-SYNEPHRINE) Adult infusion Stopped (09/10/19 0642)     PRN Medications:  sodium chloride, acetaminophen **OR** acetaminophen, albuterol, ondansetron **OR** ondansetron (ZOFRAN) IV, polyethylene glycol, sodium chloride flush, sodium chloride flush     Assessment/Plan   1.GIB, upper Hematemesis/melena on admit. GI consulted.  EGD with lesion noted in antrum. Biopsy sent.  On PPI.  Ok per GI to start eliquis 09/11/19  2. PAF S/P DC-CV 07/15/19. Back in A fib RVR on admit.  Started on amio drip for A fib RVR. Now in Sinus [place]. - Stop amio drip.  - As above can restart eliquis tomorrow.  - Plan  to start carvedilol again tomorrow. At home he was taking carvedilol 25 mg twice a day .  3. 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  RV failure is in the setting of OSA/OHS.  Volume status stable does not need diuretics yet.  - Tomorrow will need to   4. OHS/OSA  On chronic 2 liters [place].  - Needs another sleep study but he cant afford the co pay.   4. H/O Stroke -On statin.  - Restarting eliquis hopefully tomorrow.   5. H/O PFO closure 2020     Length of Stay: 2  [name] Clegg, NP  09/10/2019, 12:55 PM  Advanced Heart Failure Team Pager [phone] (M-F; 7a - 4p)  Please contact [facility] for night-coverage after hours (4p -7a ) and weekends on [email or web address]  Patient seen and examined with the above-signed Advanced Practice Provider and/or Housestaff. I personally reviewed laboratory data, imaging studies and relevant notes. I independently examined the patient and formulated the important aspects of the plan. I have edited the note to reflect any of my changes or salient points. I have personally discussed the plan with the patient and/or family.  No further bleeding. He is back in NSR on IV amio. Off neosynephrine,   EGD today reviewed personally. Non-bleeding antral mass which was biopsied  General: Obese male sitting in chair.  No resp difficulty HEENT: normal Neck: supple. no JVD. Carotids 2+ bilat; no bruits. No lymphadenopathy or thryomegaly appreciated. Cor: PMI nondisplaced. Regular rate & rhythm. No rubs, gallops or murmurs. Lungs: clear Abdomen: soft, nontender, nondistended. No hepatosplenomegaly. No bruits or masses. Good bowel sounds. Extremities: no cyanosis, clubbing, rash, edema Neuro: alert & orientedx3, cranial nerves grossly intact. moves all 4 extremities w/o difficulty. Affect pleasant  Can switch to po amio.Resume home meds. Hold AC for now. Await  biopsies.   Glori Bickers, MD  8:10 PM

## 2019-09-10 NOTE — Transfer of Care (Signed)
Immediate Anesthesia Transfer of Care Note  Patient: Douglas [name].  Procedure(s) Performed: ESOPHAGOGASTRODUODENOSCOPY (EGD) WITH PROPOFOL (N/A ) BIOPSY  Patient Location: PACU  Anesthesia Type:MAC  Level of Consciousness: drowsy and patient cooperative  Airway & Oxygen Therapy: Patient Spontanous Breathing and Patient connected to face mask oxygen  Post-op Assessment: Report given to RN and Post -op Vital signs reviewed and stable  Post vital signs: Reviewed and stable  Last Vitals:  Vitals Value Taken Time  BP 126/70 09/10/19 1200  Temp 36.7 C 09/10/19 1200  Pulse 64 09/10/19 1205  Resp 16 09/10/19 1205  SpO2 100 % 09/10/19 1205  Vitals shown include unvalidated device data.  Last Pain:  Vitals:   09/10/19 1200  TempSrc:   PainSc: 0-No pain      Patients Stated Pain Goal: 0 (68/03/21 2248)  Complications: No complications documented.

## 2019-09-10 NOTE — Progress Notes (Signed)
NAME:  Douglas [name]., MRN:  [id number], DOB:  12-Apr-1985, LOS: 2 ADMISSION DATE:  09/07/2019, CONSULTATION DATE: 09/09/2019 REFERRING MD: Douglas [name], CHIEF COMPLAINT: Hypotension  Brief History   34 y/o M with PMH of of HFpEF with PFO and stroke in 2020 on Eliquis, OSA, HTN, atrial flutter who presented with coffee ground emesis on 8/15.  He was started on PPI and did not require transfusion, he had an episode of SVT in the early morning hours of 8/16 with hypotension so PCCM consulted.   History of present illness   Mr [name] is a 34 y.o. M with PMH of HFpEF with PFO and stroke in 2020 on Eliquis who presented with coffee ground emesis on 8/15 and Hgb drop from baseline of 16 to 12. He was started on PPI and admitted.  GI felt he was too unstable for endoscopy and were to re-evaluate.   Overnight, pt had an episode of SVT while walking to the bathroom with heart racing and dyspnea.  This converted back to sinus rhythm, however BP downtrending.  No further hematemesis or melena, Repeat Hgb 12.  He was given 500cc IVF bolus and PCCM consulted  Past Medical History   Past Medical History:  Diagnosis Date   Arthritis    "hands, feet" (10/31/2013)   CHF (congestive heart failure) (HCC)    Chronic bronchitis (HCC)    High cholesterol    Hypertension    Obesity    OSA on CPAP    Pneumonia 09/2011   Substance abuse ([place]) 8/31/213   cociane "first time use"   Tobacco abuse    Significant Hospital Events   8/15 admit to hospitalist service 8/16 PCCM consult  Consults:  PCCM Heart Failure  Procedures:  None  Significant Diagnostic Tests:  Chest x-ray 8/16-vascular congestion  Micro Data:  8/14 Covid negative  Antimicrobials:   n/a  Interim history/subjective:  Sleepy this morning, easily arousable, Hgb drifting down, repeat ordered this AM. Off phenylephrine. NSR on IV amio drip. EGD to be performed today per report.   Objective   Blood pressure 124/82,  pulse (!) 58, temperature 97.7 F (36.5 C), temperature source Axillary, resp. rate (!) 23, height 5\' 4"  (1.626 m), weight 115.7 kg, SpO2 92 %.        Intake/Output Summary (Last 24 hours) at 09/10/2019 1002 Last data filed at 09/10/2019 0600 Gross per 24 hour  Intake 2500.36 ml  Output 850 ml  Net 1650.36 ml   Filed Weights   09/08/19 1426  Weight: 115.7 kg    Examination: General: asleep, in no distress Lungs: Clear breath sounds bilaterally Cardiovascular: S1-S2 appreciated Abdomen: Bowel sounds appreciated Extremities: Skin is warm and dry Neuro: Alert and oriented x3  Resolved Hospital Problem list     Assessment & Plan:  Hypotension:  hypotension in the setting of GI bleed, likely hypovolemic exacerbated by Afib w/ RVR -Hold Eliquis in the context of GI bleed -Plan for EGD 8/17 -Continue amiodarone IV for now -pressors off  GI bleed: presumed upper  -EGD reported  -PPI IV BID  History of obstructive sleep apnea -NIPPV at night  Polycythemia: baseline Hgb 18-20. presumed from chronic hypoxemia not well treated/nonadherent.  --Stress importance of O2 supplementation, NIPPV at night   Heart failure: Diastolic and RV failure.  --Amio to maintain NSR -- Appreciate HF team assistance  Presuemd pulmonary HTN: Based on TTE with elevated PA pressures, RA dilation, RV dilation/dysfucntion. Likely multifactorial and related to group 2 disease (  Diastolic dysfunction, valvular abnormalities) and group 3 disease (hypoxemia, OSA/OHS). --Appears euvolemic, cautious fluid resuscitation --Holding diuretics at this time given GI bleed   Best practice:  Diet: npo for procedure Mobility: Bedrest Code Status: Full code Family Communication: Patient is fully awake and aware Disposition: transfer to stepdown after EGD  Labs   CBC: Recent Labs  Lab 09/07/19 1615 09/08/19 0434 09/08/19 1057 09/09/19 0216  WBC 11.8*  --  15.5* 11.6*  NEUTROABS  --   --   --  9.1*  HGB  12.5* 11.2* 11.4* 10.1*  HCT 46.9 42.3 42.9 38.0*  MCV 102.6*  --  103.9* 103.5*  PLT 171  --  172 956    Basic Metabolic Panel: Recent Labs  Lab 09/07/19 1615 09/08/19 0434 09/08/19 0435 09/09/19 0216 09/09/19 2129  NA 139  --   --  147* 145  K 6.1* 4.8  --  5.4* 4.5  CL 84*  --   --  88* 91*  CO2 46*  --   --  >50* 49*  GLUCOSE 103*  --  174* 102* 139*  BUN 86*  --   --  74* 54*  CREATININE 1.92*  --   --  1.63* 1.64*  CALCIUM 8.6*  --   --  8.8* 8.6*  MG  --   --   --  1.6*  --    GFR: Estimated Creatinine Clearance: 72 mL/min (A) (by C-G formula based on SCr of 1.64 mg/dL (H)). Recent Labs  Lab 09/07/19 1615 09/08/19 1057 09/09/19 0216  WBC 11.8* 15.5* 11.6*    Liver Function Tests: Recent Labs  Lab 09/07/19 1615 09/09/19 0216  AST 23 22  ALT 19 12  ALKPHOS 77 60  BILITOT 2.0* 1.7*  PROT 5.3* 4.7*  ALBUMIN 2.6* 2.4*   Recent Labs  Lab 09/07/19 1615  LIPASE 21   No results for input(s): AMMONIA in the last 168 hours.  ABG    Component Value Date/Time   PHART 7.376 04/28/2015 0500   PCO2ART 55.5 (H) 04/28/2015 0500   PO2ART 65.6 (L) 04/28/2015 0500   HCO3 31.8 (H) 04/28/2015 0500   TCO2 45 (H) 08/14/2019 1027   O2SAT 90.0 04/28/2015 0500     Coagulation Profile: Recent Labs  Lab 09/08/19 0434  INR 1.3*    Cardiac Enzymes: No results for input(s): CKTOTAL, CKMB, CKMBINDEX, TROPONINI in the last 168 hours.  HbA1C: Hgb A1c MFr Bld  Date/Time Value Ref Range Status  06/20/2019 03:22 PM 5.7 (H) 4.8 - 5.6 % Final    Comment:    (NOTE) Pre diabetes:          5.7%-6.4% Diabetes:              >6.4% Glycemic control for   <7.0% adults with diabetes   10/14/2018 06:49 AM 5.6 4.8 - 5.6 % Final    Comment:    (NOTE) Pre diabetes:          5.7%-6.4% Diabetes:              >6.4% Glycemic control for   <7.0% adults with diabetes     CBG: Recent Labs  Lab 09/08/19 0659 09/08/19 1252  GLUCAP 84 80    Review of Systems:   denies  any pain or discomfort at present  Past Medical History  He,  has a past medical history of Arthritis, CHF (congestive heart failure) ([place]), Chronic bronchitis ([place]), High cholesterol, Hypertension, Obesity, OSA on CPAP, Pneumonia (09/2011), Substance  abuse ([place]) (8/31/213), and Tobacco abuse.   Surgical History    Past Surgical History:  Procedure Laterality Date   BUBBLE STUDY  10/15/2018   Procedure: BUBBLE STUDY;  Surgeon: Larey Dresser, MD;  Location: [facility];  Service: Cardiovascular;;   CARDIOVERSION N/A 07/15/2019   Procedure: CARDIOVERSION;  Surgeon: Larey Dresser, MD;  Location: [facility] ENDOSCOPY;  Service: Cardiovascular;  Laterality: N/A;   CARDIOVERSION N/A 08/14/2019   Procedure: CARDIOVERSION;  Surgeon: Larey Dresser, MD;  Location: [facility] ENDOSCOPY;  Service: Cardiovascular;  Laterality: N/A;   NO PAST SURGERIES     PATENT FORAMEN OVALE(PFO) CLOSURE N/A 05/16/2019   Procedure: PATENT FORAMEN OVALE (PFO) CLOSURE;  Surgeon: Sherren Mocha, MD;  Location: [place] CV LAB;  Service: Cardiovascular;  Laterality: N/A;   TEE WITHOUT CARDIOVERSION N/A 10/15/2018   Procedure: TRANSESOPHAGEAL ECHOCARDIOGRAM (TEE);  Surgeon: Larey Dresser, MD;  Location: [facility] ENDOSCOPY;  Service: Cardiovascular;  Laterality: N/A;   TEE WITHOUT CARDIOVERSION N/A 07/15/2019   Procedure: TRANSESOPHAGEAL ECHOCARDIOGRAM (TEE);  Surgeon: Larey Dresser, MD;  Location: [facility] ENDOSCOPY;  Service: Cardiovascular;  Laterality: N/A;   TUBES IN EARS       Social History   reports that he quit smoking about 5 years ago. His smoking use included cigarettes. He has a 4.50 pack-year smoking history. He has never used smokeless tobacco. He reports that he does not drink alcohol and does not use drugs.   Family History   His family history includes Cancer in his paternal grandfather; Heart disease in his mother; Hypertension in his father. There is no history of Heart attack or Stroke.    Allergies Allergies  Allergen Reactions   Lisinopril Cough       CRITICAL CARE Performed by: Bonna Gains Massie Mees   Total critical care time: 30 minutes  Critical care time was exclusive of separately billable procedures and treating other patients.  Critical care was necessary to treat or prevent imminent or life-threatening deterioration.  Critical care was time spent personally by me on the following activities: development of treatment plan with patient and/or surrogate as well as nursing, discussions with consultants, evaluation of patient's response to treatment, examination of patient, obtaining history from patient or surrogate, ordering and performing treatments and interventions, ordering and review of laboratory studies, ordering and review of radiographic studies, pulse oximetry, re-evaluation of patient's condition and participation in multidisciplinary rounds.  Virgel Bouquet, MD  ICU Physician [place]  Pager: [phone] Mobile: [phone]   09/10/2019, 10:13 AM

## 2019-09-10 NOTE — Brief Op Note (Signed)
09/07/2019 - 09/10/2019  11:54 AM  PATIENT:  Douglas [name].  34 y.o. male  PRE-OPERATIVE DIAGNOSIS:  coffee ground emesis, melena  POST-OPERATIVE DIAGNOSIS:  * No post-op diagnosis entered *  PROCEDURE:  Procedure(s): ESOPHAGOGASTRODUODENOSCOPY (EGD) WITH PROPOFOL (N/A) BIOPSY  SURGEON:  Surgeon(s) and Role:    Ronnette Juniper, MD - Primary  PHYSICIAN ASSISTANT:   ASSISTANTS: Wallie Char Holloway/Darlene Rosana Hoes, Tech  ANESTHESIA:   MAC  EBL:  Minimal  BLOOD ADMINISTERED:none  DRAINS: none   LOCAL MEDICATIONS USED:  NONE  SPECIMEN:  Biopsy / Limited Resection  DISPOSITION OF SPECIMEN:  PATHOLOGY  COUNTS:  YES  TOURNIQUET:  * No tourniquets in log *  DICTATION: .Dragon Dictation  PLAN OF CARE: Admit to inpatient   PATIENT DISPOSITION:  PACU - hemodynamically stable.   Delay start of Pharmacological VTE agent (>24hrs) due to surgical blood loss or risk of bleeding: yes

## 2019-09-10 NOTE — Anesthesia Preprocedure Evaluation (Addendum)
Anesthesia Evaluation  Patient identified by MRN, date of birth, ID band Patient awake    Reviewed: Allergy & Precautions, NPO status , Patient's Chart, lab work & pertinent test results, reviewed documented beta blocker date and time   History of Anesthesia Complications Negative for: history of anesthetic complications  Airway Mallampati: II  TM Distance: >3 FB Neck ROM: Full   Comment: Fully bearded Dental no notable dental hx. (+) Teeth Intact   Pulmonary asthma , sleep apnea and Continuous Positive Airway Pressure Ventilation , former smoker,  On home O2   Pulmonary exam normal breath sounds clear to auscultation       Cardiovascular hypertension, Pt. on medications and Pt. on home beta blockers +CHF  Normal cardiovascular exam+ dysrhythmias (on Eliquis ) Atrial Fibrillation  Rhythm:Regular Rate:Normal  TEE 07/15/19: EF 55-60%, severe RV dysfunction, severe RVE, D-shaped interventricular septum suggestive of RV  pressure/volume overload, severe RAE, PFO closure device present with small amount of residual leakage   On Amiodarone gtt and Phenylephrine gtt   Neuro/Psych CVA (09/2018) negative psych ROS   GI/Hepatic negative GI ROS, Neg liver ROS,   Endo/Other  Morbid obesity  Renal/GU Renal Insufficiency and CRFRenal disease  negative genitourinary   Musculoskeletal  (+) Arthritis ,   Abdominal   Peds  Hematology negative hematology ROS (+) anemia ,   Anesthesia Other Findings   Reproductive/Obstetrics negative OB ROS                            Anesthesia Physical  Anesthesia Plan  ASA: III  Anesthesia Plan: MAC   Post-op Pain Management:    Induction: Intravenous  PONV Risk Score and Plan: Treatment may vary due to age or medical condition and Propofol infusion  Airway Management Planned: Natural Airway and Nasal Cannula  Additional Equipment: None  Intra-op Plan:    Post-operative Plan:   Informed Consent: I have reviewed the patients History and Physical, chart, labs and discussed the procedure including the risks, benefits and alternatives for the proposed anesthesia with the patient or authorized representative who has indicated his/her understanding and acceptance.     Dental advisory given  Plan Discussed with:   Anesthesia Plan Comments:         Anesthesia Quick Evaluation

## 2019-09-10 NOTE — Op Note (Signed)
EGD showed:  Widely patent Schatzki's ring, small hiatal hernia. A fungating polypoid sessile large protruding lesion with thickened folds and minimal ulceration noted in the antrum, multiple biopsies taken. Normal cardia, fundus, duodenal bulb and rest of the duodenum.   Recommendations: Resume regular diet. PPI once a day. Await pathology results, may need surgical resection depending upon biopsy. If hemoglobin stable, plan to resume Eliquis in a.m.Marland Kitchen  Ronnette Juniper, MD.

## 2019-09-10 NOTE — Anesthesia Postprocedure Evaluation (Signed)
Anesthesia Post Note  Patient: Douglas [name].  Procedure(s) Performed: ESOPHAGOGASTRODUODENOSCOPY (EGD) WITH PROPOFOL (N/A ) BIOPSY     Patient location during evaluation: PACU Anesthesia Type: MAC Level of consciousness: awake and alert Pain management: pain level controlled Vital Signs Assessment: post-procedure vital signs reviewed and stable Respiratory status: spontaneous breathing, nonlabored ventilation, respiratory function stable and patient connected to nasal cannula oxygen Cardiovascular status: stable and blood pressure returned to baseline Postop Assessment: no apparent nausea or vomiting Anesthetic complications: no   No complications documented.  Last Vitals:  Vitals:   09/10/19 1215 09/10/19 1300  BP: 121/90   Pulse: 61 (!) 56  Resp: 18 16  Temp: 36.7 C   SpO2: 96% 94%    Last Pain:  Vitals:   09/10/19 1215  TempSrc:   PainSc: 0-No pain                 Effie Berkshire

## 2019-09-10 NOTE — Interval H&P Note (Signed)
History and Physical Interval Note: 34/male with coffee ground emesis,melena, last dose of eliquis was 3 days ago,multiple comorbidities for an EGD.  09/10/2019 10:43 AM  Douglas [name].  has presented today for EGD, with the diagnosis of coffee ground emesis, melena.  The various methods of treatment have been discussed with the patient and family. After consideration of risks, benefits and other options for treatment, the patient has consented to  Procedure(s): ESOPHAGOGASTRODUODENOSCOPY (EGD) WITH PROPOFOL (N/A) as a surgical intervention.  The patient's history has been reviewed, patient examined, no change in status, stable for surgery.  I have reviewed the patient's chart and labs.  Questions were answered to the patient's satisfaction.     Ronnette Juniper

## 2019-09-10 NOTE — Progress Notes (Signed)
Received patient from [facility] via wheelchair. With 02 @2L , saturating well. Right PICCtriple lumen  line, saline locked and LUA midline single lumen, saline locked. skin warma and dry. Patient has been orientated to the room, unit and staff.Orders to be reviewed and implemented.Call light has been placed within reach.

## 2019-09-10 NOTE — Progress Notes (Signed)
[place] Progress Note Patient Name: Douglas [name]. DOB: December 31, 1985 MRN: [id number]   Date of Service  09/10/2019  HPI/Events of Note  Patient c/o R arm pain related to PICC line. NPO for EGD in AM.  eICU Interventions  Plan: 1. Fentanyl 12.5 mcg IV X 1 now.      Intervention Category Major Interventions: Other:  Lysle Dingwall 09/10/2019, 5:13 AM

## 2019-09-11 MED ORDER — CARVEDILOL 3.125 MG PO TABS
3.1250 mg | ORAL_TABLET | Freq: Two times a day (BID) | ORAL | Status: DC
  Administered 2019-09-11 – 2019-09-12 (×3): 3.125 mg via ORAL
  Filled 2019-09-11 (×3): qty 1

## 2019-09-11 MED ORDER — HYDRALAZINE HCL 25 MG PO TABS
12.5000 mg | ORAL_TABLET | Freq: Three times a day (TID) | ORAL | Status: DC
  Administered 2019-09-11 – 2019-09-13 (×6): 12.5 mg via ORAL
  Filled 2019-09-11 (×6): qty 1

## 2019-09-11 MED ORDER — PANTOPRAZOLE SODIUM 40 MG PO TBEC
40.0000 mg | DELAYED_RELEASE_TABLET | Freq: Every day | ORAL | Status: DC
  Administered 2019-09-12 – 2019-09-13 (×2): 40 mg via ORAL
  Filled 2019-09-11 (×2): qty 1

## 2019-09-11 MED ORDER — TORSEMIDE 20 MG PO TABS
40.0000 mg | ORAL_TABLET | Freq: Every day | ORAL | Status: DC
  Administered 2019-09-11: 40 mg via ORAL
  Filled 2019-09-11 (×2): qty 2

## 2019-09-11 NOTE — Progress Notes (Signed)
PROGRESS NOTE    Douglas [name].  VEL:[id number] DOB: [birth year] DOA: 09/07/2019 PCP: Patient, No Pcp Per  Brief Narrative: 34 year old chronically ill male with chronic diastolic CHF/right heart failure, paroxysmal A. fib, PFO and CVA in 2020 on Eliquis, sleep apnea, hypertension was admitted to the ICU with hypotension, SVT, ongoing melena and coffee-ground emesis. -Admitted to the ICU, treated with Neo-Synephrine and IV amiodarone -Gastroenterology and cardiology following, -Underwent endoscopy yesterday which showed a large fungating polypoid lesion in the gastric antrum  Assessment & Plan:   Gastric mass Acute blood loss anemia -Follow-up biopsy results, continue PPI changed to p.o. -Possibly resume anticoagulation tomorrow if stable, high stroke risk given prior CVA w/, A. Fib -CBC in am  Hypotension -Likely hypovolemic shock in the setting of upper GI bleed exacerbated by A. fib RVR -Improved, off Neo-Synephrine -Treated with IV amiodarone as well, now p.o. -Cardiology following -Plan to resume oral diuretics today  Chronic diastolic CHF Right heart failure/pulmonary hypertension -Required fluid resuscitation in the ICU, diuretics were held initially -Restarting oral diuretics today -CHF team following  History of paroxysmal A. Fib History of CVA -Underwent DC cardioversion recently on 6/21 -IV amiodarone changed to p.o. today -Continue statin, Eliquis on hold, hopefully resume 8/19  OSA/OHS Obesity, BMI 43.7 Chronic respiratory failure -Continue NIPPV at night -On 2 L home O2 at baseline -Needs another sleep study -Weight loss, lifestyle modification recommended  DVT prophylaxis: SCDs Code Status: Full code Family Communication: Discussed patient in detail, no family at bedside Disposition Plan:  Status is: Inpatient  Remains inpatient appropriate because:Inpatient level of care appropriate due to severity of illness   Dispo: The patient is from:  Home              Anticipated d/c is to: Home              Anticipated d/c date is: 2 days              Patient currently is not medically stable to d/c.  Consultants:   Cards, PCCM Tx, [facility]    Procedures: EGD 8/17 noted widely patent Schatzki ring, small hiatal hernia and large fungating polypoid lesion in the gastric antrum  Antimicrobials:    Subjective: -Feels okay, no events overnight, breathing fairly okay  Objective: Vitals:   09/11/19 0024 09/11/19 0033 09/11/19 0541 09/11/19 0900  BP: (!) 142/94  (!) 110/58 (!) 157/105  Pulse: (!) 55  (!) 54 (!) 55  Resp: 18  17 18   Temp: 98 F (36.7 C)  97.9 F (36.6 C) 98.2 F (36.8 C)  TempSrc: Oral  Oral Oral  SpO2: 100% 100% 99% 100%  Weight:      Height:        Intake/Output Summary (Last 24 hours) at 09/11/2019 1437 Last data filed at 09/11/2019 1300 Gross per 24 hour  Intake 1360 ml  Output 1050 ml  Net 310 ml   Filed Weights   09/08/19 1426  Weight: 115.7 kg    Examination:  General exam: Obese pleasant male sitting up in the recliner, AAOx3 CVS: S1-S2, irregularly irregular Lungs: Clear, diminished at bases Abdomen: Soft, obese, nontender, bowel sounds present Extremities: Trace edema  Skin: No rashes on exposed skin  psychiatry: Flat affect    Data Reviewed:   CBC: Recent Labs  Lab 09/07/19 1615 09/08/19 0434 09/08/19 1057 09/09/19 0216 09/10/19 1522  WBC 11.8*  --  15.5* 11.6*  --   NEUTROABS  --   --   --  9.1*  --   HGB 12.5* 11.2* 11.4* 10.1* 8.3*  HCT 46.9 42.3 42.9 38.0* 32.5*  MCV 102.6*  --  103.9* 103.5*  --   PLT 171  --  172 165  --    Basic Metabolic Panel: Recent Labs  Lab 09/07/19 1615 09/08/19 0434 09/08/19 0435 09/09/19 0216 09/09/19 2129 09/10/19 1522  NA 139  --   --  147* 145 141  K 6.1* 4.8  --  5.4* 4.5 4.2  CL 84*  --   --  88* 91* 88*  CO2 46*  --   --  >50* 49* 46*  GLUCOSE 103*  --  174* 102* 139* 234*  BUN 86*  --   --  74* 54* 45*  CREATININE  1.92*  --   --  1.63* 1.64* 1.72*  CALCIUM 8.6*  --   --  8.8* 8.6* 8.3*  MG  --   --   --  1.6*  --   --    GFR: Estimated Creatinine Clearance: 68.7 mL/min (A) (by C-G formula based on SCr of 1.72 mg/dL (H)). Liver Function Tests: Recent Labs  Lab 09/07/19 1615 09/09/19 0216  AST 23 22  ALT 19 12  ALKPHOS 77 60  BILITOT 2.0* 1.7*  PROT 5.3* 4.7*  ALBUMIN 2.6* 2.4*   Recent Labs  Lab 09/07/19 1615  LIPASE 21   No results for input(s): AMMONIA in the last 168 hours. Coagulation Profile: Recent Labs  Lab 09/08/19 0434  INR 1.3*   Cardiac Enzymes: No results for input(s): CKTOTAL, CKMB, CKMBINDEX, TROPONINI in the last 168 hours. BNP (last 3 results) No results for input(s): PROBNP in the last 8760 hours. HbA1C: No results for input(s): HGBA1C in the last 72 hours. CBG: Recent Labs  Lab 09/08/19 0659 09/08/19 1252 09/10/19 1648  GLUCAP 84 80 115*   Lipid Profile: No results for input(s): CHOL, HDL, LDLCALC, TRIG, CHOLHDL, LDLDIRECT in the last 72 hours. Thyroid Function Tests: No results for input(s): TSH, T4TOTAL, FREET4, T3FREE, THYROIDAB in the last 72 hours. Anemia Panel: Recent Labs    09/09/19 0216  FERRITIN 22*  TIBC 290  IRON 38*   Urine analysis:    Component Value Date/Time   COLORURINE STRAW (A) 09/07/2019 2119   APPEARANCEUR CLEAR 09/07/2019 2119   LABSPEC 1.009 09/07/2019 2119   PHURINE 7.0 09/07/2019 2119   GLUCOSEU NEGATIVE 09/07/2019 2119   HGBUR NEGATIVE 09/07/2019 2119   [place] NEGATIVE 09/07/2019 2119   KETONESUR NEGATIVE 09/07/2019 2119   PROTEINUR NEGATIVE 09/07/2019 2119   UROBILINOGEN 1.0 06/09/2014 1527   NITRITE NEGATIVE 09/07/2019 2119   LEUKOCYTESUR NEGATIVE 09/07/2019 2119   Sepsis Labs: @LABRCNTIP (procalcitonin:4,lacticidven:4)  ) Recent Results (from the past 240 hour(s))  SARS Coronavirus 2 by RT PCR (hospital order, performed in [facility] hospital lab) Nasopharyngeal Nasopharyngeal Swab     Status: None     Collection Time: 09/07/19 11:49 PM   Specimen: Nasopharyngeal Swab  Result Value Ref Range Status   SARS Coronavirus 2 NEGATIVE NEGATIVE Final    Comment: (NOTE) SARS-CoV-2 target nucleic acids are NOT DETECTED.  The SARS-CoV-2 RNA is generally detectable in upper and lower respiratory specimens during the acute phase of infection. The lowest concentration of SARS-CoV-2 viral copies this assay can detect is 250 copies / mL. A negative result does not preclude SARS-CoV-2 infection and should not be used as the sole basis for treatment or other patient management decisions.  A negative result may occur with improper  specimen collection / handling, submission of specimen other than nasopharyngeal swab, presence of viral mutation(s) within the areas targeted by this assay, and inadequate number of viral copies (<250 copies / mL). A negative result must be combined with clinical observations, patient history, and epidemiological information.  Fact Sheet for Patients:   StrictlyIdeas.no  Fact Sheet for Healthcare Providers: BankingDealers.co.za  This test is not yet approved or  cleared by the [place] FDA and has been authorized for detection and/or diagnosis of SARS-CoV-2 by FDA under an Emergency Use Authorization (EUA).  This EUA will remain in effect (meaning this test can be used) for the duration of the COVID-19 declaration under Section 564(b)(1) of the Act, 21 U.S.C. section 360bbb-3(b)(1), unless the authorization is terminated or revoked sooner.  Performed at [facility], [place] [address] [place], [place] [address]   MRSA PCR Screening     Status: None   Collection Time: 09/09/19  2:53 AM   Specimen: Nasopharyngeal  Result Value Ref Range Status   MRSA by PCR NEGATIVE NEGATIVE Final    Comment:        The GeneXpert MRSA Assay (FDA approved for NASAL specimens only), is one component of a comprehensive MRSA  colonization surveillance program. It is not intended to diagnose MRSA infection nor to guide or monitor treatment for MRSA infections. Performed at [facility], [place] [address], [place], [place] [address]          Radiology Studies: No results found.      Scheduled Meds:  sodium chloride   Intravenous Once   amiodarone  200 mg Oral Daily   atorvastatin  80 mg Oral q1800   carvedilol  3.125 mg Oral BID WC   Chlorhexidine Gluconate Cloth  6 each Topical Daily   hydrALAZINE  12.5 mg Oral Q8H   [START ON 09/12/2019] pantoprazole  40 mg Oral Daily   sodium chloride flush  10-40 mL Intracatheter Q12H   torsemide  40 mg Oral Daily   Continuous Infusions:  sodium chloride 10 mL/hr at 09/10/19 1126     LOS: 3 days    Time spent: 54min  Domenic Polite, MD [facility]  09/11/2019, 2:37 PM

## 2019-09-11 NOTE — Progress Notes (Addendum)
Advanced Heart Failure Rounding Note  PCP-Cardiologist: No primary care provider on file.   Subjective:    S/P EGD- lesion noted antrum. Biopsy obtained.   Denies SOB.    Objective:   Weight Range: 115.7 kg Body mass index is 43.78 kg/m.   Vital Signs:   Temp:  [97.8 F (36.6 C)-98.3 F (36.8 C)] 98.2 F (36.8 C) (08/18 0900) Pulse Rate:  [54-61] 55 (08/18 0900) Resp:  [12-18] 18 (08/18 0900) BP: (110-157)/(58-105) 157/105 (08/18 0900) SpO2:  [92 %-100 %] 100 % (08/18 0900) Last BM Date: 09/09/19  Weight change: Filed Weights   09/08/19 1426  Weight: 115.7 kg    Intake/Output:   Intake/Output Summary (Last 24 hours) at 09/11/2019 1204 Last data filed at 09/11/2019 0900 Gross per 24 hour  Intake 1076.67 ml  Output 1000 ml  Net 76.67 ml      Physical Exam    General:  Sitting in the chair. No resp difficulty HEENT: normal Neck: supple. JVP 9-10 . Carotids 2+ bilat; no bruits. No lymphadenopathy or thryomegaly appreciated. Cor: PMI nondisplaced. Regular rate & rhythm. No rubs, gallops or murmurs. Lungs: clear Abdomen: soft, nontender, nondistended. No hepatosplenomegaly. No bruits or masses. Good bowel sounds. Extremities: no cyanosis, clubbing, rash, R and LLE 1+ edema Neuro: alert & orientedx3, cranial nerves grossly intact. moves all 4 extremities w/o difficulty. Affect pleasant    Telemetry   SR 50-60s   EKG    None.   Labs    CBC Recent Labs    09/09/19 0216 09/10/19 1522  WBC 11.6*  --   NEUTROABS 9.1*  --   HGB 10.1* 8.3*  HCT 38.0* 32.5*  MCV 103.5*  --   PLT 165  --    Basic Metabolic Panel Recent Labs    09/09/19 0216 09/09/19 0216 09/09/19 2129 09/10/19 1522  NA 147*   < > 145 141  K 5.4*   < > 4.5 4.2  CL 88*   < > 91* 88*  CO2 >50*   < > 49* 46*  GLUCOSE 102*   < > 139* 234*  BUN 74*   < > 54* 45*  CREATININE 1.63*   < > 1.64* 1.72*  CALCIUM 8.8*   < > 8.6* 8.3*  MG 1.6*  --   --   --    < > = values in this  interval not displayed.   Liver Function Tests Recent Labs    09/09/19 0216  AST 22  ALT 12  ALKPHOS 60  BILITOT 1.7*  PROT 4.7*  ALBUMIN 2.4*   No results for input(s): LIPASE, AMYLASE in the last 72 hours. Cardiac Enzymes No results for input(s): CKTOTAL, CKMB, CKMBINDEX, TROPONINI in the last 72 hours.  BNP: BNP (last 3 results) Recent Labs    02/14/19 1522 06/20/19 1522 09/09/19 0219  BNP 437.7* 243.3* 214.9*    ProBNP (last 3 results) No results for input(s): PROBNP in the last 8760 hours.   D-Dimer No results for input(s): DDIMER in the last 72 hours. Hemoglobin A1C No results for input(s): HGBA1C in the last 72 hours. Fasting Lipid Panel No results for input(s): CHOL, HDL, LDLCALC, TRIG, CHOLHDL, LDLDIRECT in the last 72 hours. Thyroid Function Tests No results for input(s): TSH, T4TOTAL, T3FREE, THYROIDAB in the last 72 hours.  Invalid input(s): FREET3  Other results:   Imaging    No results found.   Medications:     Scheduled Medications: . sodium chloride  Intravenous Once  . amiodarone  200 mg Oral Daily  . atorvastatin  80 mg Oral q1800  . Chlorhexidine Gluconate Cloth  6 each Topical Daily  . [START ON 09/12/2019] pantoprazole  40 mg Oral Daily  . sodium chloride flush  10-40 mL Intracatheter Q12H    Infusions: . sodium chloride 10 mL/hr at 09/10/19 1126    PRN Medications: sodium chloride, acetaminophen **OR** acetaminophen, albuterol, ondansetron **OR** ondansetron (ZOFRAN) IV, polyethylene glycol, sodium chloride flush, sodium chloride flush     Assessment/Plan   1.GIB, upper Hematemesis/melena on admit. GI consulted.  EGD with lesion noted in antrum. Biopsy sent.  On PPI.  Ok per GI to start eliquis 09/11/19  2. PAF S/P DC-CV 07/15/19. Back in A fib RVR on admit.  Started on amio drip for A fib RVR. Now in Sinus Loletha Grayer. -Now on po amio 200 mg daily.  - As above can restart eliquis once biopsy back tomorrow.  - Hold  BB.   3. 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  RV failure is in the setting of OSA/OHS.  Volume status trending up. Give 40 mg torsemide daily.    4. OHS/OSA  On chronic 2 liters [place].  - Needs another sleep study but he cant afford the co pay.   4. H/O Stroke -On statin.  - Restarting eliquis hopefully tomorrow.   5. H/O PFO closure 2020   6. HTN  Add 12.5 mg hydralazine three times a day.      Length of Stay: 3  [name] Clegg, NP  09/11/2019, 12:04 PM  Advanced Heart Failure Team Pager [phone] (M-F; 7a - 4p)  Please contact [facility] for night-coverage after hours (4p -7a ) and weekends on [email or web address]  Patient seen and examined with the above-signed Advanced Practice Provider and/or Housestaff. I personally reviewed laboratory data, imaging studies and relevant notes. I independently examined the patient and formulated the important aspects of the plan. I have edited the note to reflect any of my changes or salient points. I have personally discussed the plan with the patient and/or family.  No further bleeding. Remains in NSR on po amio. Remains off Eliquis. Volume status ok .  General:  Sitting in chair  No resp difficulty HEENT: normal Neck: supple. JVP hard to see  Carotids 2+ bilat; no bruits. No lymphadenopathy or thryomegaly appreciated. Cor: PMI nondisplaced. Regular rate & rhythm. No rubs, gallops or murmurs. Lungs: clear Abdomen: obese soft, nontender, nondistended. No hepatosplenomegaly. No bruits or masses. Good bowel sounds. Extremities: no cyanosis, clubbing, rash, edema Neuro: alert & orientedx3, cranial nerves grossly intact. moves all 4 extremities w/o difficulty. Affect pleasant  Stable from cardiac standpoint, off pressors. Remains in NSR. Continue po amio. Restart Eliquis tomorrow. Await biopsy results. Ambulate.   Glori Bickers, MD  10:16  PM

## 2019-09-11 NOTE — Progress Notes (Signed)
RT Note:  Second attempt to place BIPAP.  Patient not ready at this time. RN to call for assistance if needed. Equipment set up at bedside.

## 2019-09-11 NOTE — Progress Notes (Signed)
Subjective: Patient is sitting comfortably on bedside chair. Reports not having bowel movement for several days now. Denies nausea, vomiting or abdominal pain. Was able to tolerate regular diet yesterday and today morning.  Objective: Vital signs in last 24 hours: Temp:  [97.6 F (36.4 C)-98.3 F (36.8 C)] 97.9 F (36.6 C) (08/18 0541) Pulse Rate:  [53-61] 54 (08/18 0541) Resp:  [12-18] 17 (08/18 0541) BP: (107-146)/(58-99) 110/58 (08/18 0541) SpO2:  [92 %-100 %] 99 % (08/18 0541) Weight change:  Last BM Date: 09/09/19  PE: Obese, prominent pallor, no icterus GENERAL: Not in distress, able to speak in full sentences, not using accessory muscles of respiration ABDOMEN: Soft, nondistended, nontender, normoactive bowel sounds EXTREMITIES: No deformity  Lab Results: Results for orders placed or performed during the hospital encounter of 09/07/19 (from the past 48 hour(s))  Basic metabolic panel     Status: Abnormal   Collection Time: 09/09/19  9:29 PM  Result Value Ref Range   Sodium 145 135 - 145 mmol/L   Potassium 4.5 3.5 - 5.1 mmol/L   Chloride 91 (L) 98 - 111 mmol/L   CO2 49 (H) 22 - 32 mmol/L   Glucose, Bld 139 (H) 70 - 99 mg/dL    Comment: Glucose reference range applies only to samples taken after fasting for at least 8 hours.   BUN 54 (H) 6 - 20 mg/dL   Creatinine, Ser 1.64 (H) 0.61 - 1.24 mg/dL   Calcium 8.6 (L) 8.9 - 10.3 mg/dL   GFR calc non Af Amer 53 (L) >60 mL/min   GFR calc Af Amer >60 >60 mL/min   Anion gap 5 5 - 15    Comment: Performed at [facility] [address], [place], [place] [address]  Hemoglobin and hematocrit, blood     Status: Abnormal   Collection Time: 09/10/19  3:22 PM  Result Value Ref Range   Hemoglobin 8.3 (L) 13.0 - 17.0 g/dL   HCT 32.5 (L) 39 - 52 %    Comment: Performed at [facility] [address], [place], [place] [address]  Basic metabolic panel     Status: Abnormal   Collection Time: 09/10/19  3:22 PM  Result  Value Ref Range   Sodium 141 135 - 145 mmol/L   Potassium 4.2 3.5 - 5.1 mmol/L   Chloride 88 (L) 98 - 111 mmol/L   CO2 46 (H) 22 - 32 mmol/L   Glucose, Bld 234 (H) 70 - 99 mg/dL    Comment: Glucose reference range applies only to samples taken after fasting for at least 8 hours.   BUN 45 (H) 6 - 20 mg/dL   Creatinine, Ser 1.72 (H) 0.61 - 1.24 mg/dL   Calcium 8.3 (L) 8.9 - 10.3 mg/dL   GFR calc non Af Amer 50 (L) >60 mL/min   GFR calc Af Amer 58 (L) >60 mL/min   Anion gap 7 5 - 15    Comment: Performed at [facility] [address] [place], [place] [address]  Glucose, capillary     Status: Abnormal   Collection Time: 09/10/19  4:48 PM  Result Value Ref Range   Glucose-Capillary 115 (H) 70 - 99 mg/dL    Comment: Glucose reference range applies only to samples taken after fasting for at least 8 hours.    Studies/Results: [place] EKG SITE RITE  Result Date: 09/09/2019 If Site Rite image not attached, placement could not be confirmed due to current cardiac rhythm.  Medications: I have reviewed the patient's current medications.  Assessment: Coffee-ground emesis and melena on presentation-EGD showed an antral growth: Thickened folds, minimal ulceration-biopsies taken, results pending BUN trending down, hemoglobin dropped from 10.1-8.3 without any obvious bleeding since endoscopy yesterday Has not required blood transfusion since admission, appears stable hemodynamically  Multiple comorbidities-paroxysmal atrial fibrillation, chronic diastolic heart failure, obstructive sleep apnea, history of PFO with closure in 2020  Plan: Awaiting biopsy results of antral mass/growth Continue pantoprazole once a day We will DC sucralfate Monitor H&H and transfuse if needed.  Ronnette Juniper, MD 09/11/2019, 8:58 AM

## 2019-09-11 NOTE — Plan of Care (Signed)
  Problem: Education: Goal: Knowledge of General Education information will improve Description Including pain rating scale, medication(s)/side effects and non-pharmacologic comfort measures Outcome: Progressing   

## 2019-09-11 NOTE — Plan of Care (Signed)

## 2019-09-12 ENCOUNTER — Encounter (HOSPITAL_COMMUNITY): Payer: Self-pay | Admitting: Gastroenterology

## 2019-09-12 LAB — CBC
HCT: 34.4 % — ABNORMAL LOW (ref 39.0–52.0)
Hemoglobin: 9.2 g/dL — ABNORMAL LOW (ref 13.0–17.0)
MCH: 28 pg (ref 26.0–34.0)
MCHC: 26.7 g/dL — ABNORMAL LOW (ref 30.0–36.0)
MCV: 104.9 fL — ABNORMAL HIGH (ref 80.0–100.0)
Platelets: 121 10*3/uL — ABNORMAL LOW (ref 150–400)
RBC: 3.28 MIL/uL — ABNORMAL LOW (ref 4.22–5.81)
RDW: 15.1 % (ref 11.5–15.5)
WBC: 5.7 10*3/uL (ref 4.0–10.5)
nRBC: 0 % (ref 0.0–0.2)

## 2019-09-12 LAB — BASIC METABOLIC PANEL
Anion gap: 4 — ABNORMAL LOW (ref 5–15)
BUN: 31 mg/dL — ABNORMAL HIGH (ref 6–20)
CO2: 47 mmol/L — ABNORMAL HIGH (ref 22–32)
Calcium: 8.4 mg/dL — ABNORMAL LOW (ref 8.9–10.3)
Chloride: 85 mmol/L — ABNORMAL LOW (ref 98–111)
Creatinine, Ser: 2.02 mg/dL — ABNORMAL HIGH (ref 0.61–1.24)
GFR calc Af Amer: 48 mL/min — ABNORMAL LOW (ref >60)
GFR calc non Af Amer: 41 mL/min — ABNORMAL LOW (ref >60)
Glucose, Bld: 121 mg/dL — ABNORMAL HIGH (ref 70–99)
Potassium: 4 mmol/L (ref 3.5–5.1)
Sodium: 136 mmol/L (ref 135–145)

## 2019-09-12 LAB — TYPE AND SCREEN
ABO/RH(D): O POS
Antibody Screen: NEGATIVE
Unit division: 0

## 2019-09-12 LAB — BPAM RBC
Blood Product Expiration Date: 202109162359
Unit Type and Rh: 5100

## 2019-09-12 LAB — SURGICAL PATHOLOGY

## 2019-09-12 MED ORDER — APIXABAN 5 MG PO TABS
5.0000 mg | ORAL_TABLET | Freq: Two times a day (BID) | ORAL | Status: DC
  Administered 2019-09-12 – 2019-09-13 (×3): 5 mg via ORAL
  Filled 2019-09-12 (×3): qty 1

## 2019-09-12 NOTE — Plan of Care (Signed)
  Problem: Activity: Goal: Risk for activity intolerance will decrease Outcome: Progressing   

## 2019-09-12 NOTE — Progress Notes (Signed)
PROGRESS NOTE    Douglas [name].  LKG:[id number] DOB: 1985/02/01 DOA: 09/07/2019 PCP: Patient, No Pcp Per  Brief Narrative: 34 year old chronically ill male with chronic diastolic CHF/right heart failure, paroxysmal A. fib, PFO and CVA in 2020 on Eliquis, sleep apnea, hypertension was admitted to the ICU with hypotension, SVT, ongoing melena and coffee-ground emesis. -Admitted to the ICU, treated with Neo-Synephrine and IV amiodarone -Gastroenterology and cardiology following, -Underwent endoscopy 8/27 which showed a large fungating polypoid lesion in the gastric antrum  Assessment & Plan:   Gastric mass Acute blood loss anemia -Follow-up biopsy results, continue PPI changed to p.o. -some black stools overnight but Hb is stable, likely old blood -resume anticoagulation today -awaiting biopsy results  Hypotension -Likely hypovolemic shock in the setting of upper GI bleed exacerbated by A. fib RVR -Improved, off Neo-Synephrine -Treated with IV amiodarone as well, now p.o. -Cardiology following -BP stable  Chronic diastolic CHF Right heart failure/pulmonary hypertension -Required fluid resuscitation in the ICU, diuretics were held initially -Restarted torsemide yesterday but creatinine trended up, holding today -CHF team following, BMP in am  History of paroxysmal A. Fib History of CVA -Underwent DC cardioversion recently on 6/21 -IV amiodarone changed to p.o -Continue statin, Eliquis resumed today  AKi on CKD3a -baseline creatinine 1.5-1.6, now up to 2, holding diuretics today, monitor -BMP in am  OSA/OHS Obesity, BMI 43.7 Chronic respiratory failure -Continue NIPPV at night -On 2 L home O2 at baseline -Needs another sleep study -Weight loss, lifestyle modification recommended  DVT prophylaxis: SCDs Code Status: Full code Family Communication: Discussed patient in detail, no family at bedside Disposition Plan:  Status is: Inpatient  Remains inpatient  appropriate because:Inpatient level of care appropriate due to severity of illness   Dispo: The patient is from: Home              Anticipated d/c is to: Home              Anticipated d/c date is: possibly tomorrow              Patient currently is not medically stable to d/c.  Consultants:   Cards, PCCM Tx, [facility]    Procedures: EGD 8/17 noted widely patent Schatzki ring, small hiatal hernia and large fungating polypoid lesion in the gastric antrum  Antimicrobials:    Subjective: -Feels okay, some black stools overnight, breathing ok  Objective: Vitals:   09/11/19 1633 09/11/19 2113 09/12/19 0508 09/12/19 0833  BP: (!) 165/108 128/71 125/71 (!) 153/101  Pulse: (!) 55 (!) 57 (!) 51 (!) 59  Resp: 18 19 19 12   Temp: 98.5 F (36.9 C) 98.2 F (36.8 C) 98.4 F (36.9 C) 98.9 F (37.2 C)  TempSrc: Oral   Oral  SpO2: 100% 98% 100% 98%  Weight:      Height:        Intake/Output Summary (Last 24 hours) at 09/12/2019 1433 Last data filed at 09/12/2019 6644 Gross per 24 hour  Intake 620 ml  Output 0 ml  Net 620 ml   Filed Weights   09/08/19 1426  Weight: 115.7 kg    Examination:  General exam: obese, pleasant male, sitting in bed, AAOx3, no distress CVS: S1S2/RRR Lungs: decreased at bases otherwise clear Abd: soft, NT, BS present Ext: trace edema Skin: No rashes on exposed skin  psychiatry: Flat affect    Data Reviewed:   CBC: Recent Labs  Lab 09/07/19 1615 09/07/19 1615 09/08/19 0434 09/08/19 1057 09/09/19 0216 09/10/19  1522 09/12/19 0450  WBC 11.8*  --   --  15.5* 11.6*  --  5.7  NEUTROABS  --   --   --   --  9.1*  --   --   HGB 12.5*   < > 11.2* 11.4* 10.1* 8.3* 9.2*  HCT 46.9   < > 42.3 42.9 38.0* 32.5* 34.4*  MCV 102.6*  --   --  103.9* 103.5*  --  104.9*  PLT 171  --   --  172 165  --  121*   < > = values in this interval not displayed.   Basic Metabolic Panel: Recent Labs  Lab 09/07/19 1615 09/07/19 1615 09/08/19 0434 09/08/19 0435  09/09/19 0216 09/09/19 2129 09/10/19 1522 09/12/19 0450  NA 139  --   --   --  147* 145 141 136  K 6.1*   < > 4.8  --  5.4* 4.5 4.2 4.0  CL 84*  --   --   --  88* 91* 88* 85*  CO2 46*  --   --   --  >50* 49* 46* 47*  GLUCOSE 103*   < >  --  174* 102* 139* 234* 121*  BUN 86*  --   --   --  74* 54* 45* 31*  CREATININE 1.92*  --   --   --  1.63* 1.64* 1.72* 2.02*  CALCIUM 8.6*  --   --   --  8.8* 8.6* 8.3* 8.4*  MG  --   --   --   --  1.6*  --   --   --    < > = values in this interval not displayed.   GFR: Estimated Creatinine Clearance: 58.5 mL/min (A) (by C-G formula based on SCr of 2.02 mg/dL (H)). Liver Function Tests: Recent Labs  Lab 09/07/19 1615 09/09/19 0216  AST 23 22  ALT 19 12  ALKPHOS 77 60  BILITOT 2.0* 1.7*  PROT 5.3* 4.7*  ALBUMIN 2.6* 2.4*   Recent Labs  Lab 09/07/19 1615  LIPASE 21   No results for input(s): AMMONIA in the last 168 hours. Coagulation Profile: Recent Labs  Lab 09/08/19 0434  INR 1.3*   Cardiac Enzymes: No results for input(s): CKTOTAL, CKMB, CKMBINDEX, TROPONINI in the last 168 hours. BNP (last 3 results) No results for input(s): PROBNP in the last 8760 hours. HbA1C: No results for input(s): HGBA1C in the last 72 hours. CBG: Recent Labs  Lab 09/08/19 0659 09/08/19 1252 09/10/19 1648  GLUCAP 84 80 115*   Lipid Profile: No results for input(s): CHOL, HDL, LDLCALC, TRIG, CHOLHDL, LDLDIRECT in the last 72 hours. Thyroid Function Tests: No results for input(s): TSH, T4TOTAL, FREET4, T3FREE, THYROIDAB in the last 72 hours. Anemia Panel: No results for input(s): VITAMINB12, FOLATE, FERRITIN, TIBC, IRON, RETICCTPCT in the last 72 hours. Urine analysis:    Component Value Date/Time   COLORURINE STRAW (A) 09/07/2019 2119   APPEARANCEUR CLEAR 09/07/2019 2119   LABSPEC 1.009 09/07/2019 2119   PHURINE 7.0 09/07/2019 2119   GLUCOSEU NEGATIVE 09/07/2019 2119   HGBUR NEGATIVE 09/07/2019 2119   BILIRUBINUR NEGATIVE 09/07/2019 2119    [place] NEGATIVE 09/07/2019 2119   PROTEINUR NEGATIVE 09/07/2019 2119   UROBILINOGEN 1.0 06/09/2014 1527   NITRITE NEGATIVE 09/07/2019 2119   LEUKOCYTESUR NEGATIVE 09/07/2019 2119   Sepsis Labs: @LABRCNTIP (procalcitonin:4,lacticidven:4)  ) Recent Results (from the past 240 hour(s))  SARS Coronavirus 2 by RT PCR (hospital order, performed in [facility] hospital  lab) Nasopharyngeal Nasopharyngeal Swab     Status: None   Collection Time: 09/07/19 11:49 PM   Specimen: Nasopharyngeal Swab  Result Value Ref Range Status   SARS Coronavirus 2 NEGATIVE NEGATIVE Final    Comment: (NOTE) SARS-CoV-2 target nucleic acids are NOT DETECTED.  The SARS-CoV-2 RNA is generally detectable in upper and lower respiratory specimens during the acute phase of infection. The lowest concentration of SARS-CoV-2 viral copies this assay can detect is 250 copies / mL. A negative result does not preclude SARS-CoV-2 infection and should not be used as the sole basis for treatment or other patient management decisions.  A negative result may occur with improper specimen collection / handling, submission of specimen other than nasopharyngeal swab, presence of viral mutation(s) within the areas targeted by this assay, and inadequate number of viral copies (<250 copies / mL). A negative result must be combined with clinical observations, patient history, and epidemiological information.  Fact Sheet for Patients:   StrictlyIdeas.no  Fact Sheet for Healthcare Providers: BankingDealers.co.za  This test is not yet approved or  cleared by the [place] FDA and has been authorized for detection and/or diagnosis of SARS-CoV-2 by FDA under an Emergency Use Authorization (EUA).  This EUA will remain in effect (meaning this test can be used) for the duration of the COVID-19 declaration under Section 564(b)(1) of the Act, 21 U.S.C. section 360bbb-3(b)(1), unless the  authorization is terminated or revoked sooner.  Performed at [facility], [place] [address] [place], [place] [address]   MRSA PCR Screening     Status: None   Collection Time: 09/09/19  2:53 AM   Specimen: Nasopharyngeal  Result Value Ref Range Status   MRSA by PCR NEGATIVE NEGATIVE Final    Comment:        The GeneXpert MRSA Assay (FDA approved for NASAL specimens only), is one component of a comprehensive MRSA colonization surveillance program. It is not intended to diagnose MRSA infection nor to guide or monitor treatment for MRSA infections. Performed at [facility], [place] [address], [place], [place] [address]          Radiology Studies: No results found.      Scheduled Meds: . sodium chloride   Intravenous Once  . amiodarone  200 mg Oral Daily  . apixaban  5 mg Oral BID  . atorvastatin  80 mg Oral q1800  . carvedilol  3.125 mg Oral BID WC  . Chlorhexidine Gluconate Cloth  6 each Topical Daily  . hydrALAZINE  12.5 mg Oral Q8H  . pantoprazole  40 mg Oral Daily  . sodium chloride flush  10-40 mL Intracatheter Q12H   Continuous Infusions: . sodium chloride 10 mL/hr at 09/10/19 1126     LOS: 4 days    Time spent: 18min  Domenic Polite, MD [facility]  09/12/2019, 2:33 PM

## 2019-09-12 NOTE — Progress Notes (Addendum)
Advanced Heart Failure Rounding Note  PCP-Cardiologist: No primary care provider on file.   Subjective:    S/P EGD- lesion noted antrum. Biopsy obtained.   He notices a small amount of melena yesterday but Hgb trending up, 8.3>>9.2.   Denies dyspnea. On 2L [place] which is his home baseline. NSR currently in the 60s.   SCr trending up, 1.64>>1.72>>2.02. Daily wts not ordered    Objective:   Weight Range: 115.7 kg Body mass index is 43.78 kg/m.   Vital Signs:   Temp:  [98.2 F (36.8 C)-98.5 F (36.9 C)] 98.4 F (36.9 C) (08/19 0508) Pulse Rate:  [51-57] 51 (08/19 0508) Resp:  [18-19] 19 (08/19 0508) BP: (125-165)/(71-108) 125/71 (08/19 0508) SpO2:  [98 %-100 %] 100 % (08/19 0508) Last BM Date: 09/11/19  Weight change: Filed Weights   09/08/19 1426  Weight: 115.7 kg    Intake/Output:   Intake/Output Summary (Last 24 hours) at 09/12/2019 0756 Last data filed at 09/12/2019 0659 Gross per 24 hour  Intake 1220 ml  Output 250 ml  Net 970 ml      Physical Exam    PHYSICAL EXAM: General:  Moderately obese, well appearing.  Sitting up in chair. No respiratory difficulty HEENT: normal Neck: supple. JVD not well visualized. Carotids 2+ bilat; no bruits. No lymphadenopathy or thyromegaly appreciated. Cor: PMI nondisplaced. Regular rate & rhythm. No rubs, gallops or murmurs. Lungs: clear Abdomen: obese, soft, nontender, nondistended. No hepatosplenomegaly. No bruits or masses. Good bowel sounds. Extremities: no cyanosis, clubbing, rash, edema Neuro: alert & oriented x 3, cranial nerves grossly intact. moves all 4 extremities w/o difficulty. Affect pleasant.   Telemetry   NSR low 60s, brief episode of bigeminy   EKG    No new EKG to review   Labs    CBC Recent Labs    09/10/19 1522 09/12/19 0450  WBC  --  5.7  HGB 8.3* 9.2*  HCT 32.5* 34.4*  MCV  --  104.9*  PLT  --  601*   Basic Metabolic Panel Recent Labs    09/10/19 1522 09/12/19 0450  NA 141  136  K 4.2 4.0  CL 88* 85*  CO2 46* 47*  GLUCOSE 234* 121*  BUN 45* 31*  CREATININE 1.72* 2.02*  CALCIUM 8.3* 8.4*   Liver Function Tests No results for input(s): AST, ALT, ALKPHOS, BILITOT, PROT, ALBUMIN in the last 72 hours. No results for input(s): LIPASE, AMYLASE in the last 72 hours. Cardiac Enzymes No results for input(s): CKTOTAL, CKMB, CKMBINDEX, TROPONINI in the last 72 hours.  BNP: BNP (last 3 results) Recent Labs    02/14/19 1522 06/20/19 1522 09/09/19 0219  BNP 437.7* 243.3* 214.9*    ProBNP (last 3 results) No results for input(s): PROBNP in the last 8760 hours.   D-Dimer No results for input(s): DDIMER in the last 72 hours. Hemoglobin A1C No results for input(s): HGBA1C in the last 72 hours. Fasting Lipid Panel No results for input(s): CHOL, HDL, LDLCALC, TRIG, CHOLHDL, LDLDIRECT in the last 72 hours. Thyroid Function Tests No results for input(s): TSH, T4TOTAL, T3FREE, THYROIDAB in the last 72 hours.  Invalid input(s): FREET3  Other results:   Imaging    No results found.   Medications:     Scheduled Medications: . sodium chloride   Intravenous Once  . amiodarone  200 mg Oral Daily  . atorvastatin  80 mg Oral q1800  . carvedilol  3.125 mg Oral BID WC  . Chlorhexidine Gluconate  Cloth  6 each Topical Daily  . hydrALAZINE  12.5 mg Oral Q8H  . pantoprazole  40 mg Oral Daily  . sodium chloride flush  10-40 mL Intracatheter Q12H  . torsemide  40 mg Oral Daily    Infusions: . sodium chloride 10 mL/hr at 09/10/19 1126    PRN Medications: sodium chloride, acetaminophen **OR** acetaminophen, albuterol, ondansetron **OR** ondansetron (ZOFRAN) IV, polyethylene glycol, sodium chloride flush, sodium chloride flush     Assessment/Plan   1.GIB, upper - Hematemesis/melena on admit. GI consulted.  - EGD with lesion noted in antrum. Biopsy sent.  - On PPI.  - Hgb trending up, 8.3>>9.2 today - Ok per GI to restart eliquis, 5 mg bid - follow  CBC    2. PAF - S/P DC-CV 07/15/19. Back in A fib RVR on admit.  - Started on amio drip for A fib RVR. Now in Sinus Loletha Grayer. - Now on po amio 200 mg daily + coreg 3.125 bid  - Restart Eliquis 5 mg bid     3. Chronic Diastolic HF--> RV Failure - TEE in 9/20 showed EF 55%, severe RV dilation with moderately decreased systolic function, - D-shaped septum.  RV failure is in the setting of OSA/OHS.  - volume status ok. Hold torsemide w/ AKI  - will order daily wts. Repeat BNP in am   4. OHS/OSA  - On chronic 2 liters [place].  - Needs another sleep study but he cant afford the co pay.   4. H/O Stroke - On statin.  - Restart Eliquis today   5. H/O PFO closure 2020   6. HTN  - controlled on current regimen  - continue coreg 3.125 bid - continue 12.5 mg hydralazine tid.   7. AKI - SCr trending up, 1.64>>1.72>>2.02  - volume status appears ok. Hold torsemide today. Follow BMP    Length of Stay: [address], PA-C  09/12/2019, 7:56 AM  Advanced Heart Failure Team Pager [phone] (M-F; 7a - 4p)  Please contact [facility] for night-coverage after hours (4p -7a ) and weekends on [email or web address]   Patient seen and examined with the above-signed Advanced Practice Provider and/or Housestaff. I personally reviewed laboratory data, imaging studies and relevant notes. I independently examined the patient and formulated the important aspects of the plan. I have edited the note to reflect any of my changes or salient points. I have personally discussed the plan with the patient and/or family.  Remains in NSR. Feels good. Biopsy of antral mass shows hyperplastic polyp. No malignancy.   General:  Obese male sitting up in bed  No resp difficulty HEENT: normal Neck: supple. no JVD. Carotids 2+ bilat; no bruits. No lymphadenopathy or thryomegaly appreciated. Cor: PMI nondisplaced. Regular rate & rhythm. No rubs, gallops or murmurs. Lungs: clear Abdomen: obese soft, nontender, nondistended.  No hepatosplenomegaly. No bruits or masses. Good bowel sounds. Extremities: no cyanosis, clubbing, rash, edema Neuro: alert & orientedx3, cranial nerves grossly intact. moves all 4 extremities w/o difficulty. Affect pleasant  Looks good. Antral biopsy negative. Creatinine up a bit today. Agree with holding torsemide. Maintaining NSR. Continue po amio. Restart Eliquis.   Possibly home in am.   Glori Bickers, MD  4:04 PM

## 2019-09-12 NOTE — Progress Notes (Signed)
Biopsy from antral mass reported as inflamed hyperplastic gastric polyp, no evidence of H. pylori or intestinal metaplasia.  Discussed findings with patient's hospitalist Dr. Broadus John.  As patient is going to require Eliquis, recommend pantoprazole or a PPI once a day indefinitely.  Polyp is very broad-based and will not be amenable for resection but has increased risk of bleeding.  Ronnette Juniper, MD

## 2019-09-12 NOTE — Discharge Instructions (Signed)

## 2019-09-12 NOTE — Progress Notes (Signed)
Pt refuse QHS bipap at this time. RT to monitor as needed

## 2019-09-12 NOTE — Progress Notes (Signed)
Pt. Stated he would notify when ready for his cpap.

## 2019-09-12 NOTE — Plan of Care (Signed)
  Problem: Education: Goal: Knowledge of General Education information will improve Description Including pain rating scale, medication(s)/side effects and non-pharmacologic comfort measures Outcome: Progressing   

## 2019-09-13 LAB — BASIC METABOLIC PANEL
Anion gap: 5 (ref 5–15)
BUN: 22 mg/dL — ABNORMAL HIGH (ref 6–20)
CO2: 42 mmol/L — ABNORMAL HIGH (ref 22–32)
Calcium: 8.5 mg/dL — ABNORMAL LOW (ref 8.9–10.3)
Chloride: 86 mmol/L — ABNORMAL LOW (ref 98–111)
Creatinine, Ser: 1.58 mg/dL — ABNORMAL HIGH (ref 0.61–1.24)
GFR calc Af Amer: >60 mL/min (ref >60)
GFR calc non Af Amer: 55 mL/min — ABNORMAL LOW (ref >60)
Glucose, Bld: 81 mg/dL (ref 70–99)
Potassium: 4.6 mmol/L (ref 3.5–5.1)
Sodium: 133 mmol/L — ABNORMAL LOW (ref 135–145)

## 2019-09-13 LAB — CBC
HCT: 35.2 % — ABNORMAL LOW (ref 39.0–52.0)
Hemoglobin: 9.4 g/dL — ABNORMAL LOW (ref 13.0–17.0)
MCH: 27.4 pg (ref 26.0–34.0)
MCHC: 26.7 g/dL — ABNORMAL LOW (ref 30.0–36.0)
MCV: 102.6 fL — ABNORMAL HIGH (ref 80.0–100.0)
Platelets: 125 10*3/uL — ABNORMAL LOW (ref 150–400)
RBC: 3.43 MIL/uL — ABNORMAL LOW (ref 4.22–5.81)
RDW: 15 % (ref 11.5–15.5)
WBC: 5.5 10*3/uL (ref 4.0–10.5)
nRBC: 0 % (ref 0.0–0.2)

## 2019-09-13 LAB — BRAIN NATRIURETIC PEPTIDE: B Natriuretic Peptide: 366 pg/mL — ABNORMAL HIGH (ref 0.0–100.0)

## 2019-09-13 LAB — MAGNESIUM: Magnesium: 2 mg/dL (ref 1.7–2.4)

## 2019-09-13 MED ORDER — CARVEDILOL 3.125 MG PO TABS: 3.1250 mg | ORAL_TABLET | Freq: Two times a day (BID) | ORAL | 0 refills | Status: DC

## 2019-09-13 MED ORDER — HYDRALAZINE HCL 25 MG PO TABS: 12.5000 mg | ORAL_TABLET | Freq: Three times a day (TID) | ORAL | Status: DC

## 2019-09-13 MED ORDER — TORSEMIDE 20 MG PO TABS
60.0000 mg | ORAL_TABLET | Freq: Every day | ORAL | Status: DC
  Filled 2019-09-13: qty 3

## 2019-09-13 MED ORDER — TORSEMIDE 20 MG PO TABS: ORAL_TABLET | ORAL | Status: DC

## 2019-09-13 MED ORDER — PANTOPRAZOLE SODIUM 40 MG PO TBEC: 40.0000 mg | DELAYED_RELEASE_TABLET | Freq: Every day | ORAL | 3 refills | Status: AC

## 2019-09-13 MED ORDER — TORSEMIDE 20 MG PO TABS
80.0000 mg | ORAL_TABLET | Freq: Every day | ORAL | Status: DC
  Administered 2019-09-13: 80 mg via ORAL
  Filled 2019-09-13: qty 4

## 2019-09-13 MED ORDER — AMIODARONE HCL 200 MG PO TABS: 200.0000 mg | ORAL_TABLET | Freq: Every day | ORAL | 0 refills | Status: AC

## 2019-09-13 NOTE — Discharge Summary (Signed)
Physician Discharge Summary  Douglas [name]. QHU:[id number] DOB: 08-31-1985 DOA: 09/07/2019  PCP: Medicine, [facility]  Admit date: 09/07/2019 Discharge date: 09/13/2019  Time spent: 35 minutes  Recommendations for Outpatient Follow-up:  Gastroenterology Dr. Therisa Doyne in 1 month, needs repeat endoscopy Dr. Ardyth Man. Aundra Dubin at heart failure clinic in 7 to 10 days with repeat labs  Discharge Diagnoses:  Principal Problem:   Acute blood loss anemia Melena, gastric hyperplastic polyp Biventricular heart failure/right heart failure Paroxysmal atrial fibrillation History of CVA   OSA treated with BiPAP   Essential hypertension   Acute hyperkalemia   Biventricular congestive heart failure (HCC)   Chronic respiratory failure with hypoxia (HCC)   Pulmonary hypertension with chronic cor pulmonale ([place])   Melena   Discharge Condition: Stable  Diet recommendation: Low-sodium, heart healthy Filed Weights   09/08/19 1426 09/13/19 1038  Weight: 115.7 kg 125.8 kg    History of present illness:  34 year old chronically ill male with chronic diastolic CHF/right heart failure, paroxysmal A. fib, PFO and CVA in 2020 on Eliquis, sleep apnea, hypertension was admitted to the ICU with hypotension, SVT, ongoing melena and coffee-ground emesis. -Admitted to the ICU, treated with Neo-Synephrine and IV amiodarone  Hospital Course:   Gastric mass Acute blood loss anemia -Gastroenterology consulted, underwent endoscopy by Dr. Acie Fredrickson gastroenterology this noted and large inflamed gastric polyp, biopsy consistent with inflamed hyperplastic polyp, no evidence of metaplasia, no H. Pylori -Advised compliance with Protonix daily, may need this indefinitely -Educated patient and sister about the need for follow-up with Dr.Karki GI for repeat endoscopy -No active bleeding at this time, Eliquis resumed at discharge given history of A. fib and stroke however as the polyp is  broad-based it does have increased risk of bleeding, recheck CBC in 1 week  Hypotension Hypovolemic shock -Likely hypovolemic shock in the setting of upper GI bleed exacerbated by A. fib RVR, diuretics -Improved, required Neo-Synephrine in the ICU -Treated with IV amiodarone as well, now p.o. -Cardiology following -BP stable, Coreg and hydralazine resumed at lower dose -Blood pressure stable  Chronic diastolic CHF Right heart failure/pulmonary hypertension -Required fluid resuscitation in the ICU, diuretics were held initially -Restarted torsemide at discharge, also resumed Coreg and hydralazine at a lower dose -Follow-up in the CHF clinic with Dr. Ardyth Man. Aundra Dubin  History of paroxysmal A. Fib History of CVA -Underwent DC cardioversion recently on 6/21 -IV amiodarone changed to p.o -Continue statin, Eliquis resumed yesterday -continue amiodarone at DC, FU with Cards next week  AKi on CKD3a -baseline creatinine 1.5-1.6, now up to 2, helding diuretics yesterday, back to baseline now, resumed torsemide at DC< repeat BMP in 1 week  OSA/OHS Obesity, BMI 43.7 Chronic respiratory failure -Continue NIPPV at night -On 2 L home O2 at baseline -Needs another sleep study -Weight loss, lifestyle modification recommended   Discharge Exam: Vitals:   09/13/19 0513 09/13/19 1000  BP: (!) 158/96 102/62  Pulse: (!) 56 (!) 59  Resp: 18 16  Temp: 98.7 F (37.1 C) 98.1 F (36.7 C)  SpO2: 100% 100%    General: AAOx3 Cardiovascular: S1s2/RRR Respiratory: CTAB  Discharge Instructions   Discharge Instructions    Diet - low sodium heart healthy   Complete by: As directed    Increase activity slowly   Complete by: As directed      Allergies as of 09/13/2019      Reactions   Lisinopril Cough      Medication List    STOP taking these  medications   sildenafil 50 MG tablet Commonly known as: VIAGRA     TAKE these medications   albuterol 108 (90 Base) MCG/ACT  inhaler Commonly known as: Proventil HFA Inhale 1-2 puffs into the lungs every 6 (six) hours as needed for wheezing or shortness of breath.   amiodarone 200 MG tablet Commonly known as: PACERONE Take 1 tablet (200 mg total) by mouth daily. Start taking on: September 14, 2019   atorvastatin 80 MG tablet Commonly known as: LIPITOR Take 1 tablet (80 mg total) by mouth daily at 6 PM.   carvedilol 3.125 MG tablet Commonly known as: COREG Take 1 tablet (3.125 mg total) by mouth 2 (two) times daily with a meal. What changed:   medication strength  how much to take   Eliquis 5 MG Tabs tablet Generic drug: apixaban TAKE 1 TABLET (5 MG TOTAL) BY MOUTH 2 (TWO) TIMES DAILY. What changed: See the new instructions.   hydrALAZINE 25 MG tablet Commonly known as: APRESOLINE Take 0.5 tablets (12.5 mg total) by mouth 3 (three) times daily. What changed: how much to take   OXYGEN Inhale 2 L/min into the lungs continuous.   pantoprazole 40 MG tablet Commonly known as: PROTONIX Take 1 tablet (40 mg total) by mouth daily. Start taking on: September 14, 2019   torsemide 20 MG tablet Commonly known as: DEMADEX Take 4 tablets (80 mg total) by mouth in the morning AND 2 tablets (40 mg total) every evening. What changed: See the new instructions.      Allergies  Allergen Reactions  . Lisinopril Cough    Follow-up Information    [facility] Follow up on 09/16/2019.   Specialty: Family Medicine Why: You have a telehealth appointment at [facility] on 09/16/2019 Contact information: [place] [address] [phone]        [place] [facility] Follow up on 09/17/2019.   Specialty: Cardiology Why: 12:00 PM  Advanced Heart Failure Clinic  Parking Garage Code [phone]  Contact information: [address] [id number] [place] Follow up.   Why: for  questions about oxygen, call [place] [place] information: [place] [address] [phone]        Ronnette Juniper, MD. Schedule an appointment as soon as possible for a visit in 1 month(s).   Specialty: Gastroenterology Contact information: [place]  [address] [phone]                The results of significant diagnostics from this hospitalization (including imaging, microbiology, ancillary and laboratory) are listed below for reference.    Significant Diagnostic Studies: DG Chest 2 View  Result Date: 09/07/2019 CLINICAL DATA:  Patient coughing up blood. EXAM: CHEST - 2 VIEW COMPARISON:  Chest radiograph October 09, 2018. FINDINGS: Marked cardiomegaly. Masslike structure right hilum most compatible with enlarged pulmonary artery. No large area of pulmonary consolidation. No pleural effusion or pneumothorax. Osseous structures unremarkable. IMPRESSION: 1. Cardiomegaly. 2. Enlarged pulmonary artery. Electronically Signed   By: Lovey Newcomer M.D.   On: 09/07/2019 16:56   DG CHEST PORT 1 VIEW  Result Date: 09/09/2019 CLINICAL DATA:  Shortness of breath. EXAM: PORTABLE CHEST 1 VIEW COMPARISON:  Radiograph 2 days ago.  Coronary CT 10/16/2018 FINDINGS: Stable cardiomegaly. Stable prominence of the central pulmonary arteries. Vascular congestion without pulmonary edema. Subsegmental atelectasis or scarring in the  left mid lung. No confluent airspace disease. No pleural fluid or pneumothorax. Stable osseous structures. Degenerative change in the right shoulder without ossified intra-articular body. IMPRESSION: 1. Stable cardiomegaly. 2. Vascular congestion. Electronically Signed   By: Keith Rake M.D.   On: 09/09/2019 02:11   [place] EKG SITE RITE  Result Date: 09/09/2019 If Site Rite image not attached, placement could not be confirmed due to current cardiac rhythm.   Microbiology: Recent Results (from the past 240 hour(s))  SARS  Coronavirus 2 by RT PCR (hospital order, performed in [facility] hospital lab) Nasopharyngeal Nasopharyngeal Swab     Status: None   Collection Time: 09/07/19 11:49 PM   Specimen: Nasopharyngeal Swab  Result Value Ref Range Status   SARS Coronavirus 2 NEGATIVE NEGATIVE Final    Comment: (NOTE) SARS-CoV-2 target nucleic acids are NOT DETECTED.  The SARS-CoV-2 RNA is generally detectable in upper and lower respiratory specimens during the acute phase of infection. The lowest concentration of SARS-CoV-2 viral copies this assay can detect is 250 copies / mL. A negative result does not preclude SARS-CoV-2 infection and should not be used as the sole basis for treatment or other patient management decisions.  A negative result may occur with improper specimen collection / handling, submission of specimen other than nasopharyngeal swab, presence of viral mutation(s) within the areas targeted by this assay, and inadequate number of viral copies (<250 copies / mL). A negative result must be combined with clinical observations, patient history, and epidemiological information.  Fact Sheet for Patients:   StrictlyIdeas.no  Fact Sheet for Healthcare Providers: BankingDealers.co.za  This test is not yet approved or  cleared by the [place] FDA and has been authorized for detection and/or diagnosis of SARS-CoV-2 by FDA under an Emergency Use Authorization (EUA).  This EUA will remain in effect (meaning this test can be used) for the duration of the COVID-19 declaration under Section 564(b)(1) of the Act, 21 U.S.C. section 360bbb-3(b)(1), unless the authorization is terminated or revoked sooner.  Performed at [facility], [place] [address] [place], [place] [address]   MRSA PCR Screening     Status: None   Collection Time: 09/09/19  2:53 AM   Specimen: Nasopharyngeal  Result Value Ref Range Status   MRSA by PCR NEGATIVE NEGATIVE Final     Comment:        The GeneXpert MRSA Assay (FDA approved for NASAL specimens only), is one component of a comprehensive MRSA colonization surveillance program. It is not intended to diagnose MRSA infection nor to guide or monitor treatment for MRSA infections. Performed at [facility], [place] [address], [place], [place] [address]      Labs: Basic Metabolic Panel: Recent Labs  Lab 09/09/19 0216 09/09/19 2129 09/10/19 1522 09/12/19 0450 09/13/19 0400  NA 147* 145 141 136 133*  K 5.4* 4.5 4.2 4.0 4.6  CL 88* 91* 88* 85* 86*  CO2 >50* 49* 46* 47* 42*  GLUCOSE 102* 139* 234* 121* 81  BUN 74* 54* 45* 31* 22*  CREATININE 1.63* 1.64* 1.72* 2.02* 1.58*  CALCIUM 8.8* 8.6* 8.3* 8.4* 8.5*  MG 1.6*  --   --   --  2.0   Liver Function Tests: Recent Labs  Lab 09/07/19 1615 09/09/19 0216  AST 23 22  ALT 19 12  ALKPHOS 77 60  BILITOT 2.0* 1.7*  PROT 5.3* 4.7*  ALBUMIN 2.6* 2.4*   Recent Labs  Lab 09/07/19 1615  LIPASE 21   No  results for input(s): AMMONIA in the last 168 hours. CBC: Recent Labs  Lab 09/07/19 1615 09/08/19 0434 09/08/19 1057 09/09/19 0216 09/10/19 1522 09/12/19 0450 09/13/19 0400  WBC 11.8*  --  15.5* 11.6*  --  5.7 5.5  NEUTROABS  --   --   --  9.1*  --   --   --   HGB 12.5*   < > 11.4* 10.1* 8.3* 9.2* 9.4*  HCT 46.9   < > 42.9 38.0* 32.5* 34.4* 35.2*  MCV 102.6*  --  103.9* 103.5*  --  104.9* 102.6*  PLT 171  --  172 165  --  121* 125*   < > = values in this interval not displayed.   Cardiac Enzymes: No results for input(s): CKTOTAL, CKMB, CKMBINDEX, TROPONINI in the last 168 hours. BNP: BNP (last 3 results) Recent Labs    06/20/19 1522 09/09/19 0219 09/13/19 0450  BNP 243.3* 214.9* 366.0*    ProBNP (last 3 results) No results for input(s): PROBNP in the last 8760 hours.  CBG: Recent Labs  Lab 09/08/19 0659 09/08/19 1252 09/10/19 1648  GLUCAP 84 80 115*   Signed:  Domenic Polite MD.  [facility] 09/13/2019,  3:02 PM

## 2019-09-13 NOTE — TOC Progression Note (Signed)
Transition of Care (TOC) - Progression Note    Patient Details  Name: Douglas [name]. MRN: [id number] Date of Birth: 1985-07-21  Transition of Care [facility]) CM/SW Contact  Bartholomew Crews, RN Phone Number: [phone] 09/13/2019, 11:13 AM  Clinical Narrative:     Spoke with patient at the bedside. PTA home alone. States that his stepdaughter, Worthy Keeler, helps him as needed. He gives permission for NCM to speak with [place].   Verified address and phone number in Epic as correct. Patient states that he does not have a PCP and has not received any information from Medicaid. NCM assisted patient with calling [facility] Medicaid - Address and phone number updated. Patient has been preassigned to PCP - [facility]. Telehealth appointment made for 8/23 at [place] with [facility] on the phone to advise of follow up appointments next week and anticipated dc today per patient. [place] to provide transportation home.   Patient has home oxygen which he uses mostly at night. He states that he is process of getting new cpap. He has not yet had a new sleep study. His DME oxygen provider is [employer].   TOC following for transition needs.  Expected Discharge Plan: Home/Self Care Barriers to Discharge: Continued Medical Work up  Expected Discharge Plan and Services Expected Discharge Plan: Home/Self Care In-house Referral: PCP / [facility] Discharge Planning Services: CM Consult Post Acute Care Choice: NA Living arrangements for the past 2 months: Apartment                 DME Arranged: N/A DME Agency: NA       HH Arranged: NA HH Agency: NA         Social Determinants of Health (SDOH) Interventions    Readmission Risk Interventions No flowsheet data found.

## 2019-09-13 NOTE — Progress Notes (Addendum)
Advanced Heart Failure Rounding Note  PCP-Cardiologist: No primary care provider on file.   Subjective:    S/P EGD- lesion noted antrum. Per GI, Biopsy from antral mass reported as inflamed hyperplastic gastric polyp, no evidence of H. pylori or intestinal metaplasia. Polyp is very broad-based and will not be amenable for resection but has increased risk of bleeding.  Eliquis restarted yesterday. Hgb stable 8.3>.9.2>>9.4. He had a BM last night w/o melena/ hematochezia.   Feels better today. No complaints. Denies dyspnea.   Scr better today, down from 2.02>>1.58.   Objective:   Weight Range: 115.7 kg Body mass index is 43.78 kg/m.   Vital Signs:   Temp:  [97.3 F (36.3 C)-98.7 F (37.1 C)] 98.1 F (36.7 C) (08/20 1000) Pulse Rate:  [55-59] 59 (08/20 1000) Resp:  [16-18] 16 (08/20 1000) BP: (102-158)/(62-104) 102/62 (08/20 1000) SpO2:  [99 %-100 %] 100 % (08/20 1000) Last BM Date: 09/12/19  Weight change: Filed Weights   09/08/19 1426  Weight: 115.7 kg    Intake/Output:   Intake/Output Summary (Last 24 hours) at 09/13/2019 1030 Last data filed at 09/13/2019 0900 Gross per 24 hour  Intake 600 ml  Output 0 ml  Net 600 ml      Physical Exam    PHYSICAL EXAM: General:  Moderately obese, well appearing.No respiratory difficulty HEENT: normal Neck: supple. JVD not well visualized. Carotids 2+ bilat; no bruits. No lymphadenopathy or thyromegaly appreciated. Cor: PMI nondisplaced. Regular rhythm, slow rate. No rubs, gallops or murmurs. Lungs: CTAB, no wheezing Abdomen: obese, soft, nontender, nondistended. No hepatosplenomegaly. No bruits or masses. Good bowel sounds. Extremities: no cyanosis, clubbing, rash, 1+ bilateral LE edema Neuro: alert & oriented x 3, cranial nerves grossly intact. moves all 4 extremities w/o difficulty. Affect pleasant.   Telemetry   NSR/ sinus brady, upper 50s- low 60s  EKG    No new EKG to review   Labs    CBC Recent Labs      09/12/19 0450 09/13/19 0400  WBC 5.7 5.5  HGB 9.2* 9.4*  HCT 34.4* 35.2*  MCV 104.9* 102.6*  PLT 121* 093*   Basic Metabolic Panel Recent Labs    09/12/19 0450 09/13/19 0400  NA 136 133*  K 4.0 4.6  CL 85* 86*  CO2 47* 42*  GLUCOSE 121* 81  BUN 31* 22*  CREATININE 2.02* 1.58*  CALCIUM 8.4* 8.5*  MG  --  2.0   Liver Function Tests No results for input(s): AST, ALT, ALKPHOS, BILITOT, PROT, ALBUMIN in the last 72 hours. No results for input(s): LIPASE, AMYLASE in the last 72 hours. Cardiac Enzymes No results for input(s): CKTOTAL, CKMB, CKMBINDEX, TROPONINI in the last 72 hours.  BNP: BNP (last 3 results) Recent Labs    06/20/19 1522 09/09/19 0219 09/13/19 0450  BNP 243.3* 214.9* 366.0*    ProBNP (last 3 results) No results for input(s): PROBNP in the last 8760 hours.   D-Dimer No results for input(s): DDIMER in the last 72 hours. Hemoglobin A1C No results for input(s): HGBA1C in the last 72 hours. Fasting Lipid Panel No results for input(s): CHOL, HDL, LDLCALC, TRIG, CHOLHDL, LDLDIRECT in the last 72 hours. Thyroid Function Tests No results for input(s): TSH, T4TOTAL, T3FREE, THYROIDAB in the last 72 hours.  Invalid input(s): FREET3  Other results:   Imaging    No results found.   Medications:     Scheduled Medications: . sodium chloride   Intravenous Once  . amiodarone  200  mg Oral Daily  . apixaban  5 mg Oral BID  . atorvastatin  80 mg Oral q1800  . carvedilol  3.125 mg Oral BID WC  . Chlorhexidine Gluconate Cloth  6 each Topical Daily  . hydrALAZINE  12.5 mg Oral Q8H  . pantoprazole  40 mg Oral Daily  . sodium chloride flush  10-40 mL Intracatheter Q12H    Infusions: . sodium chloride 10 mL/hr at 09/10/19 1126    PRN Medications: sodium chloride, acetaminophen **OR** acetaminophen, albuterol, ondansetron **OR** ondansetron (ZOFRAN) IV, polyethylene glycol, sodium chloride flush, sodium chloride flush     Assessment/Plan    1.GIB, upper - Hematemesis/melena on admit. GI consulted.  - EGD with lesion noted in antrum.  - Biopsy from antral mass reported as inflamed hyperplastic gastric polyp, no evidence of H. pylori or intestinal metaplasia. Polyp is very broad-based and will not be amenable for resection but has increased risk of bleeding. - Eliquis resumed w/ stable Hgb, up to 9.4 today. No further melena - Per GI, will need PPI indefinitely. Continue Protonix 40 mg daily     2. PAF - S/P DC-CV 07/15/19. Back in A fib RVR on admit.  - Started on amio drip for A fib RVR. Now in NSR/SB - Now on po amio 200 mg daily + coreg 3.125 bid  - Continue Eliquis 5 mg bid     3. Chronic Diastolic HF--> RV Failure - TEE in 9/20 showed EF 55%, severe RV dilation with moderately decreased systolic function, - D-shaped septum.  RV failure is in the setting of OSA/OHS.  - volume status ok, SCr improved. Restart Home Torsemide today.   4. OHS/OSA  - On chronic 2 liters [place].  - Needs another sleep study but he cant afford the co pay.   4. H/O Stroke - On statin.  - Continue Eliquis   5. H/O PFO closure 2020   6. HTN  - controlled on current regimen  - continue coreg 3.125 bid - continue 12.5 mg hydralazine tid.   7. AKI - improving, Scr down 2.02>>1.58 - resume home torsemide dose on d/c - will repeat BMP at clinic f/u next week   Ok for d/c from HF standpoint. He has close clinic f/u next week on 8/24. Will place appt info in AVS. We will obtain repeat CBC and BMP at f/u.   Discharge Meds Atorvastatin 80 mg Amiodarone 200 mg daily (new) Coreg 3.125 mg bid (reduced dose) Eliquis 5 mg bid Hydralazine 12.5 mg tid (reduced dose) Protonix 40 mg daily  Torsemide 80 qam/40 qpm     Length of Stay: [address] PA-C  09/13/2019, 10:30 AM  Advanced Heart Failure Team Pager [phone] (M-F; 7a - 4p)  Please contact [facility] for night-coverage after hours (4p -7a ) and weekends on  [email or web address]   Patient seen and examined with the above-signed Advanced Practice Provider and/or Housestaff. I personally reviewed laboratory data, imaging studies and relevant notes. I independently examined the patient and formulated the important aspects of the plan. I have edited the note to reflect any of my changes or salient points. I have personally discussed the plan with the patient and/or family.  Doing well. Volume status looks good. Maintaining NSR. Tolerating restart of Eliquis   General: Sitting in chair . No resp difficulty HEENT: normal Neck: supple. no JVD. Carotids 2+ bilat; no bruits. No lymphadenopathy or thryomegaly appreciated. Cor: PMI nondisplaced. Regular rate & rhythm. No rubs, gallops or murmurs. Lungs:  clear Abdomen: obese soft, nontender, nondistended. No hepatosplenomegaly. No bruits or masses. Good bowel sounds. Extremities: no cyanosis, clubbing, rash, edema Neuro: alert & orientedx3, cranial nerves grossly intact. moves all 4 extremities w/o difficulty. Affect pleasant  Ok for d/c from our standpoint. Meds as above. Will arrange f/u in HF Clinic.   Glori Bickers, MD  12:37 PM

## 2019-09-13 NOTE — Progress Notes (Signed)
DISCHARGE NOTE HOME  Douglas [name]. to be discharged Home per MD order. Discussed prescriptions and follow up appointments with the patient. Prescriptions given to patient; medication list explained in detail. Patient verbalized understanding.  Skin clean, dry and intact without evidence of skin break down, no evidence of skin tears noted. IV catheter discontinued intact. Site without signs and symptoms of complications. Dressing and pressure applied. Pt denies pain at the site currently. No complaints noted.  Patient free of lines, drains, and wounds.   An After Visit Summary (AVS) was printed and given to the patient. Patient escorted via wheelchair, and discharged home via private auto.  Beatris Ship, RN

## 2019-09-17 ENCOUNTER — Encounter (HOSPITAL_COMMUNITY): Payer: Self-pay

## 2019-09-17 ENCOUNTER — Ambulatory Visit (HOSPITAL_COMMUNITY)
Admission: RE | Admit: 2019-09-17 | Discharge: 2019-09-17 | Disposition: A | Discharge status: Home / Self Care | Payer: Medicaid Other | Source: Ambulatory Visit | Attending: Adult Health | Admitting: Adult Health

## 2019-09-17 ENCOUNTER — Other Ambulatory Visit: Payer: Self-pay

## 2019-09-17 ENCOUNTER — Other Ambulatory Visit (HOSPITAL_COMMUNITY): Payer: Self-pay

## 2019-09-17 VITALS — BP 144/86 | HR 64 | Wt 273.2 lb

## 2019-09-17 DIAGNOSIS — I4892 Unspecified atrial flutter: Secondary | ICD-10-CM | POA: Insufficient documentation

## 2019-09-17 DIAGNOSIS — Z9981 Dependence on supplemental oxygen: Secondary | ICD-10-CM | POA: Diagnosis not present

## 2019-09-17 DIAGNOSIS — M1990 Unspecified osteoarthritis, unspecified site: Secondary | ICD-10-CM | POA: Diagnosis not present

## 2019-09-17 DIAGNOSIS — I69322 Dysarthria following cerebral infarction: Secondary | ICD-10-CM | POA: Diagnosis not present

## 2019-09-17 DIAGNOSIS — I10 Essential (primary) hypertension: Secondary | ICD-10-CM | POA: Diagnosis not present

## 2019-09-17 DIAGNOSIS — Z8719 Personal history of other diseases of the digestive system: Secondary | ICD-10-CM | POA: Diagnosis not present

## 2019-09-17 DIAGNOSIS — E662 Morbid (severe) obesity with alveolar hypoventilation: Secondary | ICD-10-CM | POA: Diagnosis not present

## 2019-09-17 DIAGNOSIS — I2510 Atherosclerotic heart disease of native coronary artery without angina pectoris: Secondary | ICD-10-CM | POA: Diagnosis not present

## 2019-09-17 DIAGNOSIS — I69351 Hemiplegia and hemiparesis following cerebral infarction affecting right dominant side: Secondary | ICD-10-CM | POA: Diagnosis not present

## 2019-09-17 DIAGNOSIS — E7800 Pure hypercholesterolemia, unspecified: Secondary | ICD-10-CM | POA: Diagnosis not present

## 2019-09-17 DIAGNOSIS — Z7901 Long term (current) use of anticoagulants: Secondary | ICD-10-CM | POA: Insufficient documentation

## 2019-09-17 DIAGNOSIS — Z6841 Body Mass Index (BMI) 40.0 and over, adult: Secondary | ICD-10-CM | POA: Insufficient documentation

## 2019-09-17 DIAGNOSIS — I480 Paroxysmal atrial fibrillation: Secondary | ICD-10-CM

## 2019-09-17 DIAGNOSIS — Z8774 Personal history of (corrected) congenital malformations of heart and circulatory system: Secondary | ICD-10-CM | POA: Insufficient documentation

## 2019-09-17 DIAGNOSIS — I110 Hypertensive heart disease with heart failure: Secondary | ICD-10-CM | POA: Insufficient documentation

## 2019-09-17 DIAGNOSIS — Z79899 Other long term (current) drug therapy: Secondary | ICD-10-CM | POA: Insufficient documentation

## 2019-09-17 DIAGNOSIS — Z8249 Family history of ischemic heart disease and other diseases of the circulatory system: Secondary | ICD-10-CM | POA: Diagnosis not present

## 2019-09-17 DIAGNOSIS — Z87891 Personal history of nicotine dependence: Secondary | ICD-10-CM | POA: Diagnosis not present

## 2019-09-17 DIAGNOSIS — I5032 Chronic diastolic (congestive) heart failure: Secondary | ICD-10-CM | POA: Diagnosis present

## 2019-09-17 MED ORDER — TORSEMIDE 20 MG PO TABS: ORAL_TABLET | ORAL | 3 refills | Status: AC

## 2019-09-17 MED ORDER — APIXABAN 5 MG PO TABS: 5.0000 mg | ORAL_TABLET | Freq: Two times a day (BID) | ORAL | 3 refills | Status: AC

## 2019-09-17 MED ORDER — HYDRALAZINE HCL 25 MG PO TABS: 25.0000 mg | ORAL_TABLET | Freq: Three times a day (TID) | ORAL | 3 refills | Status: AC

## 2019-09-17 NOTE — Patient Instructions (Addendum)
Lab work done today. We will notify you of any abnormal lab work. No news is good news!  EKG done today.  INCREASE Hydralazine to 25mg  (1 tab) three times daily.  INCREASE Torsemide to 80mg  every morning AND 60mg  every evening.  Please follow up with the [facility] in 2 weeks.  At the [facility], you and your health needs are our priority. As part of our continuing mission to provide you with exceptional heart care, we have created designated Provider Care Teams. These Care Teams include your primary Cardiologist (physician) and Advanced Practice Providers (APPs- Physician Assistants and Nurse Practitioners) who all work together to provide you with the care you need, when you need it.   You may see any of the following providers on your designated Care Team at your next follow up: Marland Kitchen Dr Glori Bickers . Dr [name] . Darrick Grinder, NP . Lyda Jester, PA . Audry Riles, PharmD   Please be sure to bring in all your medications bottles to every appointment.

## 2019-09-17 NOTE — Progress Notes (Signed)
Patient ID: Douglas [name]., male   DOB: 02-17-1985, 34 y.o.   MRN: [id number]    Advanced Heart Failure Clinic Note   PCP: [facility], [facility] Primary Cardiologist: Dr Aundra Dubin  Pulmonary: Dr Halford Chessman    HPI: [name] is a 34 y.o. with OHS/OSA, morbid obesity, HTN, diastolic CHF with prominent RV failure.   Admitted in August 2016  to [facility] with volume overload. Diuresed with lasix drip and diamox. Transitioned to lasix 80 mg twice a day. Discharge weight 257 pounds.   Admit to [facility] 3/21 through 04/28/15 with respiratory distress. Required short term intubation. Apparently had not been wearing BiPap. Discharge weight was 259 pounds.   Admitted with AMS in the setting of acute stroke in 9/20. Suspect that he had a cardio-embolic CVA. On TEE,  swirling with spontaneous contrast noted in the severely dilated RA. There was a moderate-sized PFO with bidirectional shunting. Strongly suspect this was the source of his CVA. Atrial fibrillation has not been seen, no LA appendage thrombus.  Coronary CTA in 9/20 showed nonobstructive mild C  AD.  Discharged on Eliquis.  He was referred to Dr. Burt Knack for PFO closure, which was performed 05/16/19.   His last f/u was w/ Dr. Aundra Dubin in March. He was mildly volume overloaded at the time and torsemide was increased to 80 mg bid.   He returns today for followup of OHS/OSA, diastolic CHF with RV failure, and CVA with PFO.  Right-sided weakness and dysarthria with stroke are almost totally resolved.  He is using home oxygen but does not have CPAP.  He is trying to get a sleep study but has to pay a lot out of pocket for it and does not have the money right now.   S/P TEE DC-CV on 07/15/19 -->NSR   Per HF Paramedicine Team he was tachycardic 116 bpm on 08/06/19 and at previous visit he had been regular on exam with heart in the 70s.   On 09/06/19 he was evaluated by HF Paramedicine and he had not been taking his meds as ordered because his pill  counts were off.   Admitted 09/07/19 with GI bleed and hemorrhagic shock. GI consulted and he had EGD. Mass showed large inflamed gastric polyp, biopsy consistent with inflamed hyperplastic polyp, no evidence of metaplasia, no H. Pylori. Discharged on carvedilol 3.125 mg twice a day, amio 200 mg daily, hydralazine 12.5 mg three times a day, and torsemide 80 mg/40 mg. Discharge weight 277 pounds.  Today he returns for HF follow up. Overall feeling fine. Complaining of leg edema. Mild SOB with exertion. Denies PND/Orthopnea. No BRBPR.  Wearing 2 liters [place].  Appetite ok. No fever or chills. Weight at home  270 pounds. Taking all medications. Followed by HF Paramedicine. Lives alone.    - ECHO 03/20/2014 EF 60-65% - ECHO 8/18: EF 55-60%, grade II diastolic dydsfunction, D-shaped septum suggesting RV pressure/volume overload, severe RV dilation with mild-moderately decreased RV systolic function, unable to estimate PA pressure.  - ECHO 5/20: EF 55-60%, moderate RV dilation with mildly decreased systolic function, unable to estimate PASP, D-shaped interventricular septum.  - TEE 9/20: EF 55%, severely dilated RV with moderately decreased systolic function, D-shaped septum, large PFO with bidirectional shunting, severe RAE with spontaneous contrast in RA.  - Coronary CTA (9/20): Nonobstructive disease.  -07/15/19 S/P  DC-CV TEE--> NSR EF 60-65%   Review of systems complete and found to be negative unless listed in HPI.   SH:  Social  History   Socioeconomic History  . Marital status: Single    Spouse name: Not on file  . Number of children: 2  . Years of education: Not on file  . Highest education level: Not on file  Occupational History  . Occupation: N/A    Employer: NOT EMPLOYED  Tobacco Use  . Smoking status: Former Smoker    Packs/day: 1.50    Years: 3.00    Pack years: 4.50    Types: Cigarettes    Quit date: 10/31/2013    Years since quitting: 5.8  . Smokeless tobacco: Never Used    Substance and Sexual Activity  . Alcohol use: No    Alcohol/week: 1.0 standard drink    Types: 1 Cans of beer per week  . Drug use: No    Types: Cocaine    Comment: 10/31/2013 "might use cocaine once/month" - Has not used since 2015.   Marland Kitchen Sexual activity: Never  Other Topics Concern  . Not on file  Social History Narrative  . Not on file   Social Determinants of Health   Financial Resource Strain:   . Difficulty of Paying Living Expenses: Not on file  Food Insecurity:   . Worried About Charity fundraiser in the Last Year: Not on file  . Ran Out of Food in the Last Year: Not on file  Transportation Needs:   . Lack of Transportation (Medical): Not on file  . Lack of Transportation (Non-Medical): Not on file  Physical Activity:   . Days of Exercise per Week: Not on file  . Minutes of Exercise per Session: Not on file  Stress:   . Feeling of Stress : Not on file  Social Connections:   . Frequency of Communication with Friends and Family: Not on file  . Frequency of Social Gatherings with Friends and Family: Not on file  . Attends Religious Services: Not on file  . Active Member of Clubs or Organizations: Not on file  . Attends Archivist Meetings: Not on file  . Marital Status: Not on file  Intimate Partner Violence:   . Fear of Current or Ex-Partner: Not on file  . Emotionally Abused: Not on file  . Physically Abused: Not on file  . Sexually Abused: Not on file    FH:  Family History  Problem Relation Age of Onset  . Heart disease Mother        Pacemaker  . Hypertension Father   . Cancer Paternal Grandfather   . Heart attack Neg Hx   . Stroke Neg Hx     Past Medical History:  Diagnosis Date  . Arthritis    "hands, feet" (10/31/2013)  . CHF (congestive heart failure) ([place])   . Chronic bronchitis ([place])   . High cholesterol   . Hypertension   . Obesity   . OSA on CPAP   . Pneumonia 09/2011  . Substance abuse ([place]) 8/31/213   cociane "first time use"   . Tobacco abuse     Current Outpatient Medications  Medication Sig Dispense Refill  . amiodarone (PACERONE) 200 MG tablet Take 1 tablet (200 mg total) by mouth daily. 30 tablet 0  . atorvastatin (LIPITOR) 80 MG tablet Take 1 tablet (80 mg total) by mouth daily at 6 PM. 30 tablet 11  . carvedilol (COREG) 3.125 MG tablet Take 1 tablet (3.125 mg total) by mouth 2 (two) times daily with a meal. 60 tablet 0  . ELIQUIS 5 MG TABS tablet  TAKE 1 TABLET (5 MG TOTAL) BY MOUTH 2 (TWO) TIMES DAILY. (Patient taking differently: Take 5 mg by mouth 2 (two) times daily. ) 60 tablet 6  . hydrALAZINE (APRESOLINE) 25 MG tablet Take 0.5 tablets (12.5 mg total) by mouth 3 (three) times daily.    . OXYGEN Inhale 2 L/min into the lungs continuous.     . pantoprazole (PROTONIX) 40 MG tablet Take 1 tablet (40 mg total) by mouth daily. 30 tablet 3  . torsemide (DEMADEX) 20 MG tablet Take 4 tablets (80 mg total) by mouth in the morning AND 2 tablets (40 mg total) every evening.    Marland Kitchen albuterol (PROVENTIL HFA) 108 (90 Base) MCG/ACT inhaler Inhale 1-2 puffs into the lungs every 6 (six) hours as needed for wheezing or shortness of breath. 18 g 3   No current facility-administered medications for this encounter.    Vitals:   09/17/19 1219  BP: (!) 144/86  Pulse: 64  SpO2: 96%   Wt Readings from Last 3 Encounters:  09/17/19 123.9 kg (273 lb 3.2 oz)  09/13/19 125.8 kg (277 lb 5.4 oz)  09/06/19 126.6 kg (279 lb 1.6 oz)    Vitals:   09/17/19 1219  BP: (!) 144/86  Pulse: 64  SpO2: 96%    PHYSICAL EXAM: General:   No resp difficulty HEENT: normal Neck: supple.JVP 11-12 Carotids 2+ bilat; no bruits. No lymphadenopathy or thryomegaly appreciated. Cor: PMI nondisplaced. Tachy Regular rate & rhythm. No rubs, gallops or murmurs. Lungs: clear on 2 liters.  Abdomen: soft, nontender, nondistended. No hepatosplenomegaly. No bruits or masses. Good bowel sounds. Extremities: no cyanosis, clubbing, rash, R and LLE 2+  edema Neuro: alert & orientedx3, cranial nerves grossly intact. moves all 4 extremities w/o difficulty. Affect pleasant  EKG SR 1st degree heart block 63 bpm   ASSESSMENT & PLAN:  1. Chronic Diastolic CHF with prominent RV failure: TEE in 9/20 showed EF 55%, severe RV dilation with moderately decreased systolic function, D-shaped septum.  RV failure is in the setting of OSA/OHS.  RVH on ECG. - NYHA III. Volume trending up suspect in the setting if high sodium diet.  - Continue torsemide 80 mg in am and increase evening dose to 60 mg.  - Discussed low salt food choices.  - Check BMET.   2. OHS/OSA:  CPAP was removed because he stopped using it. Needs another sleep study but does not have the money for it right now. He is using home oxygen at all times.  - Continue home oxygen.  - We are trying to determine his co-pay to obtain sleep study. Given A fib, I think we need to try and get  another sleep study.  3. HTN:   - Elelvated. Increase hydralazine 25 mg three times a day  - Continue carvedilol 3.125 mg twice a day.  4. CAD: Nonobstructive on coronary CTA in 9/20.  - No chest pain.  - Continue statin.  5. Stroke: L MCA stroke secondary to PFO with paradoxical emboli. PFO with bidirectional flow was found on TEE, suggesting stroke was related to paradoxical emboli.  DVT studies negative.  Atrial fibrillation not visualized at the time, but now in atrial flutter.  - Continue Eliquis 5 mg twice daily  6. PFO: s/p PFO closure by Dr. Burt Knack 05/16/18.  7. New Atrial Flutter w/ RVR:  S/P DC-CV 07/15/19-->NSR  In SR today. Continue amio 200 mg daily .  - Continue Eliquis 5 mg bid 8. Obesity Body mass index is  46.89 kg/m. Discussed portion control.   Follow up in 2-3 weeks. Check BMET today. Increasing hydralazine and coreg as noted above. Discussed medication changes with HF Paramedicine. Pill box was set up during this visit.   Darrick Grinder, NP 09/17/2019

## 2019-09-17 NOTE — Progress Notes (Signed)
Paramedicine Encounter   Patient ID: Douglas [name] , male,   DOB: 03-28-1985,34 y.o.,  MRN: [id number]  [name] was seen at the HF clinic today with Darrick Grinder, NP, and reported feeling well. Per [name], his torsemide and hydralazine were increased. I made these changes to his pillbox and will follow up next week. He was advised to reach out to me if he should have any problems or concerns prior to next week. He was understanding and agreeable.   [name], EMT 09/17/2019   ACTION: Home visit completed Next visit planned for 1 week

## 2019-09-18 ENCOUNTER — Other Ambulatory Visit: Payer: Self-pay | Admitting: Gastroenterology

## 2019-09-25 ENCOUNTER — Other Ambulatory Visit (HOSPITAL_COMMUNITY): Payer: Self-pay | Admitting: Cardiology

## 2019-09-26 ENCOUNTER — Telehealth (HOSPITAL_COMMUNITY): Payer: Self-pay

## 2019-09-26 NOTE — Telephone Encounter (Signed)
I called Douglas [name] to schedule an appointment. He stated he was out with his mom and sister today but would be able to meet with me tomorrow. We agreed to meet at 13:00.   [name], EMT

## 2019-09-27 ENCOUNTER — Telehealth (HOSPITAL_COMMUNITY): Payer: Self-pay

## 2019-09-27 NOTE — Telephone Encounter (Signed)
[name] called me to advise he was just leaving his eye doctor appointment and would not be home in tome for our scheduled visit. He stated he has all his medications and has been taking them as prescribed. I advised I would be available to see him next week and he was agreeable. I will reach out on Tuesday to schedule an appointment.   [name], EMT

## 2019-10-02 ENCOUNTER — Encounter (HOSPITAL_COMMUNITY): Payer: Medicaid Other

## 2019-10-04 ENCOUNTER — Other Ambulatory Visit (HOSPITAL_COMMUNITY): Payer: Self-pay

## 2019-10-04 NOTE — Progress Notes (Signed)
Paramedicine Encounter    Patient ID: Douglas [name]., male    DOB: 1985-03-07, 34 y.o.   MRN: [id number]   Patient Care Team: Medicine, [facility] as PCP - General (Family Medicine) Larey Dresser, MD as PCP - Advanced Heart Failure (Cardiology)  Patient Active Problem List   Diagnosis Date Noted  . Acute blood loss anemia 09/08/2019  . Biventricular congestive heart failure ([place]) 09/08/2019  . Chronic respiratory failure with hypoxia ([place]) 09/08/2019  . Pulmonary hypertension with chronic cor pulmonale ([place]) 09/08/2019  . Melena 09/08/2019  . PFO (patent foramen ovale) 05/16/2019  . CVA (cerebral vascular accident) ([place]) 10/14/2018  . Morbid obesity ([place]) 06/17/2015  . Essential hypertension 04/14/2015  . Acute hyperkalemia 04/14/2015  . OSA (obstructive sleep apnea)   . Difficult intravenous access   . Chronic diastolic CHF (congestive heart failure), NYHA class 3 ([place]) 09/13/2014  . Cor pulmonale, chronic ([place]) 03/19/2014  . OSA treated with BiPAP     Current Outpatient Medications:  .  amiodarone (PACERONE) 200 MG tablet, Take 1 tablet (200 mg total) by mouth daily., Disp: 30 tablet, Rfl: 0 .  apixaban (ELIQUIS) 5 MG TABS tablet, Take 1 tablet (5 mg total) by mouth 2 (two) times daily., Disp: 60 tablet, Rfl: 3 .  atorvastatin (LIPITOR) 80 MG tablet, Take 1 tablet (80 mg total) by mouth daily at 6 PM., Disp: 30 tablet, Rfl: 11 .  carvedilol (COREG) 3.125 MG tablet, Take 1 tablet (3.125 mg total) by mouth 2 (two) times daily with a meal., Disp: 60 tablet, Rfl: 0 .  hydrALAZINE (APRESOLINE) 25 MG tablet, Take 1 tablet (25 mg total) by mouth 3 (three) times daily., Disp: 90 tablet, Rfl: 3 .  OXYGEN, Inhale 2 L/min into the lungs continuous. , Disp: , Rfl:  .  pantoprazole (PROTONIX) 40 MG tablet, Take 1 tablet (40 mg total) by mouth daily., Disp: 30 tablet, Rfl: 3 .  PROAIR HFA 108 (90 Base) MCG/ACT inhaler, INHALE 1 TO 2 PUFFS INTO THE LUNGS EVERY 6 (SIX)  HOURS AS NEEDED FOR WHEEZING OR SHORTNESS OF BREATH., Disp: 8.5 g, Rfl: 2 .  torsemide (DEMADEX) 20 MG tablet, Take 4 tablets (80 mg total) by mouth in the morning AND 3 tablets (60 mg total) every evening., Disp: 210 tablet, Rfl: 3 Allergies  Allergen Reactions  . Lisinopril Cough      Social History   Socioeconomic History  . Marital status: Single    Spouse name: Not on file  . Number of children: 2  . Years of education: Not on file  . Highest education level: Not on file  Occupational History  . Occupation: N/A    Employer: NOT EMPLOYED  Tobacco Use  . Smoking status: Former Smoker    Packs/day: 1.50    Years: 3.00    Pack years: 4.50    Types: Cigarettes    Quit date: 10/31/2013    Years since quitting: 5.9  . Smokeless tobacco: Never Used  Substance and Sexual Activity  . Alcohol use: No    Alcohol/week: 1.0 standard drink    Types: 1 Cans of beer per week  . Drug use: No    Types: Cocaine    Comment: 10/31/2013 "might use cocaine once/month" - Has not used since 2015.   Marland Kitchen Sexual activity: Never  Other Topics Concern  . Not on file  Social History Narrative  . Not on file   Social Determinants of Health   Financial  Resource Strain:   . Difficulty of Paying Living Expenses: Not on file  Food Insecurity:   . Worried About Charity fundraiser in the Last Year: Not on file  . Ran Out of Food in the Last Year: Not on file  Transportation Needs:   . Lack of Transportation (Medical): Not on file  . Lack of Transportation (Non-Medical): Not on file  Physical Activity:   . Days of Exercise per Week: Not on file  . Minutes of Exercise per Session: Not on file  Stress:   . Feeling of Stress : Not on file  Social Connections:   . Frequency of Communication with Friends and Family: Not on file  . Frequency of Social Gatherings with Friends and Family: Not on file  . Attends Religious Services: Not on file  . Active Member of Clubs or Organizations: Not on file  .  Attends Archivist Meetings: Not on file  . Marital Status: Not on file  Intimate Partner Violence:   . Fear of Current or Ex-Partner: Not on file  . Emotionally Abused: Not on file  . Physically Abused: Not on file  . Sexually Abused: Not on file    Physical Exam Cardiovascular:     Rate and Rhythm: Normal rate and regular rhythm.     Pulses: Normal pulses.  Pulmonary:     Effort: Pulmonary effort is normal.     Breath sounds: Normal breath sounds.  Musculoskeletal:        General: Normal range of motion.     Right lower leg: Edema present.     Left lower leg: Edema present.  Skin:    General: Skin is warm and dry.     Capillary Refill: Capillary refill takes less than 2 seconds.  Neurological:     Mental Status: He is alert and oriented to person, place, and time.  Psychiatric:        Mood and Affect: Mood normal.         Future Appointments  Date Time Provider [place]  10/22/2019 10:30 AM [facility]-SCREENING [facility] None  10/28/2019 11:30 AM [facility] PA/NP [facility] None    BP (!) 133/92 (BP Location: Left Arm, Patient Position: Sitting, Cuff Size: Normal)   Pulse 66   Resp 18   Wt 269 lb (122 kg)   SpO2 95%   BMI 46.17 kg/m   Weight yesterday- did not weigh Last visit weight- 273 lb  [name] was seen at home today and reported feeling generally well. He denied chest pain, SOB, headache, dizziness, orthopnea, fever or cough over the past week. He stated he has been compliant with his medications and his weight has been generally stable. He exhibits BLEE which has been present for the past few visits but has not worsened. He stated he has been limiting his fluid intake but not necessarily the sodium in his diet which could be the reason for swelling. His medications were verified and his pillbox was refilled. Necessary refills were ordered and will be delivered next week. I will follow up in two weeks.   Douglas [name],  EMT 10/04/19  ACTION: Home visit completed Next visit planned for 2 weeks

## 2019-10-07 ENCOUNTER — Telehealth (HOSPITAL_COMMUNITY): Payer: Self-pay

## 2019-10-07 NOTE — Telephone Encounter (Signed)
Surgical Clearance sent from [facility] for endoscopy on 10/1 due to "large gastric hyperplastic polyps and Anemia"  Per Dr. Aundra Dubin: "ok to hold Eliquis x1 day for procedure"  Faxed to [phone] on 9/13

## 2019-10-15 ENCOUNTER — Encounter (HOSPITAL_COMMUNITY): Payer: Self-pay | Admitting: Gastroenterology

## 2019-10-15 NOTE — Progress Notes (Signed)
Attempted to obtain medical history via telephone, unable to reach at this time. I left a voicemail to return pre surgical testing department's phone call.  

## 2019-10-18 ENCOUNTER — Other Ambulatory Visit (HOSPITAL_COMMUNITY): Payer: Self-pay

## 2019-10-18 NOTE — Progress Notes (Signed)
Paramedicine Encounter    Patient ID: Douglas Madura., male    DOB: 12-Oct-1985, 34 y.o.   MRN: [id number]   Patient Care Team: Medicine, [facility] as PCP - General (Family Medicine) Larey Dresser, MD as PCP - Advanced Heart Failure (Cardiology)  Patient Active Problem List   Diagnosis Date Noted  . Acute blood loss anemia 09/08/2019  . Biventricular congestive heart failure ([place]) 09/08/2019  . Chronic respiratory failure with hypoxia ([place]) 09/08/2019  . Pulmonary hypertension with chronic cor pulmonale ([place]) 09/08/2019  . Melena 09/08/2019  . PFO (patent foramen ovale) 05/16/2019  . CVA (cerebral vascular accident) ([place]) 10/14/2018  . Morbid obesity ([place]) 06/17/2015  . Essential hypertension 04/14/2015  . Acute hyperkalemia 04/14/2015  . OSA (obstructive sleep apnea)   . Difficult intravenous access   . Chronic diastolic CHF (congestive heart failure), NYHA class 3 ([place]) 09/13/2014  . Cor pulmonale, chronic ([place]) 03/19/2014  . OSA treated with BiPAP     Current Outpatient Medications:  .  amiodarone (PACERONE) 200 MG tablet, Take 1 tablet (200 mg total) by mouth daily., Disp: 30 tablet, Rfl: 0 .  apixaban (ELIQUIS) 5 MG TABS tablet, Take 1 tablet (5 mg total) by mouth 2 (two) times daily., Disp: 60 tablet, Rfl: 3 .  atorvastatin (LIPITOR) 80 MG tablet, Take 1 tablet (80 mg total) by mouth daily at 6 PM., Disp: 30 tablet, Rfl: 11 .  carvedilol (COREG) 3.125 MG tablet, Take 1 tablet (3.125 mg total) by mouth 2 (two) times daily with a meal., Disp: 60 tablet, Rfl: 0 .  hydrALAZINE (APRESOLINE) 25 MG tablet, Take 1 tablet (25 mg total) by mouth 3 (three) times daily., Disp: 90 tablet, Rfl: 3 .  OXYGEN, Inhale 2 L/min into the lungs continuous. , Disp: , Rfl:  .  pantoprazole (PROTONIX) 40 MG tablet, Take 1 tablet (40 mg total) by mouth daily., Disp: 30 tablet, Rfl: 3 .  PROAIR HFA 108 (90 Base) MCG/ACT inhaler, INHALE 1 TO 2 PUFFS INTO THE LUNGS EVERY 6 (SIX)  HOURS AS NEEDED FOR WHEEZING OR SHORTNESS OF BREATH., Disp: 8.5 g, Rfl: 2 .  torsemide (DEMADEX) 20 MG tablet, Take 4 tablets (80 mg total) by mouth in the morning AND 3 tablets (60 mg total) every evening., Disp: 210 tablet, Rfl: 3 Allergies  Allergen Reactions  . Lisinopril Cough      Social History   Socioeconomic History  . Marital status: Single    Spouse name: Not on file  . Number of children: 2  . Years of education: Not on file  . Highest education level: Not on file  Occupational History  . Occupation: N/A    Employer: NOT EMPLOYED  Tobacco Use  . Smoking status: Former Smoker    Packs/day: 1.50    Years: 3.00    Pack years: 4.50    Types: Cigarettes    Quit date: 10/31/2013    Years since quitting: 5.9  . Smokeless tobacco: Never Used  Substance and Sexual Activity  . Alcohol use: No    Alcohol/week: 1.0 standard drink    Types: 1 Cans of beer per week  . Drug use: No    Types: Cocaine    Comment: 10/31/2013 "might use cocaine once/month" - Has not used since 2015.   Marland Kitchen Sexual activity: Never  Other Topics Concern  . Not on file  Social History Narrative  . Not on file   Social Determinants of Health   Financial  Resource Strain:   . Difficulty of Paying Living Expenses: Not on file  Food Insecurity:   . Worried About Charity fundraiser in the Last Year: Not on file  . Ran Out of Food in the Last Year: Not on file  Transportation Needs:   . Lack of Transportation (Medical): Not on file  . Lack of Transportation (Non-Medical): Not on file  Physical Activity:   . Days of Exercise per Week: Not on file  . Minutes of Exercise per Session: Not on file  Stress:   . Feeling of Stress : Not on file  Social Connections:   . Frequency of Communication with Friends and Family: Not on file  . Frequency of Social Gatherings with Friends and Family: Not on file  . Attends Religious Services: Not on file  . Active Member of Clubs or Organizations: Not on file  .  Attends Archivist Meetings: Not on file  . Marital Status: Not on file  Intimate Partner Violence:   . Fear of Current or Ex-Partner: Not on file  . Emotionally Abused: Not on file  . Physically Abused: Not on file  . Sexually Abused: Not on file    Physical Exam Cardiovascular:     Rate and Rhythm: Normal rate and regular rhythm.     Pulses: Normal pulses.  Pulmonary:     Effort: Pulmonary effort is normal.     Breath sounds: Normal breath sounds.  Musculoskeletal:        General: Normal range of motion.     Right lower leg: No edema.     Left lower leg: No edema.  Skin:    General: Skin is warm and dry.     Capillary Refill: Capillary refill takes less than 2 seconds.  Neurological:     Mental Status: He is alert and oriented to person, place, and time.  Psychiatric:        Mood and Affect: Mood normal.         Future Appointments  Date Time Provider [place]  10/22/2019 10:30 AM [facility]-SCREENING [facility] None  10/28/2019 11:30 AM [facility] PA/NP [facility] None    BP 90/60 (BP Location: Left Arm, Patient Position: Sitting, Cuff Size: Large)   Pulse 60   Resp 16   Wt 266 lb 8 oz (120.9 kg)   SpO2 95%   BMI 45.74 kg/m   Weight yesterday- 264.4 lb Last visit weight- 269 lb  Douglas [name] was seen at home today and reported feeling well. He denied chest pain, SOB, headache, dizziness, orthopnea, fever or cough over the past week. He stated he has been compliant with his medications over the past week and his weight has been stable. He did not have the correct carvedilol due to the pharmacy not delivering it two weeks ago and thus he has been taking 25 mg instead of the 3.125 mg tablet. I contacted the pharmacy and they assured me that it would be delivered today. His medications were verified ad his pillbox was refilled. He advised he would place the carvedilol in the box when it arrives this afternoon.    [name], EMT 10/18/19  ACTION: Home  visit completed Next visit planned for 1 week

## 2019-10-22 ENCOUNTER — Inpatient Hospital Stay (HOSPITAL_COMMUNITY): Admission: RE | Admit: 2019-10-22 | Payer: Medicaid Other | Source: Ambulatory Visit

## 2019-10-22 NOTE — Progress Notes (Signed)
Pt's sister Lars Mage contacted Probation officer to ascertain the date of his procedure with Dr. Therisa Doyne on 10/25/19. Sister will be transporting patient, and just became aware of the procedural appointment today.  Per pt's sister due to patient's cognitive deficits related to a prior stroke, pt is not always able to retain appointment  information. In providing the appointment date and time. Writer asked patient's sister if pt is responsible for taking his medication independently or if he requires assistance. Per pt's sister, pt has someone to assist him with medication administration. As a result, sister is not sure if pt has been required to hold his blood thinner medication or not.   After speaking with pt's sister, contacted Dr. Encarnacion Slates office to verify if patient was expected to hold his Eliquis prior to the procedure. Per [name], pt is to hold his Eliquis for 1 day. [name] will contact pt's sister to obtain contact information of the individual who assist with pt's medication, so that they can be aware to hold medication 1 day prior to the procedure.    Marland Kitchen

## 2019-10-24 ENCOUNTER — Telehealth (HOSPITAL_COMMUNITY): Payer: Self-pay

## 2019-10-24 NOTE — Telephone Encounter (Signed)
I called Mr Pistilli to see if he was home for our scheduled visit. He stated he was at the hospital with his father who is currently intubated and doing poorly due to COVID-19. He stated the family was told nothing more could be done so they were spending as much time at bedside as possible. I suggested for him to take as much time as he needs and to contact me when he has some free time to meet. He was understanding and agreeable. I will reach out towards the end of next week if I have not heard from him before then.   [name], EMT 10/24/19

## 2019-10-24 NOTE — Progress Notes (Signed)
Called patient to confirm appointment for tomorrow with dr. Therisa Doyne for EGD at hospital.Patient has not had his covid test.  Patient stated that he would have to cancel this appointment and reschedule it due to a family member being in the hospital with covid. I gave him the office number for dr. Therisa Doyne to reschedule [phone]

## 2019-10-25 ENCOUNTER — Other Ambulatory Visit (HOSPITAL_COMMUNITY): Payer: Self-pay

## 2019-10-25 ENCOUNTER — Encounter (HOSPITAL_COMMUNITY): Admission: RE | Payer: Self-pay | Source: Home / Self Care

## 2019-10-25 ENCOUNTER — Other Ambulatory Visit (HOSPITAL_COMMUNITY): Payer: Self-pay | Admitting: *Deleted

## 2019-10-25 ENCOUNTER — Ambulatory Visit (HOSPITAL_COMMUNITY): Admission: RE | Admit: 2019-10-25 | Payer: Medicaid Other | Source: Home / Self Care | Admitting: Gastroenterology

## 2019-10-25 SURGERY — ESOPHAGOGASTRODUODENOSCOPY (EGD) WITH PROPOFOL
Anesthesia: Monitor Anesthesia Care

## 2019-10-25 MED ORDER — ATORVASTATIN CALCIUM 80 MG PO TABS: 80.0000 mg | ORAL_TABLET | Freq: Every day | ORAL | 11 refills | Status: AC

## 2019-10-25 MED ORDER — CARVEDILOL 3.125 MG PO TABS: 3.1250 mg | ORAL_TABLET | Freq: Two times a day (BID) | ORAL | 3 refills | Status: AC

## 2019-10-25 NOTE — Progress Notes (Signed)
Paramedicine Encounter    Patient ID: Douglas Madura., male    DOB: 1985/10/15, 34 y.o.   MRN: [id number]   Patient Care Team: Medicine, [facility] as PCP - General (Family Medicine) Larey Dresser, MD as PCP - Advanced Heart Failure (Cardiology)  Patient Active Problem List   Diagnosis Date Noted  . Acute blood loss anemia 09/08/2019  . Biventricular congestive heart failure ([place]) 09/08/2019  . Chronic respiratory failure with hypoxia ([place]) 09/08/2019  . Pulmonary hypertension with chronic cor pulmonale ([place]) 09/08/2019  . Melena 09/08/2019  . PFO (patent foramen ovale) 05/16/2019  . CVA (cerebral vascular accident) ([place]) 10/14/2018  . Morbid obesity ([place]) 06/17/2015  . Essential hypertension 04/14/2015  . Acute hyperkalemia 04/14/2015  . OSA (obstructive sleep apnea)   . Difficult intravenous access   . Chronic diastolic CHF (congestive heart failure), NYHA class 3 ([place]) 09/13/2014  . Cor pulmonale, chronic ([place]) 03/19/2014  . OSA treated with BiPAP     Current Outpatient Medications:  .  amiodarone (PACERONE) 200 MG tablet, Take 1 tablet (200 mg total) by mouth daily., Disp: 30 tablet, Rfl: 0 .  apixaban (ELIQUIS) 5 MG TABS tablet, Take 1 tablet (5 mg total) by mouth 2 (two) times daily., Disp: 60 tablet, Rfl: 3 .  hydrALAZINE (APRESOLINE) 25 MG tablet, Take 1 tablet (25 mg total) by mouth 3 (three) times daily., Disp: 90 tablet, Rfl: 3 .  OXYGEN, Inhale 2 L/min into the lungs continuous. , Disp: , Rfl:  .  pantoprazole (PROTONIX) 40 MG tablet, Take 1 tablet (40 mg total) by mouth daily., Disp: 30 tablet, Rfl: 3 .  PROAIR HFA 108 (90 Base) MCG/ACT inhaler, INHALE 1 TO 2 PUFFS INTO THE LUNGS EVERY 6 (SIX) HOURS AS NEEDED FOR WHEEZING OR SHORTNESS OF BREATH., Disp: 8.5 g, Rfl: 2 .  torsemide (DEMADEX) 20 MG tablet, Take 4 tablets (80 mg total) by mouth in the morning AND 3 tablets (60 mg total) every evening., Disp: 210 tablet, Rfl: 3 .  atorvastatin  (LIPITOR) 80 MG tablet, Take 1 tablet (80 mg total) by mouth daily at 6 PM., Disp: 30 tablet, Rfl: 11 .  carvedilol (COREG) 3.125 MG tablet, Take 1 tablet (3.125 mg total) by mouth 2 (two) times daily with a meal., Disp: 60 tablet, Rfl: 3 Allergies  Allergen Reactions  . Lisinopril Cough      Social History   Socioeconomic History  . Marital status: Single    Spouse name: Not on file  . Number of children: 2  . Years of education: Not on file  . Highest education level: Not on file  Occupational History  . Occupation: N/A    Employer: NOT EMPLOYED  Tobacco Use  . Smoking status: Former Smoker    Packs/day: 1.50    Years: 3.00    Pack years: 4.50    Types: Cigarettes    Quit date: 10/31/2013    Years since quitting: 5.9  . Smokeless tobacco: Never Used  Substance and Sexual Activity  . Alcohol use: No    Alcohol/week: 1.0 standard drink    Types: 1 Cans of beer per week  . Drug use: No    Types: Cocaine    Comment: 10/31/2013 "might use cocaine once/month" - Has not used since 2015.   Marland Kitchen Sexual activity: Never  Other Topics Concern  . Not on file  Social History Narrative  . Not on file   Social Determinants of Health   Financial  Resource Strain:   . Difficulty of Paying Living Expenses: Not on file  Food Insecurity:   . Worried About Charity fundraiser in the Last Year: Not on file  . Ran Out of Food in the Last Year: Not on file  Transportation Needs:   . Lack of Transportation (Medical): Not on file  . Lack of Transportation (Non-Medical): Not on file  Physical Activity:   . Days of Exercise per Week: Not on file  . Minutes of Exercise per Session: Not on file  Stress:   . Feeling of Stress : Not on file  Social Connections:   . Frequency of Communication with Friends and Family: Not on file  . Frequency of Social Gatherings with Friends and Family: Not on file  . Attends Religious Services: Not on file  . Active Member of Clubs or Organizations: Not on  file  . Attends Archivist Meetings: Not on file  . Marital Status: Not on file  Intimate Partner Violence:   . Fear of Current or Ex-Partner: Not on file  . Emotionally Abused: Not on file  . Physically Abused: Not on file  . Sexually Abused: Not on file    Physical Exam Cardiovascular:     Rate and Rhythm: Normal rate and regular rhythm.     Pulses: Normal pulses.  Pulmonary:     Effort: Pulmonary effort is normal.     Breath sounds: Normal breath sounds.  Musculoskeletal:        General: Normal range of motion.     Right lower leg: Edema present.     Left lower leg: Edema present.  Skin:    General: Skin is warm and dry.     Capillary Refill: Capillary refill takes less than 2 seconds.  Neurological:     Mental Status: He is alert and oriented to person, place, and time.  Psychiatric:        Mood and Affect: Mood normal.         No future appointments.  BP 123/74 (BP Location: Left Arm, Patient Position: Sitting, Cuff Size: Large)   Pulse 62   Resp 18   Wt 266 lb (120.7 kg)   SpO2 99%   BMI 45.66 kg/m   Weight yesterday- did not weigh Last visit weight- 266 lb  Douglas [name] was seen at home today and reported feeling physically well. His father died yesterday secondary to a COVID-19 infection so he was feeling emotionally distraught. He state he has been trying to stay compliant with his medications however he has been at the hospital so much over the past week he missed several doses of his afternoon hydralazine. Upon verifying his medications, I noted that they delivered only a partial supply of carvedilol. I contacted the pharmacy and they advised they needed a new prescription of carvedilol 3.125 mg and atorvastatin 80 mg. The clinic sent these in and the pharmacist advised they would be ready this afternoon. Douglas [name] advised he would have his sister take him to pick them up later today and he would add them to his box. Upon looking over his  chart I noticed that his HF clinic appointment for Monday was canceled via the automated system so I spoke to [facility] at the clinic and she stated she would reach out next week to reschedule him so he would have time to know when his father's funeral would be. I relayed this to Douglas [name] and he was understanding and agreeable. I  will follow up next week.   [name], EMT 10/25/19  ACTION: Home visit completed Next visit planned for 1 week

## 2019-10-28 ENCOUNTER — Encounter (HOSPITAL_COMMUNITY): Payer: Medicaid Other

## 2019-11-07 ENCOUNTER — Encounter (HOSPITAL_COMMUNITY): Payer: Self-pay | Admitting: *Deleted

## 2019-11-07 NOTE — Progress Notes (Signed)
Pt's DC completed and signed by Dr Aundra Dubin, [facility] is aware and will p/u

## 2019-11-25 DEATH — deceased

## 2021-02-01 IMAGING — DX DG CHEST 2V
2 series · 2 of 2 positions shown · non-contrast
Comparison: Chest radiograph October 09, 2018.

CLINICAL DATA: Patient coughing up blood.

EXAM:
CHEST - 2 VIEW

[chest pa]
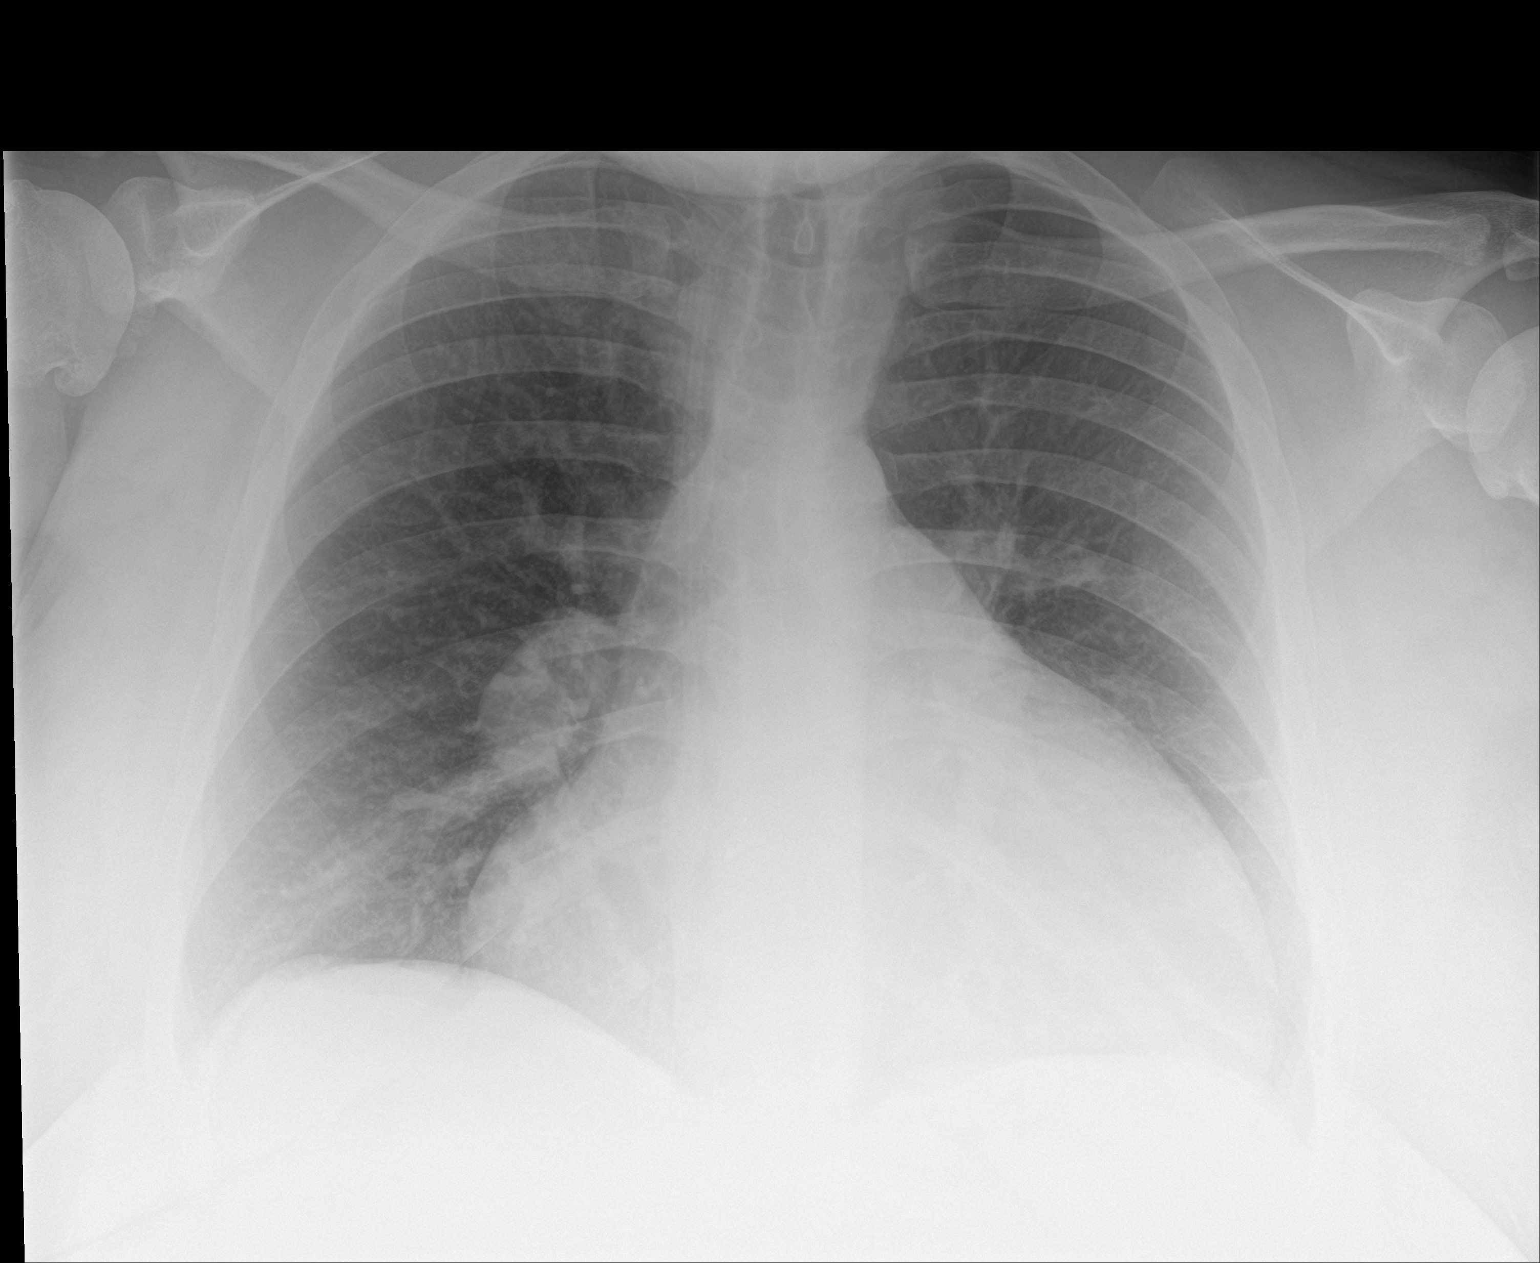

[chest lat]
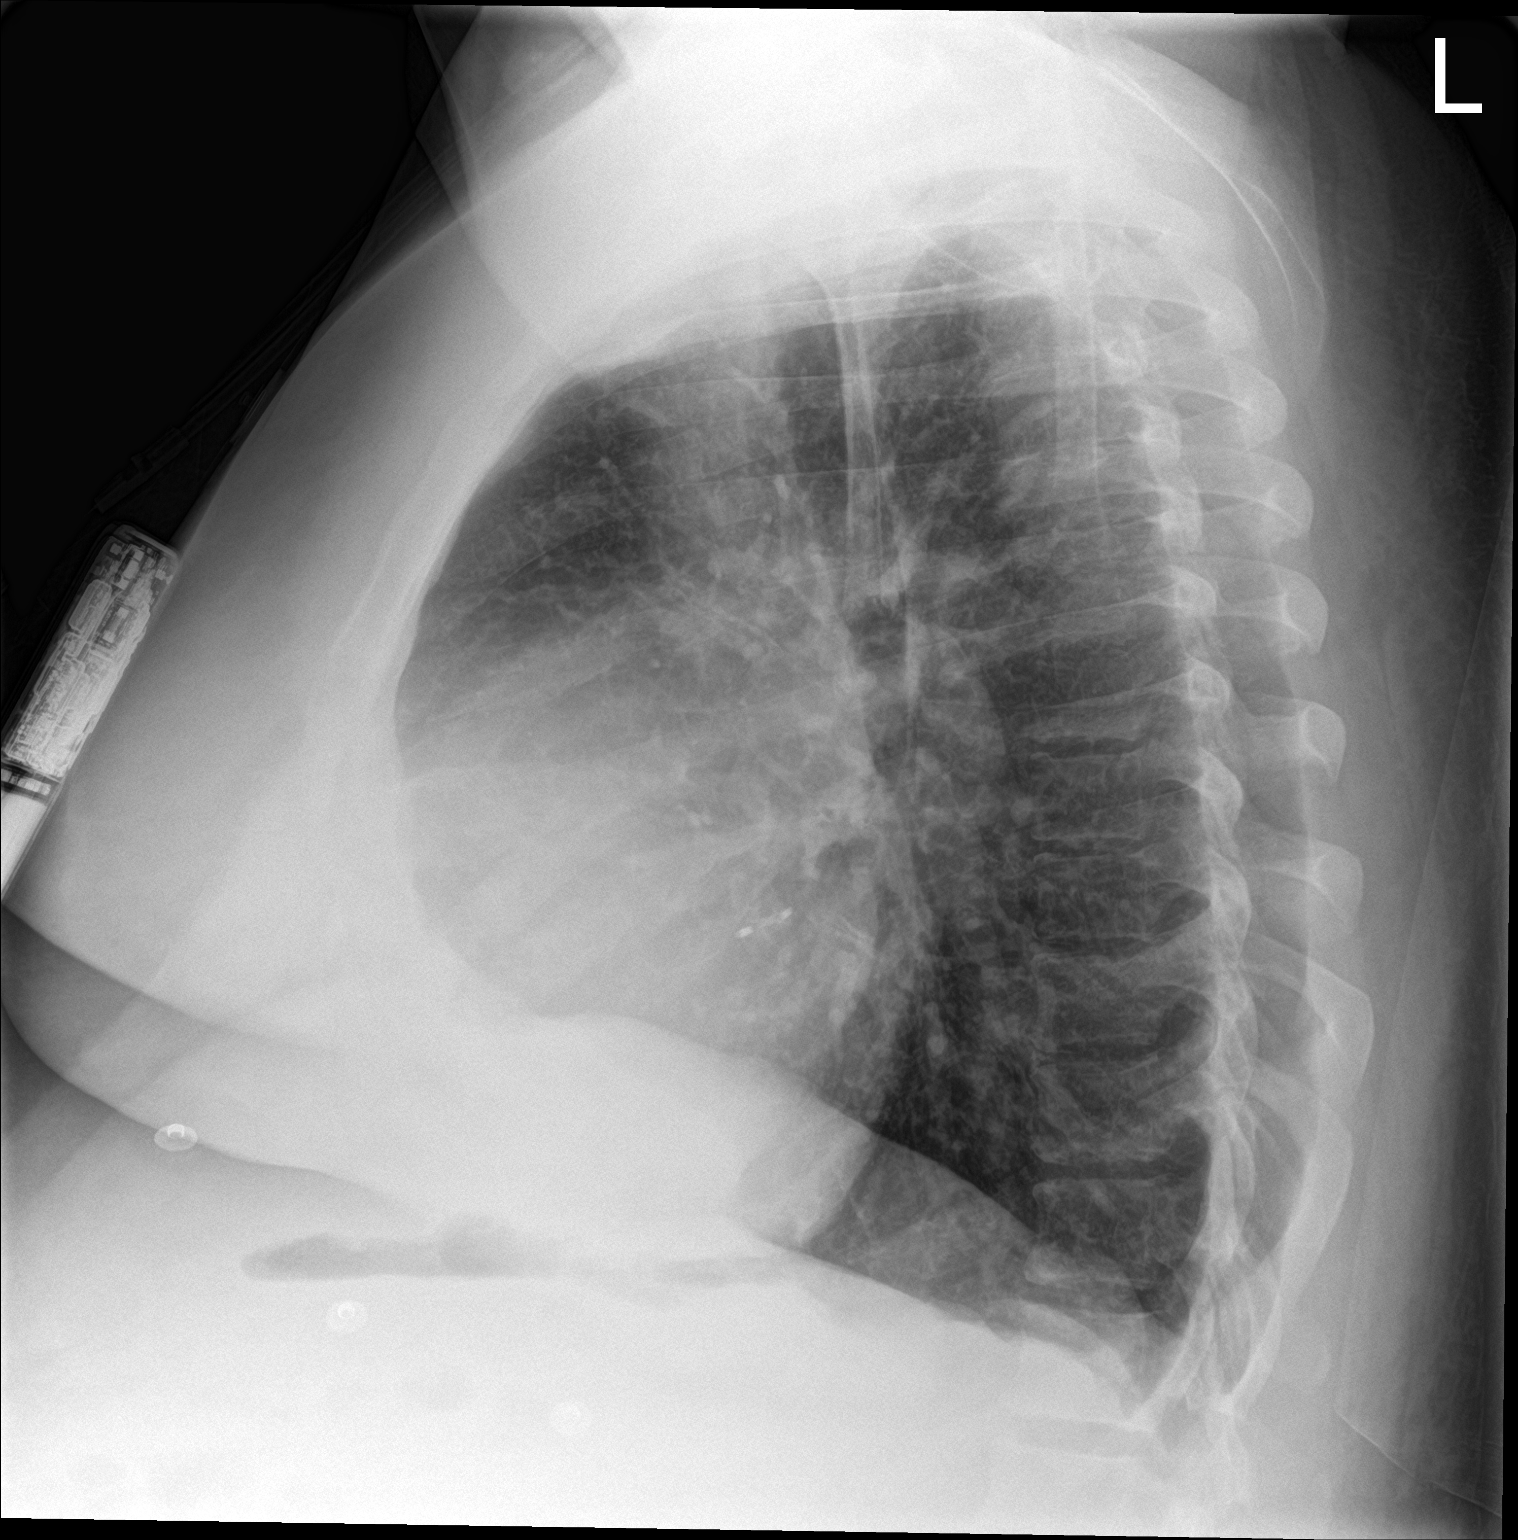

[2 of 2 positions shown; findings below may reference images not displayed]

FINDINGS: Marked cardiomegaly. Masslike structure right hilum most compatible
with enlarged pulmonary artery. No large area of pulmonary
consolidation. No pleural effusion or pneumothorax. Osseous
structures unremarkable.
IMPRESSION: 1. Cardiomegaly.
2. Enlarged pulmonary artery.
